# Patient Record
Sex: Male | Born: 1945
Health system: Southern US, Community
[De-identification: ages and names within clinical notes are randomized; demographics above are authoritative.]

## PROBLEM LIST (undated history)

## (undated) DIAGNOSIS — I2510 Atherosclerotic heart disease of native coronary artery without angina pectoris: Secondary | ICD-10-CM

## (undated) DIAGNOSIS — D649 Anemia, unspecified: Secondary | ICD-10-CM

## (undated) DIAGNOSIS — F1010 Alcohol abuse, uncomplicated: Secondary | ICD-10-CM

## (undated) DIAGNOSIS — F32A Depression, unspecified: Secondary | ICD-10-CM

## (undated) DIAGNOSIS — R51 Headache: Secondary | ICD-10-CM

## (undated) DIAGNOSIS — I471 Supraventricular tachycardia, unspecified: Secondary | ICD-10-CM

## (undated) DIAGNOSIS — M1990 Unspecified osteoarthritis, unspecified site: Secondary | ICD-10-CM

## (undated) DIAGNOSIS — R519 Headache, unspecified: Secondary | ICD-10-CM

## (undated) DIAGNOSIS — I77819 Aortic ectasia, unspecified site: Secondary | ICD-10-CM

## (undated) DIAGNOSIS — K635 Polyp of colon: Secondary | ICD-10-CM

## (undated) DIAGNOSIS — U071 COVID-19: Secondary | ICD-10-CM

## (undated) DIAGNOSIS — B1920 Unspecified viral hepatitis C without hepatic coma: Secondary | ICD-10-CM

## (undated) DIAGNOSIS — F4310 Post-traumatic stress disorder, unspecified: Secondary | ICD-10-CM

## (undated) DIAGNOSIS — K2970 Gastritis, unspecified, without bleeding: Secondary | ICD-10-CM

## (undated) DIAGNOSIS — I491 Atrial premature depolarization: Secondary | ICD-10-CM

## (undated) DIAGNOSIS — F319 Bipolar disorder, unspecified: Secondary | ICD-10-CM

## (undated) DIAGNOSIS — K861 Other chronic pancreatitis: Secondary | ICD-10-CM

## (undated) DIAGNOSIS — I509 Heart failure, unspecified: Secondary | ICD-10-CM

## (undated) DIAGNOSIS — I77810 Thoracic aortic ectasia: Secondary | ICD-10-CM

## (undated) DIAGNOSIS — F0390 Unspecified dementia without behavioral disturbance: Secondary | ICD-10-CM

## (undated) DIAGNOSIS — K219 Gastro-esophageal reflux disease without esophagitis: Secondary | ICD-10-CM

## (undated) DIAGNOSIS — C3490 Malignant neoplasm of unspecified part of unspecified bronchus or lung: Secondary | ICD-10-CM

## (undated) DIAGNOSIS — J449 Chronic obstructive pulmonary disease, unspecified: Secondary | ICD-10-CM

## (undated) DIAGNOSIS — K429 Umbilical hernia without obstruction or gangrene: Secondary | ICD-10-CM

## (undated) DIAGNOSIS — Z981 Arthrodesis status: Secondary | ICD-10-CM

## (undated) DIAGNOSIS — J349 Unspecified disorder of nose and nasal sinuses: Secondary | ICD-10-CM

## (undated) DIAGNOSIS — I5032 Chronic diastolic (congestive) heart failure: Secondary | ICD-10-CM

## (undated) DIAGNOSIS — F1990 Other psychoactive substance use, unspecified, uncomplicated: Secondary | ICD-10-CM

## (undated) DIAGNOSIS — K7460 Unspecified cirrhosis of liver: Secondary | ICD-10-CM

## (undated) DIAGNOSIS — F329 Major depressive disorder, single episode, unspecified: Secondary | ICD-10-CM

## (undated) DIAGNOSIS — N138 Other obstructive and reflux uropathy: Secondary | ICD-10-CM

## (undated) DIAGNOSIS — N529 Male erectile dysfunction, unspecified: Secondary | ICD-10-CM

## (undated) DIAGNOSIS — E785 Hyperlipidemia, unspecified: Secondary | ICD-10-CM

## (undated) DIAGNOSIS — N401 Enlarged prostate with lower urinary tract symptoms: Secondary | ICD-10-CM

## (undated) DIAGNOSIS — J189 Pneumonia, unspecified organism: Secondary | ICD-10-CM

## (undated) DIAGNOSIS — R0600 Dyspnea, unspecified: Secondary | ICD-10-CM

## (undated) DIAGNOSIS — R933 Abnormal findings on diagnostic imaging of other parts of digestive tract: Secondary | ICD-10-CM

## (undated) DIAGNOSIS — C61 Malignant neoplasm of prostate: Secondary | ICD-10-CM

## (undated) DIAGNOSIS — I340 Nonrheumatic mitral (valve) insufficiency: Secondary | ICD-10-CM

## (undated) DIAGNOSIS — K4021 Bilateral inguinal hernia, without obstruction or gangrene, recurrent: Secondary | ICD-10-CM

## (undated) DIAGNOSIS — K859 Acute pancreatitis without necrosis or infection, unspecified: Secondary | ICD-10-CM

## (undated) DIAGNOSIS — I10 Essential (primary) hypertension: Secondary | ICD-10-CM

## (undated) DIAGNOSIS — S2239XA Fracture of one rib, unspecified side, initial encounter for closed fracture: Secondary | ICD-10-CM

## (undated) DIAGNOSIS — N3941 Urge incontinence: Secondary | ICD-10-CM

## (undated) DIAGNOSIS — K5790 Diverticulosis of intestine, part unspecified, without perforation or abscess without bleeding: Secondary | ICD-10-CM

## (undated) DIAGNOSIS — K5792 Diverticulitis of intestine, part unspecified, without perforation or abscess without bleeding: Secondary | ICD-10-CM

## (undated) DIAGNOSIS — C801 Malignant (primary) neoplasm, unspecified: Secondary | ICD-10-CM

## (undated) HISTORY — DX: Diverticulitis of intestine, part unspecified, without perforation or abscess without bleeding: K57.92

## (undated) HISTORY — DX: Unspecified disorder of nose and nasal sinuses: J34.9

## (undated) HISTORY — DX: Malignant neoplasm of prostate: C61

## (undated) HISTORY — DX: Supraventricular tachycardia, unspecified: I47.10

## (undated) HISTORY — DX: Polyp of colon: K63.5

## (undated) HISTORY — DX: Urge incontinence: N39.41

## (undated) HISTORY — DX: Heart failure, unspecified: I50.9

## (undated) HISTORY — DX: Male erectile dysfunction, unspecified: N52.9

## (undated) HISTORY — DX: Benign prostatic hyperplasia with lower urinary tract symptoms: N40.1

## (undated) HISTORY — DX: Abnormal findings on diagnostic imaging of other parts of digestive tract: R93.3

## (undated) HISTORY — DX: Other obstructive and reflux uropathy: N13.8

## (undated) HISTORY — DX: COVID-19: U07.1

## (undated) HISTORY — DX: Malignant neoplasm of unspecified part of unspecified bronchus or lung: C34.90

## (undated) HISTORY — DX: Thoracic aortic ectasia: I77.810

## (undated) HISTORY — DX: Acute pancreatitis without necrosis or infection, unspecified: K85.90

## (undated) HISTORY — DX: Bipolar disorder, unspecified: F31.9

## (undated) HISTORY — DX: Atrial premature depolarization: I49.1

## (undated) HISTORY — PX: TONSILLECTOMY: SUR1361

## (undated) HISTORY — DX: Bilateral inguinal hernia, without obstruction or gangrene, recurrent: K40.21

## (undated) HISTORY — DX: Gastritis, unspecified, without bleeding: K29.70

## (undated) HISTORY — DX: Aortic ectasia, unspecified site: I77.819

## (undated) HISTORY — DX: Supraventricular tachycardia: I47.1

## (undated) HISTORY — DX: Post-traumatic stress disorder, unspecified: F43.10

## (undated) HISTORY — DX: Pneumonia, unspecified organism: J18.9

## (undated) HISTORY — PX: LUMBAR FUSION: SHX111

## (undated) HISTORY — DX: Umbilical hernia without obstruction or gangrene: K42.9

## (undated) HISTORY — DX: Chronic diastolic (congestive) heart failure: I50.32

## (undated) HISTORY — DX: Fracture of one rib, unspecified side, initial encounter for closed fracture: S22.39XA

---

## 1998-02-16 ENCOUNTER — Ambulatory Visit (HOSPITAL_COMMUNITY): Admission: RE | Admit: 1998-02-16 | Discharge: 1998-02-16 | Payer: Self-pay | Admitting: Cardiovascular Disease

## 1998-03-08 ENCOUNTER — Ambulatory Visit (HOSPITAL_COMMUNITY): Admission: RE | Admit: 1998-03-08 | Discharge: 1998-03-08 | Payer: Self-pay | Admitting: Gastroenterology

## 2001-11-18 ENCOUNTER — Emergency Department (HOSPITAL_COMMUNITY): Admission: EM | Admit: 2001-11-18 | Discharge: 2001-11-18 | Payer: Self-pay | Admitting: Emergency Medicine

## 2002-02-12 ENCOUNTER — Encounter: Payer: Self-pay | Admitting: Emergency Medicine

## 2002-02-12 ENCOUNTER — Emergency Department (HOSPITAL_COMMUNITY): Admission: EM | Admit: 2002-02-12 | Discharge: 2002-02-12 | Payer: Self-pay | Admitting: Emergency Medicine

## 2002-09-06 ENCOUNTER — Encounter: Payer: Self-pay | Admitting: Emergency Medicine

## 2002-09-06 ENCOUNTER — Emergency Department (HOSPITAL_COMMUNITY): Admission: EM | Admit: 2002-09-06 | Discharge: 2002-09-07 | Payer: Self-pay | Admitting: Emergency Medicine

## 2004-06-27 ENCOUNTER — Emergency Department: Payer: Self-pay | Admitting: Emergency Medicine

## 2004-07-28 HISTORY — PX: CORONARY ANGIOPLASTY: SHX604

## 2004-07-28 HISTORY — PX: CARDIAC CATHETERIZATION: SHX172

## 2004-07-28 HISTORY — PX: CORONARY STENT PLACEMENT: SHX1402

## 2004-12-05 ENCOUNTER — Emergency Department: Payer: Self-pay | Admitting: Emergency Medicine

## 2005-02-21 ENCOUNTER — Ambulatory Visit: Payer: Self-pay | Admitting: Cardiology

## 2005-02-24 ENCOUNTER — Inpatient Hospital Stay: Payer: Self-pay | Admitting: Cardiology

## 2005-02-24 ENCOUNTER — Other Ambulatory Visit: Payer: Self-pay

## 2005-03-31 ENCOUNTER — Emergency Department: Payer: Self-pay | Admitting: Emergency Medicine

## 2005-04-22 ENCOUNTER — Ambulatory Visit: Payer: Self-pay | Admitting: Family Medicine

## 2005-08-05 ENCOUNTER — Emergency Department: Payer: Self-pay | Admitting: Unknown Physician Specialty

## 2005-08-21 ENCOUNTER — Ambulatory Visit: Payer: Self-pay | Admitting: Gastroenterology

## 2005-11-12 ENCOUNTER — Ambulatory Visit: Payer: Self-pay | Admitting: Psychiatry

## 2005-11-12 ENCOUNTER — Inpatient Hospital Stay (HOSPITAL_COMMUNITY): Admission: RE | Admit: 2005-11-12 | Discharge: 2005-11-20 | Payer: Self-pay | Admitting: Psychiatry

## 2010-01-28 ENCOUNTER — Emergency Department: Payer: Self-pay | Admitting: Emergency Medicine

## 2010-03-06 ENCOUNTER — Inpatient Hospital Stay: Payer: Self-pay | Admitting: Internal Medicine

## 2010-04-25 ENCOUNTER — Emergency Department: Payer: Self-pay | Admitting: Emergency Medicine

## 2010-05-07 ENCOUNTER — Ambulatory Visit: Payer: Self-pay | Admitting: Emergency Medicine

## 2010-06-07 ENCOUNTER — Ambulatory Visit: Payer: Self-pay | Admitting: Cardiovascular Disease

## 2010-07-18 ENCOUNTER — Ambulatory Visit: Payer: Self-pay | Admitting: Pain Medicine

## 2011-07-04 ENCOUNTER — Emergency Department: Payer: Self-pay | Admitting: Emergency Medicine

## 2011-07-29 HISTORY — PX: OTHER SURGICAL HISTORY: SHX169

## 2011-12-16 ENCOUNTER — Ambulatory Visit: Payer: Self-pay | Admitting: Ophthalmology

## 2011-12-16 DIAGNOSIS — I499 Cardiac arrhythmia, unspecified: Secondary | ICD-10-CM

## 2012-01-06 ENCOUNTER — Ambulatory Visit: Payer: Self-pay | Admitting: Ophthalmology

## 2013-06-15 ENCOUNTER — Ambulatory Visit: Payer: Self-pay | Admitting: Ophthalmology

## 2013-06-15 DIAGNOSIS — I10 Essential (primary) hypertension: Secondary | ICD-10-CM

## 2013-06-28 ENCOUNTER — Ambulatory Visit: Payer: Self-pay | Admitting: Ophthalmology

## 2013-07-28 HISTORY — PX: HERNIA REPAIR: SHX51

## 2014-07-27 ENCOUNTER — Emergency Department (HOSPITAL_COMMUNITY): Payer: Medicare PPO

## 2014-07-27 ENCOUNTER — Encounter (HOSPITAL_COMMUNITY): Payer: Self-pay

## 2014-07-27 ENCOUNTER — Inpatient Hospital Stay (HOSPITAL_COMMUNITY)
Admission: EM | Admit: 2014-07-27 | Discharge: 2014-07-30 | DRG: 917 | Disposition: A | Discharge status: Home / Self Care | Payer: Medicare PPO | Attending: Internal Medicine | Admitting: Internal Medicine

## 2014-07-27 DIAGNOSIS — E872 Acidosis, unspecified: Secondary | ICD-10-CM

## 2014-07-27 DIAGNOSIS — I493 Ventricular premature depolarization: Secondary | ICD-10-CM | POA: Diagnosis present

## 2014-07-27 DIAGNOSIS — Z79899 Other long term (current) drug therapy: Secondary | ICD-10-CM | POA: Diagnosis not present

## 2014-07-27 DIAGNOSIS — E875 Hyperkalemia: Secondary | ICD-10-CM | POA: Diagnosis present

## 2014-07-27 DIAGNOSIS — F1910 Other psychoactive substance abuse, uncomplicated: Secondary | ICD-10-CM | POA: Diagnosis present

## 2014-07-27 DIAGNOSIS — I248 Other forms of acute ischemic heart disease: Secondary | ICD-10-CM | POA: Diagnosis present

## 2014-07-27 DIAGNOSIS — Z955 Presence of coronary angioplasty implant and graft: Secondary | ICD-10-CM

## 2014-07-27 DIAGNOSIS — R404 Transient alteration of awareness: Secondary | ICD-10-CM | POA: Diagnosis not present

## 2014-07-27 DIAGNOSIS — G43909 Migraine, unspecified, not intractable, without status migrainosus: Secondary | ICD-10-CM | POA: Diagnosis not present

## 2014-07-27 DIAGNOSIS — Z981 Arthrodesis status: Secondary | ICD-10-CM

## 2014-07-27 DIAGNOSIS — N179 Acute kidney failure, unspecified: Secondary | ICD-10-CM | POA: Diagnosis present

## 2014-07-27 DIAGNOSIS — T401X1A Poisoning by heroin, accidental (unintentional), initial encounter: Principal | ICD-10-CM | POA: Diagnosis present

## 2014-07-27 DIAGNOSIS — M6282 Rhabdomyolysis: Secondary | ICD-10-CM | POA: Diagnosis present

## 2014-07-27 DIAGNOSIS — F32A Depression, unspecified: Secondary | ICD-10-CM

## 2014-07-27 DIAGNOSIS — J9601 Acute respiratory failure with hypoxia: Secondary | ICD-10-CM | POA: Diagnosis present

## 2014-07-27 DIAGNOSIS — I2510 Atherosclerotic heart disease of native coronary artery without angina pectoris: Secondary | ICD-10-CM | POA: Diagnosis present

## 2014-07-27 DIAGNOSIS — G9341 Metabolic encephalopathy: Secondary | ICD-10-CM | POA: Diagnosis present

## 2014-07-27 DIAGNOSIS — Z87891 Personal history of nicotine dependence: Secondary | ICD-10-CM

## 2014-07-27 DIAGNOSIS — F10180 Alcohol abuse with alcohol-induced anxiety disorder: Secondary | ICD-10-CM | POA: Diagnosis present

## 2014-07-27 DIAGNOSIS — R4189 Other symptoms and signs involving cognitive functions and awareness: Secondary | ICD-10-CM | POA: Insufficient documentation

## 2014-07-27 DIAGNOSIS — F329 Major depressive disorder, single episode, unspecified: Secondary | ICD-10-CM | POA: Diagnosis present

## 2014-07-27 DIAGNOSIS — K219 Gastro-esophageal reflux disease without esophagitis: Secondary | ICD-10-CM

## 2014-07-27 DIAGNOSIS — J690 Pneumonitis due to inhalation of food and vomit: Secondary | ICD-10-CM | POA: Diagnosis present

## 2014-07-27 DIAGNOSIS — Z8249 Family history of ischemic heart disease and other diseases of the circulatory system: Secondary | ICD-10-CM

## 2014-07-27 DIAGNOSIS — J449 Chronic obstructive pulmonary disease, unspecified: Secondary | ICD-10-CM

## 2014-07-27 DIAGNOSIS — R7989 Other specified abnormal findings of blood chemistry: Secondary | ICD-10-CM

## 2014-07-27 DIAGNOSIS — R079 Chest pain, unspecified: Secondary | ICD-10-CM

## 2014-07-27 DIAGNOSIS — R778 Other specified abnormalities of plasma proteins: Secondary | ICD-10-CM

## 2014-07-27 DIAGNOSIS — F419 Anxiety disorder, unspecified: Secondary | ICD-10-CM

## 2014-07-27 HISTORY — DX: Depression, unspecified: F32.A

## 2014-07-27 HISTORY — DX: Atherosclerotic heart disease of native coronary artery without angina pectoris: I25.10

## 2014-07-27 HISTORY — DX: Chronic obstructive pulmonary disease, unspecified: J44.9

## 2014-07-27 HISTORY — DX: Alcohol abuse, uncomplicated: F10.10

## 2014-07-27 HISTORY — DX: Arthrodesis status: Z98.1

## 2014-07-27 HISTORY — DX: Other psychoactive substance use, unspecified, uncomplicated: F19.90

## 2014-07-27 HISTORY — DX: Major depressive disorder, single episode, unspecified: F32.9

## 2014-07-27 LAB — LACTIC ACID, PLASMA
LACTIC ACID, VENOUS: 8 mmol/L — AB (ref 0.5–2.2)
LACTIC ACID, VENOUS: 1.9 mmol/L (ref 0.5–2.2)
LACTIC ACID, VENOUS: 1.4 mmol/L (ref 0.5–2.2)
Lactic Acid, Venous: 1.1 mmol/L (ref 0.5–2.2)
Lactic Acid, Venous: 1.7 mmol/L (ref 0.5–2.2)

## 2014-07-27 LAB — POCT I-STAT 3, ART BLOOD GAS (G3+)
Acid-base deficit: 6 mmol/L — ABNORMAL HIGH (ref 0.0–2.0)
Bicarbonate: 21.3 mEq/L (ref 20.0–24.0)
O2 Saturation: 92 %
PH ART: 7.236 — AB (ref 7.350–7.450)
TCO2: 23 mmol/L (ref 0–100)
pCO2 arterial: 50.3 mmHg — ABNORMAL HIGH (ref 35.0–45.0)
pO2, Arterial: 78 mmHg — ABNORMAL LOW (ref 80.0–100.0)

## 2014-07-27 LAB — CBC
HCT: 39.9 % (ref 39.0–52.0)
Hemoglobin: 12.9 g/dL — ABNORMAL LOW (ref 13.0–17.0)
MCH: 32.7 pg (ref 26.0–34.0)
MCHC: 32.3 g/dL (ref 30.0–36.0)
MCV: 101.3 fL — AB (ref 78.0–100.0)
PLATELETS: 271 10*3/uL (ref 150–400)
RBC: 3.94 MIL/uL — ABNORMAL LOW (ref 4.22–5.81)
RDW: 14.3 % (ref 11.5–15.5)
WBC: 15.4 10*3/uL — ABNORMAL HIGH (ref 4.0–10.5)

## 2014-07-27 LAB — COMPREHENSIVE METABOLIC PANEL
ALBUMIN: 4.2 g/dL (ref 3.5–5.2)
ALK PHOS: 112 U/L (ref 39–117)
ALT: 23 U/L (ref 0–53)
ANION GAP: 14 (ref 5–15)
AST: 47 U/L — ABNORMAL HIGH (ref 0–37)
BILIRUBIN TOTAL: 0.3 mg/dL (ref 0.3–1.2)
BUN: 16 mg/dL (ref 6–23)
CALCIUM: 9 mg/dL (ref 8.4–10.5)
CHLORIDE: 106 meq/L (ref 96–112)
CO2: 22 mmol/L (ref 19–32)
Creatinine, Ser: 2.78 mg/dL — ABNORMAL HIGH (ref 0.50–1.35)
GFR calc Af Amer: 26 mL/min — ABNORMAL LOW (ref >90)
GFR, EST NON AFRICAN AMERICAN: 22 mL/min — AB (ref >90)
Glucose, Bld: 143 mg/dL — ABNORMAL HIGH (ref 70–99)
Potassium: 5.6 mmol/L — ABNORMAL HIGH (ref 3.5–5.1)
Sodium: 142 mmol/L (ref 135–145)
Total Protein: 7.9 g/dL (ref 6.0–8.3)

## 2014-07-27 LAB — URINALYSIS, ROUTINE W REFLEX MICROSCOPIC
Bilirubin Urine: NEGATIVE
Glucose, UA: NEGATIVE mg/dL
Ketones, ur: NEGATIVE mg/dL
Leukocytes, UA: NEGATIVE
NITRITE: NEGATIVE
PH: 5 (ref 5.0–8.0)
Protein, ur: 100 mg/dL — AB
Specific Gravity, Urine: 1.015 (ref 1.005–1.030)
UROBILINOGEN UA: 0.2 mg/dL (ref 0.0–1.0)

## 2014-07-27 LAB — CBC WITH DIFFERENTIAL/PLATELET
Basophils Absolute: 0 10*3/uL (ref 0.0–0.1)
Basophils Relative: 0 % (ref 0–1)
Eosinophils Absolute: 0 10*3/uL (ref 0.0–0.7)
Eosinophils Relative: 0 % (ref 0–5)
HCT: 42.2 % (ref 39.0–52.0)
Hemoglobin: 13.4 g/dL (ref 13.0–17.0)
LYMPHS ABS: 1.6 10*3/uL (ref 0.7–4.0)
Lymphocytes Relative: 9 % — ABNORMAL LOW (ref 12–46)
MCH: 32.5 pg (ref 26.0–34.0)
MCHC: 31.8 g/dL (ref 30.0–36.0)
MCV: 102.4 fL — ABNORMAL HIGH (ref 78.0–100.0)
MONO ABS: 1 10*3/uL (ref 0.1–1.0)
Monocytes Relative: 5 % (ref 3–12)
NEUTROS ABS: 16.3 10*3/uL — AB (ref 1.7–7.7)
Neutrophils Relative %: 86 % — ABNORMAL HIGH (ref 43–77)
PLATELETS: 281 10*3/uL (ref 150–400)
RBC: 4.12 MIL/uL — AB (ref 4.22–5.81)
RDW: 14.6 % (ref 11.5–15.5)
WBC: 19 10*3/uL — ABNORMAL HIGH (ref 4.0–10.5)

## 2014-07-27 LAB — BASIC METABOLIC PANEL
ANION GAP: 4 — AB (ref 5–15)
Anion gap: 10 (ref 5–15)
Anion gap: 9 (ref 5–15)
BUN: 22 mg/dL (ref 6–23)
BUN: 22 mg/dL (ref 6–23)
BUN: 23 mg/dL (ref 6–23)
CALCIUM: 8.4 mg/dL (ref 8.4–10.5)
CALCIUM: 8.3 mg/dL — AB (ref 8.4–10.5)
CALCIUM: 8.3 mg/dL — AB (ref 8.4–10.5)
CHLORIDE: 109 meq/L (ref 96–112)
CHLORIDE: 110 meq/L (ref 96–112)
CO2: 21 mmol/L (ref 19–32)
CO2: 23 mmol/L (ref 19–32)
CO2: 24 mmol/L (ref 19–32)
CREATININE: 1.96 mg/dL — AB (ref 0.50–1.35)
Chloride: 108 mEq/L (ref 96–112)
Creatinine, Ser: 2.58 mg/dL — ABNORMAL HIGH (ref 0.50–1.35)
Creatinine, Ser: 2.38 mg/dL — ABNORMAL HIGH (ref 0.50–1.35)
GFR calc non Af Amer: 34 mL/min — ABNORMAL LOW (ref >90)
GFR, EST AFRICAN AMERICAN: 28 mL/min — AB (ref >90)
GFR, EST AFRICAN AMERICAN: 31 mL/min — AB (ref >90)
GFR, EST AFRICAN AMERICAN: 39 mL/min — AB (ref >90)
GFR, EST NON AFRICAN AMERICAN: 24 mL/min — AB (ref >90)
GFR, EST NON AFRICAN AMERICAN: 27 mL/min — AB (ref >90)
GLUCOSE: 107 mg/dL — AB (ref 70–99)
Glucose, Bld: 103 mg/dL — ABNORMAL HIGH (ref 70–99)
Glucose, Bld: 103 mg/dL — ABNORMAL HIGH (ref 70–99)
POTASSIUM: 4.6 mmol/L (ref 3.5–5.1)
Potassium: 4.9 mmol/L (ref 3.5–5.1)
Potassium: 4 mmol/L (ref 3.5–5.1)
SODIUM: 140 mmol/L (ref 135–145)
SODIUM: 140 mmol/L (ref 135–145)
SODIUM: 138 mmol/L (ref 135–145)

## 2014-07-27 LAB — TROPONIN I
TROPONIN I: 0.54 ng/mL — AB (ref <0.031)
Troponin I: 0.31 ng/mL — ABNORMAL HIGH (ref <0.031)
Troponin I: 0.65 ng/mL (ref <0.031)

## 2014-07-27 LAB — RAPID URINE DRUG SCREEN, HOSP PERFORMED
Amphetamines: NOT DETECTED
BARBITURATES: NOT DETECTED
Benzodiazepines: POSITIVE — AB
Cocaine: NOT DETECTED
Opiates: POSITIVE — AB
Tetrahydrocannabinol: POSITIVE — AB

## 2014-07-27 LAB — URINE MICROSCOPIC-ADD ON

## 2014-07-27 LAB — GLUCOSE, CAPILLARY
GLUCOSE-CAPILLARY: 82 mg/dL (ref 70–99)
GLUCOSE-CAPILLARY: 97 mg/dL (ref 70–99)
Glucose-Capillary: 95 mg/dL (ref 70–99)
Glucose-Capillary: 108 mg/dL — ABNORMAL HIGH (ref 70–99)

## 2014-07-27 LAB — HIV ANTIBODY (ROUTINE TESTING W REFLEX): HIV: NONREACTIVE

## 2014-07-27 LAB — I-STAT ARTERIAL BLOOD GAS, ED
Acid-base deficit: 8 mmol/L — ABNORMAL HIGH (ref 0.0–2.0)
BICARBONATE: 21.3 meq/L (ref 20.0–24.0)
O2 Saturation: 88 %
PH ART: 7.178 — AB (ref 7.350–7.450)
PO2 ART: 69 mmHg — AB (ref 80.0–100.0)
Patient temperature: 98.6
TCO2: 23 mmol/L (ref 0–100)
pCO2 arterial: 57.3 mmHg (ref 35.0–45.0)

## 2014-07-27 LAB — ACETAMINOPHEN LEVEL

## 2014-07-27 LAB — CREATININE, SERUM
CREATININE: 2.74 mg/dL — AB (ref 0.50–1.35)
GFR calc Af Amer: 26 mL/min — ABNORMAL LOW (ref >90)
GFR calc non Af Amer: 22 mL/min — ABNORMAL LOW (ref >90)

## 2014-07-27 LAB — I-STAT TROPONIN, ED: TROPONIN I, POC: 0.13 ng/mL — AB (ref 0.00–0.08)

## 2014-07-27 LAB — ETHANOL: Alcohol, Ethyl (B): <5 mg/dL (ref 0–9)

## 2014-07-27 LAB — CK: Total CK: 4374 U/L — ABNORMAL HIGH (ref 7–232)

## 2014-07-27 LAB — MRSA PCR SCREENING: MRSA by PCR: NEGATIVE

## 2014-07-27 LAB — PROCALCITONIN: Procalcitonin: 1.61 ng/mL

## 2014-07-27 LAB — SALICYLATE LEVEL: Salicylate Lvl: <4 mg/dL (ref 2.8–20.0)

## 2014-07-27 MED ORDER — SODIUM CHLORIDE 0.9 % IV SOLN
1.0000 g | Freq: Once | INTRAVENOUS | Status: AC
  Administered 2014-07-27: 1 g via INTRAVENOUS
  Filled 2014-07-27: qty 10

## 2014-07-27 MED ORDER — INSULIN ASPART 100 UNIT/ML ~~LOC~~ SOLN: 10.0000 [IU] | Freq: Once | SUBCUTANEOUS | Status: DC

## 2014-07-27 MED ORDER — DEXTROSE 5 % IV SOLN
1.0000 g | Freq: Once | INTRAVENOUS | Status: AC
  Administered 2014-07-27: 1 g via INTRAVENOUS
  Filled 2014-07-27: qty 10

## 2014-07-27 MED ORDER — LEVOFLOXACIN IN D5W 750 MG/150ML IV SOLN
750.0000 mg | Freq: Once | INTRAVENOUS | Status: DC
  Filled 2014-07-27: qty 150

## 2014-07-27 MED ORDER — THIAMINE HCL 100 MG/ML IJ SOLN
100.0000 mg | Freq: Every day | INTRAMUSCULAR | Status: DC
  Administered 2014-07-27 – 2014-07-29 (×3): 100 mg via INTRAVENOUS
  Filled 2014-07-27 (×4): qty 1

## 2014-07-27 MED ORDER — ACETAMINOPHEN 500 MG PO TABS
1000.0000 mg | ORAL_TABLET | Freq: Once | ORAL | Status: AC
  Administered 2014-07-27: 1000 mg via ORAL

## 2014-07-27 MED ORDER — PANTOPRAZOLE SODIUM 40 MG IV SOLR
40.0000 mg | INTRAVENOUS | Status: DC
  Administered 2014-07-27 – 2014-07-28 (×2): 40 mg via INTRAVENOUS
  Filled 2014-07-27 (×3): qty 40

## 2014-07-27 MED ORDER — PIPERACILLIN-TAZOBACTAM 3.375 G IVPB
3.3750 g | Freq: Three times a day (TID) | INTRAVENOUS | Status: DC
  Administered 2014-07-27 – 2014-07-28 (×3): 3.375 g via INTRAVENOUS
  Filled 2014-07-27 (×5): qty 50

## 2014-07-27 MED ORDER — SODIUM CHLORIDE 0.9 % IV SOLN: 250.0000 mL | INTRAVENOUS | Status: DC | PRN

## 2014-07-27 MED ORDER — PIPERACILLIN-TAZOBACTAM 3.375 G IVPB 30 MIN
3.3750 g | Freq: Once | INTRAVENOUS | Status: AC
  Administered 2014-07-27: 3.375 g via INTRAVENOUS
  Filled 2014-07-27: qty 50

## 2014-07-27 MED ORDER — SODIUM CHLORIDE 0.9 % IV BOLUS (SEPSIS)
1000.0000 mL | Freq: Once | INTRAVENOUS | Status: AC
  Administered 2014-07-27: 1000 mL via INTRAVENOUS

## 2014-07-27 MED ORDER — DEXTROSE 50 % IV SOLN: 50.0000 mL | Freq: Once | INTRAVENOUS | Status: DC

## 2014-07-27 MED ORDER — CETYLPYRIDINIUM CHLORIDE 0.05 % MT LIQD
7.0000 mL | Freq: Two times a day (BID) | OROMUCOSAL | Status: DC
  Administered 2014-07-27 – 2014-07-29 (×5): 7 mL via OROMUCOSAL

## 2014-07-27 MED ORDER — SODIUM CHLORIDE 0.9 % IV BOLUS (SEPSIS): 1000.0000 mL | Freq: Once | INTRAVENOUS | Status: DC

## 2014-07-27 MED ORDER — SODIUM CHLORIDE 0.9 % IV SOLN
INTRAVENOUS | Status: DC
  Administered 2014-07-27 (×2): via INTRAVENOUS

## 2014-07-27 MED ORDER — FOLIC ACID 5 MG/ML IJ SOLN
1.0000 mg | Freq: Every day | INTRAMUSCULAR | Status: DC
  Administered 2014-07-27 – 2014-07-29 (×3): 1 mg via INTRAVENOUS
  Filled 2014-07-27 (×3): qty 0.2

## 2014-07-27 MED ORDER — HEPARIN SODIUM (PORCINE) 5000 UNIT/ML IJ SOLN
5000.0000 [IU] | Freq: Three times a day (TID) | INTRAMUSCULAR | Status: DC
  Administered 2014-07-27 – 2014-07-30 (×9): 5000 [IU] via SUBCUTANEOUS
  Filled 2014-07-27 (×11): qty 1

## 2014-07-27 NOTE — Care Management Note (Addendum)
    Page 1 of 1   07/28/2014     2:34:42 PM CARE MANAGEMENT NOTE 07/28/2014  Patient:  Christopher Orr, Christopher Orr   Account Number:  [id number]  Date Initiated:  07/27/2014  Documentation initiated by:  Elissa Hefty  Subjective/Objective Assessment:   adm w resp distress     Action/Plan:   lives at home pta   In-house referral  Clinical Social Worker      DC Forensic scientist  CM consult      Medicare Important Message given?   (If response is "NO", the following Medicare IM given date fields will be blank) Date Medicare IM given:   Medicare IM given by:   Date Additional Medicare IM given:   Additional Medicare IM given by:    Per UR Regulation:  Reviewed for med. necessity/level of care/duration of stay  Comments:  07/28/14 [place] Per RN, pt requested someone talk with him about his insurance.  Per pt, his new insurance went into effect this date, produced a letter verifying enrollment in [place].  Dtr will take letter to admitting so that correct payor will be entered into system.  Noted that medicaid was listed as secondary but pt states he no longer has MCD.  Dtr will also provide that information to admitting.

## 2014-07-27 NOTE — ED Notes (Signed)
The pt has stated his name and stated that he injected heroin tonight.

## 2014-07-27 NOTE — ED Notes (Signed)
Report called to 2100.

## 2014-07-27 NOTE — ED Notes (Signed)
Dr. Stark Jock notified of I stat troponin results

## 2014-07-27 NOTE — ED Notes (Signed)
Daughter at bedside , states she found him in his car tonight. Patient has a history of drug and alcohol abuse. Patient is currently alert  States he used heroin tonight. Daughter [name] has patient belongings pants, shirt, wallet no money, shoes and keys, no glasses with patient upon arrival. Daughter states his cell phone is home.

## 2014-07-27 NOTE — ED Notes (Signed)
Dr Stark Jock has ordered a liter of fluid wo

## 2014-07-27 NOTE — ED Provider Notes (Signed)
CSN: [id number]     Arrival date & time 07/27/14  0212 History   None    This chart was scribed for Christopher Speak, MD by Forrestine Him, ED Scribe. This patient was seen in room Christopher Orr and the patient's care was started 2:13 AM.   No chief complaint on file.  Patient is a 68 y.o. male presenting with altered mental status. The history is provided by the patient. No language interpreter was used.  Altered Mental Status Presenting symptoms: behavior changes and unresponsiveness   Severity:  Unable to specify Most recent episode:  Today Episode history:  Unable to specify Timing:  Constant Progression:  Unchanged Chronicity:  New   LEVEL 5 CAVEAT DUE TO UNRESPONSIVENESS   HPI Comments: Christopher Orr is a 68 y.o. male with a PMHx of polysubstance abuse who presents to the Emergency Department here after being found unresponsive in a car just prior to arrival. Per EMS, pts daughter was looking for pt and found him in the car. At that time, all windows in car were completely fogged. No evidence of alcohol consumption or illicit drug use found in the vehicle. Copious amount of sputum in throat, approximately 50 CC of mucous suctioned on the scene. In addition pt was found breathing approximately 4 times per minute. He was given IV Narcan.  No past medical history on file. No past surgical history on file. No family history on file. History  Substance Use Topics  . Smoking status: Not on file  . Smokeless tobacco: Not on file  . Alcohol Use: Not on file    Review of Systems  Unable to perform ROS: Patient unresponsive      Allergies  Review of patient's allergies indicates not on file.  Home Medications   Prior to Admission medications   Not on File   Triage Vitals: There were no vitals taken for this visit.   Physical Exam  Constitutional: He is oriented to person, place, and time. He appears well-developed and well-nourished.  Pt is 68 year old male who appears older  than stated age. He is moaning and not answering questions or following commands.  HENT:  Head: Normocephalic and atraumatic.  Eyes: EOM are normal.  Neck: Normal range of motion.  Cardiovascular: Normal rate, regular rhythm, normal heart sounds and intact distal pulses.   No murmur heard. Pulmonary/Chest: Effort normal and breath sounds normal. No respiratory distress. He has no wheezes. He has no rales.  Abdominal: Soft. He exhibits no distension. There is no tenderness.  Musculoskeletal: Normal range of motion. He exhibits no edema.  Neurological: He is alert and oriented to person, place, and time.  Pt is awake and moaning. He appears tremulous but moves all extremities. Does not follow commands appropriately  remainder of neurologic exam is difficult due to severity of medical condition.  Skin: Skin is warm and dry.  Psychiatric: He has a normal mood and affect. Judgment normal.  Nursing note and vitals reviewed.   ED Course  Procedures (including critical care time)  DIAGNOSTIC STUDIES: Oxygen Saturation is 86% on oxygen by nrb, low by my interpretation.    COORDINATION OF CARE: 2:22 AM-Discussed treatment plan with pt at bedside and pt agreed to plan.     Labs Review Labs Reviewed - No data to display  Imaging Review No results found.   Date: 07/27/2014  Rate: 109  Rhythm: normal sinus rhythm  QRS Axis: left  Intervals: normal  ST/T Wave abnormalities: nonspecific T wave  changes  Conduction Disutrbances:none  Narrative Interpretation:   Old EKG Reviewed: none available    MDM   Final diagnoses:  None    Patient brought by EMS after being found unresponsive in his car by his daughter. Patient has a history of drug abuse and admits to injecting heroin earlier this evening. He was given Narcan by EMS with little improvement in his mental status. He arrived here with snoring respirations and hypotensive. He was protecting his airway and was observed pending  the results of his workup.  His chest x-ray reveals what appears to be an aspiration pneumonitis and tox screen is positive for opiates, benzos, and marijuana. His found to have a mildly bumped troponin, significance of which I am uncertain. This may be more of a demand ischemia than a true acute coronary syndrome. His EKG does not show any acute changes. He underwent imaging of his head to rule out stroke which was negative.  In consultation with Dr. Donette Larry with critical care, an ABG was obtained which reveals a respiratory acidosis. Patient will be evaluated by critical care and will be admitted to their service. He has been more awake and alert and responsive to questions, however speech remains somewhat slurred and his chest remains congested.  He has been given antibiotics to cover for aspiration pneumonitis.  CRITICAL CARE Performed by: Christopher Orr Total critical care time: 45 minutes Critical care time was exclusive of separately billable procedures and treating other patients. Critical care was necessary to treat or prevent imminent or life-threatening deterioration. Critical care was time spent personally by me on the following activities: development of treatment plan with patient and/or surrogate as well as nursing, discussions with consultants, evaluation of patient's response to treatment, examination of patient, obtaining history from patient or surrogate, ordering and performing treatments and interventions, ordering and review of laboratory studies, ordering and review of radiographic studies, pulse oximetry and re-evaluation of patient's condition.   I personally performed the services described in this documentation, which was scribed in my presence. The recorded information has been reviewed and is accurate.    Christopher Speak, MD 07/27/14 [phone]

## 2014-07-27 NOTE — ED Notes (Signed)
Attempted report 

## 2014-07-27 NOTE — ED Notes (Signed)
Family  At triage

## 2014-07-27 NOTE — ED Notes (Signed)
Patient is alert oriented to name ,still states he doesn't remember what happened earlier tonight. Lungs are very congested sounding, patient will cough and it does sound a little clearer.

## 2014-07-27 NOTE — ED Notes (Signed)
The pt was found unresponsive in his car just pta.  He was breathing approx 4 times per minute.  He came around with iv narcan.,  On arrival with gems he is moaning  And not talking..  Iv per ems

## 2014-07-27 NOTE — ED Notes (Signed)
Daughter [name] [phone] (home)

## 2014-07-27 NOTE — ED Notes (Signed)
History of drug use for a long time per the paramedics who received this information from the family

## 2014-07-27 NOTE — ED Notes (Signed)
Transported  To Ct

## 2014-07-27 NOTE — H&P (Signed)
PULMONARY / CRITICAL CARE MEDICINE   Name: Christopher Orr MRN: [id number] DOB: 04-17-1946    ADMISSION DATE:  07/27/2014 CONSULTATION DATE:  07/27/2014  REFERRING MD :  EDP  CHIEF COMPLAINT:  AMS  INITIAL PRESENTATION:  68 y.o. M brought to [facility] ED early AM hours 12/31 after being found unresponsive in his car with respiratory rate of only 4.  In ED, imaging was negative, he received narcan and then proceeded to inform EDP that he injected heroin.  Lactate found to be 8.  PCCM asked to admit.  STUDIES:  CT Head 12/31 >>> negative CXR 12/31 >>> L > R basilar opacification  SIGNIFICANT EVENTS: 12/31 - found unresponsive in car, admitted to injecting heroin  HISTORY OF PRESENT ILLNESS:  Pt is encephalopathic; therefore, this HPI is obtained from chart review. Christopher Orr is a 68 y.o. M with unknown PMH.  He was brought to [facility] ED during early AM hours on 12/31 after he was found unresponsive in his car.  In ED, respiratory rate was only 4 and pt was moaning and groaning.  Family reported a long hx of drug and alcohol abuse.  He received IV narcan which helped and he later admitted that he had injected heroin earlier that night.  He was unable to recall any additional events.  In ED, initial workup c/w severe sepsis (hypotension, hypoxic, WBC 19, initial lactate 8).  CXR concerning for aspiration, Head CT negative.  PCCM was asked to admit.  Pt denies any recent fevers/chills/sweats, chest pain, SOB, abd pain, N/V/D.  He is somnolent but easily arouseable to voice.   PAST MEDICAL HISTORY :   has no past medical history on file.  has no past surgical history on file. Prior to Admission medications   Not on File   No Known Allergies  FAMILY HISTORY:  No family history on file.  SOCIAL HISTORY:  has no tobacco, alcohol, and drug history on file.  REVIEW OF SYSTEMS:   All negative; except for those that are bolded, which indicate positives.  Constitutional: weight loss, weight  gain, night sweats, fevers, chills, fatigue, weakness.  HEENT: headaches, sore throat, sneezing, nasal congestion, post nasal drip, difficulty swallowing, tooth/dental problems, visual complaints, visual changes, ear aches. Neuro: difficulty with speech, weakness, numbness, ataxia. CV:  chest pain, orthopnea, PND, swelling in lower extremities, dizziness, palpitations, syncope.  Resp: cough, hemoptysis, dyspnea, wheezing. GI  heartburn, indigestion, abdominal pain, nausea, vomiting, diarrhea, constipation, change in bowel habits, loss of appetite, hematemesis, melena, hematochezia.  GU: dysuria, change in color of urine, urgency or frequency, flank pain, hematuria. MSK: joint pain or swelling, decreased range of motion. Psych: change in mood or affect, depression, anxiety, suicidal ideations, homicidal ideations. Skin: rash, itching, bruising.   SUBJECTIVE:   VITAL SIGNS: Temp:  [97.7 F (36.5 C)] 97.7 F (36.5 C) (12/31 0221) Pulse Rate:  [91-108] 91 (12/31 0453) Resp:  [15-18] 18 (12/31 0453) BP: (80-106)/(57-72) 84/63 mmHg (12/31 0453) SpO2:  [86 %-95 %] 91 % (12/31 0453) HEMODYNAMICS:   VENTILATOR SETTINGS:   INTAKE / OUTPUT: Intake/Output      12/30 0701 - 12/31 0700   Urine 200   Total Output 200   Net -200         PHYSICAL EXAMINATION: General: WDWN male, in NAD. Neuro: Somnolent but easily arouseable.  Once aroused, is A&O x 3. HEENT: [place]/AT. PERRL, sclerae anicteric. Cardiovascular: RRR, no M/R/G.  Lungs: Respirations slow.  Coarse rhonchi L > R. Abdomen: BS  x 4, soft, NT/ND.  Musculoskeletal: No gross deformities, no edema.  Skin: Intact, warm, no rashes.  LABS:  CBC  Recent Labs Lab 07/27/14 0218  WBC 19.0*  HGB 13.4  HCT 42.2  PLT 281   Coag's No results for input(s): APTT, INR in the last 168 hours. BMET  Recent Labs Lab 07/27/14 0218  NA 142  K 5.6*  CL 106  CO2 22  BUN 16  CREATININE 2.78*  GLUCOSE 143*   Electrolytes  Recent  Labs Lab 07/27/14 0218  CALCIUM 9.0   Sepsis Markers  Recent Labs Lab 07/27/14 0218  LATICACIDVEN 8.0*   ABG No results for input(s): PHART, PCO2ART, PO2ART in the last 168 hours. Liver Enzymes  Recent Labs Lab 07/27/14 0218  AST 47*  ALT 23  ALKPHOS 112  BILITOT 0.3  ALBUMIN 4.2   Cardiac Enzymes No results for input(s): TROPONINI, PROBNP in the last 168 hours. Glucose No results for input(s): GLUCAP in the last 168 hours.  Imaging Ct Head Wo Contrast  07/27/2014   CLINICAL DATA:  Found unresponsive in vehicle around 1:30 a.m., altered mental status.  EXAM: CT HEAD WITHOUT CONTRAST  TECHNIQUE: Contiguous axial images were obtained from the base of the skull through the vertex without intravenous contrast.  COMPARISON:  None.  FINDINGS: The ventricles and sulci are normal for age. No intraparenchymal hemorrhage, mass effect nor midline shift. Patchy supratentorial white matter hypodensities are less than expected for patient's age and though non-specific suggest sequelae of chronic small vessel ischemic disease. No acute large vascular territory infarcts.  No abnormal extra-axial fluid collections. Basal cisterns are patent. Mild calcific atherosclerosis of the carotid siphons.  No skull fracture. 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 The included ocular globes and orbital contents are non-suspicious. While the frontal sinus mucosal thickening without paranasal sinus air-fluid levels. Soft tissue within the external auditory canals most consistent with cerumen.  IMPRESSION: No acute intracranial process ; normal noncontrast CT of the head for age.  Mild paranasal sinusitis.   Electronically Signed   By: Elon Alas   On: 07/27/2014 04:17   Dg Chest Portable 1 View  07/27/2014   CLINICAL DATA:  Unresponsive. Question drug overdose none currently available  EXAM: PORTABLE CHEST - 1 VIEW  COMPARISON:  None.  FINDINGS: Indistinct basilar opacities, left  greater than right. No edema. Aside from the excluded lateral left costophrenic sulcus, no effusion or pneumothorax. Normal heart size and mediastinal contours for technique (shadow inspiration and portable exam).  IMPRESSION: Left greater than right basilar lung opacification. History of unresponsiveness favors aspiration pneumonitis or pneumonia.   Electronically Signed   By: Jorje Guild M.D.   On: 07/27/2014 03:02    ASSESSMENT / PLAN:  NEUROLOGIC A:   Acute metabolic encephalopathy - improving, received narcan x 1 Polysubstance abuse - UDS positive for benzos, opiates, THC Hx ETOH abuse P:   Monitor clinically for signs of withdrawal. Thiamine, Folate. Heroin short acting, no narcan unless longer acting agent dicsovered  PULMONARY A: Acute hypoxic respiratory failure Respiratory Acidosis Probable aspiration PNA P:   Continue supplemental O2 as needed to maintain SpO2 > 92%. Abx per ID section. CXR in AM. NO BIPAP with asp risk Low threshold intubation, follow K and abg correction ph as it relates to K  CARDIOVASCULAR A:  Severe sepsis - initial lactate 8 Troponin leak - likely demand ischemia P:  IVF resuscitation (has received 2L, 3rd liter ordered). Trend troponin /  lactate. Monitor hemodynamics. May need line Await lactic clearance, clinically improving Ensure got 30 cc/kg volume  RENAL A:   AKI - unknown baseline Hyperkalemia R/o rhabdo - unknown downtime P:   NS @ 125. 1g calcium gluconate. 10u insulin. 1amp D50. Check CK. BMP in AM, awaited abg to ensure ph correction also Repeat lactic  GASTROINTESTINAL A:   Nutrition P:   NPO for now until more awake. ppi  HEMATOLOGIC A:   VTE Prophylaxis Macrocytosis P:  SCD's / Heparin. CBC in AM.  INFECTIOUS A:   Severe sepsis - etiology likely aspiration PNA P:   Sputum Cx 12/31 Abx: Zosyn, start date 12/31, day 1/x. BC 12/31>>>  PCT algo to limit Abx exposure  ENDOCRINE A:    Hyperglycemia P:   SSI if glucose consistently > 180.  Family updated: None.  Interdisciplinary Family Meeting v Palliative Care Meeting:  Due by: 01/06.   Montey Hora, [place] Pgr: [phone]  or [phone] 07/27/2014, 5:25 AM  STAFF NOTE: I, Merrie Roof, MD FACP have personally reviewed patient's available data, including medical history, events of note, physical examination and test results as part of my evaluation. I have discussed with resident/NP and other care providers such as pharmacist, RN and RRT. In addition, I personally evaluated patient and elicited key findings of: Improving clinically, more awake, BP wnl, repeat abg, k , lactic, may need line, continued resuscitation to goal 30 cc/kg bolus, zosyn, no narcan at this stage required, ETOH wd risk The patient is critically ill with multiple organ systems failure and requires high complexity decision making for assessment and support, frequent evaluation and titration of therapies, application of advanced monitoring technologies and extensive interpretation of multiple databases.   Critical Care Time devoted to patient care services described in this note is30 Minutes. This time reflects time of care of this signee: Merrie Roof, MD FACP. This critical care time does not reflect procedure time, or teaching time or supervisory time of PA/NP/Med student/Med Resident etc but could involve care discussion time. Rest per NP/medical resident whose note is outlined above and that I agree with   Lavon Paganini. Titus Mould, MD, [place] Pgr: [place] 07/27/2014 8:38 AM

## 2014-07-28 ENCOUNTER — Inpatient Hospital Stay (HOSPITAL_COMMUNITY): Payer: Medicare PPO

## 2014-07-28 ENCOUNTER — Encounter (HOSPITAL_COMMUNITY): Payer: Self-pay | Admitting: Physician Assistant

## 2014-07-28 DIAGNOSIS — R778 Other specified abnormalities of plasma proteins: Secondary | ICD-10-CM

## 2014-07-28 DIAGNOSIS — R079 Chest pain, unspecified: Secondary | ICD-10-CM

## 2014-07-28 DIAGNOSIS — R0789 Other chest pain: Secondary | ICD-10-CM

## 2014-07-28 DIAGNOSIS — N179 Acute kidney failure, unspecified: Secondary | ICD-10-CM

## 2014-07-28 DIAGNOSIS — J449 Chronic obstructive pulmonary disease, unspecified: Secondary | ICD-10-CM

## 2014-07-28 DIAGNOSIS — J690 Pneumonitis due to inhalation of food and vomit: Secondary | ICD-10-CM

## 2014-07-28 DIAGNOSIS — E872 Acidosis, unspecified: Secondary | ICD-10-CM

## 2014-07-28 DIAGNOSIS — R7989 Other specified abnormal findings of blood chemistry: Secondary | ICD-10-CM

## 2014-07-28 DIAGNOSIS — F329 Major depressive disorder, single episode, unspecified: Secondary | ICD-10-CM

## 2014-07-28 DIAGNOSIS — J9600 Acute respiratory failure, unspecified whether with hypoxia or hypercapnia: Secondary | ICD-10-CM

## 2014-07-28 DIAGNOSIS — F32A Depression, unspecified: Secondary | ICD-10-CM

## 2014-07-28 DIAGNOSIS — T401X1D Poisoning by heroin, accidental (unintentional), subsequent encounter: Secondary | ICD-10-CM

## 2014-07-28 DIAGNOSIS — K219 Gastro-esophageal reflux disease without esophagitis: Secondary | ICD-10-CM

## 2014-07-28 DIAGNOSIS — F419 Anxiety disorder, unspecified: Secondary | ICD-10-CM

## 2014-07-28 HISTORY — DX: Acute kidney failure, unspecified: N17.9

## 2014-07-28 HISTORY — DX: Pneumonitis due to inhalation of food and vomit: J69.0

## 2014-07-28 HISTORY — DX: Other specified abnormal findings of blood chemistry: R79.89

## 2014-07-28 HISTORY — DX: Chest pain, unspecified: R07.9

## 2014-07-28 LAB — CBC
HEMATOCRIT: 35.1 % — AB (ref 39.0–52.0)
Hemoglobin: 11.4 g/dL — ABNORMAL LOW (ref 13.0–17.0)
MCH: 31.5 pg (ref 26.0–34.0)
MCHC: 32.5 g/dL (ref 30.0–36.0)
MCV: 97 fL (ref 78.0–100.0)
PLATELETS: 225 10*3/uL (ref 150–400)
RBC: 3.62 MIL/uL — AB (ref 4.22–5.81)
RDW: 14.4 % (ref 11.5–15.5)
WBC: 12.4 10*3/uL — ABNORMAL HIGH (ref 4.0–10.5)

## 2014-07-28 LAB — BASIC METABOLIC PANEL
Anion gap: 9 (ref 5–15)
BUN: 19 mg/dL (ref 6–23)
CHLORIDE: 107 meq/L (ref 96–112)
CO2: 23 mmol/L (ref 19–32)
Calcium: 8.6 mg/dL (ref 8.4–10.5)
Creatinine, Ser: 1.68 mg/dL — ABNORMAL HIGH (ref 0.50–1.35)
GFR calc non Af Amer: 40 mL/min — ABNORMAL LOW (ref >90)
GFR, EST AFRICAN AMERICAN: 47 mL/min — AB (ref >90)
Glucose, Bld: 90 mg/dL (ref 70–99)
Potassium: 3.9 mmol/L (ref 3.5–5.1)
Sodium: 139 mmol/L (ref 135–145)

## 2014-07-28 LAB — EXPECTORATED SPUTUM ASSESSMENT W REFEX TO RESP CULTURE

## 2014-07-28 LAB — PHOSPHORUS: Phosphorus: 1.9 mg/dL — ABNORMAL LOW (ref 2.3–4.6)

## 2014-07-28 LAB — EXPECTORATED SPUTUM ASSESSMENT W GRAM STAIN, RFLX TO RESP C

## 2014-07-28 LAB — PROCALCITONIN: Procalcitonin: 1.15 ng/mL

## 2014-07-28 LAB — GLUCOSE, CAPILLARY
GLUCOSE-CAPILLARY: 116 mg/dL — AB (ref 70–99)
Glucose-Capillary: 117 mg/dL — ABNORMAL HIGH (ref 70–99)
Glucose-Capillary: 86 mg/dL (ref 70–99)
Glucose-Capillary: 88 mg/dL (ref 70–99)
Glucose-Capillary: 88 mg/dL (ref 70–99)

## 2014-07-28 LAB — TROPONIN I
Troponin I: 0.72 ng/mL (ref <0.031)
Troponin I: 0.32 ng/mL — ABNORMAL HIGH (ref <0.031)

## 2014-07-28 LAB — MAGNESIUM: Magnesium: 1.9 mg/dL (ref 1.5–2.5)

## 2014-07-28 MED ORDER — ACETAMINOPHEN 500 MG PO TABS
1000.0000 mg | ORAL_TABLET | Freq: Once | ORAL | Status: AC
  Administered 2014-07-28: 1000 mg via ORAL
  Filled 2014-07-28: qty 2

## 2014-07-28 MED ORDER — ROSUVASTATIN CALCIUM 5 MG PO TABS
5.0000 mg | ORAL_TABLET | Freq: Every day | ORAL | Status: DC
  Administered 2014-07-28 – 2014-07-29 (×2): 5 mg via ORAL
  Filled 2014-07-28 (×3): qty 1

## 2014-07-28 MED ORDER — IPRATROPIUM-ALBUTEROL 0.5-2.5 (3) MG/3ML IN SOLN
3.0000 mL | Freq: Four times a day (QID) | RESPIRATORY_TRACT | Status: DC
  Administered 2014-07-28 – 2014-07-29 (×3): 3 mL via RESPIRATORY_TRACT
  Filled 2014-07-28 (×3): qty 3

## 2014-07-28 MED ORDER — PRASUGREL HCL 10 MG PO TABS
10.0000 mg | ORAL_TABLET | Freq: Every day | ORAL | Status: DC
  Administered 2014-07-28 – 2014-07-30 (×3): 10 mg via ORAL
  Filled 2014-07-28 (×3): qty 1

## 2014-07-28 MED ORDER — FLUOXETINE HCL 10 MG PO CAPS
10.0000 mg | ORAL_CAPSULE | Freq: Every day | ORAL | Status: DC
  Administered 2014-07-28 – 2014-07-30 (×3): 10 mg via ORAL
  Filled 2014-07-28 (×3): qty 1

## 2014-07-28 MED ORDER — PANTOPRAZOLE SODIUM 40 MG PO TBEC
40.0000 mg | DELAYED_RELEASE_TABLET | Freq: Every day | ORAL | Status: DC
  Administered 2014-07-29 – 2014-07-30 (×2): 40 mg via ORAL
  Filled 2014-07-28 (×2): qty 1

## 2014-07-28 MED ORDER — ASPIRIN EC 81 MG PO TBEC
81.0000 mg | DELAYED_RELEASE_TABLET | Freq: Every day | ORAL | Status: DC
  Administered 2014-07-28 – 2014-07-30 (×3): 81 mg via ORAL
  Filled 2014-07-28 (×3): qty 1

## 2014-07-28 MED ORDER — FAMOTIDINE 20 MG PO TABS
20.0000 mg | ORAL_TABLET | Freq: Every day | ORAL | Status: DC
  Administered 2014-07-28 – 2014-07-30 (×4): 20 mg via ORAL
  Filled 2014-07-28 (×3): qty 1

## 2014-07-28 MED ORDER — VITAMIN C 250 MG PO TABS
250.0000 mg | ORAL_TABLET | Freq: Every day | ORAL | Status: DC
  Administered 2014-07-28 – 2014-07-30 (×3): 250 mg via ORAL
  Filled 2014-07-28 (×3): qty 1

## 2014-07-28 MED ORDER — DIAZEPAM 5 MG PO TABS
5.0000 mg | ORAL_TABLET | Freq: Every day | ORAL | Status: DC
  Administered 2014-07-28 – 2014-07-30 (×3): 5 mg via ORAL
  Filled 2014-07-28 (×3): qty 1

## 2014-07-28 MED ORDER — IPRATROPIUM-ALBUTEROL 0.5-2.5 (3) MG/3ML IN SOLN
RESPIRATORY_TRACT | Status: AC
  Administered 2014-07-28: 3 mL
  Filled 2014-07-28: qty 3

## 2014-07-28 MED ORDER — IBUPROFEN 600 MG PO TABS
600.0000 mg | ORAL_TABLET | Freq: Once | ORAL | Status: AC
  Administered 2014-07-28: 600 mg via ORAL
  Filled 2014-07-28: qty 1

## 2014-07-28 MED ORDER — LEVOFLOXACIN 500 MG PO TABS
500.0000 mg | ORAL_TABLET | Freq: Every day | ORAL | Status: DC
  Administered 2014-07-28 – 2014-07-30 (×3): 500 mg via ORAL
  Filled 2014-07-28 (×3): qty 1

## 2014-07-28 MED ORDER — ARIPIPRAZOLE 10 MG PO TABS
10.0000 mg | ORAL_TABLET | Freq: Every day | ORAL | Status: DC
  Administered 2014-07-28 – 2014-07-30 (×3): 10 mg via ORAL
  Filled 2014-07-28 (×3): qty 1

## 2014-07-28 MED ORDER — DEXTROSE 5 % IV SOLN
24.0000 mmol | Freq: Once | INTRAVENOUS | Status: AC
  Administered 2014-07-28: 24 mmol via INTRAVENOUS
  Filled 2014-07-28: qty 8

## 2014-07-28 MED ORDER — TRAMADOL HCL 50 MG PO TABS
50.0000 mg | ORAL_TABLET | Freq: Once | ORAL | Status: AC
  Administered 2014-07-28: 50 mg via ORAL
  Filled 2014-07-28: qty 1

## 2014-07-28 MED ORDER — MAGNESIUM SULFATE 2 GM/50ML IV SOLN
2.0000 g | Freq: Once | INTRAVENOUS | Status: AC
  Administered 2014-07-28: 2 g via INTRAVENOUS
  Filled 2014-07-28: qty 50

## 2014-07-28 MED ORDER — VITAMIN C 100 MG PO TABS: 100.0000 mg | ORAL_TABLET | Freq: Every day | ORAL | Status: DC

## 2014-07-28 MED ORDER — TRAZODONE HCL 100 MG PO TABS
100.0000 mg | ORAL_TABLET | Freq: Every day | ORAL | Status: DC
  Administered 2014-07-28 – 2014-07-29 (×2): 100 mg via ORAL
  Filled 2014-07-28 (×3): qty 1

## 2014-07-28 MED ORDER — GI COCKTAIL ~~LOC~~
30.0000 mL | Freq: Once | ORAL | Status: DC
  Filled 2014-07-28 (×2): qty 30

## 2014-07-28 MED ORDER — SODIUM CHLORIDE 0.9 % IV SOLN
INTRAVENOUS | Status: DC
  Administered 2014-07-28: 21:00:00 via INTRAVENOUS

## 2014-07-28 NOTE — Clinical Social Work Note (Signed)
Clinical Social Worker provided handoff to assigned CSW of 2W. This CSW signing off.   Glendon Axe, MSW, LCSWA [phone] 07/28/2014 2:19 PM

## 2014-07-28 NOTE — H&P (Addendum)
PULMONARY / CRITICAL CARE MEDICINE   Name: Christopher Orr MRN: [id number] DOB: 1945/12/01    ADMISSION DATE:  07/27/2014 CONSULTATION DATE:  07/28/2014  REFERRING MD :  EDP  CHIEF COMPLAINT:  AMS  INITIAL PRESENTATION:    Pt is encephalopathic; therefore, this HPI is obtained from chart review. Christopher Orr is a 69 y.o. M with unknown PMH.  He was brought to [facility] ED during early AM hours on 12/31 after he was found unresponsive in his car.  In ED, respiratory rate was only 4 and pt was moaning and groaning.  Family reported a long hx of drug and alcohol abuse.  He received IV narcan which helped and he later admitted that he had injected heroin earlier that night.  He was unable to recall any additional events.  In ED, initial workup c/w severe sepsis (hypotension, hypoxic, WBC 19, initial lactate 8).  CXR concerning for aspiration, Head CT negative.  PCCM was asked to admit.  Pt denies any recent fevers/chills/sweats, chest pain, SOB, abd pain, N/V/D.  He is somnolent but easily arouseable to voice.   has a past medical history of History of lumbar fusion; Depression; and COPD (chronic obstructive pulmonary disease). CAD -s/p stent    STUDIES and EVENTS CT Head 12/31 >>> negative CXR 12/31 >>> L > R basilar opacification 12/31 - found unresponsive in car, admitted to injecting heroin Urine tox positive for benzo, opioids and MJ. No cocaine    SUBJECTIVE/OVERNIGHT/INTERVAL HX  07/28/14: never got intubated. [place]. Doing well. Has some headache -s ays severe migraine. Also co "elephant in chest" chest pain for several days, waxing and waning without radiation. Denies resp distress but has hx of copd  VITAL SIGNS: Temp:  [97.8 F (36.6 C)-99.8 F (37.7 C)] 99.1 F (37.3 C) (01/01 0807) Pulse Rate:  [70-90] 75 (01/01 0900) Resp:  [10-24] 15 (01/01 0900) BP: (92-145)/(44-95) 124/79 mmHg (01/01 0900) SpO2:  [91 %-97 %] 95 % (01/01 0900) FiO2 (%):  [35 %-40 %] 35 % (01/01  0430) Weight:  [119.4 kg (263 lb 3.7 oz)] 119.4 kg (263 lb 3.7 oz) (01/01 0500) HEMODYNAMICS:   VENTILATOR SETTINGS: Vent Mode:  [-]  FiO2 (%):  [35 %-40 %] 35 % INTAKE / OUTPUT: Intake/Output      12/31 0701 - 01/01 0700 01/01 0701 - 01/02 0700   P.O. 120    I.V. (mL/kg) 2897.9 (24.3)    IV Piggyback 150    Total Intake(mL/kg) 3167.9 (26.5)    Urine (mL/kg/hr) 1175 (0.4)    Total Output 1175     Net +1992.9          Urine Occurrence 1 x      PHYSICAL EXAMINATION: General: WDWN male, in NAD. Neuro: A&O x 3. GCS 15,. CAM0=ICU neg for delirium,. Moves all 4s/  HEENT: [place]/AT. PERRL, sclerae anicteric. Cardiovascular: RRR, no M/R/G.  Lungs: Respirations CTA bilaterally Abdomen: BS x 4, soft, NT/ND.  Musculoskeletal: No gross deformities, no edema.  Skin: Intact, warm, no rashes.  LABS: PULMONARY  Recent Labs Lab 07/27/14 0525 07/27/14 0903  PHART 7.178* 7.236*  PCO2ART 57.3* 50.3*  PO2ART 69.0* 78.0*  HCO3 21.3 21.3  TCO2 23 23  O2SAT 88.0 92.0    CBC  Recent Labs Lab 07/27/14 0218 07/27/14 0529 07/28/14 0235  HGB 13.4 12.9* 11.4*  HCT 42.2 39.9 35.1*  WBC 19.0* 15.4* 12.4*  PLT 281 271 225    COAGULATION No results for input(s): INR in the last  168 hours.  CARDIAC   Recent Labs Lab 07/27/14 0529 07/27/14 1000 07/27/14 1629  TROPONINI 0.31* 0.54* 0.65*   No results for input(s): PROBNP in the last 168 hours.   CHEMISTRY  Recent Labs Lab 07/27/14 0218 07/27/14 0529 07/27/14 0847 07/27/14 1400 07/27/14 1947 07/28/14 0235  NA 142  --  140 140 138 139  K 5.6*  --  4.9 4.6 4.0 3.9  CL 106  --  109 108 110 107  CO2 22  --  21 23 24 23   GLUCOSE 143*  --  103* 107* 103* 90  BUN 16  --  22 22 23 19   CREATININE 2.78* 2.74* 2.58* 2.38* 1.96* 1.68*  CALCIUM 9.0  --  8.4 8.3* 8.3* 8.6  MG  --   --   --   --   --  1.9  PHOS  --   --   --   --   --  1.9*   Estimated Creatinine Clearance: 56.9 mL/min (by C-G formula based on Cr of  1.68).   LIVER  Recent Labs Lab 07/27/14 0218  AST 47*  ALT 23  ALKPHOS 112  BILITOT 0.3  PROT 7.9  ALBUMIN 4.2     INFECTIOUS  Recent Labs Lab 07/27/14 0908 07/27/14 1000 07/27/14 1629 07/28/14 0235  LATICACIDVEN 1.4 1.1 1.7  --   PROCALCITON 1.61  --   --  1.15     ENDOCRINE CBG (last 3)   Recent Labs  07/27/14 2352 07/28/14 0433 07/28/14 0805  GLUCAP 88 86 88         IMAGING x48h Ct Head Wo Contrast  07/27/2014   CLINICAL DATA:  Found unresponsive in vehicle around 1:30 a.m., altered mental status.  EXAM: CT HEAD WITHOUT CONTRAST  TECHNIQUE: Contiguous axial images were obtained from the base of the skull through the vertex without intravenous contrast.  COMPARISON:  None.  FINDINGS: The ventricles and sulci are normal for age. No intraparenchymal hemorrhage, mass effect nor midline shift. Patchy supratentorial white matter hypodensities are less than expected for patient's age and though non-specific suggest sequelae of chronic small vessel ischemic disease. No acute large vascular territory infarcts.  No abnormal extra-axial fluid collections. Basal cisterns are patent. Mild calcific atherosclerosis of the carotid siphons.  No skull fracture. 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 The included ocular globes and orbital contents are non-suspicious. While the frontal sinus mucosal thickening without paranasal sinus air-fluid levels. Soft tissue within the external auditory canals most consistent with cerumen.  IMPRESSION: No acute intracranial process ; normal noncontrast CT of the head for age.  Mild paranasal sinusitis.   Electronically Signed   By: Elon Alas   On: 07/27/2014 04:17   Dg Chest Port 1 View  07/28/2014   CLINICAL DATA:  Airspace disease  EXAM: PORTABLE CHEST - 1 VIEW  COMPARISON:  the previous day's study  FINDINGS: Coarse mild interstitial opacities throughout both lungs as before. Patchy airspace opacities at the  left lung base have slightly improved. Heart size upper limits normal for technique.  No effusion.  No pneumothorax. Visualized skeletal structures are unremarkable.  IMPRESSION: 1. Partial resolution of left lower lung airspace disease.   Electronically Signed   By: Arne Cleveland M.D.   On: 07/28/2014 08:41   Dg Chest Portable 1 View  07/27/2014   CLINICAL DATA:  Unresponsive. Question drug overdose none currently available  EXAM: PORTABLE CHEST - 1 VIEW  COMPARISON:  None.  FINDINGS: Indistinct basilar opacities, left greater than right. No edema. Aside from the excluded lateral left costophrenic sulcus, no effusion or pneumothorax. Normal heart size and mediastinal contours for technique (shadow inspiration and portable exam).  IMPRESSION: Left greater than right basilar lung opacification. History of unresponsiveness favors aspiration pneumonitis or pneumonia.   Electronically Signed   By: Jorje Guild M.D.   On: 07/27/2014 03:02        ASSESSMENT / PLAN:  NEUROLOGIC A:   Baseline: on abilify, prozac and valium - for depression/anxity Acute metabolic encephalopathy - improving, received narcan x 1 Polysubstance abuse - UDS positive for benzos, opiates, THC Hx ETOH abuse   - 07/28/2014: Normal mental status. Report migraine headache. Non focal neurology exam  P:   ADvil, tylenol, Motrin x 1 for headache (no triptan due to CAD and stent and positive troponin and active chest pain) Monitor clinically for signs of withdrawal. Thiamine, Folate Restart home abilify, prozac, valium  (valium at lower dose)   PULMONARY A: Baseline   - COPD NOw Acute hypoxic respiratory failure Respiratory Acidosis  07/28/2014: stable resp status   P:   Continue supplemental O2 as needed to maintain SpO2 > 92%. Duoneb for copd  CARDIOVASCULAR A:  Troponin leak - likely demand ischemia v rhabdo but c/o chest pain 07/28/2014 and has hx of stent. EKG nil acute and appears grossly unchanged x  24h P:  Repat troponin cycle Get ECHO Asa 81mg  daily - home med restart Cards consult   RENAL   A:   At presentation AKI - unknown baseline Mild rhabdo Severe lactic acidosis - likely due to bein down rather than sepsis  On 07/28/2014 : improving AKI, but mag and phos olow,CK status not known. Resolved lactic acidosis  P:   NS @ 125.> reduce to 50cc/h Replete mag and phos REcheck CK tomorrow   GASTROINTESTINAL A:   Has baseline gERD - c/p chest pain P:   Heart healthy deit PPI  H2 blockade GI cocktail  HEMATOLOGIC A:   VTE Prophylaxis Macrocytosis P:  SCD's / Heparin. CBC in AM.  INFECTIOUS A:    likely aspiration PNA - doubt severe sepsis, PCT profile suggests localized infection P:   Sputum Cx 12/31 Abx: Zosyn, start date 12/31, > 07/28/14, Levauin x 5 days (QTc ok 07/28/2014) BC 12/31>>>  PCT algo to limit Abx exposure  ENDOCRINE A:   Hyperglycemia P:   SSI if glucose consistently > 180.  Family updated: Patient updated 07/28/2014 .  Interdisciplinary Family Meeting v Palliative Care Meeting:  Due by: 01/06.    GLOBAL Move to telel 07/28/2014. GEt cards consult. Rx migraine without triptan. [place] for withdrawal    Dr. Brand Males, M.D., [facility].C.P Pulmonary and Critical Care Medicine Staff Physician [place] [facility] Pager: [phone], If no answer or between  15:00h - 7:00h: call [phone]  07/28/2014 10:07 AM

## 2014-07-28 NOTE — Progress Notes (Signed)
Pt expressed concern that he was about to lose his insurance coverage. Case management was notified.

## 2014-07-28 NOTE — Consult Note (Signed)
Cardiology Consultation Note  Patient ID: JUANITA DEVINCENT, MRN: [id number], DOB/AGE: Apr 19, 1946 69 y.o. Admit date: 07/27/2014   Date of Consult: 07/28/2014 Primary Physician: No primary care provider on file. Primary Cardiologist: Dr. Humphrey Rolls in [facility]  Chief Complaint: chest tightness  Reason for Consult: CP, elevated troponin (admitted with unresponsiveness s/p heroin)  HPI: Mr. Pfeifle is a 69 y/o M with history of COPD, CAD (s/p stent to LAD at [facility] in 2006 - EF 60% at that time, reported nuc within the last year that was OK), GERD, depression, polysubstance abuse including alcohol/THC/heroin who was admitted overnight with unresponsiveness and acute respiratory depression/failure. He says yesterday he did heroin for the first time. He was later found unresponsive in his car. He doesn't remember any interim details. In the ED, respiratory rate was only 4 and he was moaning and groaning. He received IV narcan which helped. Initial workup c/w sepsis (hypotension, hypoxic, WBC 19, initial lactate 8) later felt due to being down, AKI/hyperkalemia with Cr 2.78, initial pH 7.1, UDS + benzos, opiates, THC, negative for cocaine. Troponins 0.13-0.31-0.54-0.65-0.72. Total CK 4374. CXR concerning for aspiration. Head CT nonacute. With supportive measures he is back to normal mental status and renal function improving. He remains on [place]. He reports since yesterday he's had a constant substernal chest tightness worse with lying down, better sitting up but not totally resolved. It is not worse with inspiration, palpation or moving around. Discomfort is also increased when he is placed on the respiratory mask. He feels smothered when lying back. He has been coughing a lot. No hemoptysis. He reports a headache but no acute neurologic deficits. Home meds indicated he was on Effient at home and this has been continued along with aspirin and Crestor. Home med list does not include a BB. He says that prior to this admission  he had been feeling well lately without any exertional CP or SOB. He denies diaphoresis, nausea, vomiting, syncope, LEE.  Tele showing NSR occasional PVCs. 2D echo pending. EKG without acute ST changes.  Past Medical History  Diagnosis Date  . History of lumbar fusion   . Depression   . COPD (chronic obstructive pulmonary disease)   . Drug use   . Alcohol abuse   . CAD (coronary artery disease)     a. s/p stent to LAD in 2006 at [facility], EF 60% at that time.      Most Recent Cardiac Studies: None available - Christell Faith PA-C with our [facility] office confirms there are no recent caths dating back at least 3 years, but did locate the discharge summary citing LAD stent in 2006. We do not have access to outpatient [facility] or Dr. Laurelyn Sickle office records.   Surgical History:  Past Surgical History  Procedure Laterality Date  . Lumbar fusion    . Tonsillectomy       Home Meds: Prior to Admission medications   Medication Sig Start Date End Date Taking? Authorizing Provider  ARIPiprazole (ABILIFY) 10 MG tablet Take 10 mg by mouth daily.   Yes Historical Provider, MD  Ascorbic Acid (VITAMIN C) 100 MG tablet Take 100 mg by mouth daily.   Yes Historical Provider, MD  CRESTOR 5 MG tablet Take 5 mg by mouth at bedtime. 06/17/14  Yes Historical Provider, MD  Cyanocobalamin (B-12 PO) Take 1 tablet by mouth daily.   Yes Historical Provider, MD  diazepam (VALIUM) 10 MG tablet Take 10 mg by mouth daily.  07/05/14  Yes Historical Provider, MD  EFFIENT 10 MG TABS tablet Take 10 mg by mouth daily. 06/17/14  Yes Historical Provider, MD  FLUoxetine (PROZAC) 10 MG capsule Take 10 mg by mouth daily.   Yes Historical Provider, MD  Multiple Vitamins-Minerals (MULTIVITAMIN WITH MINERALS) tablet Take 1 tablet by mouth daily.   Yes Historical Provider, MD  NEXIUM 40 MG capsule Take 40 mg by mouth. every morning 30-60 minutes prior to eating. 07/04/14  Yes Historical Provider, MD  Sodium Bicarbonate-Citric Acid  (ALKA-SELTZER HEARTBURN PO) Take 1 tablet by mouth daily as needed (heartburn and acid reflux).   Yes Historical Provider, MD  traZODone (DESYREL) 100 MG tablet Take 100 mg by mouth at bedtime.   Yes Historical Provider, MD    Inpatient Medications:  . antiseptic oral rinse  7 mL Mouth Rinse BID  . ARIPiprazole  10 mg Oral Daily  . aspirin EC  81 mg Oral Daily  . diazepam  5 mg Oral Daily  . famotidine  20 mg Oral Daily  . FLUoxetine  10 mg Oral Daily  . folic acid  1 mg Intravenous Daily  . gi cocktail  30 mL Oral Once  . heparin  5,000 Units Subcutaneous 3 times per day  . ipratropium-albuterol  3 mL Nebulization Q6H  . levofloxacin  500 mg Oral Daily  . [START ON 07/29/2014] pantoprazole  40 mg Oral Daily  . potassium phosphate IVPB (mmol)  24 mmol Intravenous Once  . prasugrel  10 mg Oral Daily  . rosuvastatin  5 mg Oral QHS  . thiamine  100 mg Intravenous Daily  . traZODone  100 mg Oral QHS  . vitamin C  250 mg Oral Daily   . sodium chloride 50 mL/hr at 07/28/14 1019    Allergies: No Known Allergies  History   Social History  . Marital Status: Divorced    Spouse Name: N/A    Number of Children: N/A  . Years of Education: N/A   Occupational History  . Not on file.   Social History Main Topics  . Smoking status: Former Smoker -- 1.50 packs/day    Types: Cigarettes    Start date: 07/28/1962    Quit date: 07/28/2012  . Smokeless tobacco: Never Used  . Alcohol Use: 0.0 oz/week    0 Not specified per week     Comment: 6pack and fifth of liquor each day  . Drug Use: Yes    Special: Heroin     Comment: Endorsed heroin 06/2014, UDS also + benzos, opiates, THC, neg for cocaine at that time.  Marland Kitchen Sexual Activity: Not on file   Other Topics Concern  . Not on file   Social History Narrative     Family History  Problem Relation Age of Onset  . Heart disease Father     Unclear details. Father passed in late 50's 2/2 cancer.  . Cancer Father      Review of  Systems: General: negative for chills, fever, night sweats. Says he's gained 45 lbs gradually in 1 yr. Cardiovascular: negative for palpitations Dermatological: negative for rash Respiratory: +cough Urologic: negative for hematuria Abdominal: negative for nausea, vomiting, diarrhea, bright red blood per rectum, melena, or hematemesis All other systems reviewed and are otherwise negative except as noted above.  Labs:  Recent Labs  07/27/14 0527 07/27/14 0529 07/27/14 1000 07/27/14 1629 07/28/14 1013  CKTOTAL 4374*  --   --   --   --   TROPONINI  --  0.31* 0.54* 0.65* 0.72*  Lab Results  Component Value Date   WBC 12.4* 07/28/2014   HGB 11.4* 07/28/2014   HCT 35.1* 07/28/2014   MCV 97.0 07/28/2014   PLT 225 07/28/2014    Recent Labs Lab 07/27/14 0218  07/28/14 0235  NA 142  < > 139  K 5.6*  < > 3.9  CL 106  < > 107  CO2 22  < > 23  BUN 16  < > 19  CREATININE 2.78*  < > 1.68*  CALCIUM 9.0  < > 8.6  PROT 7.9  --   --   BILITOT 0.3  --   --   ALKPHOS 112  --   --   ALT 23  --   --   AST 47*  --   --   GLUCOSE 143*  < > 90  < > = values in this interval not displayed. Radiology/Studies:  Ct Head Wo Contrast  07/27/2014   CLINICAL DATA:  Found unresponsive in vehicle around 1:30 a.m., altered mental status.  EXAM: CT HEAD WITHOUT CONTRAST  TECHNIQUE: Contiguous axial images were obtained from the base of the skull through the vertex without intravenous contrast.  COMPARISON:  None.  FINDINGS: The ventricles and sulci are normal for age. No intraparenchymal hemorrhage, mass effect nor midline shift. Patchy supratentorial white matter hypodensities are less than expected for patient's age and though non-specific suggest sequelae of chronic small vessel ischemic disease. No acute large vascular territory infarcts.  No abnormal extra-axial fluid collections. Basal cisterns are patent. Mild calcific atherosclerosis of the carotid siphons.  No skull fracture. 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 The included ocular globes and orbital contents are non-suspicious. While the frontal sinus mucosal thickening without paranasal sinus air-fluid levels. Soft tissue within the external auditory canals most consistent with cerumen.  IMPRESSION: No acute intracranial process ; normal noncontrast CT of the head for age.  Mild paranasal sinusitis.   Electronically Signed   By: Elon Alas   On: 07/27/2014 04:17   Dg Chest Port 1 View  07/28/2014   CLINICAL DATA:  Airspace disease  EXAM: PORTABLE CHEST - 1 VIEW  COMPARISON:  the previous day's study  FINDINGS: Coarse mild interstitial opacities throughout both lungs as before. Patchy airspace opacities at the left lung base have slightly improved. Heart size upper limits normal for technique.  No effusion.  No pneumothorax. Visualized skeletal structures are unremarkable.  IMPRESSION: 1. Partial resolution of left lower lung airspace disease.   Electronically Signed   By: Arne Cleveland M.D.   On: 07/28/2014 08:41   Dg Chest Portable 1 View  07/27/2014   CLINICAL DATA:  Unresponsive. Question drug overdose none currently available  EXAM: PORTABLE CHEST - 1 VIEW  COMPARISON:  None.  FINDINGS: Indistinct basilar opacities, left greater than right. No edema. Aside from the excluded lateral left costophrenic sulcus, no effusion or pneumothorax. Normal heart size and mediastinal contours for technique (shadow inspiration and portable exam).  IMPRESSION: Left greater than right basilar lung opacification. History of unresponsiveness favors aspiration pneumonitis or pneumonia.   Electronically Signed   By: Jorje Guild M.D.   On: 07/27/2014 03:02   EKG:  07/27/14: NSR 109bpm with occasional PVCs, nonspecific IVCD, low voltage QRS, no acute ST-T changes 07/28/14: NSR 74bpm with one PVC, nonspecific IVCD, low voltage QRS, no acute ST-T changes  Physical Exam: Blood pressure 126/82, pulse 85, temperature 99.3 F  (37.4 C), temperature source Oral, resp. rate 17,  height 6' (1.829 m), weight 263 lb 3.7 oz (119.4 kg), SpO2 91 %. General: Well developed chronically ill appearing WM in no acute distress. Head: Normocephalic, atraumatic, sclera non-icteric, no xanthomas, nares are without discharge.  Neck: Negative for carotid bruits. JVD not elevated. Lungs: Diminished BS throughout without wheezes, rales, or rhonchi. Breathing is unlabored. Heart: RRR with S1 S2. No murmurs, rubs, or gallops appreciated. Abdomen: Soft, non-tender, non-distended with normoactive bowel sounds. No hepatomegaly. No rebound/guarding. No obvious abdominal masses. Msk:  Strength and tone appear normal for age. Extremities: No clubbing or cyanosis. No edema.  Distal pedal pulses are 2+ and equal bilaterally. Neuro: Alert and oriented X 3. No facial asymmetry. No focal deficit. Moves all extremities spontaneously. Psych:  Responds to questions appropriately with a normal affect.   Assessment and Plan:   1. Acute respiratory failure/acute encephalopathy in the setting of heroin use, c/b by severe lactic acidosis, leukocytosis, rhabdomyolysis, AKI 2. Polysubstance abuse with recent heroin use, UDS also + benzos, opiates, THC 3. Alcohol abuse  4. Precordial chest pain 5. CAD s/p PCI 8-10 years ago, records not available 6. Acute kidney injury with unclear creatinine baseline, improving 7. GERD  Chest pain is somewhat atypical and he has no acute EKG changes. Difficult to know if troponins represent a secondary ACS but at this time are more likely demand ischemia in the setting of his initial presentation as well as rhabdomyolysis. Agree with plans for echo. Continue ASA, statin, Effient. Hold off on BB due to significant COPD but might consider bisoprolol as his resp status improves. Will discuss plan for heparin +/- further cardiac management with MD. PCCM has ordered analgesic therapy which we agree with. His biggest issue going  forward will need to be improvement of his social situation. If he does not cease high risk behavior including drugs and alcohol he will have a poor prognosis.  Signed, Melina Copa PA-C 07/28/2014, 12:40 PM  Patient was examined.  History of labs were reviewed.  EKG was personally reviewed.  The EKG shows occasional PVCs but no ischemic changes.  I think that a mild stable elevation of troponins is reflective of demand ischemia from his acute respiratory failure in the setting of heroin use, dictated by rhabdomyolysis and acute kidney injury.  I do not think that this represents a acute coronary syndrome.  I would not add IV heparin at this time.  He appears to be making a rapid recovery.  This was the first time he had used heroin.  He insists he will never use it again.  He has not been having any exertional chest pain at home.  He has had some left-sided chest discomfort brought on by coughing from his chronic bronchitis. Physical examination reveals increased AP diameter and distant breath sounds.  Cardiac exam reveals no murmur gallop or rub. Agree with assessment and plan as noted above.

## 2014-07-29 DIAGNOSIS — N179 Acute kidney failure, unspecified: Secondary | ICD-10-CM

## 2014-07-29 DIAGNOSIS — I369 Nonrheumatic tricuspid valve disorder, unspecified: Secondary | ICD-10-CM

## 2014-07-29 LAB — GLUCOSE, CAPILLARY
Glucose-Capillary: 118 mg/dL — ABNORMAL HIGH (ref 70–99)
Glucose-Capillary: 106 mg/dL — ABNORMAL HIGH (ref 70–99)
Glucose-Capillary: 135 mg/dL — ABNORMAL HIGH (ref 70–99)
Glucose-Capillary: 110 mg/dL — ABNORMAL HIGH (ref 70–99)
Glucose-Capillary: 73 mg/dL (ref 70–99)
Glucose-Capillary: 108 mg/dL — ABNORMAL HIGH (ref 70–99)
Glucose-Capillary: 106 mg/dL — ABNORMAL HIGH (ref 70–99)

## 2014-07-29 LAB — CK TOTAL AND CKMB (NOT AT ARMC)
CK TOTAL: 4962 U/L — AB (ref 7–232)
CK, MB: 9.2 ng/mL — AB (ref 0.3–4.0)
Relative Index: 0.2 (ref 0.0–2.5)

## 2014-07-29 LAB — CBC WITH DIFFERENTIAL/PLATELET
Basophils Absolute: 0 10*3/uL (ref 0.0–0.1)
Basophils Relative: 0 % (ref 0–1)
Eosinophils Absolute: 0.1 10*3/uL (ref 0.0–0.7)
Eosinophils Relative: 1 % (ref 0–5)
HCT: 32.7 % — ABNORMAL LOW (ref 39.0–52.0)
Hemoglobin: 11.1 g/dL — ABNORMAL LOW (ref 13.0–17.0)
Lymphocytes Relative: 13 % (ref 12–46)
Lymphs Abs: 1.4 10*3/uL (ref 0.7–4.0)
MCH: 32.7 pg (ref 26.0–34.0)
MCHC: 33.9 g/dL (ref 30.0–36.0)
MCV: 96.5 fL (ref 78.0–100.0)
MONO ABS: 1.2 10*3/uL — AB (ref 0.1–1.0)
Monocytes Relative: 11 % (ref 3–12)
NEUTROS PCT: 75 % (ref 43–77)
Neutro Abs: 8.1 10*3/uL — ABNORMAL HIGH (ref 1.7–7.7)
PLATELETS: 226 10*3/uL (ref 150–400)
RBC: 3.39 MIL/uL — AB (ref 4.22–5.81)
RDW: 14.4 % (ref 11.5–15.5)
WBC: 10.8 10*3/uL — AB (ref 4.0–10.5)

## 2014-07-29 LAB — BASIC METABOLIC PANEL
ANION GAP: 7 (ref 5–15)
BUN: 13 mg/dL (ref 6–23)
CALCIUM: 8.6 mg/dL (ref 8.4–10.5)
CO2: 22 mmol/L (ref 19–32)
CREATININE: 1.21 mg/dL (ref 0.50–1.35)
Chloride: 111 mEq/L (ref 96–112)
GFR, EST AFRICAN AMERICAN: 70 mL/min — AB (ref >90)
GFR, EST NON AFRICAN AMERICAN: 60 mL/min — AB (ref >90)
GLUCOSE: 121 mg/dL — AB (ref 70–99)
POTASSIUM: 3.8 mmol/L (ref 3.5–5.1)
Sodium: 140 mmol/L (ref 135–145)

## 2014-07-29 LAB — PROCALCITONIN: PROCALCITONIN: 0.66 ng/mL

## 2014-07-29 LAB — TROPONIN I: Troponin I: 0.29 ng/mL — ABNORMAL HIGH (ref <0.031)

## 2014-07-29 LAB — PHOSPHORUS: Phosphorus: 1.9 mg/dL — ABNORMAL LOW (ref 2.3–4.6)

## 2014-07-29 LAB — MAGNESIUM: Magnesium: 2.1 mg/dL (ref 1.5–2.5)

## 2014-07-29 MED ORDER — IPRATROPIUM-ALBUTEROL 0.5-2.5 (3) MG/3ML IN SOLN: 3.0000 mL | RESPIRATORY_TRACT | Status: DC | PRN

## 2014-07-29 MED ORDER — FOLIC ACID 1 MG PO TABS
1.0000 mg | ORAL_TABLET | Freq: Every day | ORAL | Status: DC
  Administered 2014-07-30: 1 mg via ORAL
  Filled 2014-07-29: qty 1

## 2014-07-29 MED ORDER — SODIUM CHLORIDE 0.9 % IV SOLN
INTRAVENOUS | Status: DC
  Administered 2014-07-29 (×2): via INTRAVENOUS

## 2014-07-29 MED ORDER — ACETAMINOPHEN 325 MG PO TABS
325.0000 mg | ORAL_TABLET | ORAL | Status: DC | PRN
  Administered 2014-07-29 – 2014-07-30 (×2): 325 mg via ORAL
  Filled 2014-07-29 (×2): qty 1

## 2014-07-29 MED ORDER — VITAMIN B-1 100 MG PO TABS
100.0000 mg | ORAL_TABLET | Freq: Every day | ORAL | Status: DC
  Administered 2014-07-30: 100 mg via ORAL
  Filled 2014-07-29: qty 1

## 2014-07-29 NOTE — Progress Notes (Signed)
Spoke with Dr. Titus Mould about the patient's ETOH use and 2 days since last drink. Pt exhibiting tremors in the hands with a headache, but no auditory or visual disturbances or changes in vitals. Due to the pt's reason for admission, CIWA protocol and benzos will be deferred for now.

## 2014-07-29 NOTE — Progress Notes (Signed)
*  PRELIMINARY RESULTS* Echocardiogram 2D Echocardiogram has been performed.  Leavy Cella 07/29/2014, 10:39 AM

## 2014-07-29 NOTE — Progress Notes (Signed)
[facility] critical lab value of 9.2. MD notified. Awaiting MD response.

## 2014-07-29 NOTE — Progress Notes (Signed)
CRITICAL VALUE ALERT  Critical value received:  9.2  Date of notification:  07/29/14  Time of notification:  03:30 am  Critical value read back: yes  Nurse who received alert:  Sonu Kruckenberg RN  MD notified (1st page):  Yes  Time of first page: 03:34  MD notified (2nd page):  yes Time of second page: 03:46 am  Responding MD: callahan  Time MD responded:  03:41

## 2014-07-29 NOTE — Progress Notes (Signed)
Patient ID: Christopher Orr, male   DOB: 1946-07-07, 69 y.o.   MRN: [id number]    Subjective:  Denies SSCP, palpitations or Dyspnea Wants to go home Echo being done   Objective:  Filed Vitals:   07/28/14 1528 07/28/14 2021 07/29/14 0413 07/29/14 0910  BP:  107/51 132/82   Pulse:  84 82   Temp:  97.9 F (36.6 C) 98.6 F (37 C)   TempSrc:  Oral Oral   Resp:  18 16   Height:      Weight:   116.257 kg (256 lb 4.8 oz)   SpO2: 97% 94% 95% 93%    Intake/Output from previous day:  Intake/Output Summary (Last 24 hours) at 07/29/14 0956 Last data filed at 07/29/14 0841  Gross per 24 hour  Intake   1435 ml  Output    901 ml  Net    534 ml    Physical Exam: Affect appropriate Healthy:  appears stated age HEENT: normal Neck supple with no adenopathy JVP normal no bruits no thyromegaly Lungs clear with no wheezing and good diaphragmatic motion Heart:  S1/S2 no murmur, no rub, gallop or click PMI normal Abdomen: benighn, BS positve, no tenderness, no AAA no bruit.  No HSM or HJR Distal pulses intact with no bruits No edema Neuro non-focal Skin warm and dry No muscular weakness   Lab Results: Basic Metabolic Panel:  Recent Labs  07/28/14 0235 07/29/14 0135  NA 139 140  K 3.9 3.8  CL 107 111  CO2 23 22  GLUCOSE 90 121*  BUN 19 13  CREATININE 1.68* 1.21  CALCIUM 8.6 8.6  MG 1.9 2.1  PHOS 1.9* 1.9*   Liver Function Tests:  Recent Labs  07/27/14 0218  AST 47*  ALT 23  ALKPHOS 112  BILITOT 0.3  PROT 7.9  ALBUMIN 4.2   CBC:  Recent Labs  07/27/14 0218  07/28/14 0235 07/29/14 0135  WBC 19.0*  < > 12.4* 10.8*  NEUTROABS 16.3*  --   --  8.1*  HGB 13.4  < > 11.4* 11.1*  HCT 42.2  < > 35.1* 32.7*  MCV 102.4*  < > 97.0 96.5  PLT 281  < > 225 226  < > = values in this interval not displayed. Cardiac Enzymes:  Recent Labs  07/27/14 0527  07/28/14 1013 07/28/14 1818 07/29/14 0135  CKTOTAL 4374*  --   --   --  4962*  CKMB  --   --   --   --  9.2*    TROPONINI  --   < > 0.72* 0.32* 0.29*  < > = values in this interval not displayed.  Imaging: Dg Chest Port 1 View  07/28/2014   CLINICAL DATA:  Airspace disease  EXAM: PORTABLE CHEST - 1 VIEW  COMPARISON:  the previous day's study  FINDINGS: Coarse mild interstitial opacities throughout both lungs as before. Patchy airspace opacities at the left lung base have slightly improved. Heart size upper limits normal for technique.  No effusion.  No pneumothorax. Visualized skeletal structures are unremarkable.  IMPRESSION: 1. Partial resolution of left lower lung airspace disease.   Electronically Signed   By: Arne Cleveland M.D.   On: 07/28/2014 08:41    Cardiac Studies:  ECG: SR PVC no acute ST changes    Telemetry: NSR no NSVT or PAF   Echo: being done now   Medications:   . antiseptic oral rinse  7 mL Mouth Rinse BID  . ARIPiprazole  10 mg Oral Daily  . aspirin EC  81 mg Oral Daily  . diazepam  5 mg Oral Daily  . famotidine  20 mg Oral Daily  . FLUoxetine  10 mg Oral Daily  . folic acid  1 mg Intravenous Daily  . gi cocktail  30 mL Oral Once  . heparin  5,000 Units Subcutaneous 3 times per day  . levofloxacin  500 mg Oral Daily  . pantoprazole  40 mg Oral Daily  . prasugrel  10 mg Oral Daily  . rosuvastatin  5 mg Oral QHS  . thiamine  100 mg Intravenous Daily  . traZODone  100 mg Oral QHS  . vitamin C  250 mg Oral Daily     . sodium chloride 50 mL/hr at 07/28/14 2107    Assessment/Plan:  Elevated troponin:  See note by Dr Mare Ferrari if echo ok no further inpatient w/u  On ASA and effient Chol:  On statin Drug Abuse: with respitory failure rhabdo  Counseled on heroin use   Jenkins Rouge 07/29/2014, 9:56 AM

## 2014-07-29 NOTE — Progress Notes (Signed)
Turner MD called, no new orders at this time.

## 2014-07-29 NOTE — Progress Notes (Signed)
Patient Demographics  Christopher Orr, is a 69 y.o. male, DOB - Feb 13, 1946, CZY:[id number]  Admit date - 07/27/2014   Admitting Physician Rush Farmer, MD  Outpatient Primary MD for the patient is No primary care provider on file.  LOS - 2   Chief Complaint  Patient presents with  . Altered Mental Status      Admission history of present illness/brief narrative: Christopher Orr is a 69 y/o M with history of COPD, CAD, GERD, depression, polysubstance abuse including alcohol/THC/heroin who was admitted with unresponsiveness and acute respiratory depression/failure. found unresponsive in his car. In the ED, respiratory rate was only 4 and he was moaning and groaning. He received IV narcan which helped. Initial workup c/w sepsis (hypotension, hypoxic, WBC 19, initial lactate 8) later felt due to being down, AKI/hyperkalemia with Cr 2.78, initial pH 7.1, UDS + benzos, opiates, THC, negative for cocaine.  Total CK 4374. CXR concerning for aspiration. Head CT nonacute. -Agent was in acute renal failure secondary to abdominal lysis, with significantly elevated total CK, creatinine continues to improve with IV hydration, troponin elevation was felt most likely secondary to demand ischemia from acute respiratory failure, and rhabdomyolysis and acute kidney injury.  Subjective:   Christopher Orr today has, No headache, No chest pain, No abdominal pain - No Nausea, No new weakness tingling or numbness, No Cough - SOB, wants to go home today.  Assessment & Plan    Active Problems:   Heroin overdose   Unresponsive   Chest pain   COPD (chronic obstructive pulmonary disease)   Anxiety   Lactic acidosis   Aspiration pneumonia   Elevated troponin I level   Acute kidney injury   GERD (gastroesophageal reflux disease)  Acute respiratory failure; - This is secondary to altered mental  status and drug overdose, currently resolved, patient is back on room air.  Acute metabolic encephalopathy -Resolved, CT head is nonacute, this is most likely related from sepsis have use and heroin overdose  Acute renal failure - This is in the setting of her abdominal lysis, continues to improve, continue with IV fluid.  Elevated troponin - Cardiology consult appreciated, this is most likely secondary to demand ischemia from acute respiratory failure, acute renal failure, rhabdomyolysis, and heroin use. - Echo showing EF 55% , no regional wall motion abnormalities. -No further workup as per cardio  Polysubstance abuse -Patient was counseled   SIRS/sepsis - Most likely in the setting of encephalopathy, respiratory failure., Aspiration pneumonia.   Questionable left lung infiltrate suspicious for aspiration pneumonia,  -continue with levofloxacin.  Code Status: Full  Family Communication: None at bedside  Disposition Plan: Home when stable   Procedures     Consults   Cardiology PC CM   Medications  Scheduled Meds: . antiseptic oral rinse  7 mL Mouth Rinse BID  . ARIPiprazole  10 mg Oral Daily  . aspirin EC  81 mg Oral Daily  . diazepam  5 mg Oral Daily  . famotidine  20 mg Oral Daily  . FLUoxetine  10 mg Oral Daily  . folic acid  1 mg Intravenous Daily  . gi cocktail  30 mL Oral Once  . heparin  5,000 Units Subcutaneous 3 times per day  .  levofloxacin  500 mg Oral Daily  . pantoprazole  40 mg Oral Daily  . prasugrel  10 mg Oral Daily  . rosuvastatin  5 mg Oral QHS  . thiamine  100 mg Intravenous Daily  . traZODone  100 mg Oral QHS  . vitamin C  250 mg Oral Daily   Continuous Infusions: . sodium chloride     PRN Meds:.acetaminophen, ipratropium-albuterol  DVT Prophylaxis  - Heparin -   Lab Results  Component Value Date   PLT 226 07/29/2014    Antibiotics    Anti-infectives    Start     Dose/Rate Route Frequency Ordered Stop   07/28/14 1100   levofloxacin (LEVAQUIN) tablet 500 mg     500 mg Oral Daily 07/28/14 1007 08/02/14 0959   07/27/14 1400  piperacillin-tazobactam (ZOSYN) IVPB 3.375 g  Status:  Discontinued     3.375 g12.5 mL/hr over 240 Minutes Intravenous 3 times per day 07/27/14 0534 07/28/14 1007   07/27/14 0545  piperacillin-tazobactam (ZOSYN) IVPB 3.375 g     3.375 g100 mL/hr over 30 Minutes Intravenous  Once 07/27/14 0534 07/27/14 0648   07/27/14 0445  levofloxacin (LEVAQUIN) IVPB 750 mg  Status:  Discontinued     750 mg100 mL/hr over 90 Minutes Intravenous  Once 07/27/14 0442 07/27/14 0522   07/27/14 0445  cefTRIAXone (ROCEPHIN) 1 g in dextrose 5 % 50 mL IVPB     1 g100 mL/hr over 30 Minutes Intravenous  Once 07/27/14 0443 07/27/14 0559          Objective:   Filed Vitals:   07/28/14 1528 07/28/14 2021 07/29/14 0413 07/29/14 0910  BP:  107/51 132/82   Pulse:  84 82   Temp:  97.9 F (36.6 C) 98.6 F (37 C)   TempSrc:  Oral Oral   Resp:  18 16   Height:      Weight:   116.257 kg (256 lb 4.8 oz)   SpO2: 97% 94% 95% 93%    Wt Readings from Last 3 Encounters:  07/29/14 116.257 kg (256 lb 4.8 oz)     Intake/Output Summary (Last 24 hours) at 07/29/14 1131 Last data filed at 07/29/14 0841  Gross per 24 hour  Intake    685 ml  Output    551 ml  Net    134 ml     Physical Exam  Awake Alert, Oriented X 3, No new F.N deficits, Normal affect [place].AT,PERRAL Supple Neck,No JVD, No cervical lymphadenopathy appriciated.  Symmetrical Chest wall movement, Good air movement bilaterally, CTAB RRR,No Gallops,Rubs or new Murmurs, No Parasternal Heave +ve B.Sounds, Abd Soft, No tenderness, No organomegaly appriciated, No rebound - guarding or rigidity. No Cyanosis, Clubbing or edema, No new Rash or bruise     Data Review   Micro Results Recent Results (from the past 240 hour(s))  MRSA PCR Screening     Status: None   Collection Time: 07/27/14  7:56 AM  Result Value Ref Range Status   MRSA by PCR NEGATIVE  NEGATIVE Final    Comment:        The GeneXpert MRSA Assay (FDA approved for NASAL specimens only), is one component of a comprehensive MRSA colonization surveillance program. It is not intended to diagnose MRSA infection nor to guide or monitor treatment for MRSA infections.   Culture, expectorated sputum-assessment     Status: None   Collection Time: 07/28/14  2:40 AM  Result Value Ref Range Status   Specimen Description SPUTUM  Final  Special Requests NONE  Final   Sputum evaluation   Final    THIS SPECIMEN IS ACCEPTABLE. RESPIRATORY CULTURE REPORT TO FOLLOW.   Report Status 07/28/2014 FINAL  Final    Radiology Reports Dg Chest Port 1 View  07/28/2014   CLINICAL DATA:  Airspace disease  EXAM: PORTABLE CHEST - 1 VIEW  COMPARISON:  the previous day's study  FINDINGS: Coarse mild interstitial opacities throughout both lungs as before. Patchy airspace opacities at the left lung base have slightly improved. Heart size upper limits normal for technique.  No effusion.  No pneumothorax. Visualized skeletal structures are unremarkable.  IMPRESSION: 1. Partial resolution of left lower lung airspace disease.   Electronically Signed   By: Arne Cleveland M.D.   On: 07/28/2014 08:41    CBC  Recent Labs Lab 07/27/14 0218 07/27/14 0529 07/28/14 0235 07/29/14 0135  WBC 19.0* 15.4* 12.4* 10.8*  HGB 13.4 12.9* 11.4* 11.1*  HCT 42.2 39.9 35.1* 32.7*  PLT 281 271 225 226  MCV 102.4* 101.3* 97.0 96.5  MCH 32.5 32.7 31.5 32.7  MCHC 31.8 32.3 32.5 33.9  RDW 14.6 14.3 14.4 14.4  LYMPHSABS 1.6  --   --  1.4  MONOABS 1.0  --   --  1.2*  EOSABS 0.0  --   --  0.1  BASOSABS 0.0  --   --  0.0    Chemistries   Recent Labs Lab 07/27/14 0218  07/27/14 0847 07/27/14 1400 07/27/14 1947 07/28/14 0235 07/29/14 0135  NA 142  --  140 140 138 139 140  K 5.6*  --  4.9 4.6 4.0 3.9 3.8  CL 106  --  109 108 110 107 111  CO2 22  --  21 23 24 23 22   GLUCOSE 143*  --  103* 107* 103* 90 121*  BUN  16  --  22 22 23 19 13   CREATININE 2.78*  < > 2.58* 2.38* 1.96* 1.68* 1.21  CALCIUM 9.0  --  8.4 8.3* 8.3* 8.6 8.6  MG  --   --   --   --   --  1.9 2.1  AST 47*  --   --   --   --   --   --   ALT 23  --   --   --   --   --   --   ALKPHOS 112  --   --   --   --   --   --   BILITOT 0.3  --   --   --   --   --   --   < > = values in this interval not displayed. ------------------------------------------------------------------------------------------------------------------ estimated creatinine clearance is 78 mL/min (by C-G formula based on Cr of 1.21). ------------------------------------------------------------------------------------------------------------------ No results for input(s): HGBA1C in the last 72 hours. ------------------------------------------------------------------------------------------------------------------ No results for input(s): CHOL, HDL, LDLCALC, TRIG, CHOLHDL, LDLDIRECT in the last 72 hours. ------------------------------------------------------------------------------------------------------------------ No results for input(s): TSH, T4TOTAL, T3FREE, THYROIDAB in the last 72 hours.  Invalid input(s): FREET3 ------------------------------------------------------------------------------------------------------------------ No results for input(s): VITAMINB12, FOLATE, FERRITIN, TIBC, IRON, RETICCTPCT in the last 72 hours.  Coagulation profile No results for input(s): INR, PROTIME in the last 168 hours.  No results for input(s): DDIMER in the last 72 hours.  Cardiac Enzymes  Recent Labs Lab 07/28/14 1013 07/28/14 1818 07/29/14 0135  CKMB  --   --  9.2*  TROPONINI 0.72* 0.32* 0.29*   ------------------------------------------------------------------------------------------------------------------ Invalid input(s): POCBNP     Time Spent  in minutes   35 minutes   Rosanne Wohlfarth M.D on 07/29/2014 at 11:31 AM  Between 7am to 7pm - Pager -  [phone]  After 7pm go to [email or web address] - password [email or web address]  And look for the night coverage person covering for me after hours  [facility] Office  [phone]   **Disclaimer: This note may have been dictated with voice recognition software. Similar sounding words can inadvertently be transcribed and this note may contain transcription errors which may not have been corrected upon publication of note.**

## 2014-07-30 DIAGNOSIS — R072 Precordial pain: Secondary | ICD-10-CM

## 2014-07-30 LAB — CBC WITH DIFFERENTIAL/PLATELET
BASOS ABS: 0 10*3/uL (ref 0.0–0.1)
Basophils Relative: 0 % (ref 0–1)
EOS PCT: 2 % (ref 0–5)
Eosinophils Absolute: 0.1 10*3/uL (ref 0.0–0.7)
HEMATOCRIT: 32.5 % — AB (ref 39.0–52.0)
Hemoglobin: 11.1 g/dL — ABNORMAL LOW (ref 13.0–17.0)
LYMPHS ABS: 2.1 10*3/uL (ref 0.7–4.0)
Lymphocytes Relative: 23 % (ref 12–46)
MCH: 32.9 pg (ref 26.0–34.0)
MCHC: 34.2 g/dL (ref 30.0–36.0)
MCV: 96.4 fL (ref 78.0–100.0)
Monocytes Absolute: 1 10*3/uL (ref 0.1–1.0)
Monocytes Relative: 11 % (ref 3–12)
NEUTROS PCT: 64 % (ref 43–77)
Neutro Abs: 6 10*3/uL (ref 1.7–7.7)
PLATELETS: 223 10*3/uL (ref 150–400)
RBC: 3.37 MIL/uL — ABNORMAL LOW (ref 4.22–5.81)
RDW: 14.4 % (ref 11.5–15.5)
WBC: 9.3 10*3/uL (ref 4.0–10.5)

## 2014-07-30 LAB — BASIC METABOLIC PANEL
ANION GAP: 6 (ref 5–15)
BUN: 9 mg/dL (ref 6–23)
CO2: 21 mmol/L (ref 19–32)
CREATININE: 1.1 mg/dL (ref 0.50–1.35)
Calcium: 8.3 mg/dL — ABNORMAL LOW (ref 8.4–10.5)
Chloride: 112 mEq/L (ref 96–112)
GFR calc Af Amer: 78 mL/min — ABNORMAL LOW (ref >90)
GFR, EST NON AFRICAN AMERICAN: 68 mL/min — AB (ref >90)
GLUCOSE: 103 mg/dL — AB (ref 70–99)
POTASSIUM: 3.6 mmol/L (ref 3.5–5.1)
Sodium: 139 mmol/L (ref 135–145)

## 2014-07-30 LAB — CK TOTAL AND CKMB (NOT AT ARMC)
CK, MB: 3.4 ng/mL (ref 0.3–4.0)
Relative Index: 0.2 (ref 0.0–2.5)
Total CK: 2230 U/L — ABNORMAL HIGH (ref 7–232)

## 2014-07-30 LAB — PHOSPHORUS: Phosphorus: 2.7 mg/dL (ref 2.3–4.6)

## 2014-07-30 LAB — MAGNESIUM: MAGNESIUM: 1.9 mg/dL (ref 1.5–2.5)

## 2014-07-30 LAB — GLUCOSE, CAPILLARY
GLUCOSE-CAPILLARY: 118 mg/dL — AB (ref 70–99)
GLUCOSE-CAPILLARY: 97 mg/dL (ref 70–99)

## 2014-07-30 LAB — CULTURE, RESPIRATORY: Culture: NORMAL

## 2014-07-30 MED ORDER — LEVOFLOXACIN 500 MG PO TABS: 500.0000 mg | ORAL_TABLET | Freq: Every day | ORAL | Status: DC

## 2014-07-30 MED ORDER — FOLIC ACID 1 MG PO TABS: 1.0000 mg | ORAL_TABLET | Freq: Every day | ORAL | Status: DC

## 2014-07-30 MED ORDER — THIAMINE HCL 100 MG PO TABS: 100.0000 mg | ORAL_TABLET | Freq: Every day | ORAL | Status: DC

## 2014-07-30 MED ORDER — POTASSIUM CHLORIDE CRYS ER 20 MEQ PO TBCR
40.0000 meq | EXTENDED_RELEASE_TABLET | Freq: Once | ORAL | Status: AC
Start: 2014-07-30 — End: 2014-07-30
  Administered 2014-07-30: 40 meq via ORAL
  Filled 2014-07-30: qty 2

## 2014-07-30 MED ORDER — LEVOFLOXACIN 500 MG PO TABS
500.0000 mg | ORAL_TABLET | Freq: Every day | ORAL | Status: DC
Start: 2014-07-30 — End: 2014-07-30

## 2014-07-30 MED ORDER — DIAZEPAM 10 MG PO TABS: 5.0000 mg | ORAL_TABLET | Freq: Every day | ORAL | Status: DC

## 2014-07-30 NOTE — Discharge Summary (Signed)
Christopher Orr, 69 y.o., DOB [birth year], MRN [id number]. Admission date: 07/27/2014 Discharge Date 07/30/2014 Primary MD No primary care provider on file. Admitting Physician Rush Farmer, MD  Admission Diagnosis  Unresponsive [R40.4]  Discharge Diagnosis   Active Problems:   Heroin overdose   Unresponsive   Chest pain   COPD (chronic obstructive pulmonary disease)   Anxiety   Lactic acidosis   Aspiration pneumonia   Elevated troponin I level   Acute kidney injury   GERD (gastroesophageal reflux disease)     PCP please check: CBC, BMP, chest x-ray during next visit  Past Medical History  Diagnosis Date  . History of lumbar fusion   . Depression   . COPD (chronic obstructive pulmonary disease)   . Drug use   . Alcohol abuse   . CAD (coronary artery disease)     a. s/p stent to LAD in 2006 at [facility], EF 60% at that time.    Past Surgical History  Procedure Laterality Date  . Lumbar fusion    . Tonsillectomy       Hospital Course See H&P, Labs, Consult and Test reports for all details in brief, patient was admitted for   Active Problems:   Heroin overdose   Unresponsive   Chest pain   COPD (chronic obstructive pulmonary disease)   Anxiety   Lactic acidosis   Aspiration pneumonia   Elevated troponin I level   Acute kidney injury   GERD (gastroesophageal reflux disease)    Admission history of present illness/brief narrative: Christopher Orr is a 69 y/o M with history of COPD, CAD, GERD, depression, polysubstance abuse including alcohol/THC/heroin who was admitted with unresponsiveness and acute respiratory depression/failure. found unresponsive in his car. In the ED, respiratory rate was only 4 and he was moaning and groaning. He received IV narcan which helped. Initial workup c/w sepsis (hypotension, hypoxic, WBC 19, initial lactate 8) later felt due to being down, AKI/hyperkalemia with Cr 2.78, initial pH 7.1, UDS + benzos, opiates, THC, negative for cocaine. Total  CK 4374. CXR concerning for aspiration. Head CT nonacute. -Agent was in acute renal failure secondary to abdominal lysis, with significantly elevated total CK, creatinine continues to improve with IV hydration, back to baseline at day of discharge, troponin elevation was felt most likely secondary to demand ischemia from acute respiratory failure, and rhabdomyolysis and acute kidney injury.  Acute respiratory failure; - This is secondary to altered mental status and drug overdose, currently resolved, patient is back on room air.  Acute metabolic encephalopathy -Resolved, CT head is nonacute, this is most likely related from sepsis have use and heroin overdose  Acute renal failure - This is in the setting of rhabdomyolysis, resolved, creatinine is 3.6 at day of discharge.  Elevated troponin - Cardiology consult appreciated, this is most likely secondary to demand ischemia from acute respiratory failure, acute renal failure, rhabdomyolysis, and heroin use. - Echo showing EF 55% , no regional wall motion abnormalities. -No further workup as per cardio  Polysubstance abuse/ narrowing use -Patient was counseled   SIRS/sepsis - Most likely in the setting of encephalopathy, respiratory failure., Aspiration pneumonia.   Questionable left lung infiltrate suspicious for aspiration pneumonia,  -continue with levofloxacin to finish total of 7 days on discharge.  Alcohol abuse - Patient does not have any evidence of DTs during hospitalization, will discharge on folic acid and thiamine, Valium dose was decreased during hospitalization to 5 mg oral daily, patient will be given a prescription for 15  tablets of 5 mg of Valium.   Consults   Cardiology Pulmonary critical care  Significant Tests:  See full reports for all details    Ct Head Wo Contrast  07/27/2014   CLINICAL DATA:  Found unresponsive in vehicle around 1:30 a.m., altered mental status.  EXAM: CT HEAD WITHOUT CONTRAST  TECHNIQUE:  Contiguous axial images were obtained from the base of the skull through the vertex without intravenous contrast.  COMPARISON:  None.  FINDINGS: The ventricles and sulci are normal for age. No intraparenchymal hemorrhage, mass effect nor midline shift. Patchy supratentorial white matter hypodensities are less than expected for patient's age and though non-specific suggest sequelae of chronic small vessel ischemic disease. No acute large vascular territory infarcts.  No abnormal extra-axial fluid collections. Basal cisterns are patent. Mild calcific atherosclerosis of the carotid siphons.  No skull fracture. 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 The included ocular globes and orbital contents are non-suspicious. While the frontal sinus mucosal thickening without paranasal sinus air-fluid levels. Soft tissue within the external auditory canals most consistent with cerumen.  IMPRESSION: No acute intracranial process ; normal noncontrast CT of the head for age.  Mild paranasal sinusitis.   Electronically Signed   By: Elon Alas   On: 07/27/2014 04:17   Dg Chest Port 1 View  07/28/2014   CLINICAL DATA:  Airspace disease  EXAM: PORTABLE CHEST - 1 VIEW  COMPARISON:  the previous day's study  FINDINGS: Coarse mild interstitial opacities throughout both lungs as before. Patchy airspace opacities at the left lung base have slightly improved. Heart size upper limits normal for technique.  No effusion.  No pneumothorax. Visualized skeletal structures are unremarkable.  IMPRESSION: 1. Partial resolution of left lower lung airspace disease.   Electronically Signed   By: Arne Cleveland M.D.   On: 07/28/2014 08:41   Dg Chest Portable 1 View  07/27/2014   CLINICAL DATA:  Unresponsive. Question drug overdose none currently available  EXAM: PORTABLE CHEST - 1 VIEW  COMPARISON:  None.  FINDINGS: Indistinct basilar opacities, left greater than right. No edema. Aside from the excluded lateral left  costophrenic sulcus, no effusion or pneumothorax. Normal heart size and mediastinal contours for technique (shadow inspiration and portable exam).  IMPRESSION: Left greater than right basilar lung opacification. History of unresponsiveness favors aspiration pneumonitis or pneumonia.   Electronically Signed   By: Jorje Guild M.D.   On: 07/27/2014 03:02     Today   Subjective:   Christopher Orr today has no headache,no chest abdominal pain,no new weakness tingling or numbness, feels much better wants to go home today.   Objective:   Blood pressure 138/78, pulse 86, temperature 98.8 F (37.1 C), temperature source Oral, resp. rate 18, height 6' (1.829 m), weight 116.3 kg (256 lb 6.3 oz), SpO2 93 %.  Intake/Output Summary (Last 24 hours) at 07/30/14 1038 Last data filed at 07/30/14 0100  Gross per 24 hour  Intake    600 ml  Output   1200 ml  Net   -600 ml    Exam Awake Alert, Oriented *3, No new F.N deficits, Normal affect [place].AT,PERRAL Supple Neck,No JVD, No cervical lymphadenopathy appriciated.  Symmetrical Chest wall movement, Good air movement bilaterally, CTAB RRR,No Gallops,Rubs or new Murmurs, No Parasternal Heave +ve B.Sounds, Abd Soft, Non tender, No organomegaly appriciated, No rebound -guarding or rigidity. No Cyanosis, Clubbing or edema, No new Rash or bruise  Data Review     CBC w Diff:  Lab Results  Component Value Date   WBC 9.3 07/30/2014   HGB 11.1* 07/30/2014   HCT 32.5* 07/30/2014   PLT 223 07/30/2014   LYMPHOPCT 23 07/30/2014   MONOPCT 11 07/30/2014   EOSPCT 2 07/30/2014   BASOPCT 0 07/30/2014   CMP:  Lab Results  Component Value Date   NA 139 07/30/2014   K 3.6 07/30/2014   CL 112 07/30/2014   CO2 21 07/30/2014   BUN 9 07/30/2014   CREATININE 1.10 07/30/2014   PROT 7.9 07/27/2014   ALBUMIN 4.2 07/27/2014   BILITOT 0.3 07/27/2014   ALKPHOS 112 07/27/2014   AST 47* 07/27/2014   ALT 23 07/27/2014  .  Micro Results Recent Results (from  the past 240 hour(s))  MRSA PCR Screening     Status: None   Collection Time: 07/27/14  7:56 AM  Result Value Ref Range Status   MRSA by PCR NEGATIVE NEGATIVE Final    Comment:        The GeneXpert MRSA Assay (FDA approved for NASAL specimens only), is one component of a comprehensive MRSA colonization surveillance program. It is not intended to diagnose MRSA infection nor to guide or monitor treatment for MRSA infections.   Culture, expectorated sputum-assessment     Status: None   Collection Time: 07/28/14  2:40 AM  Result Value Ref Range Status   Specimen Description SPUTUM  Final   Special Requests NONE  Final   Sputum evaluation   Final    THIS SPECIMEN IS ACCEPTABLE. RESPIRATORY CULTURE REPORT TO FOLLOW.   Report Status 07/28/2014 FINAL  Final     Discharge Instructions     Please check CBC, BMP, chest x-ray during next visit.   Discharge Medications     Medication List    STOP taking these medications        ALKA-SELTZER HEARTBURN PO      TAKE these medications        ARIPiprazole 10 MG tablet  Commonly known as:  ABILIFY  Take 10 mg by mouth daily.     B-12 PO  Take 1 tablet by mouth daily.     CRESTOR 5 MG tablet  Generic drug:  rosuvastatin  Take 5 mg by mouth at bedtime.     diazepam 10 MG tablet  Commonly known as:  VALIUM  Take 0.5 tablets (5 mg total) by mouth daily.     EFFIENT 10 MG Tabs tablet  Generic drug:  prasugrel  Take 10 mg by mouth daily.     FLUoxetine 10 MG capsule  Commonly known as:  PROZAC  Take 10 mg by mouth daily.     folic acid 1 MG tablet  Commonly known as:  FOLVITE  Take 1 tablet (1 mg total) by mouth daily.     levofloxacin 500 MG tablet  Commonly known as:  LEVAQUIN  Take 1 tablet (500 mg total) by mouth daily.     multivitamin with minerals tablet  Take 1 tablet by mouth daily.     NEXIUM 40 MG capsule  Generic drug:  esomeprazole  Take 40 mg by mouth. every morning 30-60 minutes prior to eating.      thiamine 100 MG tablet  Take 1 tablet (100 mg total) by mouth daily.     traZODone 100 MG tablet  Commonly known as:  DESYREL  Take 100 mg by mouth at bedtime.     vitamin C 100 MG tablet  Take 100 mg by mouth daily.  Total Time in preparing paper work, data evaluation and todays exam - 35 minutes  Sandi Towe M.D on 07/30/2014 at 10:38 AM  [facility] Office  [phone]

## 2014-07-30 NOTE — Progress Notes (Signed)
Discharge instructions reviewed with patient and daughter. Both verbalized understanding. IV removed along with telemetry. Patient also verbalized understanding of needing to obtain f/u appointment with cardiologist within 3 days. Alfredo Bach RN BSN 07/30/2014 11:24 AM

## 2014-07-30 NOTE — Progress Notes (Signed)
Patient ID: DELYLE WEIDER, male   DOB: [birth year], 69 y.o.   MRN: [id number]    Subjective:  Denies SSCP, palpitations or Dyspnea  Echo reviewed normal EF    Objective:  Filed Vitals:   07/29/14 0910 07/29/14 1329 07/29/14 2010 07/30/14 0413  BP:  136/81 113/69 138/78  Pulse:  78 82 86  Temp:  98.3 F (36.8 C) 99 F (37.2 C) 98.8 F (37.1 C)  TempSrc:  Oral Oral Oral  Resp:  17 16 18   Height:      Weight:    116.3 kg (256 lb 6.3 oz)  SpO2: 93% 93% 94% 93%    Intake/Output from previous day:  Intake/Output Summary (Last 24 hours) at 07/30/14 1043 Last data filed at 07/30/14 0100  Gross per 24 hour  Intake    600 ml  Output   1200 ml  Net   -600 ml    Physical Exam: Affect appropriate Healthy:  appears stated age HEENT: normal Neck supple with no adenopathy JVP normal no bruits no thyromegaly Lungs clear with no wheezing and good diaphragmatic motion Heart:  S1/S2 no murmur, no rub, gallop or click PMI normal Abdomen: benighn, BS positve, no tenderness, no AAA no bruit.  No HSM or HJR Distal pulses intact with no bruits No edema Neuro non-focal Skin warm and dry No muscular weakness   Lab Results: Basic Metabolic Panel:  Recent Labs  07/29/14 0135 07/30/14 0405  NA 140 139  K 3.8 3.6  CL 111 112  CO2 22 21  GLUCOSE 121* 103*  BUN 13 9  CREATININE 1.21 1.10  CALCIUM 8.6 8.3*  MG 2.1 1.9  PHOS 1.9* 2.7   Liver Function Tests: No results for input(s): AST, ALT, ALKPHOS, BILITOT, PROT, ALBUMIN in the last 72 hours. CBC:  Recent Labs  07/29/14 0135 07/30/14 0405  WBC 10.8* 9.3  NEUTROABS 8.1* 6.0  HGB 11.1* 11.1*  HCT 32.7* 32.5*  MCV 96.5 96.4  PLT 226 223   Cardiac Enzymes:  Recent Labs  07/28/14 1013 07/28/14 1818 07/29/14 0135 07/30/14 0405  CKTOTAL  --   --  4962* 2230*  CKMB  --   --  9.2* 3.4  TROPONINI 0.72* 0.32* 0.29*  --     Imaging: No results found.  Cardiac Studies:  ECG: SR PVC no acute ST changes     Telemetry: NSR no NSVT or PAF   Echo:Study Conclusions  - Left ventricle: The cavity size was normal. There was mild concentric hypertrophy. Systolic function was normal. The estimated ejection fraction was 55%. Wall motion was normal; there were no regional wall motion abnormalities.  Medications:   . antiseptic oral rinse  7 mL Mouth Rinse BID  . ARIPiprazole  10 mg Oral Daily  . aspirin EC  81 mg Oral Daily  . diazepam  5 mg Oral Daily  . famotidine  20 mg Oral Daily  . FLUoxetine  10 mg Oral Daily  . folic acid  1 mg Oral Daily  . gi cocktail  30 mL Oral Once  . heparin  5,000 Units Subcutaneous 3 times per day  . levofloxacin  500 mg Oral Daily  . pantoprazole  40 mg Oral Daily  . prasugrel  10 mg Oral Daily  . rosuvastatin  5 mg Oral QHS  . thiamine  100 mg Oral Daily  . traZODone  100 mg Oral QHS  . vitamin C  250 mg Oral Daily     . sodium  chloride 100 mL/hr at 07/29/14 2207    Assessment/Plan:  Elevated troponin:  See note by Dr Mare Ferrari  No cath echo normal EF no  RWMA ok to d/c   On ASA and effient Chol:  On statin Drug Abuse: with respitory failure rhabdo  Counseled on heroin use   Being d/c by primary service   Jenkins Rouge 07/30/2014, 10:43 AM

## 2014-07-30 NOTE — Discharge Instructions (Signed)
Follow with Primary MD  in 7 days  ° °Get CBC, CMP, 2 view Chest X ray checked  by Primary MD next visit.  ° ° °Activity: As tolerated with Full fall precautions use walker/cane & assistance as needed ° ° °Disposition Home  ° ° °Diet: Heart Healthy  , with feeding assistance and aspiration precautions as needed. ° °For Heart failure patients - Check your Weight same time everyday, if you gain over 2 pounds, or you develop in leg swelling, experience more shortness of breath or chest pain, call your Primary MD immediately. Follow Cardiac Low Salt Diet and 1.8 lit/day fluid restriction. ° ° °On your next visit with your primary care physician please Get Medicines reviewed and adjusted. ° ° °Please request your Prim.MD to go over all Hospital Tests and Procedure/Radiological results at the follow up, please get all Hospital records sent to your Prim MD by signing hospital release before you go home. ° ° °If you experience worsening of your admission symptoms, develop shortness of breath, life threatening emergency, suicidal or homicidal thoughts you must seek medical attention immediately by calling 911 or calling your MD immediately  if symptoms less severe. ° °You Must read complete instructions/literature along with all the possible adverse reactions/side effects for all the Medicines you take and that have been prescribed to you. Take any new Medicines after you have completely understood and accpet all the possible adverse reactions/side effects.  ° °Do not drive, operating heavy machinery, perform activities at heights, swimming or participation in water activities or provide baby sitting services if your were admitted for syncope or siezures until you have seen by Primary MD or a Neurologist and advised to do so again. ° °Do not drive when taking Pain medications.  ° ° °Do not take more than prescribed Pain, Sleep and Anxiety Medications ° °Special Instructions: If you have smoked or chewed Tobacco  in the last  2 yrs please stop smoking, stop any regular Alcohol  and or any Recreational drug use. ° °Wear Seat belts while driving. ° ° °Please note ° °You were cared for by a hospitalist during your hospital stay. If you have any questions about your discharge medications or the care you received while you were in the hospital after you are discharged, you can call the unit and asked to speak with the hospitalist on call if the hospitalist that took care of you is not available. Once you are discharged, your primary care physician will handle any further medical issues. Please note that NO REFILLS for any discharge medications will be authorized once you are discharged, as it is imperative that you return to your primary care physician (or establish a relationship with a primary care physician if you do not have one) for your aftercare needs so that they can reassess your need for medications and monitor your lab values. ° °

## 2014-08-02 LAB — CULTURE, BLOOD (ROUTINE X 2)
Culture: NO GROWTH
Culture: NO GROWTH

## 2014-09-24 DIAGNOSIS — B182 Chronic viral hepatitis C: Secondary | ICD-10-CM | POA: Insufficient documentation

## 2014-09-24 HISTORY — DX: Chronic viral hepatitis C: B18.2

## 2014-10-03 ENCOUNTER — Ambulatory Visit: Payer: Self-pay | Admitting: Gastroenterology

## 2014-10-16 ENCOUNTER — Ambulatory Visit: Payer: Self-pay | Admitting: Gastroenterology

## 2014-11-17 NOTE — Op Note (Signed)
PATIENT NAME:  Christopher Orr, Christopher Orr MR#:  [id number] DATE OF BIRTH:  1946-04-09  DATE OF PROCEDURE:  06/28/2013  PREOPERATIVE DIAGNOSIS: Visually significant cataract of the right eye.   POSTOPERATIVE DIAGNOSIS: Visually significant cataract of the right eye.   OPERATIVE PROCEDURE: Cataract extraction by phacoemulsification with implant of intraocular lens to the right eye.   SURGEON: Birder Robson, MD.   ANESTHESIA:  1. Managed anesthesia care.  2. Topical tetracaine drops followed by 2% Xylocaine jelly applied in the preoperative holding area.   COMPLICATIONS: None.   TECHNIQUE:  Stop and chop.   DESCRIPTION OF PROCEDURE: The patient was examined and consented in the preoperative holding area where the aforementioned topical anesthesia was applied to the right eye and then brought back to the Operating Room where the right eye was prepped and draped in the usual sterile ophthalmic fashion and a lid speculum was placed. A paracentesis was created with the side port blade and the anterior chamber was filled with viscoelastic. A near clear corneal incision was performed with the steel keratome. A continuous curvilinear capsulorrhexis was performed with a cystotome followed by the capsulorrhexis forceps. Hydrodissection and hydrodelineation were carried out with BSS on a blunt cannula. The lens was removed in a stop and chop technique and the remaining cortical material was removed with the irrigation-aspiration handpiece. The capsular bag was inflated with viscoelastic and the Tecnis ZCB00 21.5-diopter lens, serial number [id number] was placed in the capsular bag without complication. The remaining viscoelastic was removed from the eye with the irrigation-aspiration handpiece. The wounds were hydrated. The anterior chamber was flushed with Miostat and the eye was inflated to physiologic pressure. 0.1 mL of cefuroxime concentration 10 mg/mL was placed in the anterior chamber. The wounds were found to  be water tight. The eye was dressed with Vigamox. The patient was given protective glasses to wear throughout the day and a shield with which to sleep tonight. The patient was also given drops with which to begin a drop regimen today and will follow-up with me in one day.   ____________________________ Livingston Diones. Abriel Geesey, MD wlp:gb D: 06/28/2013 21:30:29 ET T: 06/28/2013 21:42:57 ET JOB#: [id number]  cc: Amr Sturtevant L. Ewan Grau, MD, <Dictator> Livingston Diones Diera Wirkkala MD ELECTRONICALLY SIGNED 06/30/2013 9:56

## 2014-11-19 NOTE — Op Note (Signed)
PATIENT NAME:  Christopher Orr, Christopher Orr MR#:  [id number] DATE OF BIRTH:  09-30-1945  DATE OF PROCEDURE:  01/06/2012  PREOPERATIVE DIAGNOSIS: Visually significant cataract of the left eye.   POSTOPERATIVE DIAGNOSIS: Visually significant cataract of the left eye.   OPERATIVE PROCEDURE: Cataract extraction by phacoemulsification with implant of intraocular lens to left eye.   SURGEON: Birder Robson, MD.   ANESTHESIA:  1. Managed anesthesia care.  2. Topical tetracaine drops followed by 2% Xylocaine jelly applied in the preoperative holding area.   COMPLICATIONS: None.   TECHNIQUE:  Stop-and-chop    DESCRIPTION OF PROCEDURE: The patient was examined and consented in the preoperative holding area where the aforementioned topical anesthesia was applied to the left eye and then brought back to the Operating Room where the left eye was prepped and draped in the usual sterile ophthalmic fashion and a lid speculum was placed. A paracentesis was created with the side port blade and the anterior chamber was filled with viscoelastic. A near clear corneal incision was performed with the steel keratome. A continuous curvilinear capsulorrhexis was performed with a cystotome followed by the capsulorrhexis forceps. Hydrodissection and hydrodelineation were carried out with BSS on a blunt cannula. The lens was removed in a stop-and-chop technique and the remaining cortical material was removed with the irrigation-aspiration handpiece. The capsular bag was inflated with viscoelastic and the Technus ZCB00 21.5-diopter lens, serial number [id number] was placed in the capsular bag without complication. The remaining viscoelastic was removed from the eye with the irrigation-aspiration handpiece. The wounds were hydrated. The anterior chamber was flushed with Miostat and the eye was inflated to physiologic pressure. The wounds were found to be water tight. The eye was dressed with Vigamox. The patient was given protective  glasses to wear throughout the day and a shield with which to sleep tonight. The patient was also given drops with which to begin a drop regimen today and will follow-up with me in one day.   ____________________________ Livingston Diones. Joud Pettinato, MD wlp:drc D: 01/06/2012 12:32:40 ET T: 01/06/2012 13:05:37 ET JOB#: [id number]  cc: Damonte Frieson L. Lavell Supple, MD, <Dictator> Livingston Diones Dal Blew MD ELECTRONICALLY SIGNED 01/08/2012 12:12

## 2014-11-20 LAB — SURGICAL PATHOLOGY

## 2014-12-21 ENCOUNTER — Encounter: Payer: Self-pay | Admitting: *Deleted

## 2014-12-22 ENCOUNTER — Ambulatory Visit
Admission: RE | Admit: 2014-12-22 | Discharge: 2014-12-22 | Disposition: A | Discharge status: Home Health | Payer: PPO | Source: Ambulatory Visit | Attending: Gastroenterology | Admitting: Gastroenterology

## 2014-12-22 ENCOUNTER — Ambulatory Visit: Payer: PPO | Admitting: Anesthesiology

## 2014-12-22 ENCOUNTER — Encounter
Admission: RE | Disposition: A | Discharge status: Home Health | Payer: Self-pay | Source: Ambulatory Visit | Attending: Gastroenterology

## 2014-12-22 DIAGNOSIS — Z79899 Other long term (current) drug therapy: Secondary | ICD-10-CM | POA: Insufficient documentation

## 2014-12-22 DIAGNOSIS — J449 Chronic obstructive pulmonary disease, unspecified: Secondary | ICD-10-CM | POA: Insufficient documentation

## 2014-12-22 DIAGNOSIS — I2510 Atherosclerotic heart disease of native coronary artery without angina pectoris: Secondary | ICD-10-CM | POA: Insufficient documentation

## 2014-12-22 DIAGNOSIS — K7460 Unspecified cirrhosis of liver: Secondary | ICD-10-CM | POA: Diagnosis present

## 2014-12-22 DIAGNOSIS — B1920 Unspecified viral hepatitis C without hepatic coma: Secondary | ICD-10-CM | POA: Diagnosis not present

## 2014-12-22 DIAGNOSIS — K598 Other specified functional intestinal disorders: Secondary | ICD-10-CM | POA: Insufficient documentation

## 2014-12-22 DIAGNOSIS — E785 Hyperlipidemia, unspecified: Secondary | ICD-10-CM | POA: Insufficient documentation

## 2014-12-22 DIAGNOSIS — K5790 Diverticulosis of intestine, part unspecified, without perforation or abscess without bleeding: Secondary | ICD-10-CM | POA: Insufficient documentation

## 2014-12-22 DIAGNOSIS — K2970 Gastritis, unspecified, without bleeding: Secondary | ICD-10-CM | POA: Diagnosis not present

## 2014-12-22 DIAGNOSIS — F1010 Alcohol abuse, uncomplicated: Secondary | ICD-10-CM | POA: Insufficient documentation

## 2014-12-22 DIAGNOSIS — K219 Gastro-esophageal reflux disease without esophagitis: Secondary | ICD-10-CM | POA: Diagnosis not present

## 2014-12-22 DIAGNOSIS — Z955 Presence of coronary angioplasty implant and graft: Secondary | ICD-10-CM | POA: Diagnosis not present

## 2014-12-22 DIAGNOSIS — I10 Essential (primary) hypertension: Secondary | ICD-10-CM | POA: Insufficient documentation

## 2014-12-22 DIAGNOSIS — Z87891 Personal history of nicotine dependence: Secondary | ICD-10-CM | POA: Diagnosis not present

## 2014-12-22 DIAGNOSIS — F329 Major depressive disorder, single episode, unspecified: Secondary | ICD-10-CM | POA: Insufficient documentation

## 2014-12-22 DIAGNOSIS — I340 Nonrheumatic mitral (valve) insufficiency: Secondary | ICD-10-CM | POA: Diagnosis not present

## 2014-12-22 HISTORY — DX: Unspecified viral hepatitis C without hepatic coma: B19.20

## 2014-12-22 HISTORY — PX: ESOPHAGOGASTRODUODENOSCOPY: SHX5428

## 2014-12-22 HISTORY — DX: Nonrheumatic mitral (valve) insufficiency: I34.0

## 2014-12-22 HISTORY — DX: Hyperlipidemia, unspecified: E78.5

## 2014-12-22 HISTORY — DX: Other chronic pancreatitis: K86.1

## 2014-12-22 HISTORY — DX: Anemia, unspecified: D64.9

## 2014-12-22 HISTORY — DX: Diverticulosis of intestine, part unspecified, without perforation or abscess without bleeding: K57.90

## 2014-12-22 HISTORY — DX: Essential (primary) hypertension: I10

## 2014-12-22 HISTORY — DX: Gastro-esophageal reflux disease without esophagitis: K21.9

## 2014-12-22 SURGERY — EGD (ESOPHAGOGASTRODUODENOSCOPY)
Anesthesia: General

## 2014-12-22 MED ORDER — FENTANYL CITRATE (PF) 100 MCG/2ML IJ SOLN
INTRAMUSCULAR | Status: DC | PRN
  Administered 2014-12-22: 50 ug via INTRAVENOUS

## 2014-12-22 MED ORDER — LIDOCAINE HCL (CARDIAC) 20 MG/ML IV SOLN
INTRAVENOUS | Status: DC | PRN
  Administered 2014-12-22: 100 mg via INTRAVENOUS

## 2014-12-22 MED ORDER — PROPOFOL INFUSION 10 MG/ML OPTIME
INTRAVENOUS | Status: DC | PRN
  Administered 2014-12-22: 150 ug/kg/min via INTRAVENOUS

## 2014-12-22 MED ORDER — SODIUM CHLORIDE 0.9 % IV SOLN
INTRAVENOUS | Status: DC
  Administered 2014-12-22: 1000 mL via INTRAVENOUS
  Administered 2014-12-22: 12:00:00 via INTRAVENOUS

## 2014-12-22 MED ORDER — MIDAZOLAM HCL 5 MG/5ML IJ SOLN
INTRAMUSCULAR | Status: DC | PRN
  Administered 2014-12-22: 1 mg via INTRAVENOUS

## 2014-12-22 MED ORDER — PROPOFOL 10 MG/ML IV BOLUS
INTRAVENOUS | Status: DC | PRN
  Administered 2014-12-22: 100 mg via INTRAVENOUS

## 2014-12-22 NOTE — Op Note (Signed)
[facility] Patient Name: Christopher Orr Procedure Date: 12/22/2014 11:49 AM MRN: [id number] Account #: [id number] Date of Birth: 01-07-1946 Admit Type: Outpatient Age: 69 Room: [facility] ENDO ROOM 2 Gender: Male Note Status: Finalized Procedure:         Upper GI endoscopy Indications:       Cirrhosis rule out esophageal varices Patient Profile:   This is a 69 year old male. Providers:         Gerrit Heck. Rayann Heman, MD Referring MD:      Venetia Maxon. Elijio Miles, MD (Referring MD) Medicines:         Propofol per Anesthesia Complications:     No immediate complications. Procedure:         Pre-Anesthesia Assessment:                    - Prior to the procedure, a History and Physical was                     performed, and patient medications and allergies were                     reviewed. The patient is competent. The risks and benefits                     of the procedure and the sedation options and risks were                     discussed with the patient. All questions were answered                     and informed consent was obtained. Patient identification                     and proposed procedure were verified by the physician and                     the nurse in the pre-procedure area. Mental Status                     Examination: alert and oriented. Airway Examination:                     normal oropharyngeal airway and neck mobility. Respiratory                     Examination: clear to auscultation. CV Examination: RRR,                     no murmurs, no S3 or S4. Prophylactic Antibiotics: The                     patient does not require prophylactic antibiotics. Prior                     Anticoagulants: The patient has taken no previous                     anticoagulant or antiplatelet agents. ASA Grade                     Assessment: III - A patient with severe systemic disease.                     After reviewing the risks and  benefits, the patient  was                     deemed in satisfactory condition to undergo the procedure.                     The anesthesia plan was to use monitored anesthesia care                     (MAC). Immediately prior to administration of medications,                     the patient was re-assessed for adequacy to receive                     sedatives. The heart rate, respiratory rate, oxygen                     saturations, blood pressure, adequacy of pulmonary                     ventilation, and response to care were monitored                     throughout the procedure. The physical status of the                     patient was re-assessed after the procedure.                    - Prior to the procedure, a History and Physical was                     performed, and patient medications, allergies and                     sensitivities were reviewed. The patient's tolerance of                     previous anesthesia was reviewed.                    After obtaining informed consent, the endoscope was passed                     under direct vision. Throughout the procedure, the                     patient's blood pressure, pulse, and oxygen saturations                     were monitored continuously. The Endoscope was introduced                     through the mouth, and advanced to the second part of                     duodenum. The upper GI endoscopy was accomplished without                     difficulty. The patient tolerated the procedure well. Findings:      The Z-line was irregular and was found at the gastroesophageal junction.       Biopsies were taken with a cold forceps for histology.      Mild inflammation characterized by erythema was found in the gastric  antrum.      The examined duodenum was normal. Impression:        - Z-line irregular, at the gastroesophageal junction.                     Biopsied.                    - Gastritis.                    - Normal examined  duodenum. Recommendation:    - Observe patient in GI recovery unit.                    - Resume regular diet.                    - Continue present medications.                    - Await pathology results.                    - Repeat the upper endoscopy in 3 years for variceal                     screening purposes.                    - Return to GI clinic.                    - The findings and recommendations were discussed with the                     patient. Procedure Code(s): --- Professional ---                    [id number], Esophagogastroduodenoscopy, flexible, transoral;                     with biopsy, single or multiple CPT copyright 2014 American Medical Association. All rights reserved. The codes documented in this report are preliminary and upon coder review may  be revised to meet current compliance requirements. Mellody Life, MD 12/22/2014 12:26:35 PM This report has been signed electronically. Number of Addenda: 0 Note Initiated On: 12/22/2014 11:49 AM      [facility]

## 2014-12-22 NOTE — Anesthesia Postprocedure Evaluation (Signed)
  Anesthesia Post-op Note  Patient: Christopher Orr  Procedure(s) Performed: Procedure(s): ESOPHAGOGASTRODUODENOSCOPY (EGD) (N/A)  Anesthesia type:General  Patient location: PACU  Post pain: Pain level controlled  Post assessment: Post-op Vital signs reviewed, Patient's Cardiovascular Status Stable, Respiratory Function Stable, Patent Airway and No signs of Nausea or vomiting  Post vital signs: Reviewed and stable  Last Vitals:  Filed Vitals:   12/22/14 1300  BP: 121/76  Pulse: 73  Temp:   Resp: 16    Level of consciousness: awake, alert  and patient cooperative  Complications: No apparent anesthesia complications

## 2014-12-22 NOTE — H&P (Signed)
Primary Care Physician:  Volanda Napoleon, MD  Pre-Procedure History & Physical: HPI:  Christopher Orr is a 69 y.o. male is here for an endoscopy.   Past Medical History  Diagnosis Date  . History of lumbar fusion   . Depression   . COPD (chronic obstructive pulmonary disease)   . Drug use   . Alcohol abuse   . CAD (coronary artery disease)     a. s/p stent to LAD in 2006 at [facility], EF 60% at that time.  . Hypertension   . Hyperlipidemia   . Hepatitis C   . Anemia   . GERD (gastroesophageal reflux disease)   . Diverticulosis   . Mitral regurgitation   . Chronic pancreatitis     Past Surgical History  Procedure Laterality Date  . Lumbar fusion    . Tonsillectomy      Prior to Admission medications   Medication Sig Start Date End Date Taking? Authorizing Provider  albuterol (PROVENTIL HFA;VENTOLIN HFA) 108 (90 BASE) MCG/ACT inhaler Inhale 2 puffs into the lungs every 6 (six) hours as needed for wheezing or shortness of breath.   Yes Historical Provider, MD  ARIPiprazole (ABILIFY) 10 MG tablet Take 10 mg by mouth daily.   Yes Historical Provider, MD  aspirin 81 MG tablet Take 81 mg by mouth daily.   Yes Historical Provider, MD  chlordiazePOXIDE (LIBRIUM) 25 MG capsule Take 25 mg by mouth 2 (two) times daily.   Yes Historical Provider, MD  cloNIDine (CATAPRES) 0.1 MG tablet Take 0.1 mg by mouth at bedtime.   Yes Historical Provider, MD  clopidogrel (PLAVIX) 75 MG tablet Take 75 mg by mouth daily. 10/31/14  Yes Historical Provider, MD  CRESTOR 5 MG tablet Take 5 mg by mouth at bedtime. 06/17/14  Yes Historical Provider, MD  diazepam (VALIUM) 10 MG tablet Take 0.5 tablets (5 mg total) by mouth daily. Patient taking differently: Take 10 mg by mouth daily.  07/30/14  Yes Silver Huguenin Elgergawy, MD  FLUoxetine (PROZAC) 40 MG capsule Take 40 mg by mouth daily.   Yes Historical Provider, MD  FLUoxetine HCl 60 MG TABS Take 1 tablet by mouth daily. 10/18/14  Yes Historical Provider, MD   folic acid (FOLVITE) 1 MG tablet Take 1 tablet (1 mg total) by mouth daily. 07/30/14  Yes Dawood S Elgergawy, MD  Ledipasvir-Sofosbuvir 90-400 MG TABS Take 1 tablet by mouth every morning. For 12 weeks   Yes Historical Provider, MD  Multiple Vitamins-Minerals (MULTIVITAMIN WITH MINERALS) tablet Take 1 tablet by mouth daily.   Yes Historical Provider, MD  omeprazole (PRILOSEC) 20 MG capsule Take 1 capsule by mouth daily. 11/29/14  Yes Historical Provider, MD  oxybutynin (DITROPAN-XL) 5 MG 24 hr tablet Take 5 mg by mouth every morning. 09/10/14  Yes Historical Provider, MD  prasugrel (EFFIENT) 10 MG TABS tablet Take 10 mg by mouth daily.   Yes Historical Provider, MD  simvastatin (ZOCOR) 20 MG tablet Take 20 mg by mouth daily.   Yes Historical Provider, MD  tadalafil (CIALIS) 5 MG tablet Take 5 mg by mouth daily as needed for erectile dysfunction.   Yes Historical Provider, MD  tamsulosin (FLOMAX) 0.4 MG CAPS capsule Take 0.4 mg by mouth daily. 09/13/14  Yes Historical Provider, MD  thiamine 100 MG tablet Take 1 tablet (100 mg total) by mouth daily. 07/30/14  Yes Albertine Patricia, MD  tiotropium (SPIRIVA) 18 MCG inhalation capsule Place 18 mcg into inhaler and inhale daily.   Yes  Historical Provider, MD  traMADol (ULTRAM) 50 MG tablet Take by mouth every 8 (eight) hours.   Yes Historical Provider, MD  traZODone (DESYREL) 150 MG tablet Take 150 mg by mouth at bedtime.   Yes Historical Provider, MD  levofloxacin (LEVAQUIN) 500 MG tablet Take 1 tablet (500 mg total) by mouth daily. 07/30/14   Albertine Patricia, MD    Allergies as of 12/07/2014  . (No Known Allergies)    Family History  Problem Relation Age of Onset  . Heart disease Father     Unclear details. Father passed in late 57's 2/2 cancer.  . Cancer Father     History   Social History  . Marital Status: Divorced    Spouse Name: N/A  . Number of Children: N/A  . Years of Education: N/A   Occupational History  . Not on file.    Social History Main Topics  . Smoking status: Former Smoker -- 1.50 packs/day    Types: Cigarettes    Start date: 07/28/1962    Quit date: 07/28/2012  . Smokeless tobacco: Never Used  . Alcohol Use: 8.4 oz/week    0 Standard drinks or equivalent, 14 Cans of beer per week     Comment: 6pack and fifth of liquor each day  . Drug Use: Yes    Special: Heroin, Marijuana     Comment: Endorsed heroin 06/2014, UDS also + benzos, opiates, THC, neg for cocaine at that time.  Marland Kitchen Sexual Activity: Not on file   Other Topics Concern  . Not on file   Social History Narrative     Physical Exam: BP 114/79 mmHg  Pulse 82  Temp(Src) 98.4 F (36.9 C) (Tympanic)  Resp 18  Ht 6' (1.829 m)  Wt 264 lb (119.75 kg)  BMI 35.80 kg/m2  SpO2 93% General:   Alert,  pleasant and cooperative in NAD Head:  Normocephalic and atraumatic. Neck:  Supple; no masses or thyromegaly. Lungs:  Clear throughout to auscultation.    Heart:  Regular rate and rhythm. Abdomen:  Soft, nontender and nondistended. Normal bowel sounds, without guarding, and without rebound.   Neurologic:  Alert and  oriented x4;  grossly normal neurologically.  Impression/Plan: Christopher Orr is here for an endoscopy to be performed for variceal screening  Risks, benefits, limitations, and alternatives regarding  endoscopy have been reviewed with the patient.  Questions have been answered.  All parties agreeable.   [name], MD  12/22/2014, 11:56 AM

## 2014-12-22 NOTE — Anesthesia Preprocedure Evaluation (Signed)
Anesthesia Evaluation  Patient identified by MRN, date of birth, ID band Patient awake    Reviewed: Allergy & Precautions, H&P , NPO status , Patient's Chart, lab work & pertinent test results, reviewed documented beta blocker date and time   Airway Mallampati: II  TM Distance: >3 FB Neck ROM: full    Dental no notable dental hx.    Pulmonary neg pulmonary ROS, pneumonia -, resolved, COPDformer smoker,  breath sounds clear to auscultation  Pulmonary exam normal       Cardiovascular Exercise Tolerance: Good hypertension, + CAD negative cardio ROS  Rhythm:regular Rate:Normal     Neuro/Psych PSYCHIATRIC DISORDERS negative neurological ROS  negative psych ROS   GI/Hepatic negative GI ROS, Neg liver ROS, GERD-  ,(+) C  Endo/Other  negative endocrine ROS  Renal/GU Renal diseasenegative Renal ROS  negative genitourinary   Musculoskeletal   Abdominal   Peds  Hematology negative hematology ROS (+) anemia ,   Anesthesia Other Findings   Reproductive/Obstetrics negative OB ROS                             Anesthesia Physical Anesthesia Plan  ASA: III  Anesthesia Plan: General   Post-op Pain Management:    Induction:   Airway Management Planned:   Additional Equipment:   Intra-op Plan:   Post-operative Plan:   Informed Consent: I have reviewed the patients History and Physical, chart, labs and discussed the procedure including the risks, benefits and alternatives for the proposed anesthesia with the patient or authorized representative who has indicated his/her understanding and acceptance.   Dental Advisory Given  Plan Discussed with: CRNA  Anesthesia Plan Comments:         Anesthesia Quick Evaluation

## 2014-12-22 NOTE — Transfer of Care (Signed)
Immediate Anesthesia Transfer of Care Note  Patient: Christopher Orr  Procedure(s) Performed: Procedure(s): ESOPHAGOGASTRODUODENOSCOPY (EGD) (N/A)  Patient Location: PACU  Anesthesia Type:General  Level of Consciousness: awake  Airway & Oxygen Therapy: Patient Spontanous Breathing and Patient connected to nasal cannula oxygen  Post-op Assessment: Report given to RN and Post -op Vital signs reviewed and stable  Post vital signs: Reviewed and stable  Last Vitals:  Filed Vitals:   12/22/14 1039  BP: 114/79  Pulse: 82  Temp: 36.9 C  Resp: 18    Complications: No apparent anesthesia complications

## 2014-12-26 ENCOUNTER — Encounter: Payer: Self-pay | Admitting: Gastroenterology

## 2014-12-26 LAB — SURGICAL PATHOLOGY

## 2015-05-30 ENCOUNTER — Ambulatory Visit: Payer: Self-pay | Admitting: Urology

## 2015-05-31 ENCOUNTER — Ambulatory Visit: Payer: Self-pay | Admitting: Urology

## 2015-06-01 ENCOUNTER — Ambulatory Visit (INDEPENDENT_AMBULATORY_CARE_PROVIDER_SITE_OTHER): Payer: PPO | Admitting: Urology

## 2015-06-01 ENCOUNTER — Encounter: Payer: Self-pay | Admitting: Urology

## 2015-06-01 VITALS — BP 119/75 | HR 80 | Ht 72.0 in | Wt 265.5 lb

## 2015-06-01 DIAGNOSIS — N401 Enlarged prostate with lower urinary tract symptoms: Secondary | ICD-10-CM

## 2015-06-01 DIAGNOSIS — N138 Other obstructive and reflux uropathy: Secondary | ICD-10-CM

## 2015-06-01 LAB — BLADDER SCAN AMB NON-IMAGING

## 2015-06-01 NOTE — Addendum Note (Signed)
Addended by: Tommy Rainwater on: 06/01/2015 02:22 PM   Modules accepted: Orders

## 2015-06-01 NOTE — Progress Notes (Signed)
06/01/2015 2:02 PM   Christopher Orr [birth year] [id number]  Referring provider: Jodi Marble, MD [address] [place], [place] [address]  Chief Complaint  Patient presents with  . Benign Prostatic Hypertrophy    66month    HQPY:[id number] history of both urinary retention and incontinence. He has a lot of urge incontinence which may or may not be overflow he has known BPH but appears very obstructive. Dr. BErlene Quanworked him up in the past but he has not come back for 6 months. She wanted to come back for uroflow. He is here today for uroflow but can not void. He has only 67 mL in his bladder and he hasn't voided since this morning. I think he goes on fluid restriction so he doesn't get anything in his bladder because he has urge incontinence and wets himself. There is complicated BPH with possible overactive bladder secondary long-term outlet obstruction.     PMH: Past Medical History  Diagnosis Date  . History of lumbar fusion   . Depression   . COPD (chronic obstructive pulmonary disease) ([place]   . Drug use   . Alcohol abuse   . CAD (coronary artery disease)     [facility] at [facility].  . Hypertension   . Hyperlipidemia   . Hepatitis C   . Anemia   . GERD (gastroesophageal reflux disease)   . Diverticulosis   . Mitral regurgitation   . Chronic pancreatitis ([place]   . Urge incontinence   . ED (erectile dysfunction)   . BPH (benign prostatic hypertrophy) with urinary obstruction     Surgical History: Past Surgical History  Procedure Laterality Date  . Lumbar fusion    . Tonsillectomy    . Esophagogastroduodenoscopy N/A 12/22/2014    Procedure: ESOPHAGOGASTRODUODENOSCOPY (EGD);  Surgeon: MJosefine Class MD;  [facility];  Service: Endoscopy;  Laterality: N/A;    Home Medications:    Medication List       This list is accurate as of: 06/01/15  2:02 PM.  Always use your most recent med list.             albuterol 108 (90 BASE) MCG/ACT inhaler  Commonly known as:  PROVENTIL HFA;VENTOLIN HFA  Inhale 2 puffs into the lungs every 6 (six) hours as needed for wheezing or shortness of breath.     albuterol (2.5 MG/3ML) 0.083% nebulizer solution  Commonly known as:  PROVENTIL  USE TWICE A DAY PER NEBULIZER     ARIPiprazole 10 MG tablet  Commonly known as:  ABILIFY  Take 10 mg by mouth daily.     aspirin 81 MG tablet  Take 81 mg by mouth daily.     cloNIDine 0.1 MG tablet  Commonly known as:  CATAPRES  Take 0.1 mg by mouth at bedtime.     clopidogrel 75 MG tablet  Commonly known as:  PLAVIX  Take 75 mg by mouth daily.     CRESTOR 5 MG tablet  Generic drug:  rosuvastatin  Take 5 mg by mouth at bedtime.     diazepam 10 MG tablet  Commonly known as:  VALIUM  Take 0.5 tablets (5 mg total) by mouth daily.     FLUoxetine HCl 60 MG Tabs  Take 1 tablet by mouth daily.     multivitamin with minerals tablet  Take 1 tablet by mouth daily.     omeprazole 20 MG capsule  Commonly known as:  PRILOSEC  Take 1 capsule by mouth daily.     oxybutynin 5 MG 24 hr tablet  Commonly known as:  DITROPAN-XL  Take 5 mg by mouth every morning.     prasugrel 10 MG Tabs tablet  Commonly known as:  EFFIENT  Take 10 mg by mouth daily.     simvastatin 20 MG tablet  Commonly known as:  ZOCOR  Take 20 mg by mouth daily.     tadalafil 5 MG tablet  Commonly known as:  CIALIS  Take 5 mg by mouth daily as needed for erectile dysfunction.     tamsulosin 0.4 MG Caps capsule  Commonly known as:  FLOMAX  Take 0.4 mg by mouth daily.     thiamine 100 MG tablet  Take 1 tablet (100 mg total) by mouth daily.     tiotropium 18 MCG inhalation capsule  Commonly known as:  SPIRIVA  Place 18 mcg into inhaler and inhale daily.     traZODone 150 MG tablet  Commonly known as:  DESYREL  Take 150 mg by mouth at bedtime.        Allergies: No Known Allergies  Family History: Family History  Problem  Relation Age of Onset  . Heart disease Father     Unclear details. Father passed in late 74's 2/2 cancer.  . Colon cancer Father     Social History:  reports that he quit smoking about 2 years ago. His smoking use included Cigarettes. He started smoking about 52 years ago. He smoked 1.50 packs per day. He has never used smokeless tobacco. He reports that he drinks about 8.4 oz of alcohol per week. He reports that he uses illicit drugs (Heroin and Marijuana).  ROS: UROLOGY Frequent Urination?: Yes Hard to postpone urination?: Yes Burning/pain with urination?: No Get up at night to urinate?: Yes Leakage of urine?: Yes Urine stream starts and stops?: No Trouble starting stream?: No Do you have to strain to urinate?: No Blood in urine?: No Urinary tract infection?: No Sexually transmitted disease?: No Injury to kidneys or bladder?: No Painful intercourse?: No Weak stream?: No Erection problems?: Yes Penile pain?: No  Gastrointestinal Nausea?: No Vomiting?: No Indigestion/heartburn?: No Diarrhea?: No Constipation?: No  Constitutional Fever: No Night sweats?: No Weight loss?: No Fatigue?: Yes  Skin Skin rash/lesions?: No Itching?: No  Eyes Blurred vision?: Yes Double vision?: No  Ears/Nose/Throat Sore throat?: No Sinus problems?: Yes  Hematologic/Lymphatic Swollen glands?: No Easy bruising?: Yes  Cardiovascular Leg swelling?: No Chest pain?: No  Respiratory Cough?: No Shortness of breath?: Yes  Endocrine Excessive thirst?: No  Musculoskeletal Back pain?: Yes Joint pain?: No  Neurological Headaches?: No Dizziness?: No  Psychologic Depression?: Yes Anxiety?: No  Physical Exam: BP 119/75 mmHg  Pulse 80  Ht 6' (1.829 m)  Wt 265 lb 8 oz (120.43 kg)  BMI 36.00 kg/m2  Constitutional:  Alert and oriented, No acute distress. HEENT: [place] AT, moist mucus membranes.  Trachea midline, no masses. Cardiovascular: No clubbing, cyanosis, or  edema. Respiratory: Normal respiratory effort, no increased work of breathing. GI: Abdomen is soft, nontender, nondistended, no abdominal masses GU: No CVA tenderness.  [facility] is currently taking these histor: No rashes, bruises or suspicious lesions. Lymph: No cervical or inguinal adenopathy. Neurologic: Grossly intact, no focal deficits, moving all 4 extremities. Psychiatric: Normal mood and affect.  Laboratory Data: Lab Results  Component Value Date   WBC 9.3 07/30/2014   HGB 11.1* 07/30/2014   HCT  32.5* 07/30/2014   MCV 96.4 07/30/2014   PLT 223 07/30/2014    Lab Results  Component Value Date   CREATININE 1.10 07/30/2014    No results found for: PSA  No results found for: TESTOSTERONE  No results found for: HGBA1C  Urinalysis    Component Value Date/Time   COLORURINE YELLOW 07/27/2014 0236   APPEARANCEUR CLOUDY* 07/27/2014 0236   LABSPEC 1.015 07/27/2014 0236   PHURINE 5.0 07/27/2014 0236   GLUCOSEU NEGATIVE 07/27/2014 0236   HGBUR TRACE* 07/27/2014 0236   BILIRUBINUR NEGATIVE 07/27/2014 0236   KETONESUR NEGATIVE 07/27/2014 0236   PROTEINUR 100* 07/27/2014 0236   UROBILINOGEN 0.2 07/27/2014 0236   NITRITE NEGATIVE 07/27/2014 0236   LEUKOCYTESUR NEGATIVE 07/27/2014 0236    Pertinent Imaging: Bladder ultrasound postvoid residual of 67 patient has not voided yet filled the bladder scan shows only 67 mL in his bladder area he needs to come in with 300 mL and bladder so we can do an appropriate uroflow  Assessment & Plan:    1. BPH (benign prostatic hypertrophy) with urinary obstruction only has 67 mL in his bladder because he gets urge incontinence I know what he is doing. Patient not drinking at all during the day. He has a very large prostate spires been obstructed long-term and now is coming unstable. He basically should should have a TURP and I explained this to the patient. Always done with her to the lab KPT or straight resection as a matter. He needs his  obstruction removed and then his overactive Bladder That Is Been so Obstructed for so Long Can Be Treated for Far As Urge Incontinence - PR COMPLEX UROFLOWMETRY - BLADDER SCAN AMB NON-IMAGING   No Follow-up on file.  Collier Flowers, [facility] [address] [place], [place] [address] [phone]

## 2015-06-07 ENCOUNTER — Other Ambulatory Visit: Payer: Self-pay | Admitting: Gastroenterology

## 2015-06-08 ENCOUNTER — Other Ambulatory Visit: Payer: Self-pay | Admitting: Gastroenterology

## 2015-06-08 DIAGNOSIS — K7460 Unspecified cirrhosis of liver: Secondary | ICD-10-CM

## 2015-06-13 ENCOUNTER — Ambulatory Visit (INDEPENDENT_AMBULATORY_CARE_PROVIDER_SITE_OTHER): Payer: PPO | Admitting: Urology

## 2015-06-13 ENCOUNTER — Encounter: Payer: Self-pay | Admitting: Urology

## 2015-06-13 VITALS — BP 112/77 | HR 86 | Ht 72.0 in | Wt 265.0 lb

## 2015-06-13 DIAGNOSIS — N400 Enlarged prostate without lower urinary tract symptoms: Secondary | ICD-10-CM

## 2015-06-13 DIAGNOSIS — N528 Other male erectile dysfunction: Secondary | ICD-10-CM | POA: Diagnosis not present

## 2015-06-13 DIAGNOSIS — N3941 Urge incontinence: Secondary | ICD-10-CM | POA: Diagnosis not present

## 2015-06-13 LAB — BLADDER SCAN AMB NON-IMAGING

## 2015-06-13 MED ORDER — OXYBUTYNIN CHLORIDE ER 15 MG PO TB24: 15.0000 mg | ORAL_TABLET | Freq: Every morning | ORAL | Status: DC

## 2015-06-13 NOTE — Progress Notes (Signed)
06/13/2015 5:04 PM   Christopher Orr 23-Mar-1946 [id number]  Referring provider: Jodi Marble, MD [address] [place], [place] [address]  Chief Complaint  Patient presents with  . Benign Prostatic Hypertrophy    HPI:  69 year old male followed for BPH. He continues to complain of primarily urinary urgency and episodes of urge incontinence on a weekly basis.  He returns today to clinic to reassess his voiding symptoms as well as for uroflow/ PVR.   He has since been on Ditropan 5 mg  XL daily without much improvement in his symptoms..   He denies any urinary frequency , incomplete bladder emptying, dysuria, gross hematuria, or significant nocturia. He continues to be mostly bothered by his episodes of sudden urge followed by incontinence.  No concern for urinary retention in the past.  He underwent further workup in the office previously in May 2016 for workup his primarily irritative voiding symptoms including office cystoscopy and urine cytology which were negative for any evidence of bladder malignancy. He diid have evidence of bilobar coaptation and mild trabeculation without presence of the median lobe.   CT abdomen pelvis with and without contrast [facility] per report showed an enlarged prostate with bladder wall thickening without delay images.  He does use alcohol regularly, drinks 3 beers daily but as much as    He does also have baseline erectile dysfunction and has tried Cialis in the past.      IPSS      06/13/15 1300       International Prostate Symptom Score   How often have you had the sensation of not emptying your bladder? About half the time     How often have you had to urinate less than every two hours? About half the time     How often have you found you stopped and started again several times when you urinated? Not at All     How often have you found it difficult to postpone urination? Almost always     How often have you had a weak urinary  stream? Not at All     How often have you had to strain to start urination? Not at All     How many times did you typically get up at night to urinate? 2 Times     Total IPSS Score 13     Quality of Life due to urinary symptoms   If you were to spend the rest of your life with your urinary condition just the way it is now how would you feel about that? Mostly Disatisfied        Uroflow: Voided 338 cc Qmax 45 ml/s Qave 28.9 Voiding time 11.7 Time to peak flow 5.6 s  Nice parabolic flow   PMH: Past Medical History  Diagnosis Date  . History of lumbar fusion   . Depression   . COPD (chronic obstructive pulmonary disease) ([place])   . Drug use   . Alcohol abuse   . CAD (coronary artery disease)     a. s/p stent to LAD in 2006 at [facility], EF 60% at that time.  . Hypertension   . Hyperlipidemia   . Hepatitis C   . Anemia   . GERD (gastroesophageal reflux disease)   . Diverticulosis   . Mitral regurgitation   . Chronic pancreatitis ([place])   . Urge incontinence   . ED (erectile dysfunction)   . BPH (benign prostatic hypertrophy) with urinary obstruction  Surgical History: Past Surgical History  Procedure Laterality Date  . Lumbar fusion    . Tonsillectomy    . Esophagogastroduodenoscopy N/A 12/22/2014    Procedure: ESOPHAGOGASTRODUODENOSCOPY (EGD);  Surgeon: [name], MD;  [facility];  Service: Endoscopy;  Laterality: N/A;    Home Medications:    Medication List       This list is accurate as of: 06/13/15  5:04 PM.  Always use your most recent med list.               albuterol 108 (90 BASE) MCG/ACT inhaler  Commonly known as:  PROVENTIL HFA;VENTOLIN HFA  Inhale 2 puffs into the lungs every 6 (six) hours as needed for wheezing or shortness of breath.     albuterol (2.5 MG/3ML) 0.083% nebulizer solution  Commonly known as:  PROVENTIL  USE TWICE A DAY PER NEBULIZER     ARIPiprazole 10 MG tablet  Commonly known as:  ABILIFY  Take 10 mg  by mouth daily.     aspirin 81 MG tablet  Take 81 mg by mouth daily.     cloNIDine 0.1 MG tablet  Commonly known as:  CATAPRES  Take 0.1 mg by mouth at bedtime.     clopidogrel 75 MG tablet  Commonly known as:  PLAVIX  Take 75 mg by mouth daily.     CRESTOR 5 MG tablet  Generic drug:  rosuvastatin  Take 5 mg by mouth at bedtime.     diazepam 10 MG tablet  Commonly known as:  VALIUM  Take 0.5 tablets (5 mg total) by mouth daily.     FLUoxetine HCl 60 MG Tabs  Take 1 tablet by mouth daily.     multivitamin with minerals tablet  Take 1 tablet by mouth daily.     omeprazole 20 MG capsule  Commonly known as:  PRILOSEC  Take 1 capsule by mouth daily.     oxybutynin 15 MG 24 hr tablet  Commonly known as:  DITROPAN XL  Take 1 tablet (15 mg total) by mouth every morning.     prasugrel 10 MG Tabs tablet  Commonly known as:  EFFIENT  Take 10 mg by mouth daily.     simvastatin 20 MG tablet  Commonly known as:  ZOCOR  Take 20 mg by mouth daily.     tadalafil 5 MG tablet  Commonly known as:  CIALIS  Take 5 mg by mouth daily as needed for erectile dysfunction.     tamsulosin 0.4 MG Caps capsule  Commonly known as:  FLOMAX  Take 0.4 mg by mouth daily.     thiamine 100 MG tablet  Take 1 tablet (100 mg total) by mouth daily.     tiotropium 18 MCG inhalation capsule  Commonly known as:  SPIRIVA  Place 18 mcg into inhaler and inhale daily.     traZODone 150 MG tablet  Commonly known as:  DESYREL  Take 150 mg by mouth at bedtime.        Allergies: No Known Allergies  Family History: Family History  Problem Relation Age of Onset  . Heart disease Father     Unclear details. Father passed in late 44's 2/2 cancer.  . Colon cancer Father     Social History:  reports that he quit smoking about 2 years ago. His smoking use included Cigarettes. He started smoking about 52 years ago. He smoked 1.50 packs per day. He has never used smokeless tobacco. He reports that he  drinks about 8.4 oz of alcohol per week. He reports that he uses illicit drugs (Heroin and Marijuana).  ROS: UROLOGY Frequent Urination?: Yes Hard to postpone urination?: Yes Burning/pain with urination?: No Get up at night to urinate?: Yes Leakage of urine?: Yes Urine stream starts and stops?: No Trouble starting stream?: No Do you have to strain to urinate?: No Blood in urine?: No Urinary tract infection?: No Sexually transmitted disease?: No Injury to kidneys or bladder?: No Painful intercourse?: No Weak stream?: No Erection problems?: Yes Penile pain?: No  Gastrointestinal Nausea?: No Vomiting?: No Indigestion/heartburn?: No Diarrhea?: No Constipation?: No  Constitutional Fever: No Night sweats?: No Weight loss?: No Fatigue?: Yes  Skin Skin rash/lesions?: No Itching?: No  Eyes Blurred vision?: Yes Double vision?: No  Ears/Nose/Throat Sore throat?: No Sinus problems?: Yes  Hematologic/Lymphatic Swollen glands?: No Easy bruising?: Yes  Cardiovascular Leg swelling?: No Chest pain?: No  Respiratory Cough?: No Shortness of breath?: Yes  Endocrine Excessive thirst?: No  Musculoskeletal Back pain?: Yes Joint pain?: No  Neurological Headaches?: No Dizziness?: No  Psychologic Depression?: Yes Anxiety?: No  Physical Exam: BP 112/77 mmHg  Pulse 86  Ht 6' (1.829 m)  Wt 265 lb (120.203 kg)  BMI 35.93 kg/m2  Constitutional:  Alert and oriented, No acute distress. HEENT: [place] AT, moist mucus membranes.  Trachea midline, no masses. Cardiovascular: No clubbing, cyanosis, or edema. Respiratory: Normal respiratory effort, no increased work of breathing. Skin: No rashes, bruises or suspicious lesions. Neurologic: Grossly intact, no focal deficits, moving all 4 extremities. Psychiatric: Normal mood and affect.  Laboratory Data: Lab Results  Component Value Date   WBC 9.3 07/30/2014   HGB 11.1* 07/30/2014   HCT 32.5* 07/30/2014   MCV 96.4  07/30/2014   PLT 223 07/30/2014    Lab Results  Component Value Date   CREATININE 1.10 07/30/2014    PSA 10/07/14 --> 2.2  Urinalysis Results for orders placed or performed in visit on 06/13/15  BLADDER SCAN AMB NON-IMAGING  Result Value Ref Range   Scan Result 33m     Assessment & Plan:   69year old male with  Urinary urgency and episodes of urge incontinence. Previous workup including CT scan, cystoscopy, and cytology negative. Uroflow today shows extraordinary peak flow of 45 ML's per second which is not at all consistent with obstruction. He is also able to empty his bladder completely. I suspect his symptoms related to overactivity or bladder irritation.  1. BPH (benign prostatic hyperplasia)  As above, patient does have prostatic enlargement but no clear evidence of significant obstruction or obstructive symptoms.  DUe for PSA/ DRE in 09/2015. - BLADDER SCAN AMB NON-IMAGING - PR COMPLEX UROFLOWMETRY  2. Urge incontinence  Plan to increase anticholinergic oxybutynin from 5 milligrams XL to 15 mg XL. Patient advised to call the office after 30 days if he sees no benefit with increased dosage in order to change his medication to a different anticholinergic.  3. Other male erectile dysfunction Continue Cialis as needed   Return in about 3 months (around 09/13/2015) for PSA/ DRE, PVR, IPSS (Dr. B).  AHollice Espy MD  [facility] [address] [place]ount Clare[place] [phone]

## 2015-06-18 ENCOUNTER — Ambulatory Visit
Admission: RE | Admit: 2015-06-18 | Discharge: 2015-06-18 | Disposition: A | Discharge status: Home / Self Care | Payer: PPO | Source: Ambulatory Visit | Attending: Gastroenterology | Admitting: Gastroenterology

## 2015-06-18 DIAGNOSIS — K7460 Unspecified cirrhosis of liver: Secondary | ICD-10-CM | POA: Diagnosis not present

## 2015-08-07 DIAGNOSIS — G4489 Other headache syndrome: Secondary | ICD-10-CM | POA: Diagnosis not present

## 2015-08-07 DIAGNOSIS — I10 Essential (primary) hypertension: Secondary | ICD-10-CM | POA: Diagnosis not present

## 2015-08-07 DIAGNOSIS — J449 Chronic obstructive pulmonary disease, unspecified: Secondary | ICD-10-CM | POA: Diagnosis not present

## 2015-08-07 DIAGNOSIS — J019 Acute sinusitis, unspecified: Secondary | ICD-10-CM | POA: Diagnosis not present

## 2015-09-10 DIAGNOSIS — E785 Hyperlipidemia, unspecified: Secondary | ICD-10-CM | POA: Diagnosis not present

## 2015-09-12 DIAGNOSIS — I2510 Atherosclerotic heart disease of native coronary artery without angina pectoris: Secondary | ICD-10-CM | POA: Diagnosis not present

## 2015-09-12 DIAGNOSIS — I10 Essential (primary) hypertension: Secondary | ICD-10-CM | POA: Diagnosis not present

## 2015-09-12 DIAGNOSIS — N400 Enlarged prostate without lower urinary tract symptoms: Secondary | ICD-10-CM | POA: Diagnosis not present

## 2015-09-12 DIAGNOSIS — J449 Chronic obstructive pulmonary disease, unspecified: Secondary | ICD-10-CM | POA: Diagnosis not present

## 2015-09-12 DIAGNOSIS — B351 Tinea unguium: Secondary | ICD-10-CM | POA: Diagnosis not present

## 2015-09-12 DIAGNOSIS — N3941 Urge incontinence: Secondary | ICD-10-CM | POA: Diagnosis not present

## 2015-09-12 DIAGNOSIS — K739 Chronic hepatitis, unspecified: Secondary | ICD-10-CM | POA: Diagnosis not present

## 2015-09-12 DIAGNOSIS — M545 Low back pain: Secondary | ICD-10-CM | POA: Diagnosis not present

## 2015-09-12 DIAGNOSIS — E785 Hyperlipidemia, unspecified: Secondary | ICD-10-CM | POA: Diagnosis not present

## 2015-09-12 DIAGNOSIS — K210 Gastro-esophageal reflux disease with esophagitis: Secondary | ICD-10-CM | POA: Diagnosis not present

## 2015-09-12 DIAGNOSIS — I803 Phlebitis and thrombophlebitis of lower extremities, unspecified: Secondary | ICD-10-CM | POA: Diagnosis not present

## 2015-09-14 ENCOUNTER — Encounter: Payer: Self-pay | Admitting: Urology

## 2015-09-14 ENCOUNTER — Ambulatory Visit (INDEPENDENT_AMBULATORY_CARE_PROVIDER_SITE_OTHER): Payer: PPO | Admitting: Urology

## 2015-09-14 VITALS — BP 100/63 | HR 96 | Ht 72.0 in | Wt 260.5 lb

## 2015-09-14 DIAGNOSIS — N3941 Urge incontinence: Secondary | ICD-10-CM | POA: Diagnosis not present

## 2015-09-14 DIAGNOSIS — N400 Enlarged prostate without lower urinary tract symptoms: Secondary | ICD-10-CM

## 2015-09-14 DIAGNOSIS — N528 Other male erectile dysfunction: Secondary | ICD-10-CM

## 2015-09-14 LAB — BLADDER SCAN AMB NON-IMAGING: Scan Result: 52

## 2015-09-14 MED ORDER — MIRABEGRON ER 50 MG PO TB24: 50.0000 mg | ORAL_TABLET | Freq: Every day | ORAL | Status: DC

## 2015-09-14 NOTE — Progress Notes (Addendum)
2:30 PM  09/14/2015   Christopher Orr [birth year] [id number]  Referring provider: Jodi Marble, MD [place], [place] [address]  Chief Complaint  Patient presents with  . Benign Prostatic Hypertrophy    3 month follow up  . urge incontinence    HPI:  70 year old male followed for BPH/ urgency with urge incontinence.   Last visit, his ditropan was increased last visit to 15 mg ditropan XL which has helped some but continues to have random episodes of urge incontinence without frequency or any other significant symptoms (~once per week).  He has developed dry mouth which is quite bothersome.    He underwent further workup in the office previously in May 2016 for workup his primarily irritative voiding symptoms including office cystoscopy and urine cytology which were negative for any evidence of bladder malignancy. He did have evidence of bilobar coaptation and mild trabeculation without presence of the median lobe.   CT abdomen pelvis with and without contrast [facility] per report showed an enlarged prostate with bladder wall thickening without delay images.  Uroflow peak flow excellent.   He continues use alcohol regularly, drinks 3 beers daily.  He denies that his episodes of incontinence or urgency are related to alcohol consumption.   He does also have baseline erectile dysfunction and has tried Cialis in the past.      IPSS      09/14/15 1400       International Prostate Symptom Score   How often have you had the sensation of not emptying your bladder? Not at All     How often have you had to urinate less than every two hours? Not at All     How often have you found you stopped and started again several times when you urinated? Not at All     How often have you found it difficult to postpone urination? About half the time     How often have you had a weak urinary stream? Not at All     How often have you had to strain to start urination? Not  at All     How many times did you typically get up at night to urinate? 1 Time     Total IPSS Score 4     Quality of Life due to urinary symptoms   If you were to spend the rest of your life with your urinary condition just the way it is now how would you feel about that? Mostly Disatisfied          PMH: Past Medical History  Diagnosis Date  . History of lumbar fusion   . Depression   . COPD (chronic obstructive pulmonary disease) ([place])   . Drug use   . Alcohol abuse   . CAD (coronary artery disease)     a. s/p stent to LAD in 2006 at [facility], EF 60% at that time.  . Hypertension   . Hyperlipidemia   . Hepatitis C   . Anemia   . GERD (gastroesophageal reflux disease)   . Diverticulosis   . Mitral regurgitation   . Chronic pancreatitis ([place])   . Urge incontinence   . ED (erectile dysfunction)   . BPH (benign prostatic hypertrophy) with urinary obstruction     Surgical History: Past Surgical History  Procedure Laterality Date  . Lumbar fusion    . Tonsillectomy    . Esophagogastroduodenoscopy N/A 12/22/2014    Procedure: ESOPHAGOGASTRODUODENOSCOPY (EGD);  Surgeon: [name], MD;  Location: [facility] ENDOSCOPY;  Service: Endoscopy;  Laterality: N/A;    Home Medications:    Medication List       This list is accurate as of: 09/14/15  2:30 PM.  Always use your most recent med list.               albuterol 108 (90 Base) MCG/ACT inhaler  Commonly known as:  PROVENTIL HFA;VENTOLIN HFA  Inhale 2 puffs into the lungs every 6 (six) hours as needed for wheezing or shortness of breath.     albuterol (2.5 MG/3ML) 0.083% nebulizer solution  Commonly known as:  PROVENTIL  USE TWICE A DAY PER NEBULIZER     ARIPiprazole 10 MG tablet  Commonly known as:  ABILIFY  Take 10 mg by mouth daily.     aspirin 81 MG tablet  Take 81 mg by mouth daily.     cloNIDine 0.1 MG tablet  Commonly known as:  CATAPRES  Take 0.1 mg by mouth at bedtime.     clopidogrel 75 MG tablet    Commonly known as:  PLAVIX  Take 75 mg by mouth daily.     diazepam 10 MG tablet  Commonly known as:  VALIUM  Take 0.5 tablets (5 mg total) by mouth daily.     FLUoxetine HCl 60 MG Tabs  Take 1 tablet by mouth daily.     ketorolac 10 MG tablet  Commonly known as:  TORADOL  TAKE 1 TABLET ORALLY DAILY WITH FOOD FOR HEADACHE (NO MORE THAN 1 TABLET IN 24 HOUR PERIOD)     mirabegron ER 50 MG Tb24 tablet  Commonly known as:  MYRBETRIQ  Take 1 tablet (50 mg total) by mouth daily.     multivitamin with minerals tablet  Take 1 tablet by mouth daily.     omeprazole 20 MG capsule  Commonly known as:  PRILOSEC  Take 1 capsule by mouth daily.     pantoprazole 40 MG tablet  Commonly known as:  PROTONIX  Take 40 mg by mouth daily. Reported on 09/14/2015     prasugrel 10 MG Tabs tablet  Commonly known as:  EFFIENT  Take 10 mg by mouth daily.     rosuvastatin 10 MG tablet  Commonly known as:  CRESTOR  Take 10 mg by mouth daily.     simvastatin 20 MG tablet  Commonly known as:  ZOCOR  Take 20 mg by mouth daily.     tadalafil 5 MG tablet  Commonly known as:  CIALIS  Take 5 mg by mouth daily as needed for erectile dysfunction. Reported on 09/14/2015     tamsulosin 0.4 MG Caps capsule  Commonly known as:  FLOMAX  Take 0.4 mg by mouth daily.     thiamine 100 MG tablet  Take 1 tablet (100 mg total) by mouth daily.     tiotropium 18 MCG inhalation capsule  Commonly known as:  SPIRIVA  Place 18 mcg into inhaler and inhale daily.     traZODone 150 MG tablet  Commonly known as:  DESYREL  Take 150 mg by mouth at bedtime.        Allergies: No Known Allergies  Family History: Family History  Problem Relation Age of Onset  . Heart disease Father     Unclear details. Father passed in late 24's 2/2 cancer.  . Colon cancer Father   . Kidney disease Neg Hx   . Prostate cancer Neg Hx  Social History:  reports that he quit smoking about 3 years ago. His smoking use included  Cigarettes. He started smoking about 53 years ago. He smoked 1.50 packs per day. He has never used smokeless tobacco. He reports that he drinks about 8.4 oz of alcohol per week. He reports that he uses illicit drugs (Marijuana).  ROS: UROLOGY Frequent Urination?: No Hard to postpone urination?: Yes Burning/pain with urination?: No Get up at night to urinate?: No Leakage of urine?: No Urine stream starts and stops?: No Trouble starting stream?: No Do you have to strain to urinate?: No Blood in urine?: No Urinary tract infection?: No Sexually transmitted disease?: No Injury to kidneys or bladder?: No Painful intercourse?: No Weak stream?: No Erection problems?: Yes Penile pain?: No  Gastrointestinal Nausea?: No Vomiting?: No Indigestion/heartburn?: Yes Diarrhea?: No Constipation?: No  Constitutional Fever: No Night sweats?: No Weight loss?: No Fatigue?: No  Skin Skin rash/lesions?: No Itching?: No  Eyes Blurred vision?: No Double vision?: No  Ears/Nose/Throat Sore throat?: No Sinus problems?: No  Hematologic/Lymphatic Swollen glands?: No Easy bruising?: Yes  Cardiovascular Leg swelling?: No Chest pain?: No  Respiratory Cough?: No Shortness of breath?: Yes  Endocrine Excessive thirst?: No  Musculoskeletal Back pain?: Yes Joint pain?: No  Neurological Headaches?: No Dizziness?: No  Psychologic Depression?: No Anxiety?: No  Physical Exam: BP 100/63 mmHg  Pulse 96  Ht 6' (1.829 m)  Wt 260 lb 8 oz (118.162 kg)  BMI 35.32 kg/m2  Constitutional:  Alert and oriented, No acute distress. HEENT: [place] AT, moist mucus membranes.  Trachea midline, no masses. Cardiovascular: No clubbing, cyanosis, or edema. Respiratory: Normal respiratory effort, no increased work of breathing. Skin: No rashes, bruises or suspicious lesions. DRE: Normal 40 cc gland, firm, no nodules, nontender.  Normal sphincter tone. Neurologic: Grossly intact, no focal deficits,  moving all 4 extremities. Psychiatric: Normal mood and affect.  Laboratory Data: Lab Results  Component Value Date   WBC 9.3 07/30/2014   HGB 11.1* 07/30/2014   HCT 32.5* 07/30/2014   MCV 96.4 07/30/2014   PLT 223 07/30/2014    Lab Results  Component Value Date   CREATININE 1.10 07/30/2014    PSA 10/07/14 --> 2.2  Urinalysis Results for orders placed or performed in visit on 09/14/15  BLADDER SCAN AMB NON-IMAGING  Result Value Ref Range   Scan Result 52      Assessment & Plan:   70 year old male with  urinary urgency and episodes of urge incontinence. Previous workup including CT scan, cystoscopy, and cytology negative, normal uroflow, minimal PVR.    1. BPH (benign prostatic hyperplasia)  As above, patient does have prostatic enlargement but no clear evidence of significant obstruction or obstructive symptoms.   Continue Flomax  2. Urge incontinence Continue to have signficant symptom despite ditropan 10 mg XL Will try mybetriq 50 mg daily  3. Other male erectile dysfunction Previously using cialis, can't afford it Offered other PDE5i but not interested at this time.   4. Prostate cancer screening PSA today, results pending / DRE normal   Return in about 3 months (around 12/12/2015) for PVR, reassess symptoms.  Hollice Espy, MD  [facility] [address] [place], [place] [address] [phone]  Addendum: PSA today 3.2 which is up from last year 2.2. Plan to recheck PSA next visit. Patient made aware.

## 2015-09-15 LAB — PSA: PROSTATE SPECIFIC AG, SERUM: 3.2 ng/mL (ref 0.0–4.0)

## 2015-09-24 NOTE — Addendum Note (Signed)
Addended by: Hollice Espy on: 09/24/2015 12:33 PM   Modules accepted: Orders

## 2015-09-25 ENCOUNTER — Telehealth: Payer: Self-pay

## 2015-09-25 NOTE — Telephone Encounter (Signed)
-----   Message from Hollice Espy, MD sent at 09/24/2015 12:32 PM EST ----- Your PSA was 3.2 which is slightly higher than your previous values.  This is up from last year at 2.2  We will keep a close eye on this and likely repeat it next visit.    Hollice Espy, MD

## 2015-09-25 NOTE — Telephone Encounter (Signed)
Spoke with pt in reference to PSA. Pt voiced understanding.

## 2015-10-10 DIAGNOSIS — M4806 Spinal stenosis, lumbar region: Secondary | ICD-10-CM | POA: Diagnosis not present

## 2015-10-10 DIAGNOSIS — Z6836 Body mass index (BMI) 36.0-36.9, adult: Secondary | ICD-10-CM | POA: Diagnosis not present

## 2015-10-17 ENCOUNTER — Other Ambulatory Visit (HOSPITAL_COMMUNITY): Payer: Self-pay | Admitting: Neurosurgery

## 2015-10-17 DIAGNOSIS — M48061 Spinal stenosis, lumbar region without neurogenic claudication: Secondary | ICD-10-CM

## 2015-11-08 ENCOUNTER — Ambulatory Visit
Admission: RE | Admit: 2015-11-08 | Discharge: 2015-11-08 | Disposition: A | Discharge status: Home / Self Care | Payer: PPO | Source: Ambulatory Visit | Attending: Neurosurgery | Admitting: Neurosurgery

## 2015-11-08 DIAGNOSIS — Z9889 Other specified postprocedural states: Secondary | ICD-10-CM | POA: Insufficient documentation

## 2015-11-08 DIAGNOSIS — M48061 Spinal stenosis, lumbar region without neurogenic claudication: Secondary | ICD-10-CM

## 2015-11-08 DIAGNOSIS — M4806 Spinal stenosis, lumbar region: Secondary | ICD-10-CM | POA: Insufficient documentation

## 2015-11-08 LAB — POCT I-STAT CREATININE: Creatinine, Ser: 1.1 mg/dL (ref 0.61–1.24)

## 2015-11-08 MED ORDER — GADOBENATE DIMEGLUMINE 529 MG/ML IV SOLN
20.0000 mL | Freq: Once | INTRAVENOUS | Status: AC | PRN
  Administered 2015-11-08: 20 mL via INTRAVENOUS

## 2015-11-13 DIAGNOSIS — K7469 Other cirrhosis of liver: Secondary | ICD-10-CM | POA: Diagnosis not present

## 2015-11-15 DIAGNOSIS — M4806 Spinal stenosis, lumbar region: Secondary | ICD-10-CM | POA: Diagnosis not present

## 2015-11-16 ENCOUNTER — Other Ambulatory Visit: Payer: Self-pay | Admitting: Neurosurgery

## 2015-11-16 DIAGNOSIS — M48061 Spinal stenosis, lumbar region without neurogenic claudication: Secondary | ICD-10-CM

## 2015-11-29 ENCOUNTER — Ambulatory Visit
Admission: RE | Admit: 2015-11-29 | Discharge: 2015-11-29 | Disposition: A | Discharge status: Home / Self Care | Payer: PPO | Source: Ambulatory Visit | Attending: Neurosurgery | Admitting: Neurosurgery

## 2015-11-29 DIAGNOSIS — M48061 Spinal stenosis, lumbar region without neurogenic claudication: Secondary | ICD-10-CM

## 2015-11-29 DIAGNOSIS — M545 Low back pain: Secondary | ICD-10-CM | POA: Diagnosis not present

## 2015-11-29 MED ORDER — METHYLPREDNISOLONE ACETATE 40 MG/ML INJ SUSP (RADIOLOG
120.0000 mg | Freq: Once | INTRAMUSCULAR | Status: AC
  Administered 2015-11-29: 120 mg via EPIDURAL

## 2015-11-29 MED ORDER — IOPAMIDOL (ISOVUE-M 200) INJECTION 41%
1.0000 mL | Freq: Once | INTRAMUSCULAR | Status: AC
  Administered 2015-11-29: 1 mL via EPIDURAL

## 2015-11-29 NOTE — Discharge Instructions (Signed)

## 2015-12-06 ENCOUNTER — Other Ambulatory Visit: Payer: Self-pay | Admitting: Unknown Physician Specialty

## 2015-12-06 DIAGNOSIS — B182 Chronic viral hepatitis C: Secondary | ICD-10-CM

## 2015-12-13 ENCOUNTER — Ambulatory Visit
Admission: RE | Admit: 2015-12-13 | Discharge: 2015-12-13 | Disposition: A | Discharge status: Home / Self Care | Payer: PPO | Source: Ambulatory Visit | Attending: Unknown Physician Specialty | Admitting: Unknown Physician Specialty

## 2015-12-13 DIAGNOSIS — B182 Chronic viral hepatitis C: Secondary | ICD-10-CM

## 2015-12-14 ENCOUNTER — Ambulatory Visit
Admission: RE | Admit: 2015-12-14 | Discharge: 2015-12-14 | Disposition: A | Discharge status: Home / Self Care | Payer: PPO | Source: Ambulatory Visit | Attending: Unknown Physician Specialty | Admitting: Unknown Physician Specialty

## 2015-12-14 DIAGNOSIS — E785 Hyperlipidemia, unspecified: Secondary | ICD-10-CM | POA: Diagnosis not present

## 2015-12-14 DIAGNOSIS — B182 Chronic viral hepatitis C: Secondary | ICD-10-CM | POA: Insufficient documentation

## 2015-12-14 DIAGNOSIS — B1910 Unspecified viral hepatitis B without hepatic coma: Secondary | ICD-10-CM | POA: Diagnosis not present

## 2015-12-14 DIAGNOSIS — R932 Abnormal findings on diagnostic imaging of liver and biliary tract: Secondary | ICD-10-CM | POA: Diagnosis not present

## 2015-12-14 DIAGNOSIS — I10 Essential (primary) hypertension: Secondary | ICD-10-CM | POA: Diagnosis not present

## 2015-12-18 ENCOUNTER — Ambulatory Visit: Payer: PPO | Admitting: Urology

## 2015-12-20 ENCOUNTER — Ambulatory Visit (INDEPENDENT_AMBULATORY_CARE_PROVIDER_SITE_OTHER): Payer: PPO | Admitting: Urology

## 2015-12-20 ENCOUNTER — Encounter: Payer: Self-pay | Admitting: Urology

## 2015-12-20 VITALS — BP 130/81 | HR 76 | Ht 72.0 in | Wt 265.0 lb

## 2015-12-20 DIAGNOSIS — N528 Other male erectile dysfunction: Secondary | ICD-10-CM

## 2015-12-20 DIAGNOSIS — R972 Elevated prostate specific antigen [PSA]: Secondary | ICD-10-CM

## 2015-12-20 DIAGNOSIS — N400 Enlarged prostate without lower urinary tract symptoms: Secondary | ICD-10-CM | POA: Diagnosis not present

## 2015-12-20 DIAGNOSIS — N3941 Urge incontinence: Secondary | ICD-10-CM

## 2015-12-20 LAB — BLADDER SCAN AMB NON-IMAGING

## 2015-12-20 NOTE — Progress Notes (Signed)
8:07 PM  12/23/2015   Christopher Orr [birth year] [id number]  Referring provider: Jodi Marble, MD [place], [place] [address]  Chief Complaint  Patient presents with  . Benign Prostatic Hypertrophy    5monthw/PVR and PSA    HPI:  70year old male followed for BPH/ urgency with urge incontinence.  He has tried Ditropan 15 mg XL which was not effective with side effects of dry month.  He is currently on Mybetriq 50 mg which has significant improved his symptoms.   He was having incontinences episodes weekly and since the change in therapy, has only had 2-3 episodes in the past 3 months.  His urinary frequency has also decreased.    Overall, doing very well.  He underwent further workup in the office previously in May 2016 for workup his primarily irritative voiding symptoms including office cystoscopy and urine cytology which were negative for any evidence of bladder malignancy. He did have evidence of bilobar coaptation and mild trabeculation without presence of the median lobe.   CT abdomen pelvis with and without contrast [facility] per report showed an enlarged prostate with bladder wall thickening without delay images.  Uroflow peak flow excellent.   He continues use alcohol regularly, drinks 3 beers daily.  He denies that his episodes of incontinence or urgency are related to alcohol consumption.   He does also have baseline erectile dysfunction and has tried Cialis in the past.  Most recent PSA 3.2 on 09/24/15, recheck again today given rise from 2.2 the previous year.  DRE 3 months ago unremarkable.   PSA repeated again today.   PVR minimal.      IPSS      12/20/15 1000       International Prostate Symptom Score   How often have you had the sensation of not emptying your bladder? Not at All     How often have you had to urinate less than every two hours? Less than half the time     How often have you found you stopped and started again  several times when you urinated? Not at All     How often have you found it difficult to postpone urination? Less than half the time     How often have you had a weak urinary stream? Not at All     How often have you had to strain to start urination? Not at All     How many times did you typically get up at night to urinate? 1 Time     Total IPSS Score 5     Quality of Life due to urinary symptoms   If you were to spend the rest of your life with your urinary condition just the way it is now how would you feel about that? Mostly Disatisfied          PMH: Past Medical History  Diagnosis Date  . History of lumbar fusion   . Depression   . [place] ([place] disease) ([place]   . Drug use   . Alcohol abuse   . CAD (coronary artery disease)     [facility]% at that [facility].  . Hypertension   . Hyperlipidemia   . Hepatitis C   . Anemia   . GERD (gastroesophageal reflux disease)   . Diverticulosis   . Mitral regurgitation   . Chronic pancreatitis ([place]   . Urge incontinence   .  ED (erectile dysfunction)   . BPH (benign prostatic hypertrophy) with urinary obstruction     Surgical History: Past Surgical History  Procedure Laterality Date  . Lumbar fusion    . Tonsillectomy    . Esophagogastroduodenoscopy N/A 12/22/2014    Procedure: ESOPHAGOGASTRODUODENOSCOPY (EGD);  Surgeon: [name], MD;  Location: [facility] ENDOSCOPY;  Service: Endoscopy;  Laterality: N/A;    Home Medications:    Medication List       This list is accurate as of: 12/20/15 11:59 PM.  Always use your most recent med list.               albuterol 108 (90 Base) MCG/ACT inhaler  Commonly known as:  PROVENTIL HFA;VENTOLIN HFA  Inhale 2 puffs into the lungs every 6 (six) hours as needed for wheezing or shortness of breath.     albuterol (2.5 MG/3ML) 0.083% nebulizer solution  Commonly known as:  PROVENTIL  USE TWICE A DAY PER NEBULIZER     ARIPiprazole  10 MG tablet  Commonly known as:  ABILIFY  Take 10 mg by mouth daily.     aspirin 81 MG tablet  Take 81 mg by mouth daily.     clopidogrel 75 MG tablet  Commonly known as:  PLAVIX  Take 75 mg by mouth daily.     diazepam 10 MG tablet  Commonly known as:  VALIUM  Take 0.5 tablets (5 mg total) by mouth daily.     FLUoxetine HCl 60 MG Tabs  Take 1 tablet by mouth daily.     ketorolac 10 MG tablet  Commonly known as:  TORADOL  TAKE 1 TABLET ORALLY DAILY WITH FOOD FOR HEADACHE (NO MORE THAN 1 TABLET IN 24 HOUR PERIOD)     mirabegron ER 50 MG Tb24 tablet  Commonly known as:  MYRBETRIQ  Take 1 tablet (50 mg total) by mouth daily.     multivitamin with minerals tablet  Take 1 tablet by mouth daily.     omeprazole 20 MG capsule  Commonly known as:  PRILOSEC  Take 1 capsule by mouth daily.     pantoprazole 40 MG tablet  Commonly known as:  PROTONIX  Take 40 mg by mouth daily. Reported on 09/14/2015     prasugrel 10 MG Tabs tablet  Commonly known as:  EFFIENT  Take 10 mg by mouth daily.     rosuvastatin 10 MG tablet  Commonly known as:  CRESTOR  Take 10 mg by mouth daily.     simvastatin 20 MG tablet  Commonly known as:  ZOCOR  Take 20 mg by mouth daily.     tadalafil 5 MG tablet  Commonly known as:  CIALIS  Take 5 mg by mouth daily as needed for erectile dysfunction. Reported on 09/14/2015     tamsulosin 0.4 MG Caps capsule  Commonly known as:  FLOMAX  Take 0.4 mg by mouth daily.     thiamine 100 MG tablet  Take 1 tablet (100 mg total) by mouth daily.     tiotropium 18 MCG inhalation capsule  Commonly known as:  SPIRIVA  Place 18 mcg into inhaler and inhale daily.     traZODone 150 MG tablet  Commonly known as:  DESYREL  Take 150 mg by mouth at bedtime.        Allergies: No Known Allergies  Family History: Family History  Problem Relation Age of Onset  . Heart disease Father     Unclear details. Father passed in late  50's 2/2 cancer.  . Colon cancer  Father   . Kidney disease Neg Hx   . Prostate cancer Neg Hx     Social History:  reports that he quit smoking about 3 years ago. His smoking use included Cigarettes. He started smoking about 53 years ago. He smoked 1.50 packs per day. He has never used smokeless tobacco. He reports that he drinks about 8.4 oz of alcohol per week. He reports that he uses illicit drugs (Marijuana).  ROS: UROLOGY Frequent Urination?: No Hard to postpone urination?: Yes Burning/pain with urination?: No Get up at night to urinate?: No Leakage of urine?: No Urine stream starts and stops?: No Trouble starting stream?: No Do you have to strain to urinate?: No Blood in urine?: No Urinary tract infection?: No Sexually transmitted disease?: No Injury to kidneys or bladder?: No Painful intercourse?: No Weak stream?: No Erection problems?: Yes Penile pain?: No  Gastrointestinal Nausea?: No Vomiting?: No Indigestion/heartburn?: Yes Diarrhea?: No Constipation?: No  Constitutional Fever: No Night sweats?: No Weight loss?: No Fatigue?: Yes  Skin Skin rash/lesions?: No Itching?: No  Eyes Blurred vision?: Yes Double vision?: No  Ears/Nose/Throat Sore throat?: No Sinus problems?: Yes  Hematologic/Lymphatic Swollen glands?: No Easy bruising?: Yes  Cardiovascular Leg swelling?: No Chest pain?: No  Respiratory Cough?: No Shortness of breath?: Yes  Endocrine Excessive thirst?: No  Musculoskeletal Back pain?: Yes Joint pain?: No  Neurological Headaches?: No Dizziness?: No  Psychologic Depression?: Yes Anxiety?: No  Physical Exam: BP 130/81 mmHg  Pulse 76  Ht 6' (1.829 m)  Wt 265 lb (120.203 kg)  BMI 35.93 kg/m2  Constitutional:  Alert and oriented, No acute distress. HEENT: [place] AT, moist mucus membranes.  Trachea midline, no masses. Cardiovascular: No clubbing, cyanosis, or edema. Respiratory: Normal respiratory effort, no increased work of breathing. Rectal: Deferred  today.  Performed 3 months ago.   Skin: No rashes, bruises or suspicious lesions. Neurologic: Grossly intact, no focal deficits, moving all 4 extremities. Psychiatric: Normal mood and affect.  Laboratory Data: Lab Results  Component Value Date   WBC 9.3 07/30/2014   HGB 11.1* 07/30/2014   HCT 32.5* 07/30/2014   MCV 96.4 07/30/2014   PLT 223 07/30/2014    Lab Results  Component Value Date   CREATININE 1.10 11/08/2015    PSA 10/07/14 --> 2.2         09/14/15 --> 3.2  PVR  13 cc  .  Assessment & Plan:   71 year-old male with  urinary urgency and episodes of urge incontinence. Previous workup including CT scan, cystoscopy, and cytology negative, normal uroflow, minimal PVR.    1. BPH (benign prostatic hyperplasia)  As above, patient does have prostatic enlargement but no clear evidence of significant obstruction or obstructive symptoms.   Continue Flomax PVR today minimal  2. Urge incontinence Significant improved in Myrbetriq 50 mg Continue this medication  3. Other male erectile dysfunction Previously using cialis, can't afford it Offered other PDE5i but not interested at this time.   4. Prostate cancer screening/ rising PSA PSA repeat today given recent rise   We reviewed the implications of an elevated PSA and the uncertainty surrounding it. In general, a man's PSA increases with age and is produced by both normal and cancerous prostate tissue. Differential for elevated PSA is BPH, prostate cancer, infection, recent intercourse/ejaculation, prostate infarction, recent urethroscopic manipulation (foley placement/cystoscopy) and prostatitis. Management of an elevated PSA can include observation or prostate biopsy and wediscussed this in detail. We  discussed that indications for prostate biopsy are defined by age and race specific PSA cutoffs as well as a PSA velocity of 0.75/year.  Will call with results.  May consider biopsy if PSA continues to rise.  Return in about 1  year (around 12/19/2016) for PSA/ DRE/ IPSS/ PVR.  Hollice Espy, MD  [place] [address] [place], [place] [address] [phone]  I spent 25 min with this patient of which greater than 50% was spent in counseling and coordination of care with the patient.

## 2015-12-21 LAB — PSA: PROSTATE SPECIFIC AG, SERUM: 3.8 ng/mL (ref 0.0–4.0)

## 2015-12-24 DIAGNOSIS — K739 Chronic hepatitis, unspecified: Secondary | ICD-10-CM | POA: Diagnosis not present

## 2015-12-24 DIAGNOSIS — N3941 Urge incontinence: Secondary | ICD-10-CM | POA: Diagnosis not present

## 2015-12-24 DIAGNOSIS — K210 Gastro-esophageal reflux disease with esophagitis: Secondary | ICD-10-CM | POA: Diagnosis not present

## 2015-12-24 DIAGNOSIS — N400 Enlarged prostate without lower urinary tract symptoms: Secondary | ICD-10-CM | POA: Diagnosis not present

## 2015-12-24 DIAGNOSIS — I2510 Atherosclerotic heart disease of native coronary artery without angina pectoris: Secondary | ICD-10-CM | POA: Diagnosis not present

## 2015-12-24 DIAGNOSIS — M545 Low back pain: Secondary | ICD-10-CM | POA: Diagnosis not present

## 2015-12-24 DIAGNOSIS — B351 Tinea unguium: Secondary | ICD-10-CM | POA: Diagnosis not present

## 2015-12-24 DIAGNOSIS — I10 Essential (primary) hypertension: Secondary | ICD-10-CM | POA: Diagnosis not present

## 2015-12-24 DIAGNOSIS — I803 Phlebitis and thrombophlebitis of lower extremities, unspecified: Secondary | ICD-10-CM | POA: Diagnosis not present

## 2015-12-24 DIAGNOSIS — J449 Chronic obstructive pulmonary disease, unspecified: Secondary | ICD-10-CM | POA: Diagnosis not present

## 2015-12-24 DIAGNOSIS — E785 Hyperlipidemia, unspecified: Secondary | ICD-10-CM | POA: Diagnosis not present

## 2015-12-25 ENCOUNTER — Telehealth: Payer: Self-pay | Admitting: Urology

## 2015-12-25 NOTE — Telephone Encounter (Signed)
I have scheduled the biopsy for 12-31-15 and I have called and left a message for him to call me back to discuss the biopsy and the instructions.  Sharyn Lull

## 2015-12-25 NOTE — Telephone Encounter (Signed)
I had to move his biopsy out until 01-15-16 but you will not be here to give results. I made his follow up appt with McKenzie. Is that ok?    Sharyn Lull

## 2015-12-25 NOTE — Telephone Encounter (Signed)
Ok that's what i needed to know I will move the appt   Thanks, Sharyn Lull

## 2015-12-25 NOTE — Telephone Encounter (Signed)
-----   Message from Hollice Espy, MD sent at 12/23/2015  8:17 PM EDT ----- Called Mr. Jacqualyn Posey to discuss rising PSA, up to 3.8.  The slow steady rise of his PSA, I would recommend prostate biopsy. We reviewed the risks including risk of infection, sepsis, pain, and bleeding in detail. I reviewed the procedure in detail. My office staff will give him a call to schedule this procedure.  Hollice Espy, MD

## 2015-12-25 NOTE — Telephone Encounter (Signed)
Spoke with patient and he states Dr. Humphrey Rolls at [facility] is his cardio. Contacted Dr. Laurelyn Sickle office and left a message to call in regards to patient's cardiac clearance for stopping ASA and Plavix/SW

## 2015-12-25 NOTE — Telephone Encounter (Signed)
No I want to see him for results.  He is my patient and we have an established relationship.  He can wait until I get back as long as he is ok with that.    Hollice Espy, MD

## 2015-12-25 NOTE — Telephone Encounter (Signed)
Patient is scheduled for a biopsy on 01-15-16 and is on aspirin and plavix he will need clearance before we can do the biopsy    Sharyn Lull

## 2015-12-27 DIAGNOSIS — I10 Essential (primary) hypertension: Secondary | ICD-10-CM | POA: Diagnosis not present

## 2015-12-27 DIAGNOSIS — K210 Gastro-esophageal reflux disease with esophagitis: Secondary | ICD-10-CM | POA: Diagnosis not present

## 2015-12-27 DIAGNOSIS — I2510 Atherosclerotic heart disease of native coronary artery without angina pectoris: Secondary | ICD-10-CM | POA: Diagnosis not present

## 2015-12-31 ENCOUNTER — Other Ambulatory Visit: Payer: PPO | Admitting: Urology

## 2015-12-31 NOTE — Telephone Encounter (Signed)
Received Cardiac clearance from Dr. Laurelyn Sickle office stating it is ok for patient to stop Plavix and ASA 7 days prior to prostate biopsy.  Called patient to notify, left mess to call/SW

## 2015-12-31 NOTE — Telephone Encounter (Signed)
Patient notified it is ok to stop ASA and Plavix 7 days prior to prostate biopsy. Patient verbalized understanding/SW

## 2016-01-15 ENCOUNTER — Encounter: Payer: Self-pay | Admitting: Urology

## 2016-01-15 ENCOUNTER — Ambulatory Visit (INDEPENDENT_AMBULATORY_CARE_PROVIDER_SITE_OTHER): Payer: PPO | Admitting: Urology

## 2016-01-15 ENCOUNTER — Other Ambulatory Visit: Payer: Self-pay | Admitting: Urology

## 2016-01-15 VITALS — BP 124/83 | HR 86 | Ht 72.0 in | Wt 261.0 lb

## 2016-01-15 DIAGNOSIS — R972 Elevated prostate specific antigen [PSA]: Secondary | ICD-10-CM | POA: Diagnosis not present

## 2016-01-15 DIAGNOSIS — C61 Malignant neoplasm of prostate: Secondary | ICD-10-CM | POA: Diagnosis not present

## 2016-01-15 MED ORDER — LEVOFLOXACIN 500 MG PO TABS
500.0000 mg | ORAL_TABLET | Freq: Once | ORAL | Status: AC
  Administered 2016-01-15: 500 mg via ORAL

## 2016-01-15 MED ORDER — GENTAMICIN SULFATE 40 MG/ML IJ SOLN
80.0000 mg | Freq: Once | INTRAMUSCULAR | Status: AC
  Administered 2016-01-15: 80 mg via INTRAMUSCULAR

## 2016-01-15 NOTE — Progress Notes (Signed)
3:50 PM  01/15/2016   DIEGO ULBRICHT 1946/03/10 [id number]  Referring provider: Jodi Marble, MD [place], [place] [address]  Chief Complaint  Patient presents with  . Biopsy    prostate biopsy    HPI:  70 year old male followed for BPH/ urgency with urge incontinence and rising PSA who presents today for prostate biopsy.    Most recent PSA up to 3.8 on 12/20/15 up from 3.2 on 09/24/15, and 2.2 the previous year.  DRE 3 months ago unremarkable.    PMH: Past Medical History  Diagnosis Date  . History of lumbar fusion   . Depression   . COPD (chronic obstructive pulmonary disease) ([place])   . Drug use   . Alcohol abuse   . CAD (coronary artery disease)     a. s/p stent to LAD in 2006 at [facility], EF 60% at that time.  . Hypertension   . Hyperlipidemia   . Hepatitis C   . Anemia   . GERD (gastroesophageal reflux disease)   . Diverticulosis   . Mitral regurgitation   . Chronic pancreatitis ([place])   . Urge incontinence   . ED (erectile dysfunction)   . BPH (benign prostatic hypertrophy) with urinary obstruction     Surgical History: Past Surgical History  Procedure Laterality Date  . Lumbar fusion    . Tonsillectomy    . Esophagogastroduodenoscopy N/A 12/22/2014    Procedure: ESOPHAGOGASTRODUODENOSCOPY (EGD);  Surgeon: [name], MD;  Location: [facility] ENDOSCOPY;  Service: Endoscopy;  Laterality: N/A;    Home Medications:    Medication List       This list is accurate as of: 01/15/16  3:50 PM.  Always use your most recent med list.               albuterol 108 (90 Base) MCG/ACT inhaler  Commonly known as:  PROVENTIL HFA;VENTOLIN HFA  Inhale 2 puffs into the lungs every 6 (six) hours as needed for wheezing or shortness of breath.     albuterol (2.5 MG/3ML) 0.083% nebulizer solution  Commonly known as:  PROVENTIL  USE TWICE A DAY PER NEBULIZER     ARIPiprazole 10 MG tablet  Commonly known as:  ABILIFY  Take 10 mg by mouth daily.     aspirin 81 MG tablet  Take 81 mg by mouth daily.     clopidogrel 75 MG tablet  Commonly known as:  PLAVIX  Take 75 mg by mouth daily.     diazepam 10 MG tablet  Commonly known as:  VALIUM  Take 0.5 tablets (5 mg total) by mouth daily.     FLUoxetine HCl 60 MG Tabs  Take 1 tablet by mouth daily.     ketorolac 10 MG tablet  Commonly known as:  TORADOL  TAKE 1 TABLET ORALLY DAILY WITH FOOD FOR HEADACHE (NO MORE THAN 1 TABLET IN 24 HOUR PERIOD)     multivitamin with minerals tablet  Take 1 tablet by mouth daily.     omeprazole 20 MG capsule  Commonly known as:  PRILOSEC  Take 1 capsule by mouth daily.     pantoprazole 40 MG tablet  Commonly known as:  PROTONIX  Take 40 mg by mouth daily. Reported on 09/14/2015     prasugrel 10 MG Tabs tablet  Commonly known as:  EFFIENT  Take 10 mg by mouth daily.     rosuvastatin 10 MG tablet  Commonly known as:  CRESTOR  Take 10 mg by  mouth daily.     simvastatin 20 MG tablet  Commonly known as:  ZOCOR  Take 20 mg by mouth daily.     tadalafil 5 MG tablet  Commonly known as:  CIALIS  Take 5 mg by mouth daily as needed for erectile dysfunction. Reported on 09/14/2015     tamsulosin 0.4 MG Caps capsule  Commonly known as:  FLOMAX  Take 0.4 mg by mouth daily.     thiamine 100 MG tablet  Take 1 tablet (100 mg total) by mouth daily.     tiotropium 18 MCG inhalation capsule  Commonly known as:  SPIRIVA  Place 18 mcg into inhaler and inhale daily.     traZODone 150 MG tablet  Commonly known as:  DESYREL  Take 150 mg by mouth at bedtime.        Allergies: No Known Allergies  Family History: Family History  Problem Relation Age of Onset  . Heart disease Father     Unclear details. Father passed in late 6's 2/2 cancer.  . Colon cancer Father   . Kidney disease Neg Hx   . Prostate cancer Neg Hx     Social History:  reports that he quit smoking about 3 years ago. His smoking use included Cigarettes. He started smoking  about 53 years ago. He smoked 1.50 packs per day. He has never used smokeless tobacco. He reports that he drinks about 8.4 oz of alcohol per week. He reports that he uses illicit drugs (Marijuana).   Physical Exam: BP 124/83 mmHg  Pulse 86  Ht 6' (1.829 m)  Wt 261 lb (118.389 kg)  BMI 35.39 kg/m2  Constitutional:  Alert and oriented, No acute distress. HEENT: [place] AT, moist mucus membranes.  Trachea midline, no masses. Cardiovascular: No clubbing, cyanosis, or edema. Respiratory: Normal respiratory effort, no increased work of breathing. Skin: No rashes, bruises or suspicious lesions. Neurologic: Grossly intact, no focal deficits, moving all 4 extremities. Psychiatric: Normal mood and affect.  Laboratory Data: Lab Results  Component Value Date   WBC 9.3 07/30/2014   HGB 11.1* 07/30/2014   HCT 32.5* 07/30/2014   MCV 96.4 07/30/2014   PLT 223 07/30/2014    Lab Results  Component Value Date   CREATININE 1.10 11/08/2015    PSA 10/07/14 --> 2.2         09/14/15 --> 3.2         09/14/15 --> 3.8   Prostate Biopsy Procedure   Informed consent was obtained after discussing risks/benefits of the procedure.  A time out was performed to ensure correct patient identity.  Pre-Procedure:  - Gentamicin given prophylactically - Levaquin 500 mg administered PO -Transrectal Ultrasound performed revealing a 42 gm prostate -No significant hypoechoic or median lobe noted  Procedure: - Prostate block performed using 10 cc 1% lidocaine and biopsies taken from sextant areas, a total of 12 under ultrasound guidance.  Post-Procedure: - Patient tolerated the procedure well - He was counseled to seek immediate medical attention if experiences any severe pain, significant bleeding, or fevers - Return in one week to discuss biopsy results  .  Assessment & Plan:   70 year-old male with rising PSA s/p uncomplicated biopsy  Post procedure  f/u in 2 weeks to discuss pathology results  Hollice Espy, MD  [facility] [address], [place], [place] [address] [phone]  I spent 25 min with this patient of which greater than 50% was spent in counseling and coordination of  care with the patient.

## 2016-01-18 ENCOUNTER — Other Ambulatory Visit: Payer: Self-pay | Admitting: Urology

## 2016-01-18 LAB — PATHOLOGY REPORT

## 2016-01-22 ENCOUNTER — Ambulatory Visit: Payer: PPO

## 2016-02-01 ENCOUNTER — Encounter: Payer: Self-pay | Admitting: Urology

## 2016-02-01 ENCOUNTER — Ambulatory Visit (INDEPENDENT_AMBULATORY_CARE_PROVIDER_SITE_OTHER): Payer: PPO | Admitting: Urology

## 2016-02-01 VITALS — BP 126/83 | HR 73 | Ht 72.0 in | Wt 260.0 lb

## 2016-02-01 DIAGNOSIS — N529 Male erectile dysfunction, unspecified: Secondary | ICD-10-CM

## 2016-02-01 DIAGNOSIS — N3941 Urge incontinence: Secondary | ICD-10-CM

## 2016-02-01 DIAGNOSIS — C61 Malignant neoplasm of prostate: Secondary | ICD-10-CM | POA: Diagnosis not present

## 2016-02-01 NOTE — Progress Notes (Signed)
02/01/2016 6:13 PM   Christopher Orr 10-Mar-1946 [id number]  Referring provider: Jodi Marble, MD [place], [place] [address]  Chief Complaint  Patient presents with  . Prostate Cancer    BIOPSY RESULTS    HPI: 70 year old male with a recent rise in his PSA up to 3.8 on 12/20/15 up from 3.2 on 09/24/15, and 2.2 the previous year s/p prostate biopsy on 01/15/16 demonstrating prostate cancer, T1c, Gleason 3+3 involving 7/12 cores including all cores on left and single core at right apex up to 70% tissue.  TRUS volume 42 cc.   He returns to clinic today to discuss his options.  Baseline severe ED, unable to Achieve or maintain an erection.    He does also have baseline lower urinary tract symptoms including urinary urgency and urge incontinence. He is currently on Mybetriq 50 mg.  Left open inguinal hernia repair with mesh, age 59ish      IPSS      12/20/15 1000 02/01/16 1100     International Prostate Symptom Score   How often have you had the sensation of not emptying your bladder? Not at All Not at All    How often have you had to urinate less than every two hours? Less than half the time Not at All    How often have you found you stopped and started again several times when you urinated? Not at All Not at All    How often have you found it difficult to postpone urination? Less than half the time Less than 1 in 5 times    How often have you had a weak urinary stream? Not at All Not at All    How often have you had to strain to start urination? Not at All Not at All    How many times did you typically get up at night to urinate? 1 Time 1 Time    Total IPSS Score 5 2    Quality of Life due to urinary symptoms   If you were to spend the rest of your life with your urinary condition just the way it is now how would you feel about that? Mostly Disatisfied Mostly Disatisfied           SHIM      02/01/16 1130       SHIM: Over the last 6 months:   How do you  rate your confidence that you could get and keep an erection? Very Low     When you had erections with sexual stimulation, how often were your erections hard enough for penetration (entering your partner)? Almost Never or Never     During sexual intercourse, how often were you able to maintain your erection after you had penetrated (entered) your partner? Extremely Difficult     During sexual intercourse, how difficult was it to maintain your erection to completion of intercourse? Extremely Difficult     When you attempted sexual intercourse, how often was it satisfactory for you? Extremely Difficult     SHIM Total Score   SHIM 5          PMH: Past Medical History  Diagnosis Date  . History of lumbar fusion   . Depression   . COPD (chronic obstructive pulmonary disease) ([place])   . Drug use   . Alcohol abuse   . CAD (coronary artery disease)     a. s/p stent to LAD in 2006 at [facility], EF 60% at that time.  Marland Kitchen  Hypertension   . Hyperlipidemia   . Hepatitis C   . Anemia   . GERD (gastroesophageal reflux disease)   . Diverticulosis   . Mitral regurgitation   . Chronic pancreatitis ([place])   . Urge incontinence   . ED (erectile dysfunction)   . BPH (benign prostatic hypertrophy) with urinary obstruction     Surgical History: Past Surgical History  Procedure Laterality Date  . Lumbar fusion    . Tonsillectomy    . Esophagogastroduodenoscopy N/A 12/22/2014    Procedure: ESOPHAGOGASTRODUODENOSCOPY (EGD);  Surgeon: [name], MD;  Location: [facility] ENDOSCOPY;  Service: Endoscopy;  Laterality: N/A;    Home Medications:    Medication List       This list is accurate as of: 02/01/16  6:13 PM.  Always use your most recent med list.               albuterol 108 (90 Base) MCG/ACT inhaler  Commonly known as:  PROVENTIL HFA;VENTOLIN HFA  Inhale 2 puffs into the lungs every 6 (six) hours as needed for wheezing or shortness of breath.     albuterol (2.5 MG/3ML) 0.083% nebulizer  solution  Commonly known as:  PROVENTIL  USE TWICE A DAY PER NEBULIZER     ARIPiprazole 10 MG tablet  Commonly known as:  ABILIFY  Take 10 mg by mouth daily.     aspirin 81 MG tablet  Take 81 mg by mouth daily. Reported on 01/15/2016     clopidogrel 75 MG tablet  Commonly known as:  PLAVIX  Take 75 mg by mouth daily. Reported on 01/15/2016     diazepam 10 MG tablet  Commonly known as:  VALIUM  Take 0.5 tablets (5 mg total) by mouth daily.     FLUoxetine HCl 60 MG Tabs  Take 1 tablet by mouth daily.     ketorolac 10 MG tablet  Commonly known as:  TORADOL  TAKE 1 TABLET ORALLY DAILY WITH FOOD FOR HEADACHE (NO MORE THAN 1 TABLET IN 24 HOUR PERIOD)     multivitamin with minerals tablet  Take 1 tablet by mouth daily.     MYRBETRIQ 50 MG Tb24 tablet  Generic drug:  mirabegron ER  TAKE 1 TABLET (50 MG TOTAL) BY MOUTH DAILY.     pantoprazole 40 MG tablet  Commonly known as:  PROTONIX  Take 40 mg by mouth daily. Reported on 09/14/2015     prasugrel 10 MG Tabs tablet  Commonly known as:  EFFIENT  Take 10 mg by mouth daily.     rosuvastatin 10 MG tablet  Commonly known as:  CRESTOR  Take 10 mg by mouth daily.     simvastatin 20 MG tablet  Commonly known as:  ZOCOR  Take 20 mg by mouth daily. Reported on 01/15/2016     tadalafil 5 MG tablet  Commonly known as:  CIALIS  Take 5 mg by mouth daily as needed for erectile dysfunction. Reported on 09/14/2015     tamsulosin 0.4 MG Caps capsule  Commonly known as:  FLOMAX  Take 0.4 mg by mouth daily.     thiamine 100 MG tablet  Take 1 tablet (100 mg total) by mouth daily.     tiotropium 18 MCG inhalation capsule  Commonly known as:  SPIRIVA  Place 18 mcg into inhaler and inhale daily.     traZODone 150 MG tablet  Commonly known as:  DESYREL  Take 150 mg by mouth at bedtime.  Allergies: No Known Allergies  Family History: Family History  Problem Relation Age of Onset  . Heart disease Father     Unclear details.  Father passed in late 61's 2/2 cancer.  . Colon cancer Father   . Kidney disease Neg Hx   . Prostate cancer Neg Hx     Social History:  reports that he quit smoking about 3 years ago. His smoking use included Cigarettes. He started smoking about 53 years ago. He smoked 1.50 packs per day. He has never used smokeless tobacco. He reports that he drinks about 8.4 oz of alcohol per week. He reports that he uses illicit drugs (Marijuana).  ROS: UROLOGY Frequent Urination?: No Hard to postpone urination?: No Burning/pain with urination?: No Get up at night to urinate?: No Leakage of urine?: No Urine stream starts and stops?: No Trouble starting stream?: No Do you have to strain to urinate?: No Blood in urine?: No Urinary tract infection?: No Sexually transmitted disease?: No Injury to kidneys or bladder?: No Painful intercourse?: No Weak stream?: No Erection problems?: No Penile pain?: No  Gastrointestinal Nausea?: No Vomiting?: No Indigestion/heartburn?: No Diarrhea?: No Constipation?: No  Constitutional Fever: No Night sweats?: No Weight loss?: No Fatigue?: No  Skin Skin rash/lesions?: No Itching?: No  Eyes Blurred vision?: No Double vision?: No  Ears/Nose/Throat Sore throat?: No Sinus problems?: No  Hematologic/Lymphatic Swollen glands?: No Easy bruising?: No  Cardiovascular Leg swelling?: No Chest pain?: No  Respiratory Cough?: No Shortness of breath?: No  Endocrine Excessive thirst?: No  Musculoskeletal Back pain?: No Joint pain?: No  Neurological Headaches?: No Dizziness?: No  Psychologic Depression?: No Anxiety?: No  Physical Exam: BP 126/83 mmHg  Pulse 73  Ht 6' (1.829 m)  Wt 260 lb (117.935 kg)  BMI 35.25 kg/m2  Constitutional:  Alert and oriented, No acute distress. HEENT:  AT, moist mucus membranes.  Trachea midline, no masses. Cardiovascular: No clubbing, cyanosis, or edema. Respiratory: Normal respiratory effort, no  increased work of breathing. GI: Abdomen is soft, nontender, nondistended, no abdominal masses.  Large reducible umbilical hernia. Skin: No rashes, bruises or suspicious lesions. Neurologic: Grossly intact, no focal deficits, moving all 4 extremities. Psychiatric: Normal mood and affect.  Laboratory Data: Lab Results  Component Value Date   WBC 9.3 07/30/2014   HGB 11.1* 07/30/2014   HCT 32.5* 07/30/2014   MCV 96.4 07/30/2014   PLT 223 07/30/2014    Lab Results  Component Value Date   CREATININE 1.10 11/08/2015   PSA as above  Assessment & Plan:    1. Prostate cancer ([place]) cT1c prostate cancer, PSA 3.8, Gleason 3+3 in 7/12 cores, relatively high volume LOW RISK diease  The patient was counseled about the natural history of prostate cancer and the standard treatment options that are available for prostate cancer. It was explained to him how his age and life expectancy, clinical stage, Gleason score, and PSA affect his prognosis, the decision to proceed with additional staging studies, as well as how that information influences recommended treatment strategies. We discussed the roles for active surveillance, radiation therapy, surgical therapy, androgen deprivation, as well as ablative therapy options for the treatment of prostate cancer as appropriate to his individual cancer situation. We discussed the risks and benefits of these options with regard to their impact on cancer control and also in terms of potential adverse events, complications, and impact on quality of life particularly related to urinary, bowel, and sexual function. The patient was encouraged to ask questions throughout  the discussion today and all questions were answered to his stated satisfaction. In addition, the patient was providedwith and/or directed to appropriate resources and literature for further education about prostate cancer treatment options.  We discussed surgical therapy for prostate cancer including the  different available surgical approaches. We discussed, in detail, the risks and expectations of surgery with regard to cancer control, urinary control, and erectile dysfunction as well as expected post operative cover he processed. Additional risks of surgery including but not limalited to bleeding, infection, hernia formation, nerve damage, steel formation, bowel/rect injury, potentially necessitating colostomy, damage to the urinary tract resulting in urinary leakage, urethral stricture, and cardiopulmonary risk such as myocardial infarction, stroke, death, thromboembolism etc. were explained. The risk of open surgical conversion for robotics/laparoscopic prostatectomy is also discussed.  Although I do feel that he is a surgical candidate, given his medical comorbidities and age, as well as low risk disease, brachytherapy may also be an excellent option and I have encouraged him to follow up with radiation oncology to discuss his options further.  Additionally, active surveillance is certainly an option but given his relatively high volume of disease, I would want to follow him very closely with every 3 month PSA and likely repeat biopsy 6 months.  [facility] nomogram and stats also reviewed today.  All questions answered.    - Ambulatory referral to Radiation Oncology  2. Urge incontinence Continue Mybetriq 50 mg  3. Erectile dysfunction, unspecified erectile dysfunction type Severe, no treatment disired   Return in about 3 months (around 05/03/2016) for PSA/ DRE unless elects surgery or brachytherapy.  Hollice Espy, MD  [facility] [address] [place], [place] [address] [phone]  I spent 25 min with this patient of which greater than 50% was spent in counseling and coordination of care with the patient.

## 2016-02-18 ENCOUNTER — Emergency Department: Payer: PPO

## 2016-02-18 ENCOUNTER — Ambulatory Visit
Admission: RE | Admit: 2016-02-18 | Discharge: 2016-02-18 | Disposition: A | Discharge status: Home / Self Care | Payer: PPO | Source: Ambulatory Visit | Attending: Radiation Oncology | Admitting: Radiation Oncology

## 2016-02-18 ENCOUNTER — Encounter: Payer: Self-pay | Admitting: Radiation Oncology

## 2016-02-18 ENCOUNTER — Emergency Department
Admission: EM | Admit: 2016-02-18 | Discharge: 2016-02-18 | Disposition: A | Discharge status: Home / Self Care | Payer: PPO | Attending: Emergency Medicine | Admitting: Emergency Medicine

## 2016-02-18 VITALS — BP 115/75 | HR 100 | Temp 97.8°F | Ht 72.0 in | Wt 260.0 lb

## 2016-02-18 DIAGNOSIS — N400 Enlarged prostate without lower urinary tract symptoms: Secondary | ICD-10-CM | POA: Insufficient documentation

## 2016-02-18 DIAGNOSIS — J449 Chronic obstructive pulmonary disease, unspecified: Secondary | ICD-10-CM | POA: Insufficient documentation

## 2016-02-18 DIAGNOSIS — I340 Nonrheumatic mitral (valve) insufficiency: Secondary | ICD-10-CM | POA: Diagnosis not present

## 2016-02-18 DIAGNOSIS — Y999 Unspecified external cause status: Secondary | ICD-10-CM | POA: Insufficient documentation

## 2016-02-18 DIAGNOSIS — I2510 Atherosclerotic heart disease of native coronary artery without angina pectoris: Secondary | ICD-10-CM | POA: Insufficient documentation

## 2016-02-18 DIAGNOSIS — Z87891 Personal history of nicotine dependence: Secondary | ICD-10-CM | POA: Diagnosis not present

## 2016-02-18 DIAGNOSIS — S3992XA Unspecified injury of lower back, initial encounter: Secondary | ICD-10-CM | POA: Diagnosis not present

## 2016-02-18 DIAGNOSIS — M545 Low back pain: Secondary | ICD-10-CM | POA: Diagnosis not present

## 2016-02-18 DIAGNOSIS — W010XXA Fall on same level from slipping, tripping and stumbling without subsequent striking against object, initial encounter: Secondary | ICD-10-CM | POA: Insufficient documentation

## 2016-02-18 DIAGNOSIS — Y939 Activity, unspecified: Secondary | ICD-10-CM | POA: Diagnosis not present

## 2016-02-18 DIAGNOSIS — I10 Essential (primary) hypertension: Secondary | ICD-10-CM | POA: Insufficient documentation

## 2016-02-18 DIAGNOSIS — F329 Major depressive disorder, single episode, unspecified: Secondary | ICD-10-CM | POA: Insufficient documentation

## 2016-02-18 DIAGNOSIS — Z7982 Long term (current) use of aspirin: Secondary | ICD-10-CM | POA: Diagnosis not present

## 2016-02-18 DIAGNOSIS — N529 Male erectile dysfunction, unspecified: Secondary | ICD-10-CM | POA: Diagnosis not present

## 2016-02-18 DIAGNOSIS — B1920 Unspecified viral hepatitis C without hepatic coma: Secondary | ICD-10-CM | POA: Insufficient documentation

## 2016-02-18 DIAGNOSIS — S39012A Strain of muscle, fascia and tendon of lower back, initial encounter: Secondary | ICD-10-CM | POA: Diagnosis not present

## 2016-02-18 DIAGNOSIS — Z7951 Long term (current) use of inhaled steroids: Secondary | ICD-10-CM | POA: Diagnosis not present

## 2016-02-18 DIAGNOSIS — Z955 Presence of coronary angioplasty implant and graft: Secondary | ICD-10-CM | POA: Diagnosis not present

## 2016-02-18 DIAGNOSIS — Y929 Unspecified place or not applicable: Secondary | ICD-10-CM | POA: Insufficient documentation

## 2016-02-18 DIAGNOSIS — K219 Gastro-esophageal reflux disease without esophagitis: Secondary | ICD-10-CM | POA: Diagnosis not present

## 2016-02-18 DIAGNOSIS — E785 Hyperlipidemia, unspecified: Secondary | ICD-10-CM | POA: Insufficient documentation

## 2016-02-18 DIAGNOSIS — C61 Malignant neoplasm of prostate: Secondary | ICD-10-CM | POA: Diagnosis not present

## 2016-02-18 MED ORDER — DIAZEPAM 5 MG PO TABS: 5.0000 mg | ORAL_TABLET | Freq: Three times a day (TID) | ORAL | 0 refills | Status: DC | PRN

## 2016-02-18 MED ORDER — OXYCODONE-ACETAMINOPHEN 5-325 MG PO TABS: 1.0000 | ORAL_TABLET | ORAL | 0 refills | Status: DC | PRN

## 2016-02-18 MED ORDER — KETOROLAC TROMETHAMINE 30 MG/ML IJ SOLN
60.0000 mg | Freq: Once | INTRAMUSCULAR | Status: AC
  Administered 2016-02-18: 60 mg via INTRAMUSCULAR
  Filled 2016-02-18: qty 2

## 2016-02-18 NOTE — ED Notes (Addendum)
Pt. Received water per request with MD approval. Pt. Transported to XR. Medication will be administered upon return to tx. Room.

## 2016-02-18 NOTE — Progress Notes (Signed)
Except an outstanding is perfect of Radiation Oncology NEW PATIENT EVALUATION  Name: Christopher Orr  MRN: [id number]  Date:   02/18/2016     DOB: 1945/10/01   This 70 y.o. male patient presents to the clinic for initial evaluation of stage I (T1 CN 0 M0) adenocarcinoma the prostate Gleason 6 (3+3) presenting with a PSA of 3.8.  REFERRING PHYSICIAN: Jodi Marble, MD  CHIEF COMPLAINT:  Chief Complaint  Patient presents with  . Prostate Cancer    DIAGNOSIS: The encounter diagnosis was Malignant neoplasm of prostate ([place]).   PREVIOUS INVESTIGATIONS:  Pathology report reviewed Clinical notes reviewed  HPI: Patient is a 70 year old male who has been having a rising PSA over the past year 2. to approximate year ago up to 3.8 on 12/20/2015. This prompted transrectal ultrasound-guided biopsy showing 7 of 12 cores positive for Gleason 6 (3+3) adenocarcinoma in a 42 cc prostate. Most of disease was from the left lateral lobe. Patient has history of severe erectile dysfunction and some frequency and urgency of urination and occasional urge incontinence. He is currently on mybetriq. He's been seen by urology and is being considered for I-125 interstitial implant. He is otherwise doing well. Patient does have a history of cardiac stents and is currently on blood thinner medication.  PLANNED TREATMENT REGIMEN: I-125 interstitial implant  PAST MEDICAL HISTORY:  has a past medical history of Alcohol abuse; Anemia; BPH (benign prostatic hypertrophy) with urinary obstruction; CAD (coronary artery disease); Chronic pancreatitis ([place]); COPD (chronic obstructive pulmonary disease) ([place]); Depression; Diverticulosis; Drug use; ED (erectile dysfunction); GERD (gastroesophageal reflux disease); Hepatitis C; History of lumbar fusion; Hyperlipidemia; Hypertension; Mitral regurgitation; and Urge incontinence.    PAST SURGICAL HISTORY:  Past Surgical History:  Procedure Laterality Date  .  ESOPHAGOGASTRODUODENOSCOPY N/A 12/22/2014   Procedure: ESOPHAGOGASTRODUODENOSCOPY (EGD);  Surgeon: [name], MD;  Location: [facility] ENDOSCOPY;  Service: Endoscopy;  Laterality: N/A;  . LUMBAR FUSION    . TONSILLECTOMY      FAMILY HISTORY: family history includes Colon cancer in his father; Heart disease in his father.  SOCIAL HISTORY:  reports that he quit smoking about 3 years ago. His smoking use included Cigarettes. He started smoking about 53 years ago. He smoked 1.50 packs per day. He has never used smokeless tobacco. He reports that he drinks about 8.4 oz of alcohol per week . He reports that he uses drugs, including Marijuana.  ALLERGIES: Review of patient's allergies indicates no known allergies.  MEDICATIONS:  Current Outpatient Prescriptions  Medication Sig Dispense Refill  . albuterol (PROVENTIL HFA;VENTOLIN HFA) 108 (90 BASE) MCG/ACT inhaler Inhale 2 puffs into the lungs every 6 (six) hours as needed for wheezing or shortness of breath.    Marland Kitchen albuterol (PROVENTIL) (2.5 MG/3ML) 0.083% nebulizer solution USE TWICE A DAY PER NEBULIZER  0  . ARIPiprazole (ABILIFY) 10 MG tablet Take 10 mg by mouth daily.    Marland Kitchen aspirin 81 MG tablet Take 81 mg by mouth daily. Reported on 01/15/2016    . clopidogrel (PLAVIX) 75 MG tablet Take 75 mg by mouth daily. Reported on 01/15/2016  5  . diazepam (VALIUM) 10 MG tablet Take 0.5 tablets (5 mg total) by mouth daily. (Patient taking differently: Take 10 mg by mouth daily. ) 15 tablet 5  . FLUoxetine HCl 60 MG TABS Take 1 tablet by mouth daily.  1  . ketorolac (TORADOL) 10 MG tablet TAKE 1 TABLET ORALLY DAILY WITH FOOD FOR HEADACHE (NO MORE THAN 1 TABLET IN  24 HOUR PERIOD)  0  . Multiple Vitamins-Minerals (MULTIVITAMIN WITH MINERALS) tablet Take 1 tablet by mouth daily.    Marland Kitchen MYRBETRIQ 50 MG TB24 tablet TAKE 1 TABLET (50 MG TOTAL) BY MOUTH DAILY.  11  . pantoprazole (PROTONIX) 40 MG tablet Take 40 mg by mouth daily. Reported on 09/14/2015  0  .  prasugrel (EFFIENT) 10 MG TABS tablet Take 10 mg by mouth daily.    . rosuvastatin (CRESTOR) 10 MG tablet Take 10 mg by mouth daily.  1  . simvastatin (ZOCOR) 20 MG tablet Take 20 mg by mouth daily. Reported on 01/15/2016    . tadalafil (CIALIS) 5 MG tablet Take 5 mg by mouth daily as needed for erectile dysfunction. Reported on 09/14/2015    . tamsulosin (FLOMAX) 0.4 MG CAPS capsule Take 0.4 mg by mouth daily.  0  . thiamine 100 MG tablet Take 1 tablet (100 mg total) by mouth daily. 30 tablet 0  . tiotropium (SPIRIVA) 18 MCG inhalation capsule Place 18 mcg into inhaler and inhale daily.    . traZODone (DESYREL) 150 MG tablet Take 150 mg by mouth at bedtime.     No current facility-administered medications for this encounter.     ECOG PERFORMANCE STATUS:  1 - Symptomatic but completely ambulatory  REVIEW OF SYSTEMS:  Patient denies any weight loss, fatigue, weakness, fever, chills or night sweats. Patient denies any loss of vision, blurred vision. Patient denies any ringing  of the ears or hearing loss. No irregular heartbeat. Patient denies heart murmur or history of fainting. Patient denies any chest pain or pain radiating to her upper extremities. Patient denies any shortness of breath, difficulty breathing at night, cough or hemoptysis. Patient denies any swelling in the lower legs. Patient denies any nausea vomiting, vomiting of blood, or coffee ground material in the vomitus. Patient denies any stomach pain. Patient states has had normal bowel movements no significant constipation or diarrhea. Patient denies any dysuria, hematuria or significant nocturia. Patient denies any problems walking, swelling in the joints or loss of balance. Patient denies any skin changes, loss of hair or loss of weight. Patient denies any excessive worrying or anxiety or significant depression. Patient denies any problems with insomnia. Patient denies excessive thirst, polyuria, polydipsia. Patient denies any swollen  glands, patient denies easy bruising or easy bleeding. Patient denies any recent infections, allergies or URI. Patient "s visual fields have not changed significantly in recent time.    PHYSICAL EXAM: BP 115/75 (BP Location: Left Arm, Patient Position: Sitting)   Pulse 100   Temp 97.8 F (36.6 C)   Ht 6' (1.829 m)   Wt 260 lb 0.5 oz (117.9 kg)   BMI 35.27 kg/m  On rectal exam rectal sphincter tone is good. Prostate is smooth contracted without evidence of nodularity or mass. Sulcus is preserved bilaterally. No discrete nodularity is identified. No other rectal abnormalities are noted. Well-developed well-nourished patient in NAD. HEENT reveals PERLA, EOMI, discs not visualized.  Oral cavity is clear. No oral mucosal lesions are identified. Neck is clear without evidence of cervical or supraclavicular adenopathy. Lungs are clear to A&P. Cardiac examination is essentially unremarkable with regular rate and rhythm without murmur rub or thrill. Abdomen is benign with no organomegaly or masses noted. Motor sensory and DTR levels are equal and symmetric in the upper and lower extremities. Cranial nerves II through XII are grossly intact. Proprioception is intact. No peripheral adenopathy or edema is identified. No motor or sensory levels are  noted. Crude visual fields are within normal range.  LABORATORY DATA: Pathology reports reviewed    RADIOLOGY RESULTS: No current radiologic films to review   IMPRESSION: Stage I Gleason 6 adenocarcinoma the prostate in 70 year old male  PLAN: At this time I got over treatment options with the patient including external beam radiation as well as I-125 interstitial implant. Based on his cardiac stents I'm setting him up for his cardiologist at be cleared for anesthesia as well as to be cleared for holding off of his aspirin therapy several weeks prior to his implant. Risks and benefits of treatment including possible increase in diarrhea lower urinary tract  symptoms fatigue and risks of general is anesthesia were all reviewed in detail with the patient. He seems to comprehend my treatment plan well. I've set him up for short follow-up visit after his cardiology appointment. We will then coordinate his volume study.  I would like to take this opportunity to thank you for allowing me to participate in the care of your patient.Armstead Peaks., MD

## 2016-02-18 NOTE — Discharge Instructions (Signed)
You may take Tylenol or Motrin for mild to moderate pain. Percocet is for severe pain; do not drive within 8 hours of taking Percocet. Valium is for severe muscle spasm; do not drive within 8 hours of taking Valium.  You may apply ice or heat to her low back for 10 minutes every 2 hours to improve pain.  Return to the emergency department if you develop severe pain, fever, changes in bladder or bowel habits, difficulty walking, numbness tingling or weakness, or any other symptoms concerning to you.

## 2016-02-18 NOTE — ED Triage Notes (Addendum)
Patient presents to the ED with back pain post fall yesterday.  Patient states he hit his head when he fell.  Patient takes plavix daily.  Patient is alert and oriented x 3.  Patient denies losing consciousness when he fell.  States a piece of furniture fell on top of him.  Patient is complaining of a headache.

## 2016-02-18 NOTE — ED Provider Notes (Signed)
[facility] Emergency Department Provider Note  ____________________________________________  Time seen: Approximately 5:09 PM  I have reviewed the triage vital signs and the nursing notes.   HISTORY  Chief Complaint Back Pain and Fall    HPI Christopher Orr is a 70 y.o. male with a history of obesity and prior lumbar spine fusion remotely presenting with low back pain after a fall. The patient reports that he was moving furniture yesterday while it was raining, and he slipped on the door frame, and immediately had discomfort in his low back. He did have a piece of furniture that fell onto his left upper arm and has some bruising there but no significant pain. In addition he did strike his forehead but did not have any loss of consciousness, nor did he have any immediate headache, nausea or vomiting, visual changes, numbness tingling or weakness. The patient has tried multiple medications including aspirin, BC powder, and Advil without any improvement in his pain. He denies any difficulty walking, saddle anesthesia, urinary or fecal incontinence or retention.   Past Medical History:  Diagnosis Date  . Alcohol abuse   . Anemia   . BPH (benign prostatic hypertrophy) with urinary obstruction   . CAD (coronary artery disease)    a. s/p stent to LAD in 2006 at [facility], EF 60% at that time.  . Chronic pancreatitis ([place])   . COPD (chronic obstructive pulmonary disease) ([place])   . Depression   . Diverticulosis   . Drug use   . ED (erectile dysfunction)   . GERD (gastroesophageal reflux disease)   . Hepatitis C   . History of lumbar fusion   . Hyperlipidemia   . Hypertension   . Mitral regurgitation   . Urge incontinence     Patient Active Problem List   Diagnosis Date Noted  . Chronic viral hepatitis C ([place]) 09/24/2014  . Chest pain 07/28/2014  . COPD (chronic obstructive pulmonary disease) ([place]) 07/28/2014  . Anxiety 07/28/2014  . Lactic acidosis 07/28/2014   . Aspiration pneumonia ([place]) 07/28/2014  . Elevated troponin I level 07/28/2014  . Acute kidney injury ([place]) 07/28/2014  . GERD (gastroesophageal reflux disease) 07/28/2014  . Heroin overdose 07/27/2014  . Unresponsive     Past Surgical History:  Procedure Laterality Date  . ESOPHAGOGASTRODUODENOSCOPY N/A 12/22/2014   Procedure: ESOPHAGOGASTRODUODENOSCOPY (EGD);  Surgeon: [name], MD;  Location: [facility] ENDOSCOPY;  Service: Endoscopy;  Laterality: N/A;  . LUMBAR FUSION    . TONSILLECTOMY      Current Outpatient Rx  . Order #: [id number] Class: Historical Med  . Order #: [id number] Class: Historical Med  . Order #: [id number] Class: Historical Med  . Order #: [id number] Class: Historical Med  . Order #: [id number] Class: Historical Med  . Order #: [id number] Class: Print  . Order #: [id number] Class: Historical Med  . Order #: [id number] Class: Historical Med  . Order #: [id number] Class: Historical Med  . Order #: [id number] Class: Historical Med  . Order #: [id number] Class: Print  . Order #: [id number] Class: Historical Med  . Order #: [id number] Class: Historical Med  . Order #: [id number] Class: Historical Med  . Order #: [id number] Class: Historical Med  . Order #: [id number] Class: Historical Med  . Order #: [id number] Class: Historical Med  . Order #: [id number] Class: Print  . Order #: [id number] Class: Historical Med  . Order #: [id number] Class: Historical Med    Allergies Review of patient's allergies indicates no known allergies.  Family History  Problem Relation Age of Onset  . Heart disease Father  Unclear details. Father passed in late 55's 2/2 cancer.  . Colon cancer Father   . Kidney disease Neg Hx   . Prostate cancer Neg Hx     Social History Social History  Substance Use Topics  . Smoking status: Former Smoker    Packs/day: 1.50    Types: Cigarettes    Start date: 07/28/1962    Quit date: 07/28/2012  . Smokeless tobacco: Never Used  . Alcohol use 8.4 oz/week    14  Cans of beer per week     Comment: 6pack and fifth of liquor each day    Review of Systems Constitutional: No fever/chills.No lightheadedness or syncope. Eyes: No visual changes. No blurred or double vision. ENT: No sore throat. No congestion or rhinorrhea. Cardiovascular: Denies chest pain. Denies palpitations. Respiratory: Denies shortness of breath.  No cough. Gastrointestinal: No abdominal pain.  No nausea, no vomiting.  No diarrhea.  No constipation. Genitourinary: Negative for dysuria. Musculoskeletal: Positive for back pain. No neck pain. Skin: Negative for rash. Neurological: Negative for headaches. No focal numbness, tingling or weakness. No difficulty walking. No changes in vision or speech. No changes in mental status.  10-point ROS otherwise negative.  ____________________________________________   PHYSICAL EXAM:  VITAL SIGNS: ED Triage Vitals  Enc Vitals Group     BP 02/18/16 1350 116/77     Pulse Rate 02/18/16 1350 99     Resp 02/18/16 1537 18     Temp 02/18/16 1350 97.7 F (36.5 C)     Temp Source 02/18/16 1350 Oral     SpO2 02/18/16 1350 92 %     Weight 02/18/16 1350 260 lb (117.9 kg)     Height 02/18/16 1350 6' (1.829 m)     Head Circumference --      Peak Flow --      Pain Score 02/18/16 1400 9     Pain Loc --      Pain Edu? --      Excl. in [place]? --     Constitutional: Alert and oriented. Well appearing and in no acute distress. Answers questions appropriately.GCS is 15. Eyes: Conjunctivae are normal.  EOMI. No scleral icterus. No raccoon eyes. Head: Atraumatic. No Battle sign. Nose: No congestion/rhinnorhea. No swelling over the nose, or septal hematoma. Mouth/Throat: Mucous membranes are moist. No dental injury or malocclusion. Neck: No stridor.  Supple.  No midline C-spine tenderness to palpation, step-offs or deformities. Cardiovascular: Normal rate Respiratory: Normal respiratory effort.  No accessory muscle use or retractions.   Gastrointestinal: Soft, nontender and nondistended.  No guarding or rebound.  No peritoneal signs. Musculoskeletal: No LE edema. No ttp in the calves or palpable cords.  Negative Homan's sign. Pelvis is stable. Full range of motion of the bilateral shoulders without pain. No thoracic spine tenderness, step-offs or deformities. The patient does have some diffuse mid to lower lumbar and upper sacral tenderness to palpation both in the midline and bilaterally in the paraspinous muscle distribution. There are no skin changes including ecchymosis or abrasions. Neurologic:  A&Ox3.  Speech is clear.  Face and smile are symmetric.  EOMI. PERRLA. 5 out of 5 bilateral quadriceps and hamstring strength. Normal sensation to light touch in the bilateral lower extremities. Normal gait without ataxia.  Skin:  Skin is warm, dry and intact. No rash noted. Multiple ecchymosis throughout the upper extremity's bilaterally that are variable in age. Psychiatric: Mood and affect are normal. Speech and behavior are normal.  Normal judgement.  ____________________________________________  LABS (all labs ordered are listed, but only abnormal results are displayed)  Labs Reviewed - No data to display ____________________________________________  EKG  Not indicated ____________________________________________  RADIOLOGY  Dg Lumbar Spine Complete  Result Date: 02/18/2016 CLINICAL DATA:  Slip and fall yesterday with low back pain, initial encounter EXAM: LUMBAR SPINE - COMPLETE 4+ VIEW COMPARISON:  11/08/2015 FINDINGS: Five lumbar type vertebral bodies are well visualized. Postsurgical changes are noted at L4-5. No hardware failure is noted. Mild osteophytic changes are noted throughout the lumbar spine. No definitive compression deformity is seen. No gross soft tissue abnormality is noted. IMPRESSION: Chronic changes without definitive acute abnormality. If clinical pain persists MRI may be helpful for further  evaluation. Electronically Signed   By: Inez Catalina M.D.   On: 02/18/2016 17:36   ____________________________________________   PROCEDURES  Procedure(s) performed: None  Procedures  Critical Care performed: No ____________________________________________   INITIAL IMPRESSION / ASSESSMENT AND PLAN / ED COURSE  Pertinent labs & imaging results that were available during my care of the patient were reviewed by me and considered in my medical decision making (see chart for details).  70 y.o. male with a history of a previous lumbar spine fusion remotely, presenting with low back pain without any abnormal neurologic findings on examination. The patient's clinical history as well as his physical examination are not suggestive of any spinal cord compression or cauda equina syndrome. It appears that he has muscle strain, without sciatica. The patient is on Plavix, and did strike his head although he did not have any loss of consciousness, headache, nausea or vomiting. I have offered him CT scan to evaluate for intracranial injury and the patient has deferred this at this time. He does understand return precautions should he develop any worsening symptoms. We'll plan to treat the patient symptomatically, get lumbar x-rays to rule out any hardware displacement or fractures, and discharge the patient home if his workup here is negative. He understands return precautions as well as follow-up instructions.  ____________________________________________  FINAL CLINICAL IMPRESSION(S) / ED DIAGNOSES  Final diagnoses:  Low back strain, initial encounter    Clinical Course      NEW MEDICATIONS STARTED DURING THIS VISIT:  Discharge Medication List as of 02/18/2016  5:49 PM    START taking these medications   Details  oxyCODONE-acetaminophen (ROXICET) 5-325 MG tablet Take 1-2 tablets by mouth every 4 (four) hours as needed for severe pain., Starting Mon 02/18/2016, Until Tue 02/17/2017, Print           Eula Listen, MD 02/19/16 [id number]

## 2016-02-18 NOTE — ED Notes (Addendum)
Pt. Verbalizes understanding of d/c instructions, prescriptions, and follow-up. VS stable.  Pt. In NAD at time of d/c and denies concerns. Pt. Ambulatory out of the unit with steady gait, body less tense than prior to medication indicating decrease in pain as pt. Confirmed.

## 2016-02-26 DIAGNOSIS — R0602 Shortness of breath: Secondary | ICD-10-CM | POA: Diagnosis not present

## 2016-02-26 DIAGNOSIS — I2510 Atherosclerotic heart disease of native coronary artery without angina pectoris: Secondary | ICD-10-CM | POA: Diagnosis not present

## 2016-02-26 DIAGNOSIS — K210 Gastro-esophageal reflux disease with esophagitis: Secondary | ICD-10-CM | POA: Diagnosis not present

## 2016-02-26 DIAGNOSIS — I10 Essential (primary) hypertension: Secondary | ICD-10-CM | POA: Diagnosis not present

## 2016-02-26 DIAGNOSIS — Z87891 Personal history of nicotine dependence: Secondary | ICD-10-CM | POA: Diagnosis not present

## 2016-03-05 ENCOUNTER — Ambulatory Visit
Admission: RE | Admit: 2016-03-05 | Discharge: 2016-03-05 | Disposition: A | Discharge status: Home / Self Care | Payer: PPO | Source: Ambulatory Visit | Attending: Radiation Oncology | Admitting: Radiation Oncology

## 2016-03-05 ENCOUNTER — Encounter: Payer: Self-pay | Admitting: Radiation Oncology

## 2016-03-05 VITALS — BP 116/77 | HR 76 | Temp 96.0°F | Wt 262.8 lb

## 2016-03-05 DIAGNOSIS — C61 Malignant neoplasm of prostate: Secondary | ICD-10-CM | POA: Insufficient documentation

## 2016-03-05 DIAGNOSIS — N3941 Urge incontinence: Secondary | ICD-10-CM | POA: Insufficient documentation

## 2016-03-05 DIAGNOSIS — N529 Male erectile dysfunction, unspecified: Secondary | ICD-10-CM | POA: Insufficient documentation

## 2016-03-05 DIAGNOSIS — Z510 Encounter for antineoplastic radiation therapy: Secondary | ICD-10-CM | POA: Insufficient documentation

## 2016-03-05 NOTE — Progress Notes (Signed)
Radiation Oncology Follow up Note  Name: Christopher Orr   Date:   03/05/2016 MRN:  [id number] DOB: 05-Nov-1945    This 70 y.o. male presents to the clinic today for discussion of I-125 interstitial implant for stage I adenocarcinoma the prostate.  REFERRING PROVIDER: Jodi Marble, MD  HPI: Patient is a 70 year old male with known stage I adenocarcinoma the prostate (T1 CN 0 M0) Gleason score of 6 (3+3) presenting the PSA of 3.8. He has a history of cardiac stents and currently on blood thinner medication. We evaluated him and recommended I-125 interstitial implant. He's been seen by Dr. Yancey Flemings and has had clearance to stop his Plavix prior to I-125 interstitial implant and should be fine to go ahead with the procedure. He is seen today and doing well. He again has erectile dysfunction and mild frequency and urgency of urination and urge incontinence occasionally..  COMPLICATIONS OF TREATMENT: none  FOLLOW UP COMPLIANCE: keeps appointments   PHYSICAL EXAM:  BP 116/77   Pulse 76   Temp (!) 96 F (35.6 C)   Wt 262 lb 12.6 oz (119.2 kg)   BMI 35.64 kg/m  Well-developed well-nourished patient in NAD. HEENT reveals PERLA, EOMI, discs not visualized.  Oral cavity is clear. No oral mucosal lesions are identified. Neck is clear without evidence of cervical or supraclavicular adenopathy. Lungs are clear to A&P. Cardiac examination is essentially unremarkable with regular rate and rhythm without murmur rub or thrill. Abdomen is benign with no organomegaly or masses noted. Motor sensory and DTR levels are equal and symmetric in the upper and lower extremities. Cranial nerves II through XII are grossly intact. Proprioception is intact. No peripheral adenopathy or edema is identified. No motor or sensory levels are noted. Crude visual fields are within normal range.  RADIOLOGY RESULTS: No current films for review  PLAN: At the present time will go ahead and plan for his volume study to determine  number of seeds and placement for I-125 interstitial implant. Risks and benefits of procedure were reviewed with the patient including radiation safety precautions. We'll also schedule his I-125 interstitial implant one-week coordinate our schedules with Dr. Erlene Quan. All questions been answered patient knows to call with any concerns.  I would like to take this opportunity to thank you for allowing me to participate in the care of your patient.Armstead Peaks., MD

## 2016-03-06 ENCOUNTER — Other Ambulatory Visit: Payer: Self-pay | Admitting: *Deleted

## 2016-03-14 ENCOUNTER — Telehealth: Payer: Self-pay | Admitting: Radiology

## 2016-03-14 NOTE — Telephone Encounter (Signed)
Notified pt of prostate volume study scheduled with Dr Erlene Quan on 03/26/16 with arrival time to [facility] of 7:10 & pt must be npo after mn. Notified pt of Brachytherapy scheduled with Dr Erlene Quan on 04/22/16, pre-admit testing appt on 04/08/16 '@9'$ :00 & to call day prior to procedure for arrival time to [facility]. Pt voices understanding.

## 2016-03-18 ENCOUNTER — Other Ambulatory Visit: Payer: Self-pay | Admitting: Radiology

## 2016-03-18 DIAGNOSIS — C61 Malignant neoplasm of prostate: Secondary | ICD-10-CM

## 2016-03-18 NOTE — Telephone Encounter (Signed)
Advised pt to hold ASA '81mg'$  & Plavix 7 days prior to brachytherapy ok per Dr Neoma Laming. Pt voices understanding.

## 2016-03-26 ENCOUNTER — Encounter: Payer: Self-pay | Admitting: Certified Registered"

## 2016-03-26 ENCOUNTER — Ambulatory Visit
Admission: RE | Admit: 2016-03-26 | Discharge: 2016-03-26 | Disposition: A | Discharge status: Home / Self Care | Payer: PPO | Source: Ambulatory Visit | Attending: Radiation Oncology | Admitting: Radiation Oncology

## 2016-03-26 ENCOUNTER — Encounter: Payer: Self-pay | Admitting: Radiation Oncology

## 2016-03-26 ENCOUNTER — Ambulatory Visit: Admission: RE | Admit: 2016-03-26 | Payer: PPO | Source: Ambulatory Visit | Admitting: Urology

## 2016-03-26 VITALS — BP 137/79 | HR 78 | Temp 96.6°F | Resp 20 | Wt 262.2 lb

## 2016-03-26 DIAGNOSIS — C61 Malignant neoplasm of prostate: Secondary | ICD-10-CM | POA: Diagnosis not present

## 2016-03-26 SURGERY — ULTRASOUND, PROSTATE, FOR VOLUME DETERMINATION
Anesthesia: Choice

## 2016-03-26 MED ORDER — CEFAZOLIN IN D5W 1 GM/50ML IV SOLN
1.0000 g | INTRAVENOUS | Status: AC
  Administered 2016-04-22: 2 g via INTRAVENOUS

## 2016-03-26 MED ORDER — FLEET ENEMA 7-19 GM/118ML RE ENEM: 1.0000 | ENEMA | Freq: Once | RECTAL | Status: DC

## 2016-03-26 NOTE — H&P (Signed)
Except an outstanding is perfect of Radiation Oncology NEW PATIENT EVALUATION  Name: Christopher Orr  MRN: [id number]  Date:   03/26/2016     DOB: March 26, 1946   This 70 y.o. male patient presents to the clinic for history and physical prior to I-125 interstitial implant for adenocarcinoma the prostate  REFERRING PHYSICIAN: Jodi Marble, MD  CHIEF COMPLAINT:  Chief Complaint  Patient presents with  . Prostate Cancer    Pt is here post volume study    DIAGNOSIS: The encounter diagnosis was Malignant neoplasm of prostate ([employer]).   PREVIOUS INVESTIGATIONS:  Pathology reports reviewed Clinical notes reviewed  HPI: Patient is a 70 year old male who has been having a rising PSA over the past year 2. to approximate year ago up to 3.8 on 12/20/2015. This prompted transrectal ultrasound-guided biopsy showing 7 of 12 cores positive for Gleason 6 (3+3) adenocarcinoma in a 42 cc prostate. Most of disease was from the left lateral lobe. Patient has history of severe erectile dysfunction and some frequency and urgency of urination and occasional urge incontinence. He is currently on mybetriq. He's been seen by urology and is being considered for I-125 interstitial implant. He is otherwise doing well. Patient does have a history of cardiac stents and is currently on blood thinner medication. Patient is seen today for both history and physical preop as well as volume study in preparation for I-125 interstitial implant of the prostate.  PLANNED TREATMENT REGIMEN: I-125 interstitial implant  PAST MEDICAL HISTORY:  has a past medical history of Alcohol abuse; Anemia; BPH (benign prostatic hypertrophy) with urinary obstruction; CAD (coronary artery disease); Chronic pancreatitis ([place]); COPD (chronic obstructive pulmonary disease) ([place]); Depression; Diverticulosis; Drug use; ED (erectile dysfunction); GERD (gastroesophageal reflux disease); Hepatitis C; History of lumbar fusion; Hyperlipidemia;  Hypertension; Mitral regurgitation; and Urge incontinence.    PAST SURGICAL HISTORY:  Past Surgical History:  Procedure Laterality Date  . ESOPHAGOGASTRODUODENOSCOPY N/A 12/22/2014   Procedure: ESOPHAGOGASTRODUODENOSCOPY (EGD);  Surgeon: [name], MD;  Location[facility] ENDOSCOPY;  Service: Endoscopy;  Laterality: N/A;  . LUMBAR FUSION    . TONSILLECTOMY      FAMILY HISTORY: family history includes Colon cancer in his father; Heart disease in his father.  SOCIAL HISTORY:  reports that he quit smoking about 3 years ago. His smoking use included Cigarettes. He started smoking about 53 years ago. He smoked 1.50 packs per day. He has never used smokeless tobacco. He reports that he drinks about 8.4 oz of alcohol per week . He reports that he uses drugs, including Marijuana.  ALLERGIES: Review of patient's allergies indicates no known allergies.  MEDICATIONS:  Current Outpatient Prescriptions  Medication Sig Dispense Refill  . albuterol (PROVENTIL HFA;VENTOLIN HFA) 108 (90 BASE) MCG/ACT inhaler Inhale 2 puffs into the lungs every 6 (six) hours as needed for wheezing or shortness of breath.    Marland Kitchen albuterol (PROVENTIL) (2.5 MG/3ML) 0.083% nebulizer solution USE TWICE A DAY PER NEBULIZER  0  . ARIPiprazole (ABILIFY) 10 MG tablet Take 10 mg by mouth daily.    Marland Kitchen aspirin 81 MG tablet Take 81 mg by mouth daily. Reported on 01/15/2016    . clopidogrel (PLAVIX) 75 MG tablet Take 75 mg by mouth daily. Reported on 01/15/2016  5  . diazepam (VALIUM) 10 MG tablet     . FLUoxetine HCl 60 MG TABS Take 1 tablet by mouth daily.  1  . ketorolac (TORADOL) 10 MG tablet TAKE 1 TABLET ORALLY DAILY WITH FOOD FOR HEADACHE (NO  MORE THAN 1 TABLET IN 24 HOUR PERIOD)  0  . Multiple Vitamins-Minerals (MULTIVITAMIN WITH MINERALS) tablet Take 1 tablet by mouth daily.    Marland Kitchen MYRBETRIQ 50 MG TB24 tablet TAKE 1 TABLET (50 MG TOTAL) BY MOUTH DAILY.  11  . oxyCODONE-acetaminophen (ROXICET) 5-325 MG tablet Take 1-2 tablets by  mouth every 4 (four) hours as needed for severe pain. 20 tablet 0  . pantoprazole (PROTONIX) 40 MG tablet Take 40 mg by mouth daily. Reported on 09/14/2015  0  . rosuvastatin (CRESTOR) 10 MG tablet Take 10 mg by mouth daily.  1  . simvastatin (ZOCOR) 20 MG tablet Take 20 mg by mouth daily. Reported on 01/15/2016    . tadalafil (CIALIS) 5 MG tablet Take 5 mg by mouth daily as needed for erectile dysfunction. Reported on 09/14/2015    . tamsulosin (FLOMAX) 0.4 MG CAPS capsule Take 0.4 mg by mouth daily.  0  . thiamine 100 MG tablet Take 1 tablet (100 mg total) by mouth daily. 30 tablet 0  . tiotropium (SPIRIVA) 18 MCG inhalation capsule Place 18 mcg into inhaler and inhale daily.    . traZODone (DESYREL) 150 MG tablet Take 150 mg by mouth at bedtime.     No current facility-administered medications for this encounter.    Facility-Administered Medications Ordered in Other Encounters  Medication Dose Route Frequency Provider Last Rate Last Dose  . ceFAZolin (ANCEF) IVPB 1 g/50 mL premix  1 g Intravenous 30 min Pre-Op Hollice Espy, MD      . sodium phosphate (FLEET) 7-19 GM/118ML enema 1 enema  1 enema Rectal Once Hollice Espy, MD        ECOG PERFORMANCE STATUS:  0 - Asymptomatic  REVIEW OF SYSTEMS:  Patient denies any weight loss, fatigue, weakness, fever, chills or night sweats. Patient denies any loss of vision, blurred vision. Patient denies any ringing  of the ears or hearing loss. No irregular heartbeat. Patient denies heart murmur or history of fainting. Patient denies any chest pain or pain radiating to her upper extremities. Patient denies any shortness of breath, difficulty breathing at night, cough or hemoptysis. Patient denies any swelling in the lower legs. Patient denies any nausea vomiting, vomiting of blood, or coffee ground material in the vomitus. Patient denies any stomach pain. Patient states has had normal bowel movements no significant constipation or diarrhea. Patient denies  any dysuria, hematuria or significant nocturia. Patient denies any problems walking, swelling in the joints or loss of balance. Patient denies any skin changes, loss of hair or loss of weight. Patient denies any excessive worrying or anxiety or significant depression. Patient denies any problems with insomnia. Patient denies excessive thirst, polyuria, polydipsia. Patient denies any swollen glands, patient denies easy bruising or easy bleeding. Patient denies any recent infections, allergies or URI. Patient "s visual fields have not changed significantly in recent time.    PHYSICAL EXAM: BP 137/79   Pulse 78   Temp (!) 96.6 F (35.9 C)   Resp 20   Wt 262 lb 3.8 oz (118.9 kg)   BMI 35.57 kg/m  Well-developed well-nourished patient in NAD. HEENT reveals PERLA, EOMI, discs not visualized.  Oral cavity is clear. No oral mucosal lesions are identified. Neck is clear without evidence of cervical or supraclavicular adenopathy. Lungs are clear to A&P. Cardiac examination is essentially unremarkable with regular rate and rhythm without murmur rub or thrill. Abdomen is benign with no organomegaly or masses noted. Motor sensory and DTR levels are equal  and symmetric in the upper and lower extremities. Cranial nerves II through XII are grossly intact. Proprioception is intact. No peripheral adenopathy or edema is identified. No motor or sensory levels are noted. Crude visual fields are within normal range.  LABORATORY DATA: Pathology reports reviewed    RADIOLOGY RESULTS: No current films for review   IMPRESSION: Stage I adenocarcinoma the prostate Gleason score 6 presenting with a PSA of 30.76 in 70 year old male  PLAN: At this time patient is been cleared by cardiology for his I-125 interstitial implant. An clear to stop his Plavix prior to the implant. Via study has been performed today and we will use BrachyVision treatment planning to decide positioning of seed implant as well as needles and number of  total seeds. Risks and benefits of treatment including increasing lower urinary tract symptoms possible diarrhea fatigue and side effects associated with general anesthesia all were reviewed were reviewed in detail with the patient. He seems to comprehend my treatment plan well.  I would like to take this opportunity to thank you for allowing me to participate in the care of your patient.Armstead Peaks., MD

## 2016-03-26 NOTE — Progress Notes (Signed)
Radiation Oncology Follow up Note  Name: Christopher Orr   Date:   03/26/2016 MRN:  [id number] DOB: 10-17-1945    This 70 y.o. male presents to the clinic today for history and physical preop as well as volume study.  REFERRING PROVIDER: Jodi Marble, MD  HPI: Patient is a 70 year old male with known stage I adenocarcinoma the prostate (T1 CN 0 M0) Gleason score of 6 (3+3) presenting the PSA of 3.8. He has a history of cardiac stents and currently on blood thinner medication. We evaluated him and recommended I-125 interstitial implant. He's been seen by Dr. Yancey Flemings and has had clearance to stop his Plavix prior to I-125 interstitial implant and should be fine to go ahead with the procedure. He is seen today and doing well. He again has erectile dysfunction and mild frequency and urgency of urination and urge incontinence occasionally.Marland Kitchen He is seen today for history and physical preop as well as volume study.  COMPLICATIONS OF TREATMENT: none  FOLLOW UP COMPLIANCE: keeps appointments   PHYSICAL EXAM:  There were no vitals taken for this visit. Well-developed well-nourished patient in NAD. HEENT reveals PERLA, EOMI, discs not visualized.  Oral cavity is clear. No oral mucosal lesions are identified. Neck is clear without evidence of cervical or supraclavicular adenopathy. Lungs are clear to A&P. Cardiac examination is essentially unremarkable with regular rate and rhythm without murmur rub or thrill. Abdomen is benign with no organomegaly or masses noted. Motor sensory and DTR levels are equal and symmetric in the upper and lower extremities. Cranial nerves II through XII are grossly intact. Proprioception is intact. No peripheral adenopathy or edema is identified. No motor or sensory levels are noted. Crude visual fields are within normal range.  RADIOLOGY RESULTS: Ultrasound used for volume study  PLAN: Patient was taken to the cystoscopy suite in the OR. Patient was placed in the low  lithotomy position. Foley catheter was placed. Trans-rectal ultrasound probe was inserted into the rectum and prostate seminal vesicles were visualized as well as bladder base. stepping images were performed on a 5 mm increments. Images will be placed in BrachyVision treatment planning system to determine seed placement coordinates for eventual I-125 interstitial implant. Images will be reviewed with the physics and dosimetry staff for final quality approval. I personally was present for the volume study and assisted in delineation of contour volumes.  At the end of the procedure Foley catheter was removed, rectal ultrasound probe was removed. Patient tolerated his procedures extremely well with no side effects or complaints. Patient has given appointment for interstitial implant date. Consent was signed today as well as history and physical performed in preparation for his outpatient surgical implant.      Armstead Peaks., MD

## 2016-03-27 DIAGNOSIS — R7301 Impaired fasting glucose: Secondary | ICD-10-CM | POA: Diagnosis not present

## 2016-03-27 DIAGNOSIS — E785 Hyperlipidemia, unspecified: Secondary | ICD-10-CM | POA: Diagnosis not present

## 2016-03-27 DIAGNOSIS — I10 Essential (primary) hypertension: Secondary | ICD-10-CM | POA: Diagnosis not present

## 2016-04-01 DIAGNOSIS — C61 Malignant neoplasm of prostate: Secondary | ICD-10-CM | POA: Diagnosis not present

## 2016-04-08 ENCOUNTER — Encounter
Admission: RE | Admit: 2016-04-08 | Discharge: 2016-04-08 | Disposition: A | Discharge status: Home / Self Care | Payer: PPO | Source: Ambulatory Visit | Attending: Urology | Admitting: Urology

## 2016-04-08 DIAGNOSIS — Z01818 Encounter for other preprocedural examination: Secondary | ICD-10-CM | POA: Insufficient documentation

## 2016-04-08 DIAGNOSIS — I10 Essential (primary) hypertension: Secondary | ICD-10-CM | POA: Diagnosis not present

## 2016-04-08 HISTORY — DX: Malignant (primary) neoplasm, unspecified: C80.1

## 2016-04-08 LAB — CBC
HEMATOCRIT: 40.3 % (ref 40.0–52.0)
HEMOGLOBIN: 13.4 g/dL (ref 13.0–18.0)
MCH: 33 pg (ref 26.0–34.0)
MCHC: 33.4 g/dL (ref 32.0–36.0)
MCV: 98.8 fL (ref 80.0–100.0)
PLATELETS: 260 10*3/uL (ref 150–440)
RBC: 4.08 MIL/uL — AB (ref 4.40–5.90)
RDW: 14.5 % (ref 11.5–14.5)
WBC: 7.8 10*3/uL (ref 3.8–10.6)

## 2016-04-08 NOTE — Patient Instructions (Signed)
Your procedure is scheduled on: April 22, 2016 Su procedimiento est programado para: Report to : [place] a: To find out your arrival time please call [phone] between 1PM - 3PM on Monday 25, 2017 Para saber su hora de llegada por favor llame al ([place]:  Remember: Instructions that are not followed completely may result in serious medical risk, up to and including death, or upon the discretion of your surgeon and anesthesiologist your surgery may need to be rescheduled.  Recuerde: Las instrucciones que no se siguen completamente Heritage manager en un riesgo de salud grave, incluyendo hasta la [place] o a discrecin de su cirujano y Environmental health practitioner, su ciruga se puede posponer.   __X__ 1. Do not eat food or drink liquids after midnight. No gum chewing or hard candies.  No coma alimentos ni tome lquidos despus de la medianoche.  No mastique chicle ni caramelos  duros.     _X___ 2. No alcohol for 24 hours before or after surgery.    No tome alcohol durante las 24 horas antes ni despus de la [place].   __X__ 3. Bring all medications with you on the day of surgery if instructed.    BRING ANY NEW MEDICATION ON DAY OF SURGERY    Lleve todos los medicamentos con usted el da de su ciruga si se le ha indicado as.   __X__ 4. Notify your doctor if there is any change in your medical condition (cold, fever,                             infections).    Informe a su mdico si hay algn cambio en su condicin mdica (resfriado, fiebre, infecciones).   Do not wear jewelry, make-up, hairpins, clips or nail polish.  No use joyas, maquillajes, pinzas/ganchos para el cabello ni esmalte de uas.  Do not wear lotions, powders, or perfumes. You may wear deodorant.  No use lociones, polvos o perfumes.  Puede usar desodorante.    Do not shave 48 hours prior to surgery. Men may shave face and  neck.  No se afeite 48 horas antes de la [place].  Los hombres pueden [employer] cara y el cuello.   Do not bring valuables to the hospital.   No lleve objetos [place] is not responsible for any belongings or valuables.  [place] no se hace responsable de ningn tipo de pertenencias u objetos de Geographical information systems officer.               Contacts, dentures or bridgework may not be worn into surgery.  Los lentes de [place], las dentaduras postizas o puentes no se pueden usar en la [place].  Leave your suitcase in the car. After surgery it may be brought to your room.  Deje su maleta en el auto.  Despus de la ciruga podr traerla a su habitacin.  For patients admitted to the hospital, discharge time is determined by your treatment team.  Para los pacientes que sean ingresados al hospital, el tiempo en el cual se le dar de alta es determinado por su                equipo de Center.   Patients discharged the day of surgery will not be allowed to drive home. A los pacientes que se les da  de alta el mismo da de la ciruga no se les permitir conducir a casa.   Please read over the following fact sheets that you were given: Por favor [place] informacin que le dieron:   SURGICAL INFECTION PREVENTION SHEETS   ____ Take these medicines the morning of surgery with A SIP OF WATER:          [employer] estas medicinas la maana de la ciruga con UN SORBO DE AGUA:  1. FLUOXETINE  2. ABILIFY  3. MYRBETRIQ   4. PANTOPRAZOLE      5. CRESTOR  6. VALIUM              7  FLOMAX  ____ Fleet Enema (as directed)          Enema de Fleet (segn lo indicado)    ____ Use CHG Soap as directed          Utilice el jabn de CHG segn lo indicado  __X__ Use inhalers on the day of surgery USE NEBULIZER          Use los inhaladores el da de la ciruga  ____ Stop metformin 2 days prior to surgery          Deje de tomar el metformin 2 das antes de la ciruga    ____ Take  1/2 of usual insulin dose the night before surgery and none on the morning of surgery           Tome la mitad de la dosis habitual de insulina la noche antes de la [place] y no tome nada en la maana de la             ciruga  __X__ Stop Coumadin/Plavix/aspirin AS DIRECTED BY DR Erlene Quan  AND CONTACT DR Erlene Quan WHEN TO STOP EFFIENT          Deje de tomar el Coumadin/Plavix/aspirina el da:  _ X___ Stop Anti-inflammatories on SE[TEMBER 19, 2017 IBUPROFEN, MOTRIN ,ADVIL, ALEVE, KETOROLAC(TORADOL)          Deje de tomar antiinflamatorios el da:   ____ Stop supplements until after surgery            Deje de tomar suplementos hasta despus de la ciruga  ____ Bring C-Pap to the hospital          [place] al hospital

## 2016-04-08 NOTE — Pre-Procedure Instructions (Signed)
Patient called back to say he does not take effient any more.

## 2016-04-10 NOTE — Pre-Procedure Instructions (Signed)
EKG COMPARED WITH 1/16

## 2016-04-14 DIAGNOSIS — C61 Malignant neoplasm of prostate: Secondary | ICD-10-CM | POA: Diagnosis not present

## 2016-04-21 ENCOUNTER — Encounter
Admission: RE | Admit: 2016-04-21 | Discharge: 2016-04-21 | Disposition: A | Discharge status: Home / Self Care | Payer: PPO | Source: Ambulatory Visit | Attending: Urology | Admitting: Urology

## 2016-04-21 DIAGNOSIS — K219 Gastro-esophageal reflux disease without esophagitis: Secondary | ICD-10-CM | POA: Diagnosis not present

## 2016-04-21 DIAGNOSIS — Z79899 Other long term (current) drug therapy: Secondary | ICD-10-CM | POA: Diagnosis not present

## 2016-04-21 DIAGNOSIS — N138 Other obstructive and reflux uropathy: Secondary | ICD-10-CM | POA: Diagnosis not present

## 2016-04-21 DIAGNOSIS — N401 Enlarged prostate with lower urinary tract symptoms: Secondary | ICD-10-CM | POA: Diagnosis not present

## 2016-04-21 DIAGNOSIS — J449 Chronic obstructive pulmonary disease, unspecified: Secondary | ICD-10-CM | POA: Diagnosis not present

## 2016-04-21 DIAGNOSIS — I2510 Atherosclerotic heart disease of native coronary artery without angina pectoris: Secondary | ICD-10-CM | POA: Diagnosis not present

## 2016-04-21 DIAGNOSIS — N39498 Other specified urinary incontinence: Secondary | ICD-10-CM | POA: Diagnosis not present

## 2016-04-21 DIAGNOSIS — Z7982 Long term (current) use of aspirin: Secondary | ICD-10-CM | POA: Diagnosis not present

## 2016-04-21 DIAGNOSIS — K5790 Diverticulosis of intestine, part unspecified, without perforation or abscess without bleeding: Secondary | ICD-10-CM | POA: Diagnosis not present

## 2016-04-21 DIAGNOSIS — F329 Major depressive disorder, single episode, unspecified: Secondary | ICD-10-CM | POA: Diagnosis not present

## 2016-04-21 DIAGNOSIS — K861 Other chronic pancreatitis: Secondary | ICD-10-CM | POA: Diagnosis not present

## 2016-04-21 DIAGNOSIS — R350 Frequency of micturition: Secondary | ICD-10-CM | POA: Diagnosis not present

## 2016-04-21 DIAGNOSIS — I10 Essential (primary) hypertension: Secondary | ICD-10-CM | POA: Diagnosis not present

## 2016-04-21 DIAGNOSIS — Z800 Family history of malignant neoplasm of digestive organs: Secondary | ICD-10-CM | POA: Diagnosis not present

## 2016-04-21 DIAGNOSIS — Z87891 Personal history of nicotine dependence: Secondary | ICD-10-CM | POA: Diagnosis not present

## 2016-04-21 DIAGNOSIS — Z9889 Other specified postprocedural states: Secondary | ICD-10-CM | POA: Diagnosis not present

## 2016-04-21 DIAGNOSIS — N3941 Urge incontinence: Secondary | ICD-10-CM | POA: Diagnosis not present

## 2016-04-21 DIAGNOSIS — Z8249 Family history of ischemic heart disease and other diseases of the circulatory system: Secondary | ICD-10-CM | POA: Diagnosis not present

## 2016-04-21 DIAGNOSIS — I340 Nonrheumatic mitral (valve) insufficiency: Secondary | ICD-10-CM | POA: Diagnosis not present

## 2016-04-21 DIAGNOSIS — C61 Malignant neoplasm of prostate: Secondary | ICD-10-CM | POA: Diagnosis not present

## 2016-04-21 DIAGNOSIS — Z7902 Long term (current) use of antithrombotics/antiplatelets: Secondary | ICD-10-CM | POA: Diagnosis not present

## 2016-04-21 DIAGNOSIS — B1920 Unspecified viral hepatitis C without hepatic coma: Secondary | ICD-10-CM | POA: Diagnosis not present

## 2016-04-21 DIAGNOSIS — E785 Hyperlipidemia, unspecified: Secondary | ICD-10-CM | POA: Diagnosis not present

## 2016-04-21 DIAGNOSIS — N529 Male erectile dysfunction, unspecified: Secondary | ICD-10-CM | POA: Diagnosis not present

## 2016-04-21 LAB — PROTIME-INR
INR: 0.92
PROTHROMBIN TIME: 12.3 s (ref 11.4–15.2)

## 2016-04-22 ENCOUNTER — Ambulatory Visit: Payer: PPO | Admitting: Anesthesiology

## 2016-04-22 ENCOUNTER — Encounter
Admission: RE | Disposition: A | Discharge status: Home / Self Care | Payer: Self-pay | Source: Ambulatory Visit | Attending: Urology

## 2016-04-22 ENCOUNTER — Ambulatory Visit
Admission: RE | Admit: 2016-04-22 | Discharge: 2016-04-22 | Disposition: A | Discharge status: Home / Self Care | Payer: PPO | Source: Ambulatory Visit | Attending: Radiation Oncology | Admitting: Radiation Oncology

## 2016-04-22 ENCOUNTER — Encounter: Payer: Self-pay | Admitting: *Deleted

## 2016-04-22 ENCOUNTER — Ambulatory Visit
Admission: RE | Admit: 2016-04-22 | Discharge: 2016-04-22 | Disposition: A | Discharge status: Home / Self Care | Payer: PPO | Source: Ambulatory Visit | Attending: Urology | Admitting: Urology

## 2016-04-22 DIAGNOSIS — C61 Malignant neoplasm of prostate: Secondary | ICD-10-CM

## 2016-04-22 DIAGNOSIS — I340 Nonrheumatic mitral (valve) insufficiency: Secondary | ICD-10-CM | POA: Insufficient documentation

## 2016-04-22 DIAGNOSIS — N401 Enlarged prostate with lower urinary tract symptoms: Secondary | ICD-10-CM | POA: Insufficient documentation

## 2016-04-22 DIAGNOSIS — K8681 Exocrine pancreatic insufficiency: Secondary | ICD-10-CM | POA: Insufficient documentation

## 2016-04-22 DIAGNOSIS — Z800 Family history of malignant neoplasm of digestive organs: Secondary | ICD-10-CM | POA: Insufficient documentation

## 2016-04-22 DIAGNOSIS — N39498 Other specified urinary incontinence: Secondary | ICD-10-CM | POA: Insufficient documentation

## 2016-04-22 DIAGNOSIS — B1920 Unspecified viral hepatitis C without hepatic coma: Secondary | ICD-10-CM | POA: Insufficient documentation

## 2016-04-22 DIAGNOSIS — D649 Anemia, unspecified: Secondary | ICD-10-CM | POA: Diagnosis not present

## 2016-04-22 DIAGNOSIS — Z79899 Other long term (current) drug therapy: Secondary | ICD-10-CM | POA: Insufficient documentation

## 2016-04-22 DIAGNOSIS — N3941 Urge incontinence: Secondary | ICD-10-CM | POA: Insufficient documentation

## 2016-04-22 DIAGNOSIS — Z9889 Other specified postprocedural states: Secondary | ICD-10-CM | POA: Insufficient documentation

## 2016-04-22 DIAGNOSIS — K861 Other chronic pancreatitis: Secondary | ICD-10-CM | POA: Insufficient documentation

## 2016-04-22 DIAGNOSIS — J449 Chronic obstructive pulmonary disease, unspecified: Secondary | ICD-10-CM | POA: Insufficient documentation

## 2016-04-22 DIAGNOSIS — K219 Gastro-esophageal reflux disease without esophagitis: Secondary | ICD-10-CM | POA: Insufficient documentation

## 2016-04-22 DIAGNOSIS — Z7902 Long term (current) use of antithrombotics/antiplatelets: Secondary | ICD-10-CM | POA: Insufficient documentation

## 2016-04-22 DIAGNOSIS — Z87891 Personal history of nicotine dependence: Secondary | ICD-10-CM | POA: Insufficient documentation

## 2016-04-22 DIAGNOSIS — I10 Essential (primary) hypertension: Secondary | ICD-10-CM | POA: Insufficient documentation

## 2016-04-22 DIAGNOSIS — K5790 Diverticulosis of intestine, part unspecified, without perforation or abscess without bleeding: Secondary | ICD-10-CM | POA: Insufficient documentation

## 2016-04-22 DIAGNOSIS — I2510 Atherosclerotic heart disease of native coronary artery without angina pectoris: Secondary | ICD-10-CM | POA: Diagnosis not present

## 2016-04-22 DIAGNOSIS — R350 Frequency of micturition: Secondary | ICD-10-CM | POA: Insufficient documentation

## 2016-04-22 DIAGNOSIS — F329 Major depressive disorder, single episode, unspecified: Secondary | ICD-10-CM | POA: Insufficient documentation

## 2016-04-22 DIAGNOSIS — Z7982 Long term (current) use of aspirin: Secondary | ICD-10-CM | POA: Insufficient documentation

## 2016-04-22 DIAGNOSIS — Z8249 Family history of ischemic heart disease and other diseases of the circulatory system: Secondary | ICD-10-CM | POA: Insufficient documentation

## 2016-04-22 DIAGNOSIS — N138 Other obstructive and reflux uropathy: Secondary | ICD-10-CM | POA: Insufficient documentation

## 2016-04-22 DIAGNOSIS — N529 Male erectile dysfunction, unspecified: Secondary | ICD-10-CM | POA: Insufficient documentation

## 2016-04-22 DIAGNOSIS — E785 Hyperlipidemia, unspecified: Secondary | ICD-10-CM | POA: Insufficient documentation

## 2016-04-22 HISTORY — PX: RADIOACTIVE SEED IMPLANT: SHX5150

## 2016-04-22 LAB — URINE DRUG SCREEN, QUALITATIVE (ARMC ONLY)
AMPHETAMINES, UR SCREEN: NOT DETECTED
BARBITURATES, UR SCREEN: NOT DETECTED
BENZODIAZEPINE, UR SCRN: POSITIVE — AB
COCAINE METABOLITE, UR ~~LOC~~: NOT DETECTED
Cannabinoid 50 Ng, Ur ~~LOC~~: POSITIVE — AB
MDMA (Ecstasy)Ur Screen: NOT DETECTED
METHADONE SCREEN, URINE: NOT DETECTED
Opiate, Ur Screen: NOT DETECTED
Phencyclidine (PCP) Ur S: NOT DETECTED
TRICYCLIC, UR SCREEN: NOT DETECTED

## 2016-04-22 SURGERY — INSERTION, RADIATION SOURCE, PROSTATE
Anesthesia: General | Wound class: Clean Contaminated

## 2016-04-22 MED ORDER — CIPROFLOXACIN HCL 500 MG PO TABS: 500.0000 mg | ORAL_TABLET | Freq: Two times a day (BID) | ORAL | 0 refills | Status: DC

## 2016-04-22 MED ORDER — IPRATROPIUM-ALBUTEROL 0.5-2.5 (3) MG/3ML IN SOLN
3.0000 mL | Freq: Once | RESPIRATORY_TRACT | Status: AC
  Administered 2016-04-22: 3 mL via RESPIRATORY_TRACT

## 2016-04-22 MED ORDER — SUCCINYLCHOLINE CHLORIDE 20 MG/ML IJ SOLN
INTRAMUSCULAR | Status: DC | PRN
  Administered 2016-04-22: 100 mg via INTRAVENOUS

## 2016-04-22 MED ORDER — METHYLPREDNISOLONE SODIUM SUCC 125 MG IJ SOLR
INTRAMUSCULAR | Status: AC
  Administered 2016-04-22: 125 mg via INTRAVENOUS
  Filled 2016-04-22: qty 2

## 2016-04-22 MED ORDER — PROPOFOL 10 MG/ML IV BOLUS
INTRAVENOUS | Status: DC | PRN
  Administered 2016-04-22: 150 mg via INTRAVENOUS
  Administered 2016-04-22 (×2): 50 mg via INTRAVENOUS

## 2016-04-22 MED ORDER — ONDANSETRON HCL 4 MG/2ML IJ SOLN
INTRAMUSCULAR | Status: DC | PRN
  Administered 2016-04-22: 4 mg via INTRAVENOUS

## 2016-04-22 MED ORDER — DOCUSATE SODIUM 100 MG PO CAPS: 100.0000 mg | ORAL_CAPSULE | Freq: Two times a day (BID) | ORAL | 0 refills | Status: DC

## 2016-04-22 MED ORDER — LACTATED RINGERS IV SOLN
INTRAVENOUS | Status: DC
  Administered 2016-04-22 (×2): via INTRAVENOUS

## 2016-04-22 MED ORDER — MIDAZOLAM HCL 2 MG/2ML IJ SOLN
INTRAMUSCULAR | Status: DC | PRN
  Administered 2016-04-22: 2 mg via INTRAVENOUS

## 2016-04-22 MED ORDER — LIDOCAINE HCL (CARDIAC) 20 MG/ML IV SOLN
INTRAVENOUS | Status: DC | PRN
  Administered 2016-04-22: 100 mg via INTRAVENOUS

## 2016-04-22 MED ORDER — METHYLPREDNISOLONE SODIUM SUCC 125 MG IJ SOLR
125.0000 mg | Freq: Once | INTRAMUSCULAR | Status: AC
  Administered 2016-04-22: 125 mg via INTRAVENOUS

## 2016-04-22 MED ORDER — SUGAMMADEX SODIUM 200 MG/2ML IV SOLN
INTRAVENOUS | Status: DC | PRN
  Administered 2016-04-22: 239.6 mg via INTRAVENOUS

## 2016-04-22 MED ORDER — FENTANYL CITRATE (PF) 100 MCG/2ML IJ SOLN
INTRAMUSCULAR | Status: AC
  Administered 2016-04-22: 25 ug via INTRAVENOUS
  Filled 2016-04-22: qty 2

## 2016-04-22 MED ORDER — EPHEDRINE SULFATE 50 MG/ML IJ SOLN
INTRAMUSCULAR | Status: DC | PRN
  Administered 2016-04-22: 10 mg via INTRAVENOUS

## 2016-04-22 MED ORDER — OXYCODONE-ACETAMINOPHEN 5-325 MG PO TABS: 1.0000 | ORAL_TABLET | ORAL | 0 refills | Status: DC | PRN

## 2016-04-22 MED ORDER — PHENYLEPHRINE HCL 10 MG/ML IJ SOLN
INTRAMUSCULAR | Status: DC | PRN
  Administered 2016-04-22 (×4): 100 ug via INTRAVENOUS

## 2016-04-22 MED ORDER — ROCURONIUM BROMIDE 100 MG/10ML IV SOLN
INTRAVENOUS | Status: DC | PRN
  Administered 2016-04-22: 10 mg via INTRAVENOUS
  Administered 2016-04-22: 5 mg via INTRAVENOUS
  Administered 2016-04-22: 10 mg via INTRAVENOUS

## 2016-04-22 MED ORDER — ONDANSETRON HCL 4 MG/2ML IJ SOLN: 4.0000 mg | Freq: Once | INTRAMUSCULAR | Status: DC | PRN

## 2016-04-22 MED ORDER — IPRATROPIUM-ALBUTEROL 0.5-2.5 (3) MG/3ML IN SOLN
RESPIRATORY_TRACT | Status: AC
  Administered 2016-04-22: 3 mL via RESPIRATORY_TRACT
  Filled 2016-04-22: qty 3

## 2016-04-22 MED ORDER — BACITRACIN ZINC 500 UNIT/GM EX OINT
TOPICAL_OINTMENT | CUTANEOUS | Status: AC
  Filled 2016-04-22: qty 28.35

## 2016-04-22 MED ORDER — HYDROMORPHONE HCL 1 MG/ML IJ SOLN
INTRAMUSCULAR | Status: DC | PRN
  Administered 2016-04-22: .5 mg via INTRAVENOUS
  Administered 2016-04-22: .6 mg via INTRAVENOUS

## 2016-04-22 MED ORDER — FENTANYL CITRATE (PF) 100 MCG/2ML IJ SOLN
INTRAMUSCULAR | Status: DC | PRN
  Administered 2016-04-22: 50 ug via INTRAVENOUS

## 2016-04-22 MED ORDER — FENTANYL CITRATE (PF) 100 MCG/2ML IJ SOLN
25.0000 ug | INTRAMUSCULAR | Status: DC | PRN
  Administered 2016-04-22 (×2): 25 ug via INTRAVENOUS

## 2016-04-22 SURGICAL SUPPLY — 31 items
BAG URO DRAIN 2000ML W/SPOUT (MISCELLANEOUS) ×2 IMPLANT
BLADE CLIPPER SURG (BLADE) ×2 IMPLANT
CATH FOL 2WAY LX 16X5 (CATHETERS) ×2 IMPLANT
CATH ROBINSON RED A/P 16FR (CATHETERS) IMPLANT
DRAPE INCISE 23X17 IOBAN STRL (DRAPES) ×1
DRAPE INCISE IOBAN 23X17 STRL (DRAPES) ×1 IMPLANT
DRAPE SHEET LG 3/4 BI-LAMINATE (DRAPES) ×2 IMPLANT
DRAPE TABLE BACK 80X90 (DRAPES) ×2 IMPLANT
DRAPE UNDER BUTTOCK W/FLU (DRAPES) IMPLANT
DRSG TELFA 3X8 NADH (GAUZE/BANDAGES/DRESSINGS) ×2 IMPLANT
GLOVE BIO SURGEON STRL SZ 6.5 (GLOVE) ×4 IMPLANT
GLOVE BIO SURGEON STRL SZ7 (GLOVE) ×4 IMPLANT
GLOVE BIO SURGEON STRL SZ7.5 (GLOVE) ×4 IMPLANT
GOWN STRL REUS W/ TWL LRG LVL3 (GOWN DISPOSABLE) ×2 IMPLANT
GOWN STRL REUS W/ TWL XL LVL3 (GOWN DISPOSABLE) ×1 IMPLANT
GOWN STRL REUS W/TWL LRG LVL3 (GOWN DISPOSABLE) ×2
GOWN STRL REUS W/TWL XL LVL3 (GOWN DISPOSABLE) ×1
GRADUATE 1200CC STRL 31836 (MISCELLANEOUS) IMPLANT
IV NS 1000ML (IV SOLUTION) ×1
IV NS 1000ML BAXH (IV SOLUTION) ×1 IMPLANT
KIT RM TURNOVER CYSTO AR (KITS) ×2 IMPLANT
PACK CYSTO AR (MISCELLANEOUS) ×2 IMPLANT
PAD PREP 24X41 OB/GYN DISP (PERSONAL CARE ITEMS) ×2 IMPLANT
SET CYSTO W/LG BORE CLAMP LF (SET/KITS/TRAYS/PACK) ×2 IMPLANT
SOL PREP PVP 2OZ (MISCELLANEOUS) ×2
SOLUTION PREP PVP 2OZ (MISCELLANEOUS) ×1 IMPLANT
SURGILUBE 2OZ TUBE FLIPTOP (MISCELLANEOUS) ×2 IMPLANT
SYRINGE 10CC LL (SYRINGE) ×2 IMPLANT
SYRINGE IRR TOOMEY STRL 70CC (SYRINGE) IMPLANT
WATER STERILE IRR 1000ML POUR (IV SOLUTION) ×2 IMPLANT
WATER STERILE IRR 3000ML UROMA (IV SOLUTION) IMPLANT

## 2016-04-22 NOTE — Interval H&P Note (Signed)
History and Physical Interval Note:  04/22/2016 7:20 AM  Christopher Orr  has presented today for surgery, with the diagnosis of PROSTATE CANCER  The various methods of treatment have been discussed with the patient and family. After consideration of risks, benefits and other options for treatment, the patient has consented to  Procedure(s): RADIOACTIVE SEED IMPLANT/BRACHYTHERAPY IMPLANT (N/A) as a surgical intervention .  The patient's history has been reviewed, patient examined, no change in status, stable for surgery.  I have reviewed the patient's chart and labs.  Questions were answered to the patient's satisfaction.    RRR CTAB   Hollice Espy

## 2016-04-22 NOTE — Op Note (Signed)
04/22/16  Preoperative diagnosis: Adenocarcinoma of the prostate   Postoperative diagnosis: Same   Procedure: I-125 prostate seed implantation, cystoscopy  Surgeon: Hollice Espy M.D. , Lavena Stanford, M.D.   Anesthesia: General  Drains: none  Complications: none  Indications: 70 yo M with prostate cancer, T1c, Gleason 3+3 involving 7/12 cores including all cores on left and single core at right apex up to 70% tissue with rising PSA.    Procedure: Patient was brought to operating suite and placement table in the supine position. At this time, a universal timeout protocol was performed, all team members were identified, Venodyne boots are placed, and he was administered IV Ancef in the preoperative period. He was placed in lithotomy position and prepped and draped in usual manner. Radiation oncology department placed a transrectal ultrasound probe anchoring stand/ grid and aligned with previous imaging from the volume study. Foley catheter was inserted without difficulty.  All needle passage was done with real-time transrectal ultrasound guidance in both the transverse and sagittal plains in order to achieve the desired preplanned position. A total of 22 needles were placed.  93 active seeds were implanted. The Foley catheter was removed and a rigid cystoscopy failed to show any seeds outside the prostate without evidence of trauma to the urethral, prostatic fossa, or bladder.  The bladder was drained.  A fluoroscopic image was then obtained showing excellent   distrubution of the brachytherapy seeds.  Each seed was counted and counts were correct.    The patient was then repositioned in the supine position, reversed from anesthesia, and taken to the PACU in stable condition.   Hollice Espy, MD

## 2016-04-22 NOTE — Transfer of Care (Signed)
Immediate Anesthesia Transfer of Care Note  Patient: Christopher Orr  Procedure(s) Performed: Procedure(s): RADIOACTIVE SEED IMPLANT/BRACHYTHERAPY IMPLANT (N/A)  Patient Location: PACU  Anesthesia Type:General  Level of Consciousness: sedated  Airway & Oxygen Therapy: Patient Spontanous Breathing and Patient connected to face mask oxygen  Post-op Assessment: Report given to RN and Post -op Vital signs reviewed and stable  Post vital signs: Reviewed and stable  Last Vitals:  Vitals:   04/22/16 0618 04/22/16 0910  BP: 107/80 123/74  Pulse: 81 72  Resp: 16 14  Temp: (!) 35.8 C 37.1 C    Last Pain:  Vitals:   04/22/16 0618  TempSrc: Oral         Complications: No apparent anesthesia complications

## 2016-04-22 NOTE — OR Nursing (Signed)
Dr Kayleen Memos in at bedside aware of fluctuating o2 sat, 86-97%  preop  sats 90-92.  sats increase with deep breathing,  Pt states feels fine.

## 2016-04-22 NOTE — H&P (View-Only) (Signed)
Except an outstanding is perfect of Radiation Oncology NEW PATIENT EVALUATION  Name: Christopher LEPERA  MRN: [id number]  Date:   03/26/2016     DOB: October 02, 1945   This 70 y.o. male patient presents to the clinic for history and physical prior to I-125 interstitial implant for adenocarcinoma the prostate  REFERRING PHYSICIAN: Jodi Marble, MD  CHIEF COMPLAINT:  Chief Complaint  Patient presents with  . Prostate Cancer    Pt is here post volume study    DIAGNOSIS: The encounter diagnosis was Malignant neoplasm of prostate ([place]).   PREVIOUS INVESTIGATIONS:  Pathology reports reviewed Clinical notes reviewed  HPI: Patient is a 70 year old male who has been having a rising PSA over the past year 2. to approximate year ago up to 3.8 on 12/20/2015. This prompted transrectal ultrasound-guided biopsy showing 7 of 12 cores positive for Gleason 6 (3+3) adenocarcinoma in a 42 cc prostate. Most of disease was from the left lateral lobe. Patient has history of severe erectile dysfunction and some frequency and urgency of urination and occasional urge incontinence. He is currently on mybetriq. He's been seen by urology and is being considered for I-125 interstitial implant. He is otherwise doing well. Patient does have a history of cardiac stents and is currently on blood thinner medication. Patient is seen today for both history and physical preop as well as volume study in preparation for I-125 interstitial implant of the prostate.  PLANNED TREATMENT REGIMEN: I-125 interstitial implant  PAST MEDICAL HISTORY:  has a past medical history of Alcohol abuse; Anemia; BPH (benign prostatic hypertrophy) with urinary obstruction; CAD (coronary artery disease); Chronic pancreatitis ([place]); COPD (chronic obstructive pulmonary disease) ([place]); Depression; Diverticulosis; Drug use; ED (erectile dysfunction); GERD (gastroesophageal reflux disease); Hepatitis C; History of lumbar fusion; Hyperlipidemia;  Hypertension; Mitral regurgitation; and Urge incontinence.    PAST SURGICAL HISTORY:  Past Surgical History:  Procedure Laterality Date  . ESOPHAGOGASTRODUODENOSCOPY N/A 12/22/2014   Procedure: ESOPHAGOGASTRODUODENOSCOPY (EGD);  Surgeon: [name], MD;  Location[facility] ENDOSCOPY;  Service: Endoscopy;  Laterality: N/A;  . LUMBAR FUSION    . TONSILLECTOMY      FAMILY HISTORY: family history includes Colon cancer in his father; Heart disease in his father.  SOCIAL HISTORY:  reports that he quit smoking about 3 years ago. His smoking use included Cigarettes. He started smoking about 53 years ago. He smoked 1.50 packs per day. He has never used smokeless tobacco. He reports that he drinks about 8.4 oz of alcohol per week . He reports that he uses drugs, including Marijuana.  ALLERGIES: Review of patient's allergies indicates no known allergies.  MEDICATIONS:  Current Outpatient Prescriptions  Medication Sig Dispense Refill  . albuterol (PROVENTIL HFA;VENTOLIN HFA) 108 (90 BASE) MCG/ACT inhaler Inhale 2 puffs into the lungs every 6 (six) hours as needed for wheezing or shortness of breath.    Marland Kitchen albuterol (PROVENTIL) (2.5 MG/3ML) 0.083% nebulizer solution USE TWICE A DAY PER NEBULIZER  0  . ARIPiprazole (ABILIFY) 10 MG tablet Take 10 mg by mouth daily.    Marland Kitchen aspirin 81 MG tablet Take 81 mg by mouth daily. Reported on 01/15/2016    . clopidogrel (PLAVIX) 75 MG tablet Take 75 mg by mouth daily. Reported on 01/15/2016  5  . diazepam (VALIUM) 10 MG tablet     . FLUoxetine HCl 60 MG TABS Take 1 tablet by mouth daily.  1  . ketorolac (TORADOL) 10 MG tablet TAKE 1 TABLET ORALLY DAILY WITH FOOD FOR HEADACHE (NO  MORE THAN 1 TABLET IN 24 HOUR PERIOD)  0  . Multiple Vitamins-Minerals (MULTIVITAMIN WITH MINERALS) tablet Take 1 tablet by mouth daily.    Marland Kitchen MYRBETRIQ 50 MG TB24 tablet TAKE 1 TABLET (50 MG TOTAL) BY MOUTH DAILY.  11  . oxyCODONE-acetaminophen (ROXICET) 5-325 MG tablet Take 1-2 tablets by  mouth every 4 (four) hours as needed for severe pain. 20 tablet 0  . pantoprazole (PROTONIX) 40 MG tablet Take 40 mg by mouth daily. Reported on 09/14/2015  0  . rosuvastatin (CRESTOR) 10 MG tablet Take 10 mg by mouth daily.  1  . simvastatin (ZOCOR) 20 MG tablet Take 20 mg by mouth daily. Reported on 01/15/2016    . tadalafil (CIALIS) 5 MG tablet Take 5 mg by mouth daily as needed for erectile dysfunction. Reported on 09/14/2015    . tamsulosin (FLOMAX) 0.4 MG CAPS capsule Take 0.4 mg by mouth daily.  0  . thiamine 100 MG tablet Take 1 tablet (100 mg total) by mouth daily. 30 tablet 0  . tiotropium (SPIRIVA) 18 MCG inhalation capsule Place 18 mcg into inhaler and inhale daily.    . traZODone (DESYREL) 150 MG tablet Take 150 mg by mouth at bedtime.     No current facility-administered medications for this encounter.    Facility-Administered Medications Ordered in Other Encounters  Medication Dose Route Frequency Provider Last Rate Last Dose  . ceFAZolin (ANCEF) IVPB 1 g/50 mL premix  1 g Intravenous 30 min Pre-Op Hollice Espy, MD      . sodium phosphate (FLEET) 7-19 GM/118ML enema 1 enema  1 enema Rectal Once Hollice Espy, MD        ECOG PERFORMANCE STATUS:  0 - Asymptomatic  REVIEW OF SYSTEMS:  Patient denies any weight loss, fatigue, weakness, fever, chills or night sweats. Patient denies any loss of vision, blurred vision. Patient denies any ringing  of the ears or hearing loss. No irregular heartbeat. Patient denies heart murmur or history of fainting. Patient denies any chest pain or pain radiating to her upper extremities. Patient denies any shortness of breath, difficulty breathing at night, cough or hemoptysis. Patient denies any swelling in the lower legs. Patient denies any nausea vomiting, vomiting of blood, or coffee ground material in the vomitus. Patient denies any stomach pain. Patient states has had normal bowel movements no significant constipation or diarrhea. Patient denies  any dysuria, hematuria or significant nocturia. Patient denies any problems walking, swelling in the joints or loss of balance. Patient denies any skin changes, loss of hair or loss of weight. Patient denies any excessive worrying or anxiety or significant depression. Patient denies any problems with insomnia. Patient denies excessive thirst, polyuria, polydipsia. Patient denies any swollen glands, patient denies easy bruising or easy bleeding. Patient denies any recent infections, allergies or URI. Patient "s visual fields have not changed significantly in recent time.    PHYSICAL EXAM: BP 137/79   Pulse 78   Temp (!) 96.6 F (35.9 C)   Resp 20   Wt 262 lb 3.8 oz (118.9 kg)   BMI 35.57 kg/m  Well-developed well-nourished patient in NAD. HEENT reveals PERLA, EOMI, discs not visualized.  Oral cavity is clear. No oral mucosal lesions are identified. Neck is clear without evidence of cervical or supraclavicular adenopathy. Lungs are clear to A&P. Cardiac examination is essentially unremarkable with regular rate and rhythm without murmur rub or thrill. Abdomen is benign with no organomegaly or masses noted. Motor sensory and DTR levels are equal  and symmetric in the upper and lower extremities. Cranial nerves II through XII are grossly intact. Proprioception is intact. No peripheral adenopathy or edema is identified. No motor or sensory levels are noted. Crude visual fields are within normal range.  LABORATORY DATA: Pathology reports reviewed    RADIOLOGY RESULTS: No current films for review   IMPRESSION: Stage I adenocarcinoma the prostate Gleason score 6 presenting with a PSA of 34.2 in 70 year old male  PLAN: At this time patient is been cleared by cardiology for his I-125 interstitial implant. An clear to stop his Plavix prior to the implant. Via study has been performed today and we will use BrachyVision treatment planning to decide positioning of seed implant as well as needles and number of  total seeds. Risks and benefits of treatment including increasing lower urinary tract symptoms possible diarrhea fatigue and side effects associated with general anesthesia all were reviewed were reviewed in detail with the patient. He seems to comprehend my treatment plan well.  I would like to take this opportunity to thank you for allowing me to participate in the care of your patient.Armstead Peaks., MD

## 2016-04-22 NOTE — Discharge Instructions (Signed)
AMBULATORY SURGERY  DISCHARGE INSTRUCTIONS   1) The drugs that you were given will stay in your system until tomorrow so for the next 24 hours you should not:  A) Drive an automobile B) Make any legal decisions C) Drink any alcoholic beverage   2) You may resume regular meals tomorrow.  Today it is better to start with liquids and gradually work up to solid foods.  You may eat anything you prefer, but it is better to start with liquids, then soup and crackers, and gradually work up to solid foods.   3) Please notify your doctor immediately if you have any unusual bleeding, trouble breathing, redness and pain at the surgery site, drainage, fever, or pain not relieved by medication.    4) Additional Instructions:        Please contact your physician with any problems or Same Day Surgery at [phone], Monday through Friday 6 am to 4 pm, or [place] at [facility] number at [phone].Brachytherapy for Prostate Cancer, Care After Refer to this sheet in the next few weeks. These instructions provide you with information on caring for yourself after your procedure. Your health care provider may also give you more specific instructions. Your treatment has been planned according to current medical practices, but problems sometimes occur. Call your health care provider if you have any problems or questions after your procedure. WHAT TO EXPECT AFTER THE PROCEDURE The area behind the scrotum will probably be tender and bruised. For a short period of time you may have:  Difficulty passing urine. You may need a catheter for a few days to a month.  Blood in the urine or semen.  A feeling of constipation because of prostate swelling.  Frequent feeling of an urgent need to urinate. For a long period of time you may have:  Inflammation of the rectum. This happens in about 2% of people who have the procedure.  Erection problems. These vary with age and occur in about 15-40% of  men.  Difficulty urinating. This is caused by scarring in the urethra.  Diarrhea. HOME CARE INSTRUCTIONS   Take medicines only as directed by your health care provider.  You will probably have a catheter in your bladder for several days. You will have blood in the urine bag and should drink a lot of fluids to keep it a light red color.  Keep all follow-up visits as directed by your health care provider. If you have a catheter, it will be removed during one of these visits.  Try not to sit directly on the area behind the scrotum. A soft cushion can decrease the discomfort. Ice packs may also be helpful for the discomfort. Do not put ice directly on the skin.  Shower and wash the area behind the scrotum gently. Do not sit in a tub.  If you have had the brachytherapy that uses the seeds, limit your close contact with children and pregnant women for 2 months because of the radiation still in the prostate. After that period of time, the levels drop off quickly. SEEK IMMEDIATE MEDICAL CARE IF:   You have a fever.  You have chills.  You have shortness of breath.  You have chest pain.  You have thick blood, like tomato juice, in the urine bag.  Your catheter is blocked so urine cannot get into the bag. Your bladder area or lower abdomen may be swollen.  There is excessive bleeding from your rectum. It is normal to have a little  blood mixed with your stool.  There is severe discomfort in the treated area that does not go away with pain medicine.  You have abdominal discomfort.  You have severe nausea or vomiting.  You develop any new or unusual symptoms.   This information is not intended to replace advice given to you by your health care provider. Make sure you discuss any questions you have with your health care provider.   Document Released: 08/16/2010 Document Revised: 08/04/2014 Document Reviewed: 01/04/2013 Elsevier Interactive Patient Education [employer].

## 2016-04-22 NOTE — Progress Notes (Signed)
Radiation Oncology Follow up Note  Name: Christopher Orr   Date:   04/22/2016 MRN:  [id number] DOB: 10/15/1945    This 70 y.o. male presents to the clinic today for I-125 interstitial implant.  REFERRING PROVIDER: Jodi Marble, MD  HPI: Patient is a 70 year old male who has been having a rising PSA over the past year 2. to approximate year ago up to 3.8 on 12/20/2015. This prompted transrectal ultrasound-guided biopsy showing 7 of 12 cores positive for Gleason 6 (3+3) adenocarcinoma in a 42 cc prostate. Most of disease was from the left lateral lobe. Patient has history of severe erectile dysfunction and some frequency and urgency of urination and occasional urge incontinence. He is currently on mybetriq. He's been seen by urology and is being considered for I-125 interstitial implant. He is otherwise doing well. Patient does have a history of cardiac stents and is currently on blood thinner medication. Patient is seen today for I-125 interstitial implant based on his previous volume study and treatment plan.  Procedure: patient was taken to the operating room and general anesthesia was administered. Legs were immobilized in stirrups and patient was positioned in the exact same proportions as original volume study. Patient was prepped and Foley catheter was placed. Ultrasound guidance identified the prostate and recreated the original set up as per treatment planning volume study. 22 needles were placed under ultrasound guidance with PVCs delivered to the prostate volume. After completion of procedure cystoscopy was performed by urology and no evidence of seeds in the bladder were noted. Patient tolerated the procedure extremely well. Initial plain film as doublecheck identified 93 seeds in the prostate. Patient has followup appointment in one month for CT scan for quality assurance will be performed.   COMPLICATIONS OF TREATMENT: none  FOLLOW UP COMPLIANCE: keeps appointments   RADIOLOGY  RESULTS: All seeds were counted and double checked on scout film after completion of procedure  PLAN: Present time patient will be seen 1 month for his follow-up appointment and CT scan for quality assurance. Patient has been given and instructed and radiation safety precautions as well as to contact us should any problems develop.  I would like to take this opportunity to thank you for allowing me to participate in the care of your patient.Armstead Peaks., MD

## 2016-04-22 NOTE — Anesthesia Preprocedure Evaluation (Signed)
Anesthesia Evaluation  Patient identified by MRN, date of birth, ID band Patient awake    Reviewed: Allergy & Precautions, NPO status , Patient's Chart, lab work & pertinent test results  Airway Mallampati: II       Dental  (+) Chipped, Partial Upper   Pulmonary pneumonia, resolved, COPD,  COPD inhaler, former smoker,    Pulmonary exam normal        Cardiovascular hypertension, Pt. on medications + CAD  Normal cardiovascular exam+ Valvular Problems/Murmurs MR      Neuro/Psych PSYCHIATRIC DISORDERS Anxiety Depression negative neurological ROS     GI/Hepatic GERD  Medicated,(+)     substance abuse  alcohol use and IV drug use, Hepatitis -, Cdiverticulosis   Endo/Other  negative endocrine ROS  Renal/GU Renal InsufficiencyRenal disease     Musculoskeletal negative musculoskeletal ROS (+)   Abdominal Normal abdominal exam  (+)   Peds  Hematology  (+) anemia ,   Anesthesia Other Findings   Reproductive/Obstetrics                             Anesthesia Physical Anesthesia Plan  ASA: III  Anesthesia Plan: General   Post-op Pain Management:    Induction: Intravenous  Airway Management Planned: Oral ETT  Additional Equipment:   Intra-op Plan:   Post-operative Plan: Extubation in OR  Informed Consent: I have reviewed the patients History and Physical, chart, labs and discussed the procedure including the risks, benefits and alternatives for the proposed anesthesia with the patient or authorized representative who has indicated his/her understanding and acceptance.   Dental advisory given  Plan Discussed with: CRNA and Surgeon  Anesthesia Plan Comments:         Anesthesia Quick Evaluation

## 2016-04-22 NOTE — Anesthesia Procedure Notes (Signed)
Procedure Name: Intubation Date/Time: 04/22/2016 7:54 AM Performed by: [facility] [facility] Pre-anesthesia Checklist: Patient identified, Emergency Drugs available, Suction available and Patient being monitored Patient Re-evaluated:Patient Re-evaluated prior to inductionOxygen Delivery Method: Circle system utilized Preoxygenation: Pre-oxygenation with 100% oxygen Intubation Type: IV induction Ventilation: Mask ventilation without difficulty Laryngoscope Size: Mac Grade View: Grade II Tube type: Oral Tube size: 7.0 mm Number of attempts: 1 Airway Equipment and Method: Stylet and Patient positioned with wedge pillow Placement Confirmation: ETT inserted through vocal cords under direct vision,  positive ETCO2,  CO2 detector and breath sounds checked- equal and bilateral Secured at: 21 cm Tube secured with: Tape Dental Injury: Teeth and Oropharynx as per pre-operative assessment

## 2016-04-23 NOTE — Anesthesia Postprocedure Evaluation (Signed)
Anesthesia Post Note  Patient: Christopher Orr  Procedure(s) Performed: Procedure(s) (LRB): RADIOACTIVE SEED IMPLANT/BRACHYTHERAPY IMPLANT (N/A)  Patient location during evaluation: PACU Anesthesia Type: General Level of consciousness: awake and alert and oriented Pain management: pain level controlled Vital Signs Assessment: post-procedure vital signs reviewed and stable Respiratory status: spontaneous breathing Cardiovascular status: blood pressure returned to baseline Anesthetic complications: no    Last Vitals:  Vitals:   04/22/16 1124 04/22/16 1141  BP:  124/74  Pulse: 70   Resp:  16  Temp:      Last Pain:  Vitals:   04/22/16 1106  TempSrc: Oral  PainSc: 0-No pain                 Karyna Bessler

## 2016-05-07 ENCOUNTER — Ambulatory Visit (INDEPENDENT_AMBULATORY_CARE_PROVIDER_SITE_OTHER): Payer: PPO | Admitting: Urology

## 2016-05-07 DIAGNOSIS — C61 Malignant neoplasm of prostate: Secondary | ICD-10-CM

## 2016-05-07 NOTE — Progress Notes (Signed)
Patient scheduled previously for visit today prior to recent brachytherapy (old f/u apt).  He is doing well since procedure and has no voiding complains therefore opted to reschedule for in 3 months.  Hollice Espy, MD

## 2016-05-13 DIAGNOSIS — B351 Tinea unguium: Secondary | ICD-10-CM | POA: Diagnosis not present

## 2016-05-13 DIAGNOSIS — M545 Low back pain: Secondary | ICD-10-CM | POA: Diagnosis not present

## 2016-05-13 DIAGNOSIS — I2510 Atherosclerotic heart disease of native coronary artery without angina pectoris: Secondary | ICD-10-CM | POA: Diagnosis not present

## 2016-05-13 DIAGNOSIS — I10 Essential (primary) hypertension: Secondary | ICD-10-CM | POA: Diagnosis not present

## 2016-05-13 DIAGNOSIS — E785 Hyperlipidemia, unspecified: Secondary | ICD-10-CM | POA: Diagnosis not present

## 2016-05-13 DIAGNOSIS — I803 Phlebitis and thrombophlebitis of lower extremities, unspecified: Secondary | ICD-10-CM | POA: Diagnosis not present

## 2016-05-13 DIAGNOSIS — N3941 Urge incontinence: Secondary | ICD-10-CM | POA: Diagnosis not present

## 2016-05-13 DIAGNOSIS — K210 Gastro-esophageal reflux disease with esophagitis: Secondary | ICD-10-CM | POA: Diagnosis not present

## 2016-05-13 DIAGNOSIS — C61 Malignant neoplasm of prostate: Secondary | ICD-10-CM | POA: Diagnosis not present

## 2016-05-13 DIAGNOSIS — K739 Chronic hepatitis, unspecified: Secondary | ICD-10-CM | POA: Diagnosis not present

## 2016-05-13 DIAGNOSIS — N400 Enlarged prostate without lower urinary tract symptoms: Secondary | ICD-10-CM | POA: Diagnosis not present

## 2016-05-13 DIAGNOSIS — J449 Chronic obstructive pulmonary disease, unspecified: Secondary | ICD-10-CM | POA: Diagnosis not present

## 2016-05-20 ENCOUNTER — Ambulatory Visit
Admission: RE | Admit: 2016-05-20 | Discharge: 2016-05-20 | Disposition: A | Discharge status: Home / Self Care | Payer: PPO | Source: Ambulatory Visit | Attending: Radiation Oncology | Admitting: Radiation Oncology

## 2016-05-20 ENCOUNTER — Other Ambulatory Visit: Payer: Self-pay | Admitting: *Deleted

## 2016-05-20 ENCOUNTER — Encounter: Payer: Self-pay | Admitting: Radiation Oncology

## 2016-05-20 VITALS — BP 116/78 | HR 87 | Temp 96.6°F | Wt 262.8 lb

## 2016-05-20 DIAGNOSIS — R350 Frequency of micturition: Secondary | ICD-10-CM | POA: Insufficient documentation

## 2016-05-20 DIAGNOSIS — Z510 Encounter for antineoplastic radiation therapy: Secondary | ICD-10-CM | POA: Diagnosis not present

## 2016-05-20 DIAGNOSIS — R351 Nocturia: Secondary | ICD-10-CM | POA: Insufficient documentation

## 2016-05-20 DIAGNOSIS — C61 Malignant neoplasm of prostate: Secondary | ICD-10-CM | POA: Insufficient documentation

## 2016-05-20 DIAGNOSIS — K5900 Constipation, unspecified: Secondary | ICD-10-CM | POA: Diagnosis not present

## 2016-05-20 NOTE — Progress Notes (Signed)
Radiation Oncology Follow up Note  Name: Christopher Orr   Date:   05/20/2016 MRN:  [id number] DOB: [birth year]    This 70 y.o. male presents to the clinic today for one-month follow-up status post I-125 interstitial implant for adenocarcinoma of the prostate.  REFERRING PROVIDER: Jodi Marble, MD  HPI: Patient is a 70 year old male now out 1 month having completed I-125 interstitial implant for Gleason 6 (3+3) adenocarcinoma. He is seen today in routine follow-up is doing well. He states he tends towards constipation and I suggested taking some mural asked for that. He states he has somewhat more frequency and urgency with nocturia 1..  COMPLICATIONS OF TREATMENT: none  FOLLOW UP COMPLIANCE: keeps appointments   PHYSICAL EXAM:  BP 116/78   Pulse 87   Temp (!) 96.6 F (35.9 C)   Wt 262 lb 12.6 oz (119.2 kg)   BMI 35.64 kg/m  On rectal exam rectal sphincter tone is good. Prostate is smooth contracted without evidence of nodularity or mass. Sulcus is preserved bilaterally. No discrete nodularity is identified. No other rectal abnormalities are noted. Well-developed well-nourished patient in NAD. HEENT reveals PERLA, EOMI, discs not visualized.  Oral cavity is clear. No oral mucosal lesions are identified. Neck is clear without evidence of cervical or supraclavicular adenopathy. Lungs are clear to A&P. Cardiac examination is essentially unremarkable with regular rate and rhythm without murmur rub or thrill. Abdomen is benign with no organomegaly or masses noted. Motor sensory and DTR levels are equal and symmetric in the upper and lower extremities. Cranial nerves II through XII are grossly intact. Proprioception is intact. No peripheral adenopathy or edema is identified. No motor or sensory levels are noted. Crude visual fields are within normal range.  RADIOLOGY RESULTS: CT scan for quality assurance was performed on his prostate seed implant  PLAN: Present time patient is doing  well. We are running quality assurance CT scan for his prostate implant. I'm please was overall progress. I've explained to him again our protocol for following his PSA and asked to see him back in about 4 months for follow-up at which time we will obtain a PSA. Patient knows to call with any concerns. His side effect profile is extremely minimal.  I would like to take this opportunity to thank you for allowing me to participate in the care of your patient.Armstead Peaks., MD

## 2016-05-21 DIAGNOSIS — C61 Malignant neoplasm of prostate: Secondary | ICD-10-CM | POA: Diagnosis not present

## 2016-07-02 DIAGNOSIS — E785 Hyperlipidemia, unspecified: Secondary | ICD-10-CM | POA: Diagnosis not present

## 2016-07-15 DIAGNOSIS — C61 Malignant neoplasm of prostate: Secondary | ICD-10-CM | POA: Diagnosis not present

## 2016-07-15 DIAGNOSIS — B351 Tinea unguium: Secondary | ICD-10-CM | POA: Diagnosis not present

## 2016-07-15 DIAGNOSIS — J449 Chronic obstructive pulmonary disease, unspecified: Secondary | ICD-10-CM | POA: Diagnosis not present

## 2016-07-15 DIAGNOSIS — N3941 Urge incontinence: Secondary | ICD-10-CM | POA: Diagnosis not present

## 2016-07-15 DIAGNOSIS — M545 Low back pain: Secondary | ICD-10-CM | POA: Diagnosis not present

## 2016-07-15 DIAGNOSIS — K739 Chronic hepatitis, unspecified: Secondary | ICD-10-CM | POA: Diagnosis not present

## 2016-07-15 DIAGNOSIS — N400 Enlarged prostate without lower urinary tract symptoms: Secondary | ICD-10-CM | POA: Diagnosis not present

## 2016-07-15 DIAGNOSIS — I10 Essential (primary) hypertension: Secondary | ICD-10-CM | POA: Diagnosis not present

## 2016-07-15 DIAGNOSIS — K210 Gastro-esophageal reflux disease with esophagitis: Secondary | ICD-10-CM | POA: Diagnosis not present

## 2016-07-15 DIAGNOSIS — I2510 Atherosclerotic heart disease of native coronary artery without angina pectoris: Secondary | ICD-10-CM | POA: Diagnosis not present

## 2016-07-15 DIAGNOSIS — M13869 Other specified arthritis, unspecified knee: Secondary | ICD-10-CM | POA: Diagnosis not present

## 2016-07-15 DIAGNOSIS — E785 Hyperlipidemia, unspecified: Secondary | ICD-10-CM | POA: Diagnosis not present

## 2016-08-08 ENCOUNTER — Encounter: Payer: Self-pay | Admitting: Urology

## 2016-08-08 ENCOUNTER — Ambulatory Visit: Payer: PPO | Admitting: Urology

## 2016-08-08 VITALS — BP 120/74 | HR 85 | Ht 72.0 in | Wt 269.0 lb

## 2016-08-08 DIAGNOSIS — C61 Malignant neoplasm of prostate: Secondary | ICD-10-CM

## 2016-08-08 DIAGNOSIS — R32 Unspecified urinary incontinence: Secondary | ICD-10-CM

## 2016-08-08 DIAGNOSIS — N528 Other male erectile dysfunction: Secondary | ICD-10-CM

## 2016-08-08 LAB — MICROSCOPIC EXAMINATION

## 2016-08-08 LAB — URINALYSIS, COMPLETE
Bilirubin, UA: NEGATIVE
GLUCOSE, UA: NEGATIVE
LEUKOCYTES UA: NEGATIVE
Nitrite, UA: NEGATIVE
SPEC GRAV UA: 1.025 (ref 1.005–1.030)
Urobilinogen, Ur: 1 mg/dL (ref 0.2–1.0)
pH, UA: 6 (ref 5.0–7.5)

## 2016-08-08 LAB — BLADDER SCAN AMB NON-IMAGING

## 2016-08-08 MED ORDER — IBUPROFEN 200 MG PO TABS: 600.0000 mg | ORAL_TABLET | Freq: Three times a day (TID) | ORAL | 0 refills | Status: DC

## 2016-08-08 NOTE — Progress Notes (Signed)
08/08/2016 11:29 AM   Christopher Orr [birth year] [id number]  Referring provider: Jodi Marble, MD [address] [place], [place] [address]  Chief Complaint  Patient presents with  . Prostate Cancer    5month   HPI: 71year old male with low risk Gleason 3+3 prostate cancer s/p brachytherapy seed implant 04/22/16 with I-125 seeds.    He was dx after rise in his PSA up to 3.8 on 12/20/15 up from 3.2 on 09/24/15, and 2.2 the previous year s/p prostate biopsy on 01/15/16 demonstrating prostate cancer, T1c, Gleason 3+3 involving 7/12 cores including all cores on left and single core at right apex up to 70% tissue.  TRUS volume 42 cc.     He does also have baseline lower urinary tract symptoms including urinary urgency and urge incontinence. He is currently on Mybetriq 50 mg and flomax 0.4 mg.   Today, he complains of significantly worsened urinary symptoms.  His primary complaint is concerning worsened frequency and urgency. He also reports that following urination, he will leak or dribble urine and has difficulty with occasional urge incontinence. This is very bothersome to him. He also gets up several times a night to void. Overall, he is dissatisfied with his symptoms.  No gross hematuria or dysuria.    PVR today minimal.   Baseline severe ED, unable to achieve or maintain an erection.        IPSS    Row Name 08/08/16 1100         International Prostate Symptom Score   How often have you had the sensation of not emptying your bladder? About half the time     How often have you had to urinate less than every two hours? More than half the time     How often have you found you stopped and started again several times when you urinated? Less than half the time     How often have you found it difficult to postpone urination? More than half the time     How often have you had a weak urinary stream? Almost always     How often have you had to strain to start urination? Not at All      How many times did you typically get up at night to urinate? 3 Times     Total IPSS Score 21       Quality of Life due to urinary symptoms   If you were to spend the rest of your life with your urinary condition just the way it is now how would you feel about that? Mostly Disatisfied         PMH: Past Medical History:  Diagnosis Date  . Alcohol abuse   . Anemia   . BPH (benign prostatic hypertrophy) with urinary obstruction   . CAD (coronary artery disease)    [facility].  . Cancer ([facility]    prostate  . [place] [facility]itis ([facility]   . COPD (chronic obstructive pulmonary disease) ([facility]   . Depression   . Diverticulosis   . Drug use   . ED (erectile dysfunction)   . GERD (gastroesophageal reflux disease)   . Hepatitis C   . History of lumbar fusion   . Hyperlipidemia   . Hypertension   . Mitral regurgitation   . Urge incontinence     Surgical History: Past Surgical History:  Procedure Laterality Date  . ESOPHAGOGASTRODUODENOSCOPY N/A 12/22/2014  Procedure: ESOPHAGOGASTRODUODENOSCOPY (EGD);  Surgeon: [name], MD;  Location: [facility] ENDOSCOPY;  Service: Endoscopy;  Laterality: N/A;  . LUMBAR FUSION    . RADIOACTIVE SEED IMPLANT N/A 04/22/2016   Procedure: RADIOACTIVE SEED IMPLANT/BRACHYTHERAPY IMPLANT;  Surgeon: Hollice Espy, MD;  Location: [facility] ORS;  Service: Urology;  Laterality: N/A;  . TONSILLECTOMY      Home Medications:  Allergies as of 08/08/2016   No Known Allergies     Medication List       Accurate as of 08/08/16 11:29 AM. Always use your most recent med list.          albuterol (2.5 MG/3ML) 0.083% nebulizer solution Commonly known as:  PROVENTIL USE TWICE A DAY PER NEBULIZER   ARIPiprazole 10 MG tablet Commonly known as:  ABILIFY Take 10 mg by mouth daily.   aspirin 81 MG tablet Take 81 mg by mouth daily. Reported on 01/15/2016   celecoxib 400 MG capsule Commonly known as:   CELEBREX Take 400 mg by mouth daily after breakfast.   clopidogrel 75 MG tablet Commonly known as:  PLAVIX Take 75 mg by mouth daily. Reported on 01/15/2016   diazepam 10 MG tablet Commonly known as:  VALIUM   docusate sodium 100 MG capsule Commonly known as:  COLACE Take 1 capsule (100 mg total) by mouth 2 (two) times daily.   FLUoxetine HCl 60 MG Tabs Take 1 tablet by mouth daily.   folic acid 1 MG tablet Commonly known as:  FOLVITE Take 1 mg by mouth daily.   ibuprofen 200 MG tablet Commonly known as:  MOTRIN IB Take 3 tablets (600 mg total) by mouth 3 (three) times daily.   multivitamin with minerals tablet Take 1 tablet by mouth daily.   MYRBETRIQ 50 MG Tb24 tablet Generic drug:  mirabegron ER TAKE 1 TABLET (50 MG TOTAL) BY MOUTH DAILY.   pantoprazole 40 MG tablet Commonly known as:  PROTONIX Take 40 mg by mouth daily. Reported on 09/14/2015   rosuvastatin 10 MG tablet Commonly known as:  CRESTOR Take 10 mg by mouth daily.   tamsulosin 0.4 MG Caps capsule Commonly known as:  FLOMAX Take 0.4 mg by mouth daily.   tiZANidine 4 MG capsule Commonly known as:  ZANAFLEX Take 4 mg by mouth 3 (three) times daily.   traZODone 150 MG tablet Commonly known as:  DESYREL Take 150 mg by mouth at bedtime.       Allergies: No Known Allergies  Family History: Family History  Problem Relation Age of Onset  . Heart disease Father     Unclear details. Father passed in late 44's 2/2 cancer.  . Colon cancer Father   . Kidney disease Neg Hx   . Prostate cancer Neg Hx     Social History:  reports that he quit smoking about 4 years ago. His smoking use included Cigarettes. He started smoking about 54 years ago. He smoked 1.50 packs per day. He has never used smokeless tobacco. He reports that he drinks about 8.4 oz of alcohol per week . He reports that he uses drugs, including Marijuana.  ROS: UROLOGY Frequent Urination?: No Hard to postpone urination?:  Yes Burning/pain with urination?: No Get up at night to urinate?: Yes Leakage of urine?: Yes Urine stream starts and stops?: No Trouble starting stream?: No Do you have to strain to urinate?: No Blood in urine?: No Urinary tract infection?: No Sexually transmitted disease?: No Injury to kidneys or bladder?: No Painful intercourse?: No Weak stream?:  Yes Erection problems?: Yes Penile pain?: No  Gastrointestinal Nausea?: No Vomiting?: No Indigestion/heartburn?: Yes Diarrhea?: No Constipation?: No  Constitutional Fever: No Night sweats?: No Weight loss?: No Fatigue?: Yes  Skin Skin rash/lesions?: No Itching?: No  Eyes Blurred vision?: No Double vision?: No  Ears/Nose/Throat Sore throat?: No Sinus problems?: Yes  Hematologic/Lymphatic Swollen glands?: No Easy bruising?: Yes  Cardiovascular Leg swelling?: No Chest pain?: No  Respiratory Cough?: No Shortness of breath?: Yes  Endocrine Excessive thirst?: No  Musculoskeletal Back pain?: Yes Joint pain?: No  Neurological Headaches?: No Dizziness?: No  Psychologic Depression?: Yes Anxiety?: No  Physical Exam: BP 120/74   Pulse 85   Ht 6' (1.829 m)   Wt 269 lb (122 kg)   BMI 36.48 kg/m   Constitutional:  Alert and oriented, No acute distress. HEENT: [place] AT, moist mucus membranes.  Trachea midline, no masses. Cardiovascular: No clubbing, cyanosis, or edema. Respiratory: Normal respiratory effort, no increased work of breathing. GI: Abdomen is soft, nontender, nondistended, no abdominal masses.   Skin: No rashes, bruises or suspicious lesions. Neurologic: Grossly intact, no focal deficits, moving all 4 extremities. Psychiatric: Normal mood and affect.  Laboratory Data: Lab Results  Component Value Date   WBC 7.8 04/08/2016   HGB 13.4 04/08/2016   HCT 40.3 04/08/2016   MCV 98.8 04/08/2016   PLT 260 04/08/2016    Lab Results  Component Value Date   CREATININE 1.10 11/08/2015   PSA as  above  Results for orders placed or performed in visit on 08/08/16  BLADDER SCAN AMB NON-IMAGING  Result Value Ref Range   Scan Result 24m     Assessment & Plan:    1. Prostate cancer ([facility] cT1c prostate cancer, PSA 3.8, Gleason 3+3 in 7/12 cores, relatively high volume LOW RISK diease S/p I-125 brachytherapy 03/2016 PSA pending Followed by Dr. CBaruch Gouty has upcomming apt   2. Urge incontinence Continue Mybetriq 50 mg Worsened urinary symptoms following radiation Will try short course NSAIDS UA/ UCx today to r/o infection as contributing cause  Discussed that acute worsening of symptoms is common, will likely improve over the next few months He is already on maximal medical therapy, may consider Botox/ PTNS in the future if his symptoms fail to improve  3. Erectile dysfunction, unspecified erectile dysfunction type Severe, no treatment disired   Return in about 3 months (around 11/06/2016) for reassess symptoms, PVR, IPSS.  AHollice Espy MD  [facility] [address] [place] [phone]

## 2016-08-11 LAB — CULTURE, URINE COMPREHENSIVE

## 2016-09-27 ENCOUNTER — Other Ambulatory Visit: Payer: Self-pay | Admitting: Urology

## 2016-10-02 ENCOUNTER — Inpatient Hospital Stay: Payer: PPO | Attending: Radiation Oncology

## 2016-10-02 DIAGNOSIS — C61 Malignant neoplasm of prostate: Secondary | ICD-10-CM | POA: Insufficient documentation

## 2016-10-02 LAB — PSA: PSA: 1.74 ng/mL (ref 0.00–4.00)

## 2016-10-09 ENCOUNTER — Ambulatory Visit
Admission: RE | Admit: 2016-10-09 | Discharge: 2016-10-09 | Disposition: A | Discharge status: Home / Self Care | Payer: PPO | Source: Ambulatory Visit | Attending: Radiation Oncology | Admitting: Radiation Oncology

## 2016-10-09 ENCOUNTER — Encounter: Payer: Self-pay | Admitting: Radiation Oncology

## 2016-10-09 ENCOUNTER — Other Ambulatory Visit: Payer: Self-pay | Admitting: *Deleted

## 2016-10-09 VITALS — BP 130/75 | HR 86 | Temp 97.7°F | Resp 20 | Wt 267.9 lb

## 2016-10-09 DIAGNOSIS — C61 Malignant neoplasm of prostate: Secondary | ICD-10-CM

## 2016-10-09 DIAGNOSIS — Z923 Personal history of irradiation: Secondary | ICD-10-CM | POA: Diagnosis not present

## 2016-10-09 MED ORDER — TAMSULOSIN HCL 0.4 MG PO CAPS: 0.4000 mg | ORAL_CAPSULE | Freq: Every day | ORAL | 0 refills | Status: DC

## 2016-10-09 MED ORDER — TAMSULOSIN HCL 0.4 MG PO CAPS: 0.4000 mg | ORAL_CAPSULE | Freq: Every day | ORAL | 12 refills | Status: DC

## 2016-10-09 NOTE — Progress Notes (Signed)
Radiation Oncology Follow up Note  Name: Christopher Orr   Date:   10/09/2016 MRN:  [id number] DOB: Jan 16, 1946    This 71 y.o. male presents to the clinic today for 5 month follow-up status post I-125 interstitial implant for prostate cancer.  REFERRING PROVIDER: Jodi Marble, MD  HPI: Patient is a 71 year old male now out 5 months have included I-125 interstitial implant for Gleason 6 (3+3) adenocarcinoma the prostate presenting the PSA of 3.8 seen today in routine follow-up he is doing well. Specifically denies diarrhea dysuria or any other GI/GU complaints. His last PSA March 8 was 1.7..  COMPLICATIONS OF TREATMENT: none  FOLLOW UP COMPLIANCE: keeps appointments   PHYSICAL EXAM:  BP 130/75   Pulse 86   Temp 97.7 F (36.5 C)   Resp 20   Wt 267 lb 13.7 oz (121.5 kg)   BMI 36.33 kg/m  On rectal exam rectal sphincter tone is good. Prostate is smooth contracted without evidence of nodularity or mass. Sulcus is preserved bilaterally. No discrete nodularity is identified. No other rectal abnormalities are noted. Well-developed well-nourished patient in NAD. HEENT reveals PERLA, EOMI, discs not visualized.  Oral cavity is clear. No oral mucosal lesions are identified. Neck is clear without evidence of cervical or supraclavicular adenopathy. Lungs are clear to A&P. Cardiac examination is essentially unremarkable with regular rate and rhythm without murmur rub or thrill. Abdomen is benign with no organomegaly or masses noted. Motor sensory and DTR levels are equal and symmetric in the upper and lower extremities. Cranial nerves II through XII are grossly intact. Proprioception is intact. No peripheral adenopathy or edema is identified. No motor or sensory levels are noted. Crude visual fields are within normal range.  RADIOLOGY RESULTS: No current films for review  PLAN: Present time patient is doing well. PSA is normalizing at this point. He is having very little side effects or  complaints. I've asked to see him back in 6 months for follow-up and will obtain a PSA prior to that visit. Patient knows to call's before that visit.  I would like to take this opportunity to thank you for allowing me to participate in the care of your patient.Armstead Peaks., MD

## 2016-10-22 DIAGNOSIS — E785 Hyperlipidemia, unspecified: Secondary | ICD-10-CM | POA: Diagnosis not present

## 2016-11-03 DIAGNOSIS — N400 Enlarged prostate without lower urinary tract symptoms: Secondary | ICD-10-CM | POA: Diagnosis not present

## 2016-11-03 DIAGNOSIS — E785 Hyperlipidemia, unspecified: Secondary | ICD-10-CM | POA: Diagnosis not present

## 2016-11-03 DIAGNOSIS — F418 Other specified anxiety disorders: Secondary | ICD-10-CM | POA: Diagnosis not present

## 2016-11-03 DIAGNOSIS — I10 Essential (primary) hypertension: Secondary | ICD-10-CM | POA: Diagnosis not present

## 2016-11-03 DIAGNOSIS — N189 Chronic kidney disease, unspecified: Secondary | ICD-10-CM | POA: Diagnosis not present

## 2016-11-03 DIAGNOSIS — M13869 Other specified arthritis, unspecified knee: Secondary | ICD-10-CM | POA: Diagnosis not present

## 2016-11-03 DIAGNOSIS — M545 Low back pain: Secondary | ICD-10-CM | POA: Diagnosis not present

## 2016-11-03 DIAGNOSIS — K739 Chronic hepatitis, unspecified: Secondary | ICD-10-CM | POA: Diagnosis not present

## 2016-11-03 DIAGNOSIS — J449 Chronic obstructive pulmonary disease, unspecified: Secondary | ICD-10-CM | POA: Diagnosis not present

## 2016-11-03 DIAGNOSIS — K210 Gastro-esophageal reflux disease with esophagitis: Secondary | ICD-10-CM | POA: Diagnosis not present

## 2016-11-03 DIAGNOSIS — N3941 Urge incontinence: Secondary | ICD-10-CM | POA: Diagnosis not present

## 2016-11-03 DIAGNOSIS — C61 Malignant neoplasm of prostate: Secondary | ICD-10-CM | POA: Diagnosis not present

## 2016-11-07 ENCOUNTER — Encounter: Payer: Self-pay | Admitting: Urology

## 2016-11-07 ENCOUNTER — Ambulatory Visit (INDEPENDENT_AMBULATORY_CARE_PROVIDER_SITE_OTHER): Payer: PPO | Admitting: Urology

## 2016-11-07 VITALS — BP 108/70 | HR 80 | Ht 72.0 in | Wt 270.0 lb

## 2016-11-07 DIAGNOSIS — N3941 Urge incontinence: Secondary | ICD-10-CM | POA: Diagnosis not present

## 2016-11-07 LAB — BLADDER SCAN AMB NON-IMAGING

## 2016-11-07 NOTE — Progress Notes (Signed)
11/07/2016 1:11 PM   Christopher Orr 26-Oct-1945 [id number]  Referring provider: Jodi Marble, MD [place], [place] [address]  Chief Complaint  Patient presents with  . Urinary Incontinence    88month   HPI: 71year old male with history of prostate cancer, BPH with severe LUTS, and ED.  He was dx with low risk Gleason 3+3 prostate cancer s/p brachytherapy seed implant 04/22/16 with I-125 seeds.    He was dx after rise in his PSA up to 3.8 on 12/20/15 up from 3.2 on 09/24/15, and 2.2 the previous year s/p prostate biopsy on 01/15/16 demonstrating prostate cancer, T1c, Gleason 3+3 involving 7/12 cores including all cores on left and single core at right apex up to 70% tissue.  TRUS volume 42 cc.    He does also have baseline lower urinary tract symptoms including urinary urgency and urge incontinence. He is currently on Mybetriq 50 mg and flomax 0.4 mg.     Today, his urinary symptoms have returned back to baseline following brachytherapy. He continues to struggle with urinary frequency and urgency with urge incontinence. He continues to take Mybetriq 50 mg daily.  No gross hematuria or dysuria.    Baseline severe ED, unable to achieve or maintain an erection.        IPSS    Row Name 11/07/16 1100         International Prostate Symptom Score   How often have you had the sensation of not emptying your bladder? Less than 1 in 5     How often have you had to urinate less than every two hours? Not at All     How often have you found you stopped and started again several times when you urinated? Not at All     How often have you found it difficult to postpone urination? Less than half the time     How often have you had a weak urinary stream? Less than half the time     How often have you had to strain to start urination? Not at All     How many times did you typically get up at night to urinate? 2 Times     Total IPSS Score 7       Quality of Life due to  urinary symptoms   If you were to spend the rest of your life with your urinary condition just the way it is now how would you feel about that? Mixed        Score:  1-7 Mild 8-19 Moderate 20-35 Severe   PMH: Past Medical History:  Diagnosis Date  . Alcohol abuse   . Anemia   . BPH (benign prostatic hypertrophy) with urinary obstruction   . CAD (coronary artery disease)    [facility].  . Cancer ([facility]    prostate  . [place] ([facility] [place]   . COPD (chronic obstructive pulmonary disease) ([facility]   . Depression   . Diverticulosis   . Drug use   . ED (erectile dysfunction)   . GERD (gastroesophageal reflux disease)   . Hepatitis C   . History of lumbar fusion   . Hyperlipidemia   . Hypertension   . Mitral regurgitation   . Urge incontinence     Surgical History: Past Surgical History:  Procedure Laterality Date  . ESOPHAGOGASTRODUODENOSCOPY N/A 12/22/2014   Procedure: ESOPHAGOGASTRODUODENOSCOPY (EGD);  Surgeon: MJosefine Class  MD;  Location: [facility] ENDOSCOPY;  Service: Endoscopy;  Laterality: N/A;  . LUMBAR FUSION    . RADIOACTIVE SEED IMPLANT N/A 04/22/2016   Procedure: RADIOACTIVE SEED IMPLANT/BRACHYTHERAPY IMPLANT;  Surgeon: Hollice Espy, MD;  Location: [facility] ORS;  Service: Urology;  Laterality: N/A;  . TONSILLECTOMY      Home Medications:  Allergies as of 11/07/2016   No Known Allergies     Medication List       Accurate as of 11/07/16 11:59 PM. Always use your most recent med list.          albuterol (2.5 MG/3ML) 0.083% nebulizer solution Commonly known as:  PROVENTIL USE TWICE A DAY PER NEBULIZER   ARIPiprazole 10 MG tablet Commonly known as:  ABILIFY Take 10 mg by mouth daily.   aspirin 81 MG tablet Take 81 mg by mouth daily. Reported on 01/15/2016   celecoxib 400 MG capsule Commonly known as:  CELEBREX Take 400 mg by mouth daily after breakfast.   clopidogrel 75 MG tablet Commonly known as:   PLAVIX Take 75 mg by mouth daily. Reported on 01/15/2016   diazepam 10 MG tablet Commonly known as:  VALIUM   docusate sodium 100 MG capsule Commonly known as:  COLACE Take 1 capsule (100 mg total) by mouth 2 (two) times daily.   FLUoxetine HCl 60 MG Tabs Take 1 tablet by mouth daily.   folic acid 1 MG tablet Commonly known as:  FOLVITE Take 1 mg by mouth daily.   multivitamin with minerals tablet Take 1 tablet by mouth daily.   MYRBETRIQ 50 MG Tb24 tablet Generic drug:  mirabegron ER TAKE 1 TABLET (50 MG TOTAL) BY MOUTH DAILY.   pantoprazole 40 MG tablet Commonly known as:  PROTONIX Take 40 mg by mouth daily. Reported on 09/14/2015   rosuvastatin 10 MG tablet Commonly known as:  CRESTOR Take 10 mg by mouth daily.   tamsulosin 0.4 MG Caps capsule Commonly known as:  FLOMAX Take 1 capsule (0.4 mg total) by mouth daily after supper.   tamsulosin 0.4 MG Caps capsule Commonly known as:  FLOMAX Take 1 capsule (0.4 mg total) by mouth daily.   tiZANidine 4 MG capsule Commonly known as:  ZANAFLEX Take 4 mg by mouth 3 (three) times daily.   traZODone 150 MG tablet Commonly known as:  DESYREL Take 150 mg by mouth at bedtime.       Allergies: No Known Allergies  Family History: Family History  Problem Relation Age of Onset  . Heart disease Father     Unclear details. Father passed in late 58's 2/2 cancer.  . Colon cancer Father   . Kidney disease Neg Hx   . Prostate cancer Neg Hx     Social History:  reports that he quit smoking about 4 years ago. His smoking use included Cigarettes. He started smoking about 54 years ago. He smoked 1.50 packs per day. He has never used smokeless tobacco. He reports that he drinks about 8.4 oz of alcohol per week . He reports that he uses drugs, including Marijuana.  ROS: UROLOGY Frequent Urination?: No Hard to postpone urination?: Yes Burning/pain with urination?: No Get up at night to urinate?: Yes Leakage of urine?:  Yes Urine stream starts and stops?: No Trouble starting stream?: No Do you have to strain to urinate?: No Blood in urine?: No Urinary tract infection?: No Sexually transmitted disease?: No Injury to kidneys or bladder?: No Painful intercourse?: No Weak stream?: Yes Erection problems?: Yes Penile pain?:  No  Gastrointestinal Nausea?: No Vomiting?: No Indigestion/heartburn?: Yes Diarrhea?: No Constipation?: No  Constitutional Fever: No Night sweats?: No Weight loss?: No Fatigue?: Yes  Skin Skin rash/lesions?: No Itching?: No  Eyes Blurred vision?: No Double vision?: No  Ears/Nose/Throat Sore throat?: No Sinus problems?: Yes  Hematologic/Lymphatic Swollen glands?: No Easy bruising?: Yes  Cardiovascular Leg swelling?: No Chest pain?: No  Respiratory Cough?: No Shortness of breath?: Yes  Endocrine Excessive thirst?: No  Musculoskeletal Back pain?: Yes Joint pain?: No  Neurological Headaches?: No Dizziness?: No  Psychologic Depression?: Yes Anxiety?: No  Physical Exam: BP 108/70   Pulse 80   Ht 6' (1.829 m)   Wt 270 lb (122.5 kg)   BMI 36.62 kg/m   Constitutional:  Alert and oriented, No acute distress. HEENT: [place] AT, moist mucus membranes.  Trachea midline, no masses. Cardiovascular: No clubbing, cyanosis, or edema. Respiratory: Normal respiratory effort, no increased work of breathing. GI: Abdomen is soft, nontender, nondistended, no abdominal masses.   Skin: No rashes, bruises or suspicious lesions. Neurologic: Grossly intact, no focal deficits, moving all 4 extremities. Psychiatric: Normal mood and affect.  Laboratory Data: Lab Results  Component Value Date   WBC 7.8 04/08/2016   HGB 13.4 04/08/2016   HCT 40.3 04/08/2016   MCV 98.8 04/08/2016   PLT 260 04/08/2016    Lab Results  Component Value Date   CREATININE 1.10 11/08/2015   PSA as above  Results for orders placed or performed in visit on 11/07/16  BLADDER SCAN AMB  NON-IMAGING  Result Value Ref Range   Scan Result 47m     Assessment & Plan:    1. Prostate cancer ([facility] cT1c prostate cancer, PSA 3.8, Gleason 3+3 in 7/12 cores, relatively high volume LOW RISK diease S/p I-125 brachytherapy 03/2016 New baseline PSA 1.74, will continue to follow PSA q6 months   2. Urge incontinence Continue Mybetriq 50 mg Currently satisfied with voiding, does not desire further intervention as this time  3. Erectile dysfunction, unspecified erectile dysfunction type Severe, no treatment disired   Return in about 6 months (around 05/09/2017) for PSA, PVR, IPSS.  AHollice Espy MD  [facility] [address] [place] [id number]([phone]

## 2016-11-12 DIAGNOSIS — N189 Chronic kidney disease, unspecified: Secondary | ICD-10-CM | POA: Diagnosis not present

## 2016-11-24 DIAGNOSIS — N3941 Urge incontinence: Secondary | ICD-10-CM | POA: Diagnosis not present

## 2016-11-24 DIAGNOSIS — C61 Malignant neoplasm of prostate: Secondary | ICD-10-CM | POA: Diagnosis not present

## 2016-11-24 DIAGNOSIS — N189 Chronic kidney disease, unspecified: Secondary | ICD-10-CM | POA: Diagnosis not present

## 2016-11-24 DIAGNOSIS — I10 Essential (primary) hypertension: Secondary | ICD-10-CM | POA: Diagnosis not present

## 2016-11-24 DIAGNOSIS — M13869 Other specified arthritis, unspecified knee: Secondary | ICD-10-CM | POA: Diagnosis not present

## 2016-11-24 DIAGNOSIS — M545 Low back pain: Secondary | ICD-10-CM | POA: Diagnosis not present

## 2016-11-24 DIAGNOSIS — N400 Enlarged prostate without lower urinary tract symptoms: Secondary | ICD-10-CM | POA: Diagnosis not present

## 2016-11-24 DIAGNOSIS — E785 Hyperlipidemia, unspecified: Secondary | ICD-10-CM | POA: Diagnosis not present

## 2016-11-24 DIAGNOSIS — F418 Other specified anxiety disorders: Secondary | ICD-10-CM | POA: Diagnosis not present

## 2016-11-24 DIAGNOSIS — I2510 Atherosclerotic heart disease of native coronary artery without angina pectoris: Secondary | ICD-10-CM | POA: Diagnosis not present

## 2016-11-24 DIAGNOSIS — J449 Chronic obstructive pulmonary disease, unspecified: Secondary | ICD-10-CM | POA: Diagnosis not present

## 2016-11-24 DIAGNOSIS — K210 Gastro-esophageal reflux disease with esophagitis: Secondary | ICD-10-CM | POA: Diagnosis not present

## 2016-12-19 ENCOUNTER — Ambulatory Visit: Payer: PPO | Admitting: Urology

## 2017-02-02 DIAGNOSIS — I10 Essential (primary) hypertension: Secondary | ICD-10-CM | POA: Diagnosis not present

## 2017-02-02 DIAGNOSIS — I2510 Atherosclerotic heart disease of native coronary artery without angina pectoris: Secondary | ICD-10-CM | POA: Diagnosis not present

## 2017-02-02 DIAGNOSIS — I340 Nonrheumatic mitral (valve) insufficiency: Secondary | ICD-10-CM | POA: Diagnosis not present

## 2017-02-03 DIAGNOSIS — I2510 Atherosclerotic heart disease of native coronary artery without angina pectoris: Secondary | ICD-10-CM | POA: Diagnosis not present

## 2017-02-03 DIAGNOSIS — I10 Essential (primary) hypertension: Secondary | ICD-10-CM | POA: Diagnosis not present

## 2017-02-03 DIAGNOSIS — I340 Nonrheumatic mitral (valve) insufficiency: Secondary | ICD-10-CM | POA: Diagnosis not present

## 2017-02-12 DIAGNOSIS — Z961 Presence of intraocular lens: Secondary | ICD-10-CM | POA: Diagnosis not present

## 2017-02-27 DIAGNOSIS — I10 Essential (primary) hypertension: Secondary | ICD-10-CM | POA: Diagnosis not present

## 2017-02-27 DIAGNOSIS — E785 Hyperlipidemia, unspecified: Secondary | ICD-10-CM | POA: Diagnosis not present

## 2017-02-27 DIAGNOSIS — I2510 Atherosclerotic heart disease of native coronary artery without angina pectoris: Secondary | ICD-10-CM | POA: Diagnosis not present

## 2017-02-27 DIAGNOSIS — I739 Peripheral vascular disease, unspecified: Secondary | ICD-10-CM | POA: Diagnosis not present

## 2017-03-04 DIAGNOSIS — I2510 Atherosclerotic heart disease of native coronary artery without angina pectoris: Secondary | ICD-10-CM | POA: Diagnosis not present

## 2017-03-04 DIAGNOSIS — E785 Hyperlipidemia, unspecified: Secondary | ICD-10-CM | POA: Diagnosis not present

## 2017-03-04 DIAGNOSIS — I10 Essential (primary) hypertension: Secondary | ICD-10-CM | POA: Diagnosis not present

## 2017-03-04 DIAGNOSIS — I739 Peripheral vascular disease, unspecified: Secondary | ICD-10-CM | POA: Diagnosis not present

## 2017-04-02 ENCOUNTER — Inpatient Hospital Stay: Payer: PPO | Attending: Radiation Oncology

## 2017-04-02 DIAGNOSIS — C61 Malignant neoplasm of prostate: Secondary | ICD-10-CM | POA: Insufficient documentation

## 2017-04-02 LAB — PSA: Prostatic Specific Antigen: 0.88 ng/mL (ref 0.00–4.00)

## 2017-04-07 DIAGNOSIS — R5382 Chronic fatigue, unspecified: Secondary | ICD-10-CM | POA: Diagnosis not present

## 2017-04-07 DIAGNOSIS — E785 Hyperlipidemia, unspecified: Secondary | ICD-10-CM | POA: Diagnosis not present

## 2017-04-08 DIAGNOSIS — E785 Hyperlipidemia, unspecified: Secondary | ICD-10-CM | POA: Diagnosis not present

## 2017-04-08 DIAGNOSIS — R0602 Shortness of breath: Secondary | ICD-10-CM | POA: Diagnosis not present

## 2017-04-08 DIAGNOSIS — R5382 Chronic fatigue, unspecified: Secondary | ICD-10-CM | POA: Diagnosis not present

## 2017-04-08 DIAGNOSIS — I2510 Atherosclerotic heart disease of native coronary artery without angina pectoris: Secondary | ICD-10-CM | POA: Diagnosis not present

## 2017-04-09 ENCOUNTER — Ambulatory Visit
Admission: RE | Admit: 2017-04-09 | Discharge: 2017-04-09 | Disposition: A | Discharge status: Home / Self Care | Payer: PPO | Source: Ambulatory Visit | Attending: Radiation Oncology | Admitting: Radiation Oncology

## 2017-04-09 ENCOUNTER — Other Ambulatory Visit: Payer: Self-pay | Admitting: *Deleted

## 2017-04-09 ENCOUNTER — Encounter: Payer: Self-pay | Admitting: Radiation Oncology

## 2017-04-09 VITALS — BP 132/84 | HR 77 | Temp 97.2°F | Resp 20 | Wt 258.9 lb

## 2017-04-09 DIAGNOSIS — R32 Unspecified urinary incontinence: Secondary | ICD-10-CM | POA: Diagnosis not present

## 2017-04-09 DIAGNOSIS — Z923 Personal history of irradiation: Secondary | ICD-10-CM | POA: Insufficient documentation

## 2017-04-09 DIAGNOSIS — C61 Malignant neoplasm of prostate: Secondary | ICD-10-CM

## 2017-04-09 NOTE — Progress Notes (Signed)
Radiation Oncology Follow up Note  Name: Christopher Orr   Date:   04/09/2017 MRN:  [id number] DOB: 1945-11-26    This 71 y.o. male presents to the clinic today for 11 month follow-up status post I-125 interstitial implant for Gleason 6 adenocarcinoma the prostate.  REFERRING PROVIDER: Jodi Marble, MD  HPI: Patient is a 71 year old male now out 11 months status post I-125 interstitial implant for Gleason 6 adenocarcinoma presenting with a PSA of 3.8. Seen today in routine follow-up he is doing well does have some slight urinary incontinence although this is preceded his implant. He continues to have some problems with urgency and frequency and does takeMybetriq 50 mg daily His most recent PSA was 0.8 showing excellent response to therapy..  COMPLICATIONS OF TREATMENT: none  FOLLOW UP COMPLIANCE: keeps appointments   PHYSICAL EXAM:  BP 132/84   Pulse 77   Temp (!) 97.2 F (36.2 C)   Resp 20   Wt 258 lb 14.9 oz (117.4 kg)   BMI 35.12 kg/m  On rectal exam rectal sphincter tone is good. Prostate is smooth contracted without evidence of nodularity or mass. Sulcus is preserved bilaterally. No discrete nodularity is identified. No other rectal abnormalities are noted. Well-developed well-nourished patient in NAD. HEENT reveals PERLA, EOMI, discs not visualized.  Oral cavity is clear. No oral mucosal lesions are identified. Neck is clear without evidence of cervical or supraclavicular adenopathy. Lungs are clear to A&P. Cardiac examination is essentially unremarkable with regular rate and rhythm without murmur rub or thrill. Abdomen is benign with no organomegaly or masses noted. Motor sensory and DTR levels are equal and symmetric in the upper and lower extremities. Cranial nerves II through XII are grossly intact. Proprioception is intact. No peripheral adenopathy or edema is identified. No motor or sensory levels are noted. Crude visual fields are within normal range.  RADIOLOGY  RESULTS: No current films for review  PLAN: Present time is under good biochemical control of his prostate cancer. I'm please was overall progress. I've asked to see him back in 1 year for follow-up. He knows to call sooner with any concerns.  I would like to take this opportunity to thank you for allowing me to participate in the care of your patient.Armstead Peaks., MD

## 2017-04-30 ENCOUNTER — Encounter: Payer: Self-pay | Admitting: Emergency Medicine

## 2017-04-30 ENCOUNTER — Emergency Department
Admission: EM | Admit: 2017-04-30 | Discharge: 2017-04-30 | Disposition: A | Discharge status: Home / Self Care | Payer: PPO | Attending: Emergency Medicine | Admitting: Emergency Medicine

## 2017-04-30 DIAGNOSIS — Z79899 Other long term (current) drug therapy: Secondary | ICD-10-CM | POA: Insufficient documentation

## 2017-04-30 DIAGNOSIS — M5441 Lumbago with sciatica, right side: Secondary | ICD-10-CM | POA: Diagnosis not present

## 2017-04-30 DIAGNOSIS — G8929 Other chronic pain: Secondary | ICD-10-CM | POA: Diagnosis not present

## 2017-04-30 DIAGNOSIS — M5431 Sciatica, right side: Secondary | ICD-10-CM | POA: Diagnosis not present

## 2017-04-30 DIAGNOSIS — I2510 Atherosclerotic heart disease of native coronary artery without angina pectoris: Secondary | ICD-10-CM | POA: Diagnosis not present

## 2017-04-30 DIAGNOSIS — M5432 Sciatica, left side: Secondary | ICD-10-CM | POA: Insufficient documentation

## 2017-04-30 DIAGNOSIS — I10 Essential (primary) hypertension: Secondary | ICD-10-CM | POA: Diagnosis not present

## 2017-04-30 DIAGNOSIS — Z7982 Long term (current) use of aspirin: Secondary | ICD-10-CM | POA: Insufficient documentation

## 2017-04-30 DIAGNOSIS — Z87891 Personal history of nicotine dependence: Secondary | ICD-10-CM | POA: Diagnosis not present

## 2017-04-30 DIAGNOSIS — M5442 Lumbago with sciatica, left side: Secondary | ICD-10-CM | POA: Diagnosis not present

## 2017-04-30 DIAGNOSIS — J449 Chronic obstructive pulmonary disease, unspecified: Secondary | ICD-10-CM | POA: Diagnosis not present

## 2017-04-30 DIAGNOSIS — M545 Low back pain: Secondary | ICD-10-CM | POA: Diagnosis present

## 2017-04-30 DIAGNOSIS — Z7902 Long term (current) use of antithrombotics/antiplatelets: Secondary | ICD-10-CM | POA: Insufficient documentation

## 2017-04-30 MED ORDER — DEXAMETHASONE SODIUM PHOSPHATE 10 MG/ML IJ SOLN
10.0000 mg | Freq: Once | INTRAMUSCULAR | Status: AC
  Administered 2017-04-30: 10 mg via INTRAMUSCULAR
  Filled 2017-04-30: qty 1

## 2017-04-30 MED ORDER — PREDNISONE 10 MG PO TABS: 10.0000 mg | ORAL_TABLET | Freq: Every day | ORAL | 0 refills | Status: DC

## 2017-04-30 MED ORDER — CYCLOBENZAPRINE HCL 5 MG PO TABS: 5.0000 mg | ORAL_TABLET | Freq: Three times a day (TID) | ORAL | 0 refills | Status: DC | PRN

## 2017-04-30 MED ORDER — ORPHENADRINE CITRATE 30 MG/ML IJ SOLN
60.0000 mg | Freq: Once | INTRAMUSCULAR | Status: AC
  Administered 2017-04-30: 60 mg via INTRAMUSCULAR
  Filled 2017-04-30: qty 2

## 2017-04-30 NOTE — ED Notes (Signed)
See triage note  States he has had back surgery in past but over the past week he developed lower back pain which is radiating into both legs  States increased pain with ambulation

## 2017-04-30 NOTE — ED Notes (Signed)
Pt pushed in Wheelchair to lobby. VSS, NAD. He is waiting for friend to pick him up. No concerns or needs voiced.

## 2017-04-30 NOTE — ED Triage Notes (Signed)
Pt reports low back pain that shoots down the back of both legs for over one week. Pt denies NVD. Denies any fever at home. Pt denies new dysuria.

## 2017-04-30 NOTE — ED Provider Notes (Signed)
[facility] Emergency Department Provider Note  ____________________________________________  Time seen: Approximately 5:31 PM  I have reviewed the triage vital signs and the nursing notes.   HISTORY  Chief Complaint Back Pain and Leg Pain    HPI Christopher Orr is a 71 y.o. male who presents emergency department complaining of lower back pain with radicular symptoms down bilateral lower extremity's. Patient has a history of lumbago and has had a lumbar fusion in the L4-L5 region. Patient had a recent MRI in April which revealed mildcompression in L3-L4 region. Patient denies any acute injury precipitating this. Patient has a history of BPH and prostate cancer. Patient is undergoing CT implant therapy. Patient has urinary urge at baseline but states that he has not had any dysuria, polyuria, hematuria. No change from baseline. Patient reports that symptoms are bilateral from lower back to the region. No numbness or tingling in bilateral feet. No bowel or bladder dysfunction, saddle anesthesia, paresthesias. No other complaints at this time. No medications for this complaint prior to arrival.   Past Medical History:  Diagnosis Date  . Alcohol abuse   . Anemia   . BPH (benign prostatic hypertrophy) with urinary obstruction   . CAD (coronary artery disease)    a. s/p stent to LAD in 2006 at [facility], EF 60% at that time.  . Cancer [facility])    prostate  . Chronic pancreatitis ([place])   . COPD (chronic obstructive pulmonary disease) ([place])   . Depression   . Diverticulosis   . Drug use   . ED (erectile dysfunction)   . GERD (gastroesophageal reflux disease)   . Hepatitis C   . History of lumbar fusion   . Hyperlipidemia   . Hypertension   . Mitral regurgitation   . Urge incontinence     Patient Active Problem List   Diagnosis Date Noted  . Chronic viral hepatitis C ([place]) 09/24/2014  . Chest pain 07/28/2014  . COPD (chronic obstructive pulmonary disease) ([place])  07/28/2014  . Anxiety 07/28/2014  . Lactic acidosis 07/28/2014  . Aspiration pneumonia ([place]) 07/28/2014  . Elevated troponin I level 07/28/2014  . Acute kidney injury ([place]) 07/28/2014  . GERD (gastroesophageal reflux disease) 07/28/2014  . Heroin overdose ([place]) 07/27/2014  . Unresponsive     Past Surgical History:  Procedure Laterality Date  . ESOPHAGOGASTRODUODENOSCOPY N/A 12/22/2014   Procedure: ESOPHAGOGASTRODUODENOSCOPY (EGD);  Surgeon: [name], MD;  Location: [facility] ENDOSCOPY;  Service: Endoscopy;  Laterality: N/A;  . LUMBAR FUSION    . RADIOACTIVE SEED IMPLANT N/A 04/22/2016   Procedure: RADIOACTIVE SEED IMPLANT/BRACHYTHERAPY IMPLANT;  Surgeon: Hollice Espy, MD;  Location: [facility] ORS;  Service: Urology;  Laterality: N/A;  . TONSILLECTOMY      Prior to Admission medications   Medication Sig Start Date End Date Taking? Authorizing Provider  albuterol (PROVENTIL) (2.5 MG/3ML) 0.083% nebulizer solution USE TWICE A DAY PER NEBULIZER 03/06/15   [provider]  ALPRAZolam Duanne Moron) 1 MG tablet  03/26/17   [provider]  ARIPiprazole (ABILIFY) 10 MG tablet Take 10 mg by mouth daily.    [provider]  aspirin 81 MG tablet Take 81 mg by mouth daily. Reported on 01/15/2016    [provider]  celecoxib (CELEBREX) 400 MG capsule Take 400 mg by mouth daily after breakfast.    [provider]  clopidogrel (PLAVIX) 75 MG tablet Take 75 mg by mouth daily. Reported on 01/15/2016 10/31/14   [provider]  cyclobenzaprine (FLEXERIL) 5  MG tablet Take 1 tablet (5 mg total) by mouth 3 (three) times daily as needed for muscle spasms. 04/30/17   Bela Nyborg, Charline Bills, PA-C  diazepam (VALIUM) 10 MG tablet  02/16/16   [provider]  docusate sodium (COLACE) 100 MG capsule Take 1 capsule (100 mg total) by mouth 2 (two) times daily. 04/22/16   Hollice Espy, MD  escitalopram (LEXAPRO) 10 MG tablet  03/17/17   [provider]   FLUoxetine HCl 60 MG TABS Take 1 tablet by mouth daily. 10/18/14   [provider]  folic acid (FOLVITE) 1 MG tablet Take 1 mg by mouth daily.    [provider]  metoprolol succinate (TOPROL-XL) 25 MG 24 hr tablet  04/02/17   [provider]  Multiple Vitamins-Minerals (MULTIVITAMIN WITH MINERALS) tablet Take 1 tablet by mouth daily.    [provider]  MYRBETRIQ 50 MG TB24 tablet TAKE 1 TABLET (50 MG TOTAL) BY MOUTH DAILY. 09/27/16   Zara Council A, PA-C  pantoprazole (PROTONIX) 40 MG tablet Take 40 mg by mouth daily. Reported on 09/14/2015 09/03/15   [provider]  predniSONE (DELTASONE) 10 MG tablet Take 1 tablet (10 mg total) by mouth daily. 04/30/17   Bernarr Longsworth, Charline Bills, PA-C  rosuvastatin (CRESTOR) 10 MG tablet Take 10 mg by mouth daily. 08/01/15   [provider]  SPIRIVA RESPIMAT 2.5 MCG/ACT AERS  02/13/17   [provider]  tamsulosin (FLOMAX) 0.4 MG CAPS capsule Take 1 capsule (0.4 mg total) by mouth daily after supper. 10/09/16   Noreene Filbert, MD  tiZANidine (ZANAFLEX) 4 MG capsule Take 4 mg by mouth 3 (three) times daily.    [provider]  traZODone (DESYREL) 150 MG tablet Take 150 mg by mouth at bedtime.    [provider]    Allergies Patient has no known allergies.  Family History  Problem Relation Age of Onset  . Heart disease Father        Unclear details. Father passed in late 66's 2/2 cancer.  . Colon cancer Father   . Kidney disease Neg Hx   . Prostate cancer Neg Hx     Social History Social History  Substance Use Topics  . Smoking status: Former Smoker    Packs/day: 1.50    Types: Cigarettes    Start date: 07/28/1962    Quit date: 07/28/2012  . Smokeless tobacco: Never Used  . Alcohol use 8.4 oz/week    14 Cans of beer per week     Comment: 6pack and fifth of liquor each day     Review of Systems  Constitutional: No fever/chills Eyes: No visual changes. No discharge ENT:  No upper respiratory complaints. Cardiovascular: no chest pain. Respiratory: no cough. No SOB. Gastrointestinal: No abdominal pain.  No nausea, no vomiting.  No diarrhea.  No constipation. Genitourinary: Negative for dysuria. No hematuria. No changes from baseline Musculoskeletal: Positive for lower back pain with radicular symptoms down bilateral legs Skin: Negative for rash, abrasions, lacerations, ecchymosis. Neurological: Negative for headaches, focal weakness or numbness. 10-point ROS otherwise negative.  ____________________________________________   PHYSICAL EXAM:  VITAL SIGNS: ED Triage Vitals [04/30/17 1640]  Enc Vitals Group     BP 118/74     Pulse Rate 75     Resp 18     Temp 97.9 F (36.6 C)     Temp Source Oral     SpO2 94 %     Weight 260 lb (117.9 kg)  Height 6' (1.829 m)     Head Circumference      Peak Flow      Pain Score 10     Pain Loc      Pain Edu?      Excl. in [place]?      Constitutional: Alert and oriented. Well appearing and in no acute distress. Eyes: Conjunctivae are normal. PERRL. EOMI. Head: Atraumatic. ENT:      Ears:       Nose: No congestion/rhinnorhea.      Mouth/Throat: Mucous membranes are moist.  Neck: No stridor.  No cervical spine tenderness to palpation  Cardiovascular: Normal rate, regular rhythm. Normal S1 and S2.  Good peripheral circulation. Respiratory: Normal respiratory effort without tachypnea or retractions. Lungs CTAB. Good air entry to the bases with no decreased or absent breath sounds. Gastrointestinal: Bowel sounds 4 quadrants. Soft and nontender to palpation. No guarding or rigidity. No palpable masses. No distention. No CVA tenderness. Musculoskeletal: Full range of motion to all extremities. No gross deformities appreciated. No deformities to lumbar spine upon inspection. No tenderness to palpation midline or paraspinal muscle groups. No palpable abnormality or step-off. No tenderness to palpation over bilateral  sciatic notches. Negative straight leg raise bilaterally. Dorsalis pedis pulse intact bilateral lower extremity. Sensation intact and equal bilateral lower extremity's. Neurologic:  Normal speech and language. No gross focal neurologic deficits are appreciated.  Skin:  Skin is warm, dry and intact. No rash noted. Psychiatric: Mood and affect are normal. Speech and behavior are normal. Patient exhibits appropriate insight and judgement.   ____________________________________________   LABS (all labs ordered are listed, but only abnormal results are displayed)  Labs Reviewed - No data to display ____________________________________________  EKG   ____________________________________________  RADIOLOGY   No results found.  ____________________________________________    PROCEDURES  Procedure(s) performed:    Procedures    Medications  orphenadrine (NORFLEX) injection 60 mg (not administered)  dexamethasone (DECADRON) injection 10 mg (not administered)     ____________________________________________   INITIAL IMPRESSION / ASSESSMENT AND PLAN / ED COURSE  Pertinent labs & imaging results that were available during my care of the patient were reviewed by me and considered in my medical decision making (see chart for details).  Review of the [place] CSRS was performed in accordance of the [place] prior to dispensing any controlled drugs.     Patient's diagnosis is consistent with acute on chronic lumbago with radicular symptoms. At this time, exam was reassuring with no indication for labs or imaging. Patient will be placed on steroid taper and low dose muscle relaxer for symptom control.. Patient will follow up primary care as needed.. Patient is given ED precautions to return to the ED for any worsening or new symptoms.     ____________________________________________  FINAL CLINICAL IMPRESSION(S) / ED DIAGNOSES  Final diagnoses:  Chronic midline low back pain  with bilateral sciatica      NEW MEDICATIONS STARTED DURING THIS VISIT:  New Prescriptions   CYCLOBENZAPRINE (FLEXERIL) 5 MG TABLET    Take 1 tablet (5 mg total) by mouth 3 (three) times daily as needed for muscle spasms.   PREDNISONE (DELTASONE) 10 MG TABLET    Take 1 tablet (10 mg total) by mouth daily.        This chart was dictated using voice recognition software/Dragon. Despite best efforts to proofread, errors can occur which can change the meaning. Any change was purely unintentional.    Darletta Moll, PA-C 04/30/17 1830  Rudene Re, MD 05/07/17 1538

## 2017-05-08 ENCOUNTER — Encounter: Payer: Self-pay | Admitting: Urology

## 2017-05-08 ENCOUNTER — Ambulatory Visit (INDEPENDENT_AMBULATORY_CARE_PROVIDER_SITE_OTHER): Payer: PPO | Admitting: Urology

## 2017-05-08 VITALS — BP 105/75 | HR 78 | Ht 72.0 in | Wt 256.4 lb

## 2017-05-08 DIAGNOSIS — N3941 Urge incontinence: Secondary | ICD-10-CM | POA: Diagnosis not present

## 2017-05-08 DIAGNOSIS — C61 Malignant neoplasm of prostate: Secondary | ICD-10-CM | POA: Diagnosis not present

## 2017-05-08 DIAGNOSIS — N528 Other male erectile dysfunction: Secondary | ICD-10-CM

## 2017-05-08 LAB — BLADDER SCAN AMB NON-IMAGING

## 2017-05-08 NOTE — Progress Notes (Signed)
05/08/2017 9:56 AM   Christopher Orr 1946/02/21 [id number]  Referring provider: Jodi Marble, MD [place], [place] [address]  No chief complaint on file.   HPI: 71 year old male with history of prostate cancer, BPH with severe LUTS, and ED.  History of prostate cancer He was dx with low risk Gleason 3+3 prostate cancer s/p brachytherapy seed implant 04/22/16 with I-125 seeds.    He was dx after rise in his PSA up to 3.8 on 12/20/15 up from 3.2 on 09/24/15, and 2.2 the previous year s/p prostate biopsy on 01/15/16 demonstrating prostate cancer, T1c, Gleason 3+3 involving 7/12 cores including all cores on left and single core at right apex up to 70% tissue.  TRUS volume 42 cc.  Most recent PSA now down to 0.88 which appears to be his nadir.  BPH with LUTS/ urinary frequency He does also have baseline lower urinary tract symptoms including urinary urgency and urge incontinence. He is currently on Mybetriq 50 mg and flomax 0.4 mg.     He reports today that his frequency and urgency are improving. His cut back on his coffee.  He also reports that he is taking steroids and muscle relaxant for back pain and sciatic nerve which also somehow seems to help with bladder. He continues to take Mybetriq 50 mg daily.  No gross hematuria or dysuria.    Post void residual today minimal. IPSS as below.  ED Baseline severe ED, unable to achieve or maintain an erection.        IPSS    Row Name 05/08/17 0900         International Prostate Symptom Score   How often have you had the sensation of not emptying your bladder? Not at All     How often have you had to urinate less than every two hours? More than half the time     How often have you found you stopped and started again several times when you urinated? About half the time     How often have you found it difficult to postpone urination? More than half the time     How often have you had a weak urinary stream? Less than  half the time     How often have you had to strain to start urination? Not at All     How many times did you typically get up at night to urinate? 2 Times     Total IPSS Score 15       Quality of Life due to urinary symptoms   If you were to spend the rest of your life with your urinary condition just the way it is now how would you feel about that? Mixed        Score:  1-7 Mild 8-19 Moderate 20-35 Severe   PMH: Past Medical History:  Diagnosis Date  . Alcohol abuse   . Anemia   . BPH (benign prostatic hypertrophy) with urinary obstruction   . CAD (coronary artery disease)    a. s/p stent to LAD in 2006 at [facility], EF 60% at that time.  . Cancer [facility])    prostate  . Chronic pancreatitis ([place])   . COPD (chronic obstructive pulmonary disease) ([place])   . Depression   . Diverticulosis   . Drug use   . ED (erectile dysfunction)   . GERD (gastroesophageal reflux disease)   . Hepatitis C   . History of lumbar fusion   . Hyperlipidemia   .  Hypertension   . Mitral regurgitation   . Urge incontinence     Surgical History: Past Surgical History:  Procedure Laterality Date  . ESOPHAGOGASTRODUODENOSCOPY N/A 12/22/2014   Procedure: ESOPHAGOGASTRODUODENOSCOPY (EGD);  Surgeon: [name], MD;  Location: [facility] ENDOSCOPY;  Service: Endoscopy;  Laterality: N/A;  . LUMBAR FUSION    . RADIOACTIVE SEED IMPLANT N/A 04/22/2016   Procedure: RADIOACTIVE SEED IMPLANT/BRACHYTHERAPY IMPLANT;  Surgeon: Hollice Espy, MD;  Location: [facility] ORS;  Service: Urology;  Laterality: N/A;  . TONSILLECTOMY      Home Medications:  Allergies as of 05/08/2017   No Known Allergies     Medication List       Accurate as of 05/08/17  9:56 AM. Always use your most recent med list.          albuterol (2.5 MG/3ML) 0.083% nebulizer solution Commonly known as:  PROVENTIL USE TWICE A DAY PER NEBULIZER   ALPRAZolam 1 MG tablet Commonly known as:  XANAX   ARIPiprazole 10 MG tablet Commonly known  as:  ABILIFY Take 10 mg by mouth daily.   aspirin 81 MG tablet Take 81 mg by mouth daily. Reported on 01/15/2016   celecoxib 400 MG capsule Commonly known as:  CELEBREX Take 400 mg by mouth daily after breakfast.   clopidogrel 75 MG tablet Commonly known as:  PLAVIX Take 75 mg by mouth daily. Reported on 01/15/2016   cyclobenzaprine 5 MG tablet Commonly known as:  FLEXERIL Take 1 tablet (5 mg total) by mouth 3 (three) times daily as needed for muscle spasms.   docusate sodium 100 MG capsule Commonly known as:  COLACE Take 1 capsule (100 mg total) by mouth 2 (two) times daily.   escitalopram 10 MG tablet Commonly known as:  LEXAPRO   FLUoxetine HCl 60 MG Tabs Take 1 tablet by mouth daily.   folic acid 1 MG tablet Commonly known as:  FOLVITE Take 1 mg by mouth daily.   metoprolol succinate 25 MG 24 hr tablet Commonly known as:  TOPROL-XL   multivitamin with minerals tablet Take 1 tablet by mouth daily.   MYRBETRIQ 50 MG Tb24 tablet Generic drug:  mirabegron ER TAKE 1 TABLET (50 MG TOTAL) BY MOUTH DAILY.   pantoprazole 40 MG tablet Commonly known as:  PROTONIX Take 40 mg by mouth daily. Reported on 09/14/2015   predniSONE 10 MG tablet Commonly known as:  DELTASONE Take 1 tablet (10 mg total) by mouth daily.   rosuvastatin 10 MG tablet Commonly known as:  CRESTOR Take 10 mg by mouth daily.   SPIRIVA RESPIMAT 2.5 MCG/ACT Aers Generic drug:  Tiotropium Bromide Monohydrate   tamsulosin 0.4 MG Caps capsule Commonly known as:  FLOMAX Take 1 capsule (0.4 mg total) by mouth daily after supper.   traZODone 150 MG tablet Commonly known as:  DESYREL Take 150 mg by mouth at bedtime.       Allergies: No Known Allergies  Family History: Family History  Problem Relation Age of Onset  . Heart disease Father        Unclear details. Father passed in late 50's 2/2 cancer.  . Colon cancer Father   . Kidney disease Neg Hx   . Prostate cancer Neg Hx     Social  History:  reports that he quit smoking about 4 years ago. His smoking use included Cigarettes. He started smoking about 54 years ago. He smoked 1.50 packs per day. He has never used smokeless tobacco. He reports that he drinks about  8.4 oz of alcohol per week . He reports that he uses drugs, including Marijuana.  ROS: UROLOGY Frequent Urination?: No Hard to postpone urination?: Yes Burning/pain with urination?: No Get up at night to urinate?: No Leakage of urine?: No Urine stream starts and stops?: No Trouble starting stream?: No Do you have to strain to urinate?: No Blood in urine?: No Urinary tract infection?: No Sexually transmitted disease?: No Injury to kidneys or bladder?: No Painful intercourse?: No Weak stream?: No Erection problems?: No Penile pain?: No  Gastrointestinal Nausea?: No Vomiting?: No Indigestion/heartburn?: No Diarrhea?: No Constipation?: No  Constitutional Fever: No Night sweats?: No Weight loss?: No Fatigue?: No  Skin Skin rash/lesions?: No Itching?: No  Eyes Blurred vision?: No Double vision?: No  Ears/Nose/Throat Sore throat?: No Sinus problems?: No  Hematologic/Lymphatic Swollen glands?: No Easy bruising?: No  Cardiovascular Leg swelling?: No Chest pain?: No  Respiratory Cough?: No Shortness of breath?: No  Endocrine Excessive thirst?: No  Musculoskeletal Back pain?: No Joint pain?: No  Neurological Headaches?: No Dizziness?: No  Psychologic Depression?: No Anxiety?: No  Physical Exam: BP 105/75 (BP Location: Left Arm, Patient Position: Sitting, Cuff Size: Normal)   Pulse 78   Ht 6' (1.829 m)   Wt 256 lb 6.4 oz (116.3 kg)   BMI 34.77 kg/m   Constitutional:  Alert and oriented, No acute distress. HEENT: [place] AT, moist mucus membranes.  Trachea midline, no masses. Cardiovascular: No clubbing, cyanosis, or edema. Respiratory: Normal respiratory effort, no increased work of breathing. GI: Abdomen is soft,  nontender, nondistended, no abdominal masses.   Skin: No rashes, bruises or suspicious lesions. Neurologic: Grossly intact, no focal deficits, moving all 4 extremities. Psychiatric: Normal mood and affect.  Laboratory Data: Lab Results  Component Value Date   WBC 7.8 04/08/2016   HGB 13.4 04/08/2016   HCT 40.3 04/08/2016   MCV 98.8 04/08/2016   PLT 260 04/08/2016    Lab Results  Component Value Date   CREATININE 1.10 11/08/2015    Results for orders placed or performed in visit on 05/08/17  Bladder Scan (Post Void Residual) in office  Result Value Ref Range   Scan Result 62ml     Assessment & Plan:    1. Prostate cancer [facility]) cT1c prostate cancer, PSA 3.8, Gleason 3+3 in 7/12 cores, relatively high volume LOW RISK diease S/p I-125 brachytherapy 03/2016 PSA down to 0.88, will continue to follow PSA q6 months   2. Urge incontinence Continue Mybetriq 50 mg and flomax 0.4 mg Currently satisfied with voiding Excellent emptying today  3. Erectile dysfunction, unspecified erectile dysfunction type Severe, no treatment desired at this time   Return in about 6 months (around 11/06/2017) for PSA.  Hollice Espy, MD  [facility] [address] [place], [place] [address] [phone]

## 2017-06-02 DIAGNOSIS — M545 Low back pain: Secondary | ICD-10-CM | POA: Diagnosis not present

## 2017-06-02 DIAGNOSIS — J449 Chronic obstructive pulmonary disease, unspecified: Secondary | ICD-10-CM | POA: Diagnosis not present

## 2017-06-02 DIAGNOSIS — N400 Enlarged prostate without lower urinary tract symptoms: Secondary | ICD-10-CM | POA: Diagnosis not present

## 2017-06-02 DIAGNOSIS — M13869 Other specified arthritis, unspecified knee: Secondary | ICD-10-CM | POA: Diagnosis not present

## 2017-06-02 DIAGNOSIS — B351 Tinea unguium: Secondary | ICD-10-CM | POA: Diagnosis not present

## 2017-06-02 DIAGNOSIS — N3941 Urge incontinence: Secondary | ICD-10-CM | POA: Diagnosis not present

## 2017-06-02 DIAGNOSIS — F418 Other specified anxiety disorders: Secondary | ICD-10-CM | POA: Diagnosis not present

## 2017-06-02 DIAGNOSIS — S93492A Sprain of other ligament of left ankle, initial encounter: Secondary | ICD-10-CM | POA: Diagnosis not present

## 2017-06-02 DIAGNOSIS — K739 Chronic hepatitis, unspecified: Secondary | ICD-10-CM | POA: Diagnosis not present

## 2017-06-02 DIAGNOSIS — N189 Chronic kidney disease, unspecified: Secondary | ICD-10-CM | POA: Diagnosis not present

## 2017-06-02 DIAGNOSIS — C61 Malignant neoplasm of prostate: Secondary | ICD-10-CM | POA: Diagnosis not present

## 2017-06-02 DIAGNOSIS — I2510 Atherosclerotic heart disease of native coronary artery without angina pectoris: Secondary | ICD-10-CM | POA: Diagnosis not present

## 2017-08-07 ENCOUNTER — Ambulatory Visit
Admission: RE | Admit: 2017-08-07 | Discharge: 2017-08-07 | Disposition: A | Discharge status: Home / Self Care | Payer: Medicare HMO | Source: Ambulatory Visit | Attending: Internal Medicine | Admitting: Internal Medicine

## 2017-08-07 ENCOUNTER — Other Ambulatory Visit: Payer: Self-pay | Admitting: Cardiology

## 2017-08-07 ENCOUNTER — Ambulatory Visit
Admission: RE | Admit: 2017-08-07 | Discharge: 2017-08-07 | Disposition: A | Discharge status: Home / Self Care | Payer: Medicare HMO | Source: Ambulatory Visit | Attending: Cardiology | Admitting: Cardiology

## 2017-08-07 DIAGNOSIS — M549 Dorsalgia, unspecified: Secondary | ICD-10-CM | POA: Diagnosis present

## 2017-08-07 DIAGNOSIS — Z981 Arthrodesis status: Secondary | ICD-10-CM | POA: Insufficient documentation

## 2017-08-07 DIAGNOSIS — R52 Pain, unspecified: Secondary | ICD-10-CM

## 2017-08-07 DIAGNOSIS — M47816 Spondylosis without myelopathy or radiculopathy, lumbar region: Secondary | ICD-10-CM | POA: Insufficient documentation

## 2017-08-07 DIAGNOSIS — M4316 Spondylolisthesis, lumbar region: Secondary | ICD-10-CM | POA: Insufficient documentation

## 2017-09-17 ENCOUNTER — Encounter: Payer: Self-pay | Admitting: Urology

## 2017-09-17 ENCOUNTER — Telehealth: Payer: Self-pay

## 2017-09-17 ENCOUNTER — Ambulatory Visit: Payer: Medicare HMO | Admitting: Urology

## 2017-09-17 VITALS — BP 107/70 | HR 93 | Ht 72.0 in | Wt 260.0 lb

## 2017-09-17 DIAGNOSIS — N5082 Scrotal pain: Secondary | ICD-10-CM | POA: Diagnosis not present

## 2017-09-17 NOTE — Telephone Encounter (Signed)
If pain severe or fevers, to ED for eval ASP.  Are there any free slots in the office this afternoon?  Hollice Espy, MD

## 2017-09-17 NOTE — Progress Notes (Signed)
09/17/2017 3:34 PM   Christopher Orr [birth year] [id number]  Referring provider: Jodi Marble, MD [place], [place] [address]  Chief Complaint  Patient presents with  . Testicle Pain    HPI: Patient is a 72 year old Caucasian male with history of prostate cancer, BPH with severe LU TS, ED who presents today requesting an urgent appointment for scrotal pain.  He states he has been having scrotal pain for the last month.  It is worse on the left side.  He denied any injury to the scrotal area.  States standing and walking make the pain worse.  States sitting eases the pain.  He has had the pain becomes so intense he had to leave [employer].  He has not noted any scrotal swelling.  He has not had any dysuria, gross hematuria or suprapubic pain.  He has not had any abdominal or flank pain.  He has not had fevers, chills, nausea or vomiting.  He continues to have baseline nocturia incontinence and erectile dysfunction.  PMH: Past Medical History:  Diagnosis Date  . Alcohol abuse   . Anemia   . BPH (benign prostatic hypertrophy) with urinary obstruction   . CAD (coronary artery disease)    a. s/p stent to LAD in 2006 at [facility], EF 60% at that time.  . Cancer [facility])    prostate  . Chronic pancreatitis ([place])   . COPD (chronic obstructive pulmonary disease) ([place])   . Depression   . Diverticulosis   . Drug use   . ED (erectile dysfunction)   . GERD (gastroesophageal reflux disease)   . Hepatitis C   . History of lumbar fusion   . Hyperlipidemia   . Hypertension   . Mitral regurgitation   . Urge incontinence     Surgical History: Past Surgical History:  Procedure Laterality Date  . ESOPHAGOGASTRODUODENOSCOPY N/A 12/22/2014   Procedure: ESOPHAGOGASTRODUODENOSCOPY (EGD);  Surgeon: [name], MD;  Location: [facility] ENDOSCOPY;  Service: Endoscopy;  Laterality: N/A;  . LUMBAR FUSION    . RADIOACTIVE SEED IMPLANT N/A 04/22/2016   Procedure: RADIOACTIVE SEED  IMPLANT/BRACHYTHERAPY IMPLANT;  Surgeon: Hollice Espy, MD;  Location: [facility] ORS;  Service: Urology;  Laterality: N/A;  . TONSILLECTOMY      Home Medications:  Allergies as of 09/17/2017   No Known Allergies     Medication List        Accurate as of 09/17/17  3:34 PM. Always use your most recent med list.          albuterol (2.5 MG/3ML) 0.083% nebulizer solution Commonly known as:  PROVENTIL USE TWICE A DAY PER NEBULIZER   ALPRAZolam 1 MG tablet Commonly known as:  XANAX   ARIPiprazole 10 MG tablet Commonly known as:  ABILIFY Take 10 mg by mouth daily.   aspirin 81 MG tablet Take 81 mg by mouth daily. Reported on 01/15/2016   celecoxib 400 MG capsule Commonly known as:  CELEBREX Take 400 mg by mouth daily after breakfast.   clopidogrel 75 MG tablet Commonly known as:  PLAVIX Take 75 mg by mouth daily. Reported on 01/15/2016   cyclobenzaprine 5 MG tablet Commonly known as:  FLEXERIL Take 1 tablet (5 mg total) by mouth 3 (three) times daily as needed for muscle spasms.   docusate sodium 100 MG capsule Commonly known as:  COLACE Take 1 capsule (100 mg total) by mouth 2 (two) times daily.   escitalopram 10 MG tablet Commonly known as:  LEXAPRO  FLUoxetine HCl 60 MG Tabs Take 1 tablet by mouth daily.   folic acid 1 MG tablet Commonly known as:  FOLVITE Take 1 mg by mouth daily.   metoprolol succinate 25 MG 24 hr tablet Commonly known as:  TOPROL-XL   multivitamin with minerals tablet Take 1 tablet by mouth daily.   MYRBETRIQ 50 MG Tb24 tablet Generic drug:  mirabegron ER TAKE 1 TABLET (50 MG TOTAL) BY MOUTH DAILY.   pantoprazole 40 MG tablet Commonly known as:  PROTONIX Take 40 mg by mouth daily. Reported on 09/14/2015   rosuvastatin 10 MG tablet Commonly known as:  CRESTOR Take 10 mg by mouth daily.   SPIRIVA RESPIMAT 2.5 MCG/ACT Aers Generic drug:  Tiotropium Bromide Monohydrate   tamsulosin 0.4 MG Caps capsule Commonly known as:  FLOMAX Take 1  capsule (0.4 mg total) by mouth daily after supper.   traZODone 150 MG tablet Commonly known as:  DESYREL Take 150 mg by mouth at bedtime.       Allergies: No Known Allergies  Family History: Family History  Problem Relation Age of Onset  . Heart disease Father        Unclear details. Father passed in late 63's 2/2 cancer.  . Colon cancer Father   . Kidney disease Neg Hx   . Prostate cancer Neg Hx     Social History:  reports that he quit smoking about 5 years ago. His smoking use included cigarettes. He started smoking about 55 years ago. He smoked 1.50 packs per day. he has never used smokeless tobacco. He reports that he drinks about 8.4 oz of alcohol per week. He reports that he uses drugs. Drug: Marijuana.  ROS: UROLOGY Frequent Urination?: No Hard to postpone urination?: No Burning/pain with urination?: No Get up at night to urinate?: Yes Leakage of urine?: Yes Urine stream starts and stops?: No Trouble starting stream?: No Do you have to strain to urinate?: No Blood in urine?: No Urinary tract infection?: No Sexually transmitted disease?: No Injury to kidneys or bladder?: No Painful intercourse?: No Weak stream?: No Erection problems?: Yes Penile pain?: No  Gastrointestinal Nausea?: No Vomiting?: No Indigestion/heartburn?: Yes Diarrhea?: No Constipation?: No  Constitutional Fever: No Night sweats?: No Weight loss?: No Fatigue?: No  Skin Skin rash/lesions?: No Itching?: No  Eyes Blurred vision?: No Double vision?: No  Ears/Nose/Throat Sore throat?: No Sinus problems?: Yes  Hematologic/Lymphatic Swollen glands?: No Easy bruising?: Yes  Cardiovascular Leg swelling?: No Chest pain?: No  Respiratory Cough?: No Shortness of breath?: Yes  Endocrine Excessive thirst?: No  Musculoskeletal Back pain?: Yes Joint pain?: No  Neurological Headaches?: No Dizziness?: No  Psychologic Depression?: Yes Anxiety?: Yes  Physical Exam: BP  107/70   Pulse 93   Ht 6' (1.829 m)   Wt 260 lb (117.9 kg)   BMI 35.26 kg/m   Constitutional: Well nourished. Alert and oriented, No acute distress. HEENT: [place] AT, moist mucus membranes. Trachea midline, no masses. Cardiovascular: No clubbing, cyanosis, or edema. Respiratory: Normal respiratory effort, no increased work of breathing. GI: Abdomen is soft, non tender, non distended, no abdominal masses. Liver and spleen not palpable.  No hernias appreciated.  Stool sample for occult testing is not indicated.   GU: No CVA tenderness.  No bladder fullness or masses.  Patient with uncircumcised phallus.   Foreskin easily retracted   Urethral meatus is patent.  No penile discharge. No penile lesions or rashes. Scrotum without lesions, cysts, rashes and/or edema.  Testicles are located  scrotally bilaterally. No masses are appreciated in the testicles. Left and right epididymis are normal.  No tenderness when palpating the testicles or epididymis bilaterally.  Cremasteric reflex intact.   Rectal: Deferred.  Skin: No rashes, bruises or suspicious lesions. Lymph: No cervical or inguinal adenopathy. Neurologic: Grossly intact, no focal deficits, moving all 4 extremities. Psychiatric: Normal mood and affect.  Laboratory Data: Lab Results  Component Value Date   WBC 7.8 04/08/2016   HGB 13.4 04/08/2016   HCT 40.3 04/08/2016   MCV 98.8 04/08/2016   PLT 260 04/08/2016    Lab Results  Component Value Date   CREATININE 1.10 11/08/2015    Lab Results  Component Value Date   PSA 1.74 10/02/2016    No results found for: TESTOSTERONE  No results found for: HGBA1C  No results found for: TSH  No results found for: CHOL, HDL, CHOLHDL, VLDL, LDLCALC  Lab Results  Component Value Date   AST 47 (H) 07/27/2014   Lab Results  Component Value Date   ALT 23 07/27/2014   No components found for: ALKALINEPHOPHATASE No components found for: BILIRUBINTOTAL  No results found for:  ESTRADIOL  Urinalysis    Component Value Date/Time   COLORURINE YELLOW 07/27/2014 0236   APPEARANCEUR Hazy (A) 08/08/2016 1151   LABSPEC 1.015 07/27/2014 0236   PHURINE 5.0 07/27/2014 0236   GLUCOSEU Negative 08/08/2016 1151   HGBUR TRACE (A) 07/27/2014 0236   BILIRUBINUR Negative 08/08/2016 1151   KETONESUR NEGATIVE 07/27/2014 0236   PROTEINUR 1+ (A) 08/08/2016 1151   PROTEINUR 100 (A) 07/27/2014 0236   UROBILINOGEN 0.2 07/27/2014 0236   NITRITE Negative 08/08/2016 1151   NITRITE NEGATIVE 07/27/2014 0236   LEUKOCYTESUR Negative 08/08/2016 1151    I have reviewed the labs.  Assessment & Plan:    1. Scrotal pain  -Most likely due to a varicocele especially since he has noted the pain more obvious in the left and worsened with walking and standing  - advised the patient to wear supportive underwear and/or an athletic supporter and avoid straining  - patient would like an ultrasound as he is worried that he may have testicular cancer and his PCP told him he needed one  - will obtain a scrotal ultrasound for further evaluation and to rule out any ominous etiology for his testicular pain  2. Prostate cancer [facility]) cT1c prostate cancer, PSA 3.8, Gleason 3+3 in 7/12 cores, relatively high volume LOW RISK diease S/p I-125 brachytherapy 03/2016 PSA down to 0.88, will continue to follow PSA q6 months (10/2017)  3. Urge incontinence Continue Mybetriq 50 mg and flomax 0.4 mg Currently satisfied with voiding Excellent emptying today  4. Erectile dysfunction, unspecified erectile dysfunction type Severe, no treatment desired at this time  Return for pending scrotal ultrasound .  These notes generated with voice recognition software. I apologize for typographical errors.  Zara Council, [facility] [address] [place], [place] [address] [phone]

## 2017-09-17 NOTE — Telephone Encounter (Signed)
Pt called stating he has having severe left side testicular pain. Pt stated that its a pulling sensation when he walks. Pt stated that PCP will not treat or order any imaging. Please advise.

## 2017-09-28 ENCOUNTER — Ambulatory Visit
Admission: RE | Admit: 2017-09-28 | Discharge: 2017-09-28 | Disposition: A | Discharge status: Home / Self Care | Payer: Medicare HMO | Source: Ambulatory Visit | Attending: Urology | Admitting: Urology

## 2017-09-28 DIAGNOSIS — N509 Disorder of male genital organs, unspecified: Secondary | ICD-10-CM | POA: Diagnosis not present

## 2017-09-28 DIAGNOSIS — N5082 Scrotal pain: Secondary | ICD-10-CM

## 2017-09-28 DIAGNOSIS — N503 Cyst of epididymis: Secondary | ICD-10-CM | POA: Diagnosis not present

## 2017-09-28 DIAGNOSIS — I861 Scrotal varices: Secondary | ICD-10-CM | POA: Insufficient documentation

## 2017-10-02 ENCOUNTER — Telehealth: Payer: Self-pay

## 2017-10-02 NOTE — Telephone Encounter (Signed)
Letter sent.

## 2017-10-02 NOTE — Telephone Encounter (Signed)
-----   Message from Nori Riis, PA-C sent at 09/29/2017  7:24 AM EST ----- Please let Mr. Schwenke know that his scrotal ultrasound was negative for any cancer.  He has benign cyst and a small left varicocele.  I would suggest he wear supportive underwear to help with the discomfort.

## 2017-10-05 ENCOUNTER — Encounter: Payer: Self-pay | Admitting: Urology

## 2017-10-05 ENCOUNTER — Ambulatory Visit (INDEPENDENT_AMBULATORY_CARE_PROVIDER_SITE_OTHER): Payer: Medicare HMO | Admitting: Urology

## 2017-10-05 VITALS — BP 111/74 | HR 86 | Ht 70.0 in | Wt 270.0 lb

## 2017-10-05 DIAGNOSIS — C61 Malignant neoplasm of prostate: Secondary | ICD-10-CM

## 2017-10-05 DIAGNOSIS — N5082 Scrotal pain: Secondary | ICD-10-CM

## 2017-10-05 NOTE — Progress Notes (Signed)
10/05/2017 11:04 AM   Christopher Orr [birth year] [id number]  Referring provider: Jodi Marble, MD [place], [place] [address]  Chief Complaint  Patient presents with  . Follow-up    HPI: Patient is a 72 year old Caucasian male with history of prostate cancer, BPH with severe LU TS, ED and scrotal pain who presents for his scrotal ultrasound results.    Background history He states he has been having scrotal pain for the last month.  It is worse on the left side.  He denied any injury to the scrotal area.  States standing and walking make the pain worse.  States sitting eases the pain.  He has had the pain becomes so intense he had to leave [employer].  He has not noted any scrotal swelling.  He has not had any dysuria, gross hematuria or suprapubic pain.  He has not had any abdominal or flank pain.  He has not had fevers, chills, nausea or vomiting.  He continues to have baseline nocturia incontinence and erectile dysfunction.  Today, he is having his baseline urgency, nocturia and incontinence.  He also has his baseline erectile dysfunction.    Scrotal ultrasound performed on 09/28/2017 was negative for testicular torsion.  Multiple epididymal cysts.  Small left varicocele.   Probable tubular ectasia of the rete testes on the right.    He is still having intermittent testicular pain, but he states that it has reduced.  Patient denies any gross hematuria, dysuria or suprapubic/flank pain.  Patient denies any fevers, chills, nausea or vomiting.   PMH: Past Medical History:  Diagnosis Date  . Alcohol abuse   . Anemia   . BPH (benign prostatic hypertrophy) with urinary obstruction   . CAD (coronary artery disease)    a. s/p stent to LAD in 2006 at [facility], EF 60% at that time.  . Cancer [facility])    prostate  . Chronic pancreatitis ([place])   . COPD (chronic obstructive pulmonary disease) ([place])   . Depression   . Diverticulosis   . Drug use   . ED (erectile  dysfunction)   . GERD (gastroesophageal reflux disease)   . Hepatitis C   . History of lumbar fusion   . Hyperlipidemia   . Hypertension   . Mitral regurgitation   . Urge incontinence     Surgical History: Past Surgical History:  Procedure Laterality Date  . ESOPHAGOGASTRODUODENOSCOPY N/A 12/22/2014   Procedure: ESOPHAGOGASTRODUODENOSCOPY (EGD);  Surgeon: [name], MD;  Location: [facility] ENDOSCOPY;  Service: Endoscopy;  Laterality: N/A;  . LUMBAR FUSION    . RADIOACTIVE SEED IMPLANT N/A 04/22/2016   Procedure: RADIOACTIVE SEED IMPLANT/BRACHYTHERAPY IMPLANT;  Surgeon: Hollice Espy, MD;  Location: [facility] ORS;  Service: Urology;  Laterality: N/A;  . TONSILLECTOMY      Home Medications:  Allergies as of 10/05/2017   No Known Allergies     Medication List        Accurate as of 10/05/17 11:04 AM. Always use your most recent med list.          albuterol (2.5 MG/3ML) 0.083% nebulizer solution Commonly known as:  PROVENTIL USE TWICE A DAY PER NEBULIZER   ALPRAZolam 1 MG tablet Commonly known as:  XANAX   ARIPiprazole 10 MG tablet Commonly known as:  ABILIFY Take 10 mg by mouth daily.   aspirin 81 MG tablet Take 81 mg by mouth daily. Reported on 01/15/2016   celecoxib 400 MG capsule Commonly known as:  CELEBREX  Take 400 mg by mouth daily after breakfast.   clopidogrel 75 MG tablet Commonly known as:  PLAVIX Take 75 mg by mouth daily. Reported on 01/15/2016   cyclobenzaprine 5 MG tablet Commonly known as:  FLEXERIL Take 1 tablet (5 mg total) by mouth 3 (three) times daily as needed for muscle spasms.   docusate sodium 100 MG capsule Commonly known as:  COLACE Take 1 capsule (100 mg total) by mouth 2 (two) times daily.   escitalopram 10 MG tablet Commonly known as:  LEXAPRO   FLUoxetine HCl 60 MG Tabs Take 1 tablet by mouth daily.   folic acid 1 MG tablet Commonly known as:  FOLVITE Take 1 mg by mouth daily.   metoprolol succinate 25 MG 24 hr  tablet Commonly known as:  TOPROL-XL   multivitamin with minerals tablet Take 1 tablet by mouth daily.   MYRBETRIQ 50 MG Tb24 tablet Generic drug:  mirabegron ER TAKE 1 TABLET (50 MG TOTAL) BY MOUTH DAILY.   pantoprazole 40 MG tablet Commonly known as:  PROTONIX Take 40 mg by mouth daily. Reported on 09/14/2015   rosuvastatin 10 MG tablet Commonly known as:  CRESTOR Take 10 mg by mouth daily.   SPIRIVA RESPIMAT 2.5 MCG/ACT Aers Generic drug:  Tiotropium Bromide Monohydrate   tamsulosin 0.4 MG Caps capsule Commonly known as:  FLOMAX Take 1 capsule (0.4 mg total) by mouth daily after supper.   traZODone 150 MG tablet Commonly known as:  DESYREL Take 150 mg by mouth at bedtime.       Allergies: No Known Allergies  Family History: Family History  Problem Relation Age of Onset  . Heart disease Father        Unclear details. Father passed in late 4's 2/2 cancer.  . Colon cancer Father   . Kidney disease Neg Hx   . Prostate cancer Neg Hx     Social History:  reports that he quit smoking about 5 years ago. His smoking use included cigarettes. He started smoking about 55 years ago. He smoked 1.50 packs per day. he has never used smokeless tobacco. He reports that he drinks about 8.4 oz of alcohol per week. He reports that he uses drugs. Drug: Marijuana.  ROS: UROLOGY Frequent Urination?: No Hard to postpone urination?: Yes Burning/pain with urination?: No Get up at night to urinate?: Yes Leakage of urine?: Yes Urine stream starts and stops?: No Trouble starting stream?: No Do you have to strain to urinate?: No Blood in urine?: No Urinary tract infection?: No Sexually transmitted disease?: No Injury to kidneys or bladder?: No Painful intercourse?: No Weak stream?: No Erection problems?: Yes Penile pain?: No  Gastrointestinal Nausea?: No Vomiting?: No Indigestion/heartburn?: Yes Diarrhea?: No Constipation?: No  Constitutional Fever: No Night sweats?:  No Weight loss?: No Fatigue?: No  Skin Skin rash/lesions?: No Itching?: No  Eyes Blurred vision?: No Double vision?: No  Ears/Nose/Throat Sore throat?: No Sinus problems?: No  Hematologic/Lymphatic Swollen glands?: No Easy bruising?: Yes  Cardiovascular Leg swelling?: No Chest pain?: No  Respiratory Cough?: No Shortness of breath?: Yes  Endocrine Excessive thirst?: No  Musculoskeletal Back pain?: Yes Joint pain?: No  Neurological Headaches?: No  Psychologic Depression?: Yes Anxiety?: Yes  Physical Exam: BP 111/74   Pulse 86   Ht 5\' 10"  (1.778 m)   Wt 270 lb (122.5 kg)   BMI 38.74 kg/m   Constitutional: Well nourished. Alert and oriented, No acute distress. HEENT: [place] AT, moist mucus membranes. Trachea midline, no masses.  Cardiovascular: No clubbing, cyanosis, or edema. Respiratory: Normal respiratory effort, no increased work of breathing. GI: Abdomen is soft, non tender, non distended, no abdominal masses. Liver and spleen not palpable.  No hernias appreciated.  Stool sample for occult testing is not indicated.   GU: No CVA tenderness.  No bladder fullness or masses.  Patient with uncircumcised phallus.   Foreskin easily retracted   Urethral meatus is patent.  No penile discharge. No penile lesions or rashes. Scrotum without lesions, cysts, rashes and/or edema.  Testicles are located scrotally bilaterally. No masses are appreciated in the testicles. Left and right epididymis are normal.  No tenderness when palpating the testicles or epididymis bilaterally.  Cremasteric reflex intact.   Rectal: Deferred.  Skin: No rashes, bruises or suspicious lesions. Lymph: No cervical or inguinal adenopathy. Neurologic: Grossly intact, no focal deficits, moving all 4 extremities. Psychiatric: Normal mood and affect.  Laboratory Data: Lab Results  Component Value Date   WBC 7.8 04/08/2016   HGB 13.4 04/08/2016   HCT 40.3 04/08/2016   MCV 98.8 04/08/2016   PLT 260  04/08/2016    Lab Results  Component Value Date   CREATININE 1.10 11/08/2015    Lab Results  Component Value Date   PSA 1.74 10/02/2016    No results found for: TESTOSTERONE  No results found for: HGBA1C  No results found for: TSH  No results found for: CHOL, HDL, CHOLHDL, VLDL, LDLCALC  Lab Results  Component Value Date   AST 47 (H) 07/27/2014   Lab Results  Component Value Date   ALT 23 07/27/2014   No components found for: ALKALINEPHOPHATASE No components found for: BILIRUBINTOTAL  No results found for: ESTRADIOL  Urinalysis    Component Value Date/Time   COLORURINE YELLOW 07/27/2014 0236   APPEARANCEUR Hazy (A) 08/08/2016 1151   LABSPEC 1.015 07/27/2014 0236   PHURINE 5.0 07/27/2014 0236   GLUCOSEU Negative 08/08/2016 1151   HGBUR TRACE (A) 07/27/2014 0236   BILIRUBINUR Negative 08/08/2016 1151   KETONESUR NEGATIVE 07/27/2014 0236   PROTEINUR 1+ (A) 08/08/2016 1151   PROTEINUR 100 (A) 07/27/2014 0236   UROBILINOGEN 0.2 07/27/2014 0236   NITRITE Negative 08/08/2016 1151   NITRITE NEGATIVE 07/27/2014 0236   LEUKOCYTESUR Negative 08/08/2016 1151    I have reviewed the labs.  Assessment & Plan:    1. Scrotal pain  -small left varicocele noted on [place]  - advised the patient to wear supportive underwear and/or an athletic supporter and avoid straining  - RTC to clinic if symptoms worsen  2. Prostate cancer [facility]) cT1c prostate cancer, PSA 3.8, Gleason 3+3 in 7/12 cores, relatively high volume LOW RISK diease S/p I-125 brachytherapy 03/2016 PSA down to 0.88, will continue to follow PSA q6 months (10/2017)  3. Urge incontinence Continue Mybetriq 50 mg and flomax 0.4 mg Currently satisfied with voiding  4. Erectile dysfunction, unspecified erectile dysfunction type Severe, no treatment desired at this time  Return for keep lab appointment on 04/12 and office visit on 04/16.  These notes generated with voice recognition software. I apologize for  typographical errors.  Zara Council, [facility] [address] [place], [place] [address] [phone]

## 2017-11-05 ENCOUNTER — Other Ambulatory Visit: Payer: Medicare HMO

## 2017-11-05 DIAGNOSIS — C61 Malignant neoplasm of prostate: Secondary | ICD-10-CM

## 2017-11-06 ENCOUNTER — Other Ambulatory Visit: Payer: Medicare HMO

## 2017-11-06 LAB — PSA: Prostate Specific Ag, Serum: 0.6 ng/mL (ref 0.0–4.0)

## 2017-11-10 ENCOUNTER — Ambulatory Visit: Payer: PPO | Admitting: Urology

## 2017-11-10 ENCOUNTER — Ambulatory Visit: Payer: Medicare HMO | Admitting: Urology

## 2017-11-10 ENCOUNTER — Encounter: Payer: Self-pay | Admitting: Urology

## 2017-11-10 VITALS — BP 111/67 | HR 97 | Ht 72.0 in | Wt 270.0 lb

## 2017-11-10 DIAGNOSIS — C61 Malignant neoplasm of prostate: Secondary | ICD-10-CM | POA: Diagnosis not present

## 2017-11-10 DIAGNOSIS — M79652 Pain in left thigh: Secondary | ICD-10-CM | POA: Diagnosis not present

## 2017-11-10 DIAGNOSIS — N3941 Urge incontinence: Secondary | ICD-10-CM | POA: Diagnosis not present

## 2017-11-10 MED ORDER — MIRABEGRON ER 50 MG PO TB24: 50.0000 mg | ORAL_TABLET | Freq: Every day | ORAL | 3 refills | Status: DC

## 2017-11-10 NOTE — Progress Notes (Signed)
11/10/2017 3:00 PM   Christopher Orr 13-Apr-1946 [id number]  Referring provider: Jodi Marble, MD [place], [place] [address]  Chief Complaint  Patient presents with  . Prostate Cancer    17month    HPI: 72 year old male with history of prostate cancer, BPH with severe LUTS, and ED.  Today, he is particularly concerned about left inner thigh pain particularly with standing and ambulating.  He has not yet seen his primary care physician.  He was seen and evaluated by Zara Council as the pain occasionally radiated into his left testicle.  Ultrasound and examination of the testicle was unremarkable.  He is equesting narcotics for his pain.  History of prostate cancer He was dx with low risk Gleason 3+3 prostate cancer s/p brachytherapy seed implant 04/22/16 with I-125 seeds.   He was dx after rise in his PSA up to 3.8 on 12/20/15 up from 3.2 on 09/24/15, and 2.2 the previous year s/p prostate biopsy on 01/15/16 demonstrating prostate cancer, T1c, Gleason 3+3 involving 7/12 cores including all cores on left and single core at right apex up to 70% tissue.  TRUS volume 42 cc.  Most recent PSA now down to 0.6 which appears to be his nadir.  BPH with LUTS/ urinary frequency He does also have baseline lower urinary tract symptoms including urinary urgency and urge incontinence. He is currently on Mybetriq 50 mg and flomax 0.4 mg.     Over the past few weeks, he is run out of Myrbetriq medication.  He notes significant worsening of his urinary symptoms including urgency, frequency, incontinence and nocturia.  He is much better when on this medication.  He is anxious to have this refilled today.  No gross hematuria or dysuria.    ED Baseline severe ED, unable to achieve or maintain an erection.     PMH: Past Medical History:  Diagnosis Date  . Alcohol abuse   . Anemia   . BPH (benign prostatic hypertrophy) with urinary obstruction   . CAD (coronary artery disease)    a. s/p stent to LAD in 2006 at [facility], EF 60% at that time.  . Cancer [facility])    prostate  . Chronic pancreatitis ([place])   . COPD (chronic obstructive pulmonary disease) ([place])   . Depression   . Diverticulosis   . Drug use   . ED (erectile dysfunction)   . GERD (gastroesophageal reflux disease)   . Hepatitis C   . History of lumbar fusion   . Hyperlipidemia   . Hypertension   . Mitral regurgitation   . Urge incontinence     Surgical History: Past Surgical History:  Procedure Laterality Date  . ESOPHAGOGASTRODUODENOSCOPY N/A 12/22/2014   Procedure: ESOPHAGOGASTRODUODENOSCOPY (EGD);  Surgeon: [name], MD;  Location: [facility] ENDOSCOPY;  Service: Endoscopy;  Laterality: N/A;  . LUMBAR FUSION    . RADIOACTIVE SEED IMPLANT N/A 04/22/2016   Procedure: RADIOACTIVE SEED IMPLANT/BRACHYTHERAPY IMPLANT;  Surgeon: Hollice Espy, MD;  Location: [facility] ORS;  Service: Urology;  Laterality: N/A;  . TONSILLECTOMY      Home Medications:  Allergies as of 11/10/2017   No Known Allergies     Medication List        Accurate as of 11/10/17  3:00 PM. Always use your most recent med list.          albuterol (2.5 MG/3ML) 0.083% nebulizer solution Commonly known as:  PROVENTIL USE TWICE A DAY PER NEBULIZER   ALPRAZolam 1 MG tablet Commonly known  as:  XANAX   alprazolam 2 MG tablet Commonly known as:  XANAX Take 2 mg by mouth at bedtime.   ARIPiprazole 10 MG tablet Commonly known as:  ABILIFY Take 10 mg by mouth daily.   aspirin 81 MG tablet Take 81 mg by mouth daily. Reported on 01/15/2016   celecoxib 400 MG capsule Commonly known as:  CELEBREX Take 400 mg by mouth daily after breakfast.   clopidogrel 75 MG tablet Commonly known as:  PLAVIX Take 75 mg by mouth daily. Reported on 01/15/2016   cyclobenzaprine 5 MG tablet Commonly known as:  FLEXERIL Take 1 tablet (5 mg total) by mouth 3 (three) times daily as needed for muscle spasms.   docusate sodium 100 MG capsule Commonly  known as:  COLACE Take 1 capsule (100 mg total) by mouth 2 (two) times daily.   escitalopram 10 MG tablet Commonly known as:  LEXAPRO   FLUoxetine HCl 60 MG Tabs Take 1 tablet by mouth daily.   folic acid 1 MG tablet Commonly known as:  FOLVITE Take 1 mg by mouth daily.   ibuprofen 600 MG tablet Commonly known as:  ADVIL,MOTRIN   metoprolol succinate 25 MG 24 hr tablet Commonly known as:  TOPROL-XL   mirabegron ER 50 MG Tb24 tablet Commonly known as:  MYRBETRIQ Take 1 tablet (50 mg total) by mouth daily.   multivitamin with minerals tablet Take 1 tablet by mouth daily.   pantoprazole 40 MG tablet Commonly known as:  PROTONIX Take 40 mg by mouth daily. Reported on 09/14/2015   rosuvastatin 10 MG tablet Commonly known as:  CRESTOR Take 10 mg by mouth daily.   SPIRIVA RESPIMAT 2.5 MCG/ACT Aers Generic drug:  Tiotropium Bromide Monohydrate   tamsulosin 0.4 MG Caps capsule Commonly known as:  FLOMAX Take 1 capsule (0.4 mg total) by mouth daily after supper.   traZODone 150 MG tablet Commonly known as:  DESYREL Take 150 mg by mouth at bedtime.       Allergies: No Known Allergies  Family History: Family History  Problem Relation Age of Onset  . Heart disease Father        Unclear details. Father passed in late 14's 2/2 cancer.  . Colon cancer Father   . Kidney disease Neg Hx   . Prostate cancer Neg Hx     Social History:  reports that he quit smoking about 5 years ago. His smoking use included cigarettes. He started smoking about 55 years ago. He smoked 1.50 packs per day. He has never used smokeless tobacco. He reports that he drinks about 8.4 oz of alcohol per week. He reports that he has current or past drug history. Drug: Marijuana.  ROS: UROLOGY Frequent Urination?: No Hard to postpone urination?: Yes Burning/pain with urination?: No Get up at night to urinate?: Yes Leakage of urine?: Yes Urine stream starts and stops?: No Trouble starting stream?:  No Do you have to strain to urinate?: No Blood in urine?: No Urinary tract infection?: No Sexually transmitted disease?: No Injury to kidneys or bladder?: No Painful intercourse?: No Weak stream?: No Erection problems?: Yes Penile pain?: No  Gastrointestinal Nausea?: No Vomiting?: No Indigestion/heartburn?: Yes Diarrhea?: No Constipation?: No  Constitutional Fever: No Night sweats?: No Weight loss?: No Fatigue?: Yes  Skin Skin rash/lesions?: Yes Itching?: No  Eyes Blurred vision?: No Double vision?: No  Ears/Nose/Throat Sore throat?: No Sinus problems?: Yes  Hematologic/Lymphatic Swollen glands?: No Easy bruising?: Yes  Cardiovascular Leg swelling?: No Chest pain?: No  Respiratory Cough?: No Shortness of breath?: Yes  Endocrine Excessive thirst?: No  Musculoskeletal Back pain?: Yes Joint pain?: Yes  Neurological Headaches?: No Dizziness?: No  Psychologic Depression?: Yes Anxiety?: No  Physical Exam: BP 111/67   Pulse 97   Ht 6' (1.829 m)   Wt 270 lb (122.5 kg)   BMI 36.62 kg/m   Constitutional:  Alert and oriented, No acute distress. HEENT: [place] AT, moist mucus membranes.  Trachea midline, no masses. Cardiovascular: No clubbing, cyanosis, or edema. Respiratory: Normal respiratory effort, no increased work of breathing. GI: Abdomen is soft, nontender, nondistended, no abdominal masses.   Skin: No rashes, bruises or suspicious lesions. Neurologic: Grossly intact, no focal deficits, moving all 4 extremities. Psychiatric: Normal mood and affect.  Laboratory Data: Lab Results  Component Value Date   WBC 7.8 04/08/2016   HGB 13.4 04/08/2016   HCT 40.3 04/08/2016   MCV 98.8 04/08/2016   PLT 260 04/08/2016    Lab Results  Component Value Date   CREATININE 1.10 11/08/2015    Results for orders placed or performed in visit on 11/05/17  PSA  Result Value Ref Range   Prostate Specific Ag, Serum 0.6 0.0 - 4.0 ng/mL    Assessment &  Plan:    1. Prostate cancer [facility]) cT1c prostate cancer, PSA 3.8, Gleason 3+3 in 7/12 cores, relatively high volume LOW RISK diease S/p I-125 brachytherapy 03/2016 PSA down to 0.6, will continue to follow PSA q6 months   2. Urge incontinence Continue Mybetriq 50 mg and flomax 0.4 mg  3. Left tigh pain Advised etiology is unclear and not my area of expertise I recommend he follow-up with his PCP and he has appointment on Friday   Return in about 6 months (around 05/12/2018) for PSA, PVR, IPSS.  Hollice Espy, MD  [facility] [address] [place], [place] [address] [phone]

## 2017-11-24 ENCOUNTER — Encounter: Payer: Self-pay | Admitting: *Deleted

## 2017-12-25 ENCOUNTER — Encounter: Payer: Self-pay | Admitting: Psychiatry

## 2017-12-25 ENCOUNTER — Ambulatory Visit: Payer: Medicare HMO | Admitting: Psychiatry

## 2017-12-25 ENCOUNTER — Other Ambulatory Visit: Payer: Self-pay

## 2017-12-25 VITALS — BP 125/81 | HR 73 | Temp 97.6°F | Wt 261.4 lb

## 2017-12-25 DIAGNOSIS — F1320 Sedative, hypnotic or anxiolytic dependence, uncomplicated: Secondary | ICD-10-CM

## 2017-12-25 DIAGNOSIS — F410 Panic disorder [episodic paroxysmal anxiety] without agoraphobia: Secondary | ICD-10-CM

## 2017-12-25 DIAGNOSIS — F1210 Cannabis abuse, uncomplicated: Secondary | ICD-10-CM | POA: Diagnosis not present

## 2017-12-25 DIAGNOSIS — F331 Major depressive disorder, recurrent, moderate: Secondary | ICD-10-CM | POA: Diagnosis not present

## 2017-12-25 DIAGNOSIS — F411 Generalized anxiety disorder: Secondary | ICD-10-CM

## 2017-12-25 DIAGNOSIS — F4310 Post-traumatic stress disorder, unspecified: Secondary | ICD-10-CM | POA: Diagnosis not present

## 2017-12-25 DIAGNOSIS — F4000 Agoraphobia, unspecified: Secondary | ICD-10-CM

## 2017-12-25 MED ORDER — ALPRAZOLAM 1 MG PO TABS: 1.5000 mg | ORAL_TABLET | Freq: Every day | ORAL | 0 refills | Status: DC

## 2017-12-25 MED ORDER — FLUOXETINE HCL 60 MG PO TABS: 1.0000 | ORAL_TABLET | Freq: Every day | ORAL | 0 refills | Status: DC

## 2017-12-25 MED ORDER — TRAZODONE HCL 150 MG PO TABS
150.0000 mg | ORAL_TABLET | Freq: Every day | ORAL | 0 refills | Status: DC
Start: 2017-12-25 — End: 2018-06-14

## 2017-12-25 MED ORDER — ARIPIPRAZOLE 10 MG PO TABS: 5.0000 mg | ORAL_TABLET | Freq: Every day | ORAL | 0 refills | Status: DC

## 2017-12-25 MED ORDER — BUSPIRONE HCL 10 MG PO TABS: 10.0000 mg | ORAL_TABLET | Freq: Two times a day (BID) | ORAL | 0 refills | Status: DC

## 2017-12-25 NOTE — Progress Notes (Signed)
Psychiatric Initial Adult Assessment   Patient Identification: Christopher Orr MRN:  [id number] Date of Evaluation:  12/25/2017 Referral Source: Ahmed Elijio Miles MD  Chief Complaint:  ' I am here to establish care." Chief Complaint    Establish Care; Anxiety; Fatigue; Depression; Post-Traumatic Stress Disorder     Visit Diagnosis:    ICD-10-CM   1. GAD (generalized anxiety disorder) F41.1   2. PTSD (post-traumatic stress disorder) F43.10   3. Agoraphobia F40.00   4. Panic attacks F41.0   5. Cannabis use disorder, mild, abuse F12.10   6. MDD (major depressive disorder), recurrent episode, moderate (HCC) F33.1   7. Benzodiazepine dependence (HCC) F13.20     History of Present Illness:  Christopher Orr is a 72 year old male, single, lives in [place], has a history of anxiety disorder, PTSD, agoraphobia, COPD, history of prostate cancer, chronic pain, presented to the clinic today to establish care.  Patient reports he used to follow up with [facility] in the past however they are out of network and hence he was referred to this clinic.  Patient reports he has a history of anxiety, depression and is on medications like Prozac, Abilify, trazodone and Xanax.  Patient reports he continues to have depressive symptoms like sadness, hopelessness, anhedonia, little energy, trouble concentrating and so on.  Patient reports Prozac helps to some extent.  However he does not know if the Abilify is helpful.  He currently denies any side effects to any of these medications.  Patient reports he worries about everything in general.  He has trouble relaxing, is often found as on edge, easily annoyed, restless, feeling afraid something awful might happen and so on.  Patient also reports racing heart rate, chest pain, crawling sensation on his skin and so on when he worries too much.  He reports the symptoms can last for several hours.  He reports he tries to relax himself by walking and distracting.  Patient reports  he was incarcerated for more than 20 years for several legal charges.  He went to prison for the first time when he was 72 years old.  He reports he was also in federal prison and witnessed several traumatic events that.  He reports he witnessed people getting murdered.  He continues to have flashbacks, nightmares, intrusive memories, is often hypervigilant and alert due to these history of trauma.  Patient also reports agoraphobia.  Patient reports he stays withdrawn and usually does not go out of his house unless it is necessary.  Patient reports several psychosocial stressors.  Patient reports he is currently alienated by his family due to being incarcerated in the past, he does not have many friends, he is single and has multiple medical problems including recent prostate cancer.  He reports he continues to follow-up with his providers and currently his cancer is in remission.  Patient reports he smokes cannabis at least 3-4 times per week.  Patient reports he is not ready to quit since it helps him.  Associated Signs/Symptoms: Depression Symptoms:  depressed mood, insomnia, fatigue, feelings of worthlessness/guilt, difficulty concentrating, anxiety, panic attacks, (Hypo) Manic Symptoms:  denies Anxiety Symptoms:  Agoraphobia, Excessive Worry, Panic Symptoms, Psychotic Symptoms:  denies PTSD Symptoms: Had a traumatic exposure:  as noted above  Past Psychiatric History: Patient reports a history of being treated at [facility] until recently.  He also reports inpatient mental health admission at [facility] several years ago as well as Medical sales representative.  Patient denies any suicide attempts.  Previous Psychotropic Medications: Yes lexapro,  prozac, abilify, trazodone, xanax  Substance Abuse History in the last 12 months:  Yes.  -Cannabis -3-4 times per week since the past several years.  Consequences of Substance Abuse: Negative  Past Medical History:  Past Medical History:  Diagnosis Date  . Alcohol abuse    . Anemia   . Bipolar disorder ([place])   . BPH (benign prostatic hypertrophy) with urinary obstruction   . CAD (coronary artery disease)    a. s/p stent to LAD in 2006 at [facility], EF 60% at that time.  . Cancer [facility])    prostate  . CHF (congestive heart failure) ([place])   . Chronic pancreatitis ([place])   . COPD (chronic obstructive pulmonary disease) ([place])   . Depression   . Diverticulosis   . Drug use   . ED (erectile dysfunction)   . GERD (gastroesophageal reflux disease)   . Hepatitis C   . History of lumbar fusion   . Hyperlipidemia   . Hypertension   . Mitral regurgitation   . PTSD (post-traumatic stress disorder)   . Urge incontinence     Past Surgical History:  Procedure Laterality Date  . ESOPHAGOGASTRODUODENOSCOPY N/A 12/22/2014   Procedure: ESOPHAGOGASTRODUODENOSCOPY (EGD);  Surgeon: [name], MD;  Location[facility] ENDOSCOPY;  Service: Endoscopy;  Laterality: N/A;  . LUMBAR FUSION    . RADIOACTIVE SEED IMPLANT N/A 04/22/2016   Procedure: RADIOACTIVE SEED IMPLANT/BRACHYTHERAPY IMPLANT;  Surgeon: Hollice Espy, MD;  Location: [facility] ORS;  Service: Urology;  Laterality: N/A;  . TONSILLECTOMY      Family Psychiatric History: Patient reports his sister had a history of depression, attempted suicide.  She is now deceased.  Family History:  Family History  Problem Relation Age of Onset  . Heart disease Father        Unclear details. Father passed in late 68's 2/2 cancer.  . Colon cancer Father   . Alcohol abuse Sister   . Drug abuse Sister   . Anxiety disorder Sister   . Depression Sister   . Kidney disease Neg Hx   . Prostate cancer Neg Hx     Social History:   Social History   Socioeconomic History  . Marital status: Divorced    Spouse name: Not on file  . Number of children: 4  . Years of education: Not on file  . Highest education level: GED or equivalent  Occupational History    Comment: retired  Scientific laboratory technician  . Financial resource strain: Hard  .  Food insecurity:    Worry: Often true    Inability: Often true  . Transportation needs:    Medical: Yes    Non-medical: Yes  Tobacco Use  . Smoking status: Former Smoker    Packs/day: 1.50    Types: Cigarettes    Start date: 07/28/1962    Last attempt to quit: 07/28/2012    Years since quitting: 5.4  . Smokeless tobacco: Never Used  Substance and Sexual Activity  . Alcohol use: Yes    Alcohol/week: 3.6 - 8.4 oz    Types: 6 - 14 Cans of beer per week    Comment: 6pack and fifth of liquor each day  . Drug use: Yes    Types: Marijuana    Comment: Endorsed heroin 06/2014, UDS also + benzos, opiates, THC, neg for cocaine at that time.  Marland Kitchen Sexual activity: Not Currently  Lifestyle  . Physical activity:    Days per week: 0 days    Minutes per session: 0 min  .  Stress: Very much  Relationships  . Social connections:    Talks on phone: More than three times a week    Gets together: Once a week    Attends religious service: Never    Active member of club or organization: Yes    Attends meetings of clubs or organizations: More than 4 times per year    Relationship status: Divorced  Other Topics Concern  . Not on file  Social History Narrative  . Not on file    Additional Social History: Patient is single.  He lives in [place].  He reports he has 4 children altogether and 2 grandchildren and great grandchildren.  Patient however reports he has support system from his 69 year old daughter otherwise everyone else in his family has alienated him.  He is on SSI.  He has history of being  incarcerated for more than 20 years.  Allergies:  No Known Allergies  Metabolic Disorder Labs: No results found for: HGBA1C, MPG No results found for: PROLACTIN No results found for: CHOL, TRIG, HDL, CHOLHDL, VLDL, LDLCALC   Current Medications: Current Outpatient Medications  Medication Sig Dispense Refill  . albuterol (PROVENTIL) (2.5 MG/3ML) 0.083% nebulizer solution USE TWICE A DAY PER  NEBULIZER  0  . ARIPiprazole (ABILIFY) 10 MG tablet Take 0.5 tablets (5 mg total) by mouth daily. 45 tablet 0  . aspirin 81 MG tablet Take 81 mg by mouth daily. Reported on 01/15/2016    . celecoxib (CELEBREX) 400 MG capsule Take 400 mg by mouth daily after breakfast.    . clopidogrel (PLAVIX) 75 MG tablet Take 75 mg by mouth daily. Reported on 01/15/2016  5  . cyclobenzaprine (FLEXERIL) 5 MG tablet Take 1 tablet (5 mg total) by mouth 3 (three) times daily as needed for muscle spasms. 16 tablet 0  . docusate sodium (COLACE) 100 MG capsule Take 1 capsule (100 mg total) by mouth 2 (two) times daily. 60 capsule 0  . FLUoxetine HCl 60 MG TABS Take 1 tablet by mouth daily. 90 tablet 0  . folic acid (FOLVITE) 1 MG tablet Take 1 mg by mouth daily.    Marland Kitchen ibuprofen (ADVIL,MOTRIN) 600 MG tablet     . metoprolol succinate (TOPROL-XL) 25 MG 24 hr tablet     . mirabegron ER (MYRBETRIQ) 50 MG TB24 tablet Take 1 tablet (50 mg total) by mouth daily. 90 tablet 3  . Multiple Vitamins-Minerals (MULTIVITAMIN WITH MINERALS) tablet Take 1 tablet by mouth daily.    . pantoprazole (PROTONIX) 40 MG tablet Take 40 mg by mouth daily. Reported on 09/14/2015  0  . rosuvastatin (CRESTOR) 10 MG tablet Take 10 mg by mouth daily.  1  . SPIRIVA RESPIMAT 2.5 MCG/ACT AERS     . tamsulosin (FLOMAX) 0.4 MG CAPS capsule Take 1 capsule (0.4 mg total) by mouth daily after supper. 30 capsule 12  . traZODone (DESYREL) 150 MG tablet Take 1 tablet (150 mg total) by mouth at bedtime. 90 tablet 0  . ALPRAZolam (XANAX) 1 MG tablet Take 1.5 tablets (1.5 mg total) by mouth at bedtime. 45 tablet 0  . busPIRone (BUSPAR) 10 MG tablet Take 1 tablet (10 mg total) by mouth 2 (two) times daily. 180 tablet 0   No current facility-administered medications for this visit.     Neurologic: Headache: No Seizure: No Paresthesias:No  Musculoskeletal: Strength & Muscle Tone: within normal limits Gait & Station: normal Patient leans: N/A  Psychiatric  Specialty Exam: Review of Systems  Psychiatric/Behavioral: Positive for  depression and substance abuse. The patient is nervous/anxious.   All other systems reviewed and are negative.   Blood pressure 125/81, pulse 73, temperature 97.6 F (36.4 C), temperature source Oral, weight 261 lb 6.4 oz (118.6 kg).Body mass index is 35.45 kg/m.  General Appearance: Casual  Eye Contact:  Fair  Speech:  Clear and Coherent  Volume:  Normal  Mood:  Anxious and Dysphoric  Affect:  Congruent  Thought Process:  Goal Directed and Descriptions of Associations: Intact  Orientation:  Full (Time, Place, and Person)  Thought Content:  Logical  Suicidal Thoughts:  No  Homicidal Thoughts:  No  Memory:  Immediate;   Fair Recent;   Fair Remote;   Fair  Judgement:  Fair  Insight:  Fair  Psychomotor Activity:  Normal  Concentration:  Concentration: Fair and Attention Span: Fair  Recall:  [employer] of Knowledge:Fair  Language: Fair  Akathisia:  No  Handed:  Right  AIMS (if indicated):  Denies tremors, rigidity, stiffness  Assets:  Communication Skills Desire for Improvement Social Support  ADL's:  Intact  Cognition: WNL  Sleep:  restless    Treatment Plan Summary:Jamyron is a 72 year old Caucasian male, on SSI, single, lives in [place], has a history of depression, anxiety, PTSD, agoraphobia, history of prostate cancer, COPD, chronic pain, presented to the clinic today to establish care.  Patient is biologically predisposed given his history of trauma as well as family history of mental health problems,his own hx of substance abuse issues.  Patient also has several psychosocial stresses including his history of being incarcerated in prison, limited social support, multiple medical problems and so on.  Patient reports he is motivated to stay on treatment. Medication management and Plan as noted below Plan  For MDD Continue Prozac 60 mg p.o. daily, patient reports it is effective Reduce Abilify to 5  mg p.o. daily Add BuSpar 10 mg p.o. twice daily PHQ 9 equals 15  For anxiety symptoms Continue Prozac as prescribed Add BuSpar 10 mg p.o. twice daily Discussed with patient about reducing Xanax to 1.5 mg p.o. nightly.  Patient was being prescribed 2 mg at bedtime.  For PTSD Prozac 60 mg p.o. daily Add BuSpar 10 mg p.o. twice daily  For insomnia Continue trazodone 150 mg p.o. nightly.  Patient reports he has been taking only half of 150 and is aware that he can take 1 whole tablet if he needs it.  For benzodiazepine dependence-prescribed Discussed with patient about the risk of being on benzodiazepine therapy for long-term. Will taper patient off gradually.  He will start taking Xanax 1.5 p.o. nightly. I have reviewed [place] controlled substance database.  For agoraphobia Patient will benefit from psychotherapy.  For cannabis abuse Provided substance abuse counseling.  I have also reviewed medical records from [place].  Follow-up in clinic in 1 month or sooner if needed.  More than 50 % of the time was spent for psychoeducation and supportive psychotherapy and care coordination.  This note was generated in part or whole with voice recognition software. Voice recognition is usually quite accurate but there are transcription errors that can and very often do occur. I apologize for any typographical errors that were not detected and corrected.      Ursula Alert, MD 5/31/201912:40 PM

## 2017-12-25 NOTE — Patient Instructions (Signed)
Buspirone tablets What is this medicine? BUSPIRONE (byoo SPYE rone) is used to treat anxiety disorders. This medicine may be used for other purposes; ask your health care provider or pharmacist if you have questions. COMMON BRAND NAME(S): BuSpar What should I tell my health care provider before I take this medicine? They need to know if you have any of these conditions: -kidney or liver disease -an unusual or allergic reaction to buspirone, other medicines, foods, dyes, or preservatives -pregnant or trying to get pregnant -breast-feeding How should I use this medicine? Take this medicine by mouth with a glass of water. Follow the directions on the prescription label. You may take this medicine with or without food. To ensure that this medicine always works the same way for you, you should take it either always with or always without food. Take your doses at regular intervals. Do not take your medicine more often than directed. Do not stop taking except on the advice of your doctor or health care professional. Talk to your pediatrician regarding the use of this medicine in children. Special care may be needed. Overdosage: If you think you have taken too much of this medicine contact a poison control center or emergency room at once. NOTE: This medicine is only for you. Do not share this medicine with others. What if I miss a dose? If you miss a dose, take it as soon as you can. If it is almost time for your next dose, take only that dose. Do not take double or extra doses. What may interact with this medicine? Do not take this medicine with any of the following medications: -linezolid -MAOIs like Carbex, Eldepryl, Marplan, Nardil, and Parnate -methylene blue -procarbazine This medicine may also interact with the following medications: -diazepam -digoxin -diltiazem -erythromycin -grapefruit juice -haloperidol -medicines for mental depression or mood problems -medicines for seizures like  carbamazepine, phenobarbital and phenytoin -nefazodone -other medications for anxiety -rifampin -ritonavir -some antifungal medicines like itraconazole, ketoconazole, and voriconazole -verapamil -warfarin This list may not describe all possible interactions. Give your health care provider a list of all the medicines, herbs, non-prescription drugs, or dietary supplements you use. Also tell them if you smoke, drink alcohol, or use illegal drugs. Some items may interact with your medicine. What should I watch for while using this medicine? Visit your doctor or health care professional for regular checks on your progress. It may take 1 to 2 weeks before your anxiety gets better. You may get drowsy or dizzy. Do not drive, use machinery, or do anything that needs mental alertness until you know how this drug affects you. Do not stand or sit up quickly, especially if you are an older patient. This reduces the risk of dizzy or fainting spells. Alcohol can make you more drowsy and dizzy. Avoid alcoholic drinks. What side effects may I notice from receiving this medicine? Side effects that you should report to your doctor or health care professional as soon as possible: -blurred vision or other vision changes -chest pain -confusion -difficulty breathing -feelings of hostility or anger -muscle aches and pains -numbness or tingling in hands or feet -ringing in the ears -skin rash and itching -vomiting -weakness Side effects that usually do not require medical attention (report to your doctor or health care professional if they continue or are bothersome): -disturbed dreams, nightmares -headache -nausea -restlessness or nervousness -sore throat and nasal congestion -stomach upset This list may not describe all possible side effects. Call your doctor for medical advice about side   effects. You may report side effects to FDA at 1-800-FDA-1088. Where should I keep my medicine? Keep out of the reach  of children. Store at room temperature below 30 degrees C (86 degrees F). Protect from light. Keep container tightly closed. Throw away any unused medicine after the expiration date. NOTE: This sheet is a summary. It may not cover all possible information. If you have questions about this medicine, talk to your doctor, pharmacist, or health care provider.  2018 Elsevier/Gold Standard (2010-02-21 18:06:11)

## 2018-01-21 ENCOUNTER — Other Ambulatory Visit: Payer: Self-pay | Admitting: Psychiatry

## 2018-01-25 ENCOUNTER — Encounter: Payer: Self-pay | Admitting: Psychiatry

## 2018-01-25 ENCOUNTER — Ambulatory Visit: Payer: Medicare HMO | Admitting: Psychiatry

## 2018-01-25 ENCOUNTER — Other Ambulatory Visit: Payer: Self-pay

## 2018-01-25 VITALS — BP 96/67 | HR 85 | Temp 97.9°F | Wt 264.6 lb

## 2018-01-25 DIAGNOSIS — F331 Major depressive disorder, recurrent, moderate: Secondary | ICD-10-CM

## 2018-01-25 DIAGNOSIS — F1210 Cannabis abuse, uncomplicated: Secondary | ICD-10-CM

## 2018-01-25 DIAGNOSIS — F410 Panic disorder [episodic paroxysmal anxiety] without agoraphobia: Secondary | ICD-10-CM | POA: Diagnosis not present

## 2018-01-25 DIAGNOSIS — F4310 Post-traumatic stress disorder, unspecified: Secondary | ICD-10-CM | POA: Diagnosis not present

## 2018-01-25 DIAGNOSIS — F411 Generalized anxiety disorder: Secondary | ICD-10-CM | POA: Diagnosis not present

## 2018-01-25 DIAGNOSIS — F1320 Sedative, hypnotic or anxiolytic dependence, uncomplicated: Secondary | ICD-10-CM

## 2018-01-25 DIAGNOSIS — F4000 Agoraphobia, unspecified: Secondary | ICD-10-CM

## 2018-01-25 MED ORDER — ALPRAZOLAM 1 MG PO TABS: 1.5000 mg | ORAL_TABLET | Freq: Every day | ORAL | 1 refills | Status: DC

## 2018-01-25 NOTE — Progress Notes (Signed)
[place] MD OP Progress Note  01/25/2018 3:29 PM BRIGG CAPE  MRN:  [id number]  Chief Complaint: ' I am here for follow up.' Chief Complaint    Follow-up; Medication Refill     HPI: Christopher Orr is a 72 yr old Caucasian male, single, lives in [place], has a history of anxiety disorder, PTSD, agoraphobia, COPD, history of prostate cancer, chronic pain, presented to the clinic today for a follow-up visit.  This is patient's second visit with Probation officer.  Patient today reports he is doing well on the current medication regimen.  He reports he is taking his Prozac, Abilify, trazodone and Xanax as prescribed.  He denies any significant mood symptoms.  Patient however reports he does not want anyone touching his Xanax.  He reports he wants to stay on the Xanax as it is.  Discussed with patient that he should not be on a high dose of Xanax as prescribed given his age and also the cognitive side effect profile.  This was all discussed with patient during his last visit.  Patient was given handouts/educational materials last visit.  Patient however today is adamant that he wants to stay on the Xanax and that he would rather find another provider who can prescribe it to him.Pt reports he does not want it to be weaned off , even though it was discussed that it can be done gradually.  Patient denies any suicidality.  Patient denies any perceptual disturbances.  Patient reports he continues to smoke cannabis on a regular basis.  Pt is not ready to quit since it helps him.   Visit Diagnosis:    ICD-10-CM   1. GAD (generalized anxiety disorder) F41.1   2. PTSD (post-traumatic stress disorder) F43.10   3. Panic attacks F41.0   4. MDD (major depressive disorder), recurrent episode, moderate (HCC) F33.1   5. Cannabis use disorder, mild, abuse F12.10   6. Benzodiazepine dependence (HCC) F13.20   7. Agoraphobia F40.00     Past Psychiatric History: Reviewed past psychiatric history from my progress note on  12/25/2017.  Past trials of Lexapro, Prozac, Abilify, trazodone, Xanax.  Past Medical History:  Past Medical History:  Diagnosis Date  . Alcohol abuse   . Anemia   . Bipolar disorder ([place])   . BPH (benign prostatic hypertrophy) with urinary obstruction   . CAD (coronary artery disease)    a. s/p stent to LAD in [facility] at [facility], EF 60% at that time.  . Cancer [facility])    prostate  . CHF (congestive heart failure) ([place])   . Chronic pancreatitis ([place])   . COPD (chronic obstructive pulmonary disease) ([place])   . Depression   . Diverticulosis   . Drug use   . ED (erectile dysfunction)   . GERD (gastroesophageal reflux disease)   . Hepatitis C   . History of lumbar fusion   . Hyperlipidemia   . Hypertension   . Mitral regurgitation   . PTSD (post-traumatic stress disorder)   . Urge incontinence     Past Surgical History:  Procedure Laterality Date  . ESOPHAGOGASTRODUODENOSCOPY N/A 12/22/2014   Procedure: ESOPHAGOGASTRODUODENOSCOPY (EGD);  Surgeon: [name], MD;  Location: [facility] ENDOSCOPY;  Service: Endoscopy;  Laterality: N/A;  . LUMBAR FUSION    . RADIOACTIVE SEED IMPLANT N/A 04/22/2016   Procedure: RADIOACTIVE SEED IMPLANT/BRACHYTHERAPY IMPLANT;  Surgeon: Hollice Espy, MD;  Location: [facility] ORS;  Service: Urology;  Laterality: N/A;  . TONSILLECTOMY      Family Psychiatric History: Reviewed family psychiatric  history from my progress note on 12/25/2017.  Family History:  Family History  Problem Relation Age of Onset  . Heart disease Father        Unclear details. Father passed in late 52's 2/2 cancer.  . Colon cancer Father   . Alcohol abuse Sister   . Drug abuse Sister   . Anxiety disorder Sister   . Depression Sister   . Kidney disease Neg Hx   . Prostate cancer Neg Hx    Substance abuse history: Smokes cannabis 3-4 times per week.  Has been using it since the past several years.  Social History: Reviewed social history from my progress note on 12/25/2017. Social  History   Socioeconomic History  . Marital status: Divorced    Spouse name: Not on file  . Number of children: 4  . Years of education: Not on file  . Highest education level: GED or equivalent  Occupational History    Comment: retired  Scientific laboratory technician  . Financial resource strain: Hard  . Food insecurity:    Worry: Often true    Inability: Often true  . Transportation needs:    Medical: Yes    Non-medical: Yes  Tobacco Use  . Smoking status: Former Smoker    Packs/day: 1.50    Types: Cigarettes    Start date: 07/28/1962    Last attempt to quit: 07/28/2012    Years since quitting: 5.4  . Smokeless tobacco: Never Used  Substance and Sexual Activity  . Alcohol use: Yes    Alcohol/week: 3.6 - 8.4 oz    Types: 6 - 14 Cans of beer per week    Comment: 6pack and fifth of liquor each day  . Drug use: Yes    Types: Marijuana    Comment: Endorsed heroin 06/2014, UDS also + benzos, opiates, THC, neg for cocaine at that time.  Marland Kitchen Sexual activity: Not Currently  Lifestyle  . Physical activity:    Days per week: 0 days    Minutes per session: 0 min  . Stress: Very much  Relationships  . Social connections:    Talks on phone: More than three times a week    Gets together: Once a week    Attends religious service: Never    Active member of club or organization: Yes    Attends meetings of clubs or organizations: More than 4 times per year    Relationship status: Divorced  Other Topics Concern  . Not on file  Social History Narrative  . Not on file    Allergies: No Known Allergies  Metabolic Disorder Labs: No results found for: HGBA1C, MPG No results found for: PROLACTIN No results found for: CHOL, TRIG, HDL, CHOLHDL, VLDL, LDLCALC No results found for: TSH  Therapeutic Level Labs: No results found for: LITHIUM No results found for: VALPROATE No components found for:  CBMZ  Current Medications: Current Outpatient Medications  Medication Sig Dispense Refill  . albuterol  (PROVENTIL) (2.5 MG/3ML) 0.083% nebulizer solution USE TWICE A DAY PER NEBULIZER  0  . ALPRAZolam (XANAX) 1 MG tablet Take 1.5 tablets (1.5 mg total) by mouth at bedtime. 45 tablet 1  . ARIPiprazole (ABILIFY) 10 MG tablet Take 0.5 tablets (5 mg total) by mouth daily. 45 tablet 0  . aspirin 81 MG tablet Take 81 mg by mouth daily. Reported on 01/15/2016    . busPIRone (BUSPAR) 10 MG tablet Take 1 tablet (10 mg total) by mouth 2 (two) times daily. 180 tablet 0  .  celecoxib (CELEBREX) 400 MG capsule Take 400 mg by mouth daily after breakfast.    . clopidogrel (PLAVIX) 75 MG tablet Take 75 mg by mouth daily. Reported on 01/15/2016  5  . cyclobenzaprine (FLEXERIL) 5 MG tablet Take 1 tablet (5 mg total) by mouth 3 (three) times daily as needed for muscle spasms. 16 tablet 0  . docusate sodium (COLACE) 100 MG capsule Take 1 capsule (100 mg total) by mouth 2 (two) times daily. 60 capsule 0  . FLUoxetine HCl 60 MG TABS Take 1 tablet by mouth daily. 90 tablet 0  . folic acid (FOLVITE) 1 MG tablet Take 1 mg by mouth daily.    Marland Kitchen ibuprofen (ADVIL,MOTRIN) 600 MG tablet     . metoprolol succinate (TOPROL-XL) 25 MG 24 hr tablet     . mirabegron ER (MYRBETRIQ) 50 MG TB24 tablet Take 1 tablet (50 mg total) by mouth daily. 90 tablet 3  . Multiple Vitamins-Minerals (MULTIVITAMIN WITH MINERALS) tablet Take 1 tablet by mouth daily.    . pantoprazole (PROTONIX) 40 MG tablet Take 40 mg by mouth daily. Reported on 09/14/2015  0  . rosuvastatin (CRESTOR) 10 MG tablet Take 10 mg by mouth daily.  1  . SPIRIVA RESPIMAT 2.5 MCG/ACT AERS     . tamsulosin (FLOMAX) 0.4 MG CAPS capsule Take 1 capsule (0.4 mg total) by mouth daily after supper. 30 capsule 12  . traZODone (DESYREL) 150 MG tablet Take 1 tablet (150 mg total) by mouth at bedtime. 90 tablet 0   No current facility-administered medications for this visit.      Musculoskeletal: Strength & Muscle Tone: within normal limits Gait & Station: normal Patient leans:  N/A  Psychiatric Specialty Exam: Review of Systems  Psychiatric/Behavioral: The patient is nervous/anxious.   All other systems reviewed and are negative.   Blood pressure 96/67, pulse 85, temperature 97.9 F (36.6 C), temperature source Oral, weight 264 lb 9.6 oz (120 kg).Body mass index is 35.89 kg/m.  General Appearance: Casual  Eye Contact:  Fair  Speech:  Normal Rate  Volume:  Normal  Mood:  Anxious  Affect:  Appropriate  Thought Process:  Goal Directed and Descriptions of Associations: Intact  Orientation:  Full (Time, Place, and Person)  Thought Content: Logical   Suicidal Thoughts:  No  Homicidal Thoughts:  No  Memory:  Immediate;   Fair Recent;   Fair Remote;   Fair  Judgement:  Fair  Insight:  Fair  Psychomotor Activity:  Normal  Concentration:  Concentration: Fair and Attention Span: Fair  Recall:  [employer] of Knowledge: Fair  Language: Fair  Akathisia:  No  Handed:  Right  AIMS (if indicated): denies tremors, rigidity, stiffness  Assets:  Communication Skills Desire for Improvement  ADL's:  Intact  Cognition: WNL  Sleep:  Fair   Screenings: PHQ2-9     Follow Up  from 04/09/2017 in [place] Follow Up  from 10/09/2016 in [place]  PHQ-2 Total Score  0  0       Assessment and Plan: Ramses is a 72 year old Caucasian male, on SSI, single, lives in [place], has a history of depression, anxiety, PTSD, agoraphobia, history of prostate cancer, COPD chronic pain, presented to the clinic today for a follow-up visit.  Patient is biologically predisposed given his history of trauma as well as family history of mental health problems, he is on history of substance abuse issues.  Patient also has several  psychosocial stressors including his history of being incarcerated in prison, limited social support, multiple medical problems and so on.  Patient today reports he is currently doing  well on the current medication regimen.  He reports he does not want any medication readjustment.  He also reports he does not want anyone touching his Xanax since he has been on it since the past several years.  Patient reports he would rather find another provider who can prescribe it to him and is not comfortable being weaned off of. Plan For MDD Continue Prozac 60 mg p.o. daily Continue Abilify 5 mg p.o. daily Continue BuSpar 10 mg p.o. twice daily.  Anxiety symptoms Continue Prozac and BuSpar as prescribed Patient was asked to take a reduced dose of Xanax 1.5 mg at bedtime during his last visit.  Patient reports he does not want anyone touching his Xanax and wants to find another provider.  Hence discussed giving him 74-month supply for his medications.  This will give him enough time to find another provider.  For PTSD Prozac and BuSpar as prescribed  For benzodiazepine dependence-prescribed Provided education about being on benzodiazepine therapy for long-term.  Patient was given Scientist, clinical (histocompatibility and immunogenetics). I have reviewed [place] controlled substance database. We will continue Xanax 1.5 mg p.o. nightly, provided 66-month supply.  For agoraphobia Patient will benefit from psychotherapy.  Patient however declines therapy referral.  For cannabis abuse Patient not ready to quit.  As per discussion with patient, patient will find another provider.  Patient will be provided 16-month supply of all his medications.  Discussed with Janett Billow CMA who will get the termination letter ready to be mailed to patient.  More than 50 % of the time was spent for psychoeducation and supportive psychotherapy and care coordination.  This note was generated in part or whole with voice recognition software. Voice recognition is usually quite accurate but there are transcription errors that can and very often do occur. I apologize for any typographical errors that were not detected and  corrected.          Ursula Alert, MD 01/25/2018, 3:29 PM

## 2018-01-25 NOTE — Patient Instructions (Addendum)
AS PER OUR DISCUSSION A LETTER WILL BE MAILED TO YOU ABOUT BEING TERMINATED FROM THIS CLINIC .( When discussed being weaned off Xanax gradually , you wanted to find another provider)

## 2018-03-22 ENCOUNTER — Other Ambulatory Visit: Payer: Self-pay | Admitting: Psychiatry

## 2018-04-07 ENCOUNTER — Inpatient Hospital Stay: Payer: Medicare HMO | Attending: Radiation Oncology

## 2018-04-07 DIAGNOSIS — C61 Malignant neoplasm of prostate: Secondary | ICD-10-CM | POA: Insufficient documentation

## 2018-04-07 LAB — PSA: PROSTATIC SPECIFIC ANTIGEN: 0.46 ng/mL (ref 0.00–4.00)

## 2018-04-14 ENCOUNTER — Ambulatory Visit: Payer: PPO | Admitting: Radiation Oncology

## 2018-04-28 ENCOUNTER — Encounter: Payer: Self-pay | Admitting: Radiation Oncology

## 2018-04-28 ENCOUNTER — Other Ambulatory Visit: Payer: Self-pay

## 2018-04-28 ENCOUNTER — Ambulatory Visit
Admission: RE | Admit: 2018-04-28 | Discharge: 2018-04-28 | Disposition: A | Discharge status: Home / Self Care | Payer: Medicare HMO | Source: Ambulatory Visit | Attending: Radiation Oncology | Admitting: Radiation Oncology

## 2018-04-28 DIAGNOSIS — Z923 Personal history of irradiation: Secondary | ICD-10-CM | POA: Insufficient documentation

## 2018-04-28 DIAGNOSIS — C61 Malignant neoplasm of prostate: Secondary | ICD-10-CM | POA: Diagnosis present

## 2018-04-28 DIAGNOSIS — M5136 Other intervertebral disc degeneration, lumbar region: Secondary | ICD-10-CM | POA: Diagnosis not present

## 2018-04-28 NOTE — Progress Notes (Signed)
Radiation Oncology Follow up Note  Name: Christopher Orr   Date:   04/28/2018 MRN:  [id number] DOB: Jul 23, 1946    This 72 y.o. male presents to the clinic today for 2 year follow-up status post I-125 interstitial implant for Gleason 6 adenocarcinoma the prostate.  REFERRING PROVIDER: Jodi Marble, MD  HPI: patient is a 72 year old male now out 2 years having completed I-125 interstitial implant for Gleason 6 adenocarcinoma the prostate presenting the PSA of 3.8. He is seen today in routine follow-up is doing well..he is currently receiving pain management for degenerative disc disease mostly in the lumbar spine. He has a history of lumbar fusion of L4-5. He is more constipated no specific diarrhea. He also states his urinary flow is good without significant urgency or frequency. His most recent PSA is4.6 down from 0.881 year prior  COMPLICATIONS OF TREATMENT: none  FOLLOW UP COMPLIANCE: keeps appointments   PHYSICAL EXAM:  Wt (P) 263 lb 0.1 oz (119.3 kg)   BMI (P) 35.67 kg/m  Well-developed well-nourished patient in NAD. HEENT reveals PERLA, EOMI, discs not visualized.  Oral cavity is clear. No oral mucosal lesions are identified. Neck is clear without evidence of cervical or supraclavicular adenopathy. Lungs are clear to A&P. Cardiac examination is essentially unremarkable with regular rate and rhythm without murmur rub or thrill. Abdomen is benign with no organomegaly or masses noted. Motor sensory and DTR levels are equal and symmetric in the upper and lower extremities. Cranial nerves II through XII are grossly intact. Proprioception is intact. No peripheral adenopathy or edema is identified. No motor or sensory levels are noted. Crude visual fields are within normal range.  RADIOLOGY RESULTS: plain films of the spineas well as MRI of the lumbar spine reviewed  PLAN: present time patient is doing well under good biochemical control of his prostate cancer. I'm please was overall  progress. I've asked to see him back in 1 year for follow-up with a PSA at that time. Patient is to call with any concerns.  I would like to take this opportunity to thank you for allowing me to participate in the care of your patient.Noreene Filbert, MD

## 2018-05-05 ENCOUNTER — Other Ambulatory Visit: Payer: Self-pay | Admitting: Family Medicine

## 2018-05-05 DIAGNOSIS — C61 Malignant neoplasm of prostate: Secondary | ICD-10-CM

## 2018-05-06 ENCOUNTER — Other Ambulatory Visit: Payer: Medicare HMO

## 2018-05-06 DIAGNOSIS — C61 Malignant neoplasm of prostate: Secondary | ICD-10-CM

## 2018-05-07 LAB — PSA: PROSTATE SPECIFIC AG, SERUM: 0.4 ng/mL (ref 0.0–4.0)

## 2018-05-10 ENCOUNTER — Ambulatory Visit: Payer: Medicare HMO | Admitting: Family Medicine

## 2018-05-11 ENCOUNTER — Ambulatory Visit (INDEPENDENT_AMBULATORY_CARE_PROVIDER_SITE_OTHER): Payer: Medicare HMO | Admitting: Urology

## 2018-05-11 ENCOUNTER — Encounter: Payer: Self-pay | Admitting: Urology

## 2018-05-11 VITALS — BP 127/77 | HR 79 | Resp 16 | Ht 72.0 in | Wt 267.3 lb

## 2018-05-11 DIAGNOSIS — C61 Malignant neoplasm of prostate: Secondary | ICD-10-CM | POA: Diagnosis not present

## 2018-05-11 DIAGNOSIS — K4090 Unilateral inguinal hernia, without obstruction or gangrene, not specified as recurrent: Secondary | ICD-10-CM | POA: Diagnosis not present

## 2018-05-11 DIAGNOSIS — N3941 Urge incontinence: Secondary | ICD-10-CM

## 2018-05-11 LAB — BLADDER SCAN AMB NON-IMAGING: SCAN RESULT: 27

## 2018-05-11 NOTE — Progress Notes (Signed)
05/11/2018 4:49 PM   Christopher Orr 1945/08/23 [id number]  Referring provider: Jodi Marble, MD [place], [place] [address]  Chief Complaint  Patient presents with  . Follow-up    HPI: 72 year old male who presents today for routine six-month follow-up.  He is followed for history of prostate cancer, BPH with urinary frequency/urgency and urge incontinence and ADD.  Today especially, he continues to complain of left inner thigh pain as well as left inguinal pain.  He said this complaint on previous occasions.  He had a scrotal ultrasound which was fairly unremarkable.  This exacerbated with activity.  He believes he has a hernia.  He was told he had a hernia on a CT scan that was done at [facility].  He has not been referred to general surgery.  History of prostate cancer He was dx with low risk Gleason 3+3 prostate cancer s/p brachytherapy seed implant 04/22/16 with I-125 seeds.   He was dx after rise in his PSA up to 3.8 on 12/20/15 up from 3.2 on 09/24/15, and 2.2 the previous year s/p prostate biopsy on 01/15/16 demonstrating prostate cancer, T1c, Gleason 3+3 involving 7/12 cores including all cores on left and single core at right apex up to 70% tissue.  TRUS volume 42 cc.  Most recent PSA 0.4 on 05/06/2018.  BPH with LUTS/ urinary frequency He does also have baseline lower urinary tract symptoms including urinary urgency and urge incontinence. He is currently on Mybetriq 50 mg and flomax 0.4 mg.     He reports today that this regimen is working well for him.  When he stops the medication runs out, his symptoms are significantly worse.  He now has very rare episodes of urge incontinence.  He is pleased.  No gross hematuria or dysuria.    ED Baseline severe ED, unable to achieve or maintain an erection.  No discussed today.    PMH: Past Medical History:  Diagnosis Date  . Alcohol abuse   . Anemia   . Bipolar disorder ([place])   . BPH (benign  prostatic hypertrophy) with urinary obstruction   . CAD (coronary artery disease)    a. s/p stent to LAD in 2006 at [facility], EF 60% at that time.  . Cancer [facility])    prostate  . CHF (congestive heart failure) ([place])   . Chronic pancreatitis ([place])   . COPD (chronic obstructive pulmonary disease) ([place])   . Depression   . Diverticulosis   . Drug use   . ED (erectile dysfunction)   . GERD (gastroesophageal reflux disease)   . Hepatitis C   . History of lumbar fusion   . Hyperlipidemia   . Hypertension   . Mitral regurgitation   . PTSD (post-traumatic stress disorder)   . Urge incontinence     Surgical History: Past Surgical History:  Procedure Laterality Date  . ESOPHAGOGASTRODUODENOSCOPY N/A 12/22/2014   Procedure: ESOPHAGOGASTRODUODENOSCOPY (EGD);  Surgeon: [name], MD;  Location: [facility] ENDOSCOPY;  Service: Endoscopy;  Laterality: N/A;  . LUMBAR FUSION    . RADIOACTIVE SEED IMPLANT N/A 04/22/2016   Procedure: RADIOACTIVE SEED IMPLANT/BRACHYTHERAPY IMPLANT;  Surgeon: Hollice Espy, MD;  Location: [facility] ORS;  Service: Urology;  Laterality: N/A;  . TONSILLECTOMY      Home Medications:  Allergies as of 05/11/2018   No Known Allergies     Medication List        Accurate as of 05/11/18  4:49 PM. Always use your most recent med list.  albuterol (2.5 MG/3ML) 0.083% nebulizer solution Commonly known as:  PROVENTIL USE TWICE A DAY PER NEBULIZER   ALPRAZolam 1 MG tablet Commonly known as:  XANAX Take 1.5 tablets (1.5 mg total) by mouth at bedtime.   ARIPiprazole 10 MG tablet Commonly known as:  ABILIFY Take 0.5 tablets (5 mg total) by mouth daily.   aspirin 81 MG tablet Take 81 mg by mouth daily. Reported on 01/15/2016   busPIRone 10 MG tablet Commonly known as:  BUSPAR Take 1 tablet (10 mg total) by mouth 2 (two) times daily.   celecoxib 400 MG capsule Commonly known as:  CELEBREX Take 400 mg by mouth daily after breakfast.   clopidogrel 75 MG  tablet Commonly known as:  PLAVIX Take 75 mg by mouth daily. Reported on 01/15/2016   cyclobenzaprine 5 MG tablet Commonly known as:  FLEXERIL Take 1 tablet (5 mg total) by mouth 3 (three) times daily as needed for muscle spasms.   docusate sodium 100 MG capsule Commonly known as:  COLACE Take 1 capsule (100 mg total) by mouth 2 (two) times daily.   FLUoxetine HCl 60 MG Tabs Take 1 tablet by mouth daily.   folic acid 1 MG tablet Commonly known as:  FOLVITE Take 1 mg by mouth daily.   ibuprofen 600 MG tablet Commonly known as:  ADVIL,MOTRIN   metoprolol succinate 25 MG 24 hr tablet Commonly known as:  TOPROL-XL   mirabegron ER 50 MG Tb24 tablet Commonly known as:  MYRBETRIQ Take 1 tablet (50 mg total) by mouth daily.   multivitamin with minerals tablet Take 1 tablet by mouth daily.   pantoprazole 40 MG tablet Commonly known as:  PROTONIX Take 40 mg by mouth daily. Reported on 09/14/2015   rosuvastatin 10 MG tablet Commonly known as:  CRESTOR Take 10 mg by mouth daily.   SPIRIVA RESPIMAT 2.5 MCG/ACT Aers Generic drug:  Tiotropium Bromide Monohydrate   tamsulosin 0.4 MG Caps capsule Commonly known as:  FLOMAX Take 1 capsule (0.4 mg total) by mouth daily after supper.   traZODone 150 MG tablet Commonly known as:  DESYREL Take 1 tablet (150 mg total) by mouth at bedtime.       Allergies: No Known Allergies  Family History: Family History  Problem Relation Age of Onset  . Heart disease Father        Unclear details. Father passed in late 54's 2/2 cancer.  . Colon cancer Father   . Alcohol abuse Sister   . Drug abuse Sister   . Anxiety disorder Sister   . Depression Sister   . Kidney disease Neg Hx   . Prostate cancer Neg Hx     Social History:  reports that he quit smoking about 5 years ago. His smoking use included cigarettes. He started smoking about 55 years ago. He smoked 1.50 packs per day. He has quit using smokeless tobacco. He reports that he  drinks about 6.0 - 14.0 standard drinks of alcohol per week. He reports that he has current or past drug history. Drug: Marijuana.  ROS: UROLOGY Frequent Urination?: No Hard to postpone urination?: Yes Burning/pain with urination?: No Get up at night to urinate?: Yes Leakage of urine?: Yes Urine stream starts and stops?: No Trouble starting stream?: No Do you have to strain to urinate?: No Blood in urine?: No Urinary tract infection?: No Sexually transmitted disease?: No Injury to kidneys or bladder?: No Painful intercourse?: No Weak stream?: No Erection problems?: Yes Penile pain?: No  Gastrointestinal  Nausea?: No Vomiting?: No Indigestion/heartburn?: Yes Diarrhea?: No Constipation?: No  Constitutional Fever: No Night sweats?: No Weight loss?: No Fatigue?: Yes  Skin Skin rash/lesions?: No Itching?: No  Eyes Blurred vision?: No Double vision?: No  Ears/Nose/Throat Sore throat?: No Sinus problems?: Yes  Hematologic/Lymphatic Swollen glands?: No Easy bruising?: Yes  Cardiovascular Leg swelling?: No Chest pain?: No  Respiratory Cough?: Yes Shortness of breath?: Yes  Endocrine Excessive thirst?: No  Musculoskeletal Back pain?: Yes Joint pain?: No  Neurological Headaches?: Yes Dizziness?: No  Psychologic Depression?: Yes Anxiety?: Yes   Physical Exam: BP 127/77   Pulse 79   Resp 16   Ht 6' (1.829 m)   Wt 267 lb 4.8 oz (121.2 kg)   BMI 36.25 kg/m   Constitutional:  Alert and oriented, No acute distress. HEENT: [place] AT, moist mucus membranes.  Trachea midline, no masses. Cardiovascular: No clubbing, cyanosis, or edema. Respiratory: Normal respiratory effort, no increased work of breathing. GI: Abdomen is soft, nontender, nondistended, no abdominal masses, umbilical hernia, reducible appreciated.  He also has some bulging in his left lower quadrant and a focal area of tenderness which does bulge with Valsalva concerning for left inguinal  hernia. GU: Bilateral descended testicles.  Normal phallus. Skin: No rashes, bruises or suspicious lesions. Neurologic: Grossly intact, no focal deficits, moving all 4 extremities. Psychiatric: Normal mood and affect.  Laboratory Data: Lab Results  Component Value Date   WBC 7.8 04/08/2016   HGB 13.4 04/08/2016   HCT 40.3 04/08/2016   MCV 98.8 04/08/2016   PLT 260 04/08/2016    Lab Results  Component Value Date   CREATININE 1.10 11/08/2015    Urinalysis NA  Pertinent Imaging: NA- advised patient to bring OSH disk for me to review  Assessment & Plan:    1. Prostate cancer ([place]) PSA low, stable Continue to follow every 6 months We will arrange for six-month labs without visit, see again in 1 year with PSA - Bladder Scan (Post Void Residual) in office - PSA; Future  2. Left inguinal hernia Symptomatic left inguinal hernia appreciated on exam today Referral to general surgery - Ambulatory referral to General Surgery  3. Urge incontinence Well-controlled on Myrbetriq and Flomax Continue these medications    Return in about 1 year (around 05/12/2019) for 6 months PSA lab only, 12 month MD PSA/ IPSS/ PVR.  Hollice Espy, MD  [facility] [address] [place],  [address] [phone]

## 2018-05-12 ENCOUNTER — Encounter: Payer: Self-pay | Admitting: *Deleted

## 2018-05-12 ENCOUNTER — Telehealth: Payer: Self-pay | Admitting: Urology

## 2018-05-12 NOTE — Telephone Encounter (Signed)
Please let the patient know that this was a CT of his chest and the pictures only extended down to the level of the kidneys, not into the pelvis.  No hernia was visualized but the CT scan did not go low enough.  Hollice Espy, MD

## 2018-05-12 NOTE — Telephone Encounter (Signed)
Pt brought in [place] and I put on your desk.  Just F.Y.I.

## 2018-05-13 NOTE — Telephone Encounter (Signed)
Patient notified. Patient states He will talk to his PCP at his next appointment.

## 2018-05-17 ENCOUNTER — Other Ambulatory Visit: Payer: Self-pay

## 2018-05-17 ENCOUNTER — Emergency Department
Admission: EM | Admit: 2018-05-17 | Discharge: 2018-05-17 | Disposition: A | Discharge status: Home / Self Care | Payer: Medicare HMO | Attending: Emergency Medicine | Admitting: Emergency Medicine

## 2018-05-17 ENCOUNTER — Ambulatory Visit (INDEPENDENT_AMBULATORY_CARE_PROVIDER_SITE_OTHER): Payer: Medicare HMO | Admitting: Surgery

## 2018-05-17 ENCOUNTER — Emergency Department: Payer: Medicare HMO

## 2018-05-17 ENCOUNTER — Encounter: Payer: Self-pay | Admitting: Emergency Medicine

## 2018-05-17 ENCOUNTER — Encounter: Payer: Self-pay | Admitting: Surgery

## 2018-05-17 VITALS — BP 139/93 | HR 71 | Temp 97.5°F | Resp 18 | Ht 72.0 in | Wt 262.0 lb

## 2018-05-17 DIAGNOSIS — Z7982 Long term (current) use of aspirin: Secondary | ICD-10-CM | POA: Diagnosis not present

## 2018-05-17 DIAGNOSIS — J449 Chronic obstructive pulmonary disease, unspecified: Secondary | ICD-10-CM | POA: Diagnosis not present

## 2018-05-17 DIAGNOSIS — I110 Hypertensive heart disease with heart failure: Secondary | ICD-10-CM | POA: Diagnosis not present

## 2018-05-17 DIAGNOSIS — Z87891 Personal history of nicotine dependence: Secondary | ICD-10-CM | POA: Insufficient documentation

## 2018-05-17 DIAGNOSIS — K4090 Unilateral inguinal hernia, without obstruction or gangrene, not specified as recurrent: Secondary | ICD-10-CM

## 2018-05-17 DIAGNOSIS — R079 Chest pain, unspecified: Secondary | ICD-10-CM | POA: Diagnosis present

## 2018-05-17 DIAGNOSIS — I2510 Atherosclerotic heart disease of native coronary artery without angina pectoris: Secondary | ICD-10-CM | POA: Insufficient documentation

## 2018-05-17 DIAGNOSIS — R911 Solitary pulmonary nodule: Secondary | ICD-10-CM | POA: Insufficient documentation

## 2018-05-17 DIAGNOSIS — Z79899 Other long term (current) drug therapy: Secondary | ICD-10-CM | POA: Diagnosis not present

## 2018-05-17 DIAGNOSIS — Z8546 Personal history of malignant neoplasm of prostate: Secondary | ICD-10-CM | POA: Insufficient documentation

## 2018-05-17 DIAGNOSIS — Z7902 Long term (current) use of antithrombotics/antiplatelets: Secondary | ICD-10-CM | POA: Diagnosis not present

## 2018-05-17 DIAGNOSIS — I509 Heart failure, unspecified: Secondary | ICD-10-CM | POA: Diagnosis not present

## 2018-05-17 LAB — CBC
HEMATOCRIT: 35.8 % — AB (ref 39.0–52.0)
HEMOGLOBIN: 12.1 g/dL — AB (ref 13.0–17.0)
MCH: 32.7 pg (ref 26.0–34.0)
MCHC: 33.8 g/dL (ref 30.0–36.0)
MCV: 96.8 fL (ref 80.0–100.0)
Platelets: 260 10*3/uL (ref 150–400)
RBC: 3.7 MIL/uL — AB (ref 4.22–5.81)
RDW: 13.7 % (ref 11.5–15.5)
WBC: 7.1 10*3/uL (ref 4.0–10.5)
nRBC: 0 % (ref 0.0–0.2)

## 2018-05-17 LAB — BASIC METABOLIC PANEL
Anion gap: 6 (ref 5–15)
BUN: 19 mg/dL (ref 8–23)
CO2: 23 mmol/L (ref 22–32)
Calcium: 8.9 mg/dL (ref 8.9–10.3)
Chloride: 107 mmol/L (ref 98–111)
Creatinine, Ser: 1.18 mg/dL (ref 0.61–1.24)
GFR calc Af Amer: >60 mL/min (ref >60)
GFR calc non Af Amer: >60 mL/min (ref >60)
Glucose, Bld: 96 mg/dL (ref 70–99)
Potassium: 4.4 mmol/L (ref 3.5–5.1)
Sodium: 136 mmol/L (ref 135–145)

## 2018-05-17 LAB — TROPONIN I: Troponin I: <0.03 ng/mL (ref <0.03)

## 2018-05-17 NOTE — Patient Instructions (Addendum)
You have chose to have your hernia repaired. This will be done by Dr.Pabon at [facility].  You will need to arrange to be out of work for 2 weeks and then return with a lifting restrictions for 4 more weeks. Please send any FMLA paperwork prior to surgery and we will fill this out and fax it back to your employer within 3 business days.  You may have a bruise in your groin and also swelling and brusing in your testicle area. You may use ice 4-5 times daily for 15-20 minutes each time. Make sure that you place a barrier between you and the ice pack. To decrease the swelling, you may roll up a bath towel and place it vertically in between your thighs with your testicles resting on the towel. You will want to keep this area elevated as much as possible for several days following surgery.    Inguinal Hernia, Adult Muscles help keep everything in the body in its proper place. But if a weak spot in the muscles develops, something can poke through. That is called a hernia. When this happens in the lower part of the belly (abdomen), it is called an inguinal hernia. (It takes its name from a part of the body in this region called the inguinal canal.) A weak spot in the wall of muscles lets some fat or part of the small intestine bulge through. An inguinal hernia can develop at any age. Men get them more often than women. CAUSES  In adults, an inguinal hernia develops over time.  It can be triggered by:  Suddenly straining the muscles of the lower abdomen.  Lifting heavy objects.  Straining to have a bowel movement. Difficult bowel movements (constipation) can lead to this.  Constant coughing. This may be caused by smoking or lung disease.  Being overweight.  Being pregnant.  Working at a job that requires long periods of standing or heavy lifting.  Having had an inguinal hernia before. One type can be an emergency situation. It is called a strangulated inguinal hernia. It develops if part of the small  intestine slips through the weak spot and cannot get back into the abdomen. The blood supply can be cut off. If that happens, part of the intestine may die. This situation requires emergency surgery. SYMPTOMS  Often, a small inguinal hernia has no symptoms. It is found when a healthcare provider does a physical exam. Larger hernias usually have symptoms.   In adults, symptoms may include:  A lump in the groin. This is easier to see when the person is standing. It might disappear when lying down.  In men, a lump in the scrotum.  Pain or burning in the groin. This occurs especially when lifting, straining or coughing.  A dull ache or feeling of pressure in the groin.  Signs of a strangulated hernia can include:  A bulge in the groin that becomes very painful and tender to the touch.  A bulge that turns red or purple.  Fever, nausea and vomiting.  Inability to have a bowel movement or to pass gas. DIAGNOSIS  To decide if you have an inguinal hernia, a healthcare provider will probably do a physical examination.  This will include asking questions about any symptoms you have noticed.  The healthcare provider might feel the groin area and ask you to cough. If an inguinal hernia is felt, the healthcare provider may try to slide it back into the abdomen.  Usually no other tests are needed.  TREATMENT  Treatments can vary. The size of the hernia makes a difference. Options include:  Watchful waiting. This is often suggested if the hernia is small and you have had no symptoms.  No medical procedure will be done unless symptoms develop.  You will need to watch closely for symptoms. If any occur, contact your healthcare provider right away.  Surgery. This is used if the hernia is larger or you have symptoms.  Open surgery. This is usually an outpatient procedure (you will not stay overnight in a hospital). An cut (incision) is made through the skin in the groin. The hernia is put back  inside the abdomen. The weak area in the muscles is then repaired by herniorrhaphy or hernioplasty. Herniorrhaphy: in this type of surgery, the weak muscles are sewn back together. Hernioplasty: a patch or mesh is used to close the weak area in the abdominal wall.  Laparoscopy. In this procedure, a surgeon makes small incisions. A thin tube with a tiny video camera (called a laparoscope) is put into the abdomen. The surgeon repairs the hernia with mesh by looking with the video camera and using two long instruments. HOME CARE INSTRUCTIONS   After surgery to repair an inguinal hernia:  You will need to take pain medicine prescribed by your healthcare provider. Follow all directions carefully.  You will need to take care of the wound from the incision.  Your activity will be restricted for awhile. This will probably include no heavy lifting for several weeks. You also should not do anything too active for a few weeks. When you can return to work will depend on the type of job that you have.  During "watchful waiting" periods, you should:  Maintain a healthy weight.  Eat a diet high in fiber (fruits, vegetables and whole grains).  Drink plenty of fluids to avoid constipation. This means drinking enough water and other liquids to keep your urine clear or pale yellow.  Do not lift heavy objects.  Do not stand for long periods of time.  Quit smoking. This should keep you from developing a frequent cough. SEEK MEDICAL CARE IF:   A bulge develops in your groin area.  You feel pain, a burning sensation or pressure in the groin. This might be worse if you are lifting or straining.  You develop a fever of more than 100.5 F (38.1 C). SEEK IMMEDIATE MEDICAL CARE IF:   Pain in the groin increases suddenly.  A bulge in the groin gets bigger suddenly and does not go down.  For men, there is sudden pain in the scrotum. Or, the size of the scrotum increases.  A bulge in the groin area  becomes red or purple and is painful to touch.  You have nausea or vomiting that does not go away.  You feel your heart beating much faster than normal.  You cannot have a bowel movement or pass gas.  You develop a fever of more than 102.0 F (38.9 C).   This information is not intended to replace advice given to you by your health care provider. Make sure you discuss any questions you have with your health care provider.   Document Released: 11/30/2008 Document Revised: 10/06/2011 Document Reviewed: 01/15/2015 Elsevier Interactive Patient Education 2016 [place].  Please see your CT scan appointment listed below. Please go across the street to pick up your prep kit and instructions for the CT scan.   [facility]  [place]  Please do  not eat or drink 4 hours prior to having the scan.   We need for you to go to the Lab today for blood work.   Please see your follow up appointment listed below.

## 2018-05-17 NOTE — ED Notes (Signed)
AAOx3.  Skin warm and dry. NAD.  No SOB/ DOE.

## 2018-05-17 NOTE — ED Provider Notes (Signed)
[facility] Emergency Department Provider Note ____________________________________________   First MD Initiated Contact with Patient 05/17/18 1411     (approximate)  I have reviewed the triage vital signs and the nursing notes.   HISTORY  Chief Complaint Chest Pain   HPI Christopher Orr is a 72 y.o. male with a history of alcohol abuse as well as coronary artery disease and bipolar disorder who was presented to the emergency department today complaining of several weeks to months of left-sided chest pain which is nonradiating.  He says the pain is a 10 out of 10 when it is present and worse with laying back.  Patient denies the chest pain worsening with exertion.  He says that it usually sitting up or coughing clears the pain.  Patient says that the pain is a cramping and not associate with shortness of breath.  Denies any nausea or vomiting.  Says that he was sent here by his urologist for further evaluation of the pain as he is being preop had for possible hernia repair.  Patient says that this is a left groin hernia that hurts him when he is ambulatory.  However, he says that his urologist, Dr. Erlene Quan says that she did not see a hernia on ultrasound or CAT scan.   Past Medical History:  Diagnosis Date  . Alcohol abuse   . Anemia   . Bipolar disorder ([place])   . BPH (benign prostatic hypertrophy) with urinary obstruction   . CAD (coronary artery disease)    a. s/p stent to LAD in 2006 at [facility], EF 60% at that time.  . Cancer [facility])    prostate  . CHF (congestive heart failure) ([place])   . Chronic pancreatitis ([place])   . COPD (chronic obstructive pulmonary disease) ([place])   . Depression   . Diverticulosis   . Drug use   . ED (erectile dysfunction)   . GERD (gastroesophageal reflux disease)   . Hepatitis C   . History of lumbar fusion   . Hyperlipidemia   . Hypertension   . Mitral regurgitation   . PTSD (post-traumatic stress disorder)   . Urge  incontinence     Patient Active Problem List   Diagnosis Date Noted  . Chronic viral hepatitis C ([place]) 09/24/2014  . Chest pain 07/28/2014  . COPD (chronic obstructive pulmonary disease) ([place]) 07/28/2014  . Anxiety 07/28/2014  . Lactic acidosis 07/28/2014  . Aspiration pneumonia ([place]) 07/28/2014  . Elevated troponin I level 07/28/2014  . Acute kidney injury ([place]) 07/28/2014  . GERD (gastroesophageal reflux disease) 07/28/2014  . Heroin overdose ([place]) 07/27/2014  . Unresponsive     Past Surgical History:  Procedure Laterality Date  . ESOPHAGOGASTRODUODENOSCOPY N/A 12/22/2014   Procedure: ESOPHAGOGASTRODUODENOSCOPY (EGD);  Surgeon: [name], MD;  Location: [facility] ENDOSCOPY;  Service: Endoscopy;  Laterality: N/A;  . LUMBAR FUSION    . RADIOACTIVE SEED IMPLANT N/A 04/22/2016   Procedure: RADIOACTIVE SEED IMPLANT/BRACHYTHERAPY IMPLANT;  Surgeon: Hollice Espy, MD;  Location: [facility] ORS;  Service: Urology;  Laterality: N/A;  . TONSILLECTOMY      Prior to Admission medications   Medication Sig Start Date End Date Taking? Authorizing Provider  albuterol (PROVENTIL) (2.5 MG/3ML) 0.083% nebulizer solution USE TWICE A DAY PER NEBULIZER 03/06/15   [provider]  ALPRAZolam (XANAX) 1 MG tablet Take 1.5 tablets (1.5 mg total) by mouth at bedtime. 01/25/18   Ursula Alert, MD  ARIPiprazole (ABILIFY) 10 MG tablet Take 0.5 tablets (5 mg total)  by mouth daily. 12/25/17   Ursula Alert, MD  aspirin 81 MG tablet Take 81 mg by mouth daily. Reported on 01/15/2016    [provider]  busPIRone (BUSPAR) 10 MG tablet Take 1 tablet (10 mg total) by mouth 2 (two) times daily. 12/25/17   Ursula Alert, MD  celecoxib (CELEBREX) 400 MG capsule Take 400 mg by mouth daily after breakfast.    [provider]  clopidogrel (PLAVIX) 75 MG tablet Take 75 mg by mouth daily. Reported on 01/15/2016 10/31/14   [provider]  cyclobenzaprine (FLEXERIL) 5 MG tablet Take 1 tablet  (5 mg total) by mouth 3 (three) times daily as needed for muscle spasms. 04/30/17   Cuthriell, Charline Bills, PA-C  docusate sodium (COLACE) 100 MG capsule Take 1 capsule (100 mg total) by mouth 2 (two) times daily. 04/22/16   Hollice Espy, MD  FLUoxetine HCl 60 MG TABS Take 1 tablet by mouth daily. 12/25/17   Ursula Alert, MD  folic acid (FOLVITE) 1 MG tablet Take 1 mg by mouth daily.    [provider]  ibuprofen (ADVIL,MOTRIN) 600 MG tablet  10/07/17   [provider]  metoprolol succinate (TOPROL-XL) 25 MG 24 hr tablet  04/02/17   [provider]  mirabegron ER (MYRBETRIQ) 50 MG TB24 tablet Take 1 tablet (50 mg total) by mouth daily. 11/10/17   Hollice Espy, MD  Multiple Vitamins-Minerals (MULTIVITAMIN WITH MINERALS) tablet Take 1 tablet by mouth daily.    [provider]  pantoprazole (PROTONIX) 40 MG tablet Take 40 mg by mouth daily. Reported on 09/14/2015 09/03/15   [provider]  rosuvastatin (CRESTOR) 10 MG tablet Take 10 mg by mouth daily. 08/01/15   [provider]  SPIRIVA RESPIMAT 2.5 MCG/ACT AERS  02/13/17   [provider]  tamsulosin (FLOMAX) 0.4 MG CAPS capsule Take 1 capsule (0.4 mg total) by mouth daily after supper. 10/09/16   Noreene Filbert, MD  traZODone (DESYREL) 150 MG tablet Take 1 tablet (150 mg total) by mouth at bedtime. 12/25/17   Ursula Alert, MD    Allergies Patient has no known allergies.  Family History  Problem Relation Age of Onset  . Heart disease Father        Unclear details. Father passed in late 3's 2/2 cancer.  . Colon cancer Father   . Alcohol abuse Sister   . Drug abuse Sister   . Anxiety disorder Sister   . Depression Sister   . Kidney disease Neg Hx   . Prostate cancer Neg Hx     Social History Social History   Tobacco Use  . Smoking status: Former Smoker    Packs/day: 1.50    Types: Cigarettes    Start date: 07/28/1962    Last attempt to quit: 07/28/2012    Years since  quitting: 5.8  . Smokeless tobacco: Former Network engineer Use Topics  . Alcohol use: Yes    Alcohol/week: 6.0 - 14.0 standard drinks    Types: 6 - 14 Cans of beer per week    Comment: 6pack and fifth of liquor each day  . Drug use: Yes    Types: Marijuana    Comment: Endorsed heroin 06/2014, UDS also + benzos, opiates, THC, neg for cocaine at that time.    Review of Systems  Constitutional: No fever/chills Eyes: No visual changes. ENT: No sore throat. Cardiovascular: As above Respiratory: Denies shortness of breath. Gastrointestinal: No abdominal pain.  No nausea, no vomiting.  No  diarrhea.  No constipation. Genitourinary: As above Musculoskeletal: Negative for back pain. Skin: Negative for rash. Neurological: Negative for headaches, focal weakness or numbness.   ____________________________________________   PHYSICAL EXAM:  VITAL SIGNS: ED Triage Vitals [05/17/18 1207]  Enc Vitals Group     BP 110/76     Pulse Rate 76     Resp 16     Temp 97.7 F (36.5 C)     Temp Source Oral     SpO2 94 %     Weight 267 lb 4.8 oz (121.2 kg)     Height 6' (1.829 m)     Head Circumference      Peak Flow      Pain Score 6     Pain Loc      Pain Edu?      Excl. in [place]?     Constitutional: Alert and oriented. Well appearing and in no acute distress. Eyes: Conjunctivae are normal.  Head: Atraumatic. Nose: No congestion/rhinnorhea. Mouth/Throat: Mucous membranes are moist.  Neck: No stridor.   Cardiovascular: Normal rate, regular rhythm. Grossly normal heart sounds.  Chest pain not reproducible to palpation. Respiratory: Normal respiratory effort.  No retractions. Lungs CTAB. Gastrointestinal: Soft and nontender. No distention. No CVA tenderness. Genitourinary: No inguinal masses nor there are any testicular masses visualized grossly or on palpation.  No testicular nor inguinal tenderness to palpation.  No penile nor testicular swelling. Musculoskeletal: No lower extremity  tenderness nor edema.  No joint effusions. Neurologic:  Normal speech and language. No gross focal neurologic deficits are appreciated. Skin:  Skin is warm, dry and intact. No rash noted. Psychiatric: Mood and affect are normal. Speech and behavior are normal.  ____________________________________________   LABS (all labs ordered are listed, but only abnormal results are displayed)  Labs Reviewed  CBC - Abnormal; Notable for the following components:      Result Value   RBC 3.70 (*)    Hemoglobin 12.1 (*)    HCT 35.8 (*)    All other components within normal limits  BASIC METABOLIC PANEL  TROPONIN I   ____________________________________________  EKG  ED ECG REPORT I, Doran Stabler, the attending physician, personally viewed and interpreted this ECG.   Date: 05/17/2018  EKG Time: 1201  Rate: 79  Rhythm: normal sinus rhythm  Axis: Normal  Intervals:left anterior fascicular block  ST&T Change: No ST segment elevation or depression.  No abnormal T wave inversion.  ____________________________________________  RADIOLOGY  Evidence of emphysema as well as scarring oral atelectasis on the right. ____________________________________________   PROCEDURES  Procedure(s) performed:   Procedures  Critical Care performed:   ____________________________________________   INITIAL IMPRESSION / ASSESSMENT AND PLAN / ED COURSE  Pertinent labs & imaging results that were available during my care of the patient were reviewed by me and considered in my medical decision making (see chart for details).  Differential diagnosis includes, but is not limited to, ACS, aortic dissection, pulmonary embolism, cardiac tamponade, pneumothorax, pneumonia, pericarditis, myocarditis, GI-related causes including esophagitis/gastritis, and musculoskeletal chest wall pain.   As part of my medical decision making, I reviewed the following data within the electronic MEDICAL RECORD NUMBER Notes from  prior ED visits and recent urologic visit  Reviewed patient's recent urologic visit from 15 October which revealed a left-sided inguinal hernia.  Patient referred to general surgery for this.  Patient at this time with weeks to months of chest pain which is not exertional and improves with exertion and worsens  with laying flat.  Reassuring work-up without any significant EKG changes.  Reassuring lab work.  Patient counseled about scarring versus atelectasis and needing to follow-up with his primary care doctor.  Also refer to cardiology.  Symptoms are nonexertional.  Work-up reassuring.  Patient will be discharged so he can go to his surgery appointment today.  Patient understanding the diagnosis as well as treatment plan willing to comply. ____________________________________________   FINAL CLINICAL IMPRESSION(S) / ED DIAGNOSES  Chest pain.  NEW MEDICATIONS STARTED DURING THIS VISIT:  New Prescriptions   No medications on file     Note:  This document was prepared using Dragon voice recognition software and may include unintentional dictation errors.     Orbie Pyo, MD 05/17/18 [id number]

## 2018-05-17 NOTE — ED Triage Notes (Signed)
Pt arrived via POV with reports of chest pain that has been ongoing for several weeks, pt states he was sent over here by surgeon who is evaluating pt for hernia surgery.  Pt states the pain is in the left chest that radiates to the left side of his back. Pain is worse with laying down and when coughing.  Pt reports productive cough at times.

## 2018-05-18 ENCOUNTER — Other Ambulatory Visit
Admission: RE | Admit: 2018-05-18 | Discharge: 2018-05-18 | Disposition: A | Discharge status: Home / Self Care | Payer: Medicare HMO | Source: Ambulatory Visit | Attending: Surgery | Admitting: Surgery

## 2018-05-18 ENCOUNTER — Encounter: Payer: Self-pay | Admitting: Surgery

## 2018-05-18 ENCOUNTER — Other Ambulatory Visit: Payer: Self-pay

## 2018-05-18 DIAGNOSIS — C61 Malignant neoplasm of prostate: Secondary | ICD-10-CM | POA: Diagnosis present

## 2018-05-18 DIAGNOSIS — K4090 Unilateral inguinal hernia, without obstruction or gangrene, not specified as recurrent: Secondary | ICD-10-CM

## 2018-05-18 LAB — COMPREHENSIVE METABOLIC PANEL
ALK PHOS: 83 U/L (ref 38–126)
ALT: 22 U/L (ref 0–44)
ANION GAP: 10 (ref 5–15)
AST: 28 U/L (ref 15–41)
Albumin: 4.3 g/dL (ref 3.5–5.0)
BILIRUBIN TOTAL: 0.5 mg/dL (ref 0.3–1.2)
BUN: 16 mg/dL (ref 8–23)
CALCIUM: 9.3 mg/dL (ref 8.9–10.3)
CO2: 23 mmol/L (ref 22–32)
CREATININE: 1.21 mg/dL (ref 0.61–1.24)
Chloride: 106 mmol/L (ref 98–111)
GFR, EST NON AFRICAN AMERICAN: 58 mL/min — AB (ref >60)
Glucose, Bld: 108 mg/dL — ABNORMAL HIGH (ref 70–99)
Potassium: 4.4 mmol/L (ref 3.5–5.1)
Sodium: 139 mmol/L (ref 135–145)
TOTAL PROTEIN: 7.6 g/dL (ref 6.5–8.1)

## 2018-05-18 LAB — PROTIME-INR
INR: 0.92
Prothrombin Time: 12.3 seconds (ref 11.4–15.2)

## 2018-05-18 NOTE — Progress Notes (Signed)
Patient ID: Christopher Orr, male   DOB: 06-20-1946, 72 y.o.   MRN: [id number]  HPI Christopher Orr is a 72 y.o. male Seen in consultation at the reques of Dr. Erlene Quan for possible LEft recurrent IH. HE had an open repair with mesh several years ago. He experiences some intermittent pain, sharp, moderate in nature. Radiates down his medial thigh. No specific aggravating factors. HE does have a hx cirrhosis and was treated for Hep C. Hx Etoh abuse , CAD, COPD  and bipolar disorder. U/S scrotum personally reviewed did not show anything acute. No hematuria, no weight loss, fevers or chills. HE is on ASA and  Plavix daily. HE does have some dyspnea on exertion. CXR personally reviewed c/w COPD HPI  Past Medical History:  Diagnosis Date  . Alcohol abuse   . Anemia   . Bipolar disorder ([place])   . BPH (benign prostatic hypertrophy) with urinary obstruction   . CAD (coronary artery disease)    a. s/p stent to LAD in 2006 at [facility] EF 60% at that time.  . Cancer [facility])    prostate  . CHF (congestive heart failure) ([place])   . Chronic pancreatitis ([place])   . COPD (chronic obstructive pulmonary disease) ([place])   . Depression   . Diverticulosis   . Drug use   . ED (erectile dysfunction)   . GERD (gastroesophageal reflux disease)   . Hepatitis C   . History of lumbar fusion   . Hyperlipidemia   . Hypertension   . Mitral regurgitation   . PTSD (post-traumatic stress disorder)   . Urge incontinence     Past Surgical History:  Procedure Laterality Date  . ESOPHAGOGASTRODUODENOSCOPY N/A 12/22/2014   Procedure: ESOPHAGOGASTRODUODENOSCOPY (EGD);  Surgeon: [name], MD;  Location: [facility] ENDOSCOPY;  Service: Endoscopy;  Laterality: N/A;  . LUMBAR FUSION    . RADIOACTIVE SEED IMPLANT N/A 04/22/2016   Procedure: RADIOACTIVE SEED IMPLANT/BRACHYTHERAPY IMPLANT;  Surgeon: Hollice Espy, MD;  Location: [facility] ORS;  Service: Urology;  Laterality: N/A;  . TONSILLECTOMY      Family History  Problem  Relation Age of Onset  . Heart disease Father        Unclear details. Father passed in late 35's 2/2 cancer.  . Colon cancer Father   . Alcohol abuse Sister   . Drug abuse Sister   . Anxiety disorder Sister   . Depression Sister   . Kidney disease Neg Hx   . Prostate cancer Neg Hx     Social History Social History   Tobacco Use  . Smoking status: Former Smoker    Packs/day: 1.50    Types: Cigarettes    Start date: 07/28/1962    Last attempt to quit: 07/28/2012    Years since quitting: 5.8  . Smokeless tobacco: Former Network engineer Use Topics  . Alcohol use: Yes    Alcohol/week: 6.0 - 14.0 standard drinks    Types: 6 - 14 Cans of beer per week    Comment: 6pack and fifth of liquor each day  . Drug use: Yes    Types: Marijuana    Comment: Endorsed heroin 06/2014, UDS also + benzos, opiates, THC, neg for cocaine at that time.    No Known Allergies  Current Outpatient Medications  Medication Sig Dispense Refill  . albuterol (PROVENTIL) (2.5 MG/3ML) 0.083% nebulizer solution USE TWICE A DAY PER NEBULIZER  0  . ALPRAZolam (XANAX) 1 MG tablet Take 1.5 tablets (1.5 mg total) by mouth  at bedtime. 45 tablet 1  . alprazolam (XANAX) 2 MG tablet     . ARIPiprazole (ABILIFY) 10 MG tablet Take 0.5 tablets (5 mg total) by mouth daily. 45 tablet 0  . aspirin 81 MG tablet Take 81 mg by mouth daily. Reported on 01/15/2016    . celecoxib (CELEBREX) 400 MG capsule Take 400 mg by mouth daily after breakfast.    . clopidogrel (PLAVIX) 75 MG tablet Take 75 mg by mouth daily. Reported on 01/15/2016  5  . FLUoxetine (PROZAC) 20 MG capsule     . FLUoxetine HCl 60 MG TABS Take 1 tablet by mouth daily. 90 tablet 0  . folic acid (FOLVITE) 1 MG tablet Take 1 mg by mouth daily.    . metoprolol succinate (TOPROL-XL) 25 MG 24 hr tablet     . mirabegron ER (MYRBETRIQ) 50 MG TB24 tablet Take 1 tablet (50 mg total) by mouth daily. 90 tablet 3  . Multiple Vitamins-Minerals (MULTIVITAMIN WITH MINERALS) tablet  Take 1 tablet by mouth daily.    [place] Kitchen omega-3 fish oil (MAXEPA) 1000 MG CAPS capsule     . pantoprazole (PROTONIX) 40 MG tablet Take 40 mg by mouth daily. Reported on 09/14/2015  0  . rosuvastatin (CRESTOR) 10 MG tablet Take 10 mg by mouth daily.  1  . SPIRIVA RESPIMAT 2.5 MCG/ACT AERS     . tamsulosin (FLOMAX) 0.4 MG CAPS capsule Take 1 capsule (0.4 mg total) by mouth daily after supper. 30 capsule 12  . traZODone (DESYREL) 150 MG tablet Take 1 tablet (150 mg total) by mouth at bedtime. 90 tablet 0   No current facility-administered medications for this visit.      Review of Systems Full ROS  was asked and was negative except for the information on the HPI  Physical Exam Blood pressure (!) 139/93, pulse 71, temperature (!) 97.5 F (36.4 C), temperature source Temporal, resp. rate 18, height 6' (1.829 m), weight 262 lb (118.8 kg), SpO2 97 %. CONSTITUTIONAL: Obese in NAD. EYES: Pupils are equal, round, and reactive to light, Sclera are non-icteric. EARS, NOSE, MOUTH AND THROAT: The oropharynx is clear. The oral mucosa is pink and moist. Hearing is intact to voice. LYMPH NODES:  Lymph nodes in the neck are normal. RESPIRATORY:  Lungs are clear. There is normal respiratory effort, with equal breath sounds bilaterally, and without pathologic use of accessory muscles. CARDIOVASCULAR: Heart is regular without murmurs, gallops, or rubs. GI: The abdomen is soft,Mild TTP left inguinal reguion. I can not definitely feel a hernia defect however exam is limited due to his large body habitus . He does have a 3 cm reducible UH. GU: Rectal deferred.   MUSCULOSKELETAL: Normal muscle strength and tone. No cyanosis or edema.   SKIN: Turgor is good and there are no pathologic skin lesions or ulcers. NEUROLOGIC: Motor and sensation is grossly normal. Cranial nerves are grossly intact. PSYCH:  Oriented to person, place and time. Affect is normal.  Data Reviewed  I have personally reviewed the patient's  imaging, laboratory findings and medical records.    Assessment/Plan Left inguinal pain c/w recurrent hernia, Physical exam is not completely conclusive, given that he is obese and this is a recurrence I will obtain CT A/P to confirm my suspected diagnosis. That will evaluate any other potential problems that he might had with his previous surgery. I will see him back in a week , we will also repeat CBC, INR and CMP given his cirrhosis. HE may need  cards and pulmonary clearance before surgical intervention. D/W the pt in detail and he understands/. No need for emergent surgical intervention.    Caroleen Hamman, MD FACS General Surgeon 05/18/2018, 9:59 AM

## 2018-05-19 ENCOUNTER — Telehealth: Payer: Self-pay

## 2018-05-19 NOTE — Telephone Encounter (Signed)
Lmov for patient to call and schedule ED fu  Seen on 05/17/18 for CP  Will try again at a later time

## 2018-05-20 NOTE — Telephone Encounter (Signed)
Patient is going to another provider

## 2018-05-25 ENCOUNTER — Ambulatory Visit
Admission: RE | Admit: 2018-05-25 | Discharge: 2018-05-25 | Disposition: A | Discharge status: Home / Self Care | Payer: Medicare HMO | Source: Ambulatory Visit | Attending: Surgery | Admitting: Surgery

## 2018-05-25 DIAGNOSIS — I700 Atherosclerosis of aorta: Secondary | ICD-10-CM | POA: Diagnosis not present

## 2018-05-25 DIAGNOSIS — I2510 Atherosclerotic heart disease of native coronary artery without angina pectoris: Secondary | ICD-10-CM | POA: Diagnosis not present

## 2018-05-25 DIAGNOSIS — N289 Disorder of kidney and ureter, unspecified: Secondary | ICD-10-CM | POA: Insufficient documentation

## 2018-05-25 DIAGNOSIS — K4090 Unilateral inguinal hernia, without obstruction or gangrene, not specified as recurrent: Secondary | ICD-10-CM | POA: Diagnosis not present

## 2018-05-25 MED ORDER — IOPAMIDOL (ISOVUE-300) INJECTION 61%
100.0000 mL | Freq: Once | INTRAVENOUS | Status: AC | PRN
  Administered 2018-05-25: 80 mL via INTRAVENOUS

## 2018-05-28 ENCOUNTER — Telehealth: Payer: Self-pay

## 2018-05-28 NOTE — Telephone Encounter (Signed)
Pabon, Marjory Lies, MD  Wayna Chalet, CMA        Please let the pt know that he does have bilateral inguinal hernias on CT. May f/u as prev. Scheduled    Tried to call patient but was not able to leave him a voicemail. Patient is scheduled to come in 06/02/2018 and it shows that he had confirmed his visit.

## 2018-05-31 ENCOUNTER — Ambulatory Visit (INDEPENDENT_AMBULATORY_CARE_PROVIDER_SITE_OTHER): Payer: Medicare HMO | Admitting: Family Medicine

## 2018-05-31 ENCOUNTER — Encounter: Payer: Self-pay | Admitting: Family Medicine

## 2018-05-31 VITALS — BP 130/82 | HR 70 | Temp 98.3°F | Ht 72.0 in | Wt 270.6 lb

## 2018-05-31 DIAGNOSIS — J449 Chronic obstructive pulmonary disease, unspecified: Secondary | ICD-10-CM | POA: Diagnosis not present

## 2018-05-31 DIAGNOSIS — I25119 Atherosclerotic heart disease of native coronary artery with unspecified angina pectoris: Secondary | ICD-10-CM | POA: Diagnosis not present

## 2018-05-31 DIAGNOSIS — M159 Polyosteoarthritis, unspecified: Secondary | ICD-10-CM | POA: Insufficient documentation

## 2018-05-31 DIAGNOSIS — F419 Anxiety disorder, unspecified: Secondary | ICD-10-CM

## 2018-05-31 DIAGNOSIS — E785 Hyperlipidemia, unspecified: Secondary | ICD-10-CM | POA: Insufficient documentation

## 2018-05-31 HISTORY — DX: Atherosclerotic heart disease of native coronary artery with unspecified angina pectoris: I25.119

## 2018-05-31 LAB — CBC WITH DIFFERENTIAL/PLATELET
BASOS PCT: 0.7 % (ref 0.0–3.0)
Basophils Absolute: 0.1 10*3/uL (ref 0.0–0.1)
EOS ABS: 0.3 10*3/uL (ref 0.0–0.7)
Eosinophils Relative: 3.2 % (ref 0.0–5.0)
HEMATOCRIT: 34.9 % — AB (ref 39.0–52.0)
Hemoglobin: 11.6 g/dL — ABNORMAL LOW (ref 13.0–17.0)
Lymphocytes Relative: 24.2 % (ref 12.0–46.0)
Lymphs Abs: 2 10*3/uL (ref 0.7–4.0)
MCHC: 33.1 g/dL (ref 30.0–36.0)
MCV: 98.4 fl (ref 78.0–100.0)
Monocytes Absolute: 0.5 10*3/uL (ref 0.1–1.0)
Monocytes Relative: 6.2 % (ref 3.0–12.0)
NEUTROS ABS: 5.4 10*3/uL (ref 1.4–7.7)
Neutrophils Relative %: 65.7 % (ref 43.0–77.0)
PLATELETS: 194 10*3/uL (ref 150.0–400.0)
RBC: 3.55 Mil/uL — ABNORMAL LOW (ref 4.22–5.81)
RDW: 14.6 % (ref 11.5–15.5)
WBC: 8.2 10*3/uL (ref 4.0–10.5)

## 2018-05-31 LAB — LIPID PANEL
CHOL/HDL RATIO: 2
Cholesterol: 95 mg/dL (ref 0–200)
HDL: 44.5 mg/dL (ref >39.00)
LDL Cholesterol: 28 mg/dL (ref 0–99)
NonHDL: 50.1
TRIGLYCERIDES: 113 mg/dL (ref 0.0–149.0)
VLDL: 22.6 mg/dL (ref 0.0–40.0)

## 2018-05-31 MED ORDER — BUDESONIDE-FORMOTEROL FUMARATE 160-4.5 MCG/ACT IN AERO: 2.0000 | INHALATION_SPRAY | Freq: Two times a day (BID) | RESPIRATORY_TRACT | 3 refills | Status: DC

## 2018-05-31 MED ORDER — CELECOXIB 200 MG PO CAPS: 200.0000 mg | ORAL_CAPSULE | Freq: Every day | ORAL | 1 refills | Status: DC

## 2018-05-31 NOTE — Progress Notes (Signed)
Subjective:    Patient ID: Christopher Orr, male    DOB: 1946/03/17, 72 y.o.   MRN: [id number]  HPI  Presents to clinic to establish care with new PCP.   Patient is a long list of medical conditions including COPD, viral hepatitis C (treated with Harvoni), cirrhosis of liver, GERD, BPH, anxiety, drug and alcohol use/abuse (no longer drinks or uses drugs, has not in 3 years), bipolar disorder, anxiety, depression.  Patient follows with Dr. Randel Books at [facility] mental health services in [place] regularly. Is on prozac, trazodone and Abilify.   Follows with urology for BPH.  Patient is treated by GI for hepatitis C and chronic liver cirrhosis.  Patient takes Spiriva for COPD, does note he feels a little bit more winded when walking longer distances in the past few months.  Patient states pain management follow-up chronic pain treatment - multiple joint pain  Patient was recently in ER due to chest pain, ACS was ruled out. His cbc did show a minor anemia, so we will recheck  Has CAD with stenting in coronary artery. Is on Crestor, Plavix & metoprolol.  Flu vaccine UTD for 2019-2020 season  Most recent labs from 04/2018 reviewed: CBC Latest Ref Rng & Units 05/17/2018 04/08/2016 07/30/2014  WBC 4.0 - 10.5 K/uL 7.1 7.8 9.3  Hemoglobin 13.0 - 17.0 g/dL 12.1(L) 13.4 11.1(L)  Hematocrit 39.0 - 52.0 % 35.8(L) 40.3 32.5(L)  Platelets 150 - 400 K/uL 260 260 223   CMP Latest Ref Rng & Units 05/18/2018 05/17/2018 11/08/2015  Glucose 70 - 99 mg/dL 108(H) 96 -  BUN 8 - 23 mg/dL 16 19 -  Creatinine 0.61 - 1.24 mg/dL 1.21 1.18 1.10  Sodium 135 - 145 mmol/L 139 136 -  Potassium 3.5 - 5.1 mmol/L 4.4 4.4 -  Chloride 98 - 111 mmol/L 106 107 -  CO2 22 - 32 mmol/L 23 23 -  Calcium 8.9 - 10.3 mg/dL 9.3 8.9 -  Total Protein 6.5 - 8.1 g/dL 7.6 - -  Total Bilirubin 0.3 - 1.2 mg/dL 0.5 - -  Alkaline Phos 38 - 126 U/L 83 - -  AST 15 - 41 U/L 28 - -  ALT 0 - 44 U/L 22 - -     Past Medical History:    Diagnosis Date  . Alcohol abuse   . Anemia   . Bipolar disorder ([place])   . BPH (benign prostatic hypertrophy) with urinary obstruction   . CAD (coronary artery disease)    a. s/p stent to LAD in 2006 at [facility], EF 60% at that time.  . Cancer [facility])    prostate  . CHF (congestive heart failure) ([place])   . Chronic pancreatitis ([place])   . COPD (chronic obstructive pulmonary disease) ([place])   . Depression   . Diverticulosis   . Drug use   . ED (erectile dysfunction)   . GERD (gastroesophageal reflux disease)   . Hepatitis C   . History of lumbar fusion   . Hyperlipidemia   . Hypertension   . Mitral regurgitation   . PTSD (post-traumatic stress disorder)   . Urge incontinence    Social History   Tobacco Use  . Smoking status: Former Smoker    Packs/day: 1.50    Types: Cigarettes    Start date: 07/28/1962    Last attempt to quit: 07/28/2012    Years since quitting: 5.8  . Smokeless tobacco: Former Network engineer Use Topics  . Alcohol use: Yes  Alcohol/week: 6.0 - 14.0 standard drinks    Types: 6 - 14 Cans of beer per week    Comment: 6pack and fifth of liquor each day   Past Surgical History:  Procedure Laterality Date  . cateract  2013  . CORONARY STENT PLACEMENT  2006  . ESOPHAGOGASTRODUODENOSCOPY N/A 12/22/2014   Procedure: ESOPHAGOGASTRODUODENOSCOPY (EGD);  Surgeon: [name], MD;  Location: [facility] ENDOSCOPY;  Service: Endoscopy;  Laterality: N/A;  . HERNIA REPAIR  2015  . LUMBAR FUSION    . RADIOACTIVE SEED IMPLANT N/A 04/22/2016   Procedure: RADIOACTIVE SEED IMPLANT/BRACHYTHERAPY IMPLANT;  Surgeon: Hollice Espy, MD;  Location: [facility] ORS;  Service: Urology;  Laterality: N/A;  . TONSILLECTOMY     Family History  Problem Relation Age of Onset  . Heart disease Father        Unclear details. Father passed in late 23's 2/2 cancer.  . Colon cancer Father   . Alcohol abuse Sister   . Drug abuse Sister   . Anxiety disorder Sister   . Depression Sister   . Kidney  disease Neg Hx   . Prostate cancer Neg Hx    Review of Systems  Constitutional: Negative for chills, fatigue and fever.  HENT: Negative for congestion, ear pain, sinus pain and sore throat.   Eyes: Negative.   Respiratory: Increase SOB when walking. Negative for cough, and wheezing.   Cardiovascular: Negative for chest pain, palpitations and leg swelling.  Gastrointestinal: Negative for abdominal pain, diarrhea, nausea and vomiting.  Genitourinary: Negative for dysuria, frequency and urgency.  Musculoskeletal: Negative for arthralgias and myalgias.  Skin: Negative for color change, pallor and rash.  Neurological: Negative for syncope, light-headedness and headaches.  Psychiatric/Behavioral: The patient is not nervous/anxious.       Objective:   Physical Exam  Constitutional: He is oriented to person, place, and time. He appears well-developed and well-nourished. No distress.  HENT:  Head: Normocephalic and atraumatic.  Mouth/Throat: Oropharynx is clear and moist. No oropharyngeal exudate.  Eyes: Conjunctivae and EOM are normal. No scleral icterus.  Neck: Neck supple. No JVD present. No tracheal deviation present.  Cardiovascular: Normal rate, regular rhythm and normal heart sounds.  Pulmonary/Chest: Effort normal and breath sounds normal. No respiratory distress.  Musculoskeletal: He exhibits no edema.  Gait normal.   Neurological: He is alert and oriented to person, place, and time.  Skin: Skin is warm and dry. No pallor.  Psychiatric: He has a normal mood and affect. His behavior is normal.  Nursing note and vitals reviewed.     Vitals:   05/31/18 1401  BP: 130/82  Pulse: 70  Temp: 98.3 F (36.8 C)  SpO2: 93%   Assessment & Plan:    COPD - patient will continue Spiriva Respimat.  We will also add Symbicort 2 puffs twice daily for better overall breathing control.  Hyperlipidemia, coronary artery disease- we will get new lipid panel today.  Patient will continue  Crestor, Plavix and metoprolol.  Osteoarthritis of multiple joints - patient will continue regular follow-up with pain clinic.  Refill of Celebrex given.  Anxiety/bipolar disorder-patient will continue to follow with Dr. Randel Books at [facility].  Patient will follow-up here in 3 to 4 weeks for recheck and breathing after adding Symbicort to medication regimen.

## 2018-06-02 ENCOUNTER — Encounter: Payer: Self-pay | Admitting: Surgery

## 2018-06-02 ENCOUNTER — Ambulatory Visit: Payer: Medicare HMO | Admitting: Surgery

## 2018-06-02 ENCOUNTER — Other Ambulatory Visit: Payer: Self-pay

## 2018-06-02 ENCOUNTER — Encounter: Payer: Self-pay | Admitting: *Deleted

## 2018-06-02 VITALS — BP 113/76 | HR 80 | Temp 97.7°F | Resp 14 | Ht 72.0 in | Wt 269.0 lb

## 2018-06-02 DIAGNOSIS — K4021 Bilateral inguinal hernia, without obstruction or gangrene, recurrent: Secondary | ICD-10-CM | POA: Diagnosis not present

## 2018-06-02 NOTE — Progress Notes (Signed)
Dr. Dahlia Byes is requesting patient to have cardiac clearance from Dr. Neoma Laming whom he has seen in the past. The patient reports he is schduled for a follow up appointment with their office the end of next week.   Also, patient needs to have pulmonary clearance from his PCP, Philis Nettle, NP. Patient saw her on 05-31-18.  Will await clearances before we arrange a surgery date per patient's request.   Patient was given a copy of Pre-admission Testing instructions today.

## 2018-06-02 NOTE — Patient Instructions (Addendum)
Umbilical Hernia, Adult A hernia is a bulge of tissue that pushes through an opening between muscles. An umbilical hernia happens in the abdomen, near the belly button (umbilicus). The hernia may contain tissues from the small intestine, large intestine, or fatty tissue covering the intestines (omentum). Umbilical hernias in adults tend to get worse over time, and they require surgical treatment. There are several types of umbilical hernias. You may have:  A hernia located just above or below the umbilicus (indirect hernia). This is the most common type of umbilical hernia in adults.  A hernia that forms through an opening formed by the umbilicus (direct hernia).  A hernia that comes and goes (reducible hernia). A reducible hernia may be visible only when you strain, lift something heavy, or cough. This type of hernia can be pushed back into the abdomen (reduced).  A hernia that traps abdominal tissue inside the hernia (incarcerated hernia). This type of hernia cannot be reduced.  A hernia that cuts off blood flow to the tissues inside the hernia (strangulated hernia). The tissues can start to die if this happens. This type of hernia requires emergency treatment.  What are the causes? An umbilical hernia happens when tissue inside the abdomen presses on a weak area of the abdominal muscles. What increases the risk? You may have a greater risk of this condition if you:  Are obese.  Have had several pregnancies.  Have a buildup of fluid inside your abdomen (ascites).  Have had surgery that weakens the abdominal muscles.  What are the signs or symptoms? The main symptom of this condition is a painless bulge at or near the belly button. A reducible hernia may be visible only when you strain, lift something heavy, or cough. Other symptoms may include:  Dull pain.  A feeling of pressure.  Symptoms of a strangulated hernia may include:  Pain that gets increasingly worse.  Nausea and  vomiting.  Pain when pressing on the hernia.  Skin over the hernia becoming red or purple.  Constipation.  Blood in the stool.  How is this diagnosed? This condition may be diagnosed based on:  A physical exam. You may be asked to cough or strain while standing. These actions increase the pressure inside your abdomen and force the hernia through the opening in your muscles. Your health care provider may try to reduce the hernia by pressing on it.  Your symptoms and medical history.  How is this treated? Surgery is the only treatment for an umbilical hernia. Surgery for a strangulated hernia is done as soon as possible. If you have a small hernia that is not incarcerated, you may need to lose weight before having surgery. Follow these instructions at home:  Lose weight, if told by your health care provider.  Do not try to push the hernia back in.  Watch your hernia for any changes in color or size. Tell your health care provider if any changes occur.  You may need to avoid activities that increase pressure on your hernia.  Do not lift anything that is heavier than 10 lb (4.5 kg) until your health care provider says that this is safe.  Take over-the-counter and prescription medicines only as told by your health care provider.  Keep all follow-up visits as told by your health care provider. This is important. Contact a health care provider if:  Your hernia gets larger.  Your hernia becomes painful. Get help right away if:  You develop sudden, severe pain near the  area of your hernia.  You have pain as well as nausea or vomiting.  You have pain and the skin over your hernia changes color.  You develop a fever. This information is not intended to replace advice given to you by your health care provider. Make sure you discuss any questions you have with your health care provider. Document Released: 12/14/2015 Document Revised: 03/16/2016 Document Reviewed:  12/14/2015 Elsevier Interactive Patient Education  2018 [employer].  Inguinal Hernia, Adult An inguinal hernia is when fat or the intestines push through the area where the leg meets the lower abdomen (groin) and create a rounded lump (bulge). This condition develops over time. There are three types of inguinal hernias. These types include:  Hernias that can be pushed back into the belly (are reducible).  Hernias that are not reducible (are incarcerated).  Hernias that are not reducible and lose their blood supply (are strangulated). This type of hernia requires emergency surgery.  What are the causes? This condition is caused by having a weak spot in the muscles or tissue. This weakness lets the hernia poke through. This condition can be triggered by:  Suddenly straining the muscles of the lower abdomen.  Lifting heavy objects.  Straining to have a bowel movement. Difficult bowel movements (constipation) can lead to this.  Coughing.  What increases the risk? This condition is more likely to develop in:  Men.  Pregnant women.  People who: ? Are overweight. ? Work in jobs that require long periods of standing or heavy lifting. ? Have had an inguinal hernia before. ? Smoke or have lung disease. These factors can lead to long-lasting (chronic) coughing.  What are the signs or symptoms? Symptoms can depend on the size of the hernia. Often, a small inguinal hernia has no symptoms. Symptoms of a larger hernia include:  A lump in the groin. This is easier to see when the person is standing. It might not be visible when he or she is lying down.  Pain or burning in the groin. This occurs especially when lifting, straining, or coughing.  A dull ache or a feeling of pressure in the groin.  A lump in the scrotum in men.  Symptoms of a strangulated inguinal hernia can include:  A bulge in the groin that is very painful and tender to the touch.  A bulge that turns red or  purple.  Fever, nausea, and vomiting.  The inability to have a bowel movement or to pass gas.  How is this diagnosed? This condition is diagnosed with a medical history and physical exam. Your health care provider may feel your groin area and ask you to cough. How is this treated? Treatment for this condition varies depending on the size of your hernia and whether you have symptoms. If you do not have symptoms, your health care provider may have you watch your hernia carefully and come in for follow-up visits. If your hernia is larger or if you have symptoms, your treatment will include surgery. Follow these instructions at home: Lifestyle  Drink enough fluid to keep your urine clear or pale yellow.  Eat a diet that includes a lot of fiber. Eat plenty of fruits, vegetables, and whole grains. Talk with your health care provider if you have questions.  Avoid lifting heavy objects.  Avoid standing for long periods of time.  Do not use tobacco products, including cigarettes, chewing tobacco, or e-cigarettes. If you need help quitting, ask your health care provider.  Maintain a  healthy weight. General instructions  Do not try to force the hernia back in.  Watch your hernia for any changes in color or size. Let your health care provider know if any changes occur.  Take over-the-counter and prescription medicines only as told by your health care provider.  Keep all follow-up visits as told by your health care provider. This is important. Contact a health care provider if:  You have a fever.  You have new symptoms.  Your symptoms get worse. Get help right away if:  You have pain in the groin that suddenly gets worse.  A bulge in the groin gets bigger suddenly and does not go down.  You are a man and you have a sudden pain in the scrotum, or the size of your scrotum suddenly changes.  A bulge in the groin area becomes red or purple and is painful to the touch.  You have  nausea or vomiting that does not go away.  You feel your heart beating a lot more quickly than normal.  You cannot have a bowel movement or pass gas. This information is not intended to replace advice given to you by your health care provider. Make sure you discuss any questions you have with your health care provider. Document Released: 11/30/2008 Document Revised: 12/20/2015 Document Reviewed: 05/24/2014 Elsevier Interactive Patient Education  2018 [employer].

## 2018-06-03 ENCOUNTER — Encounter: Payer: Self-pay | Admitting: *Deleted

## 2018-06-03 ENCOUNTER — Other Ambulatory Visit: Payer: Self-pay | Admitting: Family Medicine

## 2018-06-03 DIAGNOSIS — J449 Chronic obstructive pulmonary disease, unspecified: Secondary | ICD-10-CM

## 2018-06-03 DIAGNOSIS — Z01818 Encounter for other preprocedural examination: Secondary | ICD-10-CM

## 2018-06-03 NOTE — Progress Notes (Signed)
Per Sharee Pimple at [facility] office, she does not give pulmonary clearances.   Sharee Pimple with check with Philis Nettle to see who she would recommend for patient to see and send referral so we can obtain pulmonary clearance.

## 2018-06-03 NOTE — Progress Notes (Signed)
Pulmonary clearance needed for hernia surgery

## 2018-06-04 ENCOUNTER — Encounter: Payer: Self-pay | Admitting: Surgery

## 2018-06-04 NOTE — Progress Notes (Signed)
Outpatient Surgical Follow Up  06/04/2018  Christopher Orr is an 72 y.o. male.   Chief Complaint  Patient presents with  . Follow-up    ct scan done    HPI: 39 obese male with a history of coronary artery disease, COPD and cirrhosis.  Recently with recurrent and symptomatic left inguinal hernia.  I have ordered a CT scan of the abdomen and pelvis that I have personally reviewed showing evidence of bilateral inguinal hernias.  Also evidence of an umbilical hernia. Does have dyspnea on exertion.  He is on both aspirin and Plavix. HE continues to have left inguinal sharp and moderatepain that radiate to the left thigh BMP and INR is normal.  There is no evidence of ascites. Mild Cirrhosis cirrhosis only child's A.    Past Medical History:  Diagnosis Date  . Alcohol abuse   . Anemia   . Bipolar disorder ([place])   . BPH (benign prostatic hypertrophy) with urinary obstruction   . CAD (coronary artery disease)    a. s/p stent to LAD in 2006 at [facility], EF 60% at that time.  . Cancer [facility])    prostate  . CHF (congestive heart failure) ([place])   . Chronic pancreatitis ([place])   . COPD (chronic obstructive pulmonary disease) ([place])   . Depression   . Diverticulosis   . Drug use   . ED (erectile dysfunction)   . GERD (gastroesophageal reflux disease)   . Hepatitis C   . History of lumbar fusion   . Hyperlipidemia   . Hypertension   . Mitral regurgitation   . PTSD (post-traumatic stress disorder)   . Urge incontinence     Past Surgical History:  Procedure Laterality Date  . cateract  2013  . CORONARY STENT PLACEMENT  2006  . ESOPHAGOGASTRODUODENOSCOPY N/A 12/22/2014   Procedure: ESOPHAGOGASTRODUODENOSCOPY (EGD);  Surgeon: [name], MD;  Location: [facility] ENDOSCOPY;  Service: Endoscopy;  Laterality: N/A;  . HERNIA REPAIR  2015  . LUMBAR FUSION    . RADIOACTIVE SEED IMPLANT N/A 04/22/2016   Procedure: RADIOACTIVE SEED IMPLANT/BRACHYTHERAPY IMPLANT;  Surgeon: Hollice Espy, MD;   Location: [facility] ORS;  Service: Urology;  Laterality: N/A;  . TONSILLECTOMY      Family History  Problem Relation Age of Onset  . Heart disease Father        Unclear details. Father passed in late 22's 2/2 cancer.  . Colon cancer Father   . Alcohol abuse Sister   . Drug abuse Sister   . Anxiety disorder Sister   . Depression Sister   . Kidney disease Neg Hx   . Prostate cancer Neg Hx     Social History:  reports that he quit smoking about 5 years ago. His smoking use included cigarettes. He started smoking about 55 years ago. He smoked 1.50 packs per day. He has quit using smokeless tobacco. He reports that he drinks about 6.0 - 14.0 standard drinks of alcohol per week. He reports that he has current or past drug history. Drug: Marijuana.  Allergies: No Known Allergies  Medications reviewed.    ROS Full ROS performed and is otherwise negative other than what is stated in HPI   BP 113/76   Pulse 80   Temp 97.7 F (36.5 C) (Skin)   Resp 14   Ht 6' (1.829 m)   Wt 269 lb (122 kg)   SpO2 98%   BMI 36.48 kg/m   Physical Exam  Constitutional: He is oriented to person,  place, and time. He appears well-developed and well-nourished.  Neck: Normal range of motion. Neck supple. No tracheal deviation present. No thyromegaly present.  Cardiovascular: Normal rate, regular rhythm and normal heart sounds.  Pulmonary/Chest: Effort normal and breath sounds normal. No stridor. No respiratory distress. He has no wheezes. He has no rales.  Abdominal: Soft. He exhibits no distension and no mass. There is no tenderness. There is no guarding.  Educible large umbilical hernia.  Pain on the left inguinal area.  Due to the his body habitus exam is limited and I cannot definitively tell a hernia defect  Genitourinary: Penis normal.  Musculoskeletal: Normal range of motion.  Neurological: He is alert and oriented to person, place, and time. No cranial nerve deficit. He exhibits normal muscle tone.  Coordination normal.  Skin: Skin is warm and dry. Capillary refill takes less than 2 seconds.  Psychiatric: He has a normal mood and affect. His behavior is normal. Judgment and thought content normal.  Nursing note and vitals reviewed.    Assessment/Plan: 72 year old male with symptomatic left inguinal hernia that is recurrent and asymptomatic right inguinal hernia as well as an umbilical hernia.  He has multiple comorbidities including COPD and coronary artery disease.  Discussed with the patient in detail given his symptoms I do recommend surgical intervention.  I do think that he is a very good candidate for robotic surgery.  Procedure discussed with the patient in detail.  Risk benefit and possible complications including but not limited to: Bleeding, recurrence, pain, bowel injury, persistence of symptoms.  He understands and wishes to proceed.  We will obviously obtain cardiology and pulmonary preoperative evaluation for optimization. Also discussed with the patient about stopping Plavix and aspirin about 7 days before the operation Greater than 50% of the 25 minutes  visit was spent in counseling/coordination of care   Caroleen Hamman, MD [place] Surgeon

## 2018-06-07 ENCOUNTER — Encounter: Payer: Self-pay | Admitting: *Deleted

## 2018-06-07 NOTE — Progress Notes (Signed)
Patient has been scheduled for an appointment with Dr. Vernard Gambles at [facility] for 06-08-18.  A pulmonary clearance request has been forwarded to their office.

## 2018-06-08 ENCOUNTER — Telehealth: Payer: Self-pay | Admitting: *Deleted

## 2018-06-08 ENCOUNTER — Encounter: Payer: Self-pay | Admitting: Pulmonary Disease

## 2018-06-08 ENCOUNTER — Ambulatory Visit (INDEPENDENT_AMBULATORY_CARE_PROVIDER_SITE_OTHER): Payer: Medicare HMO | Admitting: Pulmonary Disease

## 2018-06-08 VITALS — BP 132/82 | HR 68 | Ht 72.0 in | Wt 266.8 lb

## 2018-06-08 DIAGNOSIS — Z6836 Body mass index (BMI) 36.0-36.9, adult: Secondary | ICD-10-CM | POA: Diagnosis not present

## 2018-06-08 DIAGNOSIS — Z01811 Encounter for preprocedural respiratory examination: Secondary | ICD-10-CM

## 2018-06-08 DIAGNOSIS — K219 Gastro-esophageal reflux disease without esophagitis: Secondary | ICD-10-CM | POA: Diagnosis not present

## 2018-06-08 DIAGNOSIS — J449 Chronic obstructive pulmonary disease, unspecified: Secondary | ICD-10-CM

## 2018-06-08 MED ORDER — SPACER/AERO-HOLDING CHAMBERS DEVI: 1.0000 | Freq: Every day | 0 refills | Status: DC

## 2018-06-08 NOTE — Progress Notes (Signed)
Subjective:    Patient ID: Christopher Orr, male    DOB: 1945-09-18, 72 y.o.   MRN: [id number]  HPI the patient is a 72 year old former smoker (quit three years ago) who presents for a preoperative pulmonary evaluation prior to bilateral inguinal hernia repair. The patient is kindly referred by Dr. Dahlia Byes. The patient has a history of chronic obstructive pulmonary disease, FEV1 unknown. He does not describe any increased dyspnea with activities of daily living. He states that his limitation with regards to exercise is pain from his inguinal hernia's. He does not describe dyspnea as a cause for limitation. She does describe vocational cough particularly in the mornings, productive of yellowish sputum however this is not a frequent issue. He does not describe a history consistent with chronic bronchitis. He has not had any chest pain, paroxysmal nocturnal dyspnea, orthopnea or lower extremity edema. He does not notice any issues with wheezing. He states that he has been using Spiriva Respimat for several months but has actually been using it twice a day. He also has been using Symbicort which was a recent start, to inhalations twice a day. This is the 160/4.5 strength. He feels the Symbicort helps him the most. The improvement that he notes his ability to clear sputum.  The patient is only complaint is issues with gastroesophageal reflux which she controls by watching his diet. He does not describe any issues with sleep. He wakes up refreshed in the mornings. No history of snoring or apneas.  The patient smoked one pack of cigarettes per day for 55 years, quit three years ago. 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 He has no military history. He has no pets in the home no unusual hobbies. He has always resided in [place]. He has not had recent travel outside the country and has had no exposure to TB. He did used to smoke marijuana but states that he quit this three  months ago. He states that he drinks "an occasional" beer.  His past medical history, surgical history and family history have been reviewed in detail.   He is up-to-date on flu vaccine.    Review of Systems  Constitutional: Negative.   HENT: Negative.   Eyes: Negative.   Respiratory: Positive for cough (Occasional morning cough, light yellow sputum).   Cardiovascular: Negative.   Gastrointestinal:       Occasional heartburn  Endocrine: Negative.   Genitourinary: Negative.   Musculoskeletal: Negative.   Skin: Negative.   Allergic/Immunologic: Negative.   Neurological: Negative.   Hematological: Negative.   Psychiatric/Behavioral: Negative.   All other systems reviewed and are negative.      Objective:   Physical Exam  Constitutional: He is oriented to person, place, and time. He appears well-developed.  Obese gentleman and no acute respiratory distress.  HENT:  Head: Normocephalic and atraumatic.  Right Ear: External ear normal.  Left Ear: External ear normal.  Mouth/Throat: Oropharynx is clear and moist. No oropharyngeal exudate.  Eyes: Pupils are equal, round, and reactive to light. Conjunctivae are normal. No scleral icterus.  Neck: Neck supple. No JVD present. No tracheal deviation present. No thyromegaly present.  Cardiovascular: Normal rate, regular rhythm, normal heart sounds and intact distal pulses.  No murmur heard. Pulmonary/Chest: Effort normal and breath sounds normal. No stridor. No respiratory distress. He has no wheezes. He has no rales. He exhibits no tenderness.  Abdominal: Soft. Bowel sounds are normal. He exhibits no distension.  Musculoskeletal:  Normal range of motion. He exhibits no edema.  Lymphadenopathy:    He has no cervical adenopathy.  Neurological: He is alert and oriented to person, place, and time.  No focal deficits  Skin: Skin is warm and dry. No rash noted. No erythema. No pallor.  No spider angiomas.  Psychiatric: He has a normal  mood and affect. Thought content normal.  Nursing note and vitals reviewed.   Spirometry was performed and shows an FEV1 of 2.9 L or 82% of predicted. FVC was 4.4 L or 92% predicted. FEV1/FVC was 66%. This is consistent with mild obstructive defect.  I have independently reviewed chest x-ray performed on 21 October and this is consistent with COPD changes.    Assessment & Plan:    1) Chronic Obstructive Pulmonary Disease: by spirometry today, this is mild. The patient appears to be well compensated and there are no other possible interventions to optimize his already optimal regimen. He was instructed on the proper use of the inhalers and was provided a spacer for use with the Symbicort. He was using Spiriva twice a day and this is a once a day medication, he was instructed to use it only once a day. I have little to add to the excellent care provided by his primary care physician in this regard.  2) Preoperative Respiratory Examination: from the pulmonary standpoint the patient is a mild to moderate risk for postoperative pulmonary complications. He is as optimized as he is to get. Potential issues that could also cause respiratory applications would be the issue of gastroesophageal reflux which needs to be monitored closely particularly during induction of anesthesia. Other potential cause of complication will be the patient's obesity and this issue cannot be further optimized at this point.  3) Obesity, BMI 36: this issue adds complexity to his management.  4) Gastroesophageal Reflux Disease: this issue adds complexity to his management.   Thank you for allowing me to participate in this gentleman's care. We'll see him on an as needed basis.

## 2018-06-08 NOTE — Patient Instructions (Signed)
1) Use Symbicort (red inhaler) two puffs twice a day, you have been provided a spacer and instructions to help with dosing the medication. Rinse your mouth well after use.  2) Spiriva (green capped inhaler) should be used two puffs once daily. You set 15 to 20 minutes after you use your Symbicort in the morning

## 2018-06-08 NOTE — Telephone Encounter (Signed)
Patient called to let you know that he is going to [facility] to get clearance on Friday 06/11/18

## 2018-06-09 NOTE — Telephone Encounter (Signed)
We did receive pulmonary clearance from Dr. Vernard Gambles with [facility].   Patient's appointment for cardiac clearance with Dr. Humphrey Rolls is scheduled for 06-11-18. Will await cardiac clearance.   The patient will be contacted once we have received to arrange a date for surgery.

## 2018-06-11 ENCOUNTER — Telehealth: Payer: Self-pay

## 2018-06-11 DIAGNOSIS — F419 Anxiety disorder, unspecified: Secondary | ICD-10-CM

## 2018-06-11 NOTE — Telephone Encounter (Signed)
Referral is in  I do not know if it will apply to last visit with him though

## 2018-06-11 NOTE — Telephone Encounter (Signed)
Pt called, he went to [facility] Dr Randel Books without a referral and he received a bill from [facility] because he did not have a referral. He called today to ask if Philis Nettle would submit a referral to [facility] about that visit

## 2018-06-11 NOTE — Addendum Note (Signed)
Addended by: Philis Nettle on: 06/11/2018 10:30 AM   Modules accepted: Orders

## 2018-06-11 NOTE — Telephone Encounter (Signed)
Copied from [place] [phone]. Topic: General - Other >> Jun 10, 2018  3:24 PM Carolyn Stare wrote:  Pt went to [facility] Dr Randel Books without a referral and he received a bill from [facility] because he did not have a referral. He called today to ask if Philis Nettle would submit a referral to [facility] about that visit

## 2018-06-14 ENCOUNTER — Ambulatory Visit (INDEPENDENT_AMBULATORY_CARE_PROVIDER_SITE_OTHER): Payer: Medicare HMO | Admitting: Family Medicine

## 2018-06-14 ENCOUNTER — Telehealth: Payer: Self-pay | Admitting: Family Medicine

## 2018-06-14 ENCOUNTER — Other Ambulatory Visit: Payer: Self-pay | Admitting: Psychiatry

## 2018-06-14 ENCOUNTER — Encounter: Payer: Self-pay | Admitting: Family Medicine

## 2018-06-14 VITALS — BP 120/82 | HR 72 | Temp 98.1°F | Ht 72.0 in | Wt 267.6 lb

## 2018-06-14 DIAGNOSIS — F419 Anxiety disorder, unspecified: Secondary | ICD-10-CM

## 2018-06-14 DIAGNOSIS — F410 Panic disorder [episodic paroxysmal anxiety] without agoraphobia: Secondary | ICD-10-CM | POA: Insufficient documentation

## 2018-06-14 DIAGNOSIS — F1320 Sedative, hypnotic or anxiolytic dependence, uncomplicated: Secondary | ICD-10-CM | POA: Diagnosis not present

## 2018-06-14 DIAGNOSIS — F4310 Post-traumatic stress disorder, unspecified: Secondary | ICD-10-CM | POA: Diagnosis not present

## 2018-06-14 DIAGNOSIS — F339 Major depressive disorder, recurrent, unspecified: Secondary | ICD-10-CM | POA: Insufficient documentation

## 2018-06-14 HISTORY — DX: Major depressive disorder, recurrent, unspecified: F33.9

## 2018-06-14 MED ORDER — ALPRAZOLAM 2 MG PO TABS: 2.0000 mg | ORAL_TABLET | Freq: Every evening | ORAL | 0 refills | Status: DC | PRN

## 2018-06-14 MED ORDER — FLUOXETINE HCL 60 MG PO TABS: 1.0000 | ORAL_TABLET | Freq: Every day | ORAL | 1 refills | Status: DC

## 2018-06-14 MED ORDER — TRAZODONE HCL 150 MG PO TABS: 150.0000 mg | ORAL_TABLET | Freq: Every day | ORAL | 1 refills | Status: DC

## 2018-06-14 MED ORDER — ARIPIPRAZOLE 10 MG PO TABS: 5.0000 mg | ORAL_TABLET | Freq: Every day | ORAL | 0 refills | Status: DC

## 2018-06-14 NOTE — Telephone Encounter (Signed)
Copied from [place] [phone]. Topic: Referral - Request for Referral >> Jun 14, 2018  9:15 AM [name] wrote: Pt is requesting a referral to see a psychiatrist. Pt says that his employer is requesting this.   Pt has not see provider for this concern. Pt is requesting a call back to discuss further.   CB: [phone]

## 2018-06-14 NOTE — Patient Instructions (Addendum)
We are working on the referral. Dr Ernie Hew is out of network according to your insurance. The referral was placed last week -- having a referral may not change the fact that Dr Ernie Hew is out of network with your insurance company

## 2018-06-14 NOTE — Telephone Encounter (Signed)
>>   Jun 14, 2018  9:15 AM [name] wrote: Pt called and  is requesting for a  referral to see a psychiatrist. Pt says that his employer is requesting this.   Pt has not see provider for this concern. Pt is requesting a call back to discuss further.

## 2018-06-14 NOTE — Telephone Encounter (Signed)
Patient has seen Dr Shea Evans at [facility] -- last seen back in Summer 2019  Is he still going there? They are the main clinic in this area for mental health

## 2018-06-14 NOTE — Telephone Encounter (Signed)
Called Pt he stated he has been going to see the Psychiatrist for 10 years. He stated he saw the Dr's assistant last month and was only given a 30 day supply meds. Pt stated he was told he could not come back to see them without a referral from his doctor and he doesn't know who the doctor was years ago that referred him.

## 2018-06-14 NOTE — Progress Notes (Signed)
Subjective:    Patient ID: Christopher Orr, male    DOB: 07/23/1946, 72 y.o.   MRN: [id number]  HPI  Presents to clinic to discuss anxiety, depression, PTSD, panic attacks.  He has been seeing Dr Ernie Hew at [facility] for his mental health needs for 10+ years, he is a Teacher, music. Patient has had issues with his insurance due to Dr Ernie Hew being out of network. He requested a referral last week, which we did, but still does not understand why he got a denial letter from his insurance about seeing Dr Ernie Hew.   Patient is almost out of his abilify, trazodone, prozac and xanax and he is becoming very nervous that his insurance will not allow him to see Dr Ernie Hew & that he will run out his medications.   Patient states overall his mood is well controlled on Abilify, trazodone, Prozac and Xanax.  Patient states that these medicines he begins to have panic attacks, flashbacks to his time in prison and it is not good if he runs out of his medication.  Patient Active Problem List   Diagnosis Date Noted  . Coronary artery disease involving native heart with angina pectoris ([place]) 05/31/2018  . Osteoarthritis of multiple joints 05/31/2018  . Hyperlipidemia 05/31/2018  . Chronic viral hepatitis C ([place]) 09/24/2014  . Chest pain 07/28/2014  . COPD (chronic obstructive pulmonary disease) ([place]) 07/28/2014  . Anxiety 07/28/2014  . Aspiration pneumonia ([place]) 07/28/2014  . Elevated troponin I level 07/28/2014  . Acute kidney injury ([place]) 07/28/2014  . GERD (gastroesophageal reflux disease) 07/28/2014   Social History   Tobacco Use  . Smoking status: Former Smoker    Packs/day: 1.50    Types: Cigarettes    Start date: 07/28/1962    Last attempt to quit: 07/28/2012    Years since quitting: 5.8  . Smokeless tobacco: Never Used  Substance Use Topics  . Alcohol use: Yes    Alcohol/week: 6.0 - 14.0 standard drinks    Types: 6 - 14 Cans of beer per week    Comment: 6pack and fifth of liquor each day   Review  of Systems   Constitutional: Negative for chills, fatigue and fever.  HENT: Negative for congestion, ear pain, sinus pain and sore throat.   Eyes: Negative.   Respiratory: Negative for cough, shortness of breath and wheezing.   Cardiovascular: Negative for chest pain, palpitations and leg swelling.  Gastrointestinal: Negative for abdominal pain, diarrhea, nausea and vomiting.  Genitourinary: Negative for dysuria, frequency and urgency.  Musculoskeletal: Negative for arthralgias and myalgias.  Skin: Negative for color change, pallor and rash.  Neurological: Negative for syncope, light-headedness and headaches.  Psychiatric/Behavioral: The patient is nervous/anxious.       Objective:   Physical Exam  Constitutional: He is oriented to person, place, and time. He appears well-nourished. No distress.  HENT:  Head: Normocephalic and atraumatic.  Eyes: Conjunctivae and EOM are normal.  Neck: Neck supple. No tracheal deviation present.  Cardiovascular: Normal rate and regular rhythm.  Pulmonary/Chest: Effort normal and breath sounds normal.  Musculoskeletal: He exhibits no edema.  Gait normal.   Neurological: He is alert and oriented to person, place, and time.  Skin: Skin is warm and dry. No pallor.  Psychiatric: He has a normal mood and affect. His behavior is normal. Judgment and thought content normal.  No SI or HI  Nursing note and vitals reviewed.     Vitals:   06/14/18 1422  BP: 120/82  Pulse: 72  Temp: 98.1 F (36.7 C)  SpO2: 90%   Assessment & Plan:   Anxiety, PTSD, major depressive disorder, panic attacks, benzodiazepine dependence-patient given refills on trazodone, Paxil, Abilify and Xanax.  [facility] PMP registry checked and is appropriate for Xanax refill at this time.  Patient aware that I have placed the referral to Dr. Randel Books, but the referral may not change the fact that Dr. Randel Books is out of network by his insurance.  Being out of network means that he most  likely will have to pay more out of pocket to see Dr. Randel Books, or his other choice would be to switch to an in network psychiatric physician.  Our referral coordinator is working on the referral and will be contacting patient with information regarding the referral.  Patient reassured that he will have enough medication to get through [place] trying to figure out what his insurance company requires to have him continue seeing Dr. Randel Books.  Keep regularly scheduled follow-up with me as already planned.  Return to clinic sooner if any issues arise.

## 2018-06-14 NOTE — Telephone Encounter (Signed)
Please advise 

## 2018-06-15 ENCOUNTER — Encounter: Payer: Self-pay | Admitting: Family Medicine

## 2018-06-15 ENCOUNTER — Other Ambulatory Visit: Payer: Self-pay | Admitting: Psychiatry

## 2018-06-15 NOTE — Telephone Encounter (Signed)
Christopher Orr reached out to his insurance  [name] is out of network so his appts with him are not covered

## 2018-06-15 NOTE — Telephone Encounter (Signed)
I placed the referral. It is in epic. It was done 11/15

## 2018-06-15 NOTE — Telephone Encounter (Signed)
Pt calling stating that [name], Jeannette How, MD office does't have a referral for him there. Pt stated he has an appt on Monday with Elta Guadeloupe A [name].

## 2018-06-15 NOTE — Telephone Encounter (Signed)
FYI

## 2018-06-15 NOTE — Telephone Encounter (Signed)
Called Dr. Ernie Hew office and the secretary stated that the Pt misunderstood what was said. She stated that he would have to come in to do a Pt access and they do them only on certain days. Those appt are scheduled if a Pt hasn't seen the doctor in a while. So she said she doesn't know who told him they didn't have his referral. So I then called Pt and explained eveyrthing to him.

## 2018-06-15 NOTE — Telephone Encounter (Signed)
Pt states that he has an appt with [name], Jeannette How, MD on Monday

## 2018-06-15 NOTE — Telephone Encounter (Signed)
Pt calling stating that [name], Jeannette How, MD office does't have a referral for him there please call pt at [phone]

## 2018-06-17 NOTE — Telephone Encounter (Signed)
Note to Caryl-Lyn to call Dr. April Manson office tomorrow to see if cardiac clearance is available. The patient reports he had an ECHO and stress test and was seen in their office today for follow up.

## 2018-06-21 ENCOUNTER — Telehealth: Payer: Self-pay | Admitting: *Deleted

## 2018-06-21 NOTE — Telephone Encounter (Signed)
Pulmonary and cardiac clearances have been faxed to the Pre-admission Testing Department today.

## 2018-06-21 NOTE — Telephone Encounter (Signed)
-----   Message from Lesly Rubenstein, LPN sent at 67/67/2094 11:51 AM EST ----- Clearance has been faxed, it's on his chart in your basket.   ----- Message ----- From: Dominga Ferry, CMA Sent: 06/17/2018   5:26 PM EST To: Lesly Rubenstein, LPN  Please follow up with [facility] office tomorrow to see if they have cardiac clearance available. Patient states he has had an ECHO and stress test and was seen for follow up today. Thanks.

## 2018-06-21 NOTE — Telephone Encounter (Signed)
Patient was contacted today and notified that we received cardiac clearance from Dr. April Manson office. Per Dr. Humphrey Rolls, patient needs to be off Plavix 7 days prior to surgery. Patient aware and will continue 81 mg aspirin once daily.   The patient had previously received pulmonary clearance from [facility].   Patient's surgery to be scheduled for 07-06-18 at [facility] with Dr. Dahlia Byes.   The patient will be contacted to confirm surgery date once posted and informed of Pre-admit appointment date and time.

## 2018-06-21 NOTE — Telephone Encounter (Signed)
Patient aware surgery scheduled for 07-06-18.   He will Pre-admit at [facility] on 07-01-18 at 9:30 am. Patient aware of date, time, and instructions.   Patient was again reminded to discontinue Plavix 7 days prior to surgery.   The patient was instructed to call the office should he have further questions.

## 2018-06-28 ENCOUNTER — Ambulatory Visit: Payer: Medicare HMO | Admitting: Family Medicine

## 2018-07-01 ENCOUNTER — Encounter
Admission: RE | Admit: 2018-07-01 | Discharge: 2018-07-01 | Disposition: A | Discharge status: Home / Self Care | Payer: Medicare HMO | Source: Ambulatory Visit | Attending: Surgery | Admitting: Surgery

## 2018-07-01 ENCOUNTER — Other Ambulatory Visit: Payer: Self-pay

## 2018-07-01 DIAGNOSIS — K4021 Bilateral inguinal hernia, without obstruction or gangrene, recurrent: Secondary | ICD-10-CM

## 2018-07-01 DIAGNOSIS — Z01818 Encounter for other preprocedural examination: Secondary | ICD-10-CM | POA: Insufficient documentation

## 2018-07-01 HISTORY — DX: Dyspnea, unspecified: R06.00

## 2018-07-01 HISTORY — DX: Unspecified osteoarthritis, unspecified site: M19.90

## 2018-07-01 MED ORDER — CHLORHEXIDINE GLUCONATE CLOTH 2 % EX PADS
6.0000 | MEDICATED_PAD | Freq: Once | CUTANEOUS | Status: DC
  Filled 2018-07-01: qty 6

## 2018-07-01 NOTE — Patient Instructions (Signed)
Your procedure is scheduled on: 07/06/18 Tues Report to Same Day Surgery 2nd floor medical mall [facility] Entrance-take elevator on left to 2nd floor.  Check in with surgery information desk.) To find out your arrival time please call [phone] between 1PM - 3PM on 07/05/18 Mon  Remember: Instructions that are not followed completely may result in serious medical risk, up to and including death, or upon the discretion of your surgeon and anesthesiologist your surgery may need to be rescheduled.    _x___ 1. Do not eat food after midnight the night before your procedure. You may drink clear liquids up to 2 hours before you are scheduled to arrive at the hospital for your procedure.  Do not drink clear liquids within 2 hours of your scheduled arrival to the hospital.  Clear liquids include  --Water or Apple juice without pulp  --Clear carbohydrate beverage such as ClearFast or Gatorade  --Black Coffee or Clear Tea (No milk, no creamers, do not add anything to                  the coffee or Tea Type 1 and type 2 diabetics should only drink water.   ____Ensure clear carbohydrate drink on the way to the hospital for bariatric patients  ____Ensure clear carbohydrate drink 3 hours before surgery for Dr Dwyane Luo patients if physician instructed.   No gum chewing or hard candies.     __x__ 2. No Alcohol for 24 hours before or after surgery.   __x__3. No Smoking or e-cigarettes for 24 prior to surgery.  Do not use any chewable tobacco products for at least 6 hour prior to surgery   ____  4. Bring all medications with you on the day of surgery if instructed.    __x__ 5. Notify your doctor if there is any change in your medical condition     (cold, fever, infections).    x___6. On the morning of surgery brush your teeth with toothpaste and water.  You may rinse your mouth with mouth wash if you wish.  Do not swallow any toothpaste or mouthwash.   Do not wear jewelry, make-up, hairpins,  clips or nail polish.  Do not wear lotions, powders, or perfumes. You may wear deodorant.  Do not shave 48 hours prior to surgery. Men may shave face and neck.  Do not bring valuables to the hospital.    [facility] is not responsible for any belongings or valuables.               Contacts, dentures or bridgework may not be worn into surgery.  Leave your suitcase in the car. After surgery it may be brought to your room.  For patients admitted to the hospital, discharge time is determined by your                       treatment team.  _  Patients discharged the day of surgery will not be allowed to drive home.  You will need someone to drive you home and stay with you the night of your procedure.    Please read over the following fact sheets that you were given:   [facility] Preparing for Surgery and or MRSA Information   _x___ Take anti-hypertensive listed below, cardiac, seizure, asthma,     anti-reflux and psychiatric medicines. These include:  1. albuterol (PROVENTIL) (2.5 MG/3ML) 0.083% nebulizer solution  2.ARIPiprazole (ABILIFY) 10 MG tablet  3.budesonide-formoterol (SYMBICORT) 160-4.5 MCG/ACT inhaler  4.FLUoxetine (PROZAC) 20 MG capsule  5.metoprolol succinate (TOPROL-XL) 25 MG 24 hr tablet  6.mirabegron ER (MYRBETRIQ) 50 MG TB24 tablet  7pantoprazole (PROTONIX) 40 MG tablet  8rosuvastatin (CRESTOR) 10 MG tablet  9SPIRIVA RESPIMAT 2.5 MCG/ACT AERS  ____Fleets enema or Magnesium Citrate as directed.   _x___ Use CHG Soap or sage wipes as directed on instruction sheet   ____ Use inhalers on the day of surgery and bring to hospital day of surgery  ____ Stop Metformin and Janumet 2 days prior to surgery.    ____ Take 1/2 of usual insulin dose the night before surgery and none on the morning     surgery.   _x___ Follow recommendations from Cardiologist, Pulmonologist or PCP regarding          stopping Aspirin, Coumadin, Plavix ,Eliquis, Effient, or Pradaxa, and Pletal. Off  Plavix for 10 days per MD instruction  X____Stop Anti-inflammatories such as Advil, Aleve, Ibuprofen, Motrin, Naproxen, Naprosyn, Goodies powders or aspirin products. OK to take Tylenol and   Celebrex.   _x___ Stop supplements until after surgery.  But may continue Vitamin D, Vitamin B,       and multivitamin. Stop fish oils today   ____ Bring C-Pap to the hospital.

## 2018-07-06 ENCOUNTER — Ambulatory Visit: Payer: Medicare HMO

## 2018-07-06 ENCOUNTER — Other Ambulatory Visit: Payer: Self-pay

## 2018-07-06 ENCOUNTER — Encounter
Admission: RE | Disposition: A | Discharge status: Home / Self Care | Payer: Self-pay | Source: Ambulatory Visit | Attending: Internal Medicine

## 2018-07-06 ENCOUNTER — Observation Stay
Admission: RE | Admit: 2018-07-06 | Discharge: 2018-07-07 | Disposition: A | Discharge status: Home / Self Care | Payer: Medicare HMO | Source: Ambulatory Visit | Attending: Internal Medicine | Admitting: Internal Medicine

## 2018-07-06 ENCOUNTER — Encounter: Payer: Self-pay | Admitting: *Deleted

## 2018-07-06 DIAGNOSIS — Z7982 Long term (current) use of aspirin: Secondary | ICD-10-CM | POA: Diagnosis not present

## 2018-07-06 DIAGNOSIS — J9601 Acute respiratory failure with hypoxia: Secondary | ICD-10-CM | POA: Diagnosis not present

## 2018-07-06 DIAGNOSIS — K429 Umbilical hernia without obstruction or gangrene: Secondary | ICD-10-CM | POA: Insufficient documentation

## 2018-07-06 DIAGNOSIS — Z955 Presence of coronary angioplasty implant and graft: Secondary | ICD-10-CM | POA: Insufficient documentation

## 2018-07-06 DIAGNOSIS — I110 Hypertensive heart disease with heart failure: Secondary | ICD-10-CM | POA: Diagnosis not present

## 2018-07-06 DIAGNOSIS — I2510 Atherosclerotic heart disease of native coronary artery without angina pectoris: Secondary | ICD-10-CM | POA: Diagnosis not present

## 2018-07-06 DIAGNOSIS — M1990 Unspecified osteoarthritis, unspecified site: Secondary | ICD-10-CM | POA: Diagnosis not present

## 2018-07-06 DIAGNOSIS — K7460 Unspecified cirrhosis of liver: Secondary | ICD-10-CM | POA: Insufficient documentation

## 2018-07-06 DIAGNOSIS — K4021 Bilateral inguinal hernia, without obstruction or gangrene, recurrent: Principal | ICD-10-CM | POA: Insufficient documentation

## 2018-07-06 DIAGNOSIS — J449 Chronic obstructive pulmonary disease, unspecified: Secondary | ICD-10-CM | POA: Diagnosis not present

## 2018-07-06 DIAGNOSIS — K4090 Unilateral inguinal hernia, without obstruction or gangrene, not specified as recurrent: Secondary | ICD-10-CM | POA: Diagnosis not present

## 2018-07-06 DIAGNOSIS — K4091 Unilateral inguinal hernia, without obstruction or gangrene, recurrent: Secondary | ICD-10-CM

## 2018-07-06 DIAGNOSIS — I509 Heart failure, unspecified: Secondary | ICD-10-CM | POA: Diagnosis not present

## 2018-07-06 DIAGNOSIS — Z79899 Other long term (current) drug therapy: Secondary | ICD-10-CM | POA: Insufficient documentation

## 2018-07-06 DIAGNOSIS — K4020 Bilateral inguinal hernia, without obstruction or gangrene, not specified as recurrent: Secondary | ICD-10-CM | POA: Diagnosis present

## 2018-07-06 DIAGNOSIS — F4310 Post-traumatic stress disorder, unspecified: Secondary | ICD-10-CM | POA: Insufficient documentation

## 2018-07-06 DIAGNOSIS — F319 Bipolar disorder, unspecified: Secondary | ICD-10-CM | POA: Insufficient documentation

## 2018-07-06 DIAGNOSIS — N400 Enlarged prostate without lower urinary tract symptoms: Secondary | ICD-10-CM | POA: Diagnosis not present

## 2018-07-06 DIAGNOSIS — Z7902 Long term (current) use of antithrombotics/antiplatelets: Secondary | ICD-10-CM | POA: Diagnosis not present

## 2018-07-06 DIAGNOSIS — R0902 Hypoxemia: Secondary | ICD-10-CM

## 2018-07-06 DIAGNOSIS — K219 Gastro-esophageal reflux disease without esophagitis: Secondary | ICD-10-CM | POA: Diagnosis not present

## 2018-07-06 DIAGNOSIS — Z87891 Personal history of nicotine dependence: Secondary | ICD-10-CM | POA: Diagnosis not present

## 2018-07-06 HISTORY — PX: UMBILICAL HERNIA REPAIR: SHX196

## 2018-07-06 HISTORY — DX: Headache: R51

## 2018-07-06 HISTORY — PX: ROBOT ASSISTED INGUINAL HERNIA REPAIR: SHX6561

## 2018-07-06 HISTORY — DX: Headache, unspecified: R51.9

## 2018-07-06 LAB — URINE DRUG SCREEN, QUALITATIVE (ARMC ONLY)
Amphetamines, Ur Screen: NOT DETECTED
Barbiturates, Ur Screen: NOT DETECTED
Benzodiazepine, Ur Scrn: POSITIVE — AB
Cannabinoid 50 Ng, Ur ~~LOC~~: POSITIVE — AB
Cocaine Metabolite,Ur ~~LOC~~: NOT DETECTED
MDMA (Ecstasy)Ur Screen: NOT DETECTED
Methadone Scn, Ur: NOT DETECTED
Opiate, Ur Screen: NOT DETECTED
Phencyclidine (PCP) Ur S: NOT DETECTED
Tricyclic, Ur Screen: NOT DETECTED

## 2018-07-06 LAB — GLUCOSE, CAPILLARY: Glucose-Capillary: 131 mg/dL — ABNORMAL HIGH (ref 70–99)

## 2018-07-06 LAB — BASIC METABOLIC PANEL
ANION GAP: 6 (ref 5–15)
BUN: 20 mg/dL (ref 8–23)
CO2: 26 mmol/L (ref 22–32)
Calcium: 8.9 mg/dL (ref 8.9–10.3)
Chloride: 106 mmol/L (ref 98–111)
Creatinine, Ser: 1.19 mg/dL (ref 0.61–1.24)
GFR calc Af Amer: >60 mL/min (ref >60)
GFR calc non Af Amer: >60 mL/min (ref >60)
Glucose, Bld: 159 mg/dL — ABNORMAL HIGH (ref 70–99)
Potassium: 4.8 mmol/L (ref 3.5–5.1)
Sodium: 138 mmol/L (ref 135–145)

## 2018-07-06 LAB — CBC
HEMATOCRIT: 37.5 % — AB (ref 39.0–52.0)
Hemoglobin: 12.2 g/dL — ABNORMAL LOW (ref 13.0–17.0)
MCH: 32.4 pg (ref 26.0–34.0)
MCHC: 32.5 g/dL (ref 30.0–36.0)
MCV: 99.5 fL (ref 80.0–100.0)
Platelets: 281 10*3/uL (ref 150–400)
RBC: 3.77 MIL/uL — AB (ref 4.22–5.81)
RDW: 14.2 % (ref 11.5–15.5)
WBC: 15.2 10*3/uL — AB (ref 4.0–10.5)
nRBC: 0 % (ref 0.0–0.2)

## 2018-07-06 LAB — MAGNESIUM: Magnesium: 2 mg/dL (ref 1.7–2.4)

## 2018-07-06 SURGERY — ROBOT ASSISTED INGUINAL HERNIA REPAIR
Anesthesia: General

## 2018-07-06 MED ORDER — KETAMINE HCL 10 MG/ML IJ SOLN
INTRAMUSCULAR | Status: DC | PRN
  Administered 2018-07-06: 50 mg via INTRAVENOUS

## 2018-07-06 MED ORDER — HYDROMORPHONE HCL 1 MG/ML IJ SOLN
INTRAMUSCULAR | Status: AC
  Administered 2018-07-06: 0.25 mg via INTRAVENOUS
  Filled 2018-07-06: qty 1

## 2018-07-06 MED ORDER — SEVOFLURANE IN SOLN
RESPIRATORY_TRACT | Status: AC
  Filled 2018-07-06: qty 250

## 2018-07-06 MED ORDER — DEXAMETHASONE SODIUM PHOSPHATE 10 MG/ML IJ SOLN
INTRAMUSCULAR | Status: AC
  Filled 2018-07-06: qty 1

## 2018-07-06 MED ORDER — FLUOXETINE HCL 20 MG PO CAPS
60.0000 mg | ORAL_CAPSULE | Freq: Every day | ORAL | Status: DC
  Administered 2018-07-07: 60 mg via ORAL
  Filled 2018-07-06: qty 3

## 2018-07-06 MED ORDER — ONDANSETRON HCL 4 MG/2ML IJ SOLN: 4.0000 mg | Freq: Four times a day (QID) | INTRAMUSCULAR | Status: DC | PRN

## 2018-07-06 MED ORDER — ACETAMINOPHEN 10 MG/ML IV SOLN
INTRAVENOUS | Status: AC
  Filled 2018-07-06: qty 100

## 2018-07-06 MED ORDER — ALBUTEROL SULFATE (2.5 MG/3ML) 0.083% IN NEBU: 2.5000 mg | INHALATION_SOLUTION | RESPIRATORY_TRACT | Status: DC | PRN

## 2018-07-06 MED ORDER — PANTOPRAZOLE SODIUM 40 MG PO TBEC
40.0000 mg | DELAYED_RELEASE_TABLET | Freq: Every day | ORAL | Status: DC
  Administered 2018-07-07: 40 mg via ORAL
  Filled 2018-07-06: qty 1

## 2018-07-06 MED ORDER — SODIUM CHLORIDE 0.9% FLUSH
3.0000 mL | Freq: Two times a day (BID) | INTRAVENOUS | Status: DC
  Administered 2018-07-06 – 2018-07-07 (×2): 3 mL via INTRAVENOUS

## 2018-07-06 MED ORDER — MIDAZOLAM HCL 2 MG/2ML IJ SOLN
INTRAMUSCULAR | Status: AC
  Filled 2018-07-06: qty 2

## 2018-07-06 MED ORDER — MIDAZOLAM HCL 2 MG/2ML IJ SOLN
INTRAMUSCULAR | Status: DC | PRN
  Administered 2018-07-06: 2 mg via INTRAVENOUS

## 2018-07-06 MED ORDER — ONDANSETRON HCL 4 MG PO TABS: 4.0000 mg | ORAL_TABLET | Freq: Four times a day (QID) | ORAL | Status: DC | PRN

## 2018-07-06 MED ORDER — EPHEDRINE SULFATE 50 MG/ML IJ SOLN
INTRAMUSCULAR | Status: DC | PRN
  Administered 2018-07-06 (×2): 5 mg via INTRAVENOUS

## 2018-07-06 MED ORDER — TIOTROPIUM BROMIDE MONOHYDRATE 18 MCG IN CAPS
18.0000 ug | ORAL_CAPSULE | Freq: Every day | RESPIRATORY_TRACT | Status: DC
  Administered 2018-07-07: 18 ug via RESPIRATORY_TRACT
  Filled 2018-07-06: qty 5

## 2018-07-06 MED ORDER — BUPIVACAINE-EPINEPHRINE 0.25% -1:200000 IJ SOLN
INTRAMUSCULAR | Status: DC | PRN
  Administered 2018-07-06: 30 mL

## 2018-07-06 MED ORDER — BUPIVACAINE-EPINEPHRINE (PF) 0.25% -1:200000 IJ SOLN
INTRAMUSCULAR | Status: AC
  Filled 2018-07-06: qty 30

## 2018-07-06 MED ORDER — IPRATROPIUM-ALBUTEROL 0.5-2.5 (3) MG/3ML IN SOLN
3.0000 mL | Freq: Four times a day (QID) | RESPIRATORY_TRACT | Status: DC
  Administered 2018-07-06: 3 mL via RESPIRATORY_TRACT

## 2018-07-06 MED ORDER — PHENYLEPHRINE HCL 10 MG/ML IJ SOLN
INTRAMUSCULAR | Status: DC | PRN
  Administered 2018-07-06: 50 ug via INTRAVENOUS

## 2018-07-06 MED ORDER — PROPOFOL 10 MG/ML IV BOLUS
INTRAVENOUS | Status: AC
  Filled 2018-07-06: qty 20

## 2018-07-06 MED ORDER — CLOPIDOGREL BISULFATE 75 MG PO TABS
75.0000 mg | ORAL_TABLET | Freq: Every day | ORAL | Status: DC
  Administered 2018-07-07: 75 mg via ORAL
  Filled 2018-07-06: qty 1

## 2018-07-06 MED ORDER — BISACODYL 5 MG PO TBEC
5.0000 mg | DELAYED_RELEASE_TABLET | Freq: Every day | ORAL | Status: DC | PRN
  Administered 2018-07-06: 5 mg via ORAL
  Filled 2018-07-06: qty 1

## 2018-07-06 MED ORDER — ROCURONIUM BROMIDE 50 MG/5ML IV SOLN
INTRAVENOUS | Status: AC
  Filled 2018-07-06: qty 1

## 2018-07-06 MED ORDER — HYDROMORPHONE HCL 1 MG/ML IJ SOLN
0.2500 mg | INTRAMUSCULAR | Status: DC | PRN
  Administered 2018-07-06 (×4): 0.25 mg via INTRAVENOUS

## 2018-07-06 MED ORDER — FOLIC ACID 1 MG PO TABS
1.0000 mg | ORAL_TABLET | Freq: Every day | ORAL | Status: DC
  Administered 2018-07-07: 1 mg via ORAL
  Filled 2018-07-06: qty 1

## 2018-07-06 MED ORDER — DEXAMETHASONE SODIUM PHOSPHATE 10 MG/ML IJ SOLN
INTRAMUSCULAR | Status: DC | PRN
  Administered 2018-07-06: 5 mg via INTRAVENOUS

## 2018-07-06 MED ORDER — LACTATED RINGERS IV SOLN
INTRAVENOUS | Status: DC
  Administered 2018-07-06: 20 mL/h via INTRAVENOUS

## 2018-07-06 MED ORDER — KETAMINE HCL 50 MG/ML IJ SOLN
INTRAMUSCULAR | Status: AC
  Filled 2018-07-06: qty 10

## 2018-07-06 MED ORDER — DEXTROSE 5 % IV SOLN
3.0000 g | INTRAVENOUS | Status: AC
  Administered 2018-07-06: 3 g via INTRAVENOUS
  Filled 2018-07-06: qty 3000

## 2018-07-06 MED ORDER — TIOTROPIUM BROMIDE MONOHYDRATE 2.5 MCG/ACT IN AERS: 2.0000 | INHALATION_SPRAY | Freq: Every day | RESPIRATORY_TRACT | Status: DC

## 2018-07-06 MED ORDER — ENOXAPARIN SODIUM 40 MG/0.4ML ~~LOC~~ SOLN
40.0000 mg | SUBCUTANEOUS | Status: DC
  Administered 2018-07-07: 40 mg via SUBCUTANEOUS
  Filled 2018-07-06: qty 0.4

## 2018-07-06 MED ORDER — IPRATROPIUM-ALBUTEROL 0.5-2.5 (3) MG/3ML IN SOLN
3.0000 mL | Freq: Once | RESPIRATORY_TRACT | Status: AC
  Administered 2018-07-06: 3 mL via RESPIRATORY_TRACT

## 2018-07-06 MED ORDER — SUGAMMADEX SODIUM 200 MG/2ML IV SOLN
INTRAVENOUS | Status: AC
  Filled 2018-07-06: qty 2

## 2018-07-06 MED ORDER — HYDROCODONE-ACETAMINOPHEN 5-325 MG PO TABS: 1.0000 | ORAL_TABLET | ORAL | 0 refills | Status: DC | PRN

## 2018-07-06 MED ORDER — ARIPIPRAZOLE 5 MG PO TABS
5.0000 mg | ORAL_TABLET | Freq: Every day | ORAL | Status: DC
  Administered 2018-07-07: 5 mg via ORAL
  Filled 2018-07-06: qty 1

## 2018-07-06 MED ORDER — ASPIRIN 81 MG PO TABS: 81.0000 mg | ORAL_TABLET | Freq: Every day | ORAL | Status: DC

## 2018-07-06 MED ORDER — ONDANSETRON HCL 4 MG/2ML IJ SOLN
INTRAMUSCULAR | Status: DC | PRN
  Administered 2018-07-06: 4 mg via INTRAVENOUS

## 2018-07-06 MED ORDER — SODIUM CHLORIDE 0.9% FLUSH: 3.0000 mL | INTRAVENOUS | Status: DC | PRN

## 2018-07-06 MED ORDER — TAMSULOSIN HCL 0.4 MG PO CAPS
0.4000 mg | ORAL_CAPSULE | Freq: Every day | ORAL | Status: DC
  Administered 2018-07-06: 0.4 mg via ORAL
  Filled 2018-07-06: qty 1

## 2018-07-06 MED ORDER — TRAZODONE HCL 50 MG PO TABS
150.0000 mg | ORAL_TABLET | Freq: Every day | ORAL | Status: DC
  Administered 2018-07-06: 150 mg via ORAL
  Filled 2018-07-06: qty 1

## 2018-07-06 MED ORDER — SODIUM CHLORIDE 0.9 % IV SOLN: 250.0000 mL | INTRAVENOUS | Status: DC | PRN

## 2018-07-06 MED ORDER — ACETAMINOPHEN 10 MG/ML IV SOLN
INTRAVENOUS | Status: DC | PRN
  Administered 2018-07-06: 1000 mg via INTRAVENOUS

## 2018-07-06 MED ORDER — SUGAMMADEX SODIUM 200 MG/2ML IV SOLN
INTRAVENOUS | Status: DC | PRN
  Administered 2018-07-06: 250 mg via INTRAVENOUS

## 2018-07-06 MED ORDER — ACETAMINOPHEN 650 MG RE SUPP: 650.0000 mg | Freq: Four times a day (QID) | RECTAL | Status: DC | PRN

## 2018-07-06 MED ORDER — HYDROCODONE-ACETAMINOPHEN 5-325 MG PO TABS
1.0000 | ORAL_TABLET | ORAL | Status: DC | PRN
  Administered 2018-07-06: 2 via ORAL
  Administered 2018-07-06: 1 via ORAL
  Administered 2018-07-07 (×2): 2 via ORAL
  Filled 2018-07-06 (×3): qty 2

## 2018-07-06 MED ORDER — FENTANYL CITRATE (PF) 100 MCG/2ML IJ SOLN
INTRAMUSCULAR | Status: AC
  Filled 2018-07-06: qty 2

## 2018-07-06 MED ORDER — HYDROCODONE-ACETAMINOPHEN 5-325 MG PO TABS
ORAL_TABLET | ORAL | Status: AC
  Administered 2018-07-06: 1 via ORAL
  Filled 2018-07-06: qty 1

## 2018-07-06 MED ORDER — ONDANSETRON HCL 4 MG/2ML IJ SOLN
INTRAMUSCULAR | Status: AC
  Filled 2018-07-06: qty 2

## 2018-07-06 MED ORDER — SODIUM CHLORIDE (PF) 0.9 % IJ SOLN
INTRAMUSCULAR | Status: AC
  Filled 2018-07-06: qty 10

## 2018-07-06 MED ORDER — ASPIRIN EC 81 MG PO TBEC
81.0000 mg | DELAYED_RELEASE_TABLET | Freq: Every day | ORAL | Status: DC
  Administered 2018-07-07: 81 mg via ORAL
  Filled 2018-07-06: qty 1

## 2018-07-06 MED ORDER — LIDOCAINE HCL (CARDIAC) PF 100 MG/5ML IV SOSY
PREFILLED_SYRINGE | INTRAVENOUS | Status: DC | PRN
  Administered 2018-07-06: 80 mg via INTRAVENOUS
  Administered 2018-07-06: 20 mg via INTRAVENOUS

## 2018-07-06 MED ORDER — IPRATROPIUM-ALBUTEROL 0.5-2.5 (3) MG/3ML IN SOLN
RESPIRATORY_TRACT | Status: AC
  Administered 2018-07-06: 3 mL via RESPIRATORY_TRACT
  Filled 2018-07-06: qty 3

## 2018-07-06 MED ORDER — BUPIVACAINE LIPOSOME 1.3 % IJ SUSP
INTRAMUSCULAR | Status: AC
  Filled 2018-07-06: qty 20

## 2018-07-06 MED ORDER — HYDROCODONE-ACETAMINOPHEN 5-325 MG PO TABS
1.0000 | ORAL_TABLET | Freq: Once | ORAL | Status: AC
  Administered 2018-07-06: 1 via ORAL

## 2018-07-06 MED ORDER — SENNOSIDES-DOCUSATE SODIUM 8.6-50 MG PO TABS: 1.0000 | ORAL_TABLET | Freq: Every evening | ORAL | Status: DC | PRN

## 2018-07-06 MED ORDER — MOMETASONE FURO-FORMOTEROL FUM 200-5 MCG/ACT IN AERO
2.0000 | INHALATION_SPRAY | Freq: Two times a day (BID) | RESPIRATORY_TRACT | Status: DC
  Administered 2018-07-06 – 2018-07-07 (×2): 2 via RESPIRATORY_TRACT
  Filled 2018-07-06: qty 8.8

## 2018-07-06 MED ORDER — FENTANYL CITRATE (PF) 100 MCG/2ML IJ SOLN
INTRAMUSCULAR | Status: DC | PRN
  Administered 2018-07-06 (×2): 50 ug via INTRAVENOUS
  Administered 2018-07-06: 100 ug via INTRAVENOUS

## 2018-07-06 MED ORDER — FENTANYL CITRATE (PF) 250 MCG/5ML IJ SOLN
INTRAMUSCULAR | Status: AC
  Filled 2018-07-06: qty 5

## 2018-07-06 MED ORDER — ROSUVASTATIN CALCIUM 10 MG PO TABS
10.0000 mg | ORAL_TABLET | Freq: Every day | ORAL | Status: DC
  Administered 2018-07-07: 10 mg via ORAL
  Filled 2018-07-06: qty 1

## 2018-07-06 MED ORDER — ACETAMINOPHEN 325 MG PO TABS: 650.0000 mg | ORAL_TABLET | Freq: Four times a day (QID) | ORAL | Status: DC | PRN

## 2018-07-06 MED ORDER — PROPOFOL 10 MG/ML IV BOLUS
INTRAVENOUS | Status: DC | PRN
  Administered 2018-07-06: 170 mg via INTRAVENOUS

## 2018-07-06 MED ORDER — CELECOXIB 200 MG PO CAPS
200.0000 mg | ORAL_CAPSULE | Freq: Every day | ORAL | Status: DC
  Administered 2018-07-07: 200 mg via ORAL
  Filled 2018-07-06: qty 1

## 2018-07-06 MED ORDER — BUPIVACAINE LIPOSOME 1.3 % IJ SUSP
INTRAMUSCULAR | Status: DC | PRN
  Administered 2018-07-06: 20 mL

## 2018-07-06 MED ORDER — PROPOFOL 500 MG/50ML IV EMUL
INTRAVENOUS | Status: AC
  Filled 2018-07-06: qty 50

## 2018-07-06 MED ORDER — HYDROCODONE-ACETAMINOPHEN 5-325 MG PO TABS
ORAL_TABLET | ORAL | Status: AC
  Filled 2018-07-06: qty 1

## 2018-07-06 MED ORDER — ROCURONIUM BROMIDE 100 MG/10ML IV SOLN
INTRAVENOUS | Status: DC | PRN
  Administered 2018-07-06: 15 mg via INTRAVENOUS
  Administered 2018-07-06: 20 mg via INTRAVENOUS
  Administered 2018-07-06: 50 mg via INTRAVENOUS
  Administered 2018-07-06: 10 mg via INTRAVENOUS

## 2018-07-06 MED ORDER — METOPROLOL SUCCINATE ER 25 MG PO TB24
25.0000 mg | ORAL_TABLET | Freq: Every day | ORAL | Status: DC
  Administered 2018-07-07: 25 mg via ORAL
  Filled 2018-07-06: qty 1

## 2018-07-06 MED ORDER — ALPRAZOLAM 0.5 MG PO TABS: 2.0000 mg | ORAL_TABLET | Freq: Every evening | ORAL | Status: DC | PRN

## 2018-07-06 MED ORDER — ORAL CARE MOUTH RINSE
15.0000 mL | Freq: Two times a day (BID) | OROMUCOSAL | Status: DC
  Administered 2018-07-06 – 2018-07-07 (×2): 15 mL via OROMUCOSAL

## 2018-07-06 MED ORDER — MIRABEGRON ER 50 MG PO TB24
50.0000 mg | ORAL_TABLET | Freq: Every day | ORAL | Status: DC
  Administered 2018-07-07: 50 mg via ORAL
  Filled 2018-07-06: qty 1

## 2018-07-06 MED ORDER — IPRATROPIUM-ALBUTEROL 0.5-2.5 (3) MG/3ML IN SOLN
RESPIRATORY_TRACT | Status: AC
  Filled 2018-07-06: qty 3

## 2018-07-06 SURGICAL SUPPLY — 66 items
APPLICATOR COTTON TIP 6 STRL (MISCELLANEOUS) IMPLANT
APPLICATOR COTTON TIP 6IN STRL (MISCELLANEOUS)
CANISTER SUCT 1200ML W/VALVE (MISCELLANEOUS) ×4 IMPLANT
CHLORAPREP W/TINT 26ML (MISCELLANEOUS) ×4 IMPLANT
CORD BIP STRL DISP 12FT (MISCELLANEOUS) ×4 IMPLANT
COVER TIP SHEARS 8 DVNC (MISCELLANEOUS) ×2 IMPLANT
COVER TIP SHEARS 8MM DA VINCI (MISCELLANEOUS) ×2
COVER WAND RF STERILE (DRAPES) ×4 IMPLANT
DEFOGGER SCOPE WARMER CLEARIFY (MISCELLANEOUS) ×4 IMPLANT
DERMABOND ADVANCED (GAUZE/BANDAGES/DRESSINGS) ×2
DERMABOND ADVANCED .7 DNX12 (GAUZE/BANDAGES/DRESSINGS) ×2 IMPLANT
DEVICE SECURE STRAP 25 ABSORB (INSTRUMENTS) IMPLANT
DRAPE 3 ARM ACCESS DA VINCI (DRAPES) ×2
DRAPE 3 ARM ACCESS DVNC (DRAPES) ×2 IMPLANT
DRAPE INCISE IOBAN 66X45 STRL (DRAPES) IMPLANT
DRAPE SHEET LG 3/4 BI-LAMINATE (DRAPES) ×4 IMPLANT
ELECT CAUTERY BLADE 6.4 (BLADE) ×4 IMPLANT
ELECT CAUTERY BLADE TIP 2.5 (TIP) ×4
ELECT REM PT RETURN 9FT ADLT (ELECTROSURGICAL) ×4
ELECTRODE CAUTERY BLDE TIP 2.5 (TIP) ×2 IMPLANT
ELECTRODE REM PT RTRN 9FT ADLT (ELECTROSURGICAL) ×2 IMPLANT
GLOVE BIO SURGEON STRL SZ7 (GLOVE) ×16 IMPLANT
GOWN STRL REUS W/ TWL LRG LVL3 (GOWN DISPOSABLE) ×6 IMPLANT
GOWN STRL REUS W/TWL LRG LVL3 (GOWN DISPOSABLE) ×6
GRASPER SUT TROCAR 14GX15 (MISCELLANEOUS) IMPLANT
HANDLE YANKAUER SUCT BULB TIP (MISCELLANEOUS) IMPLANT
IRRIGATION STRYKERFLOW (MISCELLANEOUS) IMPLANT
IRRIGATOR STRYKERFLOW (MISCELLANEOUS)
IV NS 1000ML (IV SOLUTION)
IV NS 1000ML BAXH (IV SOLUTION) IMPLANT
KIT PINK PAD W/HEAD ARE REST (MISCELLANEOUS) ×4
KIT PINK PAD W/HEAD ARM REST (MISCELLANEOUS) ×2 IMPLANT
L-HOOK LAP DISP 36CM (ELECTROSURGICAL) ×4
LABEL OR SOLS (LABEL) ×4 IMPLANT
LHOOK LAP DISP 36CM (ELECTROSURGICAL) ×2 IMPLANT
MESH 3DMAX 4X6 LT LRG (Mesh General) ×4 IMPLANT
MESH 3DMAX 4X6 RT LRG (Mesh General) ×4 IMPLANT
MESH VENTRALEX ST 1-7/10 CRC S (Mesh General) ×4 IMPLANT
NDL INSUFF ACCESS 14 VERSASTEP (NEEDLE) IMPLANT
NEEDLE HYPO 22GX1.5 SAFETY (NEEDLE) ×4 IMPLANT
NS IRRIG 500ML POUR BTL (IV SOLUTION) ×4 IMPLANT
PACK LAP CHOLECYSTECTOMY (MISCELLANEOUS) ×4 IMPLANT
PENCIL ELECTRO HAND CTR (MISCELLANEOUS) ×4 IMPLANT
PROGRASP ENDOWRIST DA VINCI (INSTRUMENTS) ×2
PROGRASP ENDOWRIST DVNC (INSTRUMENTS) ×2 IMPLANT
SCISSORS METZENBAUM CVD 33 (INSTRUMENTS) IMPLANT
SHEARS HARMONIC ACE PLUS 36CM (ENDOMECHANICALS) IMPLANT
SLEEVE ENDOPATH XCEL 5M (ENDOMECHANICALS) IMPLANT
SOLUTION ELECTROLUBE (MISCELLANEOUS) ×4 IMPLANT
SPONGE LAP 18X18 RF (DISPOSABLE) ×4 IMPLANT
STRAP SAFETY 5IN WIDE (MISCELLANEOUS) IMPLANT
SUT ETHIBOND 0 MO6 C/R (SUTURE) ×8 IMPLANT
SUT MNCRL AB 4-0 PS2 18 (SUTURE) ×4 IMPLANT
SUT VIC AB 0 SH 27 (SUTURE) IMPLANT
SUT VIC AB 2-0 SH 27 (SUTURE) ×4
SUT VIC AB 2-0 SH 27XBRD (SUTURE) ×4 IMPLANT
SUT VIC AB 3-0 SH 27 (SUTURE) ×2
SUT VIC AB 3-0 SH 27X BRD (SUTURE) ×2 IMPLANT
SUT VICRYL 0 AB UR-6 (SUTURE) ×8 IMPLANT
SUT VLOC 90 S/L VL9 GS22 (SUTURE) ×8 IMPLANT
TROCAR BALLN GELPORT 12X130M (ENDOMECHANICALS) ×4 IMPLANT
TROCAR XCEL BLUNT TIP 100MML (ENDOMECHANICALS) IMPLANT
TROCAR XCEL NON-BLD 11X100MML (ENDOMECHANICALS) IMPLANT
TROCAR XCEL NON-BLD 5MMX100MML (ENDOMECHANICALS) IMPLANT
TUBING INSUF HEATED (TUBING) ×4 IMPLANT
TUBING INSUFFLATION (TUBING) ×4 IMPLANT

## 2018-07-06 NOTE — Progress Notes (Signed)
Advanced Care Plan.  Purpose of Encounter: CODE STATUS. Parties in Attendance: The patient, his daughter, RN and me. Patient's Decisional Capacity: Yes. Medical Story: Christopher Orr  is a 72 y.o. male with a known history of alcohol abuse, anemia, arthritis, BPH, CAD, prostate cancer, CHF, chronic pancreatitis, COPD, diverticulosis, GERD, hypertension, hyperlipidemia etc. the patient is being admitted for acute respiratory failure with hypoxia due to anesthesia.  I discussed with the patient about his current condition, prognosis and CODE STATUS.  Patient wants to be resuscitated and intubated if he has cardiopulmonary arrest. Plan:  Code Status: Full code. Time spent discussing advance care planning: 17 minutes.

## 2018-07-06 NOTE — H&P (Signed)
[facility] NAME: Christopher Orr    MR#:  [id number]  DATE OF BIRTH:  [birth year]  DATE OF ADMISSION:  07/06/2018  PRIMARY CARE PHYSICIAN: Jodelle Green, FNP   REQUESTING/REFERRING PHYSICIAN: Dr. Dahlia Byes.  CHIEF COMPLAINT:  No chief complaint on file.  Hypoxia. HISTORY OF PRESENT ILLNESS:  Christopher Orr  is a 72 y.o. male with a known history of alcohol abuse, anemia, arthritis, BPH, CAD, prostate cancer, CHF, chronic pancreatitis, COPD, diverticulosis, GERD, hypertension, hyperlipidemia etc.  The patient underwent robotic assisted laparoscopic bilateral inguinal hernia repair and umbilical hernia repair today.  He was intubated during operation and is extubated thereafter.  He is found hypoxia after extubation, put on BiPAP for about a couple hours and change to oxygen by nasal cannula.  But the patient is still hypoxia at 70s to 80s with out oxygen.  General surgeon Dr. Dahlia Byes requested hospitalist consult for admission.  The patient denies any chest pain, palpitation, shortness breath, wheezing or cough.  He only complains of mild abdominal pain.  He is on oxygen 2 L now. PAST MEDICAL HISTORY:   Past Medical History:  Diagnosis Date  . Alcohol abuse   . Anemia   . Arthritis   . Bipolar disorder ([place])   . BPH (benign prostatic hypertrophy) with urinary obstruction   . CAD (coronary artery disease)    a. s/p stent to LAD in 2006 at [facility], EF 60% at that time.  . Cancer [facility])    prostate  . CHF (congestive heart failure) ([place])   . Chronic pancreatitis ([place])   . COPD (chronic obstructive pulmonary disease) ([place])   . Depression   . Diverticulosis   . Drug use   . Dyspnea   . ED (erectile dysfunction)   . GERD (gastroesophageal reflux disease)   . Headache    occasional migraines  . Hepatitis C    treated  . History of lumbar fusion   . Hyperlipidemia   . Hypertension    patient denies having high blood pressure  . Mitral  regurgitation   . PTSD (post-traumatic stress disorder)   . Urge incontinence     PAST SURGICAL HISTORY:   Past Surgical History:  Procedure Laterality Date  . cateract  2013  . CORONARY STENT PLACEMENT  2006   x 1  . ESOPHAGOGASTRODUODENOSCOPY N/A 12/22/2014   Procedure: ESOPHAGOGASTRODUODENOSCOPY (EGD);  Surgeon: [name], MD;  Location: [facility] ENDOSCOPY;  Service: Endoscopy;  Laterality: N/A;  . HERNIA REPAIR  2015   left groin  . LUMBAR FUSION    . RADIOACTIVE SEED IMPLANT N/A 04/22/2016   Procedure: RADIOACTIVE SEED IMPLANT/BRACHYTHERAPY IMPLANT;  Surgeon: Hollice Espy, MD;  Location: [facility] ORS;  Service: Urology;  Laterality: N/A;  . TONSILLECTOMY      SOCIAL HISTORY:   Social History   Tobacco Use  . Smoking status: Current Every Day Smoker    Packs/day: 1.00    Types: Cigarettes    Start date: 07/28/1962    Last attempt to quit: 07/28/2012    Years since quitting: 5.9  . Smokeless tobacco: Never Used  Substance Use Topics  . Alcohol use: Yes    Alcohol/week: 6.0 - 14.0 standard drinks    Types: 6 - 14 Cans of beer per week    Comment: 6pack and fifth of liquor each day    FAMILY HISTORY:   Family History  Problem Relation Age of Onset  . Heart disease  Father        Unclear details. Father passed in late 51's 2/2 cancer.  . Colon cancer Father   . Alcohol abuse Sister   . Drug abuse Sister   . Anxiety disorder Sister   . Depression Sister   . Kidney disease Neg Hx   . Prostate cancer Neg Hx     DRUG ALLERGIES:  No Known Allergies  REVIEW OF SYSTEMS:   Review of Systems  Constitutional: Negative for chills, fever and malaise/fatigue.  HENT: Negative for sore throat.   Eyes: Negative for blurred vision and double vision.  Respiratory: Negative for cough, hemoptysis, shortness of breath, wheezing and stridor.   Cardiovascular: Negative for chest pain, palpitations, orthopnea and leg swelling.  Gastrointestinal: Negative for abdominal pain,  blood in stool, diarrhea, melena, nausea and vomiting.  Genitourinary: Negative for dysuria, flank pain and hematuria.  Musculoskeletal: Negative for back pain and joint pain.  Skin: Negative for rash.  Neurological: Negative for dizziness, sensory change, focal weakness, seizures, loss of consciousness, weakness and headaches.  Endo/Heme/Allergies: Negative for polydipsia.  Psychiatric/Behavioral: Negative for depression. The patient is not nervous/anxious.     MEDICATIONS AT HOME:   Prior to Admission medications   Medication Sig Start Date End Date Taking? Authorizing Provider  albuterol (PROVENTIL) (2.5 MG/3ML) 0.083% nebulizer solution Take 2.5 mg by nebulization every 4 (four) hours as needed for wheezing or shortness of breath.  03/06/15  Yes [provider]  alprazolam Duanne Moron) 2 MG tablet Take 1 tablet (2 mg total) by mouth at bedtime as needed for sleep. 06/14/18  Yes Guse, Jacquelynn Cree, FNP  ARIPiprazole (ABILIFY) 10 MG tablet Take 0.5 tablets (5 mg total) by mouth daily. 06/14/18  Yes Guse, Jacquelynn Cree, FNP  aspirin 81 MG tablet Take 81 mg by mouth daily. Reported on 01/15/2016   Yes [provider]  budesonide-formoterol (SYMBICORT) 160-4.5 MCG/ACT inhaler Inhale 2 puffs into the lungs 2 (two) times daily. 05/31/18  Yes Guse, Jacquelynn Cree, FNP  celecoxib (CELEBREX) 200 MG capsule Take 1 capsule (200 mg total) by mouth daily after breakfast. 05/31/18  Yes Guse, Jacquelynn Cree, FNP  FLUoxetine (PROZAC) 20 MG capsule Take 60 mg by mouth daily. 06/14/18  Yes [provider]  folic acid (FOLVITE) 1 MG tablet Take 1 mg by mouth daily.   Yes [provider]  metoprolol succinate (TOPROL-XL) 25 MG 24 hr tablet Take 25 mg by mouth daily.  04/02/17  Yes [provider]  mirabegron ER (MYRBETRIQ) 50 MG TB24 tablet Take 1 tablet (50 mg total) by mouth daily. 11/10/17  Yes Hollice Espy, MD  Multiple Vitamins-Minerals (MULTIVITAMIN WITH MINERALS) tablet Take 1 tablet by  mouth daily.   Yes [provider]  omega-3 fish oil (MAXEPA) 1000 MG CAPS capsule Take 1 capsule by mouth daily.  05/08/18  Yes [provider]  pantoprazole (PROTONIX) 40 MG tablet Take 40 mg by mouth daily.   Yes [provider]  rosuvastatin (CRESTOR) 10 MG tablet Take 10 mg by mouth daily. 08/01/15  Yes [provider]  SPIRIVA RESPIMAT 2.5 MCG/ACT AERS Inhale 2 puffs into the lungs daily.  02/13/17  Yes [provider]  tamsulosin (FLOMAX) 0.4 MG CAPS capsule Take 1 capsule (0.4 mg total) by mouth daily after supper. 10/09/16  Yes Chrystal, Eulas Post, MD  traZODone (DESYREL) 150 MG tablet Take 1 tablet (150 mg total) by mouth at bedtime. 06/14/18  Yes Guse, Jacquelynn Cree, FNP  clopidogrel (PLAVIX) 75 MG tablet  Take 75 mg by mouth daily. Reported on 01/15/2016 10/31/14   [provider]  HYDROcodone-acetaminophen (NORCO/VICODIN) 5-325 MG tablet Take 1 tablet by mouth every 4 (four) hours as needed for moderate pain. 07/06/18   Jules Husbands, MD  Spacer/Aero-Holding Chambers DEVI 1 each by Does not apply route daily. 06/08/18   Tyler Pita, MD      VITAL SIGNS:  Blood pressure 110/64, pulse 76, temperature (!) 97 F (36.1 C), resp. rate 17, height 6' (1.829 m), weight 120.2 kg, SpO2 94 %.  PHYSICAL EXAMINATION:  Physical Exam  GENERAL:  72 y.o.-year-old patient lying in the bed with no acute distress.  EYES: Pupils equal, round, reactive to light and accommodation. No scleral icterus. Extraocular muscles intact.  HEENT: Head atraumatic, normocephalic. Oropharynx and nasopharynx clear.  NECK:  Supple, no jugular venous distention. No thyroid enlargement, no tenderness.  LUNGS: Normal breath sounds bilaterally, no wheezing, rales,rhonchi or crepitation. No use of accessory muscles of respiration.  CARDIOVASCULAR: S1, S2 normal. No murmurs, rubs, or gallops.  ABDOMEN: Soft, tenderness in middle part of abdomen, nondistended. Bowel sounds  present. No organomegaly or mass.  EXTREMITIES: No pedal edema, cyanosis, or clubbing.  NEUROLOGIC: Cranial nerves II through XII are intact. Muscle strength 5/5 in all extremities. Sensation intact. Gait not checked.  PSYCHIATRIC: The patient is alert and oriented x 3.  SKIN: No obvious rash, lesion, or ulcer.   LABORATORY PANEL:   CBC No results for input(s): WBC, HGB, HCT, PLT in the last 168 hours. ------------------------------------------------------------------------------------------------------------------  Chemistries  No results for input(s): NA, K, CL, CO2, GLUCOSE, BUN, CREATININE, CALCIUM, MG, AST, ALT, ALKPHOS, BILITOT in the last 168 hours.  Invalid input(s): GFRCGP ------------------------------------------------------------------------------------------------------------------  Cardiac Enzymes No results for input(s): TROPONINI in the last 168 hours. ------------------------------------------------------------------------------------------------------------------  RADIOLOGY:  Dg Chest Port 1 View  Result Date: 07/06/2018 CLINICAL DATA:  Hernia repair. COPD. Coronary artery disease. Prostate cancer. EXAM: PORTABLE CHEST 1 VIEW COMPARISON:  05/17/2018. FINDINGS: Mediastinum hilar structures are normal. Stable mild cardiomegaly. Left base atelectasis/infiltrate. No prominent pleural effusion. No pneumothorax. Right chest wall subcutaneous emphysema noted. Clinical correlation suggested. No acute bony abnormality identified. IMPRESSION: 1.  Left base atelectasis/infiltrate. 2.  Mild cardiomegaly.  No pulmonary venous congestion. 3. Right chest wall subcutaneous emphysema, clinical correlation suggested. Electronically Signed   By: Marcello Moores  Register   On: 07/06/2018 12:57      IMPRESSION AND PLAN:   Acute respiratory failure with hypoxia, possible due to anesthesia. The patient will be placed for observation. Continue oxygen by nasal cannula, try to wean off  oxygen. DuoNeb as needed, continue Spiriva and Dulera.  The patient is encouraged to use spirometry.  COPD.  Stable.  Treatment as above. CAD.  Hold aspirin and Plavix due to surgery, resume tomorrow. Hypertension.  Continue home Lopressor.  I discussed with Dr. Ola Spurr, anesthesiologist. All the records are reviewed and case discussed with ED provider. Management plans discussed with the patient, her daughter and they are in agreement.  CODE STATUS: Full code.  TOTAL TIME TAKING CARE OF THIS PATIENT: 35 minutes.    Demetrios Loll M.D on 07/06/2018 at 3:19 PM  Between 7am to 6pm - Pager - [phone]  After 6pm go to [email or web address] - Proofreader  [facility]  Office  [phone]  CC: Primary care physician; Jodelle Green, FNP   Note: This dictation was prepared with Dragon dictation along with smaller phrase technology. Any transcriptional errors that result from this  process are unin

## 2018-07-06 NOTE — Anesthesia Procedure Notes (Signed)
Procedure Name: Intubation Date/Time: 07/06/2018 7:41 AM Performed by: Durenda Hurt, MD Pre-anesthesia Checklist: Patient identified, Emergency Drugs available, Suction available, Patient being monitored and Timeout performed Patient Re-evaluated:Patient Re-evaluated prior to induction Oxygen Delivery Method: Circle system utilized Preoxygenation: Pre-oxygenation with 100% oxygen Induction Type: IV induction Ventilation: Mask ventilation without difficulty Laryngoscope Size: Mac and 4 Grade View: Grade II Tube type: Oral Tube size: 7.5 mm Number of attempts: 1 Airway Equipment and Method: Stylet Placement Confirmation: ETT inserted through vocal cords under direct vision,  positive ETCO2 and breath sounds checked- equal and bilateral Secured at: 22 cm Tube secured with: Tape Dental Injury: Teeth and Oropharynx as per pre-operative assessment

## 2018-07-06 NOTE — Progress Notes (Signed)
duoneb given for low sat on 87 on room air

## 2018-07-06 NOTE — Transfer of Care (Signed)
Immediate Anesthesia Transfer of Care Note  Patient: Christopher Orr  Procedure(s) Performed: ROBOT ASSISTED INGUINAL HERNIA REPAIR (Bilateral ) LAPAROSCOPIC ROBOT ASSISTED UMBILICAL HERNIA (N/A )  Patient Location: PACU  Anesthesia Type:General  Level of Consciousness: drowsy  Airway & Oxygen Therapy: Patient Spontanous Breathing and Patient connected to face mask oxygen  Post-op Assessment: Report given to RN  Post vital signs: stable  Last Vitals:  Vitals Value Taken Time  BP 119/57 07/06/2018 10:06 AM  Temp    Pulse 83 07/06/2018 10:09 AM  Resp 14 07/06/2018 10:09 AM  SpO2 94 % 07/06/2018 10:09 AM  Vitals shown include unvalidated device data.  Last Pain:  Vitals:   07/06/18 0610  TempSrc: Tympanic  PainSc: 3       Patients Stated Pain Goal: 1 (90/93/11 2162)  Complications: No apparent anesthesia complications

## 2018-07-06 NOTE — H&P (Signed)
Chief Complaint  Patient presents with  . Follow-up    ct scan done    HPI: 72 obese male with a history of coronary artery disease, COPD and cirrhosis.  Recently with recurrent and symptomatic left inguinal hernia.  I have ordered a CT scan of the abdomen and pelvis that I have personally reviewed showing evidence of bilateral inguinal hernias.  Also evidence of an umbilical hernia. Does have dyspnea on exertion.  He is on both aspirin and Plavix. HE continues to have left inguinal sharp and moderatepain that radiate to the left thigh BMP and INR is normal.  There is no evidence of ascites. Mild Cirrhosis cirrhosis only child's A.        Past Medical History:  Diagnosis Date  . Alcohol abuse   . Anemia   . Bipolar disorder ([place])   . BPH (benign prostatic hypertrophy) with urinary obstruction   . CAD (coronary artery disease)    a. s/p stent to LAD in 2006 at [facility], EF 60% at that time.  . Cancer [facility])    prostate  . CHF (congestive heart failure) ([place])   . Chronic pancreatitis ([place])   . COPD (chronic obstructive pulmonary disease) ([place])   . Depression   . Diverticulosis   . Drug use   . ED (erectile dysfunction)   . GERD (gastroesophageal reflux disease)   . Hepatitis C   . History of lumbar fusion   . Hyperlipidemia   . Hypertension   . Mitral regurgitation   . PTSD (post-traumatic stress disorder)   . Urge incontinence          Past Surgical History:  Procedure Laterality Date  . cateract  2013  . CORONARY STENT PLACEMENT  2006  . ESOPHAGOGASTRODUODENOSCOPY N/A 12/22/2014   Procedure: ESOPHAGOGASTRODUODENOSCOPY (EGD);  Surgeon: [name], MD;  Location[facility] ENDOSCOPY;  Service: Endoscopy;  Laterality: N/A;  . HERNIA REPAIR  2015  . LUMBAR FUSION    . RADIOACTIVE SEED IMPLANT N/A 04/22/2016   Procedure: RADIOACTIVE SEED IMPLANT/BRACHYTHERAPY IMPLANT;  Surgeon: Hollice Espy, MD;  Location: [facility] ORS;  Service:  Urology;  Laterality: N/A;  . TONSILLECTOMY           Family History  Problem Relation Age of Onset  . Heart disease Father        Unclear details. Father passed in late 93's 2/2 cancer.  . Colon cancer Father   . Alcohol abuse Sister   . Drug abuse Sister   . Anxiety disorder Sister   . Depression Sister   . Kidney disease Neg Hx   . Prostate cancer Neg Hx     Social History:  reports that he quit smoking about 5 years ago. His smoking use included cigarettes. He started smoking about 55 years ago. He smoked 1.50 packs per day. He has quit using smokeless tobacco. He reports that he drinks about 6.0 - 14.0 standard drinks of alcohol per week. He reports that he has current or past drug history. Drug: Marijuana.  Allergies: No Known Allergies  Medications reviewed.    ROS Full ROS performed and is otherwise negative other than what is stated in HPI   Physical Exam  Constitutional: He is oriented to person, place, and time. He appears well-developed and well-nourished.  Neck: Normal range of motion. Neck supple. No tracheal deviation present. No thyromegaly present.  Cardiovascular: Normal rate, regular rhythm and normal heart sounds.  Pulmonary/Chest: Effort normal and breath sounds normal. No stridor. No respiratory  distress. He has no wheezes. He has no rales.  Abdominal: Soft. He exhibits no distension and no mass. There is no tenderness. There is no guarding.  REducible large umbilical hernia.  Pain on the left inguinal area.  Due to the his body habitus exam is limited and I cannot definitively tell a hernia defect  Genitourinary: Penis normal.  Musculoskeletal: Normal range of motion.  Neurological: He is alert and oriented to person, place, and time. No cranial nerve deficit. He exhibits normal muscle tone. Coordination normal.  Skin: Skin is warm and dry. Capillary refill takes less than 2 seconds.  Psychiatric: He has a normal mood and affect.  His behavior is normal. Judgment and thought content normal.  Nursing note and vitals reviewed.    Assessment/Plan: 72 year old male with symptomatic left inguinal hernia that is recurrent and asymptomatic right inguinal hernia as well as an umbilical hernia.  He has multiple comorbidities including COPD and coronary artery disease.  Discussed with the patient in detail given his symptoms I do recommend surgical intervention.  I do think that he is a very good candidate for robotic surgery.  Procedure discussed with the patient in detail.  Risk benefit and possible complications including but not limited to: Bleeding, recurrence, pain, bowel injury, persistence of symptoms.  He understands and wishes to proceed. He has stopped plavix   Caroleen Hamman, MD FACS

## 2018-07-06 NOTE — Op Note (Signed)
Robotic assisted Laparoscopic Bilateral Inguinal Hernia Repair and Umbilical hernia repair  ESHAAN TITZER  07/06/2018  Pre-operative Diagnosis: Bilateral Inguinal Hernia, Umbilical hernia  Post-operative Diagnosis: Same  Procedure: Robotic assisted Laparoscopic Transabdominal repair of bilateral inguinal hernias ( left recurrent ) with Large 3 D Mesh. Umbilical Hernia repair using small ventralex patch ( [place])  Surgeon: Caroleen Hamman, MD FACS  Anesthesia: Gen. with endotracheal tube  Findings:  Recurrent left inguinal hernia with mesh loaded into the left epigastric vessels and I was unable to remove it due to my concerns of creating more damage.  The mesh was wrinkled and there was a recurrence lateral side Right indirect inguinal hernia 2.5 cm umbilical hernia  Procedure Details  The patient was seen again in the Holding Room. The benefits, complications, treatment options, and expected outcomes were discussed with the patient. The risks of bleeding, infection, recurrence of symptoms, failure to resolve symptoms, recurrence of hernia, ischemic orchitis, chronic pain syndrome or neuroma, were discussed again. The likelihood of improving the patient's symptoms with return to their baseline status is good.  The patient and/or family concurred with the proposed plan, giving informed consent.  The patient was taken to Operating Room, identified as SAHEED CARRINGTON and the procedure verified as Laparoscopic Inguinal Hernia Repair. Laterality confirmed.  A Time Out was held and the above information confirmed.  Prior to the induction of general anesthesia, antibiotic prophylaxis was administered. VTE prophylaxis was in place. General endotracheal anesthesia was then administered and tolerated well. After the induction, the abdomen was prepped with Chloraprep and draped in the sterile fashion. The patient was positioned in the supine position.  Billick incision was created and an hernia was  encountered.  The sac was dissected circumferentially and the sac was excised and sent for pathology.  We clean the edges of the fascia with electrocautery.  2 stay sutures were placed on each side and a Hassan trocar inserted.  Pneumoperitoneum was obtained.  Diagnostic laparoscopy revealed evidence of bilateral inguinal hernias.  I placed 2 8 mm robotic ports under direct visualization.  The robot was brought to the surgical field and docked in the standard fashion.  We made sure there was no collisions between arms and the instruments were visualized at all times.  I scrubbed out and went to the console. Started with the right side I created peritoneal flap and was able to do you reduce the sac entirely.  We identified the epigastric vessels femoral vessels and delineated Cooper ligament.  Once we had also an adequate reduction of the lipoma we were able to see that this was an indirect defect.  Further medial dissection to the pubic tubercle was carried.  Using a large 3D mesh we cover the indirect, direct and femoral spaces in the standard fashion and attach it to the pubic tubercle medially with 2 interrupted Vicryl sutures.  The peritoneal flap was closed with a running 2 OV lock.  Attention then was turned to the left side peritoneal flap was created and we quickly realized that this was a distorted anatomy.  There was significant inflammatory response and we perform meticulous dissection.  Once again were able to identify the vas deferens and also Cooper ligaments.  We identified the epigastric vessels and visualize a piece of mesh that is wrinkled.  The mesh was eroding into the left epigastric vessels and because of this I did not want to remove it.  I was able to reduce the hernia sac completely and  also was able to reduce the lipoma of the cord.  Tubercle was identified medially.  Once we had an adequate dissection a large 3D mesh was placed and suture to the pubic tubercle with 2 interrupted 2-0  Vicryl sutures.  The peritoneal flap was closed with a running 2V lock.  The robot was undocked and all the instruments were removed as well as all the needles.  I scrubbed back in again and removed all the ports and the pneumoperitoneum was deflated.  There was no evidence of any bowel injury or any other injuries.  I decided to insert a small ventral light patch since this was a significant defect this was placed in the standard fashion and attached to the fascia with multiple interrupted 0 Ethibond stitches.  The subcu was closed with interrupted 3-0 Vicryls and the skin was closed with 4-0 Monocryl in a subcuticular fashion.  Dermabond was used to coat all incisions.  I placed liposomal Marcaine at all incision sites for postoperative analgesia There were no immediate complications   Patient tolerated the procedure well. There were no complications. He was taken to the recovery room in stable condition.               Caroleen Hamman, MD, FACS

## 2018-07-06 NOTE — Discharge Instructions (Addendum)
Follow-up with primary care physician in 3 days Follow-up with surgery Dr. Perrin Maltese on December 23 at 9:15 AM

## 2018-07-06 NOTE — OR Nursing (Signed)
Slight skin rash under abdominal folds.  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 Needs clarification. O2 sats remain in the low 89-90% while in preop.

## 2018-07-06 NOTE — Anesthesia Preprocedure Evaluation (Addendum)
Anesthesia Evaluation  Patient identified by MRN, date of birth, ID band Patient awake    Reviewed: Allergy & Precautions, H&P , NPO status , Patient's Chart, lab work & pertinent test results  Airway Mallampati: III       Dental  (+) Upper Dentures   Pulmonary shortness of breath, COPD (pre-op SpO2 88-91% RA, pt states he does not wear home O2, symptoms are at baseline and no recent exacerbations),  COPD inhaler, Current Smoker,    breath sounds clear to auscultation       Cardiovascular hypertension, (-) angina+ CAD, + Cardiac Stents (2006) and +CHF    Echo 2016: normal   Neuro/Psych  Headaches, PSYCHIATRIC DISORDERS Anxiety Depression Bipolar Disorder    GI/Hepatic GERD  ,(+) Hepatitis -, C  Endo/Other  negative endocrine ROS  Renal/GU      Musculoskeletal   Abdominal   Peds  Hematology  (+) Blood dyscrasia, anemia ,   Anesthesia Other Findings Past Medical History: No date: Alcohol abuse No date: Anemia No date: Arthritis No date: Bipolar disorder (HCC) No date: BPH (benign prostatic hypertrophy) with urinary obstruction No date: CAD (coronary artery disease)     Comment:  a. s/p stent to LAD in 2006 at [facility], EF 60% at that               time. No date: Cancer [facility])     Comment:  prostate No date: CHF (congestive heart failure) (HCC) No date: Chronic pancreatitis (HCC) No date: COPD (chronic obstructive pulmonary disease) (HCC) No date: Depression No date: Diverticulosis No date: Drug use No date: Dyspnea No date: ED (erectile dysfunction) No date: GERD (gastroesophageal reflux disease) No date: Headache     Comment:  occasional migraines No date: Hepatitis C     Comment:  treated No date: History of lumbar fusion No date: Hyperlipidemia No date: Hypertension     Comment:  patient denies having high blood pressure No date: Mitral regurgitation No date: PTSD (post-traumatic stress disorder) No  date: Urge incontinence  Past Surgical History: 2013: cateract 2006: CORONARY STENT PLACEMENT     Comment:  x 1 12/22/2014: ESOPHAGOGASTRODUODENOSCOPY; N/A     Comment:  Procedure: ESOPHAGOGASTRODUODENOSCOPY (EGD);  Surgeon:               [name], MD;  Location: [facility] ENDOSCOPY;                Service: Endoscopy;  Laterality: N/A; 2015: HERNIA REPAIR     Comment:  left groin No date: LUMBAR FUSION 04/22/2016: RADIOACTIVE SEED IMPLANT; N/A     Comment:  Procedure: RADIOACTIVE SEED IMPLANT/BRACHYTHERAPY               IMPLANT;  Surgeon: Hollice Espy, MD;  Location: [facility]               ORS;  Service: Urology;  Laterality: N/A; No date: TONSILLECTOMY  BMI    Body Mass Index:  35.94 kg/m      Reproductive/Obstetrics negative OB ROS                          Anesthesia Physical Anesthesia Plan  ASA: III  Anesthesia Plan: General ETT   Post-op Pain Management:    Induction:   PONV Risk Score and Plan: Ondansetron, Dexamethasone, Midazolam and Treatment may vary due to age or medical condition  Airway Management Planned:   Additional Equipment:   Intra-op Plan:  Post-operative Plan:   Informed Consent: I have reviewed the patients History and Physical, chart, labs and discussed the procedure including the risks, benefits and alternatives for the proposed anesthesia with the patient or authorized representative who has indicated his/her understanding and acceptance.   Dental Advisory Given  Plan Discussed with: Anesthesiologist, CRNA and Surgeon  Anesthesia Plan Comments:        Anesthesia Quick Evaluation

## 2018-07-06 NOTE — Anesthesia Post-op Follow-up Note (Signed)
Anesthesia QCDR form completed.        

## 2018-07-07 ENCOUNTER — Encounter: Payer: Self-pay | Admitting: Surgery

## 2018-07-07 DIAGNOSIS — K4021 Bilateral inguinal hernia, without obstruction or gangrene, recurrent: Secondary | ICD-10-CM | POA: Diagnosis not present

## 2018-07-07 LAB — CBC
HEMATOCRIT: 34.1 % — AB (ref 39.0–52.0)
Hemoglobin: 11.3 g/dL — ABNORMAL LOW (ref 13.0–17.0)
MCH: 32.5 pg (ref 26.0–34.0)
MCHC: 33.1 g/dL (ref 30.0–36.0)
MCV: 98 fL (ref 80.0–100.0)
Platelets: 235 10*3/uL (ref 150–400)
RBC: 3.48 MIL/uL — ABNORMAL LOW (ref 4.22–5.81)
RDW: 14.2 % (ref 11.5–15.5)
WBC: 11.9 10*3/uL — ABNORMAL HIGH (ref 4.0–10.5)
nRBC: 0 % (ref 0.0–0.2)

## 2018-07-07 LAB — BASIC METABOLIC PANEL
Anion gap: 7 (ref 5–15)
BUN: 17 mg/dL (ref 8–23)
CO2: 23 mmol/L (ref 22–32)
Calcium: 8.6 mg/dL — ABNORMAL LOW (ref 8.9–10.3)
Chloride: 107 mmol/L (ref 98–111)
Creatinine, Ser: 1.06 mg/dL (ref 0.61–1.24)
GFR calc Af Amer: >60 mL/min (ref >60)
Glucose, Bld: 117 mg/dL — ABNORMAL HIGH (ref 70–99)
Potassium: 4.1 mmol/L (ref 3.5–5.1)
Sodium: 137 mmol/L (ref 135–145)

## 2018-07-07 LAB — SURGICAL PATHOLOGY

## 2018-07-07 MED ORDER — ALUM & MAG HYDROXIDE-SIMETH 200-200-20 MG/5ML PO SUSP: 30.0000 mL | Freq: Four times a day (QID) | ORAL | Status: DC | PRN

## 2018-07-07 MED ORDER — ALUM & MAG HYDROXIDE-SIMETH 200-200-20 MG/5ML PO SUSP: 30.0000 mL | Freq: Four times a day (QID) | ORAL | 0 refills | Status: DC | PRN

## 2018-07-07 MED ORDER — SENNOSIDES-DOCUSATE SODIUM 8.6-50 MG PO TABS: 1.0000 | ORAL_TABLET | Freq: Every day | ORAL | 0 refills | Status: DC

## 2018-07-07 MED ORDER — ACETAMINOPHEN 325 MG PO TABS: 650.0000 mg | ORAL_TABLET | Freq: Four times a day (QID) | ORAL | Status: DC | PRN

## 2018-07-07 MED ORDER — HYDROCODONE-ACETAMINOPHEN 5-325 MG PO TABS: 1.0000 | ORAL_TABLET | ORAL | 0 refills | Status: DC | PRN

## 2018-07-07 NOTE — Care Management Obs Status (Signed)
[place] NOTIFICATION   Patient Details  Name: Christopher Orr MRN: [id number] Date of Birth: [birth year]   Medicare Observation Status Notification Given:  No(admitted obs less than 24 hours)    Beverly Sessions, RN 07/07/2018, 11:16 AM

## 2018-07-07 NOTE — Care Management (Addendum)
Initially patient qualified for continuous home O2. However patient declines for portable O2 to be set up.  Patient states that he has a concentrator in the home for nocturnal O2, however he is unsure of which company it is with. RNCM checked with [facility] and his O2 did not come from them.  RN rechecked sats and patient was able to maintain sats of 92% on RA.  MD notified that patient declined

## 2018-07-07 NOTE — Progress Notes (Signed)
SATURATION QUALIFICATIONS: (This note is used to comply with regulatory documentation for home oxygen)  Patient Saturations on Room Air at Rest = 87%  Patient Saturations on 3 Liters of oxygen at Rest = 91%  Please briefly explain why patient needs home oxygen:  Patient desats when attempting to wean to room air.

## 2018-07-07 NOTE — Progress Notes (Signed)
Pt seen and examined. Main pulm issues. No surgical complications. + PO Abd soft, incisions c/d/i  A/p Doing well from surgical perspective dispo per hospitalist

## 2018-07-07 NOTE — Anesthesia Postprocedure Evaluation (Signed)
Anesthesia Post Note  Patient: Christopher Orr  Procedure(s) Performed: ROBOT ASSISTED INGUINAL HERNIA REPAIR (Bilateral ) LAPAROSCOPIC ROBOT ASSISTED UMBILICAL HERNIA (N/A )  Patient location during evaluation: PACU Anesthesia Type: General Level of consciousness: awake and alert Pain management: pain level controlled Vital Signs Assessment: post-procedure vital signs reviewed and stable Respiratory status: spontaneous breathing and patient connected to nasal cannula oxygen Cardiovascular status: blood pressure returned to baseline and stable Postop Assessment: no apparent nausea or vomiting Anesthesia complication: Pt unable to maintain SpO2 >90% post op, most likely atelectasis, tried trial of BiPap, CXR no acute findings, ultimately pt was admitted to hospitalist service for supplemental O2  requirement.     Last Vitals:  Vitals:   07/06/18 1923 07/07/18 0620  BP: 100/72 112/67  Pulse: 76 61  Resp: 20 20  Temp: 36.7 C 36.8 C  SpO2: 93% 94%    Last Pain:  Vitals:   07/07/18 0640  TempSrc:   PainSc: [place]

## 2018-07-07 NOTE — Progress Notes (Signed)
   07/07/18 1120  Oxygen Therapy  SpO2 92 %  O2 Device Room [employer]

## 2018-07-07 NOTE — Discharge Summary (Addendum)
[name] [facility] [place] [facility] NAME: Christopher Orr    MR#:  [id number]  DATE OF BIRTH:  03/05/1946  DATE OF ADMISSION:  07/06/2018 ADMITTING PHYSICIAN: Jules Husbands, MD  DATE OF DISCHARGE:  07/07/18   PRIMARY CARE PHYSICIAN: Jodelle Green, FNP    ADMISSION DIAGNOSIS:  BILATERAL INGUINAL AND UMBILICAL  DISCHARGE DIAGNOSIS:  Active Problems:   Bilateral inguinal hernia, without obstruction or gangrene, recurrent   Umbilical hernia without obstruction and without gangrene   Acute respiratory failure with hypoxia ([place])   SECONDARY DIAGNOSIS:   Past Medical History:  Diagnosis Date  . Alcohol abuse   . Anemia   . Arthritis   . Bipolar disorder ([place])   . BPH (benign prostatic hypertrophy) with urinary obstruction   . CAD (coronary artery disease)    a. s/p stent to LAD in 2006 at [facility], EF 60% at that time.  . Cancer [facility])    prostate  . CHF (congestive heart failure) ([place])   . Chronic pancreatitis ([place])   . COPD (chronic obstructive pulmonary disease) ([place])   . Depression   . Diverticulosis   . Drug use   . Dyspnea   . ED (erectile dysfunction)   . GERD (gastroesophageal reflux disease)   . Headache    occasional migraines  . Hepatitis C    treated  . History of lumbar fusion   . Hyperlipidemia   . Hypertension    patient denies having high blood pressure  . Mitral regurgitation   . PTSD (post-traumatic stress disorder)   . Urge incontinence     HOSPITAL COURSE:   HPI  Christopher Orr  is a 72 y.o. male with a known history of alcohol abuse, anemia, arthritis, BPH, CAD, prostate cancer, CHF, chronic pancreatitis, COPD, diverticulosis, GERD, hypertension, hyperlipidemia etc.  The patient underwent robotic assisted laparoscopic bilateral inguinal hernia repair and umbilical hernia repair today.  He was intubated during operation and is extubated thereafter.  He is found hypoxia after extubation, put on BiPAP for about a  couple hours and change to oxygen by nasal cannula.  But the patient is still hypoxia at 70s to 80s with out oxygen.  General surgeon Dr. Dahlia Byes requested hospitalist consult for admission.  The patient denies any chest pain, palpitation, shortness breath, wheezing or cough.  He only complains of mild abdominal pain.  He is on oxygen 2 L now.  Acute respiratory failure with hypoxia, possible due to anesthesia and pain Clinically doing fine.   Incentive spirometry for atelectasis White blood cell count is trending down and patient is afebrile not considering antibiotics Continue oxygen 2 to 3 L by nasal cannula nightly which is chronic for him.  Patient is on room air during daytime DuoNeb as needed, continue Spiriva and Dulera.   Bilateral inguinal hernia and umbilical hernia repair Pain management as needed and okay to discharge patient from surgery standpoint Outpatient follow-up with Dr. Dahlia Byes in a week   COPD.  Stable.  Treatment as above.  CAD.  Held aspirin and Plavix for surgery, resumed today  Hypertension.  Continue home Lopressor.  DISCHARGE CONDITIONS:   fair  CONSULTS OBTAINED:     PROCEDURES repair of the bilateral inguinal hernia  DRUG ALLERGIES:  No Known Allergies  DISCHARGE MEDICATIONS:   Allergies as of 07/07/2018   No Known Allergies     Medication List    TAKE these medications   acetaminophen 325 MG tablet Commonly known  as:  TYLENOL Take 2 tablets (650 mg total) by mouth every 6 (six) hours as needed for mild pain (or Fever >/= 101).   albuterol (2.5 MG/3ML) 0.083% nebulizer solution Commonly known as:  PROVENTIL Take 2.5 mg by nebulization every 4 (four) hours as needed for wheezing or shortness of breath.   alprazolam 2 MG tablet Commonly known as:  XANAX Take 1 tablet (2 mg total) by mouth at bedtime as needed for sleep.   alum & mag hydroxide-simeth 200-200-20 MG/5ML suspension Commonly known as:  MAALOX/MYLANTA Take 30 mLs by mouth  every 6 (six) hours as needed for indigestion, heartburn or flatulence.   ARIPiprazole 10 MG tablet Commonly known as:  ABILIFY Take 0.5 tablets (5 mg total) by mouth daily.   aspirin 81 MG tablet Take 81 mg by mouth daily. Reported on 01/15/2016   budesonide-formoterol 160-4.5 MCG/ACT inhaler Commonly known as:  SYMBICORT Inhale 2 puffs into the lungs 2 (two) times daily.   celecoxib 200 MG capsule Commonly known as:  CELEBREX Take 1 capsule (200 mg total) by mouth daily after breakfast.   clopidogrel 75 MG tablet Commonly known as:  PLAVIX Take 75 mg by mouth daily. Reported on 01/15/2016   FLUoxetine 20 MG capsule Commonly known as:  PROZAC Take 60 mg by mouth daily.   folic acid 1 MG tablet Commonly known as:  FOLVITE Take 1 mg by mouth daily.   HYDROcodone-acetaminophen 5-325 MG tablet Commonly known as:  NORCO/VICODIN Take 1 tablet by mouth every 4 (four) hours as needed for moderate pain.   metoprolol succinate 25 MG 24 hr tablet Commonly known as:  TOPROL-XL Take 25 mg by mouth daily.   mirabegron ER 50 MG Tb24 tablet Commonly known as:  MYRBETRIQ Take 1 tablet (50 mg total) by mouth daily.   multivitamin with minerals tablet Take 1 tablet by mouth daily.   omega-3 fish oil 1000 MG Caps capsule Commonly known as:  MAXEPA Take 1 capsule by mouth daily.   pantoprazole 40 MG tablet Commonly known as:  PROTONIX Take 40 mg by mouth daily.   rosuvastatin 10 MG tablet Commonly known as:  CRESTOR Take 10 mg by mouth daily.   senna-docusate 8.6-50 MG tablet Commonly known as:  Senokot-S Take 1 tablet by mouth at bedtime.   Spacer/Aero-Holding Dorise Bullion 1 each by Does not apply route daily.   SPIRIVA RESPIMAT 2.5 MCG/ACT Aers Generic drug:  Tiotropium Bromide Monohydrate Inhale 2 puffs into the lungs daily.   tamsulosin 0.4 MG Caps capsule Commonly known as:  FLOMAX Take 1 capsule (0.4 mg total) by mouth daily after supper.   traZODone 150 MG  tablet Commonly known as:  DESYREL Take 1 tablet (150 mg total) by mouth at bedtime.            Durable Medical Equipment  (From admission, onward)         Start     Ordered   07/07/18 1034  For home use only DME oxygen  Once    Question Answer Comment  Mode or (Route) Nasal cannula   Liters per Minute 3   Frequency Continuous (stationary and portable oxygen unit needed)   Oxygen conserving device Yes   Oxygen delivery system Gas      07/07/18 1034           DISCHARGE INSTRUCTIONS:   Follow-up with primary care physician in 3 days Follow-up with surgery Dr. Perrin Maltese on December 23 at 9:15 AM  DIET:  Cardiac diet  DISCHARGE CONDITION:  Stable  ACTIVITY:  Activity as tolerated  OXYGEN:  Home Oxygen: Yes.     Oxygen Delivery: 3 liters/min via Patient connected to nasal cannula oxygen qhs   DISCHARGE LOCATION:  home   If you experience worsening of your admission symptoms, develop shortness of breath, life threatening emergency, suicidal or homicidal thoughts you must seek medical attention immediately by calling 911 or calling your MD immediately  if symptoms less severe.  You Must read complete instructions/literature along with all the possible adverse reactions/side effects for all the Medicines you take and that have been prescribed to you. Take any new Medicines after you have completely understood and accpet all the possible adverse reactions/side effects.   Please note  You were cared for by a hospitalist during your hospital stay. If you have any questions about your discharge medications or the care you received while you were in the hospital after you are discharged, you can call the unit and asked to speak with the hospitalist on call if the hospitalist that took care of you is not available. Once you are discharged, your primary care physician will handle any further medical issues. Please note that NO REFILLS for any discharge medications will be  authorized once you are discharged, as it is imperative that you return to your primary care physician (or establish a relationship with a primary care physician if you do not have one) for your aftercare needs so that they can reassess your need for medications and monitor your lab values.     Today  No chief complaint on file.  Patient is doing fine with current pain management.  Reporting abdominal bloating and requesting Mylanta otherwise no complaints tolerating diet and okay to discharge patient from surgical standpoint.  Patient wants to go home we will discharge him  ROS:  CONSTITUTIONAL: Denies fevers, chills. Denies any fatigue, weakness.  EYES: Denies blurry vision, double vision, eye pain. EARS, NOSE, THROAT: Denies tinnitus, ear pain, hearing loss. RESPIRATORY: Denies cough, wheeze, shortness of breath.  CARDIOVASCULAR: Denies chest pain, palpitations, edema.  GASTROINTESTINAL: Denies nausea, vomiting, diarrhea, abdominal pain. Denies bright red blood per rectum.  Reporting abdominal bloating GENITOURINARY: Denies dysuria, hematuria. ENDOCRINE: Denies nocturia or thyroid problems. HEMATOLOGIC AND LYMPHATIC: Denies easy bruising or bleeding. SKIN: Denies rash or lesion. MUSCULOSKELETAL: Denies pain in neck, back, shoulder, knees, hips or arthritic symptoms.  NEUROLOGIC: Denies paralysis, paresthesias.  PSYCHIATRIC: Denies anxiety or depressive symptoms.   VITAL SIGNS:  Blood pressure 112/67, pulse 61, temperature 98.2 F (36.8 C), temperature source Oral, resp. rate 20, height 6' (1.829 m), weight 120.2 kg, SpO2 92 %.  I/O:    Intake/Output Summary (Last 24 hours) at 07/07/2018 1049 Last data filed at 07/07/2018 1037 Gross per 24 hour  Intake 1423 ml  Output 1800 ml  Net -377 ml    PHYSICAL EXAMINATION:  GENERAL:  71 y.o.-year-old patient lying in the bed with no acute distress.  EYES: Pupils equal, round, reactive to light and accommodation. No scleral icterus.  Extraocular muscles intact.  HEENT: Head atraumatic, normocephalic. Oropharynx and nasopharynx clear.  NECK:  Supple, no jugular venous distention. No thyroid enlargement, no tenderness.  LUNGS: Normal breath sounds bilaterally, no wheezing, rales,rhonchi or crepitation. No use of accessory muscles of respiration.  CARDIOVASCULAR: S1, S2 normal. No murmurs, rubs, or gallops.  ABDOMEN: Soft, non-tender, some distention is present  Bowel sounds present.  Surgical incision slightly tender EXTREMITIES: No pedal edema, cyanosis, or clubbing.  NEUROLOGIC: Awake, alert and oriented x3 sensation intact. Gait not checked.  PSYCHIATRIC: The patient is alert and oriented x 3.  SKIN: No obvious rash, lesion, or ulcer.   DATA REVIEW:   CBC Recent Labs  Lab 07/07/18 0435  WBC 11.9*  HGB 11.3*  HCT 34.1*  PLT 235    Chemistries  Recent Labs  Lab 07/06/18 1514 07/07/18 0435  NA 138 137  K 4.8 4.1  CL 106 107  CO2 26 23  GLUCOSE 159* 117*  BUN 20 17  CREATININE 1.19 1.06  CALCIUM 8.9 8.6*  MG 2.0  --     Cardiac Enzymes No results for input(s): TROPONINI in the last 168 hours.  Microbiology Results  Results for orders placed or performed in visit on 08/08/16  CULTURE, URINE COMPREHENSIVE     Status: None   Collection Time: 08/08/16 11:51 AM  Result Value Ref Range Status   Urine Culture, Comprehensive Final report  Final   Organism ID, Bacteria Comment  Final    Comment: No growth in 48 hours.  Microscopic Examination     Status: Abnormal   Collection Time: 08/08/16 11:51 AM  Result Value Ref Range Status   WBC, UA 0-5 0 - 5 /hpf Final   RBC, UA 3-10 (A) 0 - 2 /hpf Final   Epithelial Cells (non renal) 0-10 0 - 10 /hpf Final   Mucus, UA Present (A) Not Estab. Final   Bacteria, UA Few None seen/Few Final    RADIOLOGY:  Dg Chest Port 1 View  Result Date: 07/06/2018 CLINICAL DATA:  Hernia repair. COPD. Coronary artery disease. Prostate cancer. EXAM: PORTABLE CHEST 1 VIEW  COMPARISON:  05/17/2018. FINDINGS: Mediastinum hilar structures are normal. Stable mild cardiomegaly. Left base atelectasis/infiltrate. No prominent pleural effusion. No pneumothorax. Right chest wall subcutaneous emphysema noted. Clinical correlation suggested. No acute bony abnormality identified. IMPRESSION: 1.  Left base atelectasis/infiltrate. 2.  Mild cardiomegaly.  No pulmonary venous congestion. 3. Right chest wall subcutaneous emphysema, clinical correlation suggested. Electronically Signed   By: Marcello Moores  Register   On: 07/06/2018 12:57    EKG:   Orders placed or performed during the hospital encounter of 05/17/18  . EKG 12-Lead  . EKG 12-Lead  . ED EKG within 10 minutes  . ED EKG within 10 minutes  . EKG      Management plans discussed with the patient, family and they are in agreement.  CODE STATUS:     Code Status Orders  (From admission, onward)         Start     Ordered   07/06/18 1614  Full code  Continuous     07/06/18 1613        Code Status History    Date Active Date Inactive Code Status Order ID Comments User Context   07/27/2014 0522 07/30/2014 1414 Full Code [id number]  Germain Osgood, PA-C ED      TOTAL TIME TAKING CARE OF THIS PATIENT: 42  minutes.   Note: This dictation was prepared with Dragon dictation along with smaller phrase technology. Any transcriptional errors that result from this process are unintentional.   @[facility] @  on 07/07/2018 at 10:49 AM  Between 7am to 6pm - Pager - [phone]  After 6pm go to [email or web address] - password [email or web address] [facility]  Office  [phone]  CC: Primary care physician; Jodelle Green, FNP

## 2018-07-08 ENCOUNTER — Telehealth: Payer: Self-pay | Admitting: Family Medicine

## 2018-07-08 ENCOUNTER — Ambulatory Visit (INDEPENDENT_AMBULATORY_CARE_PROVIDER_SITE_OTHER): Payer: Medicare HMO | Admitting: Family Medicine

## 2018-07-08 ENCOUNTER — Encounter: Payer: Self-pay | Admitting: Family Medicine

## 2018-07-08 VITALS — BP 118/78 | HR 73 | Temp 98.0°F | Ht 72.0 in | Wt 266.4 lb

## 2018-07-08 DIAGNOSIS — J449 Chronic obstructive pulmonary disease, unspecified: Secondary | ICD-10-CM

## 2018-07-08 DIAGNOSIS — K4021 Bilateral inguinal hernia, without obstruction or gangrene, recurrent: Secondary | ICD-10-CM | POA: Diagnosis not present

## 2018-07-08 DIAGNOSIS — K5900 Constipation, unspecified: Secondary | ICD-10-CM

## 2018-07-08 DIAGNOSIS — J9601 Acute respiratory failure with hypoxia: Secondary | ICD-10-CM

## 2018-07-08 DIAGNOSIS — K429 Umbilical hernia without obstruction or gangrene: Secondary | ICD-10-CM | POA: Diagnosis not present

## 2018-07-08 LAB — COMPREHENSIVE METABOLIC PANEL
ALT: 12 U/L (ref 0–53)
AST: 18 U/L (ref 0–37)
Albumin: 4 g/dL (ref 3.5–5.2)
Alkaline Phosphatase: 76 U/L (ref 39–117)
BUN: 18 mg/dL (ref 6–23)
CALCIUM: 9.3 mg/dL (ref 8.4–10.5)
CO2: 26 meq/L (ref 19–32)
Chloride: 104 mEq/L (ref 96–112)
Creatinine, Ser: 1.2 mg/dL (ref 0.40–1.50)
GFR: 63.27 mL/min (ref >60.00)
Glucose, Bld: 96 mg/dL (ref 70–99)
Potassium: 4.6 mEq/L (ref 3.5–5.1)
Sodium: 136 mEq/L (ref 135–145)
Total Bilirubin: 0.4 mg/dL (ref 0.2–1.2)
Total Protein: 7 g/dL (ref 6.0–8.3)

## 2018-07-08 LAB — CBC
HCT: 36.9 % — ABNORMAL LOW (ref 39.0–52.0)
Hemoglobin: 12.4 g/dL — ABNORMAL LOW (ref 13.0–17.0)
MCHC: 33.5 g/dL (ref 30.0–36.0)
MCV: 97.5 fl (ref 78.0–100.0)
PLATELETS: 228 10*3/uL (ref 150.0–400.0)
RBC: 3.79 Mil/uL — ABNORMAL LOW (ref 4.22–5.81)
RDW: 14.4 % (ref 11.5–15.5)
WBC: 10.9 10*3/uL — ABNORMAL HIGH (ref 4.0–10.5)

## 2018-07-08 MED ORDER — BISACODYL 5 MG PO TBEC: 10.0000 mg | DELAYED_RELEASE_TABLET | Freq: Every day | ORAL | 1 refills | Status: DC | PRN

## 2018-07-08 MED ORDER — MAGNESIUM CITRATE PO SOLN: 1.0000 | Freq: Once | ORAL | 0 refills | Status: AC

## 2018-07-08 NOTE — Patient Instructions (Signed)
Take Senokot-S as prescribed and can also take miralax to help relieve constipation  Take dulcolax today and if no bowel movement by tomorrow take magnesium citrate

## 2018-07-08 NOTE — Progress Notes (Signed)
Subjective:    Patient ID: Christopher Orr, male    DOB: 05/31/1946, 72 y.o.   MRN: [id number]  HPI  Presents to clinic for hospital follow up. Was admitted 12/10 and discharged on 07/07/18 (yesterday) by the hospitalist service after hypoxia issues following hernia surgery.  Christopher Salen LPN attempted to make TCM phone call this AM prior to patient coming in before appt, but was unable to reach patient.   Discharge summary reviewed and copied from inpatient chart: DATE OF ADMISSION:  07/06/2018  ADMITTING PHYSICIAN: Christopher Husbands, MD  DATE OF DISCHARGE:  07/07/18   PRIMARY CARE PHYSICIAN: Christopher Green, FNP    ADMISSION DIAGNOSIS:  BILATERAL INGUINAL AND UMBILICAL  DISCHARGE DIAGNOSIS:  Active Problems:   Bilateral inguinal hernia, without obstruction or gangrene, recurrent   Umbilical hernia without obstruction and without gangrene   Acute respiratory failure with hypoxia ([place])   SECONDARY DIAGNOSIS:       Past Medical History:  Diagnosis Date  . Alcohol abuse   . Anemia   . Arthritis   . Bipolar disorder ([place])   . BPH (benign prostatic hypertrophy) with urinary obstruction   . CAD (coronary artery disease)    a. s/p stent to LAD in 2006 at [facility], EF 60% at that time.  . Cancer [facility])    prostate  . CHF (congestive heart failure) ([place])   . Chronic pancreatitis ([place])   . COPD (chronic obstructive pulmonary disease) ([place])   . Depression   . Diverticulosis   . Drug use   . Dyspnea   . ED (erectile dysfunction)   . GERD (gastroesophageal reflux disease)   . Headache    occasional migraines  . Hepatitis C    treated  . History of lumbar fusion   . Hyperlipidemia   . Hypertension    patient denies having high blood pressure  . Mitral regurgitation   . PTSD (post-traumatic stress disorder)   . Urge incontinence     HOSPITAL COURSE:   HPI  CharlesBaizeis a53 y.o.malewith a known history of alcohol abuse, anemia,  arthritis, BPH, CAD, prostate cancer, CHF, chronic pancreatitis, COPD, diverticulosis, GERD, hypertension, hyperlipidemia etc. The patient underwent robotic assisted laparoscopic bilateral inguinal hernia repair and umbilical hernia repair today. He was intubated during operation and is extubated thereafter. He is found hypoxia after extubation,put on BiPAP for about a couple hours and change to oxygen by nasal cannula. But the patient is still hypoxia at 70s to 80s with out oxygen. General surgeon Dr. Charlynne Orr consult for admission. The patient denies any chest pain, palpitation, shortness breath, wheezing or cough. He only complains of mild abdominal pain. He is on oxygen 2 L now.  Acute respiratory failure with hypoxia, possible due to anesthesia and pain Clinically doing fine.   Incentive spirometry for atelectasis White blood cell count is trending down and patient is afebrile not considering antibiotics Continue oxygen 2 to 3 L by nasal cannula nightly which is chronic for him.  Patient is on room air during daytime DuoNeb as needed, continue Spiriva and Dulera.   Bilateral inguinal hernia and umbilical hernia repair Pain management as needed and okay to discharge patient from surgery standpoint Outpatient follow-up with Dr. Dahlia Orr in a week   COPD. Stable. Treatment as above.  CAD. Held aspirin and Plavix for surgery, resumed today  Hypertension. Continue home Lopressor.  DISCHARGE CONDITIONS:   fair  Hospital labs reviewed: CBC Latest Ref Rng & Units 07/07/2018 07/06/2018 05/31/2018  WBC 4.0 - 10.5 K/uL 11.9(H) 15.2(H) 8.2  Hemoglobin 13.0 - 17.0 g/dL 11.3(L) 12.2(L) 11.6(L)  Hematocrit 39.0 - 52.0 % 34.1(L) 37.5(L) 34.9(L)  Platelets 150 - 400 K/uL 235 281 194.0   CMP Latest Ref Rng & Units 07/07/2018 07/06/2018 05/18/2018  Glucose 70 - 99 mg/dL 117(H) 159(H) 108(H)  BUN 8 - 23 mg/dL 17 20 16   Creatinine 0.61 - 1.24 mg/dL 1.06 1.19 1.21   Sodium 135 - 145 mmol/L 137 138 139  Potassium 3.5 - 5.1 mmol/L 4.1 4.8 4.4  Chloride 98 - 111 mmol/L 107 106 106  CO2 22 - 32 mmol/L 23 26 23   Calcium 8.9 - 10.3 mg/dL 8.6(L) 8.9 9.3  Total Protein 6.5 - 8.1 g/dL - - 7.6  Total Bilirubin 0.3 - 1.2 mg/dL - - 0.5  Alkaline Phos 38 - 126 U/L - - 83  AST 15 - 41 U/L - - 28  ALT 0 - 44 U/L - - 22   Patient states he did sleep well last night at home.  States he ate and drank his normal breakfast foods.  Patient states he is urinating normally, but patient also reports he has been constipated.  States his last good bowel movement was on Saturday, 07/03/2018.  Patient was prescribed Senokot-S with his discharge, states he has taken 1 dose of this so far.    States the surgical sites seem to be healing well, he is a little tender but otherwise feels good.  Denies any issues breathing.  Review of Systems  Constitutional: Negative for chills, fatigue and fever.  HENT: Negative for congestion, ear pain, sinus pain and sore throat.   Eyes: Negative.   Respiratory: Negative for cough, shortness of breath and wheezing.   Cardiovascular: Negative for chest pain, palpitations and leg swelling.  Gastrointestinal: Negative for abdominal pain, diarrhea, nausea and vomiting. Some tenderness from hernia incision sites. +constipation.  Genitourinary: Negative for dysuria, frequency and urgency.  Musculoskeletal: Negative for arthralgias and myalgias.  Skin: Negative for color change, pallor and rash.  Neurological: Negative for syncope, light-headedness and headaches.  Psychiatric/Behavioral: The patient is not nervous/anxious.       Objective:   Physical Exam   Constitutional: He appears well-developed and well-nourished. No distress.  HENT:  Head: Normocephalic and atraumatic.  Eyes: Pupils are equal, round, and reactive to light. Conjunctivae and EOM are normal. No scleral icterus.  Neck: Normal range of motion. Neck supple. No tracheal deviation  present.  Cardiovascular: Normal rate, regular rhythm and normal heart sounds.  Pulmonary/Chest: Effort normal and breath sounds normal. No respiratory distress. He has no wheezes. He has no rales. He has no rhonchi.  Abdominal: Soft. Bowel sounds are normal. ABD not distended. No rebound or guarding. 3 small incisions from surgery, all seem to be healing well, surrounding skin is normal in color/no drainage from incision.  Neurological: He is alert and oriented to person, place, and time.  Gait normal  Skin: Skin is warm and dry. He is not diaphoretic. No pallor.  Psychiatric: He has a normal mood and affect. His behavior is normal. Thought content normal.   Nursing note and vitals reviewed.    Vitals:   07/08/18 0906  BP: 118/78  Pulse: 73  Temp: 98 F (36.7 C)  SpO2: 91%   Assessment & Plan:   Umbilical hernia and bilateral inguinal hernia repair-surgery repair of the sites have gone well.  Incisions seem to be healing well.  Patient advised to monitor areas for  any drainage or surrounding redness.  We will get repeat CBC in clinic today due to slightly elevated white count before leaving hospital.  Acute respiratory failure with hypoxia/COPD- patient was admitted after surgery due to acute respiratory failure most likely related to his long history of COPD and being a smoker.  Patient's breathing is stable today.   Constipation- patient advised to continue taking Senokot as prescribed by hospital and also to add MiraLAX 1 scoop daily to medication regimen.  He will also take a dose of Dulcolax today, he is advised that if he does not have a bowel movement for the next 1 to 2 days he can drink the magnesium citrate.  Patient advised to continue taking Senokot and MiraLAX even after having a bowel movement to ensure that his stools remain soft and he avoids any straining.  Patient has follow-up with surgeon coming up next week.  Advised to be sure did not keep this appointment.  He will  follow-up here in approximately 3 to 4 weeks for recheck of chronic medical conditions.  Advised he can return to clinic anytime if issues arise.

## 2018-07-08 NOTE — Telephone Encounter (Signed)
First attempt made for TCM , left message for patient to return call to office , patient is scheduled  from hospital for [place].

## 2018-07-19 ENCOUNTER — Ambulatory Visit (INDEPENDENT_AMBULATORY_CARE_PROVIDER_SITE_OTHER): Payer: Medicare HMO | Admitting: Surgery

## 2018-07-19 ENCOUNTER — Encounter: Payer: Self-pay | Admitting: Surgery

## 2018-07-19 VITALS — BP 128/70 | HR 72 | Temp 97.8°F | Resp 12 | Ht 71.0 in | Wt 265.0 lb

## 2018-07-19 DIAGNOSIS — Z09 Encounter for follow-up examination after completed treatment for conditions other than malignant neoplasm: Secondary | ICD-10-CM

## 2018-07-19 NOTE — Patient Instructions (Addendum)
Return as needed. No lifting over 20 pounds for the next three weeks. The patient is aware to call back for any questions or concerns.

## 2018-07-19 NOTE — Progress Notes (Signed)
S/p Uh and ROB BIH Doing very well No pain Taking PO and ambulating  PE NAD Abd: soft, nt, incisions c/d/i, no infection or recurrence  A/p Doing very well No heavy lifting for the next 4 weeks RTC prn

## 2018-08-05 ENCOUNTER — Encounter: Payer: Self-pay | Admitting: Family Medicine

## 2018-08-05 ENCOUNTER — Ambulatory Visit (INDEPENDENT_AMBULATORY_CARE_PROVIDER_SITE_OTHER): Payer: Medicare HMO | Admitting: Family Medicine

## 2018-08-05 VITALS — BP 122/80 | HR 77 | Temp 98.6°F | Resp 18 | Ht 71.0 in | Wt 257.2 lb

## 2018-08-05 DIAGNOSIS — R0602 Shortness of breath: Secondary | ICD-10-CM | POA: Diagnosis not present

## 2018-08-05 DIAGNOSIS — F4310 Post-traumatic stress disorder, unspecified: Secondary | ICD-10-CM

## 2018-08-05 DIAGNOSIS — E785 Hyperlipidemia, unspecified: Secondary | ICD-10-CM | POA: Diagnosis not present

## 2018-08-05 DIAGNOSIS — J449 Chronic obstructive pulmonary disease, unspecified: Secondary | ICD-10-CM

## 2018-08-05 DIAGNOSIS — F17210 Nicotine dependence, cigarettes, uncomplicated: Secondary | ICD-10-CM | POA: Insufficient documentation

## 2018-08-05 DIAGNOSIS — I25119 Atherosclerotic heart disease of native coronary artery with unspecified angina pectoris: Secondary | ICD-10-CM | POA: Diagnosis not present

## 2018-08-05 HISTORY — DX: Nicotine dependence, cigarettes, uncomplicated: F17.210

## 2018-08-05 MED ORDER — ALBUTEROL SULFATE (2.5 MG/3ML) 0.083% IN NEBU: 2.5000 mg | INHALATION_SOLUTION | Freq: Once | RESPIRATORY_TRACT | Status: DC

## 2018-08-05 MED ORDER — ALBUTEROL SULFATE (2.5 MG/3ML) 0.083% IN NEBU: 2.5000 mg | INHALATION_SOLUTION | RESPIRATORY_TRACT | 3 refills | Status: DC | PRN

## 2018-08-05 NOTE — Progress Notes (Signed)
Subjective:    Patient ID: Christopher Orr, male    DOB: 04/23/1946, 73 y.o.   MRN: [id number]  HPI  Presents to clinic for follow up on COPD,lipids, CAD, PTSD  Patient takes Symbicort inhaler twice daily and has albuterol as needed.  Patient states he is feeling a little short of breath today, tapering out of his nebulizer solution at home and in the past when he has episodes of shortness of breath he will use albuterol nebulizer and feel much better.  He continues to smoke about a pack per day.  Denies fever or chills, denies cough or any phlegm production.  Lipids have been stable.  Patient tolerating statin without any issues.  No myalgias or right upper quadrant tenderness.  Patient has history of coronary artery disease, he was referred to cardiology back in October after episode of chest pain that brought him to emergency room, patient has not yet seen cardiology due to having to have surgery and dealing with all of that.  Patient is still agreeable to cardiology referral for continued monitoring of his coronary artery disease.  Currently denies any chest pain, palpitations or lower extremity swelling.  Patient follows regularly with psychiatry for management of PTSD, sees Dr. Randel Books at the [facility].   Patient Active Problem List   Diagnosis Date Noted  . Acute respiratory failure with hypoxia ([place]) 07/06/2018  . Bilateral inguinal hernia, without obstruction or gangrene, recurrent   . Umbilical hernia without obstruction and without gangrene   . Panic attacks 06/14/2018  . Benzodiazepine dependence ([place]) 06/14/2018  . Post traumatic stress disorder (PTSD) 06/14/2018  . Episode of recurrent major depressive disorder ([place]) 06/14/2018  . Coronary artery disease involving native heart with angina pectoris ([place]) 05/31/2018  . Osteoarthritis of multiple joints 05/31/2018  . Hyperlipidemia 05/31/2018  . Chronic viral hepatitis C ([place]) 09/24/2014  . Chest pain 07/28/2014  . COPD  (chronic obstructive pulmonary disease) ([place]) 07/28/2014  . Anxiety 07/28/2014  . Aspiration pneumonia ([place]) 07/28/2014  . Elevated troponin I level 07/28/2014  . Acute kidney injury ([place]) 07/28/2014  . GERD (gastroesophageal reflux disease) 07/28/2014   Social History   Tobacco Use  . Smoking status: Current Every Day Smoker    Packs/day: 1.00    Types: Cigarettes    Start date: 07/28/1962    Last attempt to quit: 07/28/2012    Years since quitting: 6.0  . Smokeless tobacco: Never Used  Substance Use Topics  . Alcohol use: Yes    Alcohol/week: 6.0 - 14.0 standard drinks    Types: 6 - 14 Cans of beer per week    Comment: 6pack and fifth of liquor each day   Review of Systems  Constitutional: Negative for chills, fatigue and fever.  HENT: Negative for congestion, ear pain, sinus pain and sore throat.   Eyes: Negative.   Respiratory: Negative for cough. +shortness of breath and wheezing.   Cardiovascular: Negative for chest pain, palpitations and leg swelling.  Gastrointestinal: Negative for abdominal pain, diarrhea, nausea and vomiting.  Genitourinary: Negative for dysuria, frequency and urgency.  Musculoskeletal: Negative for arthralgias and myalgias.  Skin: Negative for color change, pallor and rash.  Neurological: Negative for syncope, light-headedness and headaches.  Psychiatric/Behavioral: The patient is not nervous/anxious.       Objective:   Physical Exam Physical Exam  Constitutional: He is oriented to person, place, and time. He appears well-developed and well-nourished. No distress.  HENT:  Head: Normocephalic and atraumatic.  Eyes: Pupils  are equal, round, and reactive to light. Conjunctivae and EOM are normal. No scleral icterus.  Neck: Normal range of motion. Neck supple. No tracheal deviation present.  Cardiovascular: Normal rate, regular rhythm and normal heart sounds.  Pulmonary/Chest: Effort normal and breath sounds normal. No respiratory distress. +wheezes  throughout lung fields, better after neb Abdominal: Soft. Bowel sounds are normal. There is no tenderness. Surgical incisions from hernia repair healed well.  Neurological: He is alert and oriented to person, place, and time.  Gait normal  Skin: Skin is warm and dry. He is not diaphoretic. No pallor.  Psychiatric: He has a normal mood and affect. His behavior is normal. Thought content normal.  Nursing note and vitals reviewed.  O2 sats better after neb treatment: Vitals:   08/05/18 1117 08/05/18 1145  BP: 122/80   Pulse: 77   Resp: 18   Temp: 98.6 F (37 C)   SpO2: (!) 88% 93%      Assessment & Plan:   COPD/shortness of breath/smoker -patient's shortness of breath improved in office after giving breathing treatment x1.  Patient given refill of albuterol nebulizer solution to use at home if he is having episodes of shortness of breath.  He will continue use his Symbicort twice per day and has an albuterol inhaler that he carries with him when he is out and about.  Strongly encourage patient to work on cutting back on his smoking, states he will consider doing this.  Hyperlipidemia-patient tolerating statin without any issues.  We will plan to recheck cholesterol levels at next office visit.  Coronary artery disease- new referral for cardiology placed, patient was referred from the emergency department back in October 2019, but it does not appear appointment was ever set up.  PTSD-patient follows regularly with psychiatry.  Patient will follow-up here in approximately 2 months for management of chronic medical conditions.  He is aware he return to clinic sooner if any issues arise.

## 2018-08-11 ENCOUNTER — Other Ambulatory Visit: Payer: Self-pay | Admitting: Lab

## 2018-08-11 ENCOUNTER — Telehealth: Payer: Self-pay | Admitting: Family Medicine

## 2018-08-11 DIAGNOSIS — D619 Aplastic anemia, unspecified: Secondary | ICD-10-CM

## 2018-08-11 MED ORDER — FOLIC ACID 1 MG PO TABS: 1.0000 mg | ORAL_TABLET | Freq: Every day | ORAL | 2 refills | Status: DC

## 2018-08-11 NOTE — Telephone Encounter (Signed)
Yes.  OK to refill. 

## 2018-08-11 NOTE — Telephone Encounter (Signed)
Copied from [place] [id number] [phone]. Topic: Quick Communication - Rx Refill/Question >> Aug 11, 2018  9:11 AM Margot Ables wrote: Medication: folic acid (FOLVITE) 1 MG tablet taking 1/day - former provider was on last RX for patient - pt has requested refill from the pharmacy (last fill date 04/30/2018 90 day supply)  Has the patient contacted their pharmacy? yes Preferred Pharmacy (with phone number or street name): [id number] [address], [place] [phone] (Phone) [phone] (Fax)

## 2018-08-19 ENCOUNTER — Other Ambulatory Visit: Payer: Self-pay | Admitting: Family Medicine

## 2018-08-19 DIAGNOSIS — M159 Polyosteoarthritis, unspecified: Secondary | ICD-10-CM

## 2018-08-19 MED ORDER — CLOPIDOGREL BISULFATE 75 MG PO TABS: 75.0000 mg | ORAL_TABLET | Freq: Every day | ORAL | 3 refills | Status: DC

## 2018-08-19 MED ORDER — ROSUVASTATIN CALCIUM 10 MG PO TABS: 10.0000 mg | ORAL_TABLET | Freq: Every day | ORAL | 3 refills | Status: DC

## 2018-08-19 MED ORDER — TAMSULOSIN HCL 0.4 MG PO CAPS: 0.4000 mg | ORAL_CAPSULE | Freq: Every day | ORAL | 3 refills | Status: DC

## 2018-08-19 MED ORDER — METOPROLOL SUCCINATE ER 25 MG PO TB24: 25.0000 mg | ORAL_TABLET | Freq: Every day | ORAL | 3 refills | Status: DC

## 2018-08-19 MED ORDER — PANTOPRAZOLE SODIUM 40 MG PO TBEC: 40.0000 mg | DELAYED_RELEASE_TABLET | Freq: Every day | ORAL | 3 refills | Status: DC

## 2018-08-19 MED ORDER — CELECOXIB 200 MG PO CAPS: 200.0000 mg | ORAL_CAPSULE | Freq: Every day | ORAL | 1 refills | Status: DC

## 2018-08-19 NOTE — Telephone Encounter (Signed)
Copied from [place] [id number]. Topic: Quick Communication - Rx Refill/Question >> Aug 19, 2018 11:45 AM Alanda Slim E wrote: Medication: clopidogrel (PLAVIX) 75 MG tablet   celecoxib (CELEBREX) 200 MG capsule   metoprolol succinate (TOPROL-XL) 25 MG 24 hr tablet  pantoprazole (PROTONIX) 40 MG tablet   rosuvastatin (CRESTOR) 10 MG tablet   tamsulosin (FLOMAX) 0.4 MG CAPS capsule  Has the patient contacted their pharmacy? No  Preferred Pharmacy (with phone number or street name): [id number] [address], [place] [phone] (Phone) [phone] (Fax)    Agent: Please be advised that RX refills may take up to 3 business days. We ask that you follow-up with your pharmacy.

## 2018-08-31 ENCOUNTER — Ambulatory Visit: Payer: Medicare HMO | Admitting: Family Medicine

## 2018-09-15 NOTE — Progress Notes (Signed)
Pt was given a 1 time Nebulizer treatment in the office. The Albuterol refill was a prescription for the Pt to pick up from the pharmacy.

## 2018-10-04 ENCOUNTER — Other Ambulatory Visit: Payer: Self-pay | Admitting: Family Medicine

## 2018-10-04 ENCOUNTER — Ambulatory Visit (INDEPENDENT_AMBULATORY_CARE_PROVIDER_SITE_OTHER): Payer: Medicare HMO | Admitting: Family Medicine

## 2018-10-04 ENCOUNTER — Ambulatory Visit (INDEPENDENT_AMBULATORY_CARE_PROVIDER_SITE_OTHER): Payer: Medicare HMO

## 2018-10-04 ENCOUNTER — Encounter: Payer: Self-pay | Admitting: Family Medicine

## 2018-10-04 VITALS — BP 100/62 | HR 77 | Temp 98.3°F | Resp 18 | Ht 72.0 in | Wt 259.0 lb

## 2018-10-04 DIAGNOSIS — J449 Chronic obstructive pulmonary disease, unspecified: Secondary | ICD-10-CM

## 2018-10-04 DIAGNOSIS — F419 Anxiety disorder, unspecified: Secondary | ICD-10-CM

## 2018-10-04 DIAGNOSIS — F17210 Nicotine dependence, cigarettes, uncomplicated: Secondary | ICD-10-CM

## 2018-10-04 DIAGNOSIS — I25119 Atherosclerotic heart disease of native coronary artery with unspecified angina pectoris: Secondary | ICD-10-CM

## 2018-10-04 DIAGNOSIS — M79605 Pain in left leg: Secondary | ICD-10-CM

## 2018-10-04 DIAGNOSIS — F339 Major depressive disorder, recurrent, unspecified: Secondary | ICD-10-CM

## 2018-10-04 DIAGNOSIS — L989 Disorder of the skin and subcutaneous tissue, unspecified: Secondary | ICD-10-CM

## 2018-10-04 DIAGNOSIS — F4310 Post-traumatic stress disorder, unspecified: Secondary | ICD-10-CM

## 2018-10-04 DIAGNOSIS — Z716 Tobacco abuse counseling: Secondary | ICD-10-CM

## 2018-10-04 MED ORDER — VARENICLINE TARTRATE 0.5 MG X 11 & 1 MG X 42 PO MISC: ORAL | 0 refills | Status: DC

## 2018-10-04 MED ORDER — VARENICLINE TARTRATE 1 MG PO TABS: 1.0000 mg | ORAL_TABLET | Freq: Two times a day (BID) | ORAL | 1 refills | Status: DC

## 2018-10-04 NOTE — Progress Notes (Signed)
Subjective:    Patient ID: Christopher Orr, male    DOB: 20-Jan-1946, 73 y.o.   MRN: [id number]  HPI   Patient presents to clinic to follow-up on COPD, coronary artery disease/BP, mood.  Breathing has been stable.  Feels well on his Symbicort inhaler.  Has not needed to use albuterol inhaler in the past 2 months.  Blood pressure remains stable on current medications.  He follows regularly with cardiology, cardiology appointment coming up next week.  Patient continues to follow regularly with [facility] for his mental health medications.  Currently mood is stable.  Denies any breakthrough anxiety or depression.  Patient would like to work on quitting smoking.  He has taken Chantix approximately 4 years ago, and did have success with smoking cessation for about 2 years.  He did resume smoking however.  Also complains of pain in left hip/groin area.  Did have bilateral inguinal hernia repair previously, wonders if possibly pains from this or if it is in hip joint.  Denies any fall or recent injury.  Also reports skin lesion on face he would like removed.    Patient Active Problem List   Diagnosis Date Noted  . Cigarette smoker 08/05/2018  . Acute respiratory failure with hypoxia ([place]) 07/06/2018  . Bilateral inguinal hernia, without obstruction or gangrene, recurrent   . Umbilical hernia without obstruction and without gangrene   . Panic attacks 06/14/2018  . Benzodiazepine dependence ([place]) 06/14/2018  . Post traumatic stress disorder (PTSD) 06/14/2018  . Episode of recurrent major depressive disorder ([place]) 06/14/2018  . Coronary artery disease involving native heart with angina pectoris ([place]) 05/31/2018  . Osteoarthritis of multiple joints 05/31/2018  . Hyperlipidemia 05/31/2018  . Chronic viral hepatitis C ([place]) 09/24/2014  . Chest pain 07/28/2014  . COPD (chronic obstructive pulmonary disease) ([place]) 07/28/2014  . Anxiety 07/28/2014  . Aspiration pneumonia ([place]) 07/28/2014  . Elevated  troponin I level 07/28/2014  . Acute kidney injury ([place]) 07/28/2014  . GERD (gastroesophageal reflux disease) 07/28/2014   Social History   Tobacco Use  . Smoking status: Current Every Day Smoker    Packs/day: 1.00    Types: Cigarettes    Start date: 07/28/1962    Last attempt to quit: 07/28/2012    Years since quitting: 6.1  . Smokeless tobacco: Never Used  Substance Use Topics  . Alcohol use: Yes    Alcohol/week: 6.0 - 14.0 standard drinks    Types: 6 - 14 Cans of beer per week    Comment: 6pack and fifth of liquor each day   Review of Systems  Constitutional: Negative for chills, fatigue and fever.  HENT: Negative for congestion, ear pain, sinus pain and sore throat.   Eyes: Negative.   Respiratory: Negative for cough, shortness of breath and wheezing.   Cardiovascular: Negative for chest pain, palpitations and leg swelling.  Gastrointestinal: Negative for abdominal pain, diarrhea, nausea and vomiting.  Genitourinary: Negative for dysuria, frequency and urgency.  Musculoskeletal: +left hip/groin pain at times Skin: Negative for color change, pallor and rash.  Neurological: Negative for syncope, light-headedness and headaches.  Psychiatric/Behavioral: The patient is not nervous/anxious.       Objective:   Physical Exam Vitals signs and nursing note reviewed.  Constitutional:      General: He is not in acute distress.    Appearance: He is not toxic-appearing.  HENT:     Head: Normocephalic and atraumatic.  Eyes:     General: No scleral icterus.  Extraocular Movements: Extraocular movements intact.     Conjunctiva/sclera: Conjunctivae normal.     Pupils: Pupils are equal, round, and reactive to light.  Neck:     Musculoskeletal: Neck supple. No neck rigidity or muscular tenderness.  Cardiovascular:     Rate and Rhythm: Normal rate and regular rhythm.  Pulmonary:     Effort: Pulmonary effort is normal.     Breath sounds: Normal breath sounds.  Musculoskeletal:          General: Tenderness (left hip/groin) present.     Comments: Hx of bilat inguinal hernia repair. No hernia palpated. ROM of hips intact. Tolerates adduction/abduction of left leg well.   Lymphadenopathy:     Cervical: No cervical adenopathy.  Skin:    General: Skin is warm and dry.     Coloration: Skin is not jaundiced or pale.     Findings: Lesion (right side of face, near temple. About size of a pea. ) present.  Neurological:     Mental Status: He is alert and oriented to person, place, and time.     Gait: Gait normal.  Psychiatric:        Mood and Affect: Mood normal.        Behavior: Behavior normal.        Thought Content: Thought content normal.    Vitals:   10/04/18 1326  BP: 100/62  Pulse: 77  Resp: 18  Temp: 98.3 F (36.8 C)  SpO2: 91%      Assessment & Plan:   Cigarette smoking-greater than 10 minutes spent discussing smoking cessation options.  Patient would like to use Chantix again due to having success with it in the past.  Offered to send in nicotine gum with objectives, but he declines.  Patient aware of risks of continued smoking including worsening breathing, heart issues, vessel issues, cancers and even death.  Patient is motivated to quit.  COPD-stable on Symbicort.  Has albuterol to use if needed.  Smoking cessation will greatly help his COPD.  Coronary artery disease-blood pressure remained stable.  Keep regular follow-up with cardiology.  Anxiety, depressive disorder, PTSD-mood stable on current medications.  Follows regularly with psychiatry.   Left leg pain- no obvious hernia palpated.  Suspect pain could be related to issue in hip joint.  We will get x-ray to further investigate.  Skin lesion of face- growth on left side of face, will refer to dermatology for eval and removal.  Patient will follow-up here in 1 months for recheck on smoking cessation. He will return to clinic sooner if any issues arise.

## 2018-10-04 NOTE — Telephone Encounter (Signed)
Copied from [place] [phone] [phone]. Topic: Quick Communication - See Telephone Encounter >> Oct 04, 2018  4:49 PM Ivar Drape wrote: CRM for notification. See Telephone encounter for: 10/04/18. Patient would like a refill on his rosuvastatin (CRESTOR) 10 MG tablet medication and have it [facility] [address]

## 2018-10-07 ENCOUNTER — Ambulatory Visit: Payer: Medicare HMO | Admitting: Internal Medicine

## 2018-10-26 ENCOUNTER — Other Ambulatory Visit: Payer: Self-pay | Admitting: Family Medicine

## 2018-10-26 DIAGNOSIS — F17210 Nicotine dependence, cigarettes, uncomplicated: Secondary | ICD-10-CM

## 2018-11-01 ENCOUNTER — Telehealth: Payer: Self-pay | Admitting: Family Medicine

## 2018-11-01 ENCOUNTER — Ambulatory Visit (INDEPENDENT_AMBULATORY_CARE_PROVIDER_SITE_OTHER): Payer: Medicare HMO | Admitting: Family Medicine

## 2018-11-01 ENCOUNTER — Other Ambulatory Visit: Payer: Self-pay

## 2018-11-01 ENCOUNTER — Telehealth: Payer: Self-pay | Admitting: *Deleted

## 2018-11-01 DIAGNOSIS — M5136 Other intervertebral disc degeneration, lumbar region: Secondary | ICD-10-CM | POA: Insufficient documentation

## 2018-11-01 DIAGNOSIS — F4310 Post-traumatic stress disorder, unspecified: Secondary | ICD-10-CM

## 2018-11-01 DIAGNOSIS — J449 Chronic obstructive pulmonary disease, unspecified: Secondary | ICD-10-CM

## 2018-11-01 DIAGNOSIS — F17210 Nicotine dependence, cigarettes, uncomplicated: Secondary | ICD-10-CM

## 2018-11-01 DIAGNOSIS — F410 Panic disorder [episodic paroxysmal anxiety] without agoraphobia: Secondary | ICD-10-CM

## 2018-11-01 DIAGNOSIS — F419 Anxiety disorder, unspecified: Secondary | ICD-10-CM

## 2018-11-01 DIAGNOSIS — Z716 Tobacco abuse counseling: Secondary | ICD-10-CM

## 2018-11-01 MED ORDER — ALPRAZOLAM 2 MG PO TABS: 2.0000 mg | ORAL_TABLET | Freq: Every evening | ORAL | 0 refills | Status: DC | PRN

## 2018-11-01 NOTE — Telephone Encounter (Signed)
Patient has upcoming New patient appointment with Dr Buford Dresser on 10/28/18. Need to assess patient and see if necessary at this time or if could defer. No answer. Left message to call back.

## 2018-11-01 NOTE — Telephone Encounter (Signed)
Please schedule for a 2 month follow up, in office for in-person visit please  Thanks,  LG

## 2018-11-01 NOTE — Progress Notes (Signed)
Virtual Visit via Telephone Note  I connected with Christopher Orr on 11/01/18 at  1:00 PM EDT by telephone and verified that I am speaking with the correct person using two identifiers.   I discussed the limitations, risks, security and privacy concerns of performing an evaluation and management service by telephone and the availability of in person appointments. I also discussed with the patient that there may be a patient responsible charge related to this service. The patient expressed understanding and agreed to proceed.   History of Present Illness:  Patient not contacted via telephone to discuss his smoking cessation progress and also to follow-up on his low back/hip pains.  Patient has finished the first month of the Chantix starter pack, and states he stopped smoking cigarettes approximately 5 days ago.  He is pleased with his success with Chantix, states he does not even really crave cigarettes much anymore.  Denies feeling shaky or jittery.  Denies chest pain.  Denies shortness of breath or wheezing.  Overall feels his breathing is stable.  Does have a chronic cough related to smoking and COPD, but this cough is unchanged for many years.  Patient has low back and some right-sided hip pain.  We did do a x-ray of hip to rule out any issues in the hip joint itself.  X-ray of hip was negative.  Patient has had imaging in the past via CT of abdomen pelvis that did reveal degenerative disc disease in the lumbar spine.  Patient has been using Tylenol as needed for pain and trying to get up and move around if he feels stiff with overall decent effect.  Patient has been remaining home as much as possible during this novel coronavirus pandemic.  He has good support from his son who helps with groceries.  Patient also requests refill of his Xanax.  Patient has been on Xanax for years to help with anxiety and sleep due to history of panic attacks, PTSD and generalized anxiety disorder.  Patient  Active Problem List   Diagnosis Date Noted  . Degenerative disc disease, lumbar 11/01/2018  . Cigarette smoker 08/05/2018  . Acute respiratory failure with hypoxia ([place]) 07/06/2018  . Bilateral inguinal hernia, without obstruction or gangrene, recurrent   . Umbilical hernia without obstruction and without gangrene   . Panic attacks 06/14/2018  . Benzodiazepine dependence ([place]) 06/14/2018  . Post traumatic stress disorder (PTSD) 06/14/2018  . Episode of recurrent major depressive disorder ([place]) 06/14/2018  . Coronary artery disease involving native heart with angina pectoris ([place]) 05/31/2018  . Osteoarthritis of multiple joints 05/31/2018  . Hyperlipidemia 05/31/2018  . Chronic viral hepatitis C ([place]) 09/24/2014  . Chest pain 07/28/2014  . COPD (chronic obstructive pulmonary disease) ([place]) 07/28/2014  . Anxiety 07/28/2014  . Aspiration pneumonia ([place]) 07/28/2014  . Elevated troponin I level 07/28/2014  . Acute kidney injury ([place]) 07/28/2014  . GERD (gastroesophageal reflux disease) 07/28/2014   Social History   Tobacco Use  . Smoking status: Former Smoker    Packs/day: 1.00    Types: Cigarettes    Start date: 07/28/1962    Last attempt to quit: 10/26/2018    Years since quitting: 0.0  . Smokeless tobacco: Never Used  Substance Use Topics  . Alcohol use: Yes    Alcohol/week: 6.0 standard drinks    Types: 6 Cans of beer per week    Observations/Objective:  When speaking to patient on phone he is alert and oriented.  He is speaking fluently without any  issues or appearing to be breathless.  Patient is in good spirits and is proud of himself for his success with quitting smoking.  He is hopeful that his breathing will continue to improve.  Assessment and Plan:  Smoking cessation-patient will continue Chantix for at least another 8 to 12 weeks to ensure that the smoking cessation sticks.  Patient also aware that he can chew nicotine gum or suck on a nicotine lozenge if needed for  a nicotine craving.  COPD-breathing stable at this time.  I am hopeful that will improve the longer he is cigarette free.  Anxiety/PTSD/panic attacks- Xanax refill given.  [facility] PMP registry checked and patient is due for this refill in a few days, prescription dated to not be filled until its proper due date.  Degenerative disc disease lumbar spine- suspect patient's low back pain and hip pain is related to degenerative disc disease, patient encouraged to use Tylenol if needed for any soreness.  Also advised to do stretching and range of motion exercises every day to help keep self from becoming too stiff.  Also suggested he can do a heating pad or use topical medication such as BenGay or Biofreeze on sore joints to help reduce pain.   Follow Up Instructions:  We will have patient follow-up here in 2 months for recheck of smoking cessation as well as multiple other medical issues.  He is aware he can call clinic if any issues arise.   I discussed the assessment and treatment plan with the patient. The patient was provided an opportunity to ask questions and all were answered. The patient agreed with the plan and demonstrated an understanding of the instructions.   The patient was advised to call back or seek an in-person evaluation if the symptoms worsen or if the condition fails to improve as anticipated.  I provided 15 minutes of non-face-to-face time during this encounter.   Jodelle Green, FNP

## 2018-11-02 ENCOUNTER — Encounter: Payer: Self-pay | Admitting: Family Medicine

## 2018-11-02 NOTE — Telephone Encounter (Signed)
Called Pt and scheduled for 01/03/2019 at 1:00pm in office visit

## 2018-11-03 ENCOUNTER — Telehealth: Payer: Self-pay | Admitting: *Deleted

## 2018-11-03 NOTE — Telephone Encounter (Signed)
Spoke with pt concerning upcoming appointment. Pt mentioned that he is doing well and doesn't think his appointment will be necessary at this time. Pt refused virtual visit.

## 2018-11-04 NOTE — Telephone Encounter (Signed)
No answer. Left detailed message, ok per DPR.  On message said he could call us if any new questions or concerns arise and to call back if he needs appointment rescheduled.

## 2018-11-10 ENCOUNTER — Other Ambulatory Visit: Payer: Medicare HMO

## 2018-11-10 ENCOUNTER — Encounter: Payer: Self-pay | Admitting: Urology

## 2018-11-17 ENCOUNTER — Ambulatory Visit: Payer: Medicare HMO | Admitting: Cardiology

## 2018-12-13 ENCOUNTER — Other Ambulatory Visit: Payer: Medicare HMO

## 2018-12-13 ENCOUNTER — Other Ambulatory Visit: Payer: Self-pay

## 2018-12-13 DIAGNOSIS — C61 Malignant neoplasm of prostate: Secondary | ICD-10-CM

## 2018-12-14 ENCOUNTER — Telehealth: Payer: Self-pay

## 2018-12-14 LAB — PSA: Prostate Specific Ag, Serum: 0.3 ng/mL (ref 0.0–4.0)

## 2018-12-14 NOTE — Telephone Encounter (Signed)
-----   Message from Hollice Espy, MD sent at 12/14/2018  9:09 AM EDT ----- PSA is great, 0.3.   See you in 6 months.  Hollice Espy, MD

## 2018-12-14 NOTE — Telephone Encounter (Signed)
Mychart notification sent 

## 2019-01-03 ENCOUNTER — Ambulatory Visit: Payer: Medicare HMO | Admitting: Family Medicine

## 2019-01-25 ENCOUNTER — Other Ambulatory Visit: Payer: Self-pay | Admitting: Urology

## 2019-01-26 DIAGNOSIS — F4310 Post-traumatic stress disorder, unspecified: Secondary | ICD-10-CM | POA: Diagnosis not present

## 2019-01-26 DIAGNOSIS — G4700 Insomnia, unspecified: Secondary | ICD-10-CM | POA: Diagnosis not present

## 2019-01-26 DIAGNOSIS — F515 Nightmare disorder: Secondary | ICD-10-CM | POA: Diagnosis not present

## 2019-01-26 DIAGNOSIS — F331 Major depressive disorder, recurrent, moderate: Secondary | ICD-10-CM | POA: Diagnosis not present

## 2019-02-01 ENCOUNTER — Other Ambulatory Visit: Payer: Self-pay

## 2019-02-01 ENCOUNTER — Encounter: Payer: Self-pay | Admitting: Family Medicine

## 2019-02-01 ENCOUNTER — Ambulatory Visit (INDEPENDENT_AMBULATORY_CARE_PROVIDER_SITE_OTHER): Payer: Medicare HMO | Admitting: Family Medicine

## 2019-02-01 VITALS — BP 124/78 | HR 70 | Temp 98.7°F | Resp 16 | Ht 72.0 in | Wt 254.8 lb

## 2019-02-01 DIAGNOSIS — R4189 Other symptoms and signs involving cognitive functions and awareness: Secondary | ICD-10-CM

## 2019-02-01 MED ORDER — DONEPEZIL HCL 5 MG PO TABS: 5.0000 mg | ORAL_TABLET | Freq: Every day | ORAL | 1 refills | Status: DC

## 2019-02-01 NOTE — Patient Instructions (Signed)
Update insurance card so we can recheck labs   Dementia Dementia is a condition that affects the way the brain works. It often affects memory and thinking. There are many types of dementia. Some types get worse with time and cannot be reversed. Some types of dementia include:  Alzheimer's disease. This is the most common type.  Vascular dementia. This type may happen due to a stroke.  Lewy body dementia. This type may happen to people who have Parkinson's disease.  Frontotemporal dementia. This type is caused by damage to nerve cells in certain parts of the brain. Some people may have more than one type, and this is called mixed dementia. What are the causes? This condition is caused by damage to cells in the brain. Some causes that cannot be reversed include:  Having a condition that affects the blood vessels of the brain, such as diabetes, heart disease, or blood vessel disease.  Changes to genes. Some causes that can be reversed or slowed include:  Injury to the brain.  Certain medicines.  Infection.  Not having enough vitamin B12 in the body, or thyroid problems.  A tumor or blood clot in the brain. What are the signs or symptoms? Symptoms depend on the type of dementia. This may include:  Problems remembering things.  Having trouble taking a bath or putting clothes on.  Forgetting appointments.  Forgetting to pay bills.  Trouble planning and making meals.  Having trouble speaking.  Getting lost easily. How is this treated? Treatment depends on the cause of the dementia. It might include taking medicines that help:  To control the dementia.  To slow down the dementia.  To manage symptoms. In some cases, treating the cause of your dementia can improve symptoms, reverse symptoms, or slow down how quickly it gets worse. Your doctor can help you find support groups and other doctors who can help with your care. Follow these instructions at home: Medicines   Take over-the-counter and prescription medicines only as told by your doctor.  Use a pill organizer to help you manage your medicines.  Avoidtaking medicines for pain or for sleep. Lifestyle  Make healthy choices: ? Be active as told by your doctor. ? Do not use any products that contain nicotine or tobacco, such as cigarettes, e-cigarettes, and chewing tobacco. If you need help quitting, ask your doctor. ? Do not drink alcohol. ? When you get stressed, do something that will help you to relax. Your doctor can give you tips. ? Spend time with other people.  Make sure you get good sleep. To get good sleep: ? Try not to take naps during the day. ? Keep your bedroom dark and cool. ? In the few hours before you go to bed, try not to do any exercise. ? Do not have foods and drinks with caffeine at night. Eating and drinking  Drink enough fluid to keep your pee (urine) pale yellow.  Eat a healthy diet. General instructions   Talk with your doctor to figure out: ? What you need help with. ? What your safety needs are.  Ask your doctor if it is safe for you to drive.  If told, wear a bracelet that tracks where you are or shows that you are a person with memory loss.  Work with your family to make big decisions.  Keep all follow-up visits as told by your doctor. This is important. Contact a doctor if:  You have any new symptoms.  Your symptoms get worse.  You have problems with swallowing or choking. Get help right away if:  You feel very sad, or feel that you want to harm yourself.  You or your family members are worried for your safety. If you ever feel like you may hurt yourself or others, or have thoughts about taking your own life, get help right away. You can go to your nearest emergency department or call:  Your local emergency services (911 in the [place]  A suicide crisis helpline, such as the [facility] at 848-046-6114. This is open 24  hours a day. Summary  Dementia often affects memory and thinking.  Some types of dementia get worse with time and cannot be reversed.  Treatment for this condition depends on the cause.  Talk with your doctor to figure out what you need help with.  Your doctor can help you find support groups and other doctors who can help with your care. This information is not intended to replace advice given to you by your health care provider. Make sure you discuss any questions you have with your health care provider. Document Released: 06/26/2008 Document Revised: 09/28/2018 Document Reviewed: 09/28/2018 Elsevier Patient Education  2020 [employer].

## 2019-02-01 NOTE — Progress Notes (Signed)
Subjective:    Patient ID: Christopher Orr, male    DOB: 1946/01/29, 73 y.o.   MRN: [id number]  HPI   Patient presents to clinic due to concerns over worsening forgetfulness and concerns he has dementia.  Patient states he has been becoming more forgetful over the past few years.  States he made a normal state of day when he forgot the couple of his grandchildren's names.  Patient states for the most part he feels he functions fairly well.  Wrist pay all of his bills, but does state a lot of them are set on automatic withdrawal.  States there has been a few occasions where he is gotten lost while driving in an area that he should have been able to find his way.   Patient Active Problem List   Diagnosis Date Noted  . Degenerative disc disease, lumbar 11/01/2018  . Cigarette smoker 08/05/2018  . Acute respiratory failure with hypoxia ([place]) 07/06/2018  . Bilateral inguinal hernia, without obstruction or gangrene, recurrent   . Umbilical hernia without obstruction and without gangrene   . Panic attacks 06/14/2018  . Benzodiazepine dependence ([place]) 06/14/2018  . Post traumatic stress disorder (PTSD) 06/14/2018  . Episode of recurrent major depressive disorder ([place]) 06/14/2018  . Coronary artery disease involving native heart with angina pectoris ([place]) 05/31/2018  . Osteoarthritis of multiple joints 05/31/2018  . Hyperlipidemia 05/31/2018  . Chronic viral hepatitis C ([place]) 09/24/2014  . Chest pain 07/28/2014  . COPD (chronic obstructive pulmonary disease) ([place]) 07/28/2014  . Anxiety 07/28/2014  . Aspiration pneumonia ([place]) 07/28/2014  . Elevated troponin I level 07/28/2014  . Acute kidney injury ([place]) 07/28/2014  . GERD (gastroesophageal reflux disease) 07/28/2014   Social History   Tobacco Use  . Smoking status: Former Smoker    Packs/day: 1.00    Types: Cigarettes    Start date: 07/28/1962    Quit date: 10/26/2018    Years since quitting: 0.2  . Smokeless tobacco: Never Used   Substance Use Topics  . Alcohol use: Yes    Alcohol/week: 6.0 standard drinks    Types: 6 Cans of beer per week    Review of Systems   Constitutional: Negative for chills, fatigue and fever.  HENT: Negative for congestion, ear pain, sinus pain and sore throat.   Eyes: Negative.   Respiratory: Negative for cough, shortness of breath and wheezing.   Cardiovascular: Negative for chest pain, palpitations and leg swelling.  Gastrointestinal: Negative for abdominal pain, diarrhea, nausea and vomiting.  Genitourinary: Negative for dysuria, frequency and urgency.  Musculoskeletal: Negative for arthralgias and myalgias.  Skin: Negative for color change, pallor and rash.  Neurological: Negative for syncope, light-headedness and headaches.  Psychiatric/Behavioral: The patient is not nervous/anxious. +increased forgetfulness     Objective:   Physical Exam Vitals signs and nursing note reviewed.  Constitutional:      General: He is not in acute distress.    Appearance: He is obese. He is not toxic-appearing or diaphoretic.  Eyes:     General: No scleral icterus.    Extraocular Movements: Extraocular movements intact.     Pupils: Pupils are equal, round, and reactive to light.  Cardiovascular:     Rate and Rhythm: Normal rate and regular rhythm.     Heart sounds: Normal heart sounds.  Pulmonary:     Effort: Pulmonary effort is normal. No respiratory distress.  Skin:    General: Skin is warm and dry.     Coloration: Skin is  not pale.  Neurological:     Mental Status: He is alert and oriented to person, place, and time.     Gait: Gait normal.  Psychiatric:        Mood and Affect: Mood normal.        Behavior: Behavior normal.        Thought Content: Thought content normal.     Comments: Mini cog exam performed with patient.  He is able to successfully draw a clock face.  He was able to spell the word world backwards.  He is able to tell me the year, month, season, president's name and  the type of clinic we were in.  Patient did have a tough time with word recall, 3 words he was asked to remember were apple, table and penny and was only able to remember apple.    Today's Vitals   02/01/19 1131  BP: 124/78  Pulse: 70  Resp: 16  Temp: 98.7 F (37.1 C)  TempSrc: Oral  SpO2: 92%  Weight: 254 lb 12.8 oz (115.6 kg)  Height: 6' (1.829 m)   Body mass index is 34.56 kg/m.    Assessment & Plan:    A total of 25  minutes were spent face-to-face with the patient during this encounter and over half of that time was spent on counseling and coordination of care. The patient was counseled on mini cog exam results, new medication, work up plan    Mild cognitive impairment-patient's mini cog exam overall went well.  Having difficulty with word recall portion of the exam, could indicate he has some mild cognitive impairment occurring.  Mini cog exam results in combination with concerns over his increasing forgetfulness prompted me to refer him to neurology for further evaluation and work-up.  Patient has a long history of smoking and CAD which make me believe he potentially could have early symptoms of a vascular dementia.  We will begin him on 5 mg of Aricept daily. patient advised that he would like to repeat some blood work to look at electrolyte and vitamin levels, but he like to hold off at this time due to having to get his insurance card switched over to reflect our clinic's name so he can have his insurance billed appropriately.  Discussed different brain training activities he can do including puzzles, easy math games, reading books and other short stories.  Patient advised that he is not hearing about neurology referral in the next 7 days let us know.  Also made patient aware to let us know once he has insurance card changes in place so we can have him come in for blood work.  We will plan to have patient follow-up in approximately 6 weeks to see how he is doing since addition of  Aricept.

## 2019-03-02 DIAGNOSIS — G3184 Mild cognitive impairment, so stated: Secondary | ICD-10-CM | POA: Diagnosis not present

## 2019-03-02 DIAGNOSIS — F329 Major depressive disorder, single episode, unspecified: Secondary | ICD-10-CM | POA: Diagnosis not present

## 2019-03-02 DIAGNOSIS — F419 Anxiety disorder, unspecified: Secondary | ICD-10-CM | POA: Diagnosis not present

## 2019-03-02 DIAGNOSIS — E538 Deficiency of other specified B group vitamins: Secondary | ICD-10-CM | POA: Diagnosis not present

## 2019-03-05 ENCOUNTER — Other Ambulatory Visit: Payer: Self-pay | Admitting: Family Medicine

## 2019-03-05 DIAGNOSIS — M159 Polyosteoarthritis, unspecified: Secondary | ICD-10-CM

## 2019-03-09 ENCOUNTER — Other Ambulatory Visit (HOSPITAL_COMMUNITY): Payer: Self-pay | Admitting: Neurology

## 2019-03-09 ENCOUNTER — Other Ambulatory Visit: Payer: Self-pay | Admitting: Neurology

## 2019-03-09 DIAGNOSIS — G3184 Mild cognitive impairment, so stated: Secondary | ICD-10-CM

## 2019-03-15 ENCOUNTER — Ambulatory Visit (INDEPENDENT_AMBULATORY_CARE_PROVIDER_SITE_OTHER): Payer: Medicare HMO | Admitting: Family Medicine

## 2019-03-15 ENCOUNTER — Other Ambulatory Visit: Payer: Self-pay

## 2019-03-15 ENCOUNTER — Ambulatory Visit
Admission: RE | Admit: 2019-03-15 | Discharge: 2019-03-15 | Disposition: A | Discharge status: Home / Self Care | Payer: Medicare HMO | Source: Ambulatory Visit | Attending: Family Medicine | Admitting: Family Medicine

## 2019-03-15 ENCOUNTER — Encounter: Payer: Self-pay | Admitting: Family Medicine

## 2019-03-15 ENCOUNTER — Ambulatory Visit: Payer: Self-pay | Admitting: *Deleted

## 2019-03-15 VITALS — BP 140/80 | HR 69 | Temp 97.5°F | Resp 20 | Ht 72.0 in | Wt 260.0 lb

## 2019-03-15 DIAGNOSIS — S2241XA Multiple fractures of ribs, right side, initial encounter for closed fracture: Secondary | ICD-10-CM | POA: Diagnosis not present

## 2019-03-15 DIAGNOSIS — R0781 Pleurodynia: Secondary | ICD-10-CM

## 2019-03-15 DIAGNOSIS — W19XXXA Unspecified fall, initial encounter: Secondary | ICD-10-CM

## 2019-03-15 MED ORDER — TRAMADOL HCL 50 MG PO TABS: 50.0000 mg | ORAL_TABLET | Freq: Three times a day (TID) | ORAL | 0 refills | Status: AC | PRN

## 2019-03-15 MED ORDER — METHYLPREDNISOLONE 4 MG PO TBPK: ORAL_TABLET | ORAL | 0 refills | Status: DC

## 2019-03-15 NOTE — Patient Instructions (Addendum)
Take big deep breaths, 2 deep breaths, at least every 2 to 3 hours. Hug a pillow to brace self with deep breath or cough. Use topical rub like bengay, icy hot or biofreeze on sore area. Use heating pad on sore area.      Rib Contusion  A rib contusion is a deep bruise on your rib area. Contusions are the result of a blunt trauma that causes bleeding and injury to the tissues under the skin. A rib contusion may involve bruising of the ribs and of the skin and muscles in the area. The skin over the contusion may turn blue, purple, or yellow. Minor injuries will give you a painless contusion. More severe contusions may stay painful and swollen for a few weeks. What are the causes? This condition is usually caused by a blow, trauma, or direct force to an area of the body. This often occurs while playing contact sports. What are the signs or symptoms? Symptoms of this condition include:  Swelling and redness of the injured area.  Discoloration of the injured area.  Tenderness and soreness of the injured area.  Pain with or without movement. How is this diagnosed? This condition may be diagnosed based on:  Your symptoms and medical history.  A physical exam.  Imaging tests-such as an X-ray, CT scan, or MRI-to determine if there were internal injuries or broken bones (fractures). How is this treated? This condition may be treated with:  Rest. This is often the best treatment for a rib contusion.  Icing. This reduces swelling and inflammation.  Deep-breathing exercises. These may be recommended to reduce the risk for lung collapse and pneumonia.  Medicines. Over-the-counter or prescription medicines may be given to control pain.  Injection of a numbing medicine around the nerve near your injury (nerve block). Follow these instructions at home:     Medicines  Take over-the-counter and prescription medicines only as told by your health care provider.  Do not drive or use heavy  machinery while taking prescription pain medicine.  If you are taking prescription pain medicine, take actions to prevent or treat constipation. Your health care provider may recommend that you: ? Drink enough fluid to keep your urine pale yellow. ? Eat foods that are high in fiber, such as fresh fruits and vegetables, whole grains, and beans. ? Limit foods that are high in fat and processed sugars, such as fried or sweet foods. ? Take an over-the-counter or prescription medicine for constipation. Managing pain, stiffness, and swelling  If directed, put ice on the injured area: ? Put ice in a plastic bag. ? Place a towel between your skin and the bag. ? Leave the ice on for 20 minutes, 2-3 times a day.  Rest the injured area. Avoid strenuous activity and any activities or movements that cause pain. Be careful during activities and avoid bumping the injured area.  Do not lift anything that is heavier than 5 lb (2.3 kg), or the limit that you are told, until your health care provider says that it is safe. General instructions  Do not use any products that contain nicotine or tobacco, such as cigarettes and e-cigarettes. These can delay healing. If you need help quitting, ask your health care provider.  Do deep-breathing exercises as told by your health care provider.  If you were given an incentive spirometer, use it every 1-2 hours while you are awake, or as recommended by your health care provider. This device measures how well you are filling  your lungs with each breath.  Keep all follow-up visits as told by your health care provider. This is important. Contact a health care provider if you have:  Increased bruising or swelling.  Pain that is not controlled with treatment.  A fever. Get help right away if you:  Have difficulty breathing or shortness of breath.  Develop a continual cough or you cough up thick or bloody sputum.  Feel nauseous or you vomit.  Have pain in your  abdomen. Summary  A rib contusion is a deep bruise on your rib area. Contusions are the result of a blunt trauma that causes bleeding and injury to the tissues under the skin.  The skin overlying the contusion may turn blue, purple, or yellow. Minor injuries may give you a painless contusion. More severe contusions may stay painful and swollen for a few weeks.  Rest the injured area. Avoid strenuous activity and any activities or movements that cause pain. This information is not intended to replace advice given to you by your health care provider. Make sure you discuss any questions you have with your health care provider. Document Released: 04/08/2001 Document Revised: 08/12/2017 Document Reviewed: 08/12/2017 Elsevier Patient Education  2020 [employer].

## 2019-03-15 NOTE — Telephone Encounter (Signed)
  Pt called in c/o right rib pain and pain with a deep breath especially after falling onto his coffee table Saturday.  See triage notes.  I warm transferred him into Lauren Guse's office to [facility] for scheduling.    He did not want to go to the ED if at all possible.  I sent my notes to the office.   Reason for Disposition . [1] MODERATE pain (e.g., interferes with normal activities) AND [2] present > 3 days  Answer Assessment - Initial Assessment Questions 1. MECHANISM: "How did the injury happen?"     I fell Saturday and I think I broke a rib.    It's on the right side.    2. ONSET: "When did the injury happen?" (e.g., minutes, hours, days ago)     Saturday.   I fell.   I was asleep in my recliner and I jumped up when my son came in and I tripped over my bedroom shoes and fell on the coffee table on my right side. 3. LOCATION: "Where on the chest is the injury located?" "Where does it hurt?"     Right side over my ribs 4. CHEST OR RIB PAIN SEVERITY: "How bad is the pain?"  (e.g., Scale 1-10; mild, moderate, or severe)    - MILD (1-3): doesn't interfere with normal activities     - MODERATE (4-7): interferes with normal activities or awakens from sleep    - SEVERE (8-10): excruciating pain, unable to do any normal activities       8-10 on scale with a deep breath.    It also hurts with a deep breath. 5. BREATHING DIFFICULTY: "Are you having any difficulty breathing?" If so, ask "How bad is it?"  (e.g., none, mild, moderate, severe)  6: OTHER SYMPTOMS (e.g., cough, fever, rash)      I'm having trouble breathing..   I do have COPD.    No O2. 7. PREGNANCY: "Is there any chance you are pregnant?" "When was your last menstrual period?"     N/A  Protocols used: CHEST INJURY - BENDING, LIFTING, OR TWISTING-A-AH

## 2019-03-15 NOTE — Telephone Encounter (Signed)
Patient screened for COVID today and advised towear mask scheduled with Lauren for 10:20 today.

## 2019-03-15 NOTE — Progress Notes (Signed)
Subjective:    Patient ID: Christopher Orr, male    DOB: [birth year], 73 y.o.   MRN: [id number]  HPI  Patient presents to clinic due to right side rib cage pain after a fall 2 days ago and hitting right-sided rib cage on edge of table.  Patient states he fell due to tripping and did not fall due to passing out feeling lightheaded.  Denies hitting head or hurting any other part of body; no LOC.  States right-sided rib cage is tender to touch and is tender when he takes a good deep breath.  He did take a BC powder x1 and this did help reduce pain.  Denies feeling short of breath or any wheezing, patient does have COPD however his breathing is at normal baseline.   Patient Active Problem List   Diagnosis Date Noted  . Degenerative disc disease, lumbar 11/01/2018  . Cigarette smoker 08/05/2018  . Acute respiratory failure with hypoxia ([place]) 07/06/2018  . Bilateral inguinal hernia, without obstruction or gangrene, recurrent   . Umbilical hernia without obstruction and without gangrene   . Panic attacks 06/14/2018  . Benzodiazepine dependence ([place]) 06/14/2018  . Post traumatic stress disorder (PTSD) 06/14/2018  . Episode of recurrent major depressive disorder ([place]) 06/14/2018  . Coronary artery disease involving native heart with angina pectoris ([place]) 05/31/2018  . Osteoarthritis of multiple joints 05/31/2018  . Hyperlipidemia 05/31/2018  . Chronic viral hepatitis C ([place]) 09/24/2014  . Chest pain 07/28/2014  . COPD (chronic obstructive pulmonary disease) ([place]) 07/28/2014  . Anxiety 07/28/2014  . Aspiration pneumonia ([place]) 07/28/2014  . Elevated troponin I level 07/28/2014  . Acute kidney injury ([place]) 07/28/2014  . GERD (gastroesophageal reflux disease) 07/28/2014   Social History   Tobacco Use  . Smoking status: Former Smoker    Packs/day: 1.00    Types: Cigarettes    Start date: 07/28/1962    Quit date: 10/26/2018    Years since quitting: 0.3  . Smokeless tobacco: Never Used   Substance Use Topics  . Alcohol use: Yes    Alcohol/week: 6.0 standard drinks    Types: 6 Cans of beer per week   Review of Systems  Constitutional: Negative for chills, fatigue and fever.  HENT: Negative for congestion, ear pain, sinus pain and sore throat.   Eyes: Negative.   Respiratory: Negative for cough, shortness of breath and wheezing.   Cardiovascular: Negative for chest pain, palpitations and leg swelling. +pain right side rib cage Gastrointestinal: Negative for abdominal pain, diarrhea, nausea and vomiting.  Genitourinary: Negative for dysuria, frequency and urgency.  Musculoskeletal: Negative for arthralgias and myalgias.  Skin: Negative for color change, pallor and rash.  Neurological: Negative for syncope, light-headedness and headaches.  Psychiatric/Behavioral: The patient is not nervous/anxious.       Objective:   Physical Exam Vitals signs and nursing note reviewed.  Constitutional:      General: He is not in acute distress.    Appearance: He is not ill-appearing, toxic-appearing or diaphoretic.       Comments: Location of right rib cage tenderness indicated by red mark on diagram.  Chest tenderness reproducible with palpation.  No obvious deformity of rib cage or any visible bruising.  HENT:     Head: Normocephalic and atraumatic.  Cardiovascular:     Rate and Rhythm: Normal rate and regular rhythm.     Heart sounds: Normal heart sounds.  Pulmonary:     Effort: Pulmonary effort is normal. No respiratory distress.  Breath sounds: Normal breath sounds. No wheezing, rhonchi or rales.     Comments: Breath sounds heard throughout lung fields.  Patient is able to take deep breath on command. Neurological:     General: No focal deficit present.     Mental Status: He is alert and oriented to person, place, and time.     Gait: Gait normal.  Psychiatric:        Mood and Affect: Mood normal.        Behavior: Behavior normal.     Today's Vitals   03/15/19  1027  BP: 140/80  Pulse: 69  Resp: 20  Temp: (!) 97.5 F (36.4 C)  TempSrc: Temporal  SpO2: 90%  Weight: 260 lb (117.9 kg)  Height: 6' (1.829 m)   Body mass index is 35.26 kg/m.    Assessment & Plan:    Rib cage pain/fall - suspect patient bruised ribs with fall.  Will get chest x-ray to rule out rib fracture.  Advised he can use topical rub like BenGay or Biofreeze on sore rib area and or heating pad to help reduce pain.  Also advised to use Tylenol as needed and I will send in 15 tablets of tramadol to use as needed for more moderate to severe pain.  [facility] PMP registry checked and is appropriate for prescription of tramadol.  Patient encouraged to take deep breaths at least every 2-3 hours throughout the day to help keep the lungs open.  Also recommended holding a pillow when having to cough or take a deep breath to help brace self and reduce pain.  Patient will follow-up in 2 weeks for recheck and to be sure he is improving.

## 2019-03-15 NOTE — Telephone Encounter (Signed)
FYI Patient screened for COVID today and advised towear mask scheduled with Lauren for 10:20 today.

## 2019-03-24 ENCOUNTER — Other Ambulatory Visit: Payer: Self-pay

## 2019-03-24 ENCOUNTER — Ambulatory Visit: Payer: Medicare HMO | Admitting: Cardiovascular Disease

## 2019-03-24 ENCOUNTER — Encounter: Payer: Self-pay | Admitting: Cardiovascular Disease

## 2019-03-24 VITALS — BP 110/72 | HR 65 | Ht 72.0 in | Wt 259.0 lb

## 2019-03-24 DIAGNOSIS — I2510 Atherosclerotic heart disease of native coronary artery without angina pectoris: Secondary | ICD-10-CM | POA: Diagnosis not present

## 2019-03-24 DIAGNOSIS — Z720 Tobacco use: Secondary | ICD-10-CM

## 2019-03-24 DIAGNOSIS — E785 Hyperlipidemia, unspecified: Secondary | ICD-10-CM | POA: Diagnosis not present

## 2019-03-24 NOTE — Patient Instructions (Signed)
Medication Instructions:  Your physician recommends that you continue on your current medications as directed. Please refer to the Current Medication list given to you today.  If you need a refill on your cardiac medications before your next appointment, please call your pharmacy.   Lab work: None ordered If you have labs (blood work) drawn today and your tests are completely normal, you will receive your results only by: . MyChart Message (if you have MyChart) OR . A paper copy in the mail If you have any lab test that is abnormal or we need to change your treatment, we will call you to review the results.  Testing/Procedures: None ordered  Follow-Up: At [facility], you and your health needs are our priority.  As part of our continuing mission to provide you with exceptional heart care, we have created designated Provider Care Teams.  These Care Teams include your primary Cardiologist (physician) and Advanced Practice Providers (APPs -  Physician Assistants and Nurse Practitioners) who all work together to provide you with the care you need, when you need it. You will need a follow up appointment in 6 months.  Please call our office 2 months in advance to schedule this appointment.  You may see Dr. Arida  or one of the following Advanced Practice Providers on your designated Care Team:   Christopher Berge, NP Ryan Dunn, PA-C . Jacquelyn Visser, PA-C  Any Other Special Instructions Will Be Listed Below (If Applicable). N/A   

## 2019-03-24 NOTE — Progress Notes (Signed)
Cardiology Office Note   Date:  03/24/2019   ID:  Christopher Orr, DOB 09/19/1945, MRN [id number]  PCP:  Jodelle Green, FNP  Cardiologist:   Kathlyn Sacramento, MD   Chief Complaint  Patient presents with  . Other    Referred by PCP for Hyperlipidemia and CAD. Meds reviewed verbally with patient.       History of Present Illness: Christopher Orr is a 73 y.o. male who presents to establish cardiovascular care.  He is transferring from Dr. Humphrey Rolls.  He has known history of coronary artery disease status post LAD stenting 2006 with Cypher drug-eluting stent. He has multiple medical problems including prolonged history of tobacco use, COPD, hyperlipidemia, obesity, previous excessive alcohol use and hepatitis C which was treated. No cardiac events since his stent placement in 2006.  He had a repeat cardiac catheterization in 2011 which showed patent stent with mild restenosis. He continues to smoke 1 pack/day.  He was able to quit smoking at some point with Chantix but relapsed. He had a CT scan of the abdomen in 2019 that showed no evidence of aortic aneurysm.  He has been doing well and denies any chest pain.  He has stable exertional dyspnea and he attributes that to her COPD.  He has no leg claudication.  He takes his medications regularly.   Past Medical History:  Diagnosis Date  . Alcohol abuse   . Anemia   . Arthritis   . Bipolar disorder ([place])   . BPH (benign prostatic hypertrophy) with urinary obstruction   . CAD (coronary artery disease)    a. s/p stent to LAD in 2006 at [facility], EF 60% at that time.  . Cancer [facility])    prostate  . CHF (congestive heart failure) ([place])   . Chronic pancreatitis ([place])   . COPD (chronic obstructive pulmonary disease) ([place])   . Depression   . Diverticulosis   . Drug use   . Dyspnea   . ED (erectile dysfunction)   . GERD (gastroesophageal reflux disease)   . Headache    occasional migraines  . Hepatitis C    treated  . History of lumbar  fusion   . Hyperlipidemia   . Hypertension    patient denies having high blood pressure  . Mitral regurgitation   . PTSD (post-traumatic stress disorder)   . Urge incontinence     Past Surgical History:  Procedure Laterality Date  . cateract  2013  . CORONARY STENT PLACEMENT  2006   x 1  . ESOPHAGOGASTRODUODENOSCOPY N/A 12/22/2014   Procedure: ESOPHAGOGASTRODUODENOSCOPY (EGD);  Surgeon: [name], MD;  Location: [facility] ENDOSCOPY;  Service: Endoscopy;  Laterality: N/A;  . HERNIA REPAIR  2015   left groin  . LUMBAR FUSION    . RADIOACTIVE SEED IMPLANT N/A 04/22/2016   Procedure: RADIOACTIVE SEED IMPLANT/BRACHYTHERAPY IMPLANT;  Surgeon: Hollice Espy, MD;  Location: [facility] ORS;  Service: Urology;  Laterality: N/A;  . ROBOT ASSISTED INGUINAL HERNIA REPAIR Bilateral 07/06/2018   Procedure: ROBOT ASSISTED INGUINAL HERNIA REPAIR;  Surgeon: Jules Husbands, MD;  Location: [facility] ORS;  Service: General;  Laterality: Bilateral;  . TONSILLECTOMY    . UMBILICAL HERNIA REPAIR N/A 07/06/2018   Procedure: LAPAROSCOPIC ROBOT ASSISTED UMBILICAL HERNIA;  Surgeon: Jules Husbands, MD;  Location: [facility] ORS;  Service: General;  Laterality: N/A;     Current Outpatient Medications  Medication Sig Dispense Refill  . albuterol (PROVENTIL) (2.5 MG/3ML) 0.083% nebulizer solution Take 3 mLs (  2.5 mg total) by nebulization every 4 (four) hours as needed for wheezing or shortness of breath. 75 mL 3  . alprazolam (XANAX) 2 MG tablet Take 1 tablet (2 mg total) by mouth at bedtime as needed for sleep. 30 tablet 0  . aspirin 81 MG tablet Take 81 mg by mouth daily. Reported on 01/15/2016    . budesonide-formoterol (SYMBICORT) 160-4.5 MCG/ACT inhaler Inhale 2 puffs into the lungs 2 (two) times daily. 1 Inhaler 3  . celecoxib (CELEBREX) 200 MG capsule TAKE 1 CAPSULE (200 MG TOTAL) BY MOUTH DAILY AFTER BREAKFAST. 90 capsule 1  . clopidogrel (PLAVIX) 75 MG tablet Take 1 tablet (75 mg total) by mouth daily. Reported on  01/15/2016 90 tablet 3  . donepezil (ARICEPT) 5 MG tablet Take 1 tablet (5 mg total) by mouth at bedtime. 30 tablet 1  . FLUoxetine (PROZAC) 20 MG capsule Take 60 mg by mouth daily.  1  . folic acid (FOLVITE) 1 MG tablet Take 1 tablet (1 mg total) by mouth daily. 30 tablet 2  . methylPREDNISolone (MEDROL DOSEPAK) 4 MG TBPK tablet Take according to pack instructions 21 tablet 0  . metoprolol succinate (TOPROL-XL) 25 MG 24 hr tablet Take 1 tablet (25 mg total) by mouth daily. 90 tablet 3  . Multiple Vitamins-Minerals (MULTIVITAMIN WITH MINERALS) tablet Take 1 tablet by mouth daily.    Marland Kitchen MYRBETRIQ 50 MG TB24 tablet TAKE 1 TABLET BY MOUTH EVERY DAY 90 tablet 0  . omega-3 fish oil (MAXEPA) 1000 MG CAPS capsule Take 1 capsule by mouth daily.     . pantoprazole (PROTONIX) 40 MG tablet Take 1 tablet (40 mg total) by mouth daily. 90 tablet 3  . rosuvastatin (CRESTOR) 10 MG tablet Take 1 tablet (10 mg total) by mouth daily. 90 tablet 3  . senna-docusate (SENOKOT-S) 8.6-50 MG tablet Take 1 tablet by mouth at bedtime. 30 tablet 0  . tamsulosin (FLOMAX) 0.4 MG CAPS capsule Take 1 capsule (0.4 mg total) by mouth daily after supper. 90 capsule 3  . traZODone (DESYREL) 150 MG tablet Take 1 tablet (150 mg total) by mouth at bedtime. 30 tablet 1  . varenicline (CHANTIX STARTING MONTH PAK) 0.5 MG X 11 & 1 MG X 42 tablet Take one 0.5 mg tablet by mouth once daily for 3 days, then increase to one 0.5 mg tablet twice daily for 4 days, then increase to one 1 mg tablet twice daily. Starting month pack. 53 tablet 0  . varenicline (CHANTIX) 1 MG tablet Take 1 tablet (1 mg total) by mouth 2 (two) times daily. 60 tablet 1   Current Facility-Administered Medications  Medication Dose Route Frequency Provider Last Rate Last Dose  . albuterol (PROVENTIL) (2.5 MG/3ML) 0.083% nebulizer solution 2.5 mg  2.5 mg Nebulization Once Guse, Jacquelynn Cree, FNP        Allergies:   Patient has no known allergies.    Social History:  The  patient  reports that he quit smoking about 4 months ago. His smoking use included cigarettes. He started smoking about 56 years ago. He smoked 1.00 pack per day. He has never used smokeless tobacco. He reports current alcohol use of about 6.0 standard drinks of alcohol per week. He reports previous drug use. Drug: Marijuana.   Family History:  The patient's family history includes Alcohol abuse in his sister; Anxiety disorder in his sister; Colon cancer in his father; Depression in his sister; Drug abuse in his sister; Heart disease in his father.  ROS:  Please see the history of present illness.   Otherwise, review of systems are positive for none.   All other systems are reviewed and negative.    PHYSICAL EXAM: VS:  BP 110/72 (BP Location: Right Arm, Patient Position: Sitting, Cuff Size: Normal)   Pulse 65   Ht 6' (1.829 m)   Wt 259 lb (117.5 kg)   BMI 35.13 kg/m  , BMI Body mass index is 35.13 kg/m. GEN: Well nourished, well developed, in no acute distress  HEENT: normal  Neck: no JVD, carotid bruits, or masses Cardiac: RRR with premature beats; no murmurs, rubs, or gallops,no edema.  Distal pulses are palpable Respiratory:  clear to auscultation bilaterally, normal work of breathing GI: soft, nontender, nondistended, + BS MS: no deformity or atrophy  Skin: warm and dry, no rash Neuro:  Strength and sensation are intact Psych: euthymic mood, full affect   EKG:  EKG is ordered today. The ekg ordered today demonstrates sinus rhythm with 1 PVC and left anterior fascicular block.   Recent Labs: 07/06/2018: Magnesium 2.0 07/08/2018: ALT 12; BUN 18; Creatinine, Ser 1.20; Hemoglobin 12.4; Platelets 228.0; Potassium 4.6; Sodium 136    Lipid Panel    Component Value Date/Time   CHOL 95 05/31/2018 1428   TRIG 113.0 05/31/2018 1428   HDL 44.50 05/31/2018 1428   CHOLHDL 2 05/31/2018 1428   VLDL 22.6 05/31/2018 1428   LDLCALC 28 05/31/2018 1428      Wt Readings from Last 3  Encounters:  03/24/19 259 lb (117.5 kg)  03/15/19 260 lb (117.9 kg)  02/01/19 254 lb 12.8 oz (115.6 kg)       PAD Screen 03/24/2019  Previous PAD dx? No  Previous surgical procedure? No  Pain with walking? No  Feet/toe relief with dangling? No  Painful, non-healing ulcers? No  Extremities discolored? No      ASSESSMENT AND PLAN:  1.  Coronary artery disease involving native coronary arteries without angina: He is doing well overall with no anginal symptoms.  Continue medical therapy.  Given that he has a first generation drug-eluting stent, I favor continued dual antiplatelet therapy as tolerated.  2.  Hyperlipidemia: Continue treatment with rosuvastatin.  Most recent lipid profile showed an LDL of 28.  3.  Tobacco use: I discussed with him the importance of smoking cessation but he reports inability to quit and he has tried multiple times in the past.    Disposition:   FU with me in 6 months  Signed,  Kathlyn Sacramento, MD  03/24/2019 8:45 AM    [place]

## 2019-03-28 ENCOUNTER — Ambulatory Visit (HOSPITAL_COMMUNITY): Payer: Medicare HMO

## 2019-03-29 ENCOUNTER — Ambulatory Visit: Payer: Medicare HMO | Admitting: Family Medicine

## 2019-03-29 DIAGNOSIS — Z0289 Encounter for other administrative examinations: Secondary | ICD-10-CM

## 2019-03-31 ENCOUNTER — Other Ambulatory Visit: Payer: Self-pay

## 2019-03-31 ENCOUNTER — Ambulatory Visit (HOSPITAL_COMMUNITY)
Admission: RE | Admit: 2019-03-31 | Discharge: 2019-03-31 | Disposition: A | Discharge status: Home / Self Care | Payer: Medicare HMO | Source: Ambulatory Visit | Attending: Neurology | Admitting: Neurology

## 2019-03-31 ENCOUNTER — Ambulatory Visit: Admission: RE | Admit: 2019-03-31 | Payer: Medicare HMO | Source: Ambulatory Visit

## 2019-03-31 DIAGNOSIS — G3184 Mild cognitive impairment, so stated: Secondary | ICD-10-CM

## 2019-04-06 ENCOUNTER — Ambulatory Visit (INDEPENDENT_AMBULATORY_CARE_PROVIDER_SITE_OTHER): Payer: Medicare HMO

## 2019-04-06 ENCOUNTER — Other Ambulatory Visit: Payer: Self-pay

## 2019-04-06 DIAGNOSIS — Z0000 Encounter for general adult medical examination without abnormal findings: Secondary | ICD-10-CM

## 2019-04-06 NOTE — Patient Instructions (Addendum)
  Christopher Orr , Thank you for taking time to come for your Medicare Wellness Visit. I appreciate your ongoing commitment to your health goals. Please review the following plan we discussed and let me know if I can assist you in the future.   These are the goals we discussed: Goals      Patient Stated   . Advanced Care Planning (pt-stated)     To be completed.        This is a list of the screening recommended for you and due dates:  Health Maintenance  Topic Date Due  . Tetanus Vaccine  08/12/1965  . Colon Cancer Screening  08/12/1996  . Pneumonia vaccines (1 of 2 - PCV13) 08/13/2011  . Flu Shot  10/26/2019*  .  Hepatitis C: One time screening is recommended by Center for Disease Control  (CDC) for  adults born from [birth year] through 1964.   Completed  *Topic was postponed. The date shown is not the original due date.

## 2019-04-06 NOTE — Progress Notes (Signed)
Subjective:   Christopher Orr is a 73 y.o. male who presents for an Initial Medicare Annual Wellness Visit.  Review of Systems  No ROS.  Medicare Wellness Virtual Visit.  Visual/audio telehealth visit, UTA vital signs.   See social history for additional risk factors.   Cardiac Risk Factors include: male gender;advanced age (>76men, >70 women);smoking/ tobacco exposure    Objective:    Today's Vitals   There is no height or weight on file to calculate BMI.  Advanced Directives 04/06/2019 07/06/2018 07/01/2018 05/17/2018 04/28/2018 04/30/2017 04/09/2017  Does Patient Have a Medical Advance Directive? No No No No No No No  Would patient like information on creating a medical advance directive? Yes (MAU/Ambulatory/Procedural Areas - Information given) No - Patient declined - Yes (ED - Information included in AVS) No - Patient declined - No - Patient declined    Current Medications (verified) Outpatient Encounter Medications as of 04/06/2019  Medication Sig   albuterol (PROVENTIL) (2.5 MG/3ML) 0.083% nebulizer solution Take 3 mLs (2.5 mg total) by nebulization every 4 (four) hours as needed for wheezing or shortness of breath.   alprazolam (XANAX) 2 MG tablet Take 1 tablet (2 mg total) by mouth at bedtime as needed for sleep.   aspirin 81 MG tablet Take 81 mg by mouth daily. Reported on 01/15/2016   budesonide-formoterol (SYMBICORT) 160-4.5 MCG/ACT inhaler Inhale 2 puffs into the lungs 2 (two) times daily.   celecoxib (CELEBREX) 200 MG capsule TAKE 1 CAPSULE (200 MG TOTAL) BY MOUTH DAILY AFTER BREAKFAST.   clopidogrel (PLAVIX) 75 MG tablet Take 1 tablet (75 mg total) by mouth daily. Reported on 01/15/2016   donepezil (ARICEPT) 5 MG tablet Take 1 tablet (5 mg total) by mouth at bedtime.   FLUoxetine (PROZAC) 20 MG capsule Take 60 mg by mouth daily.   folic acid (FOLVITE) 1 MG tablet Take 1 tablet (1 mg total) by mouth daily.   metoprolol succinate (TOPROL-XL) 25 MG 24 hr tablet Take  1 tablet (25 mg total) by mouth daily.   Multiple Vitamins-Minerals (MULTIVITAMIN WITH MINERALS) tablet Take 1 tablet by mouth daily.   MYRBETRIQ 50 MG TB24 tablet TAKE 1 TABLET BY MOUTH EVERY DAY   omega-3 fish oil (MAXEPA) 1000 MG CAPS capsule Take 1 capsule by mouth daily.    pantoprazole (PROTONIX) 40 MG tablet Take 1 tablet (40 mg total) by mouth daily.   rosuvastatin (CRESTOR) 10 MG tablet Take 1 tablet (10 mg total) by mouth daily.   senna-docusate (SENOKOT-S) 8.6-50 MG tablet Take 1 tablet by mouth at bedtime.   tamsulosin (FLOMAX) 0.4 MG CAPS capsule Take 1 capsule (0.4 mg total) by mouth daily after supper.   traZODone (DESYREL) 150 MG tablet Take 1 tablet (150 mg total) by mouth at bedtime.   varenicline (CHANTIX STARTING MONTH PAK) 0.5 MG X 11 & 1 MG X 42 tablet Take one 0.5 mg tablet by mouth once daily for 3 days, then increase to one 0.5 mg tablet twice daily for 4 days, then increase to one 1 mg tablet twice daily. Starting month pack. (Patient not taking: Reported on 04/06/2019)   varenicline (CHANTIX) 1 MG tablet Take 1 tablet (1 mg total) by mouth 2 (two) times daily. (Patient not taking: Reported on 04/06/2019)   [DISCONTINUED] methylPREDNISolone (MEDROL DOSEPAK) 4 MG TBPK tablet Take according to pack instructions   Facility-Administered Encounter Medications as of 04/06/2019  Medication   albuterol (PROVENTIL) (2.5 MG/3ML) 0.083% nebulizer solution 2.5 mg    Allergies (  verified) Patient has no known allergies.   History: Past Medical History:  Diagnosis Date   Alcohol abuse    Anemia    Arthritis    Bipolar disorder (HCC)    BPH (benign prostatic hypertrophy) with urinary obstruction    CAD (coronary artery disease)    a. s/p stent to LAD in 2006 at [facility], EF 60% at that time.   Cancer [facility])    prostate   CHF (congestive heart failure) (HCC)    Chronic pancreatitis (HCC)    COPD (chronic obstructive pulmonary disease) (HCC)    Depression     Diverticulosis    Drug use    Dyspnea    ED (erectile dysfunction)    GERD (gastroesophageal reflux disease)    Headache    occasional migraines   Hepatitis C    treated   History of lumbar fusion    Hyperlipidemia    Hypertension    patient denies having high blood pressure   Mitral regurgitation    PTSD (post-traumatic stress disorder)    Urge incontinence    Past Surgical History:  Procedure Laterality Date   cateract  2013   CORONARY STENT PLACEMENT  2006   x 1   ESOPHAGOGASTRODUODENOSCOPY N/A 12/22/2014   Procedure: ESOPHAGOGASTRODUODENOSCOPY (EGD);  Surgeon: [name], MD;  Location: [facility] ENDOSCOPY;  Service: Endoscopy;  Laterality: N/A;   HERNIA REPAIR  2015   left groin   LUMBAR FUSION     RADIOACTIVE SEED IMPLANT N/A 04/22/2016   Procedure: RADIOACTIVE SEED IMPLANT/BRACHYTHERAPY IMPLANT;  Surgeon: Hollice Espy, MD;  Location: [facility] ORS;  Service: Urology;  Laterality: N/A;   ROBOT ASSISTED INGUINAL HERNIA REPAIR Bilateral 07/06/2018   Procedure: ROBOT ASSISTED INGUINAL HERNIA REPAIR;  Surgeon: Jules Husbands, MD;  Location: [facility] ORS;  Service: General;  Laterality: Bilateral;   TONSILLECTOMY     UMBILICAL HERNIA REPAIR N/A 07/06/2018   Procedure: LAPAROSCOPIC ROBOT ASSISTED UMBILICAL HERNIA;  Surgeon: Jules Husbands, MD;  Location: [facility] ORS;  Service: General;  Laterality: N/A;   Family History  Problem Relation Age of Onset   Heart disease Father        Unclear details. Father passed in late 25's 2/2 cancer.   Colon cancer Father    Alcohol abuse Sister    Drug abuse Sister    Anxiety disorder Sister    Depression Sister    Kidney disease Neg Hx    Prostate cancer Neg Hx    Social History   Socioeconomic History   Marital status: Divorced    Spouse name: Not on file   Number of children: 4   Years of education: Not on file   Highest education level: GED or equivalent  Occupational History   Occupation: Engineer, drilling    Comment: retired  Scientist, product/process development strain: Not very hard   Food insecurity    Worry: Never true    Inability: Often true   Transportation needs    Medical: Yes    Non-medical: Yes  Tobacco Use   Smoking status: Current Every Day Smoker    Packs/day: 1.00    Types: Cigarettes    Start date: 07/28/1962    Last attempt to quit: 10/26/2018    Years since quitting: 0.4   Smokeless tobacco: Never Used  Substance and Sexual Activity   Alcohol use: Yes    Alcohol/week: 6.0 standard drinks    Types: 6 Cans of beer per week  Drug use: Not Currently    Types: Marijuana    Comment: Endorsed heroin 06/2014, UDS also + benzos, opiates, THC, neg for cocaine at that time.   Sexual activity: Not Currently  Lifestyle   Physical activity    Days per week: 0 days    Minutes per session: 0 min   Stress: Very much  Relationships   Social connections    Talks on phone: More than three times a week    Gets together: Once a week    Attends religious service: Never    Active member of club or organization: Yes    Attends meetings of clubs or organizations: More than 4 times per year    Relationship status: Divorced  Other Topics Concern   Not on file  Social History Narrative   Not on file   Tobacco Counseling Ready to quit: Not Answered Counseling given: Not Answered   Clinical Intake:  Pre-visit preparation completed: Yes        Diabetes: No  How often do you need to have someone help you when you read instructions, pamphlets, or other written materials from your doctor or pharmacy?: 1 - Never  Interpreter Needed?: No     Activities of Daily Living In your present state of health, do you have any difficulty performing the following activities: 04/06/2019 07/06/2018  Hearing? N -  Vision? N -  Difficulty concentrating or making decisions? Y -  Walking or climbing stairs? Y -  Dressing or bathing? N -  Doing errands, shopping?  N N  Preparing Food and eating ? N -  Using the Toilet? N -  In the past six months, have you accidently leaked urine? N -  Do you have problems with loss of bowel control? N -  Managing your Medications? N -  Managing your Finances? N -  Housekeeping or managing your Housekeeping? N -  Some recent data might be hidden     Immunizations and Health Maintenance Immunization History  Administered Date(s) Administered   Influenza,inj,Quad PF,6+ Mos 05/04/2018   Health Maintenance Due  Topic Date Due   TETANUS/TDAP  08/12/1965   COLONOSCOPY  08/12/1996   PNA vac Low Risk Adult (1 of 2 - PCV13) 08/13/2011    Patient Care Team: Jodelle Green, FNP as PCP - General (Family Medicine)  Indicate any recent Medical Services you may have received from other than [facility] providers in the past year (date may be approximate).    Assessment:   This is a routine wellness examination for [place].  I connected with patient 04/06/19 at 11:30 AM EDT by an audio enabled telemedicine application and verified that I am speaking with the correct person using two identifiers. Patient stated full name and DOB. Patient gave permission to continue with virtual visit. Patient's location was at home and Nurse's location was at [place] office.   Health Maintenance Due: Influenza vaccine 2020- discussed; to be completed in season with doctor or local pharmacy.   PNA - discussed; to be completed with doctor in visit or local pharmacy.  Tdap- discussed; to be completed with doctor in visit or local pharmacy. Colonoscopy- deferred; he plans to follow up with primary care provider.   Update all pending maintenance due as appropriate.   See completed HM at the end of note.   Eye: Visual acuity not assessed. Virtual visit. Wears corrective lenses.   Dental: Visits every 6 months.    Hearing: Demonstrates normal hearing during visit.  Safety:  Patient feels safe at home- yes Patient does have smoke  detectors at home- yes Patient does wear sunscreen or protective clothing when in direct sunlight - yes Patient does wear seat belt when in a moving vehicle - yes Patient drives- yes Adequate lighting in walkways free from debris- yes Grab bars and handrails used as appropriate- yes Ambulates with no assistive device Cell phone on person when ambulating outside of the home- yes  Social: Alcohol intake - yes      Smoking history- current  Depression: PHQ 2 &9 complete. See screening below. Denies irritability, anhedonia, sadness/tearfullness.  Stable.   Falls: See screening below.    Medication: Taking as directed and without issues.   Covid-19: Precautions and sickness symptoms discussed. Wears mask, social distancing, hand hygiene as appropriate.   Activities of Daily Living Patient denies needing assistance with: household chores, feeding themselves, getting from bed to chair, getting to the toilet, bathing/showering, dressing, managing money, or preparing meals.  He paces himself.   Memory: Patient is alert. MRI complete. Correctly identified the president of the [place], season and recall. Scheduled office visit with Neurology (Dr. Manuella Ghazi).  BMI- discussed the importance of a healthy diet, water intake and the benefits of aerobic exercise.  Educational material provided.  Physical activity- no routine. Walking or chair exercises encouraged.   Diet: Regular Water: good intake Caffeine: 10-12 cups daily  Advanced Directive: End of life planning; Advance aging; Advanced directives discussed.  Copy of current HCPOA/Living Will requested upon completion.    Other Providers Patient Care Team: Jodelle Green, FNP as PCP - General (Family Medicine)  Hearing/Vision screen  Hearing Screening   125Hz  250Hz  500Hz  1000Hz  2000Hz  3000Hz  4000Hz  6000Hz  8000Hz   Right ear:           Left ear:           Comments: Patient is able to hear conversational tones without difficulty.  No  issues reported.   Vision Screening Comments: Wears corrective lenses Visual acuity not assessed, virtual visit.      Dietary issues and exercise activities discussed: Current Exercise Habits: The patient does not participate in regular exercise at present  Goals      Patient [place] (pt-stated)     To be completed.       Depression Screen PHQ 2/9 Scores 04/06/2019 04/09/2017 10/09/2016  PHQ - 2 Score 0 0 0    Fall Risk Fall Risk  04/06/2019 06/02/2018 04/09/2017 10/09/2016  Falls in the past year? 0 0 No No   Timed Get Up and Go performed: no, virtual visit  Cognitive Function:     6CIT Screen 04/06/2019  What Year? 0 points  What month? 0 points  What time? 0 points  Count back from 20 0 points  Months in reverse 0 points  Repeat phrase 0 points  Total Score 0    Screening Tests Health Maintenance  Topic Date Due   TETANUS/TDAP  08/12/1965   COLONOSCOPY  08/12/1996   PNA vac Low Risk Adult (1 of 2 - PCV13) 08/13/2011   INFLUENZA VACCINE  10/26/2019 (Originally 02/26/2019)   Hepatitis C Screening  Completed      Plan:    Keep all routine maintenance appointments.   Reschedule with pcp as needed. Notes ribs are healing well.   Medicare Attestation I have personally reviewed: The patient's medical and social history Their use of alcohol, tobacco or illicit drugs Their current medications and supplements The  patient's functional ability including ADLs,fall risks, home safety risks, cognitive, and hearing and visual impairment Diet and physical activities Evidence for depression   In addition, I have reviewed and discussed with patient certain preventive protocols, quality metrics, and best practice recommendations. A written personalized care plan for preventive services as well as general preventive health recommendations were provided to patient via mail.     Varney Biles, LPN   03/03/1957

## 2019-04-13 ENCOUNTER — Other Ambulatory Visit: Payer: Self-pay | Admitting: Family Medicine

## 2019-04-13 DIAGNOSIS — D619 Aplastic anemia, unspecified: Secondary | ICD-10-CM

## 2019-04-20 DIAGNOSIS — F0280 Dementia in other diseases classified elsewhere without behavioral disturbance: Secondary | ICD-10-CM | POA: Diagnosis not present

## 2019-04-20 DIAGNOSIS — G301 Alzheimer's disease with late onset: Secondary | ICD-10-CM | POA: Diagnosis not present

## 2019-04-20 DIAGNOSIS — F329 Major depressive disorder, single episode, unspecified: Secondary | ICD-10-CM | POA: Diagnosis not present

## 2019-04-20 DIAGNOSIS — F419 Anxiety disorder, unspecified: Secondary | ICD-10-CM | POA: Diagnosis not present

## 2019-04-28 ENCOUNTER — Other Ambulatory Visit: Payer: Self-pay | Admitting: Urology

## 2019-05-10 ENCOUNTER — Other Ambulatory Visit: Payer: Self-pay

## 2019-05-10 ENCOUNTER — Encounter: Payer: Self-pay | Admitting: Urology

## 2019-05-10 ENCOUNTER — Ambulatory Visit (INDEPENDENT_AMBULATORY_CARE_PROVIDER_SITE_OTHER): Payer: Medicare HMO | Admitting: Urology

## 2019-05-10 VITALS — BP 138/76 | HR 68 | Ht 72.0 in | Wt 261.0 lb

## 2019-05-10 DIAGNOSIS — C61 Malignant neoplasm of prostate: Secondary | ICD-10-CM

## 2019-05-10 DIAGNOSIS — R351 Nocturia: Secondary | ICD-10-CM | POA: Diagnosis not present

## 2019-05-10 LAB — URINALYSIS, COMPLETE
Bilirubin, UA: NEGATIVE
Glucose, UA: NEGATIVE
Ketones, UA: NEGATIVE
Leukocytes,UA: NEGATIVE
Nitrite, UA: NEGATIVE
Protein,UA: NEGATIVE
Specific Gravity, UA: 1.02 (ref 1.005–1.030)
Urobilinogen, Ur: 0.2 mg/dL (ref 0.2–1.0)
pH, UA: 5.5 (ref 5.0–7.5)

## 2019-05-10 LAB — MICROSCOPIC EXAMINATION
Bacteria, UA: NONE SEEN
Epithelial Cells (non renal): NONE SEEN /hpf (ref 0–10)
WBC, UA: NONE SEEN /hpf (ref 0–5)

## 2019-05-10 LAB — BLADDER SCAN AMB NON-IMAGING

## 2019-05-10 NOTE — Progress Notes (Signed)
05/10/2019 12:51 PM   Christopher Orr, Christopher Orr [id number]  Referring provider: Jodi Marble, MD [place],  [place] [address]  Chief Complaint  Patient presents with  . Prostate Cancer    HPI: 73 yo M with multiple GU issues who returns today for routine follow-up.  History of prostate cancer He was dx with low risk Gleason 3+3 prostate cancer s/p brachytherapy seed implant 04/22/16 with I-125 seeds.   He was dx after rise in his PSA up to 3.8 on 12/20/15 up from 3.2 on 09/24/15, and 2.2 the previous year s/p prostate biopsy on 01/15/16 demonstrating prostate cancer, T1c, Gleason 3+3 involving 7/12 cores including all cores on left and single core at right apex up to 70% tissue. TRUS volume 42 cc.  Most recent PSA 0.3 on 11/2018, repeat pending today.  BPH with LUTS/ urinary frequency/ nocturia He does also have baseline lower urinary tract symptoms including urinary urgency and urge incontinence. He is currently on Mybetriq 50 mg and flomax 0.4 mg.   Today, he icomplains of worsenting of night time voiding symptoms.  He is getting up now up to 4-6x nightly which is disrubptive.  He does endorse that he is drinking more EtOHm, 4-6 beers at night.  He has no daytime symptoms.  No gross hematuria or dysuria.  No UTIs.     ED Baseline severe ED, unable to achieve or maintain an erection. Not discussed today.   IPSS    Row Name 05/10/19 1100         International Prostate Symptom Score   How often have you had the sensation of not emptying your bladder?  Less than half the time     How often have you had to urinate less than every two hours?  More than half the time     How often have you found you stopped and started again several times when you urinated?  Not at All     How often have you found it difficult to postpone urination?  More than half the time     How often have you had a weak urinary stream?  Less than half the time     How often  have you had to strain to start urination?  Not at All     How many times did you typically get up at night to urinate?  4 Times     Total IPSS Score  16       Quality of Life due to urinary symptoms   If you were to spend the rest of your life with your urinary condition just the way it is now how would you feel about that?  Mostly Disatisfied        Score:  1-7 Mild 8-19 Moderate 20-35 Severe   PMH: Past Medical History:  Diagnosis Date  . Alcohol abuse   . Anemia   . Arthritis   . Bipolar disorder ([place])   . BPH (benign prostatic hypertrophy) with urinary obstruction   . CAD (coronary artery disease)    a. s/p stent to LAD in 2006 at [facility], EF 60% at that time.  . Cancer [facility])    prostate  . CHF (congestive heart failure) ([place])   . Chronic pancreatitis ([place])   . COPD (chronic obstructive pulmonary disease) ([place])   . Depression   . Diverticulosis   . Drug use   . Dyspnea   . ED (erectile dysfunction)   . GERD (gastroesophageal  reflux disease)   . Headache    occasional migraines  . Hepatitis C    treated  . History of lumbar fusion   . Hyperlipidemia   . Hypertension    patient denies having high blood pressure  . Mitral regurgitation   . PTSD (post-traumatic stress disorder)   . Urge incontinence     Surgical History: Past Surgical History:  Procedure Laterality Date  . cateract  2013  . CORONARY STENT PLACEMENT  2006   x 1  . ESOPHAGOGASTRODUODENOSCOPY N/A 12/22/2014   Procedure: ESOPHAGOGASTRODUODENOSCOPY (EGD);  Surgeon: [name], MD;  [facility] ENDOSCOPY;  Service: Endoscopy;  Laterality: N/A;  . HERNIA REPAIR  2015   left groin  . LUMBAR FUSION    . RADIOACTIVE SEED IMPLANT N/A 04/22/2016   Procedure: RADIOACTIVE SEED IMPLANT/BRACHYTHERAPY IMPLANT;  Surgeon: Hollice Espy, MD;  Location: [facility] ORS;  Service: Urology;  Laterality: N/A;  . ROBOT ASSISTED INGUINAL HERNIA REPAIR Bilateral 07/06/2018   Procedure: ROBOT ASSISTED INGUINAL  HERNIA REPAIR;  Surgeon: Jules Husbands, MD;  Location: [facility] ORS;  Service: General;  Laterality: Bilateral;  . TONSILLECTOMY    . UMBILICAL HERNIA REPAIR N/A 07/06/2018   Procedure: LAPAROSCOPIC ROBOT ASSISTED UMBILICAL HERNIA;  Surgeon: Jules Husbands, MD;  Location: [facility] ORS;  Service: General;  Laterality: N/A;    Home Medications:  Allergies as of 05/10/2019   No Known Allergies     Medication List       Accurate as of May 10, 2019 12:51 PM. If you have any questions, ask your nurse or doctor.        albuterol (2.5 MG/3ML) 0.083% nebulizer solution Commonly known as: PROVENTIL Take 3 mLs (2.5 mg total) by nebulization every 4 (four) hours as needed for wheezing or shortness of breath.   alprazolam 2 MG tablet Commonly known as: XANAX Take 1 tablet (2 mg total) by mouth at bedtime as needed for sleep.   aspirin 81 MG tablet Take 81 mg by mouth daily. Reported on 01/15/2016   budesonide-formoterol 160-4.5 MCG/ACT inhaler Commonly known as: SYMBICORT Inhale 2 puffs into the lungs 2 (two) times daily.   celecoxib 200 MG capsule Commonly known as: CELEBREX TAKE 1 CAPSULE (200 MG TOTAL) BY MOUTH DAILY AFTER BREAKFAST.   clopidogrel 75 MG tablet Commonly known as: PLAVIX Take 1 tablet (75 mg total) by mouth daily. Reported on 01/15/2016   donepezil 5 MG tablet Commonly known as: Aricept Take 1 tablet (5 mg total) by mouth at bedtime.   FLUoxetine 20 MG capsule Commonly known as: PROZAC Take 60 mg by mouth daily.   folic acid 1 MG tablet Commonly known as: FOLVITE TAKE 1 TABLET BY MOUTH EVERY DAY   metoprolol succinate 25 MG 24 hr tablet Commonly known as: TOPROL-XL Take 1 tablet (25 mg total) by mouth daily.   multivitamin with minerals tablet Take 1 tablet by mouth daily.   Myrbetriq 50 MG Tb24 tablet Generic drug: mirabegron ER TAKE 1 TABLET BY MOUTH EVERY DAY   omega-3 fish oil 1000 MG Caps capsule Commonly known as: MAXEPA Take 1 capsule by mouth  daily.   pantoprazole 40 MG tablet Commonly known as: PROTONIX Take 1 tablet (40 mg total) by mouth daily.   rosuvastatin 10 MG tablet Commonly known as: CRESTOR Take 1 tablet (10 mg total) by mouth daily.   senna-docusate 8.6-50 MG tablet Commonly known as: Senokot-S Take 1 tablet by mouth at bedtime.   tamsulosin 0.4 MG Caps capsule  Commonly known as: FLOMAX Take 1 capsule (0.4 mg total) by mouth daily after supper.   traZODone 150 MG tablet Commonly known as: DESYREL Take 1 tablet (150 mg total) by mouth at bedtime.   varenicline 0.5 MG X 11 & 1 MG X 42 tablet Commonly known as: Chantix Starting Month Pak Take one 0.5 mg tablet by mouth once daily for 3 days, then increase to one 0.5 mg tablet twice daily for 4 days, then increase to one 1 mg tablet twice daily. Starting month pack.   varenicline 1 MG tablet Commonly known as: Chantix Take 1 tablet (1 mg total) by mouth 2 (two) times daily.       Allergies: No Known Allergies  Family History: Family History  Problem Relation Age of Onset  . Heart disease Father        Unclear details. Father passed in late 64's 2/2 cancer.  . Colon cancer Father   . Alcohol abuse Sister   . Drug abuse Sister   . Anxiety disorder Sister   . Depression Sister   . Kidney disease Neg Hx   . Prostate cancer Neg Hx     Social History:  reports that he has been smoking cigarettes. He started smoking about 56 years ago. He has been smoking about 1.00 pack per day. He has never used smokeless tobacco. He reports current alcohol use of about 6.0 standard drinks of alcohol per week. He reports previous drug use. Drug: Marijuana.  ROS: UROLOGY Frequent Urination?: No Hard to postpone urination?: No Burning/pain with urination?: No Get up at night to urinate?: No Leakage of urine?: Yes Urine stream starts and stops?: No Trouble starting stream?: No Do you have to strain to urinate?: No Blood in urine?: No Urinary tract infection?:  No Sexually transmitted disease?: No Injury to kidneys or bladder?: No Painful intercourse?: No Weak stream?: No Erection problems?: Yes Penile pain?: No  Gastrointestinal Nausea?: No Vomiting?: No Indigestion/heartburn?: No Diarrhea?: No Constipation?: No  Constitutional Fever: No Night sweats?: No Weight loss?: No Fatigue?: No  Skin Skin rash/lesions?: No Itching?: No  Eyes Blurred vision?: No Double vision?: No  Ears/Nose/Throat Sore throat?: No Sinus problems?: No  Hematologic/Lymphatic Swollen glands?: No Easy bruising?: No  Cardiovascular Leg swelling?: No Chest pain?: No  Respiratory Cough?: No Shortness of breath?: Yes  Endocrine Excessive thirst?: No  Musculoskeletal Back pain?: No Joint pain?: No  Neurological Headaches?: No Dizziness?: No  Psychologic Depression?: Yes Anxiety?: No  Physical Exam: BP 138/76   Pulse 68   Ht 6' (1.829 m)   Wt 261 lb (118.4 kg)   BMI 35.40 kg/m   Constitutional:  Alert and oriented, No acute distress. HEENT: [place] AT, moist mucus membranes.  Trachea midline, no masses. Cardiovascular: No clubbing, cyanosis, or edema. Respiratory: Normal respiratory effort, no increased work of breathing. Skin: No rashes, bruises or suspicious lesions. Neurologic: Grossly intact, no focal deficits, moving all 4 extremities. Psychiatric: Normal mood and affect.  Laboratory Data: Lab Results  Component Value Date   WBC 10.9 (H) 07/08/2018   HGB 12.4 (L) 07/08/2018   HCT 36.9 (L) 07/08/2018   MCV 97.5 07/08/2018   PLT 228.0 07/08/2018    Lab Results  Component Value Date   CREATININE 1.20 07/08/2018    Urinalysis Urinalysis reviewed today, 1+ blood on dip however no red blood cells per high-power field.  Otherwise unremarkable.  Pertinent Imaging: Results for orders placed or performed in visit on 05/10/19  Bladder Scan (Post  Void Residual) in office  Result Value Ref Range   Scan Result 29ml      Assessment & Plan:    1. Prostate cancer [facility])  - Bladder Scan (Post Void Residual) in office - PSA  2. Nocturia Currently on Flomax as well as Myrbetriq 50 mg which is previously controlling his urinary symptoms  His primary complaint today is nocturia x4-6 but does endorse drinking 4-6 beers prior to bed which is a huge behavioral component to his nighttime symptoms.  We discussed behavioral modification today at length.  He reports that he drinks this much at nighttime because he is bored.  We discussed coming up with alternatives to drinking alcoholic beverages this is likely affecting his mood, liver, memory issues, and overall health.  We discussed that until he changes his behavior, pursuing other treatment options for nighttime urinary symptoms will likely not be effective.  He will commit to working on this. - Urinalysis, Complete   Return in about 6 months (around 11/08/2019) for IPSS/ PVR/ UA/ PSA.  Hollice Espy, MD  [facility] [address] [place], [place] [address] [phone]

## 2019-05-11 ENCOUNTER — Telehealth: Payer: Self-pay | Admitting: *Deleted

## 2019-05-11 LAB — PSA: Prostate Specific Ag, Serum: 0.3 ng/mL (ref 0.0–4.0)

## 2019-05-11 NOTE — Telephone Encounter (Addendum)
Patient informed-verbalized understanding  ----- Message from Hollice Espy, MD sent at 05/11/2019  8:00 AM EDT ----- PSA is stable  Hollice Espy, MD

## 2019-05-18 ENCOUNTER — Inpatient Hospital Stay: Payer: Medicare HMO | Attending: Radiation Oncology

## 2019-05-18 ENCOUNTER — Other Ambulatory Visit: Payer: Self-pay

## 2019-05-18 DIAGNOSIS — C61 Malignant neoplasm of prostate: Secondary | ICD-10-CM | POA: Diagnosis not present

## 2019-05-18 LAB — PSA: Prostatic Specific Antigen: 0.22 ng/mL (ref 0.00–4.00)

## 2019-05-20 DIAGNOSIS — F411 Generalized anxiety disorder: Secondary | ICD-10-CM | POA: Diagnosis not present

## 2019-05-24 ENCOUNTER — Other Ambulatory Visit: Payer: Self-pay

## 2019-05-25 ENCOUNTER — Ambulatory Visit
Admission: RE | Admit: 2019-05-25 | Discharge: 2019-05-25 | Disposition: A | Discharge status: Home / Self Care | Payer: Medicare HMO | Source: Ambulatory Visit | Attending: Radiation Oncology | Admitting: Radiation Oncology

## 2019-05-25 ENCOUNTER — Encounter: Payer: Self-pay | Admitting: Radiation Oncology

## 2019-05-25 ENCOUNTER — Other Ambulatory Visit: Payer: Self-pay

## 2019-05-25 VITALS — BP 139/80 | HR 64 | Temp 96.7°F | Wt 259.8 lb

## 2019-05-25 DIAGNOSIS — C61 Malignant neoplasm of prostate: Secondary | ICD-10-CM | POA: Diagnosis not present

## 2019-05-25 DIAGNOSIS — Z923 Personal history of irradiation: Secondary | ICD-10-CM | POA: Diagnosis not present

## 2019-05-25 NOTE — Progress Notes (Signed)
Radiation Oncology Follow up Note  Name: Christopher Orr   Date:   05/25/2019 MRN:  [id number] DOB: May 15, 1946    This 73 y.o. male presents to the clinic today for 3-year follow-up status post I-125 interstitial implant for Gleason 6 (3+3) adenocarcinoma the prostate.  REFERRING PROVIDER: Jodi Marble, MD  HPI: Patient is a 73 year old male now out 3 years having completed I-125 interstitial implant for Gleason 6 (3+3) adenocarcinoma the prostate presenting with a PSA of 3.8..  He is seen today in routine follow-up is doing well specifically denies any increased lower urinary tract symptoms diarrhea or fatigue.  His most recent PSA is 0.2 down by 50% from 1 year prior.  COMPLICATIONS OF TREATMENT: none  FOLLOW UP COMPLIANCE: keeps appointments   PHYSICAL EXAM:  BP 139/80   Pulse 64   Temp (!) 96.7 F (35.9 C) (Tympanic)   Wt 259 lb 12.8 oz (117.8 kg)   BMI 35.24 kg/m  Well-developed well-nourished patient in NAD. HEENT reveals PERLA, EOMI, discs not visualized.  Oral cavity is clear. No oral mucosal lesions are identified. Neck is clear without evidence of cervical or supraclavicular adenopathy. Lungs are clear to A&P. Cardiac examination is essentially unremarkable with regular rate and rhythm without murmur rub or thrill. Abdomen is benign with no organomegaly or masses noted. Motor sensory and DTR levels are equal and symmetric in the upper and lower extremities. Cranial nerves II through XII are grossly intact. Proprioception is intact. No peripheral adenopathy or edema is identified. No motor or sensory levels are noted. Crude visual fields are within normal range.  RADIOLOGY RESULTS: No current films to review  PLAN: Present time patient is doing well under excellent biochemical control of his prostate cancer 3 years out from I-125 interstitial implant.  I have asked to see him back in 1 year for follow-up.  With a PSA prior to that visit.  Patient knows to call with any  concerns at any time.  I would like to take this opportunity to thank you for allowing me to participate in the care of your patient.Noreene Filbert, MD

## 2019-05-26 DIAGNOSIS — R55 Syncope and collapse: Secondary | ICD-10-CM | POA: Diagnosis not present

## 2019-05-26 DIAGNOSIS — H538 Other visual disturbances: Secondary | ICD-10-CM | POA: Diagnosis not present

## 2019-05-26 DIAGNOSIS — G43909 Migraine, unspecified, not intractable, without status migrainosus: Secondary | ICD-10-CM | POA: Diagnosis not present

## 2019-06-14 DIAGNOSIS — Z7951 Long term (current) use of inhaled steroids: Secondary | ICD-10-CM | POA: Diagnosis not present

## 2019-06-14 DIAGNOSIS — J449 Chronic obstructive pulmonary disease, unspecified: Secondary | ICD-10-CM | POA: Diagnosis not present

## 2019-07-08 DIAGNOSIS — G3184 Mild cognitive impairment, so stated: Secondary | ICD-10-CM | POA: Diagnosis not present

## 2019-07-08 DIAGNOSIS — M79605 Pain in left leg: Secondary | ICD-10-CM | POA: Diagnosis not present

## 2019-07-08 DIAGNOSIS — F419 Anxiety disorder, unspecified: Secondary | ICD-10-CM | POA: Diagnosis not present

## 2019-07-08 DIAGNOSIS — F329 Major depressive disorder, single episode, unspecified: Secondary | ICD-10-CM | POA: Diagnosis not present

## 2019-07-15 DIAGNOSIS — F411 Generalized anxiety disorder: Secondary | ICD-10-CM | POA: Diagnosis not present

## 2019-08-12 ENCOUNTER — Other Ambulatory Visit: Payer: Self-pay

## 2019-08-12 ENCOUNTER — Ambulatory Visit (INDEPENDENT_AMBULATORY_CARE_PROVIDER_SITE_OTHER): Payer: Medicare HMO | Admitting: Internal Medicine

## 2019-08-12 VITALS — Ht 72.0 in | Wt 260.0 lb

## 2019-08-12 DIAGNOSIS — E559 Vitamin D deficiency, unspecified: Secondary | ICD-10-CM

## 2019-08-12 DIAGNOSIS — K7460 Unspecified cirrhosis of liver: Secondary | ICD-10-CM

## 2019-08-12 DIAGNOSIS — Z8719 Personal history of other diseases of the digestive system: Secondary | ICD-10-CM

## 2019-08-12 DIAGNOSIS — F419 Anxiety disorder, unspecified: Secondary | ICD-10-CM | POA: Diagnosis not present

## 2019-08-12 DIAGNOSIS — Z1159 Encounter for screening for other viral diseases: Secondary | ICD-10-CM | POA: Diagnosis not present

## 2019-08-12 DIAGNOSIS — R4189 Other symptoms and signs involving cognitive functions and awareness: Secondary | ICD-10-CM

## 2019-08-12 DIAGNOSIS — G4700 Insomnia, unspecified: Secondary | ICD-10-CM

## 2019-08-12 DIAGNOSIS — Z720 Tobacco use: Secondary | ICD-10-CM

## 2019-08-12 DIAGNOSIS — M1990 Unspecified osteoarthritis, unspecified site: Secondary | ICD-10-CM

## 2019-08-12 DIAGNOSIS — Z8546 Personal history of malignant neoplasm of prostate: Secondary | ICD-10-CM

## 2019-08-12 DIAGNOSIS — Z1329 Encounter for screening for other suspected endocrine disorder: Secondary | ICD-10-CM | POA: Diagnosis not present

## 2019-08-12 DIAGNOSIS — R413 Other amnesia: Secondary | ICD-10-CM

## 2019-08-12 DIAGNOSIS — Z13818 Encounter for screening for other digestive system disorders: Secondary | ICD-10-CM | POA: Diagnosis not present

## 2019-08-12 DIAGNOSIS — Z13220 Encounter for screening for lipoid disorders: Secondary | ICD-10-CM

## 2019-08-12 DIAGNOSIS — N281 Cyst of kidney, acquired: Secondary | ICD-10-CM

## 2019-08-12 DIAGNOSIS — K219 Gastro-esophageal reflux disease without esophagitis: Secondary | ICD-10-CM

## 2019-08-12 DIAGNOSIS — N400 Enlarged prostate without lower urinary tract symptoms: Secondary | ICD-10-CM

## 2019-08-12 DIAGNOSIS — M5136 Other intervertebral disc degeneration, lumbar region: Secondary | ICD-10-CM

## 2019-08-12 DIAGNOSIS — N289 Disorder of kidney and ureter, unspecified: Secondary | ICD-10-CM

## 2019-08-12 DIAGNOSIS — K4021 Bilateral inguinal hernia, without obstruction or gangrene, recurrent: Secondary | ICD-10-CM

## 2019-08-12 DIAGNOSIS — J449 Chronic obstructive pulmonary disease, unspecified: Secondary | ICD-10-CM

## 2019-08-12 DIAGNOSIS — F39 Unspecified mood [affective] disorder: Secondary | ICD-10-CM

## 2019-08-12 DIAGNOSIS — R739 Hyperglycemia, unspecified: Secondary | ICD-10-CM | POA: Diagnosis not present

## 2019-08-12 DIAGNOSIS — R911 Solitary pulmonary nodule: Secondary | ICD-10-CM

## 2019-08-12 DIAGNOSIS — I2510 Atherosclerotic heart disease of native coronary artery without angina pectoris: Secondary | ICD-10-CM

## 2019-08-12 DIAGNOSIS — Z1389 Encounter for screening for other disorder: Secondary | ICD-10-CM

## 2019-08-12 DIAGNOSIS — E785 Hyperlipidemia, unspecified: Secondary | ICD-10-CM

## 2019-08-12 DIAGNOSIS — M159 Polyosteoarthritis, unspecified: Secondary | ICD-10-CM

## 2019-08-12 MED ORDER — ALPRAZOLAM 1 MG PO TABS: 1.0000 mg | ORAL_TABLET | ORAL | Status: DC

## 2019-08-12 NOTE — Progress Notes (Signed)
Virtual Visit via Video Note  I connected with Christopher Orr [facility])  on 08/12/19 at  3:50 PM EST by a video enabled telemedicine application and verified that I am speaking with the correct person using two identifiers.  Location patient: home Location provider:work or home office Persons participating in the virtual visit: patient, provider, pts 2 toddler grandkids  I discussed the limitations of evaluation and management by telemedicine and the availability of in person appointments. The patient expressed understanding and agreed to proceed.   HPI: 1. TOC from [place] he wants me to review his chart he reports h/o COPD, cirrhosis and wants to make sure this is correct  2. Cirrhosis he reports he drinks 1 pt of liquor qd and per history h/o hep C tx'ed Harvoni 11/03/14 this is a confirmed dx 3. Imaging notes right lung nodule/COPD he has been a smoker 1 ppd x 55 years and during this time had quit for 2 years  4. CAD s/p PCI on plavix and BB  5. Memory impairment on namenda 5 mg bid taking qd and aricept  5mg  qd, mood d/o/insomnia/panic/ptsd he was seeing Dr. Jacqualine Code at [facility] but he died 01/18/19 and he has another appt with psychiatry in 1 month at [facility] He reports he asked a doctor about abilify was was put on 10 mg qd  6. Reviewed all medications and updated medication list    ROS: See pertinent positives and negatives per HPI.  Past Medical History:  Diagnosis Date  . Alcohol abuse   . Anemia   . Arthritis   . Bipolar disorder ([place])   . BPH (benign prostatic hypertrophy) with urinary obstruction   . CAD (coronary artery disease)    a. s/p stent to LAD in 2006 at [facility], EF 60% at that time.  . Cancer [facility])    prostate  . CHF (congestive heart failure) ([place])   . Chronic pancreatitis ([place])   . COPD (chronic obstructive pulmonary disease) ([place])   . Depression   . Diverticulosis   . Drug use   . Dyspnea   . ED (erectile dysfunction)   . Gastritis   . GERD (gastroesophageal reflux  disease)   . Headache    occasional migraines  . Hepatitis C    treated  . History of lumbar fusion   . Hyperlipidemia   . Hypertension    patient denies having high blood pressure  . Mitral regurgitation   . Pneumonia    07/06/18   . PTSD (post-traumatic stress disorder)   . Rib fracture    s/p fall 03/15/19   . Sinus disease   . Urge incontinence     Past Surgical History:  Procedure Laterality Date  . cateract  2013  . CORONARY STENT PLACEMENT  2006   x 1  . ESOPHAGOGASTRODUODENOSCOPY N/A 12/22/2014   Procedure: ESOPHAGOGASTRODUODENOSCOPY (EGD);  Surgeon: [name], MD;  Location: [facility] ENDOSCOPY;  Service: Endoscopy;  Laterality: N/A;  . HERNIA REPAIR  2015   left groin  . LUMBAR FUSION    . RADIOACTIVE SEED IMPLANT N/A 04/22/2016   Procedure: RADIOACTIVE SEED IMPLANT/BRACHYTHERAPY IMPLANT;  Surgeon: Hollice Espy, MD;  Location: [facility] ORS;  Service: Urology;  Laterality: N/A;  . ROBOT ASSISTED INGUINAL HERNIA REPAIR Bilateral 07/06/2018   Procedure: ROBOT ASSISTED INGUINAL HERNIA REPAIR;  Surgeon: Jules Husbands, MD;  Location: [facility] ORS;  Service: General;  Laterality: Bilateral;  . TONSILLECTOMY    . UMBILICAL HERNIA REPAIR N/A 07/06/2018   Procedure: LAPAROSCOPIC  ROBOT ASSISTED UMBILICAL HERNIA;  Surgeon: Jules Husbands, MD;  Location: [facility] ORS;  Service: General;  Laterality: N/A;    Family History  Problem Relation Age of Onset  . Heart disease Father        Unclear details. Father passed in late 77's 2/2 cancer.  . Colon cancer Father   . Alcohol abuse Sister   . Drug abuse Sister   . Anxiety disorder Sister   . Depression Sister   . Kidney disease Neg Hx   . Prostate cancer Neg Hx     SOCIAL HX:  Lives at home    Current Outpatient Medications:  .  albuterol (PROVENTIL) (2.5 MG/3ML) 0.083% nebulizer solution, Take 3 mLs (2.5 mg total) by nebulization every 4 (four) hours as needed for wheezing or shortness of breath., Disp: 360 mL, Rfl: 3 .   alprazolam (XANAX) 1 MG tablet, Take 1 tablet (1 mg total) by mouth as directed. 1/2 in am 1 pill qhs, Disp: , Rfl:  .  ARIPiprazole (ABILIFY) 10 MG tablet, Take 10 mg by mouth daily., Disp: , Rfl:  .  aspirin 81 MG tablet, Take 81 mg by mouth daily. Reported on 01/15/2016, Disp: , Rfl:  .  budesonide-formoterol (SYMBICORT) 160-4.5 MCG/ACT inhaler, Inhale 2 puffs into the lungs 2 (two) times daily. Rinse mouth out, Disp: 1 Inhaler, Rfl: 12 .  celecoxib (CELEBREX) 200 MG capsule, Take 1 capsule (200 mg total) by mouth daily after breakfast., Disp: 90 capsule, Rfl: 0 .  clopidogrel (PLAVIX) 75 MG tablet, Take 1 tablet (75 mg total) by mouth daily. Reported on 01/15/2016, Disp: 90 tablet, Rfl: 3 .  cyanocobalamin 1000 MCG tablet, Take 1,000 mcg by mouth daily., Disp: , Rfl:  .  donepezil (ARICEPT) 5 MG tablet, Take 1 tablet (5 mg total) by mouth at bedtime., Disp: 90 tablet, Rfl: 3 .  FLUoxetine (PROZAC) 20 MG capsule, Take 60 mg by mouth daily., Disp: , Rfl: 1 .  folic acid (FOLVITE) 1 MG tablet, TAKE 1 TABLET BY MOUTH EVERY DAY, Disp: 90 tablet, Rfl: 0 .  memantine (NAMENDA) 5 MG tablet, Take 1 tablet (5 mg total) by mouth 2 (two) times daily. Taking qd, Disp: 180 tablet, Rfl: 3 .  metoprolol succinate (TOPROL-XL) 25 MG 24 hr tablet, Take 1 tablet (25 mg total) by mouth daily., Disp: 90 tablet, Rfl: 3 .  Multiple Vitamins-Minerals (MULTIVITAMIN WITH MINERALS) tablet, Take 1 tablet by mouth daily., Disp: , Rfl:  .  MYRBETRIQ 50 MG TB24 tablet, TAKE 1 TABLET BY MOUTH EVERY DAY, Disp: 90 tablet, Rfl: 0 .  omega-3 fish oil (MAXEPA) 1000 MG CAPS capsule, Take 1 capsule by mouth daily. , Disp: , Rfl:  .  pantoprazole (PROTONIX) 40 MG tablet, Take 1 tablet (40 mg total) by mouth daily. 30 min before breakfast, Disp: 90 tablet, Rfl: 3 .  rosuvastatin (CRESTOR) 10 MG tablet, Take 1 tablet (10 mg total) by mouth at bedtime., Disp: 90 tablet, Rfl: 3 .  tamsulosin (FLOMAX) 0.4 MG CAPS capsule, Take 1 capsule (0.4  mg total) by mouth daily after supper., Disp: 90 capsule, Rfl: 3 .  traZODone (DESYREL) 150 MG tablet, Take 1 tablet (150 mg total) by mouth at bedtime., Disp: 90 tablet, Rfl: 3  EXAM:  VITALS per patient if applicable:  GENERAL: alert, oriented, appears well and in no acute distress  HEENT: atraumatic, conjunttiva clear, no obvious abnormalities on inspection of external nose and ears  NECK: normal movements of the head and  neck  LUNGS: on inspection no signs of respiratory distress, breathing rate appears normal, no obvious gross SOB, gasping or wheezing  CV: no obvious cyanosis  MS: moves all visible extremities without noticeable abnormality  PSYCH/NEURO: pleasant and cooperative, no obvious depression or anxiety, speech and thought processing grossly intact  ASSESSMENT AND PLAN:  Discussed the following assessment and plan:  Cirrhosis of liver without ascites could be related to alcohol abuse drinking 1 pt liq qd vs h/o hep C tx Harvoni in the past  Hep A/B immune  Check hep C status quant.   Anxiety/insomnia/mood d/o and memory impairment - Plan: alprazolam (XANAX) 1 MG tablet (taking 1/2 pill qam and 1 pm qhs per pt [facility] is giving him this, traZODone (DESYREL) 150 MG tablet qhs Cont other meds list is accurate  F/u [facility] 08/2019 with new psych MD  Cont f/u with neurology appt 12/2019    Chronic obstructive pulmonary disease, unspecified COPD type ([place]) - Plan: albuterol (PROVENTIL) (2.5 MG/3ML) 0.083% nebulizer solution, budesonide-formoterol (SYMBICORT) 160-4.5 MCG/ACT inhaler, CT Chest Wo Contrast for lung nodule RML rec smoking cessation   Osteoarthritis of multiple joints, with DDD lumbar s/p lumbar fusion- Plan: celecoxib (CELEBREX) 200 MG capsule reeval need for this medication given cad s/p PCI history in the future   CAD/Hyperlipidemia, unspecified hyperlipidemia type - Plan: rosuvastatin (CRESTOR) 10 MG tablet Cont BB Cont plavix f/u with Dr. Fletcher Anon last seen  02/2019   Gastroesophageal reflux disease, unspecified whether esophagitis present - Plan: pantoprazole (PROTONIX) 40 MG tablet  Qd   Kidney lesion ?cyst - Plan MRI ab with and w/o contrast further characterize   Bilateral recurrent inguinal hernia without obstruction or gangrene/umbilical hernia  -review on MRI ab  HM Flu shot utd  prevnar utd pna 23 due in 1 year  Tdap consider future  shingrix consider future   COPD + Smoker 1 ppd x 55 years as of 07/2019 and had quit x 2 years in the past  H/o hep C tx'ed Harvoni last 11/13/15 not detected quant will recheck with hep A immune 09/21/14/hep B >1000 09/21/14 and sAg negative  Alcohol abuse drinks 1 pt liquor qd  H/o prostate cancer s/p brachytx with seeds PSA 05/18/19 0.22 f/u Dr.Brandon urology 10/2019 and Dr. Massie Maroon rad/onc Skin-examine at f/u  Colonoscopy none on file had EGD [facility] 12/22/14 consider in future vs cologuard if qualifies  Neurology Dr. Manuella Ghazi f/u appt 01/06/20  Cards Dr. Fletcher Anon  [facility] psych   -we discussed possible serious and likely etiologies, options for evaluation and workup, limitations of telemedicine visit vs in person visit, treatment, treatment risks and precautions. Pt prefers to treat via telemedicine empirically rather then risking or undertaking an in person visit at this moment. Patient agrees to seek prompt in person care if worsening, new symptoms arise, or if is not improving with treatment.   I discussed the assessment and treatment plan with the patient. The patient was provided an opportunity to ask questions and all were answered. The patient agreed with the plan and demonstrated an understanding of the instructions.   The patient was advised to call back or seek an in-person evaluation if the symptoms worsen or if the condition fails to improve as anticipated.  Time spent 30-39 minutes  Delorise Jackson, MD

## 2019-08-15 ENCOUNTER — Encounter: Payer: Self-pay | Admitting: Internal Medicine

## 2019-08-15 DIAGNOSIS — Z8546 Personal history of malignant neoplasm of prostate: Secondary | ICD-10-CM

## 2019-08-15 DIAGNOSIS — R911 Solitary pulmonary nodule: Secondary | ICD-10-CM

## 2019-08-15 DIAGNOSIS — G4700 Insomnia, unspecified: Secondary | ICD-10-CM | POA: Insufficient documentation

## 2019-08-15 DIAGNOSIS — I2510 Atherosclerotic heart disease of native coronary artery without angina pectoris: Secondary | ICD-10-CM | POA: Insufficient documentation

## 2019-08-15 DIAGNOSIS — M1990 Unspecified osteoarthritis, unspecified site: Secondary | ICD-10-CM | POA: Insufficient documentation

## 2019-08-15 DIAGNOSIS — Z8719 Personal history of other diseases of the digestive system: Secondary | ICD-10-CM

## 2019-08-15 DIAGNOSIS — K7460 Unspecified cirrhosis of liver: Secondary | ICD-10-CM | POA: Insufficient documentation

## 2019-08-15 DIAGNOSIS — K4020 Bilateral inguinal hernia, without obstruction or gangrene, not specified as recurrent: Secondary | ICD-10-CM | POA: Insufficient documentation

## 2019-08-15 DIAGNOSIS — Z720 Tobacco use: Secondary | ICD-10-CM | POA: Insufficient documentation

## 2019-08-15 DIAGNOSIS — R413 Other amnesia: Secondary | ICD-10-CM | POA: Insufficient documentation

## 2019-08-15 DIAGNOSIS — N289 Disorder of kidney and ureter, unspecified: Secondary | ICD-10-CM | POA: Insufficient documentation

## 2019-08-15 HISTORY — DX: Unspecified osteoarthritis, unspecified site: M19.90

## 2019-08-15 HISTORY — DX: Unspecified cirrhosis of liver: K74.60

## 2019-08-15 HISTORY — DX: Solitary pulmonary nodule: R91.1

## 2019-08-15 HISTORY — DX: Personal history of malignant neoplasm of prostate: Z85.46

## 2019-08-15 HISTORY — DX: Personal history of other diseases of the digestive system: Z87.19

## 2019-08-15 HISTORY — DX: Other amnesia: R41.3

## 2019-08-15 HISTORY — DX: Bilateral inguinal hernia, without obstruction or gangrene, not specified as recurrent: K40.20

## 2019-08-15 MED ORDER — MEMANTINE HCL 5 MG PO TABS: 5.0000 mg | ORAL_TABLET | Freq: Two times a day (BID) | ORAL | 3 refills | Status: DC

## 2019-08-15 MED ORDER — ROSUVASTATIN CALCIUM 10 MG PO TABS: 10.0000 mg | ORAL_TABLET | Freq: Every day | ORAL | 3 refills | Status: DC

## 2019-08-15 MED ORDER — PANTOPRAZOLE SODIUM 40 MG PO TBEC: 40.0000 mg | DELAYED_RELEASE_TABLET | Freq: Every day | ORAL | 3 refills | Status: DC

## 2019-08-15 MED ORDER — BUDESONIDE-FORMOTEROL FUMARATE 160-4.5 MCG/ACT IN AERO: 2.0000 | INHALATION_SPRAY | Freq: Two times a day (BID) | RESPIRATORY_TRACT | 12 refills | Status: DC

## 2019-08-15 MED ORDER — TRAZODONE HCL 150 MG PO TABS: 150.0000 mg | ORAL_TABLET | Freq: Every day | ORAL | 3 refills | Status: DC

## 2019-08-15 MED ORDER — CLOPIDOGREL BISULFATE 75 MG PO TABS: 75.0000 mg | ORAL_TABLET | Freq: Every day | ORAL | 3 refills | Status: DC

## 2019-08-15 MED ORDER — TAMSULOSIN HCL 0.4 MG PO CAPS: 0.4000 mg | ORAL_CAPSULE | Freq: Every day | ORAL | 3 refills | Status: DC

## 2019-08-15 MED ORDER — METOPROLOL SUCCINATE ER 25 MG PO TB24: 25.0000 mg | ORAL_TABLET | Freq: Every day | ORAL | 3 refills | Status: DC

## 2019-08-15 MED ORDER — ALBUTEROL SULFATE (2.5 MG/3ML) 0.083% IN NEBU: 2.5000 mg | INHALATION_SOLUTION | RESPIRATORY_TRACT | 3 refills | Status: DC | PRN

## 2019-08-15 MED ORDER — CELECOXIB 200 MG PO CAPS: 200.0000 mg | ORAL_CAPSULE | Freq: Every day | ORAL | 0 refills | Status: DC

## 2019-08-15 MED ORDER — DONEPEZIL HCL 5 MG PO TABS: 5.0000 mg | ORAL_TABLET | Freq: Every day | ORAL | 3 refills | Status: DC

## 2019-08-18 ENCOUNTER — Other Ambulatory Visit: Payer: Self-pay | Admitting: Urology

## 2019-08-30 ENCOUNTER — Ambulatory Visit
Admission: RE | Admit: 2019-08-30 | Discharge: 2019-08-30 | Disposition: A | Discharge status: Home / Self Care | Payer: Medicare HMO | Source: Ambulatory Visit | Attending: Internal Medicine | Admitting: Internal Medicine

## 2019-08-30 ENCOUNTER — Other Ambulatory Visit: Payer: Self-pay

## 2019-08-30 DIAGNOSIS — Z8546 Personal history of malignant neoplasm of prostate: Secondary | ICD-10-CM | POA: Diagnosis not present

## 2019-08-30 DIAGNOSIS — K7460 Unspecified cirrhosis of liver: Secondary | ICD-10-CM | POA: Insufficient documentation

## 2019-08-30 DIAGNOSIS — R911 Solitary pulmonary nodule: Secondary | ICD-10-CM | POA: Insufficient documentation

## 2019-08-30 DIAGNOSIS — J449 Chronic obstructive pulmonary disease, unspecified: Secondary | ICD-10-CM | POA: Insufficient documentation

## 2019-08-30 DIAGNOSIS — N289 Disorder of kidney and ureter, unspecified: Secondary | ICD-10-CM

## 2019-08-30 DIAGNOSIS — N281 Cyst of kidney, acquired: Secondary | ICD-10-CM | POA: Insufficient documentation

## 2019-08-30 DIAGNOSIS — Z720 Tobacco use: Secondary | ICD-10-CM | POA: Insufficient documentation

## 2019-08-30 LAB — POCT I-STAT CREATININE: Creatinine, Ser: 1.2 mg/dL (ref 0.61–1.24)

## 2019-08-30 MED ORDER — GADOBUTROL 1 MMOL/ML IV SOLN
10.0000 mL | Freq: Once | INTRAVENOUS | Status: AC | PRN
  Administered 2019-08-30: 10 mL via INTRAVENOUS

## 2019-08-31 ENCOUNTER — Other Ambulatory Visit: Payer: Self-pay | Admitting: Internal Medicine

## 2019-08-31 ENCOUNTER — Telehealth: Payer: Self-pay | Admitting: Internal Medicine

## 2019-08-31 DIAGNOSIS — R911 Solitary pulmonary nodule: Secondary | ICD-10-CM

## 2019-08-31 DIAGNOSIS — C3492 Malignant neoplasm of unspecified part of left bronchus or lung: Secondary | ICD-10-CM

## 2019-08-31 NOTE — Telephone Encounter (Signed)
Dr. Erlene Quan can you review this MRI and f/u with the patient about this needs  rec MRI f/u in 1 year    TECHNIQUE: Multiplanar multisequence MR imaging of the abdomen was performed both before and after the administration of intravenous contrast.  CONTRAST:  18mL GADAVIST GADOBUTROL 1 MMOL/ML IV SOLN  COMPARISON:  CT abdomen/pelvis dated 05/25/2018  FINDINGS: Lower chest: Lung bases are clear.  Hepatobiliary: Scattered hepatic cysts measuring up to 7 mm in the left hepatic lobe (series 4/image 6). No suspicious/enhancing hepatic lesions.  Gallbladder is unremarkable. No intrahepatic or extrahepatic ductal dilatation.  Pancreas:  Within normal limits.  Spleen: 13 mm enhancing lesion in the central spleen (series 12/image 4), favoring a benign hemangioma.  Adrenals/Urinary Tract:  Adrenal glands are within normal limits.  Multiple bilateral renal cysts, many of which are simple, measuring up to 3.2 cm in the medial right lower kidney (series 4/image 23), benign (Bosniak I).  Dominant 4.1 cm cyst in the anterior right lower kidney (series 4/image 26) is notable for layering hemorrhage, benign (Bosniak II). Additional 10 mm lesion in the lateral left lower kidney (series 4/image 24), with intrinsic T1 hyperintensity but no enhancement following contrast administration, compatible with a benign hemorrhagic cyst (Bosniak II).  Dr. Erlene Quan  -->Can you review MRI and f/u with him about this?   A 15 mm complex cystic lesion in the anterior right lower kidney (series 4/image 19) is notable for a single thin septation and lateral mural nodule with intrinsic T1 hyperintensity/hemorrhage (series 11/image 45). This previously measured 2.3 cm on CT and therefore favors retracted clot within a complex cyst. No definite enhancement, although this is difficult to entirely exclude given motion. Although this is favored to be benign, this is considered a Bosniak IIF lesion,  warranting at least one additional follow-up.  No hydronephrosis.  Stomach/Bowel: Stomach is within normal limits.  Visualized bowel is notable for scattered colonic diverticulosis, without evidence of diverticulitis. Normal appendix (series 4/image 32).  Vascular/Lymphatic:  No evidence of abdominal aortic aneurysm.  No suspicious abdominal lymphadenopathy.  Other:  No abdominal ascites.  Musculoskeletal: Status post PLIF at L4-5. Degenerative changes of the lumbar spine.  IMPRESSION: 15 mm complex cystic lesion with hemorrhagic mural nodule in the anterior right lower kidney, previously 2.3 cm. While favored to be benign, this is considered a Bosniak IIF lesion, warranting at least one additional follow-up. Given low risk, follow-up MRI abdomen with/without contrast in 1 year would likely suffice.  Additional benign simple and hemorrhagic cysts bilaterally, as described above (Bosniak I-II).

## 2019-09-01 ENCOUNTER — Telehealth: Payer: Self-pay | Admitting: Internal Medicine

## 2019-09-01 ENCOUNTER — Other Ambulatory Visit: Payer: Self-pay | Admitting: Internal Medicine

## 2019-09-01 ENCOUNTER — Encounter: Payer: Self-pay | Admitting: *Deleted

## 2019-09-01 DIAGNOSIS — C3492 Malignant neoplasm of unspecified part of left bronchus or lung: Secondary | ICD-10-CM

## 2019-09-01 NOTE — Telephone Encounter (Signed)
He is scheduled to see me in 10/2019, will address and follow.  Hollice Espy, MD

## 2019-09-01 NOTE — Telephone Encounter (Signed)
Want PET 09/07/19 same time need to cancel AXUMIN order and placed new order   Contact insurance and scheduling please   Thanks [place]

## 2019-09-01 NOTE — Progress Notes (Signed)
  Oncology Nurse Navigator Documentation  Navigator Location: [facility]Med Onc (09/01/19 1400) Referral Date to RadOnc/MedOnc: 08/31/19 (09/01/19 1400) )Navigator Encounter Type: Introductory Phone Call (09/01/19 1400)   Abnormal Finding Date: 08/30/19 (09/01/19 1400)                   Treatment Phase: Abnormal Scans (09/01/19 1400) Barriers/Navigation Needs: Coordination of Care (09/01/19 1400)   Interventions: Coordination of Care (09/01/19 1400)   Coordination of Care: Appts (09/01/19 1400)         phone call made to patient to review upcoming appts. Pt scheduled to see Dr. Genevive Bi tomorrow morning at 8am then he will come over to the [place] to see Dr. Janese Banks at [place] given and instructed to call with any further questions or needs. Pt verbalized understanding.          Time Spent with Patient: 30 (09/01/19 1400)

## 2019-09-01 NOTE — Telephone Encounter (Signed)
See Dr. Audrie Gallus note . [employer]

## 2019-09-02 ENCOUNTER — Encounter: Payer: Self-pay | Admitting: Oncology

## 2019-09-02 ENCOUNTER — Other Ambulatory Visit: Payer: Self-pay

## 2019-09-02 ENCOUNTER — Telehealth: Payer: Self-pay

## 2019-09-02 ENCOUNTER — Inpatient Hospital Stay: Payer: Medicare HMO | Attending: Oncology | Admitting: Oncology

## 2019-09-02 ENCOUNTER — Encounter: Payer: Self-pay | Admitting: Cardiothoracic Surgery

## 2019-09-02 ENCOUNTER — Encounter: Payer: Self-pay | Admitting: *Deleted

## 2019-09-02 ENCOUNTER — Ambulatory Visit (INDEPENDENT_AMBULATORY_CARE_PROVIDER_SITE_OTHER): Payer: Medicare HMO | Admitting: Cardiothoracic Surgery

## 2019-09-02 VITALS — BP 133/81 | HR 81 | Temp 96.4°F | Resp 16 | Ht 72.0 in | Wt 264.0 lb

## 2019-09-02 VITALS — BP 123/87 | HR 75 | Temp 96.4°F | Resp 16 | Ht 72.0 in | Wt 264.0 lb

## 2019-09-02 DIAGNOSIS — F17210 Nicotine dependence, cigarettes, uncomplicated: Secondary | ICD-10-CM

## 2019-09-02 DIAGNOSIS — I110 Hypertensive heart disease with heart failure: Secondary | ICD-10-CM | POA: Diagnosis not present

## 2019-09-02 DIAGNOSIS — I2510 Atherosclerotic heart disease of native coronary artery without angina pectoris: Secondary | ICD-10-CM

## 2019-09-02 DIAGNOSIS — R911 Solitary pulmonary nodule: Secondary | ICD-10-CM | POA: Diagnosis not present

## 2019-09-02 DIAGNOSIS — B1920 Unspecified viral hepatitis C without hepatic coma: Secondary | ICD-10-CM

## 2019-09-02 DIAGNOSIS — R918 Other nonspecific abnormal finding of lung field: Secondary | ICD-10-CM

## 2019-09-02 DIAGNOSIS — J449 Chronic obstructive pulmonary disease, unspecified: Secondary | ICD-10-CM | POA: Diagnosis not present

## 2019-09-02 DIAGNOSIS — K7460 Unspecified cirrhosis of liver: Secondary | ICD-10-CM

## 2019-09-02 NOTE — Patient Instructions (Addendum)
We will schedule and Pulmonary function test. I will call you later today with appointment information.   PET scan scheduled 09/07/19 @ 8:00 am at [facility]. Do not eat/drink 6 hours prior to the scan.   Please see your follow up appointment listed below.

## 2019-09-02 NOTE — Telephone Encounter (Signed)
Covid Test 09/07/19 at [place] 10-12pm Pulmonary Function test 09/08/19 @ 8 am, no caffeine or in hailers.   Patient verbalized understanding

## 2019-09-02 NOTE — Telephone Encounter (Signed)
Spoke with Pamala Hurry to schedule PFT's -she will call me back with appointment information.

## 2019-09-02 NOTE — Progress Notes (Signed)
Pt lives in his home. He lets son and 2 grandchildren live with him. He does get sob on exertion. Sat 96% on RA

## 2019-09-02 NOTE — Progress Notes (Signed)
  Oncology Nurse Navigator Documentation  Navigator Location: [facility] Onc (09/02/19 1000)   )Navigator Encounter Type: Initial MedOnc (09/02/19 1000)               Multidisiplinary Clinic Date: 09/02/19 (09/02/19 1000) Multidisiplinary Clinic Type: Thoracic (09/02/19 1000)       Barriers/Navigation Needs: Coordination of Care (09/02/19 1000)   Interventions: Coordination of Care (09/02/19 1000)   Coordination of Care: Appts (09/02/19 1000)        Acuity: Level 2-Minimal Needs (1-2 Barriers Identified) (09/02/19 1000)      met with patient during initial med-onc consultation with Dr. Janese Banks. All questions answered during visit. Reviewed upcoming appts. Instructed pt that will give him a call when he needs to follow up with [place]. Contact info given and instructed to call with any further questions or needs. Pt verbalized understanding. Nothing further needed at this time.   Time Spent with Patient: 45 (09/02/19 1000)

## 2019-09-02 NOTE — Progress Notes (Signed)
Patient ID: Christopher Orr, male   DOB: May 27, 1946, 74 y.o.   MRN: [id number]  Chief Complaint  Patient presents with  . New Patient (Initial Visit)    left lung nodule    Referred By Dr. Olivia Mackie McLean-Scocuzza Reason for Referral left lower lobe mass  HPI Location, Quality, Duration, Severity, Timing, Context, Modifying Factors, Associated Signs and Symptoms.  Christopher Orr is a 74 y.o. male.  He presents today for evaluation of a left lower lobe mass.  The patient does have a history of some memory loss and the history was somewhat difficult to obtain however he does state that he had a chest CT scan done because of his smoking history.  It sounds like this was part of the lung screening.  He also carries a diagnosis of prostate cancer pancreatitis COPD heart failure hypertension pancreatitis hepatitis C and cirrhosis.  He is on a multitude of medications including aspirin and Plavix for recent coronary stent.  He states that he smokes a pack cigarettes a day and has done so almost all his life.  He did quit for short period of time in the early 2000's.  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  He has no known asbestos exposure.  There is no family history of lung cancer.  He denied any recent hemoptysis or weight loss.  He is unable to walk a flight of stairs due to shortness of breath.  He also complains of shortness of breath even at rest and with minimal activities.  His chest CT scan showed a 1 cm left lower lobe mass that was most consistent with a malignancy.  In addition in the right lower lobe there is a bandlike area that is most likely inflammatory or infectious although malignancy is not excluded.   Past Medical History:  Diagnosis Date  . Alcohol abuse   . Anemia   . Arthritis   . Bipolar disorder ([place])   . BPH (benign prostatic hypertrophy) with urinary obstruction   . CAD (coronary artery disease)    a. s/p stent to LAD in 2006 at [facility], EF 60% at that time.  . Cancer [facility])     prostate  . CHF (congestive heart failure) ([place])   . Chronic pancreatitis ([place])   . COPD (chronic obstructive pulmonary disease) ([place])   . Depression   . Diverticulosis   . Drug use   . Dyspnea   . ED (erectile dysfunction)   . Gastritis   . GERD (gastroesophageal reflux disease)   . Headache    occasional migraines  . Hepatitis C    treated  . History of lumbar fusion   . Hyperlipidemia   . Hypertension    patient denies having high blood pressure  . Mitral regurgitation   . Pancreatitis    x 2   . Pneumonia    07/06/18   . PTSD (post-traumatic stress disorder)   . Rib fracture    s/p fall 03/15/19   . Sinus disease   . Urge incontinence     Past Surgical History:  Procedure Laterality Date  . cateract  2013  . CORONARY STENT PLACEMENT  2006   x 1  . ESOPHAGOGASTRODUODENOSCOPY N/A 12/22/2014   Procedure: ESOPHAGOGASTRODUODENOSCOPY (EGD);  Surgeon: [name], MD;  Location: [facility] ENDOSCOPY;  Service: Endoscopy;  Laterality: N/A;  . HERNIA REPAIR  2015   left groin  . LUMBAR FUSION    . RADIOACTIVE SEED IMPLANT N/A 04/22/2016   Procedure: RADIOACTIVE  SEED IMPLANT/BRACHYTHERAPY IMPLANT;  Surgeon: Hollice Espy, MD;  Location: [facility] ORS;  Service: Urology;  Laterality: N/A;  . ROBOT ASSISTED INGUINAL HERNIA REPAIR Bilateral 07/06/2018   Procedure: ROBOT ASSISTED INGUINAL HERNIA REPAIR;  Surgeon: Jules Husbands, MD;  Location: [facility] ORS;  Service: General;  Laterality: Bilateral;  . TONSILLECTOMY    . UMBILICAL HERNIA REPAIR N/A 07/06/2018   Procedure: LAPAROSCOPIC ROBOT ASSISTED UMBILICAL HERNIA;  Surgeon: Jules Husbands, MD;  Location: [facility] ORS;  Service: General;  Laterality: N/A;    Family History  Problem Relation Age of Onset  . Heart disease Father        Unclear details. Father passed in late 51's 2/2 cancer.  . Colon cancer Father   . Alcohol abuse Sister   . Drug abuse Sister   . Anxiety disorder Sister   . Depression Sister   . Kidney disease  Neg Hx   . Prostate cancer Neg Hx     Social History Social History   Tobacco Use  . Smoking status: Current Every Day Smoker    Packs/day: 1.00    Types: Cigarettes    Start date: 07/28/1962    Last attempt to quit: 10/26/2018    Years since quitting: 0.8  . Smokeless tobacco: Never Used  Substance Use Topics  . Alcohol use: Yes    Alcohol/week: 6.0 standard drinks    Types: 6 Cans of beer per week  . Drug use: Not Currently    Types: Marijuana    Comment: Endorsed heroin 06/2014, UDS also + benzos, opiates, THC, neg for cocaine at that time.    No Known Allergies  Current Outpatient Medications  Medication Sig Dispense Refill  . albuterol (PROVENTIL) (2.5 MG/3ML) 0.083% nebulizer solution Take 3 mLs (2.5 mg total) by nebulization every 4 (four) hours as needed for wheezing or shortness of breath. 360 mL 3  . alprazolam (XANAX) 1 MG tablet Take 1 tablet (1 mg total) by mouth as directed. 1/2 in am 1 pill qhs    . ARIPiprazole (ABILIFY) 10 MG tablet Take 10 mg by mouth daily.    Marland Kitchen aspirin 81 MG tablet Take 81 mg by mouth daily. Reported on 01/15/2016    . budesonide-formoterol (SYMBICORT) 160-4.5 MCG/ACT inhaler Inhale 2 puffs into the lungs 2 (two) times daily. Rinse mouth out 1 Inhaler 12  . celecoxib (CELEBREX) 200 MG capsule Take 1 capsule (200 mg total) by mouth daily after breakfast. 90 capsule 0  . clopidogrel (PLAVIX) 75 MG tablet Take 1 tablet (75 mg total) by mouth daily. Reported on 01/15/2016 90 tablet 3  . cyanocobalamin 1000 MCG tablet Take 1,000 mcg by mouth daily.    Marland Kitchen donepezil (ARICEPT) 5 MG tablet Take 1 tablet (5 mg total) by mouth at bedtime. 90 tablet 3  . FLUoxetine (PROZAC) 20 MG capsule Take 60 mg by mouth daily.  1  . folic acid (FOLVITE) 1 MG tablet TAKE 1 TABLET BY MOUTH EVERY DAY 90 tablet 0  . memantine (NAMENDA) 5 MG tablet Take 1 tablet (5 mg total) by mouth 2 (two) times daily. Taking qd 180 tablet 3  . metoprolol succinate (TOPROL-XL) 25 MG 24 hr  tablet Take 1 tablet (25 mg total) by mouth daily. 90 tablet 3  . Multiple Vitamins-Minerals (MULTIVITAMIN WITH MINERALS) tablet Take 1 tablet by mouth daily.    Marland Kitchen MYRBETRIQ 50 MG TB24 tablet TAKE 1 TABLET BY MOUTH EVERY DAY 90 tablet 0  . omega-3 fish oil (MAXEPA) 1000 MG  CAPS capsule Take 1 capsule by mouth daily.     . pantoprazole (PROTONIX) 40 MG tablet Take 1 tablet (40 mg total) by mouth daily. 30 min before breakfast 90 tablet 3  . rosuvastatin (CRESTOR) 10 MG tablet Take 1 tablet (10 mg total) by mouth at bedtime. 90 tablet 3  . tamsulosin (FLOMAX) 0.4 MG CAPS capsule Take 1 capsule (0.4 mg total) by mouth daily after supper. 90 capsule 3  . traZODone (DESYREL) 150 MG tablet Take 1 tablet (150 mg total) by mouth at bedtime. 90 tablet 3   No current facility-administered medications for this visit.      Review of Systems A complete review of systems was asked and was negative except for the following positive findings urinary frequency with occasional urinary incontinence, shortness of breath.  Weight gain.  Blood pressure 133/81, pulse 81, temperature (!) 96.4 F (35.8 C), temperature source Temporal, resp. rate 16, height 6' (1.829 m), weight 264 lb (119.7 kg), SpO2 96 %.  Physical Exam CONSTITUTIONAL:  Pleasant, well-developed, well-nourished, and in no acute distress. EYES: Pupils equal and reactive to light, Sclera non-icteric EARS, NOSE, MOUTH AND THROAT:  The oropharynx was clear.  Dentition is good repair.  Oral mucosa pink and moist. LYMPH NODES:  Lymph nodes in the neck and axillae were normal RESPIRATORY:  Lungs were clear but distant.  Normal respiratory effort without pathologic use of accessory muscles of respiration CARDIOVASCULAR: Heart was regular without murmurs.  There were no carotid bruits. GI: The abdomen was soft, nontender, and nondistended. There were no palpable masses. There was no hepatosplenomegaly. There were normal bowel sounds in all quadrants. GU:   Rectal deferred.   MUSCULOSKELETAL:  Normal muscle strength and tone.  No clubbing or cyanosis.   SKIN:  There were no pathologic skin lesions.  There were no nodules on palpation. NEUROLOGIC:  Sensation is normal.  Cranial nerves are grossly intact. PSYCH:  Oriented to person, place and time.  Mood and affect are normal.  Data Reviewed Ct scan  I have personally reviewed the patient's imaging, laboratory findings and medical records.    Assessment    There are 2 areas of abnormalities in the lungs.  One is in the left lower lobe that is almost certainly a malignancy.  The other is in the right lower lobe which I think is more likely inflammatory.    Plan    I have recommended to him that we get a complete set of PFTs as well as a PET scan.  He tells me he has an appointment today with our oncologist.  I am sure they would agree with that plan.  We will see him back again once those are completed.       Nestor Lewandowsky, MD 09/02/2019, 8:32 AM

## 2019-09-06 ENCOUNTER — Other Ambulatory Visit: Payer: Self-pay | Admitting: Internal Medicine

## 2019-09-06 ENCOUNTER — Encounter: Payer: Self-pay | Admitting: Oncology

## 2019-09-06 DIAGNOSIS — F17210 Nicotine dependence, cigarettes, uncomplicated: Secondary | ICD-10-CM

## 2019-09-06 MED ORDER — VARENICLINE TARTRATE 1 MG PO TABS: 1.0000 mg | ORAL_TABLET | Freq: Two times a day (BID) | ORAL | 0 refills | Status: DC

## 2019-09-06 MED ORDER — VARENICLINE TARTRATE 0.5 MG PO TABS: 0.5000 mg | ORAL_TABLET | Freq: Two times a day (BID) | ORAL | 0 refills | Status: DC

## 2019-09-06 NOTE — Progress Notes (Signed)
Hematology/Oncology Consult note [facility] Telephone:([phone] [phone]  Patient Care Team: McLean-Scocuzza, [name], MD as PCP - General (Internal Medicine) Telford Nab, RN as Registered Nurse   Name of the patient: Christopher Orr  [id number]  Jan 28, 1946    Reason for referral-lung nodule   Referring physician-Dr. Mclean-Scocuzza  Date of visit: 09/06/19   History of presenting illness- Patient is a 74 year old male with past medical history significant for hypertension, hepatitis C, cirrhosis, COPD among other medical problems.  He is not on any baseline oxygen.  Patient underwent CT chest without contrast which showed a 1 cm left lower lobe pulmonary nodule concerning for bronchogenic carcinoma which was new as compared to his prior scan in 2006.  No thoracic adenopathy.  He has been seen by Dr. Genevive Bi and plan is for PFTs and PET scan followed by discussion for possible surgery patient is a chronic smoker and has smoked 1 pack of cigarettes per day for more than 50 years.  Currently patient reports some fatigue but denies other complaints   ECOG PS- 1  Pain scale- 0   Review of systems- Review of Systems  Constitutional: Positive for malaise/fatigue. Negative for chills, fever and weight loss.  HENT: Negative for congestion, ear discharge and nosebleeds.   Eyes: Negative for blurred vision.  Respiratory: Negative for cough, hemoptysis, sputum production, shortness of breath and wheezing.   Cardiovascular: Negative for chest pain, palpitations, orthopnea and claudication.  Gastrointestinal: Negative for abdominal pain, blood in stool, constipation, diarrhea, heartburn, melena, nausea and vomiting.  Genitourinary: Negative for dysuria, flank pain, frequency, hematuria and urgency.  Musculoskeletal: Negative for back pain, joint pain and myalgias.  Skin: Negative for rash.  Neurological: Negative for dizziness, tingling, focal weakness,  seizures, weakness and headaches.  Endo/Heme/Allergies: Does not bruise/bleed easily.  Psychiatric/Behavioral: Negative for depression and suicidal ideas. The patient does not have insomnia.     No Known Allergies  Patient Active Problem List   Diagnosis Date Noted  . Kidney lesion 08/15/2019  . History of prostate cancer 08/15/2019  . Arthritis 08/15/2019  . Hernia, inguinal, bilateral 08/15/2019  . History of pancreatitis 08/15/2019  . Cirrhosis of liver ([place]) 08/15/2019  . Insomnia 08/15/2019  . Memory impairment 08/15/2019  . Nodule of middle lobe of right lung 08/15/2019  . Tobacco abuse 08/15/2019  . CAD (coronary artery disease)   . Degenerative disc disease, lumbar 11/01/2018  . Cigarette smoker 08/05/2018  . Acute respiratory failure with hypoxia ([place]) 07/06/2018  . Bilateral inguinal hernia, without obstruction or gangrene, recurrent   . Umbilical hernia without obstruction and without gangrene   . Panic attacks 06/14/2018  . Benzodiazepine dependence ([place]) 06/14/2018  . Post traumatic stress disorder (PTSD) 06/14/2018  . Episode of recurrent major depressive disorder ([place]) 06/14/2018  . Coronary artery disease involving native heart with angina pectoris ([place]) 05/31/2018  . Osteoarthritis of multiple joints 05/31/2018  . Hyperlipidemia 05/31/2018  . Chronic viral hepatitis C ([place]) 09/24/2014  . Chest pain 07/28/2014  . COPD (chronic obstructive pulmonary disease) ([place]) 07/28/2014  . Anxiety 07/28/2014  . Aspiration pneumonia ([place]) 07/28/2014  . Elevated troponin I level 07/28/2014  . Acute kidney injury ([place]) 07/28/2014  . GERD (gastroesophageal reflux disease) 07/28/2014     Past Medical History:  Diagnosis Date  . Alcohol abuse    pt states it is marijuana  . Anemia   . Arthritis   . Bipolar disorder ([place])   . BPH (benign prostatic hypertrophy) with  urinary obstruction   . CAD (coronary artery disease)    a. s/p stent to LAD in 2006 at [facility], EF 60% at  that time.  . Cancer [facility])    prostate  . CHF (congestive heart failure) ([place])   . Chronic pancreatitis ([place])   . COPD (chronic obstructive pulmonary disease) ([place])   . Depression   . Diverticulosis   . Drug use   . Dyspnea   . ED (erectile dysfunction)   . Gastritis   . GERD (gastroesophageal reflux disease)   . Headache    occasional migraines  . Hepatitis C    treated  . History of lumbar fusion   . Hyperlipidemia   . Hypertension    patient denies having high blood pressure  . Mitral regurgitation   . Pancreatitis    x 2   . Pneumonia    07/06/18   . PTSD (post-traumatic stress disorder)   . Rib fracture    s/p fall 03/15/19   . Sinus disease   . Urge incontinence      Past Surgical History:  Procedure Laterality Date  . cateract  2013  . CORONARY STENT PLACEMENT  2006   x 1  . ESOPHAGOGASTRODUODENOSCOPY N/A 12/22/2014   Procedure: ESOPHAGOGASTRODUODENOSCOPY (EGD);  Surgeon: [name], MD;  Location: [facility] ENDOSCOPY;  Service: Endoscopy;  Laterality: N/A;  . HERNIA REPAIR  2015   left groin  . LUMBAR FUSION    . RADIOACTIVE SEED IMPLANT N/A 04/22/2016   Procedure: RADIOACTIVE SEED IMPLANT/BRACHYTHERAPY IMPLANT;  Surgeon: Hollice Espy, MD;  Location: [facility] ORS;  Service: Urology;  Laterality: N/A;  . ROBOT ASSISTED INGUINAL HERNIA REPAIR Bilateral 07/06/2018   Procedure: ROBOT ASSISTED INGUINAL HERNIA REPAIR;  Surgeon: Jules Husbands, MD;  Location: [facility] ORS;  Service: General;  Laterality: Bilateral;  . TONSILLECTOMY    . UMBILICAL HERNIA REPAIR N/A 07/06/2018   Procedure: LAPAROSCOPIC ROBOT ASSISTED UMBILICAL HERNIA;  Surgeon: Jules Husbands, MD;  Location: [facility] ORS;  Service: General;  Laterality: N/A;    Social History   Socioeconomic History  . Marital status: Divorced    Spouse name: Not on file  . Number of children: 4  . Years of education: Not on file  . Highest education level: GED or equivalent  Occupational History  . Occupation:  Aeronautical engineer    Comment: retired  Tobacco Use  . Smoking status: Current Every Day Smoker    Packs/day: 1.00    Types: Cigarettes    Start date: 07/28/1962    Last attempt to quit: 10/26/2018    Years since quitting: 0.8  . Smokeless tobacco: Never Used  Substance and Sexual Activity  . Alcohol use: Yes    Alcohol/week: 6.0 standard drinks    Types: 6 Cans of beer per week  . Drug use: Yes    Frequency: 7.0 times per week    Types: Marijuana    Comment: Endorsed heroin 06/2014, UDS also + benzos, opiates, THC, neg for cocaine at that time.  Marland Kitchen Sexual activity: Not Currently  Other Topics Concern  . Not on file  Social History Narrative   Lives at home    Has kids and grandkids   Smoker x 55 years 1 ppd as of 07/2019 he did quit x 2 years    Drinks 1 pt liq qd as of 07/2019    Social Determinants of Health   Financial Resource Strain: Low Risk   . Difficulty of Paying Living Expenses:  Not very hard  Food Insecurity: Unknown  . Worried About Charity fundraiser in the Last Year: Never true  . Ran Out of Food in the Last Year: Not on file  Transportation Needs:   . Lack of Transportation (Medical): Not on file  . Lack of Transportation (Non-Medical): Not on file  Physical Activity:   . Days of Exercise per Week: Not on file  . Minutes of Exercise per Session: Not on file  Stress:   . Feeling of Stress : Not on file  Social Connections:   . Frequency of Communication with Friends and Family: Not on file  . Frequency of Social Gatherings with Friends and Family: Not on file  . Attends Religious Services: Not on file  . Active Member of Clubs or Organizations: Not on file  . Attends Archivist Meetings: Not on file  . Marital Status: Not on file  Intimate Partner Violence:   . Fear of Current or Ex-Partner: Not on file  . Emotionally Abused: Not on file  . Physically Abused: Not on file  . Sexually Abused: Not on file     Family History  Problem Relation  Age of Onset  . Heart disease Father        Unclear details. Father passed in late 64's 2/2 cancer.  . Colon cancer Father   . Alcohol abuse Sister   . Drug abuse Sister   . Anxiety disorder Sister   . Depression Sister   . Kidney disease Neg Hx   . Prostate cancer Neg Hx      Current Outpatient Medications:  .  albuterol (PROVENTIL) (2.5 MG/3ML) 0.083% nebulizer solution, Take 3 mLs (2.5 mg total) by nebulization every 4 (four) hours as needed for wheezing or shortness of breath., Disp: 360 mL, Rfl: 3 .  alprazolam (XANAX) 1 MG tablet, Take 1 tablet (1 mg total) by mouth as directed. 1/2 in am 1 pill qhs, Disp: , Rfl:  .  ARIPiprazole (ABILIFY) 10 MG tablet, Take 10 mg by mouth daily., Disp: , Rfl:  .  aspirin 81 MG tablet, Take 81 mg by mouth daily. Reported on 01/15/2016, Disp: , Rfl:  .  budesonide-formoterol (SYMBICORT) 160-4.5 MCG/ACT inhaler, Inhale 2 puffs into the lungs 2 (two) times daily. Rinse mouth out, Disp: 1 Inhaler, Rfl: 12 .  celecoxib (CELEBREX) 200 MG capsule, Take 1 capsule (200 mg total) by mouth daily after breakfast., Disp: 90 capsule, Rfl: 0 .  clopidogrel (PLAVIX) 75 MG tablet, Take 1 tablet (75 mg total) by mouth daily. Reported on 01/15/2016, Disp: 90 tablet, Rfl: 3 .  cyanocobalamin 1000 MCG tablet, Take 1,000 mcg by mouth daily., Disp: , Rfl:  .  donepezil (ARICEPT) 5 MG tablet, Take 1 tablet (5 mg total) by mouth at bedtime., Disp: 90 tablet, Rfl: 3 .  FLUoxetine (PROZAC) 20 MG capsule, Take 60 mg by mouth daily., Disp: , Rfl: 1 .  folic acid (FOLVITE) 1 MG tablet, TAKE 1 TABLET BY MOUTH EVERY DAY, Disp: 90 tablet, Rfl: 0 .  memantine (NAMENDA) 5 MG tablet, Take 1 tablet (5 mg total) by mouth 2 (two) times daily. Taking qd, Disp: 180 tablet, Rfl: 3 .  metoprolol succinate (TOPROL-XL) 25 MG 24 hr tablet, Take 1 tablet (25 mg total) by mouth daily., Disp: 90 tablet, Rfl: 3 .  Multiple Vitamins-Minerals (MULTIVITAMIN WITH MINERALS) tablet, Take 1 tablet by mouth  daily., Disp: , Rfl:  .  MYRBETRIQ 50 MG TB24 tablet, TAKE 1  TABLET BY MOUTH EVERY DAY, Disp: 90 tablet, Rfl: 0 .  omega-3 fish oil (MAXEPA) 1000 MG CAPS capsule, Take 1 capsule by mouth daily. , Disp: , Rfl:  .  pantoprazole (PROTONIX) 40 MG tablet, Take 1 tablet (40 mg total) by mouth daily. 30 min before breakfast, Disp: 90 tablet, Rfl: 3 .  rosuvastatin (CRESTOR) 10 MG tablet, Take 1 tablet (10 mg total) by mouth at bedtime., Disp: 90 tablet, Rfl: 3 .  tamsulosin (FLOMAX) 0.4 MG CAPS capsule, Take 1 capsule (0.4 mg total) by mouth daily after supper., Disp: 90 capsule, Rfl: 3 .  traZODone (DESYREL) 150 MG tablet, Take 1 tablet (150 mg total) by mouth at bedtime., Disp: 90 tablet, Rfl: 3   Physical exam:  Vitals:   09/02/19 0919  BP: 123/87  Pulse: 75  Resp: 16  Temp: (!) 96.4 F (35.8 C)  TempSrc: Tympanic  SpO2: 96%  Weight: 264 lb (119.7 kg)  Height: 6' (1.829 m)   Physical Exam Constitutional:      General: He is not in acute distress. HENT:     Head: Normocephalic and atraumatic.  Eyes:     Pupils: Pupils are equal, round, and reactive to light.  Cardiovascular:     Rate and Rhythm: Normal rate and regular rhythm.     Heart sounds: Normal heart sounds.  Pulmonary:     Effort: Pulmonary effort is normal.     Breath sounds: Normal breath sounds.  Abdominal:     General: Bowel sounds are normal.     Palpations: Abdomen is soft.  Musculoskeletal:     Cervical back: Normal range of motion.  Skin:    General: Skin is warm and dry.  Neurological:     Mental Status: He is alert and oriented to person, place, and time.        CMP Latest Ref Rng & Units 08/30/2019  Glucose 70 - 99 mg/dL -  BUN 6 - 23 mg/dL -  Creatinine 0.61 - 1.24 mg/dL 1.20  Sodium 135 - 145 mEq/L -  Potassium 3.5 - 5.1 mEq/L -  Chloride 96 - 112 mEq/L -  CO2 19 - 32 mEq/L -  Calcium 8.4 - 10.5 mg/dL -  Total Protein 6.0 - 8.3 g/dL -  Total Bilirubin 0.2 - 1.2 mg/dL -  Alkaline Phos 39 - 117  U/L -  AST 0 - 37 U/L -  ALT 0 - 53 U/L -   CBC Latest Ref Rng & Units 07/08/2018  WBC 4.0 - 10.5 K/uL 10.9(H)  Hemoglobin 13.0 - 17.0 g/dL 12.4(L)  Hematocrit 39.0 - 52.0 % 36.9(L)  Platelets 150.0 - 400.0 K/uL 228.0    No images are attached to the encounter.  CT Chest Wo Contrast  Result Date: 08/30/2019 CLINICAL DATA:  Follow-up of right middle lobe pulmonary nodule on abdominal CT. 55 pack-year smoking history. Hepatitis C and prostate cancer. COPD. EXAM: CT CHEST WITHOUT CONTRAST TECHNIQUE: Multidetector CT imaging of the chest was performed following the standard protocol without IV contrast. COMPARISON:  05/25/2018 abdominopelvic CT. FINDINGS: Cardiovascular: Aortic atherosclerosis. Tortuous thoracic aorta. Normal heart size, without pericardial effusion. Multivessel coronary artery atherosclerosis. Mediastinum/Nodes: No mediastinal or definite hilar adenopathy, given limitations of unenhanced CT. Lungs/Pleura: No pleural fluid. Moderate centrilobular and paraseptal emphysema. The right middle lobe pulmonary nodule is no longer identified. Right base opacities are progressive since 05/25/2018, likely atelectasis or developing scar Since 04/22/2005, development of a pleural-based left lower lobe 1.0 cm nodule on 104/3. Upper  Abdomen: Normal imaged portions of the liver, spleen, stomach, pancreas, gallbladder, adrenal glands. Bilateral fluid density renal lesions are likely cysts. Abdominal aortic atherosclerosis. Musculoskeletal: Nonacute bilateral rib fractures, including seventh and eighth incompletely healed lateral right rib fractures. IMPRESSION: 1. The right middle lobe pulmonary nodule is no longer visualized and has likely resolved. 2. Since 04/22/2005, development of a 1.0 cm left lower lobe pulmonary nodule, for which primary bronchogenic carcinoma cannot be excluded. Consider multidisciplinary thoracic oncology consultation for eventual PET. 3. No thoracic adenopathy. 4. Aortic  atherosclerosis (ICD10-I70.0), coronary artery atherosclerosis and emphysema (ICD10-J43.9). These results will be called to the ordering clinician or representative by the Radiologist Assistant, and communication documented in the PACS or zVision Dashboard. Electronically Signed   By: Abigail Miyamoto M.D.   On: 08/30/2019 13:09   MR ABDOMEN W WO CONTRAST  Result Date: 08/30/2019 CLINICAL DATA:  Follow-up renal lesions on CT EXAM: MRI ABDOMEN WITHOUT AND WITH CONTRAST TECHNIQUE: Multiplanar multisequence MR imaging of the abdomen was performed both before and after the administration of intravenous contrast. CONTRAST:  6mL GADAVIST GADOBUTROL 1 MMOL/ML IV SOLN COMPARISON:  CT abdomen/pelvis dated 05/25/2018 FINDINGS: Lower chest: Lung bases are clear. Hepatobiliary: Scattered hepatic cysts measuring up to 7 mm in the left hepatic lobe (series 4/image 6). No suspicious/enhancing hepatic lesions. Gallbladder is unremarkable. No intrahepatic or extrahepatic ductal dilatation. Pancreas:  Within normal limits. Spleen: 13 mm enhancing lesion in the central spleen (series 12/image 4), favoring a benign hemangioma. Adrenals/Urinary Tract:  Adrenal glands are within normal limits. Multiple bilateral renal cysts, many of which are simple, measuring up to 3.2 cm in the medial right lower kidney (series 4/image 23), benign (Bosniak I). Dominant 4.1 cm cyst in the anterior right lower kidney (series 4/image 26) is notable for layering hemorrhage, benign (Bosniak II). Additional 10 mm lesion in the lateral left lower kidney (series 4/image 24), with intrinsic T1 hyperintensity but no enhancement following contrast administration, compatible with a benign hemorrhagic cyst (Bosniak II). A 15 mm complex cystic lesion in the anterior right lower kidney (series 4/image 19) is notable for a single thin septation and lateral mural nodule with intrinsic T1 hyperintensity/hemorrhage (series 11/image 45). This previously measured 2.3 cm on  CT and therefore favors retracted clot within a complex cyst. No definite enhancement, although this is difficult to entirely exclude given motion. Although this is favored to be benign, this is considered a Bosniak IIF lesion, warranting at least one additional follow-up. No hydronephrosis. Stomach/Bowel: Stomach is within normal limits. Visualized bowel is notable for scattered colonic diverticulosis, without evidence of diverticulitis. Normal appendix (series 4/image 32). Vascular/Lymphatic:  No evidence of abdominal aortic aneurysm. No suspicious abdominal lymphadenopathy. Other:  No abdominal ascites. Musculoskeletal: Status post PLIF at L4-5. Degenerative changes of the lumbar spine. IMPRESSION: 15 mm complex cystic lesion with hemorrhagic mural nodule in the anterior right lower kidney, previously 2.3 cm. While favored to be benign, this is considered a Bosniak IIF lesion, warranting at least one additional follow-up. Given low risk, follow-up MRI abdomen with/without contrast in 1 year would likely suffice. Additional benign simple and hemorrhagic cysts bilaterally, as described above (Bosniak I-II). Electronically Signed   By: Julian Hy M.D.   On: 08/30/2019 10:54    Assessment and plan- Patient is a 74 y.o. male with newly diagnosed left lower lobe lung nodule 1 cm concerning for bronchogenic carcinoma  I agree with recommendation from Dr. Rico Junker that patient needs PET CT scan as the first step to  a certain of this nodule is hypermetabolic.  Following this patient will need to discuss with Dr. Genevive Bi again if he would like to proceed with definitive surgery.  If patient decides against surgery then the next step would be CT-guided lung biopsy for tissue diagnosis before considering SBRT to this lesion if this all turns out to be lung cancer.  If there is no evidence of thoracic adenopathy in the mass is less than 4 cm there would be no role for systemic chemotherapy  At this time I will  await PET CT scan and further recommendations from Dr. Genevive Bi before deciding about biopsy and radiation oncology referral   Thank you for this kind referral and the opportunity to participate in the care of this patient   Visit Diagnosis 1. Lung nodule     Dr. Randa Evens, MD, MPH [facility] [phone] 09/06/2019  11:51 AM

## 2019-09-07 ENCOUNTER — Ambulatory Visit
Admission: RE | Admit: 2019-09-07 | Discharge: 2019-09-07 | Disposition: A | Discharge status: Home / Self Care | Payer: Medicare HMO | Source: Ambulatory Visit | Attending: Internal Medicine | Admitting: Internal Medicine

## 2019-09-07 ENCOUNTER — Other Ambulatory Visit
Admission: RE | Admit: 2019-09-07 | Discharge: 2019-09-07 | Disposition: A | Discharge status: Home / Self Care | Payer: Medicare HMO | Source: Ambulatory Visit | Attending: Internal Medicine | Admitting: Internal Medicine

## 2019-09-07 ENCOUNTER — Other Ambulatory Visit: Payer: Medicare HMO

## 2019-09-07 ENCOUNTER — Other Ambulatory Visit: Payer: Self-pay

## 2019-09-07 DIAGNOSIS — Z8546 Personal history of malignant neoplasm of prostate: Secondary | ICD-10-CM | POA: Insufficient documentation

## 2019-09-07 DIAGNOSIS — Z20822 Contact with and (suspected) exposure to covid-19: Secondary | ICD-10-CM | POA: Insufficient documentation

## 2019-09-07 DIAGNOSIS — J439 Emphysema, unspecified: Secondary | ICD-10-CM | POA: Insufficient documentation

## 2019-09-07 DIAGNOSIS — C3492 Malignant neoplasm of unspecified part of left bronchus or lung: Secondary | ICD-10-CM | POA: Diagnosis not present

## 2019-09-07 DIAGNOSIS — R911 Solitary pulmonary nodule: Secondary | ICD-10-CM | POA: Diagnosis not present

## 2019-09-07 DIAGNOSIS — I700 Atherosclerosis of aorta: Secondary | ICD-10-CM | POA: Insufficient documentation

## 2019-09-07 DIAGNOSIS — I2510 Atherosclerotic heart disease of native coronary artery without angina pectoris: Secondary | ICD-10-CM | POA: Diagnosis not present

## 2019-09-07 DIAGNOSIS — Z01818 Encounter for other preprocedural examination: Secondary | ICD-10-CM | POA: Insufficient documentation

## 2019-09-07 LAB — SARS CORONAVIRUS 2 (TAT 6-24 HRS): SARS Coronavirus 2: NEGATIVE

## 2019-09-07 LAB — GLUCOSE, CAPILLARY: Glucose-Capillary: 106 mg/dL — ABNORMAL HIGH (ref 70–99)

## 2019-09-07 MED ORDER — FLUDEOXYGLUCOSE F - 18 (FDG) INJECTION
13.7000 | Freq: Once | INTRAVENOUS | Status: AC | PRN
  Administered 2019-09-07: 09:00:00 13.96 via INTRAVENOUS

## 2019-09-08 ENCOUNTER — Telehealth: Payer: Self-pay

## 2019-09-08 ENCOUNTER — Ambulatory Visit: Payer: Medicare HMO | Attending: Cardiothoracic Surgery

## 2019-09-08 ENCOUNTER — Other Ambulatory Visit: Payer: Self-pay | Admitting: Cardiothoracic Surgery

## 2019-09-08 DIAGNOSIS — R918 Other nonspecific abnormal finding of lung field: Secondary | ICD-10-CM | POA: Diagnosis not present

## 2019-09-08 MED ORDER — ALBUTEROL SULFATE (2.5 MG/3ML) 0.083% IN NEBU
2.5000 mg | INHALATION_SOLUTION | Freq: Once | RESPIRATORY_TRACT | Status: AC
  Administered 2019-09-08: 18:00:00 2.5 mg via RESPIRATORY_TRACT
  Filled 2019-09-08: qty 3

## 2019-09-08 NOTE — Telephone Encounter (Signed)
Patient PFT rescheduled today at 5:15 pm. Reminded of appointment with Dr.Oaks 09/09/19 at 11 am.

## 2019-09-09 ENCOUNTER — Ambulatory Visit: Payer: Medicare HMO | Admitting: Cardiothoracic Surgery

## 2019-09-09 ENCOUNTER — Encounter: Payer: Self-pay | Admitting: Cardiothoracic Surgery

## 2019-09-09 ENCOUNTER — Other Ambulatory Visit: Payer: Self-pay

## 2019-09-09 VITALS — BP 131/78 | HR 76 | Temp 97.9°F | Resp 12 | Ht 72.0 in | Wt 264.4 lb

## 2019-09-09 DIAGNOSIS — K118 Other diseases of salivary glands: Secondary | ICD-10-CM | POA: Diagnosis not present

## 2019-09-09 DIAGNOSIS — R918 Other nonspecific abnormal finding of lung field: Secondary | ICD-10-CM | POA: Diagnosis not present

## 2019-09-09 DIAGNOSIS — K6289 Other specified diseases of anus and rectum: Secondary | ICD-10-CM | POA: Diagnosis not present

## 2019-09-09 LAB — BLOOD GAS, ARTERIAL
Acid-Base Excess: 1 mmol/L (ref 0.0–2.0)
Bicarbonate: 23.8 mmol/L (ref 20.0–28.0)
FIO2: 0.21
O2 Saturation: 96.7 %
pCO2 arterial: 35 mmHg (ref 32.0–48.0)
pH, Arterial: 7.44 (ref 7.350–7.450)
pO2, Arterial: 84 mmHg (ref 83.0–108.0)

## 2019-09-09 NOTE — Progress Notes (Signed)
  Patient ID: Christopher Orr, male   DOB: 12-10-1945, 74 y.o.   MRN: [id number]  HISTORY: Christopher Orr returns today in follow-up.  He still has a chronic cough.  He had his PET scan done as well as his PFTs.  His PFTs reveal an FEV1 of approximately 75% and a DLCO of approximately 55%.  Vitals:   09/09/19 1108  BP: 131/78  Pulse: 76  Resp: 12  Temp: 97.9 F (36.6 C)  SpO2: 93%     EXAM:    Resp: Lungs are clear bilaterally.  No respiratory distress, normal effort. Heart:  Regular without murmurs Abd:  Abdomen is soft, non distended and non tender. No masses are palpable.  There is no rebound and no guarding.  Neurological: Alert and oriented to person, place, and time. Coordination normal.  Skin: Skin is warm and dry. No rash noted. No diaphoretic. No erythema. No pallor.  Psychiatric: Normal mood and affect. Normal behavior. Judgment and thought content normal.    ASSESSMENT: I have independently reviewed the patient's PET scan and pull [facility] function test.  The PET scan reveals uptake in the left parotid.  There is also uptake in the left lung nodule.  There is uptake in the anorectal region.   PLAN:   I had a long discussion today with he and his daughter.  I do think that we should get ENT to see the patient as well as GI for consideration of endoscopy.  Once those are complete we can then discuss lung biopsy.  He will follow-up with me in 2 weeks and he will also continue his involvement with our oncologist as well.    Nestor Lewandowsky, MD

## 2019-09-09 NOTE — Patient Instructions (Addendum)
We have sent a referral to ENT for the Parotid mass to [facility]. They will contact you to schedule this visit.  We have sent a referral to Gastroenterology for the anal/rectal lesion to [facility]. They will contact you to schedule this visit and get you set up for a Colonoscopy.   We will have you follow up with [place] after you have been seen by both of these providers.

## 2019-09-12 ENCOUNTER — Encounter: Payer: Self-pay | Admitting: *Deleted

## 2019-09-12 ENCOUNTER — Other Ambulatory Visit: Payer: Self-pay

## 2019-09-13 ENCOUNTER — Ambulatory Visit: Payer: Medicare HMO | Admitting: Gastroenterology

## 2019-09-13 ENCOUNTER — Other Ambulatory Visit: Payer: Self-pay

## 2019-09-13 ENCOUNTER — Ambulatory Visit: Payer: Medicare HMO

## 2019-09-13 ENCOUNTER — Encounter: Payer: Self-pay | Admitting: Gastroenterology

## 2019-09-13 VITALS — BP 111/67 | HR 80 | Temp 98.3°F | Ht 72.0 in | Wt 266.8 lb

## 2019-09-13 DIAGNOSIS — R948 Abnormal results of function studies of other organs and systems: Secondary | ICD-10-CM

## 2019-09-13 DIAGNOSIS — J449 Chronic obstructive pulmonary disease, unspecified: Secondary | ICD-10-CM

## 2019-09-13 DIAGNOSIS — K7460 Unspecified cirrhosis of liver: Secondary | ICD-10-CM | POA: Diagnosis not present

## 2019-09-13 MED ORDER — ALBUTEROL SULFATE (2.5 MG/3ML) 0.083% IN NEBU: 2.5000 mg | INHALATION_SOLUTION | RESPIRATORY_TRACT | 3 refills | Status: DC | PRN

## 2019-09-13 NOTE — H&P (View-Only) (Signed)
Gastroenterology Consultation  Referring Provider:     Nestor Lewandowsky, MD Primary Care Physician:  McLean-Scocuzza, [name], MD Primary Gastroenterologist:  Dr. Allen Norris     Reason for Consultation:     Abnormal PET scan and cirrhosis        HPI:   Christopher Orr is a 74 y.o. y/o male referred for consultation & management of abnormal PET scan and cirrhosis by Dr. Terese Door, [name], MD.  This patient comes in today after being seen in the past by Dr. Rayann Heman who has done his EGDs due to a history of cirrhosis.  The patient recently had a PET scan done for a pulmonary nodule and it showed:  IMPRESSION: 1. The left lower lobe pulmonary nodule is mildly hypermetabolic, suspicious for bronchogenic carcinoma or a solitary metastasis. No other suspicious pulmonary nodules. 2. No definite mediastinal adenopathy. Nonspecific low-level hilar activity bilaterally, possibly reactive. 3. Hypermetabolic left parotid mass, likely an incidental benign neoplasm such as Warthin's tumor. ENT evaluation recommended. 4. Indeterminate hypermetabolic activity at the anus, possibly physiologic. This appears unrelated to the prostate gland which appears normal post brachytherapy. Correlation with digital rectal exam recommended. 5. Coronary and Aortic Atherosclerosis (ICD10-I70.0). Emphysema  Upon seeing this the patient was made an appointment with GI for further evaluation of the hyper metabolic activity at the anus and with ENT for a left parotid hypermetabolic foci.  The patient's liver enzymes were normal back in August 2020.  It appears that the patient had a positive hepatitis C viral load in February of 2016 with resolution by 06-13-2015. His father died of colon cancer at 71. The patient denies having a colonoscopy in the recent past.  The patient does voice that there may be some concern that the spot on his lung could be a metastatic lesion from his colon.  Past Medical History:  Diagnosis Date  .  Alcohol abuse    pt states it is marijuana  . Anemia   . Arthritis   . Bipolar disorder ([place])   . BPH (benign prostatic hypertrophy) with urinary obstruction   . CAD (coronary artery disease)    a. s/p stent to LAD in 2006 at [facility], EF 60% at that time.  . Cancer [facility])    prostate  . CHF (congestive heart failure) ([place])   . Chronic pancreatitis ([place])   . COPD (chronic obstructive pulmonary disease) ([place])   . Depression   . Diverticulosis   . Drug use   . Dyspnea   . ED (erectile dysfunction)   . Gastritis   . GERD (gastroesophageal reflux disease)   . Headache    occasional migraines  . Hepatitis C    treated  . History of lumbar fusion   . Hyperlipidemia   . Hypertension    patient denies having high blood pressure  . Mitral regurgitation   . Pancreatitis    x 2   . Pneumonia    07/06/18   . PTSD (post-traumatic stress disorder)   . Rib fracture    s/p fall 03/15/19   . Sinus disease   . Urge incontinence     Past Surgical History:  Procedure Laterality Date  . cateract  2013  . CORONARY STENT PLACEMENT  2006   x 1  . ESOPHAGOGASTRODUODENOSCOPY N/A 12/22/2014   Procedure: ESOPHAGOGASTRODUODENOSCOPY (EGD);  Surgeon: [name], MD;  Location: [facility] ENDOSCOPY;  Service: Endoscopy;  Laterality: N/A;  . HERNIA REPAIR  2015   left groin  .  LUMBAR FUSION    . RADIOACTIVE SEED IMPLANT N/A 04/22/2016   Procedure: RADIOACTIVE SEED IMPLANT/BRACHYTHERAPY IMPLANT;  Surgeon: Hollice Espy, MD;  Location: [facility] ORS;  Service: Urology;  Laterality: N/A;  . ROBOT ASSISTED INGUINAL HERNIA REPAIR Bilateral 07/06/2018   Procedure: ROBOT ASSISTED INGUINAL HERNIA REPAIR;  Surgeon: Jules Husbands, MD;  Location: [facility] ORS;  Service: General;  Laterality: Bilateral;  . TONSILLECTOMY    . UMBILICAL HERNIA REPAIR N/A 07/06/2018   Procedure: LAPAROSCOPIC ROBOT ASSISTED UMBILICAL HERNIA;  Surgeon: Jules Husbands, MD;  Location: [facility] ORS;  Service: General;  Laterality: N/A;     Prior to Admission medications   Medication Sig Start Date End Date Taking? Authorizing Provider  albuterol (PROVENTIL) (2.5 MG/3ML) 0.083% nebulizer solution Take 3 mLs (2.5 mg total) by nebulization every 4 (four) hours as needed for wheezing or shortness of breath. 08/15/19   McLean-Scocuzza, [name], MD  alprazolam Duanne Moron) 1 MG tablet Take 1 tablet (1 mg total) by mouth as directed. 1/2 in am 1 pill qhs 08/12/19   McLean-Scocuzza, [name], MD  ARIPiprazole (ABILIFY) 10 MG tablet Take 10 mg by mouth daily.    [provider]  aspirin 81 MG tablet Take 81 mg by mouth daily. Reported on 01/15/2016    [provider]  budesonide-formoterol (SYMBICORT) 160-4.5 MCG/ACT inhaler Inhale 2 puffs into the lungs 2 (two) times daily. Rinse mouth out 08/15/19   McLean-Scocuzza, [name], MD  celecoxib (CELEBREX) 200 MG capsule Take 1 capsule (200 mg total) by mouth daily after breakfast. 08/15/19   McLean-Scocuzza, [name], MD  clopidogrel (PLAVIX) 75 MG tablet Take 1 tablet (75 mg total) by mouth daily. Reported on 01/15/2016 08/15/19   McLean-Scocuzza, [name], MD  cyanocobalamin 1000 MCG tablet Take 1,000 mcg by mouth daily.    [provider]  donepezil (ARICEPT) 5 MG tablet Take 1 tablet (5 mg total) by mouth at bedtime. 08/15/19   McLean-Scocuzza, [name], MD  FLUoxetine (PROZAC) 20 MG capsule Take 60 mg by mouth daily. 06/14/18   [provider]  folic acid (FOLVITE) 1 MG tablet TAKE 1 TABLET BY MOUTH EVERY DAY 04/13/19   Guse, Jacquelynn Cree, FNP  memantine (NAMENDA) 5 MG tablet Take 1 tablet (5 mg total) by mouth 2 (two) times daily. Taking qd 08/15/19   McLean-Scocuzza, [name], MD  metoprolol succinate (TOPROL-XL) 25 MG 24 hr tablet Take 1 tablet (25 mg total) by mouth daily. 08/15/19   McLean-Scocuzza, [name], MD  Multiple Vitamins-Minerals (MULTIVITAMIN WITH MINERALS) tablet Take 1 tablet by mouth daily.    [provider]  MYRBETRIQ 50 MG TB24 tablet TAKE 1  TABLET BY MOUTH EVERY DAY 08/18/19   Hollice Espy, MD  omega-3 fish oil (MAXEPA) 1000 MG CAPS capsule Take 1 capsule by mouth daily.  05/08/18   [provider]  pantoprazole (PROTONIX) 40 MG tablet Take 1 tablet (40 mg total) by mouth daily. 30 min before breakfast 08/15/19   McLean-Scocuzza, [name], MD  rosuvastatin (CRESTOR) 10 MG tablet Take 1 tablet (10 mg total) by mouth at bedtime. 08/15/19   McLean-Scocuzza, [name], MD  tamsulosin (FLOMAX) 0.4 MG CAPS capsule Take 1 capsule (0.4 mg total) by mouth daily after supper. 08/15/19   McLean-Scocuzza, [name], MD  traZODone (DESYREL) 150 MG tablet Take 1 tablet (150 mg total) by mouth at bedtime. 08/15/19   McLean-Scocuzza, [name], MD  varenicline (CHANTIX) 0.5 MG tablet Take 1 tablet (0.5 mg total) by  mouth 2 (two) times daily. 09/06/19   McLean-Scocuzza, [name], MD    Family History  Problem Relation Age of Onset  . Heart disease Father        Unclear details. Father passed in late 58's 2/2 cancer.  . Colon cancer Father   . Alcohol abuse Sister   . Drug abuse Sister   . Anxiety disorder Sister   . Depression Sister   . Kidney disease Neg Hx   . Prostate cancer Neg Hx      Social History   Tobacco Use  . Smoking status: Former Smoker    Types: Cigarettes    Start date: 07/28/1962    Quit date: 09/01/2019    Years since quitting: 0.0  . Smokeless tobacco: Never Used  Substance Use Topics  . Alcohol use: Yes    Alcohol/week: 6.0 standard drinks    Types: 6 Cans of beer per week  . Drug use: Yes    Frequency: 7.0 times per week    Types: Marijuana    Comment: Endorsed heroin 06/2014, UDS also + benzos, opiates, THC, neg for cocaine at that time.    Allergies as of 09/13/2019  . (No Known Allergies)    Review of Systems:    All systems reviewed and negative except where noted in HPI.   Physical Exam:  There were no vitals taken for this visit. No LMP for male patient. General:   Alert,  Well-developed,  well-nourished, pleasant and cooperative in NAD Head:  Normocephalic and atraumatic. Eyes:  Sclera clear, no icterus.   Conjunctiva pink. Ears:  Normal auditory acuity. Neck:  Supple; no masses or thyromegaly. Lungs: Decreased breath sounds bilaterally without any crackles or rhonchi.. Heart:  Regular rate and rhythm; no murmurs, clicks, rubs, or gallops. Abdomen:  Normal bowel sounds.  No bruits.  Soft, non-tender and non-distended without masses, hepatosplenomegaly or hernias noted.  No guarding or rebound tenderness.  Negative Carnett sign.   Rectal:  Deferred.  Pulses:  Normal pulses noted. Extremities:  No clubbing or edema.  No cyanosis. Neurologic:  Alert and oriented x3;  grossly normal neurologically. Skin:  Intact without significant lesions or rashes.  No jaundice. Lymph Nodes:  No significant cervical adenopathy. Psych:  Alert and cooperative. Normal mood and affect.  Imaging Studies: CT Chest Wo Contrast  Result Date: 08/30/2019 CLINICAL DATA:  Follow-up of right middle lobe pulmonary nodule on abdominal CT. 55 pack-year smoking history. Hepatitis C and prostate cancer. COPD. EXAM: CT CHEST WITHOUT CONTRAST TECHNIQUE: Multidetector CT imaging of the chest was performed following the standard protocol without IV contrast. COMPARISON:  05/25/2018 abdominopelvic CT. FINDINGS: Cardiovascular: Aortic atherosclerosis. Tortuous thoracic aorta. Normal heart size, without pericardial effusion. Multivessel coronary artery atherosclerosis. Mediastinum/Nodes: No mediastinal or definite hilar adenopathy, given limitations of unenhanced CT. Lungs/Pleura: No pleural fluid. Moderate centrilobular and paraseptal emphysema. The right middle lobe pulmonary nodule is no longer identified. Right base opacities are progressive since 05/25/2018, likely atelectasis or developing scar Since 04/22/2005, development of a pleural-based left lower lobe 1.0 cm nodule on 104/3. Upper Abdomen: Normal imaged portions  of the liver, spleen, stomach, pancreas, gallbladder, adrenal glands. Bilateral fluid density renal lesions are likely cysts. Abdominal aortic atherosclerosis. Musculoskeletal: Nonacute bilateral rib fractures, including seventh and eighth incompletely healed lateral right rib fractures. IMPRESSION: 1. The right middle lobe pulmonary nodule is no longer visualized and has likely resolved. 2. Since 04/22/2005, development of a 1.0 cm left lower lobe pulmonary nodule, for which primary  bronchogenic carcinoma cannot be excluded. Consider multidisciplinary thoracic oncology consultation for eventual PET. 3. No thoracic adenopathy. 4. Aortic atherosclerosis (ICD10-I70.0), coronary artery atherosclerosis and emphysema (ICD10-J43.9). These results will be called to the ordering clinician or representative by the Radiologist Assistant, and communication documented in the PACS or zVision Dashboard. Electronically Signed   By: Abigail Miyamoto M.D.   On: 08/30/2019 13:09   MR ABDOMEN W WO CONTRAST  Result Date: 08/30/2019 CLINICAL DATA:  Follow-up renal lesions on CT EXAM: MRI ABDOMEN WITHOUT AND WITH CONTRAST TECHNIQUE: Multiplanar multisequence MR imaging of the abdomen was performed both before and after the administration of intravenous contrast. CONTRAST:  65mL GADAVIST GADOBUTROL 1 MMOL/ML IV SOLN COMPARISON:  CT abdomen/pelvis dated 05/25/2018 FINDINGS: Lower chest: Lung bases are clear. Hepatobiliary: Scattered hepatic cysts measuring up to 7 mm in the left hepatic lobe (series 4/image 6). No suspicious/enhancing hepatic lesions. Gallbladder is unremarkable. No intrahepatic or extrahepatic ductal dilatation. Pancreas:  Within normal limits. Spleen: 13 mm enhancing lesion in the central spleen (series 12/image 4), favoring a benign hemangioma. Adrenals/Urinary Tract:  Adrenal glands are within normal limits. Multiple bilateral renal cysts, many of which are simple, measuring up to 3.2 cm in the medial right lower kidney  (series 4/image 23), benign (Bosniak I). Dominant 4.1 cm cyst in the anterior right lower kidney (series 4/image 26) is notable for layering hemorrhage, benign (Bosniak II). Additional 10 mm lesion in the lateral left lower kidney (series 4/image 24), with intrinsic T1 hyperintensity but no enhancement following contrast administration, compatible with a benign hemorrhagic cyst (Bosniak II). A 15 mm complex cystic lesion in the anterior right lower kidney (series 4/image 19) is notable for a single thin septation and lateral mural nodule with intrinsic T1 hyperintensity/hemorrhage (series 11/image 45). This previously measured 2.3 cm on CT and therefore favors retracted clot within a complex cyst. No definite enhancement, although this is difficult to entirely exclude given motion. Although this is favored to be benign, this is considered a Bosniak IIF lesion, warranting at least one additional follow-up. No hydronephrosis. Stomach/Bowel: Stomach is within normal limits. Visualized bowel is notable for scattered colonic diverticulosis, without evidence of diverticulitis. Normal appendix (series 4/image 32). Vascular/Lymphatic:  No evidence of abdominal aortic aneurysm. No suspicious abdominal lymphadenopathy. Other:  No abdominal ascites. Musculoskeletal: Status post PLIF at L4-5. Degenerative changes of the lumbar spine. IMPRESSION: 15 mm complex cystic lesion with hemorrhagic mural nodule in the anterior right lower kidney, previously 2.3 cm. While favored to be benign, this is considered a Bosniak IIF lesion, warranting at least one additional follow-up. Given low risk, follow-up MRI abdomen with/without contrast in 1 year would likely suffice. Additional benign simple and hemorrhagic cysts bilaterally, as described above (Bosniak I-II). Electronically Signed   By: Julian Hy M.D.   On: 08/30/2019 10:54   NM PET Image Initial (PI) Skull Base To Thigh  Result Date: 09/07/2019 CLINICAL DATA:  Initial  treatment strategy for left lower lobe pulmonary nodule on chest CT. History of prostate cancer. Risk factors for lung cancer. EXAM: NUCLEAR MEDICINE PET SKULL BASE TO THIGH TECHNIQUE: 13.96 mCi F-18 FDG was injected intravenously. Full-ring PET imaging was performed from the skull base to thigh after the radiotracer. CT data was obtained and used for attenuation correction and anatomic localization. Fasting blood glucose: 106 mg/dl COMPARISON:  Chest CT 08/30/2019, abdominal CT 05/25/2018 and abdominal MRI 08/30/2019. FINDINGS: Mediastinal blood pool activity: SUV max 2.5 NECK: No hypermetabolic cervical lymph nodes are identified.There are no  lesions of the pharyngeal mucosal space. There is a hypermetabolic lesion within the superficial portion of the left parotid gland, measuring 1.7 x 2.0 cm on image 38/3. This has an SUV max of 6.2. Incidental CT findings: Bilateral carotid atherosclerosis. CHEST: The subpleural nodule of concern posteriorly in the left lower lobe is mildly hypermetabolic (SUV max 2.7). This nodule measures 1.2 x 1.2 cm on image 117/3. No other hypermetabolic pulmonary activity or suspicious nodularity. There is low-level hilar activity bilaterally without discrete adenopathy. This has an SUV max of 4.6 on the left and 3.3 on the right. No hypermetabolic mediastinal or axillary adenopathy. Incidental CT findings: Three-vessel coronary artery atherosclerosis with lesser involvement of the aorta and great vessels. Mild to moderate centrilobular and paraseptal emphysema with further improvement in the right basilar aeration. ABDOMEN/PELVIS: There is no hypermetabolic activity within the liver, adrenal glands, spleen or pancreas. There is no hypermetabolic nodal activity. There is hypermetabolic activity associated with the anise (SUV max 8.7). No associated focal mass lesion or bowel obstruction. No abnormal metabolic activity in the prostate gland post brachytherapy. Incidental CT findings:  Grossly stable renal cysts without hypermetabolic activity. Aortic and branch vessel atherosclerosis. Postsurgical changes in the left inguinal region. SKELETON: There is no hypermetabolic activity to suggest osseous metastatic disease. Incidental CT findings: Previous L4-5 fusion with adjacent segment disease at L3-4. IMPRESSION: 1. The left lower lobe pulmonary nodule is mildly hypermetabolic, suspicious for bronchogenic carcinoma or a solitary metastasis. No other suspicious pulmonary nodules. 2. No definite mediastinal adenopathy. Nonspecific low-level hilar activity bilaterally, possibly reactive. 3. Hypermetabolic left parotid mass, likely an incidental benign neoplasm such as Warthin's tumor. ENT evaluation recommended. 4. Indeterminate hypermetabolic activity at the anus, possibly physiologic. This appears unrelated to the prostate gland which appears normal post brachytherapy. Correlation with digital rectal exam recommended. 5. Coronary and Aortic Atherosclerosis (ICD10-I70.0). Emphysema (ICD10-J43.9). Electronically Signed   By: Richardean Sale M.D.   On: 09/07/2019 11:23   Pulmonary Function Test [facility] Only  Result Date: 09/09/2019 Spirometry Data Is Acceptable and Reproducible Moderate Obstructive Airways Disease withOUT Significant Broncho-Dilator Response Consider outpatient Pulmonary Consultation if needed Clinical Correlation Advised    Assessment and Plan:   Christopher Orr is a 74 y.o. y/o male who comes in today with a abnormal PET scan and a father with colon cancer who died at 76.  The patient has not had a colonoscopy in many years.  The patient also has hepatitis C induced cirrhosis with sustained viral response after treatment in 2016.  The patient has not had an EGD in many years and will need an EGD to rule out varices.  The patient has been explained the EGD and colonoscopy and agrees with the plan.  The patient will be set up at the next available appointment.    Lucilla Lame,  MD. Marval Regal    Note: This dictation was prepared with Dragon dictation along with smaller phrase technology. Any transcriptional errors that result from this process are unintentional.

## 2019-09-13 NOTE — Progress Notes (Signed)
Gastroenterology Consultation  Referring Provider:     Nestor Lewandowsky, MD Primary Care Physician:  McLean-Scocuzza, [name], MD Primary Gastroenterologist:  Dr. Allen Norris     Reason for Consultation:     Abnormal PET scan and cirrhosis        HPI:   Christopher Orr is a 74 y.o. y/o male referred for consultation & management of abnormal PET scan and cirrhosis by Dr. Terese Door, [name], MD.  This patient comes in today after being seen in the past by Dr. Rayann Heman who has done his EGDs due to a history of cirrhosis.  The patient recently had a PET scan done for a pulmonary nodule and it showed:  IMPRESSION: 1. The left lower lobe pulmonary nodule is mildly hypermetabolic, suspicious for bronchogenic carcinoma or a solitary metastasis. No other suspicious pulmonary nodules. 2. No definite mediastinal adenopathy. Nonspecific low-level hilar activity bilaterally, possibly reactive. 3. Hypermetabolic left parotid mass, likely an incidental benign neoplasm such as Warthin's tumor. ENT evaluation recommended. 4. Indeterminate hypermetabolic activity at the anus, possibly physiologic. This appears unrelated to the prostate gland which appears normal post brachytherapy. Correlation with digital rectal exam recommended. 5. Coronary and Aortic Atherosclerosis (ICD10-I70.0). Emphysema  Upon seeing this the patient was made an appointment with GI for further evaluation of the hyper metabolic activity at the anus and with ENT for a left parotid hypermetabolic foci.  The patient's liver enzymes were normal back in August 2020.  It appears that the patient had a positive hepatitis C viral load in February of 2016 with resolution by Jun 28, 2015. His father died of colon cancer at 56. The patient denies having a colonoscopy in the recent past.  The patient does voice that there may be some concern that the spot on his lung could be a metastatic lesion from his colon.  Past Medical History:  Diagnosis Date  .  Alcohol abuse    pt states it is marijuana  . Anemia   . Arthritis   . Bipolar disorder ([place])   . BPH (benign prostatic hypertrophy) with urinary obstruction   . CAD (coronary artery disease)    a. s/p stent to LAD in 2006 at [facility], EF 60% at that time.  . [facility])    prostate  . CHF (congestive heart failure) ([place])   . Chronic pancreatitis ([place])   . COPD (chronic obstructive pulmonary disease) ([place])   . Depression   . Diverticulosis   . Drug use   . Dyspnea   . ED (erectile dysfunction)   . Gastritis   . GERD (gastroesophageal reflux disease)   . Headache    occasional migraines  . Hepatitis C    treated  . History of lumbar fusion   . Hyperlipidemia   . Hypertension    patient denies having high blood pressure  . Mitral regurgitation   . Pancreatitis    x 2   . Pneumonia    07/06/18   . PTSD (post-traumatic stress disorder)   . Rib fracture    s/p fall 03/15/19   . Sinus disease   . Urge incontinence     Past Surgical History:  Procedure Laterality Date  . cateract  2013  . CORONARY STENT PLACEMENT  2006   x 1  . ESOPHAGOGASTRODUODENOSCOPY N/A 12/22/2014   Procedure: ESOPHAGOGASTRODUODENOSCOPY (EGD);  Surgeon: [name], MD;  Location: [facility] ENDOSCOPY;  Service: Endoscopy;  Laterality: N/A;  . HERNIA REPAIR  2015   left groin  .  LUMBAR FUSION    . RADIOACTIVE SEED IMPLANT N/A 04/22/2016   Procedure: RADIOACTIVE SEED IMPLANT/BRACHYTHERAPY IMPLANT;  Surgeon: Hollice Espy, MD;  Location: [facility] ORS;  Service: Urology;  Laterality: N/A;  . ROBOT ASSISTED INGUINAL HERNIA REPAIR Bilateral 07/06/2018   Procedure: ROBOT ASSISTED INGUINAL HERNIA REPAIR;  Surgeon: Jules Husbands, MD;  Location: [facility] ORS;  Service: General;  Laterality: Bilateral;  . TONSILLECTOMY    . UMBILICAL HERNIA REPAIR N/A 07/06/2018   Procedure: LAPAROSCOPIC ROBOT ASSISTED UMBILICAL HERNIA;  Surgeon: Jules Husbands, MD;  Location: [facility] ORS;  Service: General;  Laterality: N/A;     Prior to Admission medications   Medication Sig Start Date End Date Taking? Authorizing Provider  albuterol (PROVENTIL) (2.5 MG/3ML) 0.083% nebulizer solution Take 3 mLs (2.5 mg total) by nebulization every 4 (four) hours as needed for wheezing or shortness of breath. 08/15/19   McLean-Scocuzza, [name], MD  alprazolam Duanne Moron) 1 MG tablet Take 1 tablet (1 mg total) by mouth as directed. 1/2 in am 1 pill qhs 08/12/19   McLean-Scocuzza, [name], MD  ARIPiprazole (ABILIFY) 10 MG tablet Take 10 mg by mouth daily.    [provider]  aspirin 81 MG tablet Take 81 mg by mouth daily. Reported on 01/15/2016    [provider]  budesonide-formoterol (SYMBICORT) 160-4.5 MCG/ACT inhaler Inhale 2 puffs into the lungs 2 (two) times daily. Rinse mouth out 08/15/19   McLean-Scocuzza, [name], MD  celecoxib (CELEBREX) 200 MG capsule Take 1 capsule (200 mg total) by mouth daily after breakfast. 08/15/19   McLean-Scocuzza, [name], MD  clopidogrel (PLAVIX) 75 MG tablet Take 1 tablet (75 mg total) by mouth daily. Reported on 01/15/2016 08/15/19   McLean-Scocuzza, [name], MD  cyanocobalamin 1000 MCG tablet Take 1,000 mcg by mouth daily.    [provider]  donepezil (ARICEPT) 5 MG tablet Take 1 tablet (5 mg total) by mouth at bedtime. 08/15/19   McLean-Scocuzza, [name], MD  FLUoxetine (PROZAC) 20 MG capsule Take 60 mg by mouth daily. 06/14/18   [provider]  folic acid (FOLVITE) 1 MG tablet TAKE 1 TABLET BY MOUTH EVERY DAY 04/13/19   Guse, Jacquelynn Cree, FNP  memantine (NAMENDA) 5 MG tablet Take 1 tablet (5 mg total) by mouth 2 (two) times daily. Taking qd 08/15/19   McLean-Scocuzza, [name], MD  metoprolol succinate (TOPROL-XL) 25 MG 24 hr tablet Take 1 tablet (25 mg total) by mouth daily. 08/15/19   McLean-Scocuzza, [name], MD  Multiple Vitamins-Minerals (MULTIVITAMIN WITH MINERALS) tablet Take 1 tablet by mouth daily.    [provider]  MYRBETRIQ 50 MG TB24 tablet TAKE 1  TABLET BY MOUTH EVERY DAY 08/18/19   Hollice Espy, MD  omega-3 fish oil (MAXEPA) 1000 MG CAPS capsule Take 1 capsule by mouth daily.  05/08/18   [provider]  pantoprazole (PROTONIX) 40 MG tablet Take 1 tablet (40 mg total) by mouth daily. 30 min before breakfast 08/15/19   McLean-Scocuzza, [name], MD  rosuvastatin (CRESTOR) 10 MG tablet Take 1 tablet (10 mg total) by mouth at bedtime. 08/15/19   McLean-Scocuzza, [name], MD  tamsulosin (FLOMAX) 0.4 MG CAPS capsule Take 1 capsule (0.4 mg total) by mouth daily after supper. 08/15/19   McLean-Scocuzza, [name], MD  traZODone (DESYREL) 150 MG tablet Take 1 tablet (150 mg total) by mouth at bedtime. 08/15/19   McLean-Scocuzza, [name], MD  varenicline (CHANTIX) 0.5 MG tablet Take 1 tablet (0.5 mg total) by  mouth 2 (two) times daily. 09/06/19   McLean-Scocuzza, [name], MD    Family History  Problem Relation Age of Onset  . Heart disease Father        Unclear details. Father passed in late 85's 2/2 cancer.  . Colon cancer Father   . Alcohol abuse Sister   . Drug abuse Sister   . Anxiety disorder Sister   . Depression Sister   . Kidney disease Neg Hx   . Prostate cancer Neg Hx      Social History   Tobacco Use  . Smoking status: Former Smoker    Types: Cigarettes    Start date: 07/28/1962    Quit date: 09/01/2019    Years since quitting: 0.0  . Smokeless tobacco: Never Used  Substance Use Topics  . Alcohol use: Yes    Alcohol/week: 6.0 standard drinks    Types: 6 Cans of beer per week  . Drug use: Yes    Frequency: 7.0 times per week    Types: Marijuana    Comment: Endorsed heroin 06/2014, UDS also + benzos, opiates, THC, neg for cocaine at that time.    Allergies as of 09/13/2019  . (No Known Allergies)    Review of Systems:    All systems reviewed and negative except where noted in HPI.   Physical Exam:  There were no vitals taken for this visit. No LMP for male patient. General:   Alert,  Well-developed,  well-nourished, pleasant and cooperative in NAD Head:  Normocephalic and atraumatic. Eyes:  Sclera clear, no icterus.   Conjunctiva pink. Ears:  Normal auditory acuity. Neck:  Supple; no masses or thyromegaly. Lungs: Decreased breath sounds bilaterally without any crackles or rhonchi.. Heart:  Regular rate and rhythm; no murmurs, clicks, rubs, or gallops. Abdomen:  Normal bowel sounds.  No bruits.  Soft, non-tender and non-distended without masses, hepatosplenomegaly or hernias noted.  No guarding or rebound tenderness.  Negative Carnett sign.   Rectal:  Deferred.  Pulses:  Normal pulses noted. Extremities:  No clubbing or edema.  No cyanosis. Neurologic:  Alert and oriented x3;  grossly normal neurologically. Skin:  Intact without significant lesions or rashes.  No jaundice. Lymph Nodes:  No significant cervical adenopathy. Psych:  Alert and cooperative. Normal mood and affect.  Imaging Studies: CT Chest Wo Contrast  Result Date: 08/30/2019 CLINICAL DATA:  Follow-up of right middle lobe pulmonary nodule on abdominal CT. 55 pack-year smoking history. Hepatitis C and prostate cancer. COPD. EXAM: CT CHEST WITHOUT CONTRAST TECHNIQUE: Multidetector CT imaging of the chest was performed following the standard protocol without IV contrast. COMPARISON:  05/25/2018 abdominopelvic CT. FINDINGS: Cardiovascular: Aortic atherosclerosis. Tortuous thoracic aorta. Normal heart size, without pericardial effusion. Multivessel coronary artery atherosclerosis. Mediastinum/Nodes: No mediastinal or definite hilar adenopathy, given limitations of unenhanced CT. Lungs/Pleura: No pleural fluid. Moderate centrilobular and paraseptal emphysema. The right middle lobe pulmonary nodule is no longer identified. Right base opacities are progressive since 05/25/2018, likely atelectasis or developing scar Since 04/22/2005, development of a pleural-based left lower lobe 1.0 cm nodule on 104/3. Upper Abdomen: Normal imaged portions  of the liver, spleen, stomach, pancreas, gallbladder, adrenal glands. Bilateral fluid density renal lesions are likely cysts. Abdominal aortic atherosclerosis. Musculoskeletal: Nonacute bilateral rib fractures, including seventh and eighth incompletely healed lateral right rib fractures. IMPRESSION: 1. The right middle lobe pulmonary nodule is no longer visualized and has likely resolved. 2. Since 04/22/2005, development of a 1.0 cm left lower lobe pulmonary nodule, for which primary  bronchogenic carcinoma cannot be excluded. Consider multidisciplinary thoracic oncology consultation for eventual PET. 3. No thoracic adenopathy. 4. Aortic atherosclerosis (ICD10-I70.0), coronary artery atherosclerosis and emphysema (ICD10-J43.9). These results will be called to the ordering clinician or representative by the Radiologist Assistant, and communication documented in the PACS or zVision Dashboard. Electronically Signed   By: Abigail Miyamoto M.D.   On: 08/30/2019 13:09   MR ABDOMEN W WO CONTRAST  Result Date: 08/30/2019 CLINICAL DATA:  Follow-up renal lesions on CT EXAM: MRI ABDOMEN WITHOUT AND WITH CONTRAST TECHNIQUE: Multiplanar multisequence MR imaging of the abdomen was performed both before and after the administration of intravenous contrast. CONTRAST:  37mL GADAVIST GADOBUTROL 1 MMOL/ML IV SOLN COMPARISON:  CT abdomen/pelvis dated 05/25/2018 FINDINGS: Lower chest: Lung bases are clear. Hepatobiliary: Scattered hepatic cysts measuring up to 7 mm in the left hepatic lobe (series 4/image 6). No suspicious/enhancing hepatic lesions. Gallbladder is unremarkable. No intrahepatic or extrahepatic ductal dilatation. Pancreas:  Within normal limits. Spleen: 13 mm enhancing lesion in the central spleen (series 12/image 4), favoring a benign hemangioma. Adrenals/Urinary Tract:  Adrenal glands are within normal limits. Multiple bilateral renal cysts, many of which are simple, measuring up to 3.2 cm in the medial right lower kidney  (series 4/image 23), benign (Bosniak I). Dominant 4.1 cm cyst in the anterior right lower kidney (series 4/image 26) is notable for layering hemorrhage, benign (Bosniak II). Additional 10 mm lesion in the lateral left lower kidney (series 4/image 24), with intrinsic T1 hyperintensity but no enhancement following contrast administration, compatible with a benign hemorrhagic cyst (Bosniak II). A 15 mm complex cystic lesion in the anterior right lower kidney (series 4/image 19) is notable for a single thin septation and lateral mural nodule with intrinsic T1 hyperintensity/hemorrhage (series 11/image 45). This previously measured 2.3 cm on CT and therefore favors retracted clot within a complex cyst. No definite enhancement, although this is difficult to entirely exclude given motion. Although this is favored to be benign, this is considered a Bosniak IIF lesion, warranting at least one additional follow-up. No hydronephrosis. Stomach/Bowel: Stomach is within normal limits. Visualized bowel is notable for scattered colonic diverticulosis, without evidence of diverticulitis. Normal appendix (series 4/image 32). Vascular/Lymphatic:  No evidence of abdominal aortic aneurysm. No suspicious abdominal lymphadenopathy. Other:  No abdominal ascites. Musculoskeletal: Status post PLIF at L4-5. Degenerative changes of the lumbar spine. IMPRESSION: 15 mm complex cystic lesion with hemorrhagic mural nodule in the anterior right lower kidney, previously 2.3 cm. While favored to be benign, this is considered a Bosniak IIF lesion, warranting at least one additional follow-up. Given low risk, follow-up MRI abdomen with/without contrast in 1 year would likely suffice. Additional benign simple and hemorrhagic cysts bilaterally, as described above (Bosniak I-II). Electronically Signed   By: Julian Hy M.D.   On: 08/30/2019 10:54   NM PET Image Initial (PI) Skull Base To Thigh  Result Date: 09/07/2019 CLINICAL DATA:  Initial  treatment strategy for left lower lobe pulmonary nodule on chest CT. History of prostate cancer. Risk factors for lung cancer. EXAM: NUCLEAR MEDICINE PET SKULL BASE TO THIGH TECHNIQUE: 13.96 mCi F-18 FDG was injected intravenously. Full-ring PET imaging was performed from the skull base to thigh after the radiotracer. CT data was obtained and used for attenuation correction and anatomic localization. Fasting blood glucose: 106 mg/dl COMPARISON:  Chest CT 08/30/2019, abdominal CT 05/25/2018 and abdominal MRI 08/30/2019. FINDINGS: Mediastinal blood pool activity: SUV max 2.5 NECK: No hypermetabolic cervical lymph nodes are identified.There are no  lesions of the pharyngeal mucosal space. There is a hypermetabolic lesion within the superficial portion of the left parotid gland, measuring 1.7 x 2.0 cm on image 38/3. This has an SUV max of 6.2. Incidental CT findings: Bilateral carotid atherosclerosis. CHEST: The subpleural nodule of concern posteriorly in the left lower lobe is mildly hypermetabolic (SUV max 2.7). This nodule measures 1.2 x 1.2 cm on image 117/3. No other hypermetabolic pulmonary activity or suspicious nodularity. There is low-level hilar activity bilaterally without discrete adenopathy. This has an SUV max of 4.6 on the left and 3.3 on the right. No hypermetabolic mediastinal or axillary adenopathy. Incidental CT findings: Three-vessel coronary artery atherosclerosis with lesser involvement of the aorta and great vessels. Mild to moderate centrilobular and paraseptal emphysema with further improvement in the right basilar aeration. ABDOMEN/PELVIS: There is no hypermetabolic activity within the liver, adrenal glands, spleen or pancreas. There is no hypermetabolic nodal activity. There is hypermetabolic activity associated with the anise (SUV max 8.7). No associated focal mass lesion or bowel obstruction. No abnormal metabolic activity in the prostate gland post brachytherapy. Incidental CT findings:  Grossly stable renal cysts without hypermetabolic activity. Aortic and branch vessel atherosclerosis. Postsurgical changes in the left inguinal region. SKELETON: There is no hypermetabolic activity to suggest osseous metastatic disease. Incidental CT findings: Previous L4-5 fusion with adjacent segment disease at L3-4. IMPRESSION: 1. The left lower lobe pulmonary nodule is mildly hypermetabolic, suspicious for bronchogenic carcinoma or a solitary metastasis. No other suspicious pulmonary nodules. 2. No definite mediastinal adenopathy. Nonspecific low-level hilar activity bilaterally, possibly reactive. 3. Hypermetabolic left parotid mass, likely an incidental benign neoplasm such as Warthin's tumor. ENT evaluation recommended. 4. Indeterminate hypermetabolic activity at the anus, possibly physiologic. This appears unrelated to the prostate gland which appears normal post brachytherapy. Correlation with digital rectal exam recommended. 5. Coronary and Aortic Atherosclerosis (ICD10-I70.0). Emphysema (ICD10-J43.9). Electronically Signed   By: Richardean Sale M.D.   On: 09/07/2019 11:23   Pulmonary Function Test [facility] Only  Result Date: 09/09/2019 Spirometry Data Is Acceptable and Reproducible Moderate Obstructive Airways Disease withOUT Significant Broncho-Dilator Response Consider outpatient Pulmonary Consultation if needed Clinical Correlation Advised    Assessment and Plan:   Christopher Orr is a 74 y.o. y/o male who comes in today with a abnormal PET scan and a father with colon cancer who died at 74.  The patient has not had a colonoscopy in many years.  The patient also has hepatitis C induced cirrhosis with sustained viral response after treatment in 2016.  The patient has not had an EGD in many years and will need an EGD to rule out varices.  The patient has been explained the EGD and colonoscopy and agrees with the plan.  The patient will be set up at the next available appointment.    Lucilla Lame,  MD. Marval Regal    Note: This dictation was prepared with Dragon dictation along with smaller phrase technology. Any transcriptional errors that result from this process are unintentional.

## 2019-09-13 NOTE — Progress Notes (Signed)
The patient is scheduled to see Dr Allen Norris at [place] on 09/13/19 at 10:00 am  The patient is scheduled to see Dr Beverly Gust at [facility] on 09/20/19 at 1:45 pm.

## 2019-09-14 ENCOUNTER — Encounter: Payer: Self-pay | Admitting: Internal Medicine

## 2019-09-14 ENCOUNTER — Other Ambulatory Visit (INDEPENDENT_AMBULATORY_CARE_PROVIDER_SITE_OTHER): Payer: Medicare HMO

## 2019-09-14 DIAGNOSIS — Z1159 Encounter for screening for other viral diseases: Secondary | ICD-10-CM | POA: Diagnosis not present

## 2019-09-14 DIAGNOSIS — Z13220 Encounter for screening for lipoid disorders: Secondary | ICD-10-CM | POA: Diagnosis not present

## 2019-09-14 DIAGNOSIS — E559 Vitamin D deficiency, unspecified: Secondary | ICD-10-CM

## 2019-09-14 DIAGNOSIS — R7303 Prediabetes: Secondary | ICD-10-CM | POA: Insufficient documentation

## 2019-09-14 DIAGNOSIS — Z13818 Encounter for screening for other digestive system disorders: Secondary | ICD-10-CM

## 2019-09-14 DIAGNOSIS — Z1389 Encounter for screening for other disorder: Secondary | ICD-10-CM

## 2019-09-14 DIAGNOSIS — K7460 Unspecified cirrhosis of liver: Secondary | ICD-10-CM

## 2019-09-14 DIAGNOSIS — R739 Hyperglycemia, unspecified: Secondary | ICD-10-CM | POA: Diagnosis not present

## 2019-09-14 DIAGNOSIS — Z1329 Encounter for screening for other suspected endocrine disorder: Secondary | ICD-10-CM

## 2019-09-14 DIAGNOSIS — Z8546 Personal history of malignant neoplasm of prostate: Secondary | ICD-10-CM

## 2019-09-14 LAB — LIPID PANEL
Cholesterol: 131 mg/dL (ref 0–200)
HDL: 48.9 mg/dL (ref >39.00)
LDL Cholesterol: 55 mg/dL (ref 0–99)
NonHDL: 82.05
Total CHOL/HDL Ratio: 3
Triglycerides: 133 mg/dL (ref 0.0–149.0)
VLDL: 26.6 mg/dL (ref 0.0–40.0)

## 2019-09-14 LAB — CBC WITH DIFFERENTIAL/PLATELET
Basophils Absolute: 0.1 10*3/uL (ref 0.0–0.1)
Basophils Relative: 1 % (ref 0.0–3.0)
Eosinophils Absolute: 0.3 10*3/uL (ref 0.0–0.7)
Eosinophils Relative: 4.2 % (ref 0.0–5.0)
HCT: 39.3 % (ref 39.0–52.0)
Hemoglobin: 13.2 g/dL (ref 13.0–17.0)
Lymphocytes Relative: 23.3 % (ref 12.0–46.0)
Lymphs Abs: 1.7 10*3/uL (ref 0.7–4.0)
MCHC: 33.6 g/dL (ref 30.0–36.0)
MCV: 101 fl — ABNORMAL HIGH (ref 78.0–100.0)
Monocytes Absolute: 0.6 10*3/uL (ref 0.1–1.0)
Monocytes Relative: 9 % (ref 3.0–12.0)
Neutro Abs: 4.5 10*3/uL (ref 1.4–7.7)
Neutrophils Relative %: 62.5 % (ref 43.0–77.0)
Platelets: 228 10*3/uL (ref 150.0–400.0)
RBC: 3.89 Mil/uL — ABNORMAL LOW (ref 4.22–5.81)
RDW: 15.1 % (ref 11.5–15.5)
WBC: 7.2 10*3/uL (ref 4.0–10.5)

## 2019-09-14 LAB — HEMOGLOBIN A1C: Hgb A1c MFr Bld: 6.2 % (ref 4.6–6.5)

## 2019-09-14 LAB — COMPREHENSIVE METABOLIC PANEL
ALT: 18 U/L (ref 0–53)
AST: 19 U/L (ref 0–37)
Albumin: 4 g/dL (ref 3.5–5.2)
Alkaline Phosphatase: 92 U/L (ref 39–117)
BUN: 17 mg/dL (ref 6–23)
CO2: 31 mEq/L (ref 19–32)
Calcium: 8.9 mg/dL (ref 8.4–10.5)
Chloride: 104 mEq/L (ref 96–112)
Creatinine, Ser: 1.19 mg/dL (ref 0.40–1.50)
GFR: 59.91 mL/min — ABNORMAL LOW (ref >60.00)
Glucose, Bld: 96 mg/dL (ref 70–99)
Potassium: 4.7 mEq/L (ref 3.5–5.1)
Sodium: 140 mEq/L (ref 135–145)
Total Bilirubin: 0.5 mg/dL (ref 0.2–1.2)
Total Protein: 6.7 g/dL (ref 6.0–8.3)

## 2019-09-14 LAB — VITAMIN D 25 HYDROXY (VIT D DEFICIENCY, FRACTURES): VITD: 33.67 ng/mL (ref 30.00–100.00)

## 2019-09-14 LAB — TSH: TSH: 0.77 u[IU]/mL (ref 0.35–4.50)

## 2019-09-15 LAB — URINALYSIS, ROUTINE W REFLEX MICROSCOPIC
Bilirubin Urine: NEGATIVE
Glucose, UA: NEGATIVE
Hgb urine dipstick: NEGATIVE
Ketones, ur: NEGATIVE
Leukocytes,Ua: NEGATIVE
Nitrite: NEGATIVE
Protein, ur: NEGATIVE
Specific Gravity, Urine: 1.019 (ref 1.001–1.03)
pH: 6.5 (ref 5.0–8.0)

## 2019-09-19 ENCOUNTER — Ambulatory Visit: Payer: Medicare HMO | Admitting: Gastroenterology

## 2019-09-19 ENCOUNTER — Telehealth: Payer: Self-pay | Admitting: Gastroenterology

## 2019-09-20 DIAGNOSIS — D37030 Neoplasm of uncertain behavior of the parotid salivary glands: Secondary | ICD-10-CM | POA: Diagnosis not present

## 2019-09-20 NOTE — Telephone Encounter (Signed)
Pt notified per Dr. Dina Rich, ok'd to stop Plavix 75mg  5 days prior to colonoscopy and restart 2 days after. See clearance in Media.

## 2019-09-21 LAB — HCV RNA,QUANTITATIVE REAL TIME PCR
HCV Quantitative Log: <1.18 Log IU/mL
HCV RNA, PCR, QN: <15 IU/mL

## 2019-09-21 LAB — HEPATITIS C ANTIBODY
Hepatitis C Ab: REACTIVE — AB
SIGNAL TO CUT-OFF: 33.1 — ABNORMAL HIGH (ref <1.00)

## 2019-09-22 ENCOUNTER — Telehealth: Payer: Self-pay | Admitting: Cardiovascular Disease

## 2019-09-22 NOTE — Telephone Encounter (Signed)
   [facility] Pre-operative Risk Assessment    Request for surgical clearance:  1. What type of surgery is being performed? US Guided Core Biopsy  2. When is this surgery scheduled? TBD - ~ in 6 weeks  3. What type of clearance is required (medical clearance vs. Pharmacy clearance to hold med vs. Both)? both  4. Are there any medications that need to be held prior to surgery and how long? Clearance to stop Plavix 5 days prior   5. Practice name and name of physician performing surgery? [facility], Dr Beverly Gust  6. What is your office phone number [phone]   7.   What is your office fax number [phone]  8.   Anesthesia type (None, local, MAC, general) ? Not listed    Christopher Orr 09/22/2019, 4:40 PM  _________________________________________________________________   (provider comments below)

## 2019-09-23 ENCOUNTER — Ambulatory Visit (INDEPENDENT_AMBULATORY_CARE_PROVIDER_SITE_OTHER): Payer: Medicare HMO | Admitting: Family

## 2019-09-23 ENCOUNTER — Encounter: Payer: Self-pay | Admitting: Family

## 2019-09-23 ENCOUNTER — Other Ambulatory Visit: Payer: Self-pay

## 2019-09-23 VITALS — BP 106/68 | Ht 72.0 in | Wt 266.0 lb

## 2019-09-23 DIAGNOSIS — E785 Hyperlipidemia, unspecified: Secondary | ICD-10-CM

## 2019-09-23 DIAGNOSIS — Z720 Tobacco use: Secondary | ICD-10-CM

## 2019-09-23 DIAGNOSIS — Z01810 Encounter for preprocedural cardiovascular examination: Secondary | ICD-10-CM

## 2019-09-23 DIAGNOSIS — I2510 Atherosclerotic heart disease of native coronary artery without angina pectoris: Secondary | ICD-10-CM

## 2019-09-23 NOTE — Telephone Encounter (Signed)
Seen during office visit 09/23/2019. Confirmed with patient that he was cleared for procedure from cardiac standpoint and could hold his Plavix 5 days prior to procedure. Office note with this information was routed to his surgeon via the [facility] fax function. Will remove request from preop pool.   Loel Dubonnet, NP

## 2019-09-23 NOTE — Patient Instructions (Addendum)
Medication Instructions:  No medication changes today.   *If you need a refill on your cardiac medications before your next appointment, please call your pharmacy*   Lab Work: No lab work today.  If you have labs (blood work) drawn today and your tests are completely normal, you will receive your results only by: Marland Kitchen MyChart Message (if you have MyChart) OR . A paper copy in the mail If you have any lab test that is abnormal or we need to change your treatment, we will call you to review the results.   Testing/Procedures: Your EKG today showed normal sinus rhythm which is a good result!   Follow-Up: At [facility], you and your health needs are our priority.  As part of our continuing mission to provide you with exceptional heart care, we have created designated Provider Care Teams.  These Care Teams include your primary Cardiologist (physician) and Advanced Practice Providers (APPs -  Physician Assistants and Nurse Practitioners) who all work together to provide you with the care you need, when you need it.  We recommend signing up for the patient portal called "MyChart".  Sign up information is provided on this After Visit Summary.  MyChart is used to connect with patients for Virtual Visits (Telemedicine).  Patients are able to view lab/test results, encounter notes, upcoming appointments, etc.  Non-urgent messages can be sent to your provider as well.   To learn more about what you can do with MyChart, go to [email or web address].    Your next appointment:  In 6 months in person with Dr. Fletcher Anon  Other Instructions:   We will send a note to [facility] that you're good to go for the parotid biopsy. You may hold your Plavix 5 days before the biopsy.   Recommend using 1-800-QUITNOW   Coping with Quitting Smoking  Quitting smoking is a physical and mental challenge. You will face cravings, withdrawal symptoms, and temptation. Before quitting, work with your health care  provider to make a plan that can help you cope. Preparation can help you quit and keep you from giving in. How can I cope with cravings? Cravings usually last for 5-10 minutes. If you get through it, the craving will pass. Consider taking the following actions to help you cope with cravings:  Keep your mouth busy: ? Chew sugar-free gum. ? Suck on hard candies or a straw. ? Brush your teeth.  Keep your hands and body busy: ? Immediately change to a different activity when you feel a craving. ? Squeeze or play with a ball. ? Do an activity or a hobby, like making bead jewelry, practicing needlepoint, or working with wood. ? Mix up your normal routine. ? Take a short exercise break. Go for a quick walk or run up and down stairs. ? Spend time in public places where smoking is not allowed.  Focus on doing something kind or helpful for someone else.  Call a friend or family member to talk during a craving.  Join a support group.  Call a quit line, such as 1-800-QUIT-NOW.  Talk with your health care provider about medicines that might help you cope with cravings and make quitting easier for you. How can I deal with withdrawal symptoms? Your body may experience negative effects as it tries to get used to not having nicotine in the system. These effects are called withdrawal symptoms. They may include:  Feeling hungrier than normal.  Trouble concentrating.  Irritability.  Trouble sleeping.  Feeling depressed.  Restlessness  and agitation.  Craving a cigarette. To manage withdrawal symptoms:  Avoid places, people, and activities that trigger your cravings.  Remember why you want to quit.  Get plenty of sleep.  Avoid coffee and other caffeinated drinks. These may worsen some of your symptoms. How can I handle social situations? Social situations can be difficult when you are quitting smoking, especially in the first few weeks. To manage this, you can:  Avoid parties, bars,  and other social situations where people might be smoking.  Avoid alcohol.  Leave right away if you have the urge to smoke.  Explain to your family and friends that you are quitting smoking. Ask for understanding and support.  Plan activities with friends or family where smoking is not an option. What are some ways I can cope with stress? Wanting to smoke may cause stress, and stress can make you want to smoke. Find ways to manage your stress. Relaxation techniques can help. For example:  Breathe slowly and deeply, in through your nose and out through your mouth.  Listen to soothing, relaxing music.  Talk with a family member or friend about your stress.  Light a candle.  Soak in a bath or take a shower.  Think about a peaceful place. What are some ways I can prevent weight gain? Be aware that many people gain weight after they quit smoking. However, not everyone does. To keep from gaining weight, have a plan in place before you quit and stick to the plan after you quit. Your plan should include:  Having healthy snacks. When you have a craving, it may help to: ? Eat plain popcorn, crunchy carrots, celery, or other cut vegetables. ? Chew sugar-free gum.  Changing how you eat: ? Eat small portion sizes at meals. ? Eat 4-6 small meals throughout the day instead of 1-2 large meals a day. ? Be mindful when you eat. Do not watch television or do other things that might distract you as you eat.  Exercising regularly: ? Make time to exercise each day. If you do not have time for a long workout, do short bouts of exercise for 5-10 minutes several times a day. ? Do some form of strengthening exercise, like weight lifting, and some form of aerobic exercise, like running or swimming.  Drinking plenty of water or other low-calorie or no-calorie drinks. Drink 6-8 glasses of water daily, or as much as instructed by your health care provider. Summary  Quitting smoking is a physical and  mental challenge. You will face cravings, withdrawal symptoms, and temptation to smoke again. Preparation can help you as you go through these challenges.  You can cope with cravings by keeping your mouth busy (such as by chewing gum), keeping your body and hands busy, and making calls to family, friends, or a helpline for people who want to quit smoking.  You can cope with withdrawal symptoms by avoiding places where people smoke, avoiding drinks with caffeine, and getting plenty of rest.  Ask your health care provider about the different ways to prevent weight gain, avoid stress, and handle social situations. This information is not intended to replace advice given to you by your health care provider. Make sure you discuss any questions you have with your health care provider. Document Revised: 06/26/2017 Document Reviewed: 07/11/2016 Elsevier Patient Education  2020 [employer].

## 2019-09-23 NOTE — Progress Notes (Signed)
Office Visit    Patient Name: Christopher Orr Date of Encounter: 09/23/2019  Primary Care Provider:  McLean-Scocuzza, [name], MD Primary Cardiologist:  Kathlyn Sacramento, MD Electrophysiologist:  None   Chief Complaint    Christopher Orr is a 74 y.o. male with a hx of CAD s/p LAD stenting 2006 with Cypher DES, tobacco [place], COPD, HLD, obesity, previous excessive etoh use and hepatitis C which was treated. He presents today for cardiology clearance of upcoming US guided core biopsy.   Past Medical History    Past Medical History:  Diagnosis Date  . Alcohol abuse    pt states it is marijuana  . Anemia   . Arthritis   . Bipolar disorder ([place])   . BPH (benign prostatic hypertrophy) with urinary obstruction   . CAD (coronary artery disease)    a. s/p stent to LAD in 2006 at [facility], EF 60% at that time.  . Cancer [facility])    prostate  . CHF (congestive heart failure) ([place])   . Chronic pancreatitis ([place])   . COPD (chronic obstructive pulmonary disease) ([place])   . Depression   . Diverticulosis   . Drug use   . Dyspnea   . ED (erectile dysfunction)   . Gastritis   . GERD (gastroesophageal reflux disease)   . Headache    occasional migraines  . Hepatitis C    treated  . History of lumbar fusion   . Hyperlipidemia   . Hypertension    patient denies having high blood pressure  . Mitral regurgitation   . Pancreatitis    x 2   . Pneumonia    07/06/18   . PTSD (post-traumatic stress disorder)   . Rib fracture    s/p fall 03/15/19   . Sinus disease   . Urge incontinence    Past Surgical History:  Procedure Laterality Date  . cateract  2013  . CORONARY STENT PLACEMENT  2006   x 1  . ESOPHAGOGASTRODUODENOSCOPY N/A 12/22/2014   Procedure: ESOPHAGOGASTRODUODENOSCOPY (EGD);  Surgeon: [name], MD;  Location: [facility] ENDOSCOPY;  Service: Endoscopy;  Laterality: N/A;  . HERNIA REPAIR  2015   left groin  . LUMBAR FUSION    . RADIOACTIVE SEED IMPLANT N/A 04/22/2016   Procedure: RADIOACTIVE SEED IMPLANT/BRACHYTHERAPY IMPLANT;  Surgeon: Hollice Espy, MD;  Location: [facility] ORS;  Service: Urology;  Laterality: N/A;  . ROBOT ASSISTED INGUINAL HERNIA REPAIR Bilateral 07/06/2018   Procedure: ROBOT ASSISTED INGUINAL HERNIA REPAIR;  Surgeon: Jules Husbands, MD;  Location: [facility] ORS;  Service: General;  Laterality: Bilateral;  . TONSILLECTOMY    . UMBILICAL HERNIA REPAIR N/A 07/06/2018   Procedure: LAPAROSCOPIC ROBOT ASSISTED UMBILICAL HERNIA;  Surgeon: Jules Husbands, MD;  Location: [facility] ORS;  Service: General;  Laterality: N/A;    Allergies  No Known Allergies  History of Present Illness    Christopher Orr is a 74 y.o. male with a hx of CAD s/p LAD stenting 2006 with Cypher DES, tobacco [place], COPD, HLD, obesity, previous excessive etoh use and hepatitis C which was treated. He was last seen 03/24/19 by Dr. Fletcher Anon.  No cardiac events since stent in 2006. Cardiac cath 2011 with patent stent and mild restenosis. CT of abdomen 2019 with no evidence of aortic aneurysm.   Newly diagnosed left lower lobe nodule 1cm conerning for bronchogenic carcinoma. Following with oncology and surgery. He had a PET scan which showed uptake up left parotid, left lung nodule, and anorectal  region. He was recommended by Dr. Genevive Bi of general surgery to see ENT and GI prior to lung biopsy.   He presents today for cardiac and medical clearance to proceed with ultrasound guided core biopsy of the parotid.   He reports no chest pain, pressure, tightness.  He reports no shortness of breath at rest.  Tells me he does dyspnea on exertion is stable at baseline he attributes this to his COPD.  He tells me he has had no symptoms similar to his previous MI.  No formal exercise routine.  We discussed his exercise tolerance and he is able to walk a flight of stairs without stopping.  Tells me he is gone 2 weeks without smoking and congratulated him on his cessation.  He is very motivated to  quit.  EKGs/Labs/Other Studies Reviewed:   The following studies were reviewed today:  EKG:  EKG is ordered today.  The ekg ordered today demonstrates SR 90 bpm with left axis deviation and nonspecific ST/T wave changes.   Recent Labs: 09/14/2019: ALT 18; BUN 17; Creatinine, Ser 1.19; Hemoglobin 13.2; Platelets 228.0; Potassium 4.7; Sodium 140; TSH 0.77  Recent Lipid Panel    Component Value Date/Time   CHOL 131 09/14/2019 0947   TRIG 133.0 09/14/2019 0947   HDL 48.90 09/14/2019 0947   CHOLHDL 3 09/14/2019 0947   VLDL 26.6 09/14/2019 0947   LDLCALC 55 09/14/2019 0947    Home Medications   Current Meds  Medication Sig  . albuterol (PROVENTIL) (2.5 MG/3ML) 0.083% nebulizer solution Take 3 mLs (2.5 mg total) by nebulization every 4 (four) hours as needed for wheezing or shortness of breath.  . alprazolam (XANAX) 1 MG tablet Take 1 tablet (1 mg total) by mouth as directed. 1/2 in am 1 pill qhs  . ARIPiprazole (ABILIFY) 10 MG tablet Take 10 mg by mouth daily.  Marland Kitchen aspirin 81 MG tablet Take 81 mg by mouth daily. Reported on 01/15/2016  . budesonide-formoterol (SYMBICORT) 160-4.5 MCG/ACT inhaler Inhale 2 puffs into the lungs 2 (two) times daily. Rinse mouth out  . celecoxib (CELEBREX) 200 MG capsule Take 1 capsule (200 mg total) by mouth daily after breakfast.  . clopidogrel (PLAVIX) 75 MG tablet Take 1 tablet (75 mg total) by mouth daily. Reported on 01/15/2016  . cyanocobalamin 1000 MCG tablet Take 1,000 mcg by mouth daily.  Marland Kitchen donepezil (ARICEPT) 5 MG tablet Take 1 tablet (5 mg total) by mouth at bedtime.  Marland Kitchen FLUoxetine (PROZAC) 20 MG capsule Take 60 mg by mouth daily.  . folic acid (FOLVITE) 1 MG tablet TAKE 1 TABLET BY MOUTH EVERY DAY  . memantine (NAMENDA) 5 MG tablet Take 1 tablet (5 mg total) by mouth 2 (two) times daily. Taking qd  . metoprolol succinate (TOPROL-XL) 25 MG 24 hr tablet Take 1 tablet (25 mg total) by mouth daily.  . Multiple Vitamins-Minerals (MULTIVITAMIN WITH  MINERALS) tablet Take 1 tablet by mouth daily.  Marland Kitchen MYRBETRIQ 50 MG TB24 tablet TAKE 1 TABLET BY MOUTH EVERY DAY  . omega-3 fish oil (MAXEPA) 1000 MG CAPS capsule Take 1 capsule by mouth daily.   . pantoprazole (PROTONIX) 40 MG tablet Take 1 tablet (40 mg total) by mouth daily. 30 min before breakfast  . rosuvastatin (CRESTOR) 10 MG tablet Take 1 tablet (10 mg total) by mouth at bedtime.  . tamsulosin (FLOMAX) 0.4 MG CAPS capsule Take 1 capsule (0.4 mg total) by mouth daily after supper.  . traZODone (DESYREL) 150 MG tablet Take 1 tablet (150  mg total) by mouth at bedtime.    Review of Systems    Review of Systems  Constitution: Negative for chills, fever and malaise/fatigue.  Cardiovascular: Positive for dyspnea on exertion. Negative for chest pain, irregular heartbeat, leg swelling, near-syncope, orthopnea, palpitations and syncope.  Respiratory: Negative for cough, shortness of breath and wheezing.   Gastrointestinal: Negative for melena, nausea and vomiting.  Genitourinary: Negative for hematuria.  Neurological: Negative for dizziness, light-headedness and weakness.   All other systems reviewed and are otherwise negative except as noted above.  Physical Exam    VS:  BP 106/68 (BP Location: Left Arm, Patient Position: Sitting, Cuff Size: Large)   Ht 6' (1.829 m)   Wt 266 lb (120.7 kg)   SpO2 97%   BMI 36.08 kg/m  , BMI Body mass index is 36.08 kg/m. GEN: Well nourished, overweight, well developed, in no acute distress. HEENT: normal. Neck: Supple, no JVD, carotid bruits, or masses. Cardiac: RRR, no murmurs, rubs, or gallops. No clubbing, cyanosis, edema.  Radials/DP/PT 2+ and equal bilaterally.  Respiratory:  Respirations regular and unlabored, clear to auscultation bilaterally.  Diminished bases bilaterally. GI: Soft, nontender, nondistended, BS + x 4. MS: No deformity or atrophy. Skin: Warm and dry, no rash. Neuro:  Strength and sensation are intact. Psych: Normal  affect.  Accessory Clinical Findings    ECG personally reviewed by me today -sinus rhythm 90 bpm with left axis deviation and nonspecific ST/T wave changes-stable when compared to previous EKG.- no acute changes.  Assessment & Plan    1. Cardiovascular clearance - Up coming core biopsy of parotid gland secondary to positive PET scan.  He has a history of CAD, as below. According to the Revised Cardiac Risk Index (RCRI), his Perioperative Risk of Major Cardiac Event is (%): 0.9. His Functional Capacity in METs is: 4.64 according to the Duke Activity Status Index (DASI).  His CAD is without anginal symptoms.  EKG today shows sinus rhythm 90 bpm with no acute ST/T wave changes.  No indication for ischemic evaluation at this time.  He is on guideline directed medical therapy.  He may proceed with upcoming ultrasound-guided core biopsy without further cardiac testing.  He is on DAPT as he has a first generation DES.  He may hold his Plavix 5 days prior to procedure as requested as his last cath in 2011 showed patent stent and he has had no anginal symptoms since.  2. CAD - DES to LAD 2006. Cardiac cath 2011 with patent stent and minimal restenosis. Per Dr. Tyrell Antonio recommendation continue DAPT Aspirin/Plavix due to first generation DES.  Stable no anginal symptoms.  No indication for ischemic evaluation this time.  3. HLD, LDL goal <70 - Lipid panel 09/14/19 with LDL 55. Continue Rosuvastatin 10mg  daily and fish oil.  4. Tobacco use -congratulated on 2 weeks of cessation.  Smoking cessation encouraged. Recommend utilization of 1800QUITNOW.  Disposition: Follow up in 6 month(s) with Dr. Fletcher Anon.  A copy of this note was routed to the requesting surgeon's office.   Loel Dubonnet, NP 09/23/2019, 3:47 PM

## 2019-09-23 NOTE — Telephone Encounter (Signed)
Can hold Plavix 5 days before the procedure and resume after.

## 2019-09-23 NOTE — Telephone Encounter (Signed)
   Primary Cardiologist: Kathlyn Sacramento, MD  Chart reviewed as part of pre-operative protocol coverage.  Hx of coronary artery disease status post LAD stenting 2006 with Cypher drug-eluting stent. He had a repeat cardiac catheterization in 2011 which showed patent stent with mild restenosis. He has been maintained on DAPT.  Other medical problems includes prolonged history of ongoing  tobacco use, COPD, hyperlipidemia, obesity, previous excessive alcohol use and hepatitis C which was treated.  Called patient to go over cardiac symptoms however he wants to call us back. Will review surgical clearance during office visit this afternoon. Dr. Fletcher Anon to common on Plavix holding.   [place], [place] 09/23/2019, 9:53 AM

## 2019-09-26 ENCOUNTER — Other Ambulatory Visit: Payer: Self-pay | Admitting: Unknown Physician Specialty

## 2019-09-26 DIAGNOSIS — D37030 Neoplasm of uncertain behavior of the parotid salivary glands: Secondary | ICD-10-CM

## 2019-09-27 ENCOUNTER — Telehealth: Payer: Self-pay | Admitting: Internal Medicine

## 2019-09-27 NOTE — Telephone Encounter (Signed)
Left message to return call 

## 2019-09-27 NOTE — Telephone Encounter (Signed)
Christopher Orr, [place]  09/22/2019 9:13 AM EST    Left message to return call.    McLean-Scocuzza, [name], MD  09/21/2019 9:05 AM EST    Hep C + but viral load negative no need to treat prior infection possible   Cholesterol ok  Liver kidneys ok kidney function slightly increased make sure drinking enough water 50-55 ounces daily  Vitamin D low normal rec multivitamin with vitamin D3 daily  Thyroid lab normal  A1C indicates prediabetes rec healthy diet and exercise  Blood cts normal   Urine normal   Christopher Orr, CMA  09/15/2019 10:40 AM EST    Patient informed of results and verbalized understanding.  Patient will drink more water and start a daily multivitamin with Vitamin D in it.    McLean-Scocuzza, [name], MD  09/15/2019 7:38 AM EST    Urine normal    McLean-Scocuzza, [name], MD  09/14/2019 6:13 PM EST    Hep C + some lab tests pending to see if true of false + will let him know  Cholesterol ok  Liver kidneys ok kidney function slightly increased make sure drinking enough water 50-55 ounces daily  Vitamin D low normal rec multivitamin with vitamin D3 daily  Thyroid lab normal  A1C indicates prediabetes rec healthy diet and exercise  Blood cts normal

## 2019-09-28 ENCOUNTER — Encounter: Payer: Self-pay | Admitting: Internal Medicine

## 2019-09-28 NOTE — Telephone Encounter (Signed)
Patient informed and verbalized understanding

## 2019-09-30 ENCOUNTER — Other Ambulatory Visit
Admission: RE | Admit: 2019-09-30 | Discharge: 2019-09-30 | Disposition: A | Discharge status: Home / Self Care | Payer: Medicare HMO | Source: Ambulatory Visit | Attending: Gastroenterology | Admitting: Gastroenterology

## 2019-09-30 ENCOUNTER — Other Ambulatory Visit: Payer: Self-pay

## 2019-09-30 DIAGNOSIS — Z01812 Encounter for preprocedural laboratory examination: Secondary | ICD-10-CM | POA: Insufficient documentation

## 2019-09-30 DIAGNOSIS — Z20822 Contact with and (suspected) exposure to covid-19: Secondary | ICD-10-CM | POA: Diagnosis not present

## 2019-10-01 LAB — SARS CORONAVIRUS 2 (TAT 6-24 HRS): SARS Coronavirus 2: NEGATIVE

## 2019-10-04 ENCOUNTER — Ambulatory Visit: Payer: Medicare HMO | Admitting: Anesthesiology

## 2019-10-04 ENCOUNTER — Encounter
Admission: RE | Disposition: A | Discharge status: Home / Self Care | Payer: Self-pay | Source: Home / Self Care | Attending: Gastroenterology

## 2019-10-04 ENCOUNTER — Other Ambulatory Visit: Payer: Self-pay

## 2019-10-04 ENCOUNTER — Encounter: Payer: Self-pay | Admitting: Gastroenterology

## 2019-10-04 ENCOUNTER — Ambulatory Visit
Admission: RE | Admit: 2019-10-04 | Discharge: 2019-10-04 | Disposition: A | Discharge status: Home / Self Care | Payer: Medicare HMO | Attending: Gastroenterology | Admitting: Gastroenterology

## 2019-10-04 DIAGNOSIS — B1920 Unspecified viral hepatitis C without hepatic coma: Secondary | ICD-10-CM | POA: Insufficient documentation

## 2019-10-04 DIAGNOSIS — K5730 Diverticulosis of large intestine without perforation or abscess without bleeding: Secondary | ICD-10-CM | POA: Diagnosis not present

## 2019-10-04 DIAGNOSIS — R933 Abnormal findings on diagnostic imaging of other parts of digestive tract: Secondary | ICD-10-CM | POA: Diagnosis not present

## 2019-10-04 DIAGNOSIS — K7460 Unspecified cirrhosis of liver: Secondary | ICD-10-CM | POA: Diagnosis not present

## 2019-10-04 DIAGNOSIS — K635 Polyp of colon: Secondary | ICD-10-CM | POA: Diagnosis not present

## 2019-10-04 DIAGNOSIS — K219 Gastro-esophageal reflux disease without esophagitis: Secondary | ICD-10-CM | POA: Insufficient documentation

## 2019-10-04 DIAGNOSIS — I700 Atherosclerosis of aorta: Secondary | ICD-10-CM | POA: Insufficient documentation

## 2019-10-04 DIAGNOSIS — F319 Bipolar disorder, unspecified: Secondary | ICD-10-CM | POA: Insufficient documentation

## 2019-10-04 DIAGNOSIS — K228 Other specified diseases of esophagus: Secondary | ICD-10-CM | POA: Diagnosis not present

## 2019-10-04 DIAGNOSIS — E785 Hyperlipidemia, unspecified: Secondary | ICD-10-CM | POA: Diagnosis not present

## 2019-10-04 DIAGNOSIS — D126 Benign neoplasm of colon, unspecified: Secondary | ICD-10-CM | POA: Diagnosis not present

## 2019-10-04 DIAGNOSIS — I8501 Esophageal varices with bleeding: Secondary | ICD-10-CM | POA: Diagnosis not present

## 2019-10-04 DIAGNOSIS — K5790 Diverticulosis of intestine, part unspecified, without perforation or abscess without bleeding: Secondary | ICD-10-CM | POA: Diagnosis not present

## 2019-10-04 DIAGNOSIS — I110 Hypertensive heart disease with heart failure: Secondary | ICD-10-CM | POA: Insufficient documentation

## 2019-10-04 DIAGNOSIS — Z955 Presence of coronary angioplasty implant and graft: Secondary | ICD-10-CM | POA: Diagnosis not present

## 2019-10-04 DIAGNOSIS — K648 Other hemorrhoids: Secondary | ICD-10-CM | POA: Diagnosis not present

## 2019-10-04 DIAGNOSIS — Z7951 Long term (current) use of inhaled steroids: Secondary | ICD-10-CM | POA: Diagnosis not present

## 2019-10-04 DIAGNOSIS — F4310 Post-traumatic stress disorder, unspecified: Secondary | ICD-10-CM | POA: Diagnosis not present

## 2019-10-04 DIAGNOSIS — I2510 Atherosclerotic heart disease of native coronary artery without angina pectoris: Secondary | ICD-10-CM | POA: Diagnosis not present

## 2019-10-04 DIAGNOSIS — K861 Other chronic pancreatitis: Secondary | ICD-10-CM | POA: Diagnosis not present

## 2019-10-04 DIAGNOSIS — Z79899 Other long term (current) drug therapy: Secondary | ICD-10-CM | POA: Diagnosis not present

## 2019-10-04 DIAGNOSIS — Z7982 Long term (current) use of aspirin: Secondary | ICD-10-CM | POA: Diagnosis not present

## 2019-10-04 DIAGNOSIS — Z1211 Encounter for screening for malignant neoplasm of colon: Secondary | ICD-10-CM | POA: Diagnosis not present

## 2019-10-04 DIAGNOSIS — Z981 Arthrodesis status: Secondary | ICD-10-CM | POA: Diagnosis not present

## 2019-10-04 DIAGNOSIS — D123 Benign neoplasm of transverse colon: Secondary | ICD-10-CM | POA: Diagnosis not present

## 2019-10-04 DIAGNOSIS — J449 Chronic obstructive pulmonary disease, unspecified: Secondary | ICD-10-CM | POA: Diagnosis not present

## 2019-10-04 DIAGNOSIS — Z800 Family history of malignant neoplasm of digestive organs: Secondary | ICD-10-CM | POA: Diagnosis not present

## 2019-10-04 DIAGNOSIS — N281 Cyst of kidney, acquired: Secondary | ICD-10-CM | POA: Insufficient documentation

## 2019-10-04 DIAGNOSIS — Z7902 Long term (current) use of antithrombotics/antiplatelets: Secondary | ICD-10-CM | POA: Diagnosis not present

## 2019-10-04 DIAGNOSIS — Z791 Long term (current) use of non-steroidal anti-inflammatories (NSAID): Secondary | ICD-10-CM | POA: Insufficient documentation

## 2019-10-04 DIAGNOSIS — Z87891 Personal history of nicotine dependence: Secondary | ICD-10-CM | POA: Insufficient documentation

## 2019-10-04 DIAGNOSIS — I509 Heart failure, unspecified: Secondary | ICD-10-CM | POA: Insufficient documentation

## 2019-10-04 HISTORY — PX: COLONOSCOPY WITH PROPOFOL: SHX5780

## 2019-10-04 HISTORY — PX: ESOPHAGOGASTRODUODENOSCOPY (EGD) WITH PROPOFOL: SHX5813

## 2019-10-04 SURGERY — COLONOSCOPY WITH PROPOFOL
Anesthesia: General

## 2019-10-04 MED ORDER — MIDAZOLAM HCL 2 MG/2ML IJ SOLN
INTRAMUSCULAR | Status: DC | PRN
  Administered 2019-10-04: 2 mg via INTRAVENOUS

## 2019-10-04 MED ORDER — SODIUM CHLORIDE 0.9 % IV SOLN
INTRAVENOUS | Status: DC
  Administered 2019-10-04: 10:00:00 1000 mL via INTRAVENOUS

## 2019-10-04 MED ORDER — LIDOCAINE HCL (CARDIAC) PF 100 MG/5ML IV SOSY
PREFILLED_SYRINGE | INTRAVENOUS | Status: DC | PRN
  Administered 2019-10-04: 50 mg via INTRAVENOUS

## 2019-10-04 MED ORDER — PROPOFOL 500 MG/50ML IV EMUL
INTRAVENOUS | Status: AC
  Filled 2019-10-04: qty 50

## 2019-10-04 MED ORDER — MIDAZOLAM HCL 2 MG/2ML IJ SOLN
INTRAMUSCULAR | Status: AC
  Filled 2019-10-04: qty 2

## 2019-10-04 MED ORDER — FENTANYL CITRATE (PF) 100 MCG/2ML IJ SOLN
INTRAMUSCULAR | Status: DC | PRN
  Administered 2019-10-04: 50 ug via INTRAVENOUS

## 2019-10-04 MED ORDER — FENTANYL CITRATE (PF) 100 MCG/2ML IJ SOLN
INTRAMUSCULAR | Status: AC
  Filled 2019-10-04: qty 2

## 2019-10-04 MED ORDER — PROPOFOL 500 MG/50ML IV EMUL
INTRAVENOUS | Status: DC | PRN
  Administered 2019-10-04: 100 ug/kg/min via INTRAVENOUS

## 2019-10-04 NOTE — Op Note (Signed)
[facility] Patient Name: Christopher Orr Procedure Date: 10/04/2019 9:33 AM MRN: [id number] Account #: [id number] Date of Birth: 1946-06-04 Admit Type: Outpatient Age: 74 Room: [facility] ENDO ROOM 4 Gender: Male Note Status: Finalized Procedure:             Colonoscopy Indications:           Abnormal PET scan of the GI tract Providers:             Lucilla Lame MD, MD Medicines:             Propofol per Anesthesia Complications:         No immediate complications. Procedure:             Pre-Anesthesia Assessment:                        - Prior to the procedure, a History and Physical was                         performed, and patient medications and allergies were                         reviewed. The patient's tolerance of previous                         anesthesia was also reviewed. The risks and benefits                         of the procedure and the sedation options and risks                         were discussed with the patient. All questions were                         answered, and informed consent was obtained. Prior                         Anticoagulants: The patient has taken no previous                         anticoagulant or antiplatelet agents. ASA Grade                         Assessment: III - A patient with severe systemic                         disease. After reviewing the risks and benefits, the                         patient was deemed in satisfactory condition to                         undergo the procedure.                        After obtaining informed consent, the colonoscope was                         passed under direct vision. Throughout the procedure,  the patient's blood pressure, pulse, and oxygen                         saturations were monitored continuously. The                         Colonoscope was introduced through the anus and                         advanced to the the cecum,  identified by appendiceal                         orifice and ileocecal valve. The colonoscopy was                         performed without difficulty. The patient tolerated                         the procedure well. The quality of the bowel                         preparation was excellent. Findings:      The perianal and digital rectal examinations were normal.      A 3 mm polyp was found in the transverse colon. The polyp was sessile.       The polyp was removed with a cold biopsy forceps. Resection and       retrieval were complete.      A 2 mm polyp was found in the sigmoid colon. The polyp was sessile. The       polyp was removed with a cold biopsy forceps. Resection and retrieval       were complete.      Many small-mouthed diverticula were found in the sigmoid colon and       descending colon.      Non-bleeding internal hemorrhoids were found during retroflexion. The       hemorrhoids were Grade II (internal hemorrhoids that prolapse but reduce       spontaneously). Impression:            - One 3 mm polyp in the transverse colon, removed with                         a cold biopsy forceps. Resected and retrieved.                        - One 2 mm polyp in the sigmoid colon, removed with a                         cold biopsy forceps. Resected and retrieved.                        - Diverticulosis in the sigmoid colon and in the                         descending colon.                        - Non-bleeding internal hemorrhoids. Recommendation:        - Discharge patient to home.                        -  Resume previous diet.                        - Continue present medications.                        - Await pathology results. Procedure Code(s):     --- Professional ---                        [phone], Colonoscopy, flexible; with biopsy, single or                         multiple Diagnosis Code(s):     --- Professional ---                        R93.3, Abnormal findings on  diagnostic imaging of                         other parts of digestive tract                        K63.5, Polyp of colon CPT copyright 2019 American Medical Association. All rights reserved. The codes documented in this report are preliminary and upon coder review may  be revised to meet current compliance requirements. Lucilla Lame MD, MD 10/04/2019 10:12:10 AM This report has been signed electronically. Number of Addenda: 0 Note Initiated On: 10/04/2019 9:33 AM Scope Withdrawal Time: 0 hours 6 minutes 21 seconds  Total Procedure Duration: 0 hours 8 minutes 29 seconds  Estimated Blood Loss:  Estimated blood loss: none.      Dr [facility]

## 2019-10-04 NOTE — Anesthesia Preprocedure Evaluation (Addendum)
Anesthesia Evaluation  Patient identified by MRN, date of birth, ID band Patient awake    Reviewed: Allergy & Precautions, NPO status , Patient's Chart, lab work & pertinent test results  History of Anesthesia Complications Negative for: history of anesthetic complications  Airway Mallampati: III  TM Distance: >3 FB Neck ROM: Full    Dental  (+) Teeth Intact, Upper Dentures   Pulmonary shortness of breath and at rest, neg sleep apnea, COPD, Patient abstained from smoking.Not current smoker, former smoker,  Has not smoked for 3 weeks COPD takes inhalers bid New lung mass likely cancer in process of being worked up   Pulmonary exam normal breath sounds clear to auscultation       Cardiovascular Exercise Tolerance: Good METShypertension, + CAD and + Cardiac Stents  (-) Past MI (-) dysrhythmias  Rhythm:Regular Rate:Normal - Systolic murmurs TTE 9326 unremarkable DES in 2006 Cardiac cath 2011 with patent stent and mild restenosis   Neuro/Psych  Headaches, PSYCHIATRIC DISORDERS Anxiety Depression Bipolar Disorder    GI/Hepatic neg GERD  ,(+)     (-) substance abuse  , Hepatitis -, CTreated Hep C   Endo/Other  neg diabetes  Renal/GU negative Renal ROS     Musculoskeletal   Abdominal   Peds  Hematology   Anesthesia Other Findings Past Medical History: No date: Alcohol abuse     Comment:  pt states it is marijuana No date: Anemia No date: Arthritis No date: Bipolar disorder (HCC) No date: BPH (benign prostatic hypertrophy) with urinary obstruction No date: CAD (coronary artery disease)     Comment:  a. s/p stent to LAD in 2006 at [facility], EF 60% at that               time. No date: Cancer [facility])     Comment:  prostate No date: CHF (congestive heart failure) (HCC) No date: Chronic pancreatitis (HCC) No date: COPD (chronic obstructive pulmonary disease) (HCC) No date: Depression No date: Diverticulosis No date:  Drug use No date: Dyspnea No date: ED (erectile dysfunction) No date: Gastritis No date: GERD (gastroesophageal reflux disease) No date: Headache     Comment:  occasional migraines No date: Hepatitis C     Comment:  treated No date: History of lumbar fusion No date: Hyperlipidemia No date: Hypertension     Comment:  patient denies having high blood pressure No date: Mitral regurgitation No date: Pancreatitis     Comment:  x 2  No date: Pneumonia     Comment:  07/06/18  No date: PTSD (post-traumatic stress disorder) No date: Rib fracture     Comment:  s/p fall 03/15/19  No date: Sinus disease No date: Urge incontinence  Reproductive/Obstetrics                           Anesthesia Physical Anesthesia Plan  ASA: III  Anesthesia Plan: General   Post-op Pain Management:    Induction: Intravenous  PONV Risk Score and Plan: 2 and Ondansetron, Propofol infusion and TIVA  Airway Management Planned: Nasal Cannula  Additional Equipment: None  Intra-op Plan:   Post-operative Plan:   Informed Consent: I have reviewed the patients History and Physical, chart, labs and discussed the procedure including the risks, benefits and alternatives for the proposed anesthesia with the patient or authorized representative who has indicated his/her understanding and acceptance.     Dental advisory given  Plan Discussed with: CRNA and Surgeon  Anesthesia Plan Comments: (  Discussed risks of anesthesia with patient, including possibility of difficulty with spontaneous ventilation under anesthesia necessitating airway intervention, PONV, and rare risks such as cardiac or respiratory or neurological events. Patient understands.)        Anesthesia Quick Evaluation

## 2019-10-04 NOTE — Op Note (Addendum)
[facility] Gastroenterology Patient Name: Christopher Orr Procedure Date: 10/04/2019 9:33 AM MRN: [id number] Account #: [id number] Date of Birth: 09-02-1945 Admit Type: Outpatient Age: 74 Room: [facility] ENDO ROOM 4 Gender: Male Note Status: Finalized Procedure:             Upper GI endoscopy Indications:           Cirrhosis rule out esophageal varices Providers:             Lucilla Lame MD, MD Medicines:             Propofol per Anesthesia Complications:         No immediate complications. Procedure:             Pre-Anesthesia Assessment:                        - Prior to the procedure, a History and Physical was                         performed, and patient medications and allergies were                         reviewed. The patient's tolerance of previous                         anesthesia was also reviewed. The risks and benefits                         of the procedure and the sedation options and risks                         were discussed with the patient. All questions were                         answered, and informed consent was obtained. Prior                         Anticoagulants: The patient has taken no previous                         anticoagulant or antiplatelet agents. ASA Grade                         Assessment: III - A patient with severe systemic                         disease. After reviewing the risks and benefits, the                         patient was deemed in satisfactory condition to                         undergo the procedure.                        After obtaining informed consent, the endoscope was                         passed under direct vision. Throughout the procedure,  the patient's blood pressure, pulse, and oxygen                         saturations were monitored continuously. The Endoscope                         was introduced through the mouth, and advanced to the                         second  part of duodenum. The upper GI endoscopy was                         accomplished without difficulty. The patient tolerated                         the procedure well. Findings:      The Z-line was irregular and was found at the gastroesophageal junction.      The stomach was normal.      The examined duodenum was normal. Impression:            - Z-line irregular, at the gastroesophageal junction.                        - Normal stomach.                        - Normal examined duodenum.                        - No specimens collected. Recommendation:        - Discharge patient to home.                        - Resume previous diet.                        - Continue present medications.                        - Perform a colonoscopy today. Procedure Code(s):     --- Professional ---                        [phone], Esophagogastroduodenoscopy, flexible,                         transoral; diagnostic, including collection of                         specimen(s) by brushing or washing, when performed                         (separate procedure) Diagnosis Code(s):     --- Professional ---                        K74.60, Unspecified cirrhosis of liver                        K22.8, Other specified diseases of esophagus CPT copyright 2019 American Medical Association. All rights reserved. The codes documented in this report are preliminary and upon coder review may  be revised to meet current compliance requirements. Lucilla Lame MD, MD 10/04/2019 10:15:25 AM This report has been signed electronically. Number of Addenda: 0 Note Initiated On: 10/04/2019 9:33 AM Estimated Blood Loss:  Estimated blood loss: none.      [facility]

## 2019-10-04 NOTE — Interval H&P Note (Signed)
History and Physical Interval Note:  10/04/2019 9:32 AM  Christopher Orr  has presented today for surgery, with the diagnosis of Screening for esophageal varices I85.01 Screening Z12.11.  The various methods of treatment have been discussed with the patient and family. After consideration of risks, benefits and other options for treatment, the patient has consented to  Procedure(s): COLONOSCOPY WITH PROPOFOL (N/A) ESOPHAGOGASTRODUODENOSCOPY (EGD) WITH PROPOFOL (N/A) as a surgical intervention.  The patient's history has been reviewed, patient examined, no change in status, stable for surgery.  I have reviewed the patient's chart and labs.  Questions were answered to the patient's satisfaction.     [name] [employer]

## 2019-10-04 NOTE — Transfer of Care (Signed)
Immediate Anesthesia Transfer of Care Note  Patient: Christopher Orr  Procedure(s) Performed: COLONOSCOPY WITH PROPOFOL (N/A ) ESOPHAGOGASTRODUODENOSCOPY (EGD) WITH PROPOFOL (N/A )  Patient Location: PACU  Anesthesia Type:General  Level of Consciousness: awake and sedated  Airway & Oxygen Therapy: Patient Spontanous Breathing and Patient connected to nasal cannula oxygen  Post-op Assessment: Report given to RN and Post -op Vital signs reviewed and stable  Post vital signs: Reviewed and stable  Last Vitals:  Vitals Value Taken Time  BP 111/69 10/04/19 1015  Temp    Pulse 82 10/04/19 1015  Resp 11 10/04/19 1015  SpO2 90 % 10/04/19 1015  Vitals shown include unvalidated device data.  Last Pain:  Vitals:   10/04/19 0921  TempSrc: Tympanic  PainSc: 0-No pain         Complications: No apparent anesthesia complications

## 2019-10-05 ENCOUNTER — Encounter: Payer: Self-pay | Admitting: *Deleted

## 2019-10-05 LAB — SURGICAL PATHOLOGY

## 2019-10-05 NOTE — Anesthesia Postprocedure Evaluation (Signed)
Anesthesia Post Note  Patient: JAYLENE SCHROM  Procedure(s) Performed: COLONOSCOPY WITH PROPOFOL (N/A ) ESOPHAGOGASTRODUODENOSCOPY (EGD) WITH PROPOFOL (N/A )  Patient location during evaluation: Endoscopy Anesthesia Type: General Level of consciousness: awake and alert Pain management: pain level controlled Vital Signs Assessment: post-procedure vital signs reviewed and stable Respiratory status: spontaneous breathing, nonlabored ventilation, respiratory function stable and patient connected to nasal cannula oxygen Cardiovascular status: blood pressure returned to baseline and stable Postop Assessment: no apparent nausea or vomiting Anesthetic complications: no     Last Vitals:  Vitals:   10/04/19 1035 10/04/19 1045  BP: 114/85 124/84  Pulse: (!) 104 74  Resp: 17 14  Temp:    SpO2: (!) 82% 93%    Last Pain:  Vitals:   10/04/19 1045  TempSrc:   PainSc: 0-No pain                 Arita Miss

## 2019-10-06 ENCOUNTER — Encounter: Payer: Self-pay | Admitting: Gastroenterology

## 2019-10-07 ENCOUNTER — Ambulatory Visit: Payer: Medicare HMO | Admitting: Cardiothoracic Surgery

## 2019-10-07 ENCOUNTER — Telehealth: Payer: Self-pay

## 2019-10-07 ENCOUNTER — Other Ambulatory Visit: Payer: Self-pay

## 2019-10-07 ENCOUNTER — Encounter: Payer: Self-pay | Admitting: Cardiothoracic Surgery

## 2019-10-07 VITALS — BP 138/83 | HR 74 | Temp 98.1°F | Resp 14 | Ht 72.0 in | Wt 264.0 lb

## 2019-10-07 DIAGNOSIS — R918 Other nonspecific abnormal finding of lung field: Secondary | ICD-10-CM

## 2019-10-07 NOTE — Progress Notes (Signed)
  Patient ID: Christopher Orr, male   DOB: 27-Aug-1945, 74 y.o.   MRN: [id number]  HISTORY: [name] comes in today to discuss the results of his colonoscopy and ENT evaluation.  He underwent a PET scan which revealed uptake in the left parotid as well as in the perirectal region.  His colonoscopy showed some benign polyps.  The ENT evaluation suggested a benign lesion.  He had some pulmonary function studies done in the past which reveal an FEV1 of approximately 70% and a DLCO of 50%.  He stopped smoking now for over a month.  Prior to that he smoked quite heavily and does have a significant history of COPD.   Vitals:   10/07/19 1131  BP: 138/83  Pulse: 74  Resp: 14  Temp: 98.1 F (36.7 C)  SpO2: 99%     EXAM:    Resp: Lungs are clear bilaterally.  No respiratory distress, normal effort. Heart:  Regular without murmurs Abd:  Abdomen is soft, non distended and non tender. No masses are palpable.  There is no rebound and no guarding.  Neurological: Alert and oriented to person, place, and time. Coordination normal.  Skin: Skin is warm and dry. No rash noted. No diaphoretic. No erythema. No pallor.  Psychiatric: Normal mood and affect. Normal behavior. Judgment and thought content normal.    ASSESSMENT: I have independently reviewed his chest CT and his PET scan.  There is a dominant mass in the left lower lobe.  It does have the radiographic appearance of a malignancy.  However biopsy has not yet been obtained.   PLAN:   I explained to the patient that oftentimes surgical resection is performed without a biopsy.  However he is concerned about this and would like to undergo a percutaneous biopsy if that is possible.  We will go ahead and organize that and I will see him back in follow-up.    Nestor Lewandowsky, MD

## 2019-10-07 NOTE — Patient Instructions (Addendum)
We will get you scheduled for a CT guided needle biopsy of the lung. You will need a Covid-19 test done prior to this. We will call you with this information.  We will have you follow up here after this has been completed.  Please see your appointment below.

## 2019-10-07 NOTE — Telephone Encounter (Signed)
Marcie Bal from [facility] called stating patient is scheduled on 11/02/19 for Ultrasound Guided Parotid Biopsy. She was asking if Dr.Oaks would like for the patient to have the Lung Biopsy after or before this procedure. She states she suggest the ordering doctor and Dr.Oaks may discuss with one another and go from there. Notified her Dr.Oaks had left for the day and we would speak with him Monday and let their facility know. Marcie Bal verbalized understanding.

## 2019-10-13 ENCOUNTER — Other Ambulatory Visit
Admission: RE | Admit: 2019-10-13 | Discharge: 2019-10-13 | Disposition: A | Discharge status: Home / Self Care | Payer: Medicare HMO | Source: Ambulatory Visit | Attending: Cardiothoracic Surgery | Admitting: Cardiothoracic Surgery

## 2019-10-13 ENCOUNTER — Other Ambulatory Visit: Payer: Self-pay | Admitting: Radiology

## 2019-10-13 DIAGNOSIS — Z01812 Encounter for preprocedural laboratory examination: Secondary | ICD-10-CM | POA: Insufficient documentation

## 2019-10-13 DIAGNOSIS — Z20822 Contact with and (suspected) exposure to covid-19: Secondary | ICD-10-CM | POA: Insufficient documentation

## 2019-10-13 LAB — SARS CORONAVIRUS 2 (TAT 6-24 HRS): SARS Coronavirus 2: NEGATIVE

## 2019-10-14 ENCOUNTER — Telehealth: Payer: Self-pay

## 2019-10-14 NOTE — Telephone Encounter (Signed)
Notified patient that he is scheduled for his CT guided lung Biopsy at [facility] on 10/17/19. He will need to arrive at the [place] entrance at 8:30 am. He will stop his Plavix 5 days prior and restart it the day after the biopsy.  He will have his Covid-19 test on 10/13/19 at the [facility] between 8-10:30 am.  He is aware the a nurse with radiology will be calling him with further instructions.

## 2019-10-17 ENCOUNTER — Ambulatory Visit: Admission: RE | Admit: 2019-10-17 | Payer: Medicare HMO | Source: Ambulatory Visit

## 2019-10-17 ENCOUNTER — Ambulatory Visit
Admission: RE | Admit: 2019-10-17 | Discharge: 2019-10-17 | Disposition: A | Discharge status: Home / Self Care | Payer: Medicare HMO | Source: Ambulatory Visit | Attending: Cardiothoracic Surgery | Admitting: Cardiothoracic Surgery

## 2019-10-17 ENCOUNTER — Ambulatory Visit
Admission: RE | Admit: 2019-10-17 | Discharge: 2019-10-17 | Disposition: A | Discharge status: Home / Self Care | Payer: Medicare HMO | Source: Ambulatory Visit | Attending: Interventional Radiology | Admitting: Interventional Radiology

## 2019-10-17 ENCOUNTER — Ambulatory Visit
Admission: RE | Admit: 2019-10-17 | Discharge: 2019-10-17 | Disposition: A | Discharge status: Home / Self Care | Payer: Medicare HMO | Source: Ambulatory Visit | Attending: Unknown Physician Specialty | Admitting: Unknown Physician Specialty

## 2019-10-17 ENCOUNTER — Other Ambulatory Visit: Payer: Self-pay

## 2019-10-17 DIAGNOSIS — I110 Hypertensive heart disease with heart failure: Secondary | ICD-10-CM | POA: Insufficient documentation

## 2019-10-17 DIAGNOSIS — Z7951 Long term (current) use of inhaled steroids: Secondary | ICD-10-CM | POA: Diagnosis not present

## 2019-10-17 DIAGNOSIS — Z8619 Personal history of other infectious and parasitic diseases: Secondary | ICD-10-CM | POA: Insufficient documentation

## 2019-10-17 DIAGNOSIS — Z8546 Personal history of malignant neoplasm of prostate: Secondary | ICD-10-CM | POA: Insufficient documentation

## 2019-10-17 DIAGNOSIS — Z79899 Other long term (current) drug therapy: Secondary | ICD-10-CM | POA: Diagnosis not present

## 2019-10-17 DIAGNOSIS — D490 Neoplasm of unspecified behavior of digestive system: Secondary | ICD-10-CM | POA: Diagnosis not present

## 2019-10-17 DIAGNOSIS — J449 Chronic obstructive pulmonary disease, unspecified: Secondary | ICD-10-CM | POA: Diagnosis not present

## 2019-10-17 DIAGNOSIS — R918 Other nonspecific abnormal finding of lung field: Secondary | ICD-10-CM | POA: Diagnosis not present

## 2019-10-17 DIAGNOSIS — Z981 Arthrodesis status: Secondary | ICD-10-CM | POA: Insufficient documentation

## 2019-10-17 DIAGNOSIS — C3432 Malignant neoplasm of lower lobe, left bronchus or lung: Secondary | ICD-10-CM | POA: Diagnosis not present

## 2019-10-17 DIAGNOSIS — K7460 Unspecified cirrhosis of liver: Secondary | ICD-10-CM | POA: Insufficient documentation

## 2019-10-17 DIAGNOSIS — I509 Heart failure, unspecified: Secondary | ICD-10-CM | POA: Insufficient documentation

## 2019-10-17 DIAGNOSIS — M1990 Unspecified osteoarthritis, unspecified site: Secondary | ICD-10-CM | POA: Insufficient documentation

## 2019-10-17 DIAGNOSIS — Z8249 Family history of ischemic heart disease and other diseases of the circulatory system: Secondary | ICD-10-CM | POA: Diagnosis not present

## 2019-10-17 DIAGNOSIS — K118 Other diseases of salivary glands: Secondary | ICD-10-CM | POA: Diagnosis not present

## 2019-10-17 DIAGNOSIS — Z87891 Personal history of nicotine dependence: Secondary | ICD-10-CM | POA: Insufficient documentation

## 2019-10-17 DIAGNOSIS — I2510 Atherosclerotic heart disease of native coronary artery without angina pectoris: Secondary | ICD-10-CM | POA: Diagnosis not present

## 2019-10-17 DIAGNOSIS — Z9889 Other specified postprocedural states: Secondary | ICD-10-CM

## 2019-10-17 DIAGNOSIS — E785 Hyperlipidemia, unspecified: Secondary | ICD-10-CM | POA: Diagnosis not present

## 2019-10-17 DIAGNOSIS — Z7902 Long term (current) use of antithrombotics/antiplatelets: Secondary | ICD-10-CM | POA: Insufficient documentation

## 2019-10-17 DIAGNOSIS — D37030 Neoplasm of uncertain behavior of the parotid salivary glands: Secondary | ICD-10-CM

## 2019-10-17 DIAGNOSIS — D110 Benign neoplasm of parotid gland: Secondary | ICD-10-CM | POA: Insufficient documentation

## 2019-10-17 DIAGNOSIS — R911 Solitary pulmonary nodule: Secondary | ICD-10-CM | POA: Diagnosis not present

## 2019-10-17 DIAGNOSIS — K219 Gastro-esophageal reflux disease without esophagitis: Secondary | ICD-10-CM | POA: Diagnosis not present

## 2019-10-17 DIAGNOSIS — F319 Bipolar disorder, unspecified: Secondary | ICD-10-CM | POA: Diagnosis not present

## 2019-10-17 DIAGNOSIS — Z955 Presence of coronary angioplasty implant and graft: Secondary | ICD-10-CM | POA: Insufficient documentation

## 2019-10-17 LAB — CBC WITH DIFFERENTIAL/PLATELET
Abs Immature Granulocytes: 0.03 10*3/uL (ref 0.00–0.07)
Basophils Absolute: 0.1 10*3/uL (ref 0.0–0.1)
Basophils Relative: 1 %
Eosinophils Absolute: 0.4 10*3/uL (ref 0.0–0.5)
Eosinophils Relative: 3 %
HCT: 40.5 % (ref 39.0–52.0)
Hemoglobin: 13.4 g/dL (ref 13.0–17.0)
Immature Granulocytes: 0 %
Lymphocytes Relative: 13 %
Lymphs Abs: 1.7 10*3/uL (ref 0.7–4.0)
MCH: 33 pg (ref 26.0–34.0)
MCHC: 33.1 g/dL (ref 30.0–36.0)
MCV: 99.8 fL (ref 80.0–100.0)
Monocytes Absolute: 0.9 10*3/uL (ref 0.1–1.0)
Monocytes Relative: 7 %
Neutro Abs: 9.6 10*3/uL — ABNORMAL HIGH (ref 1.7–7.7)
Neutrophils Relative %: 76 %
Platelets: 228 10*3/uL (ref 150–400)
RBC: 4.06 MIL/uL — ABNORMAL LOW (ref 4.22–5.81)
RDW: 14.3 % (ref 11.5–15.5)
WBC: 12.6 10*3/uL — ABNORMAL HIGH (ref 4.0–10.5)
nRBC: 0 % (ref 0.0–0.2)

## 2019-10-17 LAB — PROTIME-INR
INR: 0.9 (ref 0.8–1.2)
Prothrombin Time: 12.4 seconds (ref 11.4–15.2)

## 2019-10-17 MED ORDER — ONDANSETRON 4 MG PO TBDP
4.0000 mg | ORAL_TABLET | Freq: Once | ORAL | Status: AC
  Filled 2019-10-17: qty 1

## 2019-10-17 MED ORDER — ALBUTEROL SULFATE (2.5 MG/3ML) 0.083% IN NEBU
INHALATION_SOLUTION | RESPIRATORY_TRACT | Status: AC
  Filled 2019-10-17: qty 3

## 2019-10-17 MED ORDER — FENTANYL CITRATE (PF) 100 MCG/2ML IJ SOLN
INTRAMUSCULAR | Status: AC | PRN
  Administered 2019-10-17 (×3): 50 ug via INTRAVENOUS

## 2019-10-17 MED ORDER — MIDAZOLAM HCL 2 MG/2ML IJ SOLN
INTRAMUSCULAR | Status: AC | PRN
  Administered 2019-10-17 (×3): 1 mg via INTRAVENOUS

## 2019-10-17 MED ORDER — MIDAZOLAM HCL 2 MG/2ML IJ SOLN
INTRAMUSCULAR | Status: AC
  Filled 2019-10-17: qty 4

## 2019-10-17 MED ORDER — FENTANYL CITRATE (PF) 100 MCG/2ML IJ SOLN
INTRAMUSCULAR | Status: AC
  Filled 2019-10-17: qty 4

## 2019-10-17 MED ORDER — SODIUM CHLORIDE 0.9 % IV SOLN: INTRAVENOUS | Status: DC

## 2019-10-17 MED ORDER — ONDANSETRON 4 MG PO TBDP
ORAL_TABLET | ORAL | Status: AC
  Administered 2019-10-17: 4 mg via ORAL
  Filled 2019-10-17: qty 1

## 2019-10-17 MED ORDER — ALBUTEROL SULFATE (2.5 MG/3ML) 0.083% IN NEBU
2.5000 mg | INHALATION_SOLUTION | Freq: Once | RESPIRATORY_TRACT | Status: AC
  Administered 2019-10-17: 2.5 mg via RESPIRATORY_TRACT
  Filled 2019-10-17: qty 3

## 2019-10-17 NOTE — Discharge Instructions (Signed)
Needle Biopsy, Care After This sheet gives you information about how to care for yourself after your procedure. Your health care provider may also give you more specific instructions. If you have problems or questions, contact your health care provider. What can I expect after the procedure? After the procedure, it is common to have soreness, bruising, or mild pain at the puncture site. This should go away in a few days. Follow these instructions at home: Needle insertion site care   Wash your hands with soap and water before you change your bandage (dressing). If you cannot use soap and water, use hand sanitizer.  Follow instructions from your health care provider about how to take care of your puncture site. This includes: ? When and how to change your dressing. ? When to remove your dressing.  Check your puncture site every day for signs of infection. Check for: ? Redness, swelling, or pain. ? Fluid or blood. ? Pus or a bad smell. ? Warmth. General instructions  Return to your normal activities as told by your health care provider. Ask your health care provider what activities are safe for you.  Do not take baths, swim, or use a hot tub until your health care provider approves. Ask your health care provider if you may take showers. You may only be allowed to take sponge baths.  Take over-the-counter and prescription medicines only as told by your health care provider.  Keep all follow-up visits as told by your health care provider. This is important. Contact a health care provider if:  You have a fever.  You have redness, swelling, or pain at the puncture site that lasts longer than a few days.  You have fluid, blood, or pus coming from your puncture site.  Your puncture site feels warm to the touch. Get help right away if:  You have severe bleeding from the puncture site. Summary  After the procedure, it is common to have soreness, bruising, or mild pain at the puncture  site. This should go away in a few days.  Check your puncture site every day for signs of infection, such as redness, swelling, or pain.  Get help right away if you have severe bleeding from your puncture site. This information is not intended to replace advice given to you by your health care provider. Make sure you discuss any questions you have with your health care provider. Document Revised: 09/25/2017 Document Reviewed: 07/27/2017 Elsevier Patient Education  2020 [employer] Inc[employer]  

## 2019-10-17 NOTE — Sedation Documentation (Signed)
Nausea resolved post zofran

## 2019-10-17 NOTE — Procedures (Signed)
  Procedure: CT core bx LLL lung nodule    [place] core bx L parotid nodule   EBL:   minimal Complications:  none immediate  See full dictation in [employer].  Dillard Cannon MD Main # [phone] Pager  [phone]

## 2019-10-17 NOTE — OR Nursing (Signed)
Pr reports he had only couple sips of plain coffee at 6:30 or 7 am though he was instructed to be NPO except water with Meds. Dr Vernard Gambles aware. Pt reported diarrhea this am but that has resolved, but slightly nauseated. Zofran order received from [facility] radiology PA Tillar. Pt denies exposure to COVID. Covid test 3/18 negative. Pt reports he has not been anywhere since then.

## 2019-10-17 NOTE — Sedation Documentation (Signed)
Second procedure [place] parotid node biopsy

## 2019-10-17 NOTE — Consult Note (Signed)
Chief Complaint: Hypermetabolic left lower lobe nodule and left parotid gland  Referring Physician(s): Dr. Eyvonne Mechanic - LLL biopsy  Dr. Gemma Payor - left parotid gland biopsy   Supervising Physician: Arne Cleveland  Patient Status: [facility] - Out-pt  History of Present Illness: Christopher Orr is a 74 y.o. male Smoker, history of prostate cancer, hep C, cirrohosis, COPD, CAD s/p stent (on plavix) found to initially have a left lower lobe pulmonary nodule while being worked up for renal lesions.  Team is requesting left lower lung biopsy and left parotid lymph node biopsy for further determination of possible new primary.  Past Medical History:  Diagnosis Date  . Alcohol abuse    pt states it is marijuana  . Anemia   . Arthritis   . Bipolar disorder ([place])   . BPH (benign prostatic hypertrophy) with urinary obstruction   . CAD (coronary artery disease)    a. s/p stent to LAD in 2006 at [facility], EF 60% at that time.  . Cancer [employer])    prostate  . CHF (congestive heart failure) ([place])   . Chronic pancreatitis ([place])   . COPD (chronic obstructive pulmonary disease) ([place])   . Depression   . Diverticulosis   . Drug use   . Dyspnea   . ED (erectile dysfunction)   . Gastritis   . GERD (gastroesophageal reflux disease)   . Headache    occasional migraines  . Hepatitis C    treated  . History of lumbar fusion   . Hyperlipidemia   . Hypertension    patient denies having high blood pressure  . Mitral regurgitation   . Pancreatitis    x 2   . Pneumonia    07/06/18   . PTSD (post-traumatic stress disorder)   . Rib fracture    s/p fall 03/15/19   . Sinus disease   . Urge incontinence     Past Surgical History:  Procedure Laterality Date  . cateract  2013  . COLONOSCOPY WITH PROPOFOL N/A 10/04/2019   Procedure: COLONOSCOPY WITH PROPOFOL;  Surgeon: Lucilla Lame, MD;  Location: [facility] ENDOSCOPY;  Service: Endoscopy;  Laterality: N/A;  . CORONARY STENT PLACEMENT  2006   x 1  .  ESOPHAGOGASTRODUODENOSCOPY N/A 12/22/2014   Procedure: ESOPHAGOGASTRODUODENOSCOPY (EGD);  Surgeon: [name], MD;  Location: [facility] ENDOSCOPY;  Service: Endoscopy;  Laterality: N/A;  . ESOPHAGOGASTRODUODENOSCOPY (EGD) WITH PROPOFOL N/A 10/04/2019   Procedure: ESOPHAGOGASTRODUODENOSCOPY (EGD) WITH PROPOFOL;  Surgeon: Lucilla Lame, MD;  Location: [facility] ENDOSCOPY;  Service: Endoscopy;  Laterality: N/A;  . HERNIA REPAIR  2015   left groin  . LUMBAR FUSION    . RADIOACTIVE SEED IMPLANT N/A 04/22/2016   Procedure: RADIOACTIVE SEED IMPLANT/BRACHYTHERAPY IMPLANT;  Surgeon: Hollice Espy, MD;  Location: [facility] ORS;  Service: Urology;  Laterality: N/A;  . ROBOT ASSISTED INGUINAL HERNIA REPAIR Bilateral 07/06/2018   Procedure: ROBOT ASSISTED INGUINAL HERNIA REPAIR;  Surgeon: Jules Husbands, MD;  Location: [facility] ORS;  Service: General;  Laterality: Bilateral;  . TONSILLECTOMY    . UMBILICAL HERNIA REPAIR N/A 07/06/2018   Procedure: LAPAROSCOPIC ROBOT ASSISTED UMBILICAL HERNIA;  Surgeon: Jules Husbands, MD;  Location: [facility] ORS;  Service: General;  Laterality: N/A;    Allergies: Patient has no known allergies.  Medications: Prior to Admission medications   Medication Sig Start Date End Date Taking? Authorizing Provider  albuterol (PROVENTIL) (2.5 MG/3ML) 0.083% nebulizer solution Take 3 mLs (2.5 mg total) by nebulization every 4 (four) hours as needed  for wheezing or shortness of breath. 09/13/19   McLean-Scocuzza, [name], MD  alprazolam Duanne Moron) 1 MG tablet Take 1 tablet (1 mg total) by mouth as directed. 1/2 in am 1 pill qhs 08/12/19   McLean-Scocuzza, [name], MD  ARIPiprazole (ABILIFY) 10 MG tablet Take 10 mg by mouth daily.    [provider]  aspirin 81 MG tablet Take 81 mg by mouth daily. Reported on 01/15/2016    [provider]  budesonide-formoterol (SYMBICORT) 160-4.5 MCG/ACT inhaler Inhale 2 puffs into the lungs 2 (two) times daily. Rinse mouth out 08/15/19   McLean-Scocuzza,  [name], MD  celecoxib (CELEBREX) 200 MG capsule Take 1 capsule (200 mg total) by mouth daily after breakfast. 08/15/19   McLean-Scocuzza, [name], MD  clopidogrel (PLAVIX) 75 MG tablet Take 1 tablet (75 mg total) by mouth daily. Reported on 01/15/2016 08/15/19   McLean-Scocuzza, [name], MD  cyanocobalamin 1000 MCG tablet Take 1,000 mcg by mouth daily.    [provider]  donepezil (ARICEPT) 5 MG tablet Take 1 tablet (5 mg total) by mouth at bedtime. 08/15/19   McLean-Scocuzza, [name], MD  FLUoxetine (PROZAC) 20 MG capsule Take 60 mg by mouth daily. 06/14/18   [provider]  folic acid (FOLVITE) 1 MG tablet TAKE 1 TABLET BY MOUTH EVERY DAY 04/13/19   Guse, Jacquelynn Cree, FNP  memantine (NAMENDA) 5 MG tablet Take 1 tablet (5 mg total) by mouth 2 (two) times daily. Taking qd 08/15/19   McLean-Scocuzza, [name], MD  metoprolol succinate (TOPROL-XL) 25 MG 24 hr tablet Take 1 tablet (25 mg total) by mouth daily. 08/15/19   McLean-Scocuzza, [name], MD  Multiple Vitamins-Minerals (MULTIVITAMIN WITH MINERALS) tablet Take 1 tablet by mouth daily.    [provider]  MYRBETRIQ 50 MG TB24 tablet TAKE 1 TABLET BY MOUTH EVERY DAY 08/18/19   Hollice Espy, MD  omega-3 fish oil (MAXEPA) 1000 MG CAPS capsule Take 1 capsule by mouth daily.  05/08/18   [provider]  pantoprazole (PROTONIX) 40 MG tablet Take 1 tablet (40 mg total) by mouth daily. 30 min before breakfast 08/15/19   McLean-Scocuzza, [name], MD  rosuvastatin (CRESTOR) 10 MG tablet Take 1 tablet (10 mg total) by mouth at bedtime. 08/15/19   McLean-Scocuzza, [name], MD  tamsulosin (FLOMAX) 0.4 MG CAPS capsule Take 1 capsule (0.4 mg total) by mouth daily after supper. 08/15/19   McLean-Scocuzza, [name], MD  traZODone (DESYREL) 150 MG tablet Take 1 tablet (150 mg total) by mouth at bedtime. 08/15/19   McLean-Scocuzza, [name], MD     Family History  Problem Relation Age of Onset  . Heart disease Father        Unclear  details. Father passed in late 33's 2/2 cancer.  . Colon cancer Father   . Alcohol abuse Sister   . Drug abuse Sister   . Anxiety disorder Sister   . Depression Sister   . Kidney disease Neg Hx   . Prostate cancer Neg Hx     Social History   Socioeconomic History  . Marital status: Divorced    Spouse name: Not on file  . Number of children: 4  . Years of education: Not on file  . Highest education level: GED or equivalent  Occupational History  . Occupation: Aeronautical engineer    Comment: retired  Tobacco Use  . Smoking status: Former Smoker    Types: Cigarettes    Start date: 07/28/1962  Quit date: 09/01/2019    Years since quitting: 0.1  . Smokeless tobacco: Never Used  Substance and Sexual Activity  . Alcohol use: Yes    Alcohol/week: 6.0 standard drinks    Types: 6 Cans of beer per week  . Drug use: Yes    Frequency: 7.0 times per week    Types: Marijuana    Comment: Endorsed heroin 06/2014, UDS also + benzos, opiates, THC, neg for cocaine at that time.  Marland Kitchen Sexual activity: Not Currently  Other Topics Concern  . Not on file  Social History Narrative   Lives at home    Has kids and grandkids   Smoker x 55 years 1 ppd as of 07/2019 he did quit x 2 years    Drinks 1 pt liq qd as of 07/2019    Social Determinants of Health   Financial Resource Strain: Low Risk   . Difficulty of Paying Living Expenses: Not very hard  Food Insecurity: Unknown  . Worried About Charity fundraiser in the Last Year: Never true  . Ran Out of Food in the Last Year: Not on file  Transportation Needs:   . Lack of Transportation (Medical):   Mar[place] Kitchen Lack of Transportation (Non-Medical):   Physical Activity:   . Days of Exercise per Week:   . Minutes of Exercise per Session:   Stress:   . Feeling of Stress :   Social Connections:   . Frequency of Communication with Friends and Family:   . Frequency of Social Gatherings with Friends and Family:   . Attends Religious Services:   . Active  Member of Clubs or Organizations:   . Attends Archivist Meetings:   Marland Kitchen Marital Status:      Review of Systems: A 12 point ROS discussed and pertinent positives are indicated in the HPI above.  All other systems are negative.  Review of Systems  Constitutional: Negative for fever.  HENT: Negative for congestion.   Respiratory: Positive for shortness of breath ( baseline). Negative for cough.   Cardiovascular: Negative for chest pain.  Gastrointestinal: Positive for diarrhea ( self resolved this morning) and nausea. Negative for abdominal pain.  Musculoskeletal: Positive for back pain and myalgias ( left leg).  Neurological: Negative for headaches.  Psychiatric/Behavioral: Negative for behavioral problems and confusion.    Vital Signs: BP 108/90   Resp 12   Ht 6' (1.829 m)   Wt 265 lb (120.2 kg)   BMI 35.94 kg/m   Physical Exam Vitals and nursing note reviewed.  Constitutional:      Appearance: He is well-developed.  HENT:     Head: Normocephalic.  Cardiovascular:     Rate and Rhythm: Normal rate and regular rhythm.     Heart sounds: Normal heart sounds.  Pulmonary:     Effort: Pulmonary effort is normal.     Breath sounds: Normal breath sounds.  Musculoskeletal:        General: Normal range of motion.     Cervical back: Normal range of motion.  Skin:    General: Skin is dry.  Neurological:     Mental Status: He is alert and oriented to person, place, and time.     Imaging: No results found.  Labs:  CBC: Recent Labs    09/14/19 0947 10/17/19 0903  WBC 7.2 12.6*  HGB 13.2 13.4  HCT 39.3 40.5  PLT 228.0 228    COAGS: No results for input(s): INR, APTT in the last 8760  hours.  BMP: Recent Labs    08/30/19 0854 09/14/19 0947  NA  --  140  K  --  4.7  CL  --  104  CO2  --  31  GLUCOSE  --  96  BUN  --  17  CALCIUM  --  8.9  CREATININE 1.20 1.19    LIVER FUNCTION TESTS: Recent Labs    09/14/19 0947  BILITOT 0.5  AST 19  ALT 18   ALKPHOS 92  PROT 6.7  ALBUMIN 4.0    Assessment and Plan:  75 y.o, male outpatient. Smoker, history of prostate cancer, hep C, cirrohosis, COPD, CAD s/p stent (on plavix) found to initially have a left lower lobe pulmonary nodule while being worked up for renal lesions.  Team is requesting left lower lung biopsy and left parotid lymph node biopsy for further determination of possible new primary.  Per note from Dr. Faith Rogue dated 3.12.21 patient has elected for lung biopsy over resection at this time  Pertinent Imaging 2.10.21 - PET reads The left lower lobe pulmonary nodule is mildly hypermetabolic, suspicious for bronchogenic carcinoma or a solitary metastasis. No other suspicious pulmonary nodulesHypermetabolic left parotid mass, likely an incidental benign neoplasm such as Warthin's tumor. ENT evaluation recommended  Pertinent IR History none  Pertinent Allergies NKDA   WBC is 12.6 labs are within acceptable parameters. Patient is on plavix for cardiac stent in 2006.  Patient states that he has held the palvix as per instructions (3.16.21) Patient is afebrile.COVID negative on 3.18.21   IR consulted for possible left lower lung biopsy and left parotid gland biopsy. Case has been reviewed and procedure approved by Dr. Vernard Gambles.  Patient scheduled for 3.22.21.  Team instructed to: Keep Patient to be NPO after midnight Hold prophylactic anticoagulation 5 days prior to scheduled procedure.   Risks and benefits of left lower lung biopsy and left parotid gland biopsy  was discussed with the patient including, but not limited to bleeding, infection, damage to adjacent structures or low yield requiring additional tests.  All of the questions were answered and there is agreement to proceed.  Consent signed and in chart.   Thank you for this interesting consult.  I greatly enjoyed meeting Christopher Orr and look forward to participating in their care.  A copy of this report was sent to  the requesting provider on this date.  Electronically Signed: Avel Peace, NP 10/17/2019, 9:40 AM   I spent a total of  40 Minutes   in face to face in clinical consultation, greater than 50% of which was counseling/coordinating care for left lower lung biopsy and left parotid lymph node biopsy

## 2019-10-18 ENCOUNTER — Other Ambulatory Visit: Payer: Self-pay | Admitting: Cardiothoracic Surgery

## 2019-10-18 LAB — SURGICAL PATHOLOGY

## 2019-10-20 ENCOUNTER — Other Ambulatory Visit: Payer: Medicare HMO

## 2019-10-20 NOTE — Progress Notes (Signed)
Tumor Board Documentation  Christopher Orr was presented by Dr Genevive Bi at our Tumor Board on 10/20/2019, which included representatives from medical oncology, radiation oncology, navigation, pathology, radiology, surgical, surgical oncology, internal medicine, genetics, palliative care, research.  Christopher Orr currently presents as a new patient, for [place], for new positive pathology with history of the following treatments: active survellience, surgical intervention(s).  Additionally, we reviewed previous medical and familial history, history of present illness, and recent lab results along with all available histopathologic and imaging studies. The tumor board considered available treatment options and made the following recommendations: Surgery Refer to Dr Genevive Bi  The following procedures/referrals were also placed: No orders of the defined types were placed in this encounter.   Clinical Trial Status: not discussed   Staging used: Pathologic Stage  AJCC Staging: T: 1C N: 0 M: X Group: Stage 1C Squamous Cell Lung Cancer   National site-specific guidelines   were discussed with respect to the case.  Tumor board is a meeting of clinicians from various specialty areas who evaluate and discuss patients for whom a multidisciplinary approach is being considered. Final determinations in the plan of care are those of the provider(s). The responsibility for follow up of recommendations given during tumor board is that of the provider.   Today's extended care, comprehensive team conference, J[place]ard was not present for the discussion and was not examined.   Multidisciplinary Tumor Board is a multidisciplinary case peer review process.  Decisions discussed in the Multidisciplinary Tumor Board reflect the opinions of the specialists present at the conference without having examined the patient.  Ultimately, treatment and diagnostic decisions rest with the primary provider(s) and the patient.

## 2019-10-21 ENCOUNTER — Encounter: Payer: Self-pay | Admitting: Cardiothoracic Surgery

## 2019-10-21 ENCOUNTER — Ambulatory Visit: Payer: Medicare HMO | Admitting: Cardiothoracic Surgery

## 2019-10-21 ENCOUNTER — Other Ambulatory Visit: Payer: Self-pay

## 2019-10-21 VITALS — BP 127/77 | HR 78 | Temp 97.2°F | Ht 72.0 in | Wt 265.8 lb

## 2019-10-21 DIAGNOSIS — R918 Other nonspecific abnormal finding of lung field: Secondary | ICD-10-CM

## 2019-10-21 NOTE — Progress Notes (Signed)
  Patient ID: Christopher Orr, male   DOB: 1946-02-21, 74 y.o.   MRN: [id number]  HISTORY: He returns today in follow-up.  He has no new complaints.  He tolerated his lung and parotid biopsies without difficulty.  He is accompanied today by his daughter.   Vitals:   10/21/19 0951  BP: 127/77  Pulse: 78  Temp: (!) 97.2 F (36.2 C)  SpO2: (!) 87%     EXAM:    Resp: Lungs are clear bilaterally.  No respiratory distress, normal effort. Heart:  Regular without murmurs Abd:  Abdomen is soft, non distended and non tender. No masses are palpable.  There is no rebound and no guarding.  Neurological: Alert and oriented to person, place, and time. Coordination normal.  Skin: Skin is warm and dry. No rash noted. No diaphoretic. No erythema. No pallor.  Psychiatric: Normal mood and affect. Normal behavior. Judgment and thought content normal.    ASSESSMENT: I have independently reviewed the patient's results.  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  The biopsy of the left lower lobe revealed a squamous cell carcinoma.   PLAN:   I had a long discussion with the patient regarding the indications and risks of surgical resection for his lung tumor.  I explained to him that given the limited nature of the disease that it was likely that surgical intervention would offer him the best chance of cure.  I reviewed with him the options for surgical resection to include robotic, thoracoscopic or open procedures.  This may also include wedge resection or lobectomy.  I also explained to him that because my current physical limitations I was unable to provide those services here but would refer him to my colleagues in [place].  He understands and would like to go to [facility] for surgery consultation.  Also we would like to provide his postoperative care here at [facility].  All of his questions were answered.  I explained to him that if he did not hear from the [place] office in the next day  or so that he should contact me to review.    Nestor Lewandowsky, MD

## 2019-10-21 NOTE — Patient Instructions (Addendum)
Dr.Oaks discussed with patient the surgical treatment option at today's visit with patient and family. Dr.Oaks discuss with patient and family that he would discuss the next steps with Dr.Hendrickson as far as surgery. Advised patient if he has not heard from [place] office by Tuesday. Give our office a call to follow up.  Lung Cancer Lung cancer is an abnormal growth of cancerous cells that forms a mass (malignant tumor) in a lung. There are several types of lung cancer. The types are based on the appearance of the tumor cells. The two most common types are:  Non-small cell lung cancer. This type of lung cancer is the most common type. Non-small cell lung cancers include squamous cell carcinoma, adenocarcinoma, and large cell carcinoma.  Small cell lung cancer. In this type of lung cancer, abnormal cells are smaller than those of non-small cell lung cancer. Small cell lung cancer gets worse (progresses) faster than non-small cell lung cancer. What are the causes? The most common cause of lung cancer is smoking tobacco. The second most common cause is exposure to a chemical called radon. What increases the risk? You are more likely to develop this condition if:  You smoke tobacco.  You have been exposed to: ? Secondhand tobacco smoke. ? Radon gas. ? Uranium. ? Asbestos. ? Arsenic in drinking water. ? Air pollution.  You have a family or personal history of lung cancer.  You have had lung radiation therapy in the past.  You are older than age 7. What are the signs or symptoms? In the early stages, you may not have any symptoms. As the cancer progresses, symptoms may include:  A lasting cough, possibly with blood.  Fatigue.  Unexplained weight loss.  Shortness of breath.  Loud breathing (wheezing).  Chest pain.  Loss of appetite. Symptoms of advanced lung cancer include:  Hoarseness.  Bone or joint pain.  Weakness.  Change in the structure of the  fingernails (clubbing), so that the nail looks like an upside-down spoon.  Swelling of the face or arms.  Inability to move the face (paralysis).  Drooping eyelids. How is this diagnosed? This condition may be diagnosed based on:  Your symptoms and medical history.  A physical exam.  A chest X-ray.  A CT scan.  Blood tests.  Sputum tests.  Removal of a sample of lung tissue (lung biopsy) for testing. Your cancer will be assessed (staged) to determine how severe it is and how much it has spread (metastasized). How is this treated? Treatment depends on the type and stage of your cancer. Treatment may include one or more of the following:  Surgery to remove as much of the cancer as possible. Lymph nodes in the area may be removed and tested for cancer as well.  Medicines that kill cancer cells (chemotherapy).  High-energy rays that kill cancer cells (radiation therapy).  Chemotherapy. This treatment uses medicines to destroy cancer cells.  Targeted therapy. This targets specific parts of cancer cells and the area around them to block the growth and spread of the cancer. Targeted therapy can help limit the damage to healthy cells. Follow these instructions at home: Eating and drinking  Some of your treatments might affect your appetite. If you are having problems eating, or if you do not have an appetite, meet with a dietitian.  If you have side effects that affect your appetite, it may help to: ? Eat smaller meals and snacks often. ? Drink high-nutrition and high-calorie shakes or supplements. ? Eat  bland and soft foods that are easy to eat. ? Avoid eating foods that are hot, spicy, or hard to swallow. General instructions   Do not use any products that contain nicotine or tobacco, such as cigarettes and e-cigarettes. If you need help quitting, ask your health care provider.  Do not drink alcohol.  If you are admitted to the hospital, make sure your cancer specialist  (oncologist) is aware. Your cancer may affect your treatment for other conditions.  Take over-the-counter and prescription medicines only as told by your health care provider.  Consider joining a support group for people who have been diagnosed with lung cancer.  Work with your health care provider to manage any side effects of treatment.  Keep all follow-up visits as told by your health care provider. This is important. Where to find more information  American Cancer Society: https://www.cancer.[place]): https://www.cancer.gov Contact a health care provider if you:  Lose weight without trying.  Have a persistent cough and wheezing.  Feel short of breath.  Get tired easily.  Have bone or joint pain.  Have difficulty swallowing.  Notice that your voice is changing or getting hoarse.  Have pain that does not get better with medicine. Get help right away if you:  Cough up blood.  Have new breathing problems.  Have chest pain.  Have a fever.  Have swelling in an ankle, leg, or arm, or the face or neck.  Have paralysis in your face.  Are very confused.  Have a drooping eyelid. Summary  Lung cancer is an abnormal growth of cancerous cells that forms a mass (malignant tumor) in a lung.  There are several types of lung cancer. The types are based on the appearance of the tumor cells. The two most common types are non-small cell and small cell.  The most common cause of lung cancer is smoking tobacco.  Early symptoms include a lasting cough, possibly with blood, and fatigue, unexplained weight loss, and shortness of breath.  After diagnosis, treatment depends on the type and stage of your cancer. This information is not intended to replace advice given to you by your health care provider. Make sure you discuss any questions you have with your health care provider. Document Revised: 06/26/2017 Document Reviewed: 05/21/2017 Elsevier  Patient Education  2020 [place].   Lung Resection  A lung resection is a procedure to remove part or all of a lung. An entire lung may be removed (pneumonectomy), or only part of it may be removed (lobectomy). A lung resection may be done as an open surgery or as a minimally invasive surgery. A lung resection is most often done to remove a tumor or cancer, but it may be done to treat other conditions. The procedure can relieve symptoms and keep the problem from getting worse. Tell a health care provider about:  Any allergies you have.  All medicines you are taking, including vitamins, herbs, eye drops, creams, and over-the-counter medicines.  Any problems you or family members have had with anesthetic medicines.  Any blood disorders you have.  Any surgeries you have had.  Any medical conditions you have.  Whether you are pregnant or may be pregnant. What are the risks? Generally, this is a safe procedure. However, problems may occur, including:  Excessive bleeding.  Infection.  Reaction to anesthesia.  Allergic reaction to medicines.  Blood clots.  Injury to a nerve or blood vessel.  Problems breathing.  Heart problems.  Stroke. What  happens before the procedure? Staying hydrated Follow instructions from your health care provider about hydration, which may include:  Up to 2 hours before the procedure - you may continue to drink clear liquids, such as water, clear fruit juice, black coffee, and plain tea. Eating and drinking restrictions Follow instructions from your health care provider about eating and drinking, which may include:  8 hours before the procedure - stop eating heavy meals or foods such as meat, fried foods, or fatty foods.  6 hours before the procedure - stop eating light meals or foods, such as toast or cereal.  6 hours before the procedure - stop drinking milk or drinks that contain milk.  2 hours before the procedure - stop drinking  clear liquids. Medicines Ask your health care provider about:  Changing or stopping your regular medicines. This is especially important if you are taking diabetes medicines or blood thinners.  Taking medicines such as aspirin and ibuprofen. These medicines can thin your blood. Do not take these medicines unless your health care provider tells you to take them.  Taking over-the-counter medicines, vitamins, herbs, and supplements. Tests You may have tests done before the procedure, including:  Blood and urine tests.  Imaging tests, such as X-rays, CT, MRI, or PET scans.  Bronchoscopy. In this procedure, a health care provider uses a flexible tube (bronchoscope) to look at the inside of your airways.  Pulmonary function tests. These are done to check how well your lungs work.  Heart testing. This is done to check heart function before the procedure.  Lymph node sampling. This may be done to see if you have a tumor that has spread. General instructions  Plan to have someone take you home from the hospital or clinic.  Plan to have a responsible adult care for you for at least 24 hours after you leave the hospital or clinic. This is important.  Do not use any products that contain nicotine or tobacco for as long as possible before your procedure. These include cigarettes and e-cigarettes. If you need help quitting, ask your health care provider.  Ask your health care provider what steps will be taken to help prevent infection. These may include: ? Removing hair at the surgery site. ? Washing skin with a germ-killing soap. ? Taking antibiotic medicine. What happens during the procedure?  An IV will be inserted into one of your veins. You will be given one or both of the following: ? A medicine to help you relax (sedative). ? A medicine to make you fall asleep (general anesthetic).  A breathing tube will be placed in your throat.  A thin tube (catheter) may be inserted into the  part of your body that drains urine from the bladder (urethra). The catheter will drain your urine.  Your health care provider will make a large incision on your side (open lung surgery) or several small incisions over your chest area (minimally invasive surgery).  Your health care provider will carefully cut any tissues leading to the area of the lung being treated.  The lung or part of the lung will then be removed. Lymph nodes near the lung may also be removed for testing.  Your health care provider will check inside your chest to make sure there is no bleeding in or around the lungs.  Your health care provider may put tubes into your chest to drain extra fluid and air after surgery.  Your incisions will be closed. This may be done using stitches (  sutures), staples, skin glue, or skin tape (adhesive) strips.  A bandage (dressing) may be placed over your incisions. The procedure may vary among health care providers and hospitals. What happens after the procedure?  Your blood pressure, heart rate, breathing rate, and blood oxygen level will be monitored until you leave the hospital or clinic.  Right after surgery, you may: ? Be moved to the intensive care unit (ICU). ? Continue to have a tube to help you breathe or have a urinary catheter. ? Have an IV for fluids and medicines. ? Remain on a respirator, if assistance is needed to help you breathe. ? Start respiratory therapy in the ICU. This will help your other lung to get strong and stay healthy. ? Be given medicine to help with pain and nausea.  You may have to wear compression stockings. These stockings help to prevent blood clots and reduce swelling in your legs.  As you continue to recover: ? You will be moved to a regular hospital room. Therapy will continue. ? You may be released to go home or to an extended care facility. Summary  A lung resection is a procedure to remove part or all of a lung. It can be done as an open  surgery or a minimally invasive surgery.  A lung resection is most often done to remove a tumor or cancer, but it may be done to treat other conditions.  After surgery, respiratory therapy will be prescribed to help your lung recover and become stronger. This information is not intended to replace advice given to you by your health care provider. Make sure you discuss any questions you have with your health care provider. Document Revised: 07/27/2017 Document Reviewed: 07/27/2017 Elsevier Patient Education  2020 [employer].

## 2019-10-25 ENCOUNTER — Encounter: Payer: Self-pay | Admitting: Thoracic Surgery (Cardiothoracic Vascular Surgery)

## 2019-10-25 ENCOUNTER — Other Ambulatory Visit: Payer: Self-pay

## 2019-10-25 ENCOUNTER — Institutional Professional Consult (permissible substitution): Payer: Medicare HMO | Admitting: Thoracic Surgery (Cardiothoracic Vascular Surgery)

## 2019-10-25 VITALS — BP 133/77 | HR 73 | Temp 97.7°F | Resp 16 | Ht 72.0 in | Wt 265.0 lb

## 2019-10-25 DIAGNOSIS — C3432 Malignant neoplasm of lower lobe, left bronchus or lung: Secondary | ICD-10-CM | POA: Diagnosis not present

## 2019-10-25 NOTE — Progress Notes (Signed)
PCP is McLean-Scocuzza, [name], MD Referring Provider is Nestor Lewandowsky, MD  Chief Complaint  Patient presents with  . Lung Cancer    LLLobe.Marland KitchenMarland KitchenSurgical eval,CT BX 10/17/19, PET Scan 09/07/19, Chest CT and MR ABD 08/30/19...had SPIROMETRY ONLY 06/08/18    HPI: Christopher Orr is sent for consultation regarding a left lower lobe squamous cell carcinoma  Christopher Orr is a 74 year old man with a history of tobacco abuse, COPD, bipolar disorder, early dementia, coronary artery disease, ethanol abuse, treated hepatitis C, gastroesophageal reflux, prostate cancer, hypertension, hyperlipidemia, mitral regurgitation, pancreatitis, posttraumatic stress disorder, and depression.  He smoked about a pack of cigarettes a day for 55 years prior to quitting a month ago.  He had a CT of the abdomen and pelvis in October 2019 which showed a questionable right middle lobe nodule.  A CT of the chest in February 2021 showed that nodule had resolved, but there was a 1 cm left lower lobe nodule that was new.  There also was some scarring in the right base but nothing that appeared suspicious.  He saw Dr. Genevive Bi in consultation.  He did further work-up including pulmonary function testing and a PET/CT.  PET/CT showed the lung nodule was hypermetabolic.  There also was uptake in the left parotid and the anal rectal region.  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  Colonoscopy showed only some benign polyps.  A lung biopsy was positive for squamous cell carcinoma.  Christopher Orr is a poor historian with some memory loss.  He does have some shortness of breath with exertion.  Activity is significantly limited by left leg pain.  He had a stent placed in 2006.  He is on Plavix for that and bruises easily.  He is not having any chest pain, pressure, or tightness with exertion.  He has had no change in appetite or weight loss.  He quit smoking a month ago.  Zubrod Score: At the time of surgery this patient's most appropriate  activity status/level should be described as: []     0    Normal activity, no symptoms [x]     1    Restricted in physical strenuous activity but ambulatory, able to do out light work []     2    Ambulatory and capable of self care, unable to do work activities, up and about >50 % of waking hours                              []     3    Only limited self care, in bed greater than 50% of waking hours []     4    Completely disabled, no self care, confined to bed or chair []     5    Moribund  Past Medical History:  Diagnosis Date  . Alcohol abuse    pt states it is marijuana  . Anemia   . Arthritis   . Bipolar disorder ([place])   . BPH (benign prostatic hypertrophy) with urinary obstruction   . CAD (coronary artery disease)    a. s/p stent to LAD in 2006 at [facility], EF 60% at that time.  . Cancer [facility])    prostate  . CHF (congestive heart failure) ([place])   . Chronic pancreatitis ([place])   . COPD (chronic obstructive pulmonary disease) ([place])   . Depression   . Diverticulosis   . Drug use   . Dyspnea   .  ED (erectile dysfunction)   . Gastritis   . GERD (gastroesophageal reflux disease)   . Headache    occasional migraines  . Hepatitis C    treated  . History of lumbar fusion   . Hyperlipidemia   . Hypertension    patient denies having high blood pressure  . Mitral regurgitation   . Pancreatitis    x 2   . Pneumonia    07/06/18   . PTSD (post-traumatic stress disorder)   . Rib fracture    s/p fall 03/15/19   . Sinus disease   . Urge incontinence     Past Surgical History:  Procedure Laterality Date  . cateract  2013  . COLONOSCOPY WITH PROPOFOL N/A 10/04/2019   Procedure: COLONOSCOPY WITH PROPOFOL;  Surgeon: Lucilla Lame, MD;  Location: [facility] ENDOSCOPY;  Service: Endoscopy;  Laterality: N/A;  . CORONARY STENT PLACEMENT  2006   x 1  . ESOPHAGOGASTRODUODENOSCOPY N/A 12/22/2014   Procedure: ESOPHAGOGASTRODUODENOSCOPY (EGD);  Surgeon: [name], MD;  Location: [facility] ENDOSCOPY;   Service: Endoscopy;  Laterality: N/A;  . ESOPHAGOGASTRODUODENOSCOPY (EGD) WITH PROPOFOL N/A 10/04/2019   Procedure: ESOPHAGOGASTRODUODENOSCOPY (EGD) WITH PROPOFOL;  Surgeon: Lucilla Lame, MD;  Location: [facility] ENDOSCOPY;  Service: Endoscopy;  Laterality: N/A;  . HERNIA REPAIR  2015   left groin  . LUMBAR FUSION    . RADIOACTIVE SEED IMPLANT N/A 04/22/2016   Procedure: RADIOACTIVE SEED IMPLANT/BRACHYTHERAPY IMPLANT;  Surgeon: Hollice Espy, MD;  Location: [facility] ORS;  Service: Urology;  Laterality: N/A;  . ROBOT ASSISTED INGUINAL HERNIA REPAIR Bilateral 07/06/2018   Procedure: ROBOT ASSISTED INGUINAL HERNIA REPAIR;  Surgeon: Jules Husbands, MD;  Location: [facility] ORS;  Service: General;  Laterality: Bilateral;  . TONSILLECTOMY    . UMBILICAL HERNIA REPAIR N/A 07/06/2018   Procedure: LAPAROSCOPIC ROBOT ASSISTED UMBILICAL HERNIA;  Surgeon: Jules Husbands, MD;  Location: [facility] ORS;  Service: General;  Laterality: N/A;    Family History  Problem Relation Age of Onset  . Heart disease Father        Unclear details. Father passed in late 68's 2/2 cancer.  . Colon cancer Father   . Alcohol abuse Sister   . Drug abuse Sister   . Anxiety disorder Sister   . Depression Sister   . Kidney disease Neg Hx   . Prostate cancer Neg Hx     Social History Social History   Tobacco Use  . Smoking status: Former Smoker    Types: Cigarettes    Start date: 07/28/1962    Quit date: 09/01/2019    Years since quitting: 0.1  . Smokeless tobacco: Never Used  Substance Use Topics  . Alcohol use: Yes    Alcohol/week: 6.0 standard drinks    Types: 6 Cans of beer per week  . Drug use: Yes    Frequency: 7.0 times per week    Types: Marijuana    Comment: Endorsed heroin 06/2014, UDS also + benzos, opiates, THC, neg for cocaine at that time.    Current Outpatient Medications  Medication Sig Dispense Refill  . albuterol (PROVENTIL) (2.5 MG/3ML) 0.083% nebulizer solution Take 3 mLs (2.5 mg total) by nebulization every  4 (four) hours as needed for wheezing or shortness of breath. 360 mL 3  . alprazolam (XANAX) 1 MG tablet Take 1 tablet (1 mg total) by mouth as directed. 1/2 in am 1 pill qhs    . ARIPiprazole (ABILIFY) 10 MG tablet Take 10 mg by mouth daily.    Marland Kitchen  aspirin 81 MG tablet Take 81 mg by mouth daily. Reported on 01/15/2016    . budesonide-formoterol (SYMBICORT) 160-4.5 MCG/ACT inhaler Inhale 2 puffs into the lungs 2 (two) times daily. Rinse mouth out 1 Inhaler 12  . celecoxib (CELEBREX) 200 MG capsule Take 1 capsule (200 mg total) by mouth daily after breakfast. 90 capsule 0  . clopidogrel (PLAVIX) 75 MG tablet Take 1 tablet (75 mg total) by mouth daily. Reported on 01/15/2016 90 tablet 3  . cyanocobalamin 1000 MCG tablet Take 1,000 mcg by mouth daily.    Marland Kitchen donepezil (ARICEPT) 5 MG tablet Take 1 tablet (5 mg total) by mouth at bedtime. 90 tablet 3  . FLUoxetine (PROZAC) 20 MG capsule Take 60 mg by mouth daily.  1  . folic acid (FOLVITE) 1 MG tablet TAKE 1 TABLET BY MOUTH EVERY DAY 90 tablet 0  . memantine (NAMENDA) 5 MG tablet Take 1 tablet (5 mg total) by mouth 2 (two) times daily. Taking qd 180 tablet 3  . metoprolol succinate (TOPROL-XL) 25 MG 24 hr tablet Take 1 tablet (25 mg total) by mouth daily. 90 tablet 3  . Multiple Vitamins-Minerals (MULTIVITAMIN WITH MINERALS) tablet Take 1 tablet by mouth daily.    Marland Kitchen MYRBETRIQ 50 MG TB24 tablet TAKE 1 TABLET BY MOUTH EVERY DAY 90 tablet 0  . omega-3 fish oil (MAXEPA) 1000 MG CAPS capsule Take 1 capsule by mouth daily.     . pantoprazole (PROTONIX) 40 MG tablet Take 1 tablet (40 mg total) by mouth daily. 30 min before breakfast 90 tablet 3  . rosuvastatin (CRESTOR) 10 MG tablet Take 1 tablet (10 mg total) by mouth at bedtime. 90 tablet 3  . tamsulosin (FLOMAX) 0.4 MG CAPS capsule Take 1 capsule (0.4 mg total) by mouth daily after supper. 90 capsule 3  . traZODone (DESYREL) 150 MG tablet Take 1 tablet (150 mg total) by mouth at bedtime. 90 tablet 3   No  current facility-administered medications for this visit.    No Known Allergies  Review of Systems  Constitutional: Negative for activity change and unexpected weight change.  HENT: Positive for dental problem (Dentures). Negative for trouble swallowing and voice change.   Respiratory: Positive for cough and shortness of breath (With exertion). Negative for wheezing.   Cardiovascular: Negative for chest pain and leg swelling.  Gastrointestinal: Negative for abdominal pain.  Musculoskeletal: Positive for arthralgias and joint swelling.  Neurological: Negative for seizures and syncope.       Memory loss  Hematological: Negative for adenopathy. Bruises/bleeds easily.  Psychiatric/Behavioral: Positive for dysphoric mood. The patient is nervous/anxious.   All other systems reviewed and are negative.   BP 133/77 (BP Location: Right Arm, Patient Position: Sitting, Cuff Size: Large)   Pulse 73   Temp 97.7 F (36.5 C)   Resp 16   Ht 6' (1.829 m)   Wt 265 lb (120.2 kg)   SpO2 94% Comment: RA  BMI 35.94 kg/m  Physical Exam Vitals reviewed.  Constitutional:      General: He is not in acute distress.    Appearance: He is obese.  HENT:     Head: Normocephalic and atraumatic.  Eyes:     General: No scleral icterus.    Extraocular Movements: Extraocular movements intact.  Neck:     Vascular: No carotid bruit.  Cardiovascular:     Rate and Rhythm: Normal rate and regular rhythm.     Heart sounds: Normal heart sounds. No murmur. No friction rub. No  gallop.   Pulmonary:     Effort: Pulmonary effort is normal. No respiratory distress.     Breath sounds: Normal breath sounds. No wheezing or rales.  Abdominal:     General: There is no distension.     Palpations: Abdomen is soft.     Tenderness: There is no abdominal tenderness.  Musculoskeletal:        General: No swelling.     Cervical back: Neck supple.  Lymphadenopathy:     Cervical: No cervical adenopathy.  Skin:    General:  Skin is warm and dry.  Neurological:     General: No focal deficit present.     Mental Status: He is alert and oriented to person, place, and time.     Cranial Nerves: No cranial nerve deficit.     Motor: No weakness.    Diagnostic Tests: CT CHEST WITHOUT CONTRAST  TECHNIQUE: Multidetector CT imaging of the chest was performed following the standard protocol without IV contrast.  COMPARISON:  05/25/2018 abdominopelvic CT.  FINDINGS: Cardiovascular: Aortic atherosclerosis. Tortuous thoracic aorta. Normal heart size, without pericardial effusion. Multivessel coronary artery atherosclerosis.  Mediastinum/Nodes: No mediastinal or definite hilar adenopathy, given limitations of unenhanced CT.  Lungs/Pleura: No pleural fluid. Moderate centrilobular and paraseptal emphysema.  The right middle lobe pulmonary nodule is no longer identified.  Right base opacities are progressive since 05/25/2018, likely atelectasis or developing scar  Since 04/22/2005, development of a pleural-based left lower lobe 1.0 cm nodule on 104/3.  Upper Abdomen: Normal imaged portions of the liver, spleen, stomach, pancreas, gallbladder, adrenal glands. Bilateral fluid density renal lesions are likely cysts. Abdominal aortic atherosclerosis.  Musculoskeletal: Nonacute bilateral rib fractures, including seventh and eighth incompletely healed lateral right rib fractures.  IMPRESSION: 1. The right middle lobe pulmonary nodule is no longer visualized and has likely resolved. 2. Since 04/22/2005, development of a 1.0 cm left lower lobe pulmonary nodule, for which primary bronchogenic carcinoma cannot be excluded. Consider multidisciplinary thoracic oncology consultation for eventual PET. 3. No thoracic adenopathy. 4. Aortic atherosclerosis (ICD10-I70.0), coronary artery atherosclerosis and emphysema (ICD10-J43.9).  These results will be called to the ordering clinician or representative by  the Radiologist Assistant, and communication documented in the PACS or zVision Dashboard.   Electronically Signed   By: Abigail Miyamoto M.D.   On: 08/30/2019 13:09 NUCLEAR MEDICINE PET SKULL BASE TO THIGH  TECHNIQUE: 13.96 mCi F-18 FDG was injected intravenously. Full-ring PET imaging was performed from the skull base to thigh after the radiotracer. CT data was obtained and used for attenuation correction and anatomic localization.  Fasting blood glucose: 106 mg/dl  COMPARISON:  Chest CT 08/30/2019, abdominal CT 05/25/2018 and abdominal MRI 08/30/2019.  FINDINGS: Mediastinal blood pool activity: SUV max 2.5  NECK:  No hypermetabolic cervical lymph nodes are identified.There are no lesions of the pharyngeal mucosal space. There is a hypermetabolic lesion within the superficial portion of the left parotid gland, measuring 1.7 x 2.0 cm on image 38/3. This has an SUV max of 6.2.  Incidental CT findings: Bilateral carotid atherosclerosis.  CHEST:  The subpleural nodule of concern posteriorly in the left lower lobe is mildly hypermetabolic (SUV max 2.7). This nodule measures 1.2 x 1.2 cm on image 117/3. No other hypermetabolic pulmonary activity or suspicious nodularity. There is low-level hilar activity bilaterally without discrete adenopathy. This has an SUV max of 4.6 on the left and 3.3 on the right. No hypermetabolic mediastinal or axillary adenopathy.  Incidental CT findings: Three-vessel coronary artery atherosclerosis  with lesser involvement of the aorta and great vessels. Mild to moderate centrilobular and paraseptal emphysema with further improvement in the right basilar aeration.  ABDOMEN/PELVIS:  There is no hypermetabolic activity within the liver, adrenal glands, spleen or pancreas. There is no hypermetabolic nodal activity. There is hypermetabolic activity associated with the anise (SUV max 8.7). No associated focal mass lesion or bowel  obstruction. No abnormal metabolic activity in the prostate gland post brachytherapy.  Incidental CT findings: Grossly stable renal cysts without hypermetabolic activity. Aortic and branch vessel atherosclerosis. Postsurgical changes in the left inguinal region.  SKELETON:  There is no hypermetabolic activity to suggest osseous metastatic disease.  Incidental CT findings: Previous L4-5 fusion with adjacent segment disease at L3-4.  IMPRESSION: 1. The left lower lobe pulmonary nodule is mildly hypermetabolic, suspicious for bronchogenic carcinoma or a solitary metastasis. No other suspicious pulmonary nodules. 2. No definite mediastinal adenopathy. Nonspecific low-level hilar activity bilaterally, possibly reactive. 3. Hypermetabolic left parotid mass, likely an incidental benign neoplasm such as Warthin's tumor. ENT evaluation recommended. 4. Indeterminate hypermetabolic activity at the anus, possibly physiologic. This appears unrelated to the prostate gland which appears normal post brachytherapy. Correlation with digital rectal exam recommended. 5. Coronary and Aortic Atherosclerosis (ICD10-I70.0). Emphysema (ICD10-J43.9).   Electronically Signed   By: Richardean Sale M.D.   On: 09/07/2019 11:23 I personally reviewed the CT and PET/CT images and concur with the findings noted above  Pulmonary function testing FVC 3.88 (83%) FEV1 2.43 (71%) FEV1 2.57 (75%) postbronchodilator DLCO 14.36 (53%) Findings consistent with moderate obstructive airways disease  Impression: Christopher Orr is a 74 year old man with a history of tobacco abuse, COPD, bipolar disorder, early dementia, coronary artery disease, ethanol abuse, treated hepatitis C, gastroesophageal reflux, prostate cancer, hypertension, hyperlipidemia, mitral regurgitation, pancreatitis, posttraumatic stress disorder, and depression.  He recently was found to have a 1 cm left lower lobe lung nodule.  This is a  relatively peripheral lesion.  The nodule was hypermetabolic on PET/CT with no evidence of metastatic disease.  Biopsy showed squamous cell carcinoma.  I discussed potential treatment options of surgery versus stereotactic radiation with Christopher Orr.  He understands the relative advantages and disadvantages of each of those approaches.  He prefers a surgical resection.  He has adequate pulmonary function to tolerate a lobectomy but I think we may be able to get away with a wide wedge resection versus a segmentectomy given his numerous comorbidities.  I discussed the proposed operation with Christopher Orr.  We would plan a robotic approach.  He understands the general nature of the procedure including the incisions to be used, the need for general anesthesia, the use of a drainage tube postoperatively, the expected hospital stay, and the overall recovery.  I informed him of the indications, risks, benefits, and alternatives.  He understands the risks include, but not limited to death, MI, DVT, PE, bleeding, possible need for transfusion, infection, prolonged air leak, cardiac arrhythmias, as well as the possibility of other unforeseeable complications.  He accepts the risk and wishes to proceed.  He would like [place] to call him in order to set up a date that is acceptable.  He is on Plavix and will need to hold that for 5 days prior to surgery.  He does have a history of coronary disease and had a stent placed in 2006.  He is not having any anginal symptoms at present.  Plan: Robotic left VATS for segmentectomy.   We will call the patient to schedule surgery.  Melrose Nakayama, MD [facility] [phone]

## 2019-10-25 NOTE — H&P (View-Only) (Signed)
PCP is McLean-Scocuzza, [name], MD Referring Provider is Nestor Lewandowsky, MD  Chief Complaint  Patient presents with  . Lung Cancer    LLLobe.Marland KitchenMarland KitchenSurgical eval,CT BX 10/17/19, PET Scan 09/07/19, Chest CT and MR ABD 08/30/19...had SPIROMETRY ONLY 06/08/18    HPI: Mr. Tabb is sent for consultation regarding a left lower lobe squamous cell carcinoma  Rico Massar is a 74 year old man with a history of tobacco abuse, COPD, bipolar disorder, early dementia, coronary artery disease, ethanol abuse, treated hepatitis C, gastroesophageal reflux, prostate cancer, hypertension, hyperlipidemia, mitral regurgitation, pancreatitis, posttraumatic stress disorder, and depression.  He smoked about a pack of cigarettes a day for 55 years prior to quitting a month ago.  He had a CT of the abdomen and pelvis in October 2019 which showed a questionable right middle lobe nodule.  A CT of the chest in February 2021 showed that nodule had resolved, but there was a 1 cm left lower lobe nodule that was new.  There also was some scarring in the right base but nothing that appeared suspicious.  He saw Dr. Genevive Bi in consultation.  He did further work-up including pulmonary function testing and a PET/CT.  PET/CT showed the lung nodule was hypermetabolic.  There also was uptake in the left parotid and the anal rectal region.  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  Colonoscopy showed only some benign polyps.  A lung biopsy was positive for squamous cell carcinoma.  Mr. Craney is a poor historian with some memory loss.  He does have some shortness of breath with exertion.  Activity is significantly limited by left leg pain.  He had a stent placed in 2006.  He is on Plavix for that and bruises easily.  He is not having any chest pain, pressure, or tightness with exertion.  He has had no change in appetite or weight loss.  He quit smoking a month ago.  Zubrod Score: At the time of surgery this patient's most appropriate  activity status/level should be described as: []     0    Normal activity, no symptoms [x]     1    Restricted in physical strenuous activity but ambulatory, able to do out light work []     2    Ambulatory and capable of self care, unable to do work activities, up and about >50 % of waking hours                              []     3    Only limited self care, in bed greater than 50% of waking hours []     4    Completely disabled, no self care, confined to bed or chair []     5    Moribund  Past Medical History:  Diagnosis Date  . Alcohol abuse    pt states it is marijuana  . Anemia   . Arthritis   . Bipolar disorder ([place])   . BPH (benign prostatic hypertrophy) with urinary obstruction   . CAD (coronary artery disease)    a. s/p stent to LAD in 2006 at [facility], EF 60% at that time.  . Cancer [facility])    prostate  . CHF (congestive heart failure) ([place])   . Chronic pancreatitis ([place])   . COPD (chronic obstructive pulmonary disease) ([place])   . Depression   . Diverticulosis   . Drug use   . Dyspnea   .  ED (erectile dysfunction)   . Gastritis   . GERD (gastroesophageal reflux disease)   . Headache    occasional migraines  . Hepatitis C    treated  . History of lumbar fusion   . Hyperlipidemia   . Hypertension    patient denies having high blood pressure  . Mitral regurgitation   . Pancreatitis    x 2   . Pneumonia    07/06/18   . PTSD (post-traumatic stress disorder)   . Rib fracture    s/p fall 03/15/19   . Sinus disease   . Urge incontinence     Past Surgical History:  Procedure Laterality Date  . cateract  2013  . COLONOSCOPY WITH PROPOFOL N/A 10/04/2019   Procedure: COLONOSCOPY WITH PROPOFOL;  Surgeon: Lucilla Lame, MD;  Location: [facility] ENDOSCOPY;  Service: Endoscopy;  Laterality: N/A;  . CORONARY STENT PLACEMENT  2006   x 1  . ESOPHAGOGASTRODUODENOSCOPY N/A 12/22/2014   Procedure: ESOPHAGOGASTRODUODENOSCOPY (EGD);  Surgeon: [name], MD;  Location[facility] ENDOSCOPY;   Service: Endoscopy;  Laterality: N/A;  . ESOPHAGOGASTRODUODENOSCOPY (EGD) WITH PROPOFOL N/A 10/04/2019   Procedure: ESOPHAGOGASTRODUODENOSCOPY (EGD) WITH PROPOFOL;  Surgeon: Lucilla Lame, MD;  Location: [facility] ENDOSCOPY;  Service: Endoscopy;  Laterality: N/A;  . HERNIA REPAIR  2015   left groin  . LUMBAR FUSION    . RADIOACTIVE SEED IMPLANT N/A 04/22/2016   Procedure: RADIOACTIVE SEED IMPLANT/BRACHYTHERAPY IMPLANT;  Surgeon: Hollice Espy, MD;  Location: [facility] ORS;  Service: Urology;  Laterality: N/A;  . ROBOT ASSISTED INGUINAL HERNIA REPAIR Bilateral 07/06/2018   Procedure: ROBOT ASSISTED INGUINAL HERNIA REPAIR;  Surgeon: Jules Husbands, MD;  Location: [facility] ORS;  Service: General;  Laterality: Bilateral;  . TONSILLECTOMY    . UMBILICAL HERNIA REPAIR N/A 07/06/2018   Procedure: LAPAROSCOPIC ROBOT ASSISTED UMBILICAL HERNIA;  Surgeon: Jules Husbands, MD;  Location: [facility] ORS;  Service: General;  Laterality: N/A;    Family History  Problem Relation Age of Onset  . Heart disease Father        Unclear details. Father passed in late 61's 2/2 cancer.  . Colon cancer Father   . Alcohol abuse Sister   . Drug abuse Sister   . Anxiety disorder Sister   . Depression Sister   . Kidney disease Neg Hx   . Prostate cancer Neg Hx     Social History Social History   Tobacco Use  . Smoking status: Former Smoker    Types: Cigarettes    Start date: 07/28/1962    Quit date: 09/01/2019    Years since quitting: 0.1  . Smokeless tobacco: Never Used  Substance Use Topics  . Alcohol use: Yes    Alcohol/week: 6.0 standard drinks    Types: 6 Cans of beer per week  . Drug use: Yes    Frequency: 7.0 times per week    Types: Marijuana    Comment: Endorsed heroin 06/2014, UDS also + benzos, opiates, THC, neg for cocaine at that time.    Current Outpatient Medications  Medication Sig Dispense Refill  . albuterol (PROVENTIL) (2.5 MG/3ML) 0.083% nebulizer solution Take 3 mLs (2.5 mg total) by nebulization every  4 (four) hours as needed for wheezing or shortness of breath. 360 mL 3  . alprazolam (XANAX) 1 MG tablet Take 1 tablet (1 mg total) by mouth as directed. 1/2 in am 1 pill qhs    . ARIPiprazole (ABILIFY) 10 MG tablet Take 10 mg by mouth daily.    Marland Kitchen  aspirin 81 MG tablet Take 81 mg by mouth daily. Reported on 01/15/2016    . budesonide-formoterol (SYMBICORT) 160-4.5 MCG/ACT inhaler Inhale 2 puffs into the lungs 2 (two) times daily. Rinse mouth out 1 Inhaler 12  . celecoxib (CELEBREX) 200 MG capsule Take 1 capsule (200 mg total) by mouth daily after breakfast. 90 capsule 0  . clopidogrel (PLAVIX) 75 MG tablet Take 1 tablet (75 mg total) by mouth daily. Reported on 01/15/2016 90 tablet 3  . cyanocobalamin 1000 MCG tablet Take 1,000 mcg by mouth daily.    Marland Kitchen donepezil (ARICEPT) 5 MG tablet Take 1 tablet (5 mg total) by mouth at bedtime. 90 tablet 3  . FLUoxetine (PROZAC) 20 MG capsule Take 60 mg by mouth daily.  1  . folic acid (FOLVITE) 1 MG tablet TAKE 1 TABLET BY MOUTH EVERY DAY 90 tablet 0  . memantine (NAMENDA) 5 MG tablet Take 1 tablet (5 mg total) by mouth 2 (two) times daily. Taking qd 180 tablet 3  . metoprolol succinate (TOPROL-XL) 25 MG 24 hr tablet Take 1 tablet (25 mg total) by mouth daily. 90 tablet 3  . Multiple Vitamins-Minerals (MULTIVITAMIN WITH MINERALS) tablet Take 1 tablet by mouth daily.    Marland Kitchen MYRBETRIQ 50 MG TB24 tablet TAKE 1 TABLET BY MOUTH EVERY DAY 90 tablet 0  . omega-3 fish oil (MAXEPA) 1000 MG CAPS capsule Take 1 capsule by mouth daily.     . pantoprazole (PROTONIX) 40 MG tablet Take 1 tablet (40 mg total) by mouth daily. 30 min before breakfast 90 tablet 3  . rosuvastatin (CRESTOR) 10 MG tablet Take 1 tablet (10 mg total) by mouth at bedtime. 90 tablet 3  . tamsulosin (FLOMAX) 0.4 MG CAPS capsule Take 1 capsule (0.4 mg total) by mouth daily after supper. 90 capsule 3  . traZODone (DESYREL) 150 MG tablet Take 1 tablet (150 mg total) by mouth at bedtime. 90 tablet 3   No  current facility-administered medications for this visit.    No Known Allergies  Review of Systems  Constitutional: Negative for activity change and unexpected weight change.  HENT: Positive for dental problem (Dentures). Negative for trouble swallowing and voice change.   Respiratory: Positive for cough and shortness of breath (With exertion). Negative for wheezing.   Cardiovascular: Negative for chest pain and leg swelling.  Gastrointestinal: Negative for abdominal pain.  Musculoskeletal: Positive for arthralgias and joint swelling.  Neurological: Negative for seizures and syncope.       Memory loss  Hematological: Negative for adenopathy. Bruises/bleeds easily.  Psychiatric/Behavioral: Positive for dysphoric mood. The patient is nervous/anxious.   All other systems reviewed and are negative.   BP 133/77 (BP Location: Right Arm, Patient Position: Sitting, Cuff Size: Large)   Pulse 73   Temp 97.7 F (36.5 C)   Resp 16   Ht 6' (1.829 m)   Wt 265 lb (120.2 kg)   SpO2 94% Comment: RA  BMI 35.94 kg/m  Physical Exam Vitals reviewed.  Constitutional:      General: He is not in acute distress.    Appearance: He is obese.  HENT:     Head: Normocephalic and atraumatic.  Eyes:     General: No scleral icterus.    Extraocular Movements: Extraocular movements intact.  Neck:     Vascular: No carotid bruit.  Cardiovascular:     Rate and Rhythm: Normal rate and regular rhythm.     Heart sounds: Normal heart sounds. No murmur. No friction rub. No  gallop.   Pulmonary:     Effort: Pulmonary effort is normal. No respiratory distress.     Breath sounds: Normal breath sounds. No wheezing or rales.  Abdominal:     General: There is no distension.     Palpations: Abdomen is soft.     Tenderness: There is no abdominal tenderness.  Musculoskeletal:        General: No swelling.     Cervical back: Neck supple.  Lymphadenopathy:     Cervical: No cervical adenopathy.  Skin:    General:  Skin is warm and dry.  Neurological:     General: No focal deficit present.     Mental Status: He is alert and oriented to person, place, and time.     Cranial Nerves: No cranial nerve deficit.     Motor: No weakness.    Diagnostic Tests: CT CHEST WITHOUT CONTRAST  TECHNIQUE: Multidetector CT imaging of the chest was performed following the standard protocol without IV contrast.  COMPARISON:  05/25/2018 abdominopelvic CT.  FINDINGS: Cardiovascular: Aortic atherosclerosis. Tortuous thoracic aorta. Normal heart size, without pericardial effusion. Multivessel coronary artery atherosclerosis.  Mediastinum/Nodes: No mediastinal or definite hilar adenopathy, given limitations of unenhanced CT.  Lungs/Pleura: No pleural fluid. Moderate centrilobular and paraseptal emphysema.  The right middle lobe pulmonary nodule is no longer identified.  Right base opacities are progressive since 05/25/2018, likely atelectasis or developing scar  Since 04/22/2005, development of a pleural-based left lower lobe 1.0 cm nodule on 104/3.  Upper Abdomen: Normal imaged portions of the liver, spleen, stomach, pancreas, gallbladder, adrenal glands. Bilateral fluid density renal lesions are likely cysts. Abdominal aortic atherosclerosis.  Musculoskeletal: Nonacute bilateral rib fractures, including seventh and eighth incompletely healed lateral right rib fractures.  IMPRESSION: 1. The right middle lobe pulmonary nodule is no longer visualized and has likely resolved. 2. Since 04/22/2005, development of a 1.0 cm left lower lobe pulmonary nodule, for which primary bronchogenic carcinoma cannot be excluded. Consider multidisciplinary thoracic oncology consultation for eventual PET. 3. No thoracic adenopathy. 4. Aortic atherosclerosis (ICD10-I70.0), coronary artery atherosclerosis and emphysema (ICD10-J43.9).  These results will be called to the ordering clinician or representative by  the Radiologist Assistant, and communication documented in the PACS or zVision Dashboard.   Electronically Signed   By: Abigail Miyamoto M.D.   On: 08/30/2019 13:09 NUCLEAR MEDICINE PET SKULL BASE TO THIGH  TECHNIQUE: 13.96 mCi F-18 FDG was injected intravenously. Full-ring PET imaging was performed from the skull base to thigh after the radiotracer. CT data was obtained and used for attenuation correction and anatomic localization.  Fasting blood glucose: 106 mg/dl  COMPARISON:  Chest CT 08/30/2019, abdominal CT 05/25/2018 and abdominal MRI 08/30/2019.  FINDINGS: Mediastinal blood pool activity: SUV max 2.5  NECK:  No hypermetabolic cervical lymph nodes are identified.There are no lesions of the pharyngeal mucosal space. There is a hypermetabolic lesion within the superficial portion of the left parotid gland, measuring 1.7 x 2.0 cm on image 38/3. This has an SUV max of 6.2.  Incidental CT findings: Bilateral carotid atherosclerosis.  CHEST:  The subpleural nodule of concern posteriorly in the left lower lobe is mildly hypermetabolic (SUV max 2.7). This nodule measures 1.2 x 1.2 cm on image 117/3. No other hypermetabolic pulmonary activity or suspicious nodularity. There is low-level hilar activity bilaterally without discrete adenopathy. This has an SUV max of 4.6 on the left and 3.3 on the right. No hypermetabolic mediastinal or axillary adenopathy.  Incidental CT findings: Three-vessel coronary artery atherosclerosis  with lesser involvement of the aorta and great vessels. Mild to moderate centrilobular and paraseptal emphysema with further improvement in the right basilar aeration.  ABDOMEN/PELVIS:  There is no hypermetabolic activity within the liver, adrenal glands, spleen or pancreas. There is no hypermetabolic nodal activity. There is hypermetabolic activity associated with the anise (SUV max 8.7). No associated focal mass lesion or bowel  obstruction. No abnormal metabolic activity in the prostate gland post brachytherapy.  Incidental CT findings: Grossly stable renal cysts without hypermetabolic activity. Aortic and branch vessel atherosclerosis. Postsurgical changes in the left inguinal region.  SKELETON:  There is no hypermetabolic activity to suggest osseous metastatic disease.  Incidental CT findings: Previous L4-5 fusion with adjacent segment disease at L3-4.  IMPRESSION: 1. The left lower lobe pulmonary nodule is mildly hypermetabolic, suspicious for bronchogenic carcinoma or a solitary metastasis. No other suspicious pulmonary nodules. 2. No definite mediastinal adenopathy. Nonspecific low-level hilar activity bilaterally, possibly reactive. 3. Hypermetabolic left parotid mass, likely an incidental benign neoplasm such as Warthin's tumor. ENT evaluation recommended. 4. Indeterminate hypermetabolic activity at the anus, possibly physiologic. This appears unrelated to the prostate gland which appears normal post brachytherapy. Correlation with digital rectal exam recommended. 5. Coronary and Aortic Atherosclerosis (ICD10-I70.0). Emphysema (ICD10-J43.9).   Electronically Signed   By: Richardean Sale M.D.   On: 09/07/2019 11:23 I personally reviewed the CT and PET/CT images and concur with the findings noted above  Pulmonary function testing FVC 3.88 (83%) FEV1 2.43 (71%) FEV1 2.57 (75%) postbronchodilator DLCO 14.36 (53%) Findings consistent with moderate obstructive airways disease  Impression: Christopher Orr is a 74 year old man with a history of tobacco abuse, COPD, bipolar disorder, early dementia, coronary artery disease, ethanol abuse, treated hepatitis C, gastroesophageal reflux, prostate cancer, hypertension, hyperlipidemia, mitral regurgitation, pancreatitis, posttraumatic stress disorder, and depression.  He recently was found to have a 1 cm left lower lobe lung nodule.  This is a  relatively peripheral lesion.  The nodule was hypermetabolic on PET/CT with no evidence of metastatic disease.  Biopsy showed squamous cell carcinoma.  I discussed potential treatment options of surgery versus stereotactic radiation with Mr. Moris.  He understands the relative advantages and disadvantages of each of those approaches.  He prefers a surgical resection.  He has adequate pulmonary function to tolerate a lobectomy but I think we may be able to get away with a wide wedge resection versus a segmentectomy given his numerous comorbidities.  I discussed the proposed operation with Mr. Fambro.  We would plan a robotic approach.  He understands the general nature of the procedure including the incisions to be used, the need for general anesthesia, the use of a drainage tube postoperatively, the expected hospital stay, and the overall recovery.  I informed him of the indications, risks, benefits, and alternatives.  He understands the risks include, but not limited to death, MI, DVT, PE, bleeding, possible need for transfusion, infection, prolonged air leak, cardiac arrhythmias, as well as the possibility of other unforeseeable complications.  He accepts the risk and wishes to proceed.  He would like [place] to call him in order to set up a date that is acceptable.  He is on Plavix and will need to hold that for 5 days prior to surgery.  He does have a history of coronary disease and had a stent placed in 2006.  He is not having any anginal symptoms at present.  Plan: Robotic left VATS for segmentectomy.   We will call the patient to schedule surgery.  Melrose Nakayama, MD [facility] [phone]

## 2019-10-31 ENCOUNTER — Other Ambulatory Visit: Payer: Self-pay

## 2019-10-31 DIAGNOSIS — C3432 Malignant neoplasm of lower lobe, left bronchus or lung: Secondary | ICD-10-CM

## 2019-11-01 ENCOUNTER — Encounter: Payer: Self-pay | Admitting: *Deleted

## 2019-11-01 DIAGNOSIS — C3490 Malignant neoplasm of unspecified part of unspecified bronchus or lung: Secondary | ICD-10-CM | POA: Insufficient documentation

## 2019-11-01 NOTE — Progress Notes (Signed)
  Oncology Nurse Navigator Documentation  Navigator Location: [facility] (11/01/19 1000)   )Navigator Encounter Type: Telephone (11/01/19 1000) Telephone: Appt Confirmation/Clarification (11/01/19 1000)   Confirmed Diagnosis Date: 10/18/19 (11/01/19 1000) Expected Surgery Date: 11/09/19 (11/01/19 1000)                 Barriers/Navigation Needs: Coordination of Care (11/01/19 1000)   Interventions: Coordination of Care (11/01/19 1000)   Coordination of Care: Appts (11/01/19 1000)           phone call made to patient to schedule for follow up visit with Dr. Janese Banks after surgery. Surgery scheduled for 11/09/19. Pt scheduled to follow up with Dr. Janese Banks on 11/22/19 at 2:30pm. Pt prefers afternoon appts and requested that appt be mailed to him. Nothing further needed at this time.       Time Spent with Patient: 30 (11/01/19 1000)

## 2019-11-02 ENCOUNTER — Ambulatory Visit: Payer: Medicare HMO

## 2019-11-03 ENCOUNTER — Other Ambulatory Visit: Payer: Self-pay

## 2019-11-03 DIAGNOSIS — C61 Malignant neoplasm of prostate: Secondary | ICD-10-CM

## 2019-11-04 ENCOUNTER — Other Ambulatory Visit: Payer: Medicare HMO

## 2019-11-04 ENCOUNTER — Encounter: Payer: Self-pay | Admitting: Urology

## 2019-11-04 ENCOUNTER — Other Ambulatory Visit: Payer: Self-pay

## 2019-11-04 DIAGNOSIS — C61 Malignant neoplasm of prostate: Secondary | ICD-10-CM

## 2019-11-05 LAB — PSA: Prostate Specific Ag, Serum: 0.2 ng/mL (ref 0.0–4.0)

## 2019-11-07 ENCOUNTER — Other Ambulatory Visit (HOSPITAL_COMMUNITY)
Admission: RE | Admit: 2019-11-07 | Discharge: 2019-11-07 | Disposition: A | Discharge status: Home / Self Care | Payer: Medicare HMO | Source: Ambulatory Visit | Attending: Thoracic Surgery (Cardiothoracic Vascular Surgery) | Admitting: Thoracic Surgery (Cardiothoracic Vascular Surgery)

## 2019-11-07 ENCOUNTER — Ambulatory Visit (HOSPITAL_COMMUNITY)
Admission: RE | Admit: 2019-11-07 | Discharge: 2019-11-07 | Disposition: A | Discharge status: Home / Self Care | Payer: Medicare HMO | Source: Ambulatory Visit | Attending: Thoracic Surgery (Cardiothoracic Vascular Surgery) | Admitting: Thoracic Surgery (Cardiothoracic Vascular Surgery)

## 2019-11-07 ENCOUNTER — Encounter (HOSPITAL_COMMUNITY)
Admission: RE | Admit: 2019-11-07 | Discharge: 2019-11-07 | Disposition: A | Discharge status: Home / Self Care | Payer: Medicare HMO | Source: Ambulatory Visit | Attending: Thoracic Surgery (Cardiothoracic Vascular Surgery) | Admitting: Thoracic Surgery (Cardiothoracic Vascular Surgery)

## 2019-11-07 ENCOUNTER — Encounter (HOSPITAL_COMMUNITY): Payer: Self-pay

## 2019-11-07 ENCOUNTER — Other Ambulatory Visit: Payer: Self-pay

## 2019-11-07 DIAGNOSIS — Z4682 Encounter for fitting and adjustment of non-vascular catheter: Secondary | ICD-10-CM | POA: Diagnosis not present

## 2019-11-07 DIAGNOSIS — J449 Chronic obstructive pulmonary disease, unspecified: Secondary | ICD-10-CM | POA: Diagnosis not present

## 2019-11-07 DIAGNOSIS — Z8249 Family history of ischemic heart disease and other diseases of the circulatory system: Secondary | ICD-10-CM | POA: Diagnosis not present

## 2019-11-07 DIAGNOSIS — J181 Lobar pneumonia, unspecified organism: Secondary | ICD-10-CM | POA: Diagnosis not present

## 2019-11-07 DIAGNOSIS — F4310 Post-traumatic stress disorder, unspecified: Secondary | ICD-10-CM | POA: Diagnosis present

## 2019-11-07 DIAGNOSIS — C3432 Malignant neoplasm of lower lobe, left bronchus or lung: Secondary | ICD-10-CM | POA: Insufficient documentation

## 2019-11-07 DIAGNOSIS — F319 Bipolar disorder, unspecified: Secondary | ICD-10-CM | POA: Diagnosis not present

## 2019-11-07 DIAGNOSIS — I25119 Atherosclerotic heart disease of native coronary artery with unspecified angina pectoris: Secondary | ICD-10-CM | POA: Diagnosis not present

## 2019-11-07 DIAGNOSIS — Z8619 Personal history of other infectious and parasitic diseases: Secondary | ICD-10-CM | POA: Diagnosis not present

## 2019-11-07 DIAGNOSIS — Z800 Family history of malignant neoplasm of digestive organs: Secondary | ICD-10-CM | POA: Diagnosis not present

## 2019-11-07 DIAGNOSIS — E785 Hyperlipidemia, unspecified: Secondary | ICD-10-CM | POA: Diagnosis not present

## 2019-11-07 DIAGNOSIS — I2510 Atherosclerotic heart disease of native coronary artery without angina pectoris: Secondary | ICD-10-CM | POA: Diagnosis not present

## 2019-11-07 DIAGNOSIS — F0390 Unspecified dementia without behavioral disturbance: Secondary | ICD-10-CM | POA: Diagnosis not present

## 2019-11-07 DIAGNOSIS — Z955 Presence of coronary angioplasty implant and graft: Secondary | ICD-10-CM | POA: Diagnosis not present

## 2019-11-07 DIAGNOSIS — D62 Acute posthemorrhagic anemia: Secondary | ICD-10-CM | POA: Diagnosis not present

## 2019-11-07 DIAGNOSIS — R3129 Other microscopic hematuria: Secondary | ICD-10-CM | POA: Diagnosis not present

## 2019-11-07 DIAGNOSIS — Z8546 Personal history of malignant neoplasm of prostate: Secondary | ICD-10-CM | POA: Diagnosis not present

## 2019-11-07 DIAGNOSIS — N289 Disorder of kidney and ureter, unspecified: Secondary | ICD-10-CM | POA: Diagnosis not present

## 2019-11-07 DIAGNOSIS — R0602 Shortness of breath: Secondary | ICD-10-CM | POA: Diagnosis not present

## 2019-11-07 DIAGNOSIS — M1990 Unspecified osteoarthritis, unspecified site: Secondary | ICD-10-CM | POA: Diagnosis present

## 2019-11-07 DIAGNOSIS — Z20822 Contact with and (suspected) exposure to covid-19: Secondary | ICD-10-CM | POA: Insufficient documentation

## 2019-11-07 DIAGNOSIS — Z981 Arthrodesis status: Secondary | ICD-10-CM | POA: Diagnosis not present

## 2019-11-07 DIAGNOSIS — Z87891 Personal history of nicotine dependence: Secondary | ICD-10-CM | POA: Diagnosis not present

## 2019-11-07 DIAGNOSIS — C3490 Malignant neoplasm of unspecified part of unspecified bronchus or lung: Secondary | ICD-10-CM | POA: Diagnosis not present

## 2019-11-07 DIAGNOSIS — M79605 Pain in left leg: Secondary | ICD-10-CM | POA: Diagnosis present

## 2019-11-07 DIAGNOSIS — G4700 Insomnia, unspecified: Secondary | ICD-10-CM | POA: Diagnosis not present

## 2019-11-07 DIAGNOSIS — Z7902 Long term (current) use of antithrombotics/antiplatelets: Secondary | ICD-10-CM | POA: Diagnosis not present

## 2019-11-07 DIAGNOSIS — I340 Nonrheumatic mitral (valve) insufficiency: Secondary | ICD-10-CM | POA: Diagnosis present

## 2019-11-07 DIAGNOSIS — K219 Gastro-esophageal reflux disease without esophagitis: Secondary | ICD-10-CM | POA: Diagnosis present

## 2019-11-07 DIAGNOSIS — I110 Hypertensive heart disease with heart failure: Secondary | ICD-10-CM | POA: Diagnosis present

## 2019-11-07 DIAGNOSIS — N3941 Urge incontinence: Secondary | ICD-10-CM | POA: Diagnosis not present

## 2019-11-07 DIAGNOSIS — R7303 Prediabetes: Secondary | ICD-10-CM | POA: Diagnosis not present

## 2019-11-07 DIAGNOSIS — Z8719 Personal history of other diseases of the digestive system: Secondary | ICD-10-CM | POA: Diagnosis not present

## 2019-11-07 DIAGNOSIS — Z811 Family history of alcohol abuse and dependence: Secondary | ICD-10-CM | POA: Diagnosis not present

## 2019-11-07 DIAGNOSIS — C61 Malignant neoplasm of prostate: Secondary | ICD-10-CM | POA: Diagnosis not present

## 2019-11-07 DIAGNOSIS — J9811 Atelectasis: Secondary | ICD-10-CM | POA: Diagnosis not present

## 2019-11-07 HISTORY — DX: Unspecified cirrhosis of liver: K74.60

## 2019-11-07 HISTORY — DX: Unspecified dementia, unspecified severity, without behavioral disturbance, psychotic disturbance, mood disturbance, and anxiety: F03.90

## 2019-11-07 LAB — COMPREHENSIVE METABOLIC PANEL
ALT: 18 U/L (ref 0–44)
AST: 20 U/L (ref 15–41)
Albumin: 3.8 g/dL (ref 3.5–5.0)
Alkaline Phosphatase: 79 U/L (ref 38–126)
Anion gap: 11 (ref 5–15)
BUN: 16 mg/dL (ref 8–23)
CO2: 20 mmol/L — ABNORMAL LOW (ref 22–32)
Calcium: 8.6 mg/dL — ABNORMAL LOW (ref 8.9–10.3)
Chloride: 108 mmol/L (ref 98–111)
Creatinine, Ser: 1.17 mg/dL (ref 0.61–1.24)
GFR calc Af Amer: >60 mL/min (ref >60)
GFR calc non Af Amer: >60 mL/min (ref >60)
Glucose, Bld: 100 mg/dL — ABNORMAL HIGH (ref 70–99)
Potassium: 4.6 mmol/L (ref 3.5–5.1)
Sodium: 139 mmol/L (ref 135–145)
Total Bilirubin: 0.5 mg/dL (ref 0.3–1.2)
Total Protein: 6.9 g/dL (ref 6.5–8.1)

## 2019-11-07 LAB — BLOOD GAS, ARTERIAL
Acid-base deficit: 0.5 mmol/L (ref 0.0–2.0)
Bicarbonate: 24 mmol/L (ref 20.0–28.0)
Drawn by: 421801
FIO2: 21
O2 Saturation: 95.1 %
Patient temperature: 37
pCO2 arterial: 41.8 mmHg (ref 32.0–48.0)
pH, Arterial: 7.377 (ref 7.350–7.450)
pO2, Arterial: 87.1 mmHg (ref 83.0–108.0)

## 2019-11-07 LAB — SARS CORONAVIRUS 2 (TAT 6-24 HRS): SARS Coronavirus 2: NEGATIVE

## 2019-11-07 LAB — URINALYSIS, ROUTINE W REFLEX MICROSCOPIC
Bilirubin Urine: NEGATIVE
Glucose, UA: NEGATIVE mg/dL
Ketones, ur: NEGATIVE mg/dL
Leukocytes,Ua: NEGATIVE
Nitrite: NEGATIVE
Protein, ur: NEGATIVE mg/dL
Specific Gravity, Urine: 1.017 (ref 1.005–1.030)
pH: 6 (ref 5.0–8.0)

## 2019-11-07 LAB — SURGICAL PCR SCREEN
MRSA, PCR: NEGATIVE
Staphylococcus aureus: NEGATIVE

## 2019-11-07 LAB — CBC
HCT: 39.5 % (ref 39.0–52.0)
Hemoglobin: 12.8 g/dL — ABNORMAL LOW (ref 13.0–17.0)
MCH: 32.8 pg (ref 26.0–34.0)
MCHC: 32.4 g/dL (ref 30.0–36.0)
MCV: 101.3 fL — ABNORMAL HIGH (ref 80.0–100.0)
Platelets: 230 10*3/uL (ref 150–400)
RBC: 3.9 MIL/uL — ABNORMAL LOW (ref 4.22–5.81)
RDW: 14.4 % (ref 11.5–15.5)
WBC: 6.5 10*3/uL (ref 4.0–10.5)
nRBC: 0 % (ref 0.0–0.2)

## 2019-11-07 LAB — TYPE AND SCREEN
ABO/RH(D): O POS
Antibody Screen: NEGATIVE

## 2019-11-07 LAB — PROTIME-INR
INR: 1 (ref 0.8–1.2)
Prothrombin Time: 12.9 seconds (ref 11.4–15.2)

## 2019-11-07 LAB — APTT: aPTT: 29 seconds (ref 24–36)

## 2019-11-07 LAB — ABO/RH: ABO/RH(D): O POS

## 2019-11-07 NOTE — Progress Notes (Addendum)
11/08/19 11:54 AM   Christopher Orr [birth year] [id number]  Referring provider: Jodelle Green, FNP No address on file  Chief Complaint  Patient presents with  . Prostate Cancer    HPI: Christopher Orr is a 74 y.o. M with a hx of prostate cancer and multiple GU issues returns today for a 6 month f/u.   He is scheduled for lung surgery tomorrow.   History of prostate cancer He was dx with low risk Gleason 3+3 prostate cancer s/p brachytherapy seed implant 04/22/16 with I-125 seeds.   He was dx after rise in his PSA up to 3.8 on 12/20/15 up from 3.2 on 09/24/15, and 2.2 the previous year s/p prostate biopsy on 01/15/16 demonstrating prostate cancer, T1c, Gleason 3+3 involving 7/12 cores including all cores on left and single core at right apex up to 70% tissue. TRUS volume 42 cc.  Most recent PSA0.2 on 11/04/2019.   BPH with LUTS/ urinary frequency/ nocturia He does also have baseline lower urinary tract symptoms including urinary urgency and urge incontinence. He is currently on Mybetriq 50 mg and flomax 0.4 mg.   Today, he complains of worsening night time voiding symptoms including incontinence.  He recently started drinking more whiskey and less beer but continues to drink in excess. He has no daytime symptoms. He has not withdrawn from EtOH in past.   No gross hematuria or dysuria.  No UTIs.  PVR 4 mL.   ED Baseline severe ED, unable to achieve or maintain an erection.Not discussed today.  Renal cyst Contacted by PCP, recently underwent repeat imaging in the form of MRI of the abdomen for further evaluation/follow-up of a renal cyst seen on previous imaging.  MRI revealed a 15 mm complex cystic lesion with hemorrhagic mural nodule in the anterior right lower pole of the kidney.  This previously measured 2.3 cm.  He has additional Bosniak 1 and 2 cyst as well.  No flank pain.  PMH: Past Medical History:  Diagnosis Date  . Alcohol abuse    pt states it is marijuana   . Anemia   . Arthritis   . Bipolar disorder ([place])   . BPH (benign prostatic hypertrophy) with urinary obstruction   . CAD (coronary artery disease)    a. s/p stent to LAD in 2006 at [facility], EF 60% at that time.  . Cancer [facility])    prostate  . CHF (congestive heart failure) ([place])   . Chronic pancreatitis ([place])   . Cirrhosis ([place])   . COPD (chronic obstructive pulmonary disease) ([place])   . Dementia ([place])    early dementia  . Depression   . Diverticulosis   . Drug use   . Dyspnea   . ED (erectile dysfunction)   . Gastritis   . GERD (gastroesophageal reflux disease)   . Headache    occasional migraines  . Hepatitis C    treated  . History of lumbar fusion   . Hyperlipidemia   . Hypertension    patient denies having high blood pressure  . Mitral regurgitation   . Pancreatitis    x 2   . Pneumonia    07/06/18   . PTSD (post-traumatic stress disorder)   . Rib fracture    s/p fall 03/15/19   . Sinus disease   . Urge incontinence     Surgical History: Past Surgical History:  Procedure Laterality Date  . CARDIAC CATHETERIZATION  2006  . cateract  2013  . COLONOSCOPY WITH PROPOFOL  N/A 10/04/2019   Procedure: COLONOSCOPY WITH PROPOFOL;  Surgeon: Lucilla Lame, MD;  Location: [facility] ENDOSCOPY;  Service: Endoscopy;  Laterality: N/A;  . CORONARY ANGIOPLASTY  2006  . CORONARY STENT PLACEMENT  2006   x 1  . ESOPHAGOGASTRODUODENOSCOPY N/A 12/22/2014   Procedure: ESOPHAGOGASTRODUODENOSCOPY (EGD);  Surgeon: [name], MD;  Location: [facility] ENDOSCOPY;  Service: Endoscopy;  Laterality: N/A;  . ESOPHAGOGASTRODUODENOSCOPY (EGD) WITH PROPOFOL N/A 10/04/2019   Procedure: ESOPHAGOGASTRODUODENOSCOPY (EGD) WITH PROPOFOL;  Surgeon: Lucilla Lame, MD;  Location: [facility] ENDOSCOPY;  Service: Endoscopy;  Laterality: N/A;  . HERNIA REPAIR  2015   left groin  . INTERCOSTAL NERVE BLOCK Left 11/09/2019   Procedure: Intercostal Nerve Block;  Surgeon: Melrose Nakayama, MD;  Location: [place];  Service:  Thoracic;  Laterality: Left;  . LUMBAR FUSION    . RADIOACTIVE SEED IMPLANT N/A 04/22/2016   Procedure: RADIOACTIVE SEED IMPLANT/BRACHYTHERAPY IMPLANT;  Surgeon: Hollice Espy, MD;  Location: [facility] ORS;  Service: Urology;  Laterality: N/A;  . ROBOT ASSISTED INGUINAL HERNIA REPAIR Bilateral 07/06/2018   Procedure: ROBOT ASSISTED INGUINAL HERNIA REPAIR;  Surgeon: Jules Husbands, MD;  Location: [facility] ORS;  Service: General;  Laterality: Bilateral;  . TONSILLECTOMY    . UMBILICAL HERNIA REPAIR N/A 07/06/2018   Procedure: LAPAROSCOPIC ROBOT ASSISTED UMBILICAL HERNIA;  Surgeon: Jules Husbands, MD;  Location: [facility] ORS;  Service: General;  Laterality: N/A;  . XI ROBOTIC ASSISTED THORACOSCOPY- SEGMENTECTOMY Left 11/09/2019   Procedure: XI ROBOTIC ASSISTED THORACOSCOPY-WEDGE RESECTION LEFT LOWER LOBE;  Surgeon: Melrose Nakayama, MD;  Location: [place];  Service: Thoracic;  Laterality: Left;    Home Medications:  Allergies as of 11/08/2019   No Known Allergies     Medication List       Accurate as of November 08, 2019 11:59 PM. If you have any questions, ask your nurse or doctor.        albuterol 108 (90 Base) MCG/ACT inhaler Commonly known as: VENTOLIN HFA Inhale 1-2 puffs into the lungs every 6 (six) hours as needed for wheezing or shortness of breath.   albuterol (2.5 MG/3ML) 0.083% nebulizer solution Commonly known as: PROVENTIL Take 3 mLs (2.5 mg total) by nebulization every 4 (four) hours as needed for wheezing or shortness of breath.   ALPRAZolam 1 MG tablet Commonly known as: XANAX Take 1 tablet (1 mg total) by mouth as directed. 1/2 in am 1 pill qhs   alprazolam 2 MG tablet Commonly known as: XANAX Take 1-2 mg by mouth See admin instructions. Take 0.5 tablet (1 mg) by mouth in the morning & take 1 tablet (2 mg) by mouth at night.   ARIPiprazole 10 MG tablet Commonly known as: ABILIFY Take 10 mg by mouth daily.   aspirin EC 81 MG tablet Take 81 mg by mouth daily.     budesonide-formoterol 160-4.5 MCG/ACT inhaler Commonly known as: SYMBICORT Inhale 2 puffs into the lungs 2 (two) times daily. Rinse mouth out   celecoxib 200 MG capsule Commonly known as: CELEBREX Take 1 capsule (200 mg total) by mouth daily after breakfast.   clopidogrel 75 MG tablet Commonly known as: PLAVIX Take 1 tablet (75 mg total) by mouth daily. Reported on 01/15/2016   donepezil 5 MG tablet Commonly known as: Aricept Take 1 tablet (5 mg total) by mouth at bedtime.   FLUoxetine 20 MG capsule Commonly known as: PROZAC Take 60 mg by mouth daily.   folic acid 1 MG tablet Commonly known as: FOLVITE TAKE 1 TABLET  BY MOUTH EVERY DAY   memantine 5 MG tablet Commonly known as: NAMENDA Take 1 tablet (5 mg total) by mouth 2 (two) times daily. Taking qd   metoprolol succinate 25 MG 24 hr tablet Commonly known as: TOPROL-XL Take 1 tablet (25 mg total) by mouth daily.   multivitamin with minerals tablet Take 1 tablet by mouth daily.   Myrbetriq 50 MG Tb24 tablet Generic drug: mirabegron ER TAKE 1 TABLET BY MOUTH EVERY DAY What changed: how much to take   pantoprazole 40 MG tablet Commonly known as: PROTONIX Take 1 tablet (40 mg total) by mouth daily. 30 min before breakfast   rosuvastatin 10 MG tablet Commonly known as: CRESTOR Take 1 tablet (10 mg total) by mouth at bedtime. What changed: when to take this   tamsulosin 0.4 MG Caps capsule Commonly known as: FLOMAX Take 1 capsule (0.4 mg total) by mouth daily after supper. What changed: when to take this   traZODone 150 MG tablet Commonly known as: DESYREL Take 1 tablet (150 mg total) by mouth at bedtime.       Allergies: No Known Allergies  Family History: Family History  Problem Relation Age of Onset  . Heart disease Father        Unclear details. Father passed in late 48's 2/2 cancer.  . Colon cancer Father   . Alcohol abuse Sister   . Drug abuse Sister   . Anxiety disorder Sister   . Depression  Sister   . Kidney disease Neg Hx   . Prostate cancer Neg Hx     Social History:  reports that he quit smoking about 2 months ago. His smoking use included cigarettes. He started smoking about 57 years ago. He has never used smokeless tobacco. He reports current alcohol use. He reports current drug use. Frequency: 7.00 times per week. Drug: Marijuana.   Physical Exam: BP 111/78   Pulse 82   Ht 6' (1.829 m)   Wt 266 lb (120.7 kg)   BMI 36.08 kg/m   Constitutional:  Alert and oriented, No acute distress. HEENT: [place] AT, moist mucus membranes.  Trachea midline, no masses. Cardiovascular: No clubbing, cyanosis, or edema. Respiratory: Normal respiratory effort, no increased work of breathing. Skin: No rashes, bruises or suspicious lesions. Neurologic: Grossly intact, no focal deficits, moving all 4 extremities. Psychiatric: Normal mood and affect.  Laboratory Data:  Lab Results  Component Value Date   CREATININE 1.23 11/10/2019    Lab Results  Component Value Date   HGBA1C 6.2 09/14/2019    Urinalysis UA indicated 3-10 RBC.  Pertinent Imaging: Results for orders placed or performed in visit on 11/08/19  Microscopic Examination   URINE  Result Value Ref Range   WBC, UA 0-5 0 - 5 /hpf   RBC 3-10 (A) 0 - 2 /hpf   Epithelial Cells (non renal) None seen 0 - 10 /hpf   Bacteria, UA None seen None seen/Few  Urinalysis, Complete  Result Value Ref Range   Specific Gravity, UA 1.020 1.005 - 1.030   pH, UA 6.0 5.0 - 7.5   Color, UA Yellow Yellow   Appearance Ur Clear Clear   Leukocytes,UA Negative Negative   Protein,UA Negative Negative/Trace   Glucose, UA Negative Negative   Ketones, UA Negative Negative   RBC, UA 1+ (A) Negative   Bilirubin, UA Negative Negative   Urobilinogen, Ur 0.2 0.2 - 1.0 mg/dL   Nitrite, UA Negative Negative   Microscopic Examination See below:   BLADDER  SCAN AMB NON-IMAGING  Result Value Ref Range   Scan Result 4     Assessment & Plan:    1.  Prostate cancer  Most recent PSA0.2 on 11/04/2019, well controlled.  Return for PSA in 1 year  2. Urinary frequency/incontinence  Adequate emptying of bladder  Increased drinking of whiskey after cutting down on beer  Discussed EtOH being a contributing factor to his urinary symptoms  Sleep study recommended by PCP  Continue Myrbetriq 50 mg and flomax 0.4 mg   3. Microscopic hematuria  Urinalysis today indicated microscopic blood  He is high risk however due to many recent upper tract imaginings, will f/u with cysto in 4-6 weeks in office. Will consider further delayed upper tract imaging if microscopic hematuria persists depending on cystoscopic findings  4. Renal cyst MRI was personally reviewed today, agree with radiologic interpretation. Bosniak 79F renal cyst, will continue to follow Given decrease in size, this is relatively reassuring We will plan for MRI in 10/2020 He is agreeable this plan  Return in about 4 weeks (around 12/06/2019) for cysto .  [place] [address] [place], [place] [address] [phone]  I, Lucas Mallow, am acting as a scribe for Dr. Hollice Espy,  I have reviewed the above documentation for accuracy and completeness, and I agree with the above.   Hollice Espy, MD   I spent 40 total minutes on the day of the encounter including pre-visit review of the medical record, face-to-face time with the patient, and post visit ordering of labs/imaging/tests.

## 2019-11-07 NOTE — Progress Notes (Signed)
   11/07/19 0926  OBSTRUCTIVE SLEEP APNEA  Have you ever been diagnosed with sleep apnea through a sleep study? No  Do you snore loudly (loud enough to be heard through closed doors)?  0  Do you often feel tired, fatigued, or sleepy during the daytime (such as falling asleep during driving or talking to someone)? 1 (feels tired)  Has anyone observed you stop breathing during your sleep? 1  Do you have, or are you being treated for high blood pressure? 1  BMI more than 35 kg/m2? 1  Age > 50 (1-yes) 1  Neck circumference greater than:Male 16 inches or larger, Male 17inches or larger? 1  Male Gender (Yes=1) 1  Obstructive Sleep Apnea Score 7  Score 5 or greater  Results sent to PCP

## 2019-11-07 NOTE — Progress Notes (Signed)
PCP - Dr. Olivia Mackie McLean-Scocuzza Cardiologist - Dr. Fletcher Anon   Chest x-ray - today EKG - today Stress Test - N/A ECHO - 05/04/18 Cardiac Cath - 2006  Sleep Study - positive Stop Bang Assessment - sent to PCP   Blood Thinner Instructions: Instructed to hold Plavix 5 days prior to surgery by Dr. Roxan Hockey - last dose per patient was 11/03/19. Aspirin Instructions: to continue, but not take day of surgery  COVID TEST- today   Anesthesia review: yes, heart hx  Patient denies shortness of breath, fever, cough and chest pain at PAT appointment   All instructions explained to the patient, with a verbal understanding of the material. Patient agrees to go over the instructions while at home for a better understanding. Patient also instructed to self quarantine after being tested for COVID-19. The opportunity to ask questions was provided.

## 2019-11-07 NOTE — Pre-Procedure Instructions (Signed)
Christopher Orr  11/07/2019    Your procedure is scheduled on Wednesday, November 09, 2019 at 8:30 AM.   Report to [facility] Entrance "A" Admitting Office at 6:30 AM.   Call this number if you have problems the morning of surgery: [phone]   Questions prior to day of surgery, please call [phone] between 8 & 4 PM.   Remember:  Do not eat or drink after midnight Tuesday, 11/08/19.  Take these medicines the morning of surgery with A SIP OF WATER: Alprazolam (Xanax), Aripiprazole (Abilify), Fluoxetine (Paxil), Memantine (Namenda), Metoprolol (Toprol SL), Myrbetriq, Pantoprazole (Protoninx), Tamsulosin (Flomax), Symbicort inhaler, Albuterol (Proventil) nebulizier - if needed, Albuterol (Ventolin) inhaler - if needed (bring this inhaler with you day of surgery)  Stop Plavix as instructed by surgeon. Stop Multivitamins and NSAIDS (Celecoxib, Celebrex, Ibuprofen, Aleve, etc) as of today. Do not use Fish Oil, Herbal medications or other Aspirin products prior to surgery.  Continue Aspirin, but do not take morning of surgery.    Do not wear jewelry.  Do not wear lotions, powders, cologne or deodorant.  Men may shave face and neck.  Do not bring valuables to the hospital.  [facility] is not responsible for any belongings or valuables.  Contacts, dentures or bridgework may not be worn into surgery.  Leave your suitcase in the car.  After surgery it may be brought to your room.  For patients admitted to the hospital, discharge time will be determined by your treatment team.  [facility] - Preparing for Surgery  Before surgery, you can play an important role.  Because skin is not sterile, your skin needs to be as free of germs as possible.  You can reduce the number of germs on you skin by washing with CHG (chlorahexidine gluconate) soap before surgery.  CHG is an antiseptic cleaner which kills germs and bonds with the skin to continue killing germs even after washing.  Oral Hygiene  is also important in reducing the risk of infection.  Remember to brush your teeth with your regular toothpaste the morning of surgery.  Please DO NOT use if you have an allergy to CHG or antibacterial soaps.  If your skin becomes reddened/irritated stop using the CHG and inform your nurse when you arrive at Short Stay.  Do not shave (including legs and underarms) for at least 48 hours prior to the first CHG shower.  You may shave your face.  Please follow these instructions carefully:   1.  Shower with CHG Soap the night before surgery and the morning of Surgery.  2.  If you choose to wash your hair, wash your hair first as usual with your normal shampoo.  3.  After you shampoo, rinse your hair and body thoroughly to remove the shampoo. 4.  Use CHG as you would any other liquid soap.  You can apply chg directly to the skin and wash gently with a      scrungie or washcloth.           5.  Apply the CHG Soap to your body ONLY FROM THE NECK DOWN.   Do not use on open wounds or open sores. Avoid contact with your eyes, ears, mouth and genitals (private parts).  Wash genitals (private parts) with your normal soap - do this prior to using the CHG soap.  6.  Wash thoroughly, paying special attention to the area where your surgery will be performed.  7.  Thoroughly rinse your body with warm  water from the neck down.  8.  DO NOT shower/wash with your normal soap after using and rinsing off the CHG Soap.  9.  Pat yourself dry with a clean towel.            10.  Wear clean pajamas.            11.  Place clean sheets on your bed the night of your first shower and do not sleep with pets.  Day of Surgery  Shower as above. Do not apply any lotions/deodorants the morning of surgery.   Please wear clean clothes to the hospital. Remember to brush your teeth with toothpaste.   Please read over the fact sheets that you were given.

## 2019-11-08 ENCOUNTER — Ambulatory Visit: Payer: Medicare HMO | Admitting: Urology

## 2019-11-08 ENCOUNTER — Encounter: Payer: Self-pay | Admitting: Urology

## 2019-11-08 VITALS — BP 111/78 | HR 82 | Ht 72.0 in | Wt 266.0 lb

## 2019-11-08 DIAGNOSIS — N289 Disorder of kidney and ureter, unspecified: Secondary | ICD-10-CM | POA: Diagnosis not present

## 2019-11-08 DIAGNOSIS — R3129 Other microscopic hematuria: Secondary | ICD-10-CM | POA: Diagnosis not present

## 2019-11-08 DIAGNOSIS — C61 Malignant neoplasm of prostate: Secondary | ICD-10-CM

## 2019-11-08 DIAGNOSIS — N3941 Urge incontinence: Secondary | ICD-10-CM

## 2019-11-08 LAB — URINALYSIS, COMPLETE
Bilirubin, UA: NEGATIVE
Glucose, UA: NEGATIVE
Ketones, UA: NEGATIVE
Leukocytes,UA: NEGATIVE
Nitrite, UA: NEGATIVE
Protein,UA: NEGATIVE
Specific Gravity, UA: 1.02 (ref 1.005–1.030)
Urobilinogen, Ur: 0.2 mg/dL (ref 0.2–1.0)
pH, UA: 6 (ref 5.0–7.5)

## 2019-11-08 LAB — MICROSCOPIC EXAMINATION
Bacteria, UA: NONE SEEN
Epithelial Cells (non renal): NONE SEEN /hpf (ref 0–10)

## 2019-11-08 LAB — BLADDER SCAN AMB NON-IMAGING: Scan Result: 4

## 2019-11-08 MED ORDER — DEXTROSE 5 % IV SOLN
3.0000 g | INTRAVENOUS | Status: AC
  Administered 2019-11-09: 3 g via INTRAVENOUS
  Filled 2019-11-08: qty 3

## 2019-11-08 NOTE — Progress Notes (Signed)
Anesthesia Chart Review:  Follows with cardiology a hx of CAD s/p LAD stenting 2006 with Cypher DES. He was last seen 09/23/19 for cardiology clearance for US guided core biopsy. Per note, "Up coming core biopsy of parotid gland secondary to positive PET scan.  He has a history of CAD, as below. According to the Revised Cardiac Risk Index (RCRI), his Perioperative Risk of Major Cardiac Event is (%): 0.9. His Functional Capacity in METs is: 4.64 according to the Duke Activity Status Index (DASI).  His CAD is without anginal symptoms.  EKG today shows sinus rhythm 90 bpm with no acute ST/T wave changes.  No indication for ischemic evaluation at this time.  He is on guideline directed medical therapy.  He may proceed with upcoming ultrasound-guided core biopsy without further cardiac testing.  He is on DAPT as he has a first generation DES.  He may hold his Plavix 5 days prior to procedure as requested as his last cath in 2011 showed patent stent and he has had no anginal symptoms since."  History of tobacco abuse and COPD. Recent diagnosis of lung CA. Per Dr. Leonarda Salon consult note 10/25/19, "Mr. Hagood is a poor historian with some memory loss.  He does have some shortness of breath with exertion.  Activity is significantly limited by left leg pain.  He had a stent placed in 2006.  He is on Plavix for that and bruises easily.  He is not having any chest pain, pressure, or tightness with exertion.  He has had no change in appetite or weight loss.  He quit smoking a month ago."   History of Hep C s/p treatment with Harvoni.  Hx of ETOH abuse. Per PCP note 08/12/19, pt reportedly drinks 1 pint of liquor per day.  Preop labs reviewed, unremarkable.  EKG 11/07/19: NSR. LAD. Rate 63.  TTE 05/05/2018: Assessment: Technically difficult study due to body habitus. Moderate biatrial enlargement, moderately dilated sinus of Valsalva, mildly dilated aortic root and ascending aorta with ventricles appear normal  size. Normal LV systolic function.  EF 60 to 65%. Mild inferior hypokinesis. Mild left ventricular hypertrophy with grade 1 diastolic dysfunction. Trace to mild pulmonary regurgitation. Mild to moderate respiratory rotation. Normal pulmonary artery pressure. Mild mitral regurgitation. Trivial anterior pericardial effusion without hemodynamic compromise or tamponade.   Wynonia Musty [facility] Short Stay Center/Anesthesiology Phone [phone] 11/08/2019 9:41 AM

## 2019-11-08 NOTE — Anesthesia Preprocedure Evaluation (Addendum)
Anesthesia Evaluation  Patient identified by MRN, date of birth, ID band Patient awake    Reviewed: Allergy & Precautions, NPO status , Patient's Chart, lab work & pertinent test results  Airway Mallampati: I  TM Distance: >3 FB Neck ROM: Full    Dental   Pulmonary Patient abstained from smoking., former smoker,    Pulmonary exam normal        Cardiovascular hypertension, Pt. on medications + CAD and + Cardiac Stents  Normal cardiovascular exam     Neuro/Psych Anxiety Depression Bipolar Disorder Dementia    GI/Hepatic GERD  ,(+) Hepatitis -, C  Endo/Other    Renal/GU      Musculoskeletal   Abdominal   Peds  Hematology   Anesthesia Other Findings   Reproductive/Obstetrics                            Anesthesia Physical Anesthesia Plan  ASA: III  Anesthesia Plan: General   Post-op Pain Management:    Induction: Intravenous  PONV Risk Score and Plan: 2 and Ondansetron  Airway Management Planned: Double Lumen EBT  Additional Equipment: Arterial line, CVP and Ultrasound Guidance Line Placement  Intra-op Plan:   Post-operative Plan: Extubation in OR  Informed Consent: I have reviewed the patients History and Physical, chart, labs and discussed the procedure including the risks, benefits and alternatives for the proposed anesthesia with the patient or authorized representative who has indicated his/her understanding and acceptance.       Plan Discussed with: CRNA and Surgeon  Anesthesia Plan Comments: (Follows with cardiology a hx of CAD s/p LAD stenting 2006 with Cypher DES. He was last seen 09/23/19 for cardiology clearance for US guided core biopsy. Per note, "Up coming core biopsy of parotid gland secondary to positive PET scan.  He has a history of CAD, as below. According to the Revised Cardiac Risk Index (RCRI), his Perioperative Risk of Major Cardiac Event is (%): 0.9. His  Functional Capacity in METs is: 4.64 according to the Duke Activity Status Index (DASI).  His CAD is without anginal symptoms.  EKG today shows sinus rhythm 90 bpm with no acute ST/T wave changes.  No indication for ischemic evaluation at this time.  He is on guideline directed medical therapy.  He may proceed with upcoming ultrasound-guided core biopsy without further cardiac testing.  He is on DAPT as he has a first generation DES.  He may hold his Plavix 5 days prior to procedure as requested as his last cath in 2011 showed patent stent and he has had no anginal symptoms since."  History of tobacco abuse and COPD. Recent diagnosis of lung CA. Per Dr. Leonarda Salon consult note 10/25/19, "Christopher Orr is a poor historian with some memory loss.  He does have some shortness of breath with exertion.  Activity is significantly limited by left leg pain.  He had a stent placed in 2006.  He is on Plavix for that and bruises easily.  He is not having any chest pain, pressure, or tightness with exertion.  He has had no change in appetite or weight loss.  He quit smoking a month ago."   History of Hep C s/p treatment with Harvoni.  Hx of ETOH abuse. Per PCP note 08/12/19, pt reportedly drinks 1 pint of liquor per day.  Preop labs reviewed, unremarkable.  EKG 11/07/19: NSR. LAD. Rate 63.  TTE 05/05/2018: Assessment: Technically difficult study due to body habitus. Moderate biatrial  enlargement, moderately dilated sinus of Valsalva, mildly dilated aortic root and ascending aorta with ventricles appear normal size. Normal LV systolic function.  EF 60 to 65%. Mild inferior hypokinesis. Mild left ventricular hypertrophy with grade 1 diastolic dysfunction. Trace to mild pulmonary regurgitation. Mild to moderate respiratory rotation. Normal pulmonary artery pressure. Mild mitral regurgitation. Trivial anterior pericardial effusion without hemodynamic compromise or tamponade. )       Anesthesia Quick  Evaluation

## 2019-11-09 ENCOUNTER — Inpatient Hospital Stay (HOSPITAL_COMMUNITY): Payer: Medicare HMO

## 2019-11-09 ENCOUNTER — Other Ambulatory Visit: Payer: Self-pay

## 2019-11-09 ENCOUNTER — Inpatient Hospital Stay (HOSPITAL_COMMUNITY)
Admission: RE | Admit: 2019-11-09 | Discharge: 2019-11-14 | DRG: 164 | Disposition: A | Discharge status: Home Health | Payer: Medicare HMO | Attending: Thoracic Surgery (Cardiothoracic Vascular Surgery) | Admitting: Thoracic Surgery (Cardiothoracic Vascular Surgery)

## 2019-11-09 ENCOUNTER — Inpatient Hospital Stay (HOSPITAL_COMMUNITY): Payer: Medicare HMO | Admitting: Physician Assistant

## 2019-11-09 ENCOUNTER — Inpatient Hospital Stay (HOSPITAL_COMMUNITY): Payer: Medicare HMO | Admitting: Certified Registered Nurse Anesthetist

## 2019-11-09 ENCOUNTER — Encounter (HOSPITAL_COMMUNITY): Payer: Self-pay | Admitting: Thoracic Surgery (Cardiothoracic Vascular Surgery)

## 2019-11-09 ENCOUNTER — Encounter (HOSPITAL_COMMUNITY)
Admission: RE | Disposition: A | Discharge status: Home / Self Care | Payer: Self-pay | Source: Home / Self Care | Attending: Thoracic Surgery (Cardiothoracic Vascular Surgery)

## 2019-11-09 DIAGNOSIS — F319 Bipolar disorder, unspecified: Secondary | ICD-10-CM | POA: Diagnosis not present

## 2019-11-09 DIAGNOSIS — Z811 Family history of alcohol abuse and dependence: Secondary | ICD-10-CM | POA: Diagnosis not present

## 2019-11-09 DIAGNOSIS — Z818 Family history of other mental and behavioral disorders: Secondary | ICD-10-CM

## 2019-11-09 DIAGNOSIS — C3432 Malignant neoplasm of lower lobe, left bronchus or lung: Secondary | ICD-10-CM

## 2019-11-09 DIAGNOSIS — K7460 Unspecified cirrhosis of liver: Secondary | ICD-10-CM | POA: Diagnosis present

## 2019-11-09 DIAGNOSIS — R0600 Dyspnea, unspecified: Secondary | ICD-10-CM

## 2019-11-09 DIAGNOSIS — Z7982 Long term (current) use of aspirin: Secondary | ICD-10-CM

## 2019-11-09 DIAGNOSIS — I25119 Atherosclerotic heart disease of native coronary artery with unspecified angina pectoris: Secondary | ICD-10-CM | POA: Diagnosis present

## 2019-11-09 DIAGNOSIS — I340 Nonrheumatic mitral (valve) insufficiency: Secondary | ICD-10-CM | POA: Diagnosis present

## 2019-11-09 DIAGNOSIS — Z8249 Family history of ischemic heart disease and other diseases of the circulatory system: Secondary | ICD-10-CM | POA: Diagnosis not present

## 2019-11-09 DIAGNOSIS — Z87891 Personal history of nicotine dependence: Secondary | ICD-10-CM | POA: Diagnosis not present

## 2019-11-09 DIAGNOSIS — Z7951 Long term (current) use of inhaled steroids: Secondary | ICD-10-CM

## 2019-11-09 DIAGNOSIS — Z419 Encounter for procedure for purposes other than remedying health state, unspecified: Secondary | ICD-10-CM

## 2019-11-09 DIAGNOSIS — M79605 Pain in left leg: Secondary | ICD-10-CM | POA: Diagnosis present

## 2019-11-09 DIAGNOSIS — R911 Solitary pulmonary nodule: Secondary | ICD-10-CM

## 2019-11-09 DIAGNOSIS — Z8619 Personal history of other infectious and parasitic diseases: Secondary | ICD-10-CM | POA: Diagnosis not present

## 2019-11-09 DIAGNOSIS — Z791 Long term (current) use of non-steroidal anti-inflammatories (NSAID): Secondary | ICD-10-CM

## 2019-11-09 DIAGNOSIS — Z955 Presence of coronary angioplasty implant and graft: Secondary | ICD-10-CM | POA: Diagnosis not present

## 2019-11-09 DIAGNOSIS — I110 Hypertensive heart disease with heart failure: Secondary | ICD-10-CM | POA: Diagnosis present

## 2019-11-09 DIAGNOSIS — Z800 Family history of malignant neoplasm of digestive organs: Secondary | ICD-10-CM | POA: Diagnosis not present

## 2019-11-09 DIAGNOSIS — Z981 Arthrodesis status: Secondary | ICD-10-CM

## 2019-11-09 DIAGNOSIS — Z7902 Long term (current) use of antithrombotics/antiplatelets: Secondary | ICD-10-CM

## 2019-11-09 DIAGNOSIS — Z8719 Personal history of other diseases of the digestive system: Secondary | ICD-10-CM

## 2019-11-09 DIAGNOSIS — K219 Gastro-esophageal reflux disease without esophagitis: Secondary | ICD-10-CM | POA: Diagnosis present

## 2019-11-09 DIAGNOSIS — Z09 Encounter for follow-up examination after completed treatment for conditions other than malignant neoplasm: Secondary | ICD-10-CM

## 2019-11-09 DIAGNOSIS — Z902 Acquired absence of lung [part of]: Secondary | ICD-10-CM

## 2019-11-09 DIAGNOSIS — E785 Hyperlipidemia, unspecified: Secondary | ICD-10-CM | POA: Diagnosis present

## 2019-11-09 DIAGNOSIS — F4310 Post-traumatic stress disorder, unspecified: Secondary | ICD-10-CM | POA: Diagnosis not present

## 2019-11-09 DIAGNOSIS — J939 Pneumothorax, unspecified: Secondary | ICD-10-CM

## 2019-11-09 DIAGNOSIS — D62 Acute posthemorrhagic anemia: Secondary | ICD-10-CM | POA: Diagnosis not present

## 2019-11-09 DIAGNOSIS — I700 Atherosclerosis of aorta: Secondary | ICD-10-CM | POA: Diagnosis present

## 2019-11-09 DIAGNOSIS — I4891 Unspecified atrial fibrillation: Secondary | ICD-10-CM | POA: Diagnosis not present

## 2019-11-09 DIAGNOSIS — M1990 Unspecified osteoarthritis, unspecified site: Secondary | ICD-10-CM | POA: Diagnosis present

## 2019-11-09 DIAGNOSIS — Z8546 Personal history of malignant neoplasm of prostate: Secondary | ICD-10-CM

## 2019-11-09 DIAGNOSIS — J449 Chronic obstructive pulmonary disease, unspecified: Secondary | ICD-10-CM | POA: Diagnosis not present

## 2019-11-09 DIAGNOSIS — Z20822 Contact with and (suspected) exposure to covid-19: Secondary | ICD-10-CM | POA: Diagnosis present

## 2019-11-09 DIAGNOSIS — K5900 Constipation, unspecified: Secondary | ICD-10-CM | POA: Diagnosis not present

## 2019-11-09 DIAGNOSIS — Z4682 Encounter for fitting and adjustment of non-vascular catheter: Secondary | ICD-10-CM

## 2019-11-09 DIAGNOSIS — F0390 Unspecified dementia without behavioral disturbance: Secondary | ICD-10-CM | POA: Diagnosis present

## 2019-11-09 DIAGNOSIS — Z79899 Other long term (current) drug therapy: Secondary | ICD-10-CM

## 2019-11-09 HISTORY — PX: INTERCOSTAL NERVE BLOCK: SHX5021

## 2019-11-09 HISTORY — PX: XI ROBOTIC ASSISTED THORACOSCOPY- SEGMENTECTOMY: SHX6881

## 2019-11-09 HISTORY — DX: Solitary pulmonary nodule: R91.1

## 2019-11-09 LAB — GLUCOSE, CAPILLARY: Glucose-Capillary: 125 mg/dL — ABNORMAL HIGH (ref 70–99)

## 2019-11-09 SURGERY — RESECTION, LUNG, SEGMENTAL, ROBOT-ASSISTED
Anesthesia: General | Site: Chest | Laterality: Left

## 2019-11-09 MED ORDER — OXYCODONE HCL 5 MG PO TABS
5.0000 mg | ORAL_TABLET | ORAL | Status: DC | PRN
  Administered 2019-11-09 – 2019-11-11 (×4): 5 mg via ORAL
  Filled 2019-11-09 (×5): qty 1

## 2019-11-09 MED ORDER — LUNG SURGERY BOOK
Freq: Once | Status: AC
  Filled 2019-11-09: qty 1

## 2019-11-09 MED ORDER — MEPERIDINE HCL 25 MG/ML IJ SOLN: 6.2500 mg | INTRAMUSCULAR | Status: DC | PRN

## 2019-11-09 MED ORDER — SODIUM CHLORIDE FLUSH 0.9 % IV SOLN
INTRAVENOUS | Status: DC | PRN
  Administered 2019-11-09: 100 mL

## 2019-11-09 MED ORDER — MIRABEGRON ER 50 MG PO TB24
50.0000 mg | ORAL_TABLET | Freq: Every day | ORAL | Status: DC
  Administered 2019-11-10 – 2019-11-14 (×5): 50 mg via ORAL
  Filled 2019-11-09 (×5): qty 1

## 2019-11-09 MED ORDER — SENNOSIDES-DOCUSATE SODIUM 8.6-50 MG PO TABS
1.0000 | ORAL_TABLET | Freq: Every day | ORAL | Status: DC
  Administered 2019-11-09 – 2019-11-13 (×5): 1 via ORAL
  Filled 2019-11-09 (×5): qty 1

## 2019-11-09 MED ORDER — NALOXONE HCL 0.4 MG/ML IJ SOLN
INTRAMUSCULAR | Status: DC | PRN
  Administered 2019-11-09 (×2): .1 mg via INTRAVENOUS

## 2019-11-09 MED ORDER — PHENYLEPHRINE HCL-NACL 10-0.9 MG/250ML-% IV SOLN
INTRAVENOUS | Status: DC | PRN
  Administered 2019-11-09: 80 ug/min via INTRAVENOUS

## 2019-11-09 MED ORDER — FENTANYL CITRATE (PF) 250 MCG/5ML IJ SOLN
INTRAMUSCULAR | Status: DC | PRN
  Administered 2019-11-09: 100 ug via INTRAVENOUS
  Administered 2019-11-09 (×5): 50 ug via INTRAVENOUS

## 2019-11-09 MED ORDER — ALBUTEROL SULFATE (2.5 MG/3ML) 0.083% IN NEBU
INHALATION_SOLUTION | RESPIRATORY_TRACT | Status: AC
  Filled 2019-11-09: qty 3

## 2019-11-09 MED ORDER — BUPIVACAINE HCL (PF) 0.5 % IJ SOLN
INTRAMUSCULAR | Status: AC
  Filled 2019-11-09: qty 30

## 2019-11-09 MED ORDER — KETOROLAC TROMETHAMINE 15 MG/ML IJ SOLN
INTRAMUSCULAR | Status: DC | PRN
  Administered 2019-11-09: 15 mg via INTRAVENOUS

## 2019-11-09 MED ORDER — PHENYLEPHRINE 40 MCG/ML (10ML) SYRINGE FOR IV PUSH (FOR BLOOD PRESSURE SUPPORT)
PREFILLED_SYRINGE | INTRAVENOUS | Status: DC | PRN
  Administered 2019-11-09: 80 ug via INTRAVENOUS

## 2019-11-09 MED ORDER — BISACODYL 5 MG PO TBEC
10.0000 mg | DELAYED_RELEASE_TABLET | Freq: Every day | ORAL | Status: DC
  Administered 2019-11-11 – 2019-11-14 (×4): 10 mg via ORAL
  Filled 2019-11-09 (×5): qty 2

## 2019-11-09 MED ORDER — DEXAMETHASONE SODIUM PHOSPHATE 10 MG/ML IJ SOLN
INTRAMUSCULAR | Status: AC
  Filled 2019-11-09: qty 1

## 2019-11-09 MED ORDER — ENOXAPARIN SODIUM 40 MG/0.4ML ~~LOC~~ SOLN
40.0000 mg | SUBCUTANEOUS | Status: DC
  Administered 2019-11-09 – 2019-11-13 (×5): 40 mg via SUBCUTANEOUS
  Filled 2019-11-09 (×5): qty 0.4

## 2019-11-09 MED ORDER — BUPIVACAINE LIPOSOME 1.3 % IJ SUSP
20.0000 mL | INTRAMUSCULAR | Status: DC
  Filled 2019-11-09: qty 20

## 2019-11-09 MED ORDER — DEXAMETHASONE SODIUM PHOSPHATE 10 MG/ML IJ SOLN
INTRAMUSCULAR | Status: DC | PRN
  Administered 2019-11-09: 5 mg via INTRAVENOUS

## 2019-11-09 MED ORDER — ROCURONIUM BROMIDE 10 MG/ML (PF) SYRINGE
PREFILLED_SYRINGE | INTRAVENOUS | Status: AC
  Filled 2019-11-09: qty 10

## 2019-11-09 MED ORDER — INDOCYANINE GREEN 25 MG IV SOLR
INTRAVENOUS | Status: AC
  Filled 2019-11-09: qty 10

## 2019-11-09 MED ORDER — FENTANYL CITRATE (PF) 250 MCG/5ML IJ SOLN
INTRAMUSCULAR | Status: AC
  Filled 2019-11-09: qty 5

## 2019-11-09 MED ORDER — FOLIC ACID 1 MG PO TABS
1.0000 mg | ORAL_TABLET | Freq: Every day | ORAL | Status: DC
  Administered 2019-11-10 – 2019-11-11 (×2): 1 mg via ORAL
  Filled 2019-11-09 (×2): qty 1

## 2019-11-09 MED ORDER — MIDAZOLAM HCL 2 MG/2ML IJ SOLN
INTRAMUSCULAR | Status: AC
  Filled 2019-11-09: qty 2

## 2019-11-09 MED ORDER — ALBUTEROL SULFATE (2.5 MG/3ML) 0.083% IN NEBU
2.5000 mg | INHALATION_SOLUTION | Freq: Four times a day (QID) | RESPIRATORY_TRACT | Status: DC | PRN
  Administered 2019-11-09: 14:00:00 2.5 mg via RESPIRATORY_TRACT

## 2019-11-09 MED ORDER — ASPIRIN EC 81 MG PO TBEC
81.0000 mg | DELAYED_RELEASE_TABLET | Freq: Every day | ORAL | Status: DC
  Administered 2019-11-10 – 2019-11-14 (×5): 81 mg via ORAL
  Filled 2019-11-09 (×5): qty 1

## 2019-11-09 MED ORDER — ARIPIPRAZOLE 10 MG PO TABS
10.0000 mg | ORAL_TABLET | Freq: Every day | ORAL | Status: DC
  Administered 2019-11-09 – 2019-11-14 (×6): 10 mg via ORAL
  Filled 2019-11-09 (×6): qty 1

## 2019-11-09 MED ORDER — 0.9 % SODIUM CHLORIDE (POUR BTL) OPTIME
TOPICAL | Status: DC | PRN
  Administered 2019-11-09: 2000 mL

## 2019-11-09 MED ORDER — SUCCINYLCHOLINE CHLORIDE 200 MG/10ML IV SOSY
PREFILLED_SYRINGE | INTRAVENOUS | Status: AC
  Filled 2019-11-09: qty 10

## 2019-11-09 MED ORDER — ONDANSETRON HCL 4 MG/2ML IJ SOLN
INTRAMUSCULAR | Status: DC | PRN
  Administered 2019-11-09 (×2): 4 mg via INTRAVENOUS

## 2019-11-09 MED ORDER — SUGAMMADEX SODIUM 200 MG/2ML IV SOLN
INTRAVENOUS | Status: DC | PRN
  Administered 2019-11-09: 200 mg via INTRAVENOUS

## 2019-11-09 MED ORDER — TRAMADOL HCL 50 MG PO TABS
50.0000 mg | ORAL_TABLET | Freq: Four times a day (QID) | ORAL | Status: DC | PRN
  Administered 2019-11-10: 50 mg via ORAL
  Filled 2019-11-09: qty 1

## 2019-11-09 MED ORDER — ONDANSETRON HCL 4 MG/2ML IJ SOLN: 4.0000 mg | Freq: Four times a day (QID) | INTRAMUSCULAR | Status: DC | PRN

## 2019-11-09 MED ORDER — LIDOCAINE 2% (20 MG/ML) 5 ML SYRINGE
INTRAMUSCULAR | Status: DC | PRN
  Administered 2019-11-09: 100 mg via INTRAVENOUS

## 2019-11-09 MED ORDER — PHENYLEPHRINE 40 MCG/ML (10ML) SYRINGE FOR IV PUSH (FOR BLOOD PRESSURE SUPPORT)
PREFILLED_SYRINGE | INTRAVENOUS | Status: AC
  Filled 2019-11-09: qty 10

## 2019-11-09 MED ORDER — HYDROMORPHONE HCL 1 MG/ML IJ SOLN: 0.2500 mg | INTRAMUSCULAR | Status: DC | PRN

## 2019-11-09 MED ORDER — SUCCINYLCHOLINE 20MG/ML (10ML) SYRINGE FOR MEDFUSION PUMP - OPTIME
INTRAMUSCULAR | Status: DC | PRN
  Administered 2019-11-09: 140 mg via INTRAVENOUS

## 2019-11-09 MED ORDER — ROSUVASTATIN CALCIUM 5 MG PO TABS
10.0000 mg | ORAL_TABLET | Freq: Every day | ORAL | Status: DC
  Administered 2019-11-09 – 2019-11-13 (×5): 10 mg via ORAL
  Filled 2019-11-09 (×5): qty 2

## 2019-11-09 MED ORDER — STERILE WATER FOR INJECTION IJ SOLN
INTRAMUSCULAR | Status: AC
  Filled 2019-11-09: qty 10

## 2019-11-09 MED ORDER — SODIUM CHLORIDE 0.9 % IV SOLN: INTRAVENOUS | Status: DC | PRN

## 2019-11-09 MED ORDER — MOMETASONE FURO-FORMOTEROL FUM 200-5 MCG/ACT IN AERO: 2.0000 | INHALATION_SPRAY | Freq: Two times a day (BID) | RESPIRATORY_TRACT | Status: DC

## 2019-11-09 MED ORDER — CEFAZOLIN SODIUM-DEXTROSE 2-4 GM/100ML-% IV SOLN
2.0000 g | Freq: Three times a day (TID) | INTRAVENOUS | Status: AC
  Administered 2019-11-09 – 2019-11-10 (×2): 2 g via INTRAVENOUS
  Filled 2019-11-09 (×2): qty 100

## 2019-11-09 MED ORDER — EPHEDRINE 5 MG/ML INJ
INTRAVENOUS | Status: AC
  Filled 2019-11-09: qty 10

## 2019-11-09 MED ORDER — ONDANSETRON HCL 4 MG/2ML IJ SOLN: 4.0000 mg | Freq: Once | INTRAMUSCULAR | Status: DC | PRN

## 2019-11-09 MED ORDER — ALPRAZOLAM 0.5 MG PO TABS
1.0000 mg | ORAL_TABLET | Freq: Every day | ORAL | Status: DC
  Administered 2019-11-10 – 2019-11-14 (×5): 1 mg via ORAL
  Filled 2019-11-09: qty 2
  Filled 2019-11-09: qty 4
  Filled 2019-11-09 (×3): qty 2

## 2019-11-09 MED ORDER — TAMSULOSIN HCL 0.4 MG PO CAPS
0.4000 mg | ORAL_CAPSULE | Freq: Every day | ORAL | Status: DC
  Administered 2019-11-10 – 2019-11-13 (×3): 0.4 mg via ORAL
  Filled 2019-11-09 (×4): qty 1

## 2019-11-09 MED ORDER — DONEPEZIL HCL 10 MG PO TABS
5.0000 mg | ORAL_TABLET | Freq: Every day | ORAL | Status: DC
  Administered 2019-11-09 – 2019-11-13 (×5): 5 mg via ORAL
  Filled 2019-11-09 (×5): qty 1

## 2019-11-09 MED ORDER — ONDANSETRON HCL 4 MG/2ML IJ SOLN
INTRAMUSCULAR | Status: AC
  Filled 2019-11-09: qty 2

## 2019-11-09 MED ORDER — ACETAMINOPHEN 500 MG PO TABS
1000.0000 mg | ORAL_TABLET | Freq: Four times a day (QID) | ORAL | Status: AC
  Administered 2019-11-09 – 2019-11-14 (×15): 1000 mg via ORAL
  Filled 2019-11-09 (×15): qty 2

## 2019-11-09 MED ORDER — LACTATED RINGERS IV SOLN: INTRAVENOUS | Status: DC | PRN

## 2019-11-09 MED ORDER — INSULIN ASPART 100 UNIT/ML ~~LOC~~ SOLN
0.0000 [IU] | Freq: Four times a day (QID) | SUBCUTANEOUS | Status: DC
  Administered 2019-11-10: 1 [IU] via SUBCUTANEOUS

## 2019-11-09 MED ORDER — ESMOLOL HCL 100 MG/10ML IV SOLN
INTRAVENOUS | Status: DC | PRN
  Administered 2019-11-09: 5 mg via INTRAVENOUS
  Administered 2019-11-09: 20 mg via INTRAVENOUS
  Administered 2019-11-09: 5 mg via INTRAVENOUS
  Administered 2019-11-09 (×2): 20 mg via INTRAVENOUS
  Administered 2019-11-09: 30 mg via INTRAVENOUS

## 2019-11-09 MED ORDER — SODIUM CHLORIDE 0.9 % IR SOLN
Status: DC | PRN
  Administered 2019-11-09: 1000 mL

## 2019-11-09 MED ORDER — KETOROLAC TROMETHAMINE 15 MG/ML IJ SOLN
15.0000 mg | Freq: Four times a day (QID) | INTRAMUSCULAR | Status: DC
  Administered 2019-11-10 – 2019-11-11 (×5): 15 mg via INTRAVENOUS
  Filled 2019-11-09 (×5): qty 1

## 2019-11-09 MED ORDER — ALPRAZOLAM 0.5 MG PO TABS: 1.0000 mg | ORAL_TABLET | ORAL | Status: DC

## 2019-11-09 MED ORDER — KETOROLAC TROMETHAMINE 30 MG/ML IJ SOLN
INTRAMUSCULAR | Status: AC
  Filled 2019-11-09: qty 1

## 2019-11-09 MED ORDER — PANTOPRAZOLE SODIUM 40 MG PO TBEC
40.0000 mg | DELAYED_RELEASE_TABLET | Freq: Every day | ORAL | Status: DC
  Administered 2019-11-10 – 2019-11-14 (×5): 40 mg via ORAL
  Filled 2019-11-09 (×5): qty 1

## 2019-11-09 MED ORDER — ROCURONIUM BROMIDE 10 MG/ML (PF) SYRINGE
PREFILLED_SYRINGE | INTRAVENOUS | Status: DC | PRN
  Administered 2019-11-09 (×3): 50 mg via INTRAVENOUS

## 2019-11-09 MED ORDER — LIDOCAINE 2% (20 MG/ML) 5 ML SYRINGE
INTRAMUSCULAR | Status: AC
  Filled 2019-11-09: qty 5

## 2019-11-09 MED ORDER — TRAZODONE HCL 50 MG PO TABS
150.0000 mg | ORAL_TABLET | Freq: Every day | ORAL | Status: DC
  Administered 2019-11-10 – 2019-11-13 (×4): 150 mg via ORAL
  Filled 2019-11-09 (×5): qty 3

## 2019-11-09 MED ORDER — ACETAMINOPHEN 160 MG/5ML PO SOLN: 1000.0000 mg | Freq: Four times a day (QID) | ORAL | Status: AC

## 2019-11-09 MED ORDER — CLOPIDOGREL BISULFATE 75 MG PO TABS
75.0000 mg | ORAL_TABLET | Freq: Every day | ORAL | Status: DC
  Administered 2019-11-11 – 2019-11-14 (×4): 75 mg via ORAL
  Filled 2019-11-09 (×4): qty 1

## 2019-11-09 MED ORDER — PROPOFOL 10 MG/ML IV BOLUS
INTRAVENOUS | Status: AC
  Filled 2019-11-09: qty 40

## 2019-11-09 MED ORDER — FLUOXETINE HCL 20 MG PO CAPS
60.0000 mg | ORAL_CAPSULE | Freq: Every day | ORAL | Status: DC
  Administered 2019-11-10 – 2019-11-14 (×5): 60 mg via ORAL
  Filled 2019-11-09 (×5): qty 3

## 2019-11-09 MED ORDER — HEMOSTATIC AGENTS (NO CHARGE) OPTIME
TOPICAL | Status: DC | PRN
  Administered 2019-11-09: 2 via TOPICAL

## 2019-11-09 MED ORDER — FENTANYL CITRATE (PF) 100 MCG/2ML IJ SOLN
25.0000 ug | INTRAMUSCULAR | Status: DC | PRN
  Administered 2019-11-09 – 2019-11-10 (×4): 50 ug via INTRAVENOUS
  Administered 2019-11-11: 25 ug via INTRAVENOUS
  Filled 2019-11-09 (×5): qty 2

## 2019-11-09 MED ORDER — SODIUM CHLORIDE 0.9 % IV SOLN: INTRAVENOUS | Status: DC

## 2019-11-09 MED ORDER — MEMANTINE HCL 5 MG PO TABS
5.0000 mg | ORAL_TABLET | Freq: Two times a day (BID) | ORAL | Status: DC
  Administered 2019-11-09 – 2019-11-14 (×10): 5 mg via ORAL
  Filled 2019-11-09 (×12): qty 1

## 2019-11-09 MED ORDER — METOPROLOL SUCCINATE ER 25 MG PO TB24
25.0000 mg | ORAL_TABLET | Freq: Every day | ORAL | Status: DC
  Administered 2019-11-10 – 2019-11-14 (×5): 25 mg via ORAL
  Filled 2019-11-09 (×5): qty 1

## 2019-11-09 MED ORDER — LABETALOL HCL 5 MG/ML IV SOLN
INTRAVENOUS | Status: DC | PRN
  Administered 2019-11-09: 10 mg via INTRAVENOUS

## 2019-11-09 MED ORDER — ALPRAZOLAM 0.5 MG PO TABS
2.0000 mg | ORAL_TABLET | Freq: Every day | ORAL | Status: DC
  Administered 2019-11-09 – 2019-11-13 (×5): 2 mg via ORAL
  Filled 2019-11-09: qty 4
  Filled 2019-11-09: qty 8
  Filled 2019-11-09 (×3): qty 4

## 2019-11-09 MED ORDER — PROPOFOL 10 MG/ML IV BOLUS
INTRAVENOUS | Status: DC | PRN
  Administered 2019-11-09: 200 mg via INTRAVENOUS

## 2019-11-09 MED ORDER — EPHEDRINE SULFATE 50 MG/ML IJ SOLN
INTRAMUSCULAR | Status: DC | PRN
  Administered 2019-11-09: 5 mg via INTRAVENOUS
  Administered 2019-11-09: 10 mg via INTRAVENOUS

## 2019-11-09 MED ORDER — MIDAZOLAM HCL 5 MG/5ML IJ SOLN
INTRAMUSCULAR | Status: DC | PRN
  Administered 2019-11-09: 2 mg via INTRAVENOUS

## 2019-11-09 SURGICAL SUPPLY — 125 items
APPLIER CLIP ROT 10 11.4 M/L (STAPLE)
BLADE CLIPPER SURG (BLADE) ×3 IMPLANT
BLADE SURG SZ11 CARB STEEL (BLADE) ×3 IMPLANT
BNDG COHESIVE 4X5 TAN STRL (GAUZE/BANDAGES/DRESSINGS) ×3 IMPLANT
BNDG COHESIVE 6X5 TAN STRL LF (GAUZE/BANDAGES/DRESSINGS) ×3 IMPLANT
CANISTER SUCT 3000ML PPV (MISCELLANEOUS) ×6 IMPLANT
CANNULA REDUC XI 12-8 STAPL (CANNULA) ×4
CANNULA REDUC XI 12-8MM STAPL (CANNULA) ×2
CANNULA REDUCER 12-8 DVNC XI (CANNULA) ×2 IMPLANT
CATH THORACIC 28FR (CATHETERS) IMPLANT
CATH THORACIC 28FR RT ANG (CATHETERS) IMPLANT
CATH THORACIC 36FR (CATHETERS) IMPLANT
CATH THORACIC 36FR RT ANG (CATHETERS) IMPLANT
CLIP APPLIE ROT 10 11.4 M/L (STAPLE) IMPLANT
CLIP VESOCCLUDE MED 6/CT (CLIP) IMPLANT
CNTNR URN SCR LID CUP LEK RST (MISCELLANEOUS) ×5 IMPLANT
CONN ST 1/4X3/8  BEN (MISCELLANEOUS)
CONN ST 1/4X3/8 BEN (MISCELLANEOUS) IMPLANT
CONN Y 3/8X3/8X3/8  BEN (MISCELLANEOUS)
CONN Y 3/8X3/8X3/8 BEN (MISCELLANEOUS) IMPLANT
CONT SPEC 4OZ CLIKSEAL STRL BL (MISCELLANEOUS) ×21 IMPLANT
CONT SPEC 4OZ STRL OR WHT (MISCELLANEOUS) ×15
COVER SURGICAL LIGHT HANDLE (MISCELLANEOUS) IMPLANT
DEFOGGER SCOPE WARMER CLEARIFY (MISCELLANEOUS) ×3 IMPLANT
DERMABOND ADVANCED (GAUZE/BANDAGES/DRESSINGS) ×2
DERMABOND ADVANCED .7 DNX12 (GAUZE/BANDAGES/DRESSINGS) ×1 IMPLANT
DRAIN CHANNEL 28F RND 3/8 FF (WOUND CARE) IMPLANT
DRAIN CHANNEL 32F RND 10.7 FF (WOUND CARE) IMPLANT
DRAPE ARM DVNC X/XI (DISPOSABLE) ×4 IMPLANT
DRAPE COLUMN DVNC XI (DISPOSABLE) ×1 IMPLANT
DRAPE CV SPLIT W-CLR ANES SCRN (DRAPES) ×3 IMPLANT
DRAPE DA VINCI XI ARM (DISPOSABLE) ×12
DRAPE DA VINCI XI COLUMN (DISPOSABLE) ×3
DRAPE INCISE IOBAN 66X45 STRL (DRAPES) IMPLANT
DRAPE ORTHO SPLIT 77X108 STRL (DRAPES) ×3
DRAPE SURG ORHT 6 SPLT 77X108 (DRAPES) ×1 IMPLANT
DRAPE WARM FLUID 44X44 (DRAPES) IMPLANT
ELECT BLADE 6.5 EXT (BLADE) IMPLANT
ELECT REM PT RETURN 9FT ADLT (ELECTROSURGICAL) ×3
ELECTRODE REM PT RTRN 9FT ADLT (ELECTROSURGICAL) ×1 IMPLANT
GAUZE KITTNER 4X8 (MISCELLANEOUS) ×15 IMPLANT
GAUZE SPONGE 4X4 12PLY STRL (GAUZE/BANDAGES/DRESSINGS) ×3 IMPLANT
GLOVE BIO SURGEON STRL SZ7 (GLOVE) ×6 IMPLANT
GLOVE TRIUMPH SURG SIZE 7.5 (KITS) ×6 IMPLANT
GOWN STRL REUS W/ TWL LRG LVL3 (GOWN DISPOSABLE) ×2 IMPLANT
GOWN STRL REUS W/ TWL XL LVL3 (GOWN DISPOSABLE) ×4 IMPLANT
GOWN STRL REUS W/TWL LRG LVL3 (GOWN DISPOSABLE) ×6
GOWN STRL REUS W/TWL XL LVL3 (GOWN DISPOSABLE) ×12
HEMOSTAT SURGICEL 2X14 (HEMOSTASIS) ×15 IMPLANT
IRRIGATION STRYKERFLOW (MISCELLANEOUS) IMPLANT
IRRIGATOR STRYKERFLOW (MISCELLANEOUS)
KIT BASIN OR (CUSTOM PROCEDURE TRAY) ×3 IMPLANT
KIT SUCTION CATH 14FR (SUCTIONS) IMPLANT
KIT TURNOVER KIT B (KITS) ×3 IMPLANT
LOOP VESSEL SUPERMAXI WHITE (MISCELLANEOUS) IMPLANT
NEEDLE HYPO 25GX1X1/2 BEV (NEEDLE) ×3 IMPLANT
NEEDLE SPNL 22GX3.5 QUINCKE BK (NEEDLE) ×3 IMPLANT
NS IRRIG 1000ML POUR BTL (IV SOLUTION) ×3 IMPLANT
PACK CHEST (CUSTOM PROCEDURE TRAY) ×3 IMPLANT
PAD ARMBOARD 7.5X6 YLW CONV (MISCELLANEOUS) ×6 IMPLANT
PATTIES SURGICAL 3 X3 (GAUZE/BANDAGES/DRESSINGS)
PATTIES SURGICAL 3X3 (GAUZE/BANDAGES/DRESSINGS) IMPLANT
POUCH ENDO CATCH II 15MM (MISCELLANEOUS) IMPLANT
POUCH RETRIEVAL ECOSAC 10 (ENDOMECHANICALS) ×1 IMPLANT
POUCH RETRIEVAL ECOSAC 10MM (ENDOMECHANICALS) ×3
POUCH SPECIMEN RETRIEVAL 10MM (ENDOMECHANICALS) IMPLANT
RELOAD STAPLER 3.5X45 BLU DVNC (STAPLE) ×10 IMPLANT
RELOAD STAPLER 4.3X45 GRN DVNC (STAPLE) ×2 IMPLANT
SCISSORS LAP 5X35 DISP (ENDOMECHANICALS) IMPLANT
SEAL CANN UNIV 5-8 DVNC XI (MISCELLANEOUS) ×2 IMPLANT
SEAL XI 5MM-8MM UNIVERSAL (MISCELLANEOUS) ×6
SEALANT PROGEL (MISCELLANEOUS) IMPLANT
SEALANT SURG COSEAL 4ML (VASCULAR PRODUCTS) IMPLANT
SEALANT SURG COSEAL 8ML (VASCULAR PRODUCTS) IMPLANT
SET TUBE SMOKE EVAC HIGH FLOW (TUBING) ×3 IMPLANT
SHEARS HARMONIC HDI 20CM (ELECTROSURGICAL) IMPLANT
SHEET MEDIUM DRAPE 40X70 STRL (DRAPES) ×3 IMPLANT
SOL ANTI FOG 6CC (MISCELLANEOUS) IMPLANT
SOLUTION ANTI FOG 6CC (MISCELLANEOUS)
SPECIMEN JAR MEDIUM (MISCELLANEOUS) IMPLANT
SPONGE INTESTINAL PEANUT (DISPOSABLE) IMPLANT
SPONGE TONSIL TAPE 1 RFD (DISPOSABLE) IMPLANT
STAPLER 45 DA VINCI SURE FORM (STAPLE) ×3
STAPLER 45 SUREFORM DVNC (STAPLE) ×1 IMPLANT
STAPLER CANNULA SEAL DVNC XI (STAPLE) ×2 IMPLANT
STAPLER CANNULA SEAL XI (STAPLE) ×6
STAPLER RELOAD 3.5X45 BLU DVNC (STAPLE) ×10
STAPLER RELOAD 3.5X45 BLUE (STAPLE) ×30
STAPLER RELOAD 4.3X45 GREEN (STAPLE) ×6
STAPLER RELOAD 4.3X45 GRN DVNC (STAPLE) ×2
STOPCOCK 4 WAY LG BORE MALE ST (IV SETS) ×3 IMPLANT
SUT PDS AB 3-0 SH 27 (SUTURE) IMPLANT
SUT PROLENE 4 0 RB 1 (SUTURE)
SUT PROLENE 4-0 RB1 .5 CRCL 36 (SUTURE) IMPLANT
SUT SILK  1 MH (SUTURE) ×6
SUT SILK 1 MH (SUTURE) ×2 IMPLANT
SUT SILK 1 TIES 10X30 (SUTURE) ×3 IMPLANT
SUT SILK 2 0 SH (SUTURE) IMPLANT
SUT SILK 2 0SH CR/8 30 (SUTURE) IMPLANT
SUT SILK 3 0 SH 30 (SUTURE) IMPLANT
SUT SILK 3 0SH CR/8 30 (SUTURE) IMPLANT
SUT VIC AB 1 CTX 36 (SUTURE)
SUT VIC AB 1 CTX36XBRD ANBCTR (SUTURE) IMPLANT
SUT VIC AB 2-0 CTX 36 (SUTURE) IMPLANT
SUT VIC AB 3-0 MH 27 (SUTURE) IMPLANT
SUT VIC AB 3-0 X1 27 (SUTURE) ×3 IMPLANT
SUT VICRYL 0 TIES 12 18 (SUTURE) ×3 IMPLANT
SUT VICRYL 0 UR6 27IN ABS (SUTURE) ×6 IMPLANT
SUT VICRYL 2 TP 1 (SUTURE) IMPLANT
SYR 10ML LL (SYRINGE) ×3 IMPLANT
SYR 20ML ECCENTRIC (SYRINGE) ×3 IMPLANT
SYR 30ML LL (SYRINGE) ×3 IMPLANT
SYR 50ML LL SCALE MARK (SYRINGE) ×3 IMPLANT
SYSTEM RETRIEVAL ANCHOR 8 (MISCELLANEOUS) ×6 IMPLANT
SYSTEM SAHARA CHEST DRAIN ATS (WOUND CARE) ×3 IMPLANT
TAPE CLOTH 4X10 WHT NS (GAUZE/BANDAGES/DRESSINGS) ×3 IMPLANT
TAPE CLOTH SURG 4X10 WHT LF (GAUZE/BANDAGES/DRESSINGS) ×3 IMPLANT
TIP APPLICATOR SPRAY EXTEND 16 (VASCULAR PRODUCTS) IMPLANT
TOWEL GREEN STERILE (TOWEL DISPOSABLE) ×3 IMPLANT
TOWEL GREEN STERILE FF (TOWEL DISPOSABLE) IMPLANT
TRAY FOLEY MTR SLVR 16FR STAT (SET/KITS/TRAYS/PACK) ×3 IMPLANT
TROCAR BLADELESS 15MM (ENDOMECHANICALS) IMPLANT
TROCAR XCEL 12X100 BLDLESS (ENDOMECHANICALS) ×3 IMPLANT
TUBING EXTENTION W/L.L. (IV SETS) IMPLANT
WATER STERILE IRR 1000ML POUR (IV SOLUTION) ×3 IMPLANT

## 2019-11-09 NOTE — Anesthesia Procedure Notes (Addendum)
Arterial Line Insertion Start/End4/14/2021 7:30 AM Performed by: Lillia Abed, MD, Milford Cage, CRNA, CRNA  Patient location: Pre-op. Preanesthetic checklist: patient identified, IV checked, site marked, risks and benefits discussed, surgical consent, monitors and equipment checked, pre-op evaluation, timeout performed and anesthesia consent Lidocaine 1% used for infiltration Left, radial was placed Catheter size: 20 G Hand hygiene performed  and maximum sterile barriers used  Allen's test indicative of satisfactory collateral circulation Attempts: 1 Procedure performed without using ultrasound guided technique. Following insertion, dressing applied and Biopatch. Post procedure assessment: normal  Patient tolerated the procedure well with no immediate complications. Additional procedure comments: Performed by Claudie Revering.

## 2019-11-09 NOTE — Plan of Care (Signed)

## 2019-11-09 NOTE — Plan of Care (Signed)

## 2019-11-09 NOTE — Interval H&P Note (Signed)
History and Physical Interval Note:  11/09/2019 8:05 AM  Christopher Orr  has presented today for surgery, with the diagnosis of Squamous cell carcinoma left lower lobe.  The various methods of treatment have been discussed with the patient and family. After consideration of risks, benefits and other options for treatment, the patient has consented to  Procedure(s): XI ROBOTIC ASSISTED THORACOSCOPY-SEGMENTECTOMY or LOWER LOBECTOMY (Left) as a surgical intervention.  The patient's history has been reviewed, patient examined, no change in status, stable for surgery.  I have reviewed the patient's chart and labs.  Questions were answered to the patient's satisfaction.     Melrose Nakayama

## 2019-11-09 NOTE — Transfer of Care (Addendum)
Immediate Anesthesia Transfer of Care Note  Patient: Christopher Orr  Procedure(s) Performed: XI ROBOTIC ASSISTED THORACOSCOPY-WEDGE RESECTION LEFT LOWER LOBE (Left Chest) Intercostal Nerve Block (Left Chest)  Patient Location: PACU  Anesthesia Type:General  Level of Consciousness: drowsy and patient cooperative  Airway & Oxygen Therapy: Patient connected to face mask oxygen  Post-op Assessment: Report given to RN  Post vital signs: stable  Last Vitals:  Vitals Value Taken Time  BP 130/78 11/09/19 1228  Temp    Pulse 81 11/09/19 1239  Resp 18 11/09/19 1239  SpO2 91 % 11/09/19 1239  Vitals shown include unvalidated device data.  Last Pain:  Vitals:   11/09/19 6440  TempSrc:   PainSc: 0-No pain         Complications: No apparent anesthesia complications

## 2019-11-09 NOTE — Op Note (Signed)
NAME: Christopher Orr, Christopher Orr MEDICAL RECORD [id number] ACCOUNT [id number] DATE OF BIRTH:02-Jan-1946 FACILITY: [facility] LOCATION: [facility] PHYSICIAN:Glendene Wyer Chaya Jan, MD  OPERATIVE REPORT  DATE OF PROCEDURE:  11/09/2019  PREOPERATIVE DIAGNOSIS:  Squamous cell carcinoma, left lower lobe, clinical stage IA (T1, N0).  POSTOPERATIVE DIAGNOSIS:  Squamous cell carcinoma, left lower lobe, clinical stage IA (T1, N0).  PROCEDURES:   1.  Xi robotic left lower lobe wedge resection.  2.  Lymph node sampling. 3.  Intercostal nerve blocks at levels 3 through 10.  SURGEON:  Modesto Charon, MD  ASSISTANT:  Lars Pinks, PA-C  ANESTHESIA:  General.  FINDINGS:  Nodule in posterior aspect of left lower lobe.  Margins negative for tumor.  Benign appearing lymph nodes.  CLINICAL NOTE:  Christopher Orr is a 74 year old gentleman with a history of tobacco abuse, COPD, as well as multiple other medical problems.  He recently was diagnosed with a squamous cell carcinoma of the left lower lobe.  Clinically, this was a T1, N0,  stage IA lesion.  Needle biopsy confirmed squamous cell carcinoma and he was referred for surgical resection.  The indications, risks, benefits, and alternatives were discussed in detail with the patient.  He understood and accepted the risks and agreed  to proceed.  OPERATIVE NOTE:  The patient was brought to the preoperative holding area on 11/09/2019.  Anesthesia established intravenous access and placed an arterial blood pressure monitoring line.  He was taken to the operating room, anesthetized and intubated  with a double lumen endotracheal tube.  Intravenous antibiotics were administered.  A Foley catheter was placed.  Sequential compression devices were placed on the calves for DVT prophylaxis.  He was placed in a right lateral decubitus position and the  left chest was prepped and draped in the usual sterile fashion.  Single-lung ventilation of the right lung was initiated and  was tolerated well throughout the procedure.  A timeout was performed.  A solution containing 20 mL of liposomal bupivacaine, 30 mL of 0.5% bupivacaine and 50 mL of saline was prepared.  This was used for the intercostal nerve blocks and also was injected directly at the incision sites prior to  making the incisions.  An incision was made in the 8th interspace in the midaxillary line.  An 8 mm port was inserted.  The thoracoscope was advanced through the port.  Three additional ports were placed in the 8th interspace, 12 mm ports were placed anterior and posterior to  the camera port and then finally a posterior 8 mm port was placed as well.  These were all placed with thoracoscopic visualization.  A 12 mm assistant port then was placed in the 10th interspace centered between the 2 anterior 8th interspace ports.   Intercostal nerve blocks then were performed from the 3rd to the 10th interspace.  Ten mL of the bupivacaine solution was injected into a subpleural plane at each of these levels.  The robot then was brought to the operative field and deployed.  The camera port was docked.  Targeting was performed.  The remainder of the ports were docked.  Instruments were inserted with thoracoscopic visualization.  The lung was retracted  superiorly, exposing the inferior pulmonary ligament.  This was divided with cautery.  A level 9 lymph node was removed.  All lymph nodes that were removed appeared grossly benign and were all sent for permanent pathology.  The pleural reflection was  divided at the hilum posteriorly and level 7 node was removed and sent for  pathology as well.  Anteriorly, level 10 and level 5 nodes were also sent.  The fissure was inspected.  There were adhesions.  Thin filmy adhesions were taken down, but the  fissure was incomplete.  Inspection of the lung revealed the tumor in the posterior aspect of the lung in its midportion.  This was not favorable anatomy for a segmentectomy.  It was  felt that given its peripheral location, his multiple  comorbidities, a wedge resection would be preferable to a lobectomy.  While exposing the tumor, a tear occurred in the visceral pleura.  This was removed with a wedge resection.  The specimen was sent for permanent pathology only.  A wedge resection then  was performed of the tumor, maintaining a 2 cm gross margin.  This was done with sequential firings of the robotic stapler using both blue and green cartridges.  Once the specimen had been completely resected, it was placed into an endoscopic retrieval  bag and removed through the assistant port.  There was good hemostasis at the staple lines.  Frozen section of the margin revealed no evidence of tumor.  The chest was copiously irrigated with warm saline.  Inspection was made for hemostasis.  The robot  was undocked.  A 28-French Blake drain was placed through the anterior most 8th interspace port incision and secured with #1 silk sutures.  Dual-lung ventilation was resumed.  The remaining ports were removed.  Those incisions were closed with 0 Vicryl  fascial sutures and 3-0 Vicryl subcuticular sutures.  The chest tube was placed to Pleur-Evac.  The patient then was placed back in a supine position.  He was extubated in the operating room and taken to the [facility] in good condition.  VN/NUANCE  D:11/09/2019 T:11/09/2019 JOB:[id number]

## 2019-11-09 NOTE — Anesthesia Postprocedure Evaluation (Signed)
Anesthesia Post Note  Patient: Christopher Orr  Procedure(s) Performed: XI ROBOTIC ASSISTED THORACOSCOPY-WEDGE RESECTION LEFT LOWER LOBE (Left Chest) Intercostal Nerve Block (Left Chest)     Patient location during evaluation: PACU Anesthesia Type: General Level of consciousness: awake and alert Pain management: pain level controlled Vital Signs Assessment: post-procedure vital signs reviewed and stable Respiratory status: spontaneous breathing, nonlabored ventilation, respiratory function stable and patient connected to nasal cannula oxygen Cardiovascular status: blood pressure returned to baseline and stable Postop Assessment: no apparent nausea or vomiting Anesthetic complications: no    Last Vitals:  Vitals:   11/09/19 1429 11/09/19 1430  BP:    Pulse: 84 84  Resp: 11 14  Temp:  (!) 36.2 C  SpO2: 92%     Last Pain:  Vitals:   11/09/19 1230  TempSrc:   PainSc: [name]

## 2019-11-09 NOTE — Anesthesia Procedure Notes (Addendum)
Procedure Name: Intubation Date/Time: 11/09/2019 8:47 AM Performed by: Milford Cage, CRNA Pre-anesthesia Checklist: Patient identified, Emergency Drugs available, Suction available and Patient being monitored Patient Re-evaluated:Patient Re-evaluated prior to induction Oxygen Delivery Method: Circle System Utilized Preoxygenation: Pre-oxygenation with 100% oxygen Induction Type: IV induction Ventilation: Mask ventilation without difficulty Laryngoscope Size: Mac and 4 Grade View: Grade II Endobronchial tube: Double lumen EBT and 39 Fr Number of attempts: 1 Airway Equipment and Method: Stylet Placement Confirmation: ETT inserted through vocal cords under direct vision,  positive ETCO2 and breath sounds checked- equal and bilateral Secured at: 30 cm Tube secured with: Tape Dental Injury: Teeth and Oropharynx as per pre-operative assessment  Comments: Performed by Claudie Revering

## 2019-11-09 NOTE — Brief Op Note (Addendum)
11/09/2019  11:48 AM  PATIENT:  Christopher Orr  74 y.o. male  PRE-OPERATIVE DIAGNOSIS:  Squamous cell carcinoma left lower lobe- Clinical stage IA (T1,N0)  POST-OPERATIVE DIAGNOSIS:  Squamous cell carcinoma left lower lobe- Clinical stage IA (T1,N0)  PROCEDURE:   XI ROBOTIC ASSISTED LEFT THORACOSCOPY, LLL WEDGE RESECTION,  LYMPH NODE SAMPLING and  INTERCOSTAL NERVE BLOCK  SURGEON:  Surgeon(s) and Role:    Melrose Nakayama, MD - Primary  PHYSICIAN ASSISTANT: Lars Pinks PA-C  ANESTHESIA:   general  EBL:  75 mL   BLOOD ADMINISTERED:none  DRAINS: 28 French chest tube placed in the left pleural space   LOCAL MEDICATIONS USED:  OTHER Exparel  SPECIMEN:  Source of Specimen:  LLL wedge resection, multiple lymph nodes  DISPOSITION OF SPECIMEN:  PATHOLOGY  COUNTS CORRECT:  YES  DICTATION: .Dragon Dictation  PLAN OF CARE: Admit to inpatient   PATIENT DISPOSITION:  ICU - intubated and hemodynamically stable.   Delay start of Pharmacological VTE agent (>24hrs) due to surgical blood loss or risk of bleeding: no

## 2019-11-09 NOTE — Anesthesia Procedure Notes (Signed)
Central Venous Catheter Insertion Performed by: Lillia Abed, MD, anesthesiologist Start/End4/14/2021 8:15 AM, 11/09/2019 8:25 AM Patient location: Pre-op. Preanesthetic checklist: patient identified, IV checked, risks and benefits discussed, surgical consent, monitors and equipment checked, pre-op evaluation, timeout performed and anesthesia consent Position: Trendelenburg Lidocaine 1% used for infiltration and patient sedated Hand hygiene performed , maximum sterile barriers used  and Seldinger technique used Catheter size: 8 Fr Total catheter length 16. Central line was placed.Double lumen Procedure performed using ultrasound guided technique. Ultrasound Notes:anatomy identified, needle tip was noted to be adjacent to the nerve/plexus identified, no ultrasound evidence of intravascular and/or intraneural injection and image(s) printed for medical record Attempts: 1 Following insertion, dressing applied, line sutured and Biopatch. Post procedure assessment: blood return through all ports, free fluid flow and no air  Patient tolerated the procedure well with no immediate complications.

## 2019-11-10 ENCOUNTER — Inpatient Hospital Stay (HOSPITAL_COMMUNITY): Payer: Medicare HMO

## 2019-11-10 LAB — BASIC METABOLIC PANEL
Anion gap: 6 (ref 5–15)
BUN: 17 mg/dL (ref 8–23)
CO2: 23 mmol/L (ref 22–32)
Calcium: 8.2 mg/dL — ABNORMAL LOW (ref 8.9–10.3)
Chloride: 111 mmol/L (ref 98–111)
Creatinine, Ser: 1.23 mg/dL (ref 0.61–1.24)
GFR calc Af Amer: >60 mL/min (ref >60)
GFR calc non Af Amer: 58 mL/min — ABNORMAL LOW (ref >60)
Glucose, Bld: 130 mg/dL — ABNORMAL HIGH (ref 70–99)
Potassium: 4.4 mmol/L (ref 3.5–5.1)
Sodium: 140 mmol/L (ref 135–145)

## 2019-11-10 LAB — CBC
HCT: 33.3 % — ABNORMAL LOW (ref 39.0–52.0)
Hemoglobin: 10.8 g/dL — ABNORMAL LOW (ref 13.0–17.0)
MCH: 33.4 pg (ref 26.0–34.0)
MCHC: 32.4 g/dL (ref 30.0–36.0)
MCV: 103.1 fL — ABNORMAL HIGH (ref 80.0–100.0)
Platelets: 208 10*3/uL (ref 150–400)
RBC: 3.23 MIL/uL — ABNORMAL LOW (ref 4.22–5.81)
RDW: 14.5 % (ref 11.5–15.5)
WBC: 11.1 10*3/uL — ABNORMAL HIGH (ref 4.0–10.5)
nRBC: 0 % (ref 0.0–0.2)

## 2019-11-10 LAB — GLUCOSE, CAPILLARY
Glucose-Capillary: 106 mg/dL — ABNORMAL HIGH (ref 70–99)
Glucose-Capillary: 121 mg/dL — ABNORMAL HIGH (ref 70–99)

## 2019-11-10 MED ORDER — SODIUM CHLORIDE 0.9% FLUSH
10.0000 mL | Freq: Two times a day (BID) | INTRAVENOUS | Status: DC
  Administered 2019-11-10 – 2019-11-14 (×7): 10 mL

## 2019-11-10 MED ORDER — CHLORHEXIDINE GLUCONATE CLOTH 2 % EX PADS
6.0000 | MEDICATED_PAD | Freq: Every day | CUTANEOUS | Status: DC
  Administered 2019-11-10: 6 via TOPICAL

## 2019-11-10 MED ORDER — SODIUM CHLORIDE 0.9% FLUSH: 10.0000 mL | INTRAVENOUS | Status: DC | PRN

## 2019-11-10 NOTE — Progress Notes (Addendum)
4327: Chest tube discontinued per order, unable to approximate skin with suture, placed petroleum gauze with dry 4x4 over site, secured with medipore tape. Pt sleeping now after pain medication, educated to cough and deep breath and encouraged IS. Bed rest for 30 minutes. Same day 1v CXR rescheduled for 1130. Arterial line DC with no complications. Pt tolerated well.  Oxygen bumped to 5LPM [place] for SpO2 84 s/p fentanyl dose, pt mouth breather, sleeping easily arouses, aox4. SpO2 90.  Pt request foley to stay in place until afternoon due to history of over active bladder. Plan to ambulate and sit in chair after bedrest.  Bed alarm on. Will continue to monitor.

## 2019-11-10 NOTE — Discharge Summary (Addendum)
Physician Discharge Summary  Patient ID: Christopher Orr MRN: [id number] DOB/AGE: [birth year] 74 y.o.  Admit date: 11/09/2019 Discharge date: 11/14/2019  Admission Diagnoses: Squamous cell carcinoma left lower lobe, Clinical stage IA(T1N0)  Discharge Diagnoses: Squamous cell carcinoma left lower lobe, Clinical and Pathologic stage IA(T1N0)   Active Problems:   Nodule of lower lobe of left lung  Patient Active Problem List   Diagnosis Date Noted  . Nodule of lower lobe of left lung 11/09/2019  . Lung cancer ([place]) 11/01/2019  . Abnormal findings on diagnostic imaging of digestive system   . Polyp of transverse colon   . Prediabetes 09/14/2019  . Kidney lesion 08/15/2019  . History of prostate cancer 08/15/2019  . Arthritis 08/15/2019  . Hernia, inguinal, bilateral 08/15/2019  . History of pancreatitis 08/15/2019  . Cirrhosis of liver ([place]) 08/15/2019  . Insomnia 08/15/2019  . Memory impairment 08/15/2019  . Nodule of middle lobe of right lung 08/15/2019  . Tobacco abuse 08/15/2019  . CAD (coronary artery disease)   . Degenerative disc disease, lumbar 11/01/2018  . Cigarette smoker 08/05/2018  . Acute respiratory failure with hypoxia ([place]) 07/06/2018  . Bilateral inguinal hernia, without obstruction or gangrene, recurrent   . Umbilical hernia without obstruction and without gangrene   . Panic attacks 06/14/2018  . Benzodiazepine dependence ([place]) 06/14/2018  . Post traumatic stress disorder (PTSD) 06/14/2018  . Episode of recurrent major depressive disorder ([place]) 06/14/2018  . Coronary artery disease involving native heart with angina pectoris ([place]) 05/31/2018  . Osteoarthritis of multiple joints 05/31/2018  . Hyperlipidemia 05/31/2018  . Chronic viral hepatitis C ([place]) 09/24/2014  . Chest pain 07/28/2014  . COPD (chronic obstructive pulmonary disease) ([place]) 07/28/2014  . Anxiety 07/28/2014  . Aspiration pneumonia ([place]) 07/28/2014  . Elevated troponin I level 07/28/2014  .  Acute kidney injury ([place]) 07/28/2014  . GERD (gastroesophageal reflux disease) 07/28/2014   HPI:at time of evaluation:   Mr. Cappella is sent for consultation regarding a left lower lobe squamous cell carcinoma  [name] is a 74 year old man with a history of tobacco abuse, COPD, bipolar disorder, early dementia, coronary artery disease, ethanol abuse, treated hepatitis C, gastroesophageal reflux, prostate cancer, hypertension, hyperlipidemia, mitral regurgitation, pancreatitis, posttraumatic stress disorder, and depression.  He smoked about a pack of cigarettes a day for 55 years prior to quitting a month ago.  He had a CT of the abdomen and pelvis in October 2019 which showed a questionable right middle lobe nodule.  A CT of the chest in February 2021 showed that nodule had resolved, but there was a 1 cm left lower lobe nodule that was new.  There also was some scarring in the right base but nothing that appeared suspicious.  He saw Dr. Genevive Bi in consultation.  He did further work-up including pulmonary function testing and a PET/CT.  PET/CT showed the lung nodule was hypermetabolic.  There also was uptake in the left parotid and the anal rectal region.  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  Colonoscopy showed only some benign polyps.  A lung biopsy was positive for squamous cell carcinoma.  Mr. Geurts is a poor historian with some memory loss.  He does have some shortness of breath with exertion.  Activity is significantly limited by left leg pain.  He had a stent placed in 2006.  He is on Plavix for that and bruises easily.  He is not having any chest pain, pressure, or tightness with exertion.  He  has had no change in appetite or weight loss.  He quit smoking a month ago.  Pathology:  SURGICAL PATHOLOGY  CASE: [id number]  PATIENT: [name]  Surgical Pathology Report      Clinical History: squamous cell carcinoma of left lower lobe (cm)      FINAL MICROSCOPIC  DIAGNOSIS:   A. LUNG, LEFT LOWER LOBE, WEDGE RESECTION:  - Squamous cell carcinoma, 1.6 cm.  - Margins not involved.  - Visceral pleura not involved.  - See oncology table.   B. LYMPH NODE, LEVEL 9, EXCISION:  - Anthracotic lymph node with no metastatic carcinoma.   C. LYMPH NODE, LEVEL 9 #2, EXCISION:  - Anthracotic lymph node with no metastatic carcinoma.   D. LYMPH NODE, LEVEL 7, EXCISION:  - Anthracotic lymph node with no metastatic carcinoma.   E. LYMPH NODE, LEVEL 10, EXCISION:  - Anthracotic lymph node with no metastatic carcinoma.   F. LYMPH NODE, LEVEL 5, EXCISION:  - Anthracotic lymph node with no metastatic carcinoma.   G. LUNG, LEFT LOWER LOBE #2, WEDGE RESECTION:  - Lung lobe with previous wedge site.  - No residual carcinoma.  - Final margins clear.   ONCOLOGY TABLE:  LUNG:Resection  Procedure: Left lower wedge, lymph node biopsies and the left lower  lobectomy.  Specimen Laterality: Left.  Tumor Site: Left lower lobe.  Tumor Size: 1.6 x 1.5 cm.  Tumor Focality: Unifocal.  Histologic Type: Squamous cell carcinoma.  Visceral Pleura Invasion: Not present.  Lymphovascular Invasion: Not identified.  Direct Invasion of Adjacent Structures: No adjacent structures  submitted.  Margins: Free of tumor.  Treatment Effect: No known presurgical therapy.  Regional Lymph Nodes:    Number of Lymph Nodes Involved: 0    Number of Lymph Nodes Examined: 5  Pathologic Stage Classification (pTNM, AJCC 8th Edition): pT1b, pN0  Ancillary Studies: Can be performed if requested.  Representative Tumor Block: A2 and A3.  Comment(s): Immunohistochemistry show the tumor is positive with p40,  p63, cytokeratin 5/6 and is negative with TTF-1 and Napsin-A consistent  with squamous cell carcinoma. (v4.1.0.1)   INTRAOPERATIVE DIAGNOSIS:  A. Left lower lobe lung wedge-freeze margins only: "Negative for  squamous cell carcinoma"  Intraoperative diagnosis rendered by Dr.  Jeannie Done at 11:10 AM on  11/09/2019.   GROSS DESCRIPTION:  A: The specimen is received fresh for rapid intraoperative consultation  and consists of a 5.9 x 3.0 x 2.3 cm, 15 g wedge of lung parenchyma with  multiple staple lines. The serosal surface is red-purple with moderate  anthracosis. A 1.6 x 1.5 cm tan-gray, firm stellate area is identified  on the pleural surface, and is inked black. The closest resection  margin is submitted for frozen section analysis, the specimen is  serially sectioned to reveal a 1.8 x 1.6 x 1.1 cm tan-gray, firm,  ill-defined lesion. Lesion abuts the inked pleural surface, measures  approximately 1.2 cm from the stapled resection margin. The uninvolved  parenchyma is spongy red-brown. Representative sections are submitted  in 4 cassettes.  1 = margin frozen section remnant  2 and 3 = representative sections of lesion  4 = uninvolved parenchyma  B: The specimen is received in saline and consists of a 0.6 cm tan-gray  possible lymph node. The specimen is entirely submitted in 1 cassette.  C: The specimen is received in saline and consists of a 1.5 x 0.6 x 0.3  cm aggregate of tan-gray, anthracotic soft tissue. The specimen is  entirely submitted in 1  cassette.  D: The specimen is received in saline and consists of a 1.5 x 0.7 x 0.3  cm tan-gray, anthracotic possible lymph node. The specimen is entirely  submitted in 1 cassette.  E: The specimen is received in saline and consists of a 1.0 x 0.8 0.4 cm  aggregate of tan-gray, anthracotic soft tissue. The specimen is  entirely submitted in 1 cassette.  F: The specimen is received fresh and consists of a 1.9 x 1.4 x 0.3 cm  aggregate of tan-gray, anthracotic soft tissue and fat. The specimen is  entirely submitted in 1 cassette.  G: The specimen is received fresh and consists of a 9.2 x 5.1 x 1.9 cm,  28 g wedge of lung parenchyma with multiple staple lines. The pleural  surface is red to  gray-purple with moderate f. There is a 1.7 x 1.5 cm  tan-gray, focally disrupted area noted on the pleural surface. The area  surrounding the defect is inked black, the specimen is serially  sectioned to reveal spongy red-brown parenchyma. No lesions are grossly  identified. Representative sections are submitted in 3 cassettes, to  include the pleural surface defect. Craig Staggers 11/11/2019)   Final Diagnosis performed by Claudette Laws, MD.  Electronically signed  11/11/2019  Technical component performed at [employer]. [facility], [place] [address] [place], [place] [address].  Professional component performed at [facility],  [place] [address], [place], [place] [address].  Immunohistochemistry Technical component (if applicable) was performed  at [facility]. [address], [place],  [place], [place] [address].  IMMUNOHISTOCHEMISTRY DISCLAIMER (if applicable):  Some of these immunohistochemical stains may have been developed and the  performance characteristics determine by [facility]. Some  may not have been cleared or approved by the U.S. Food and Drug  Administration. The FDA has determined that such clearance or approval  is not necessary. This test is used for clinical purposes. It should not  be regarded as investigational or for research. This laboratory is  certified under the [place]  (CLIA-88) as qualified to perform high complexity clinical laboratory  testing. The controls stained appropriately.  Discharged Condition: good  Hospital Course: The patient was admitted electively and on 11/09/2019 taken to the operating room where he underwent the below described procedure.  He tolerated it well and was taken to the postanesthesia care unit in stable condition.  Postoperative hospital course:  The patient has done very well.  Chest tube was removed on postoperative day #1.  He has  maintained stable hemodynamics but did have transient atrial fibrillation which ended prior to initiating amiodarone protocol.ASA and Plavix have been resumed  He does have a mild expected acute blood loss anemia.  Most recent hemoglobin and hematocrit are 9.6/30 and they have stabilized.  Renal function has remained in the normal range.  Incisions are noted to be healing well without evidence of infection.  Oxygen has been weaned and he maintains good saturations  1-2 liters and home O2 has been arranged. Marland Kitchen  He is tolerating gradually increasing activities using standard protocols.  At the time of discharge the patient is felt to be quite stable. A walker has been arranged to help with mobility.  Please see above for pathology report  Consults: None  Significant Diagnostic Studies: routine post-op labs and serial CXR's  Treatments: surgery:  OPERATIVE REPORT  DATE OF PROCEDURE:  11/09/2019  PREOPERATIVE DIAGNOSIS:  Squamous cell carcinoma, left  lower lobe, clinical stage IA (T1, N0).  POSTOPERATIVE DIAGNOSIS:  Squamous cell carcinoma, left lower lobe, clinical stage IA (T1, N0).  PROCEDURES:   1.  Xi robotic left lower lobe wedge resection.  2.  Lymph node sampling. 3.  Intercostal nerve blocks at levels 3 through 10.  SURGEON:  Modesto Charon, MD  ASSISTANT:  Lars Pinks, PA-C  ANESTHESIA:  General.   Discharge Exam: Blood pressure 105/68, pulse 79, temperature 98.9 F (37.2 C), temperature source Oral, resp. rate 18, weight 114.8 kg, SpO2 90 %.  General appearance: alert, cooperative and no distress Heart: regular rate and rhythm Lungs: dim left base Abdomen: + BS, non tender or distended Extremities: no edema Wound: oncis healing well   Disposition: Discharge disposition: 01-Home or Self Care       Discharge Instructions    Discharge patient   Complete by: As directed    Discharge disposition: 01-Home or Self Care   Discharge patient date:  11/14/2019     Allergies as of 11/14/2019   No Known Allergies     Medication List    TAKE these medications   albuterol 108 (90 Base) MCG/ACT inhaler Commonly known as: VENTOLIN HFA Inhale 1-2 puffs into the lungs every 6 (six) hours as needed for wheezing or shortness of breath.   albuterol (2.5 MG/3ML) 0.083% nebulizer solution Commonly known as: PROVENTIL Take 3 mLs (2.5 mg total) by nebulization every 4 (four) hours as needed for wheezing or shortness of breath.   ALPRAZolam 1 MG tablet Commonly known as: XANAX Take 1 tablet (1 mg total) by mouth as directed. 1/2 in am 1 pill qhs What changed: Another medication with the same name was removed. Continue taking this medication, and follow the directions you see here.   ARIPiprazole 10 MG tablet Commonly known as: ABILIFY Take 10 mg by mouth daily.   aspirin EC 81 MG tablet Take 81 mg by mouth daily.   budesonide-formoterol 160-4.5 MCG/ACT inhaler Commonly known as: SYMBICORT Inhale 2 puffs into the lungs 2 (two) times daily. Rinse mouth out   celecoxib 200 MG capsule Commonly known as: CELEBREX Take 1 capsule (200 mg total) by mouth daily after breakfast.   clopidogrel 75 MG tablet Commonly known as: PLAVIX Take 1 tablet (75 mg total) by mouth daily. Reported on 01/15/2016   donepezil 5 MG tablet Commonly known as: Aricept Take 1 tablet (5 mg total) by mouth at bedtime.   FLUoxetine 20 MG capsule Commonly known as: PROZAC Take 60 mg by mouth daily.   folic acid 1 MG tablet Commonly known as: FOLVITE TAKE 1 TABLET BY MOUTH EVERY DAY   furosemide 40 MG tablet Commonly known as: LASIX Take 1 tablet (40 mg total) by mouth daily. Start taking on: November 15, 2019   memantine 5 MG tablet Commonly known as: NAMENDA Take 1 tablet (5 mg total) by mouth 2 (two) times daily. Taking qd   metoprolol succinate 25 MG 24 hr tablet Commonly known as: TOPROL-XL Take 1 tablet (25 mg total) by mouth daily.   multivitamin  with minerals tablet Take 1 tablet by mouth daily.   Myrbetriq 50 MG Tb24 tablet Generic drug: mirabegron ER TAKE 1 TABLET BY MOUTH EVERY DAY What changed: how much to take   oxyCODONE 5 MG immediate release tablet Commonly known as: Oxy IR/ROXICODONE Take 1 tablet (5 mg total) by mouth every 6 (six) hours as needed for up to 7 days for moderate pain.   pantoprazole 40 MG  tablet Commonly known as: PROTONIX Take 1 tablet (40 mg total) by mouth daily. 30 min before breakfast   Potassium Chloride ER 20 MEQ Tbcr Take 20 mEq by mouth daily. Start taking on: November 15, 2019   rosuvastatin 10 MG tablet Commonly known as: CRESTOR Take 1 tablet (10 mg total) by mouth at bedtime. What changed: when to take this   tamsulosin 0.4 MG Caps capsule Commonly known as: FLOMAX Take 1 capsule (0.4 mg total) by mouth daily after supper. What changed: when to take this   traZODone 150 MG tablet Commonly known as: DESYREL Take 1 tablet (150 mg total) by mouth at bedtime.            Durable Medical Equipment  (From admission, onward)         Start     Ordered   11/14/19 1302  For home use only DME Walker rolling  Once    Question Answer Comment  Walker: With [place] Wheels   Patient needs a walker to treat with the following condition Weakness      11/14/19 1302   11/13/19 0827  For home use only DME oxygen  Once    Question Answer Comment  Length of Need 6 Months   Mode or (Route) Nasal cannula   Liters per Minute 3   Frequency Continuous (stationary and portable oxygen unit needed)   Oxygen delivery system Gas      11/13/19 0826         Follow-up Information    Melrose Nakayama, MD Follow up.   Specialty: Cardiothoracic Surgery Why: Please see discharge paperwork for follow-up appointment with your surgeon.  Also obtain a chest x-ray 1/2-hour prior to this appointment at [facility].  It is located in the same office complex on the first floor. Contact  information: [address] [place] [address] [phone]        [facility] Follow up.   Specialty: Cardiothoracic Surgery Why: Please see discharge paperwork for follow-up appointment with the office nurse for suture removal. Contact information: [place], [place] [phone]          Signed: John Giovanni PA-C 11/14/2019, 1:51 PM

## 2019-11-10 NOTE — Plan of Care (Signed)

## 2019-11-10 NOTE — Progress Notes (Addendum)
[address]       [place] [address]             [phone]      1 Day Post-Op Procedure(s) (LRB): XI ROBOTIC ASSISTED THORACOSCOPY-WEDGE RESECTION LEFT LOWER LOBE (Left) Intercostal Nerve Block (Left) Subjective: Feels reasonably well, no specific c/o except mild SOB  Objective: Vital signs in last 24 hours: Temp:  [97 F (36.1 C)-99.1 F (37.3 C)] 98 F (36.7 C) (04/15 0727) Pulse Rate:  [79-90] 82 (04/15 0727) Cardiac Rhythm: Normal sinus rhythm (04/15 0420) Resp:  [10-20] 14 (04/15 0727) BP: (100-145)/(51-93) 110/65 (04/15 0727) SpO2:  [84 %-97 %] 93 % (04/15 0727) Arterial Line BP: (85-150)/(29-82) 150/29 (04/15 0435)  Hemodynamic parameters for last 24 hours:    Intake/Output from previous day: 04/14 0701 - 04/15 0700 In: 2471.9 [P.O.:120; I.V.:2001.9] Out: 3845 [Urine:1120; Blood:75; Chest Tube:94] Intake/Output this shift: No intake/output data recorded.  General appearance: alert, cooperative, fatigued and no distress Heart: regular rate and rhythm Lungs: mildly dim in bases Abdomen: benign Extremities: no edema Wound: incis healing well  Lab Results: Recent Labs    11/07/19 0959 11/10/19 0444  WBC 6.5 11.1*  HGB 12.8* 10.8*  HCT 39.5 33.3*  PLT 230 208   BMET:  Recent Labs    11/07/19 0959 11/10/19 0444  NA 139 140  K 4.6 4.4  CL 108 111  CO2 20* 23  GLUCOSE 100* 130*  BUN 16 17  CREATININE 1.17 1.23  CALCIUM 8.6* 8.2*    PT/INR:  Recent Labs    11/07/19 0959  LABPROT 12.9  INR 1.0   ABG    Component Value Date/Time   PHART 7.377 11/07/2019 1016   HCO3 24.0 11/07/2019 1016   TCO2 23 07/27/2014 0903   ACIDBASEDEF 0.5 11/07/2019 1016   O2SAT 95.1 11/07/2019 1016   CBG (last 3)  Recent Labs    11/09/19 1755 11/10/19 0019 11/10/19 0605  GLUCAP 125* 121* 106*    Meds Scheduled Meds: . acetaminophen  1,000 mg Oral Q6H   Or  . acetaminophen (TYLENOL) oral liquid 160 mg/5 mL  1,000 mg Oral Q6H   . ALPRAZolam  1 mg Oral Daily   And  . ALPRAZolam  2 mg Oral QHS  . ARIPiprazole  10 mg Oral Daily  . aspirin EC  81 mg Oral Daily  . bisacodyl  10 mg Oral Daily  . Chlorhexidine Gluconate Cloth  6 each Topical Daily  . [START ON 11/11/2019] clopidogrel  75 mg Oral Daily  . donepezil  5 mg Oral QHS  . enoxaparin (LOVENOX) injection  40 mg Subcutaneous Q24H  . FLUoxetine  60 mg Oral Daily  . folic acid  1 mg Oral Daily  . ketorolac  15 mg Intravenous Q6H  . memantine  5 mg Oral BID  . metoprolol succinate  25 mg Oral Daily  . mirabegron ER  50 mg Oral Daily  . pantoprazole  40 mg Oral Daily  . rosuvastatin  10 mg Oral QHS  . senna-docusate  1 tablet Oral QHS  . tamsulosin  0.4 mg Oral QPC supper  . traZODone  150 mg Oral QHS   Continuous Infusions: . sodium chloride    . sodium chloride 100 mL/hr at 11/10/19 0400   PRN Meds:.Place/Maintain arterial line **AND** sodium chloride, albuterol, fentaNYL (SUBLIMAZE) injection, ondansetron (ZOFRAN) IV, oxyCODONE, traMADol  Xrays DG Chest Port 1 View  Result Date: 11/09/2019 CLINICAL DATA:  74 year old  male status post lung surgery with chest tube. Postoperative day zero left lower lobe wedge resection. EXAM: PORTABLE CHEST 1 VIEW COMPARISON:  Two-view chest radiographs 11/07/2019 and earlier. FINDINGS: Portable AP semi upright view at 1243 hours. Lower lung volumes. Left chest tube in place with no pneumothorax identified. Left chest wall and lower neck subcutaneous gas. No confluent opacity in the residual left lung. However there is new consolidation in the lateral aspect of the right upper lobe abutting the minor fissure. A right IJ central line is in place with tip at the SVC level. No right pneumothorax. Mediastinal contours appear stable. IMPRESSION: 1. New consolidation in the lateral right upper lobe, nonspecific but consider aspiration in this setting. 2. Postoperative changes to the left lung with chest tube in place and no adverse  features. 3. Right IJ central line, tip at the SVC level.  No pneumothorax. Electronically Signed   By: Genevie Ann M.D.   On: 11/09/2019 14:40    Assessment/Plan: S/P Procedure(s) (LRB): XI ROBOTIC ASSISTED THORACOSCOPY-WEDGE RESECTION LEFT LOWER LOBE (Left) Intercostal Nerve Block (Left)  1 doing well POD#1 2 stable hemodynamics in sinus rhythm 3 sats ok on 4 liters- push pulm toilet , increase activity level 4 CT- minor drainage, no air leak- poss d/c tube later today 5 creat 1.23- monitor on toradol 6 minor ABL anemia- monitor clinically 7 BS well controlled, A1C 6.2    LOS: 1 day    John Giovanni PA-C Pager [phone] 11/10/2019 Patient seen and examined, agree with above Dc chest tube SCD + enoxaparin + ambulation  Aralyn Nowak C. Roxan Hockey, MD [facility] [phone]

## 2019-11-10 NOTE — Progress Notes (Signed)
Patient out of bed in chair, O2 weaned off with patient O2 at 93%.

## 2019-11-10 NOTE — Discharge Instructions (Signed)
Robot-Assisted Thoracic Surgery, Care After This sheet gives you information about how to care for yourself after your procedure. Your health care provider may also give you more specific instructions. If you have problems or questions, contact your health care provider. What can I expect after the procedure? After the procedure, it is common to have:  Some pain and aches in the area of your surgical cuts (incisions).  Pain when breathing in (inhaling) and coughing.  Tiredness (fatigue).  Trouble sleeping.  Constipation. Follow these instructions at home: Medicines  Take over-the-counter and prescription medicines only as told by your health care provider.  If you were prescribed an antibiotic medicine, take it as told by your health care provider. Do not stop taking the antibiotic even if you start to feel better.  Talk with your health care provider about safe and effective ways to manage pain after your procedure. Pain management should fit your specific health needs.  Take prescription pain medicine before pain becomes severe. Relieving and controlling your pain will make breathing easier for you. Activity  Return to your normal activities as told by your health care provider. Ask your health care provider what activities are safe for you.  Do not lift anything that is heavier than 10 lb (4.5 kg), or the limit that you are told, until your health care provider says that it is safe.  Avoid sitting for a long time without moving. Get up and move around one or more times every few hours. Bathing  Do not take baths, swim, or use a hot tub until your health care provider approves. You may take showers. Incision care  Follow instructions from your health care provider about how to take care of your incision(s). Make sure you: ? Wash your hands with soap and water before you change your bandage (dressing). If soap and water are not available, use hand sanitizer. ? Change your  dressing as told by your health care provider. ? Leave stitches (sutures), skin glue, or adhesive strips in place. These skin closures may need to stay in place for 2 weeks or longer. If adhesive strip edges start to loosen and curl up, you may trim the loose edges. Do not remove adhesive strips completely unless your health care provider tells you to do that.  Check your incision area every day for signs of infection. Check for: ? Redness, swelling, or pain. ? Fluid or blood. ? Warmth. ? Pus or a bad smell. Driving  Ask your health care provider when it is safe for you to drive.  Do not drive or use heavy machinery while taking prescription pain medicine. Eating and drinking  Follow instructions from your health care provider about eating or drinking restrictions. These will vary depending on what procedure you had. Your health care provider may recommend: ? A liquid diet or soft diet for the first few days. ? Meals that are smaller and more frequent. ? A diet of fruits, vegetables, whole grains, and low-fat proteins. ? Limiting foods that are high in processed sugar and fat, including fried and sweet foods. Pneumonia prevention   Do not use any products that contain nicotine or tobacco, such as cigarettes and e-cigarettes. If you need help quitting, ask your health care provider.  Avoid secondhand smoke.  Do deep breathing exercises and cough regularly as directed. This helps to clear mucus and prevent pneumonia. If it hurts to cough, try one of these methods to ease your pain when you cough: ? Hold a  pillow against your chest. ? Place the palms of both hands over your incisions (use splinting).  Use an incentive spirometer as directed. This device measures how much air your lungs are getting with each breath. Using this will improve your breathing.  Do pulmonary rehabilitation as directed. This is a program that includes exercise, education, and support. General  instructions  Wear compression stockings as told by your health care provider. These stockings help to prevent blood clots and reduce swelling in your legs.  If you have a drainage tube: ? Follow instructions from your health care provider about how to take care of it. ? Do not travel by airplane after your tube is removed until your health care provider tells you it is safe.  To prevent or treat constipation while you are taking prescription pain medicine, your health care provider may recommend that you: ? Drink enough fluid to keep your urine pale yellow. ? Take over-the-counter or prescription medicines. ? Eat foods that are high in fiber, such as fresh fruits and vegetables, whole grains, and beans. ? Limit foods that are high in fat and processed sugars, such as fried and sweet foods.  Keep all follow-up visits as told by your health care provider. This is important. Contact a health care provider if:  You have redness, swelling, or pain around an incision.  You have fluid or blood coming from an incision.  An incision feels warm to the touch.  You have pus or a bad smell coming from an incision.  You have a fever.  You cannot eat or drink without vomiting.  Your prescription pain medicine is not controlling your pain. Get help right away if:  You have chest pain.  Your heart is beating quickly.  You have trouble breathing.  You have trouble speaking.  You are confused.  You feel weak or dizzy, or you faint. These symptoms may represent a serious problem that is an emergency. Do not wait to see if the symptoms will go away. Get medical help right away. Call your local emergency services (911 in the [place] Do not drive yourself to the hospital. Summary  Talk with your health care provider about safe and effective ways to manage pain after your procedure. Pain management should fit your specific health needs.  Return to your normal activities as told by your health  care provider. Ask your health care provider what activities are safe for you.  Do deep breathing exercises and cough regularly as directed. This helps to clear mucus and prevent pneumonia. If it hurts to cough, ease pain by holding a pillow against your chest or by placing the palms of both hands over your incisions (splinting). This information is not intended to replace advice given to you by your health care provider. Make sure you discuss any questions you have with your health care provider. Document Revised: 05/13/2019 Document Reviewed: 11/17/2016 Elsevier Patient Education  [place].

## 2019-11-11 ENCOUNTER — Inpatient Hospital Stay (HOSPITAL_COMMUNITY): Payer: Medicare HMO

## 2019-11-11 LAB — COMPREHENSIVE METABOLIC PANEL
ALT: 12 U/L (ref 0–44)
AST: 22 U/L (ref 15–41)
Albumin: 3 g/dL — ABNORMAL LOW (ref 3.5–5.0)
Alkaline Phosphatase: 53 U/L (ref 38–126)
Anion gap: 8 (ref 5–15)
BUN: 22 mg/dL (ref 8–23)
CO2: 24 mmol/L (ref 22–32)
Calcium: 8.1 mg/dL — ABNORMAL LOW (ref 8.9–10.3)
Chloride: 105 mmol/L (ref 98–111)
Creatinine, Ser: 1.33 mg/dL — ABNORMAL HIGH (ref 0.61–1.24)
GFR calc Af Amer: >60 mL/min (ref >60)
GFR calc non Af Amer: 53 mL/min — ABNORMAL LOW (ref >60)
Glucose, Bld: 99 mg/dL (ref 70–99)
Potassium: 4.1 mmol/L (ref 3.5–5.1)
Sodium: 137 mmol/L (ref 135–145)
Total Bilirubin: 0.6 mg/dL (ref 0.3–1.2)
Total Protein: 5.5 g/dL — ABNORMAL LOW (ref 6.5–8.1)

## 2019-11-11 LAB — CBC
HCT: 30.1 % — ABNORMAL LOW (ref 39.0–52.0)
Hemoglobin: 9.6 g/dL — ABNORMAL LOW (ref 13.0–17.0)
MCH: 33.3 pg (ref 26.0–34.0)
MCHC: 31.9 g/dL (ref 30.0–36.0)
MCV: 104.5 fL — ABNORMAL HIGH (ref 80.0–100.0)
Platelets: 171 10*3/uL (ref 150–400)
RBC: 2.88 MIL/uL — ABNORMAL LOW (ref 4.22–5.81)
RDW: 14.4 % (ref 11.5–15.5)
WBC: 9 10*3/uL (ref 4.0–10.5)
nRBC: 0 % (ref 0.0–0.2)

## 2019-11-11 LAB — PHOSPHORUS: Phosphorus: 1.8 mg/dL — ABNORMAL LOW (ref 2.5–4.6)

## 2019-11-11 LAB — MAGNESIUM: Magnesium: 2 mg/dL (ref 1.7–2.4)

## 2019-11-11 LAB — SURGICAL PATHOLOGY

## 2019-11-11 MED ORDER — LORAZEPAM 1 MG PO TABS: 1.0000 mg | ORAL_TABLET | ORAL | Status: AC | PRN

## 2019-11-11 MED ORDER — AMIODARONE HCL IN DEXTROSE 360-4.14 MG/200ML-% IV SOLN: 60.0000 mg/h | INTRAVENOUS | Status: DC

## 2019-11-11 MED ORDER — LORAZEPAM 2 MG/ML IJ SOLN: 1.0000 mg | INTRAMUSCULAR | Status: AC | PRN

## 2019-11-11 MED ORDER — LEVALBUTEROL HCL 0.63 MG/3ML IN NEBU
0.6300 mg | INHALATION_SOLUTION | Freq: Three times a day (TID) | RESPIRATORY_TRACT | Status: DC
  Administered 2019-11-11 (×3): 0.63 mg via RESPIRATORY_TRACT
  Filled 2019-11-11 (×3): qty 3

## 2019-11-11 MED ORDER — ADULT MULTIVITAMIN W/MINERALS CH
1.0000 | ORAL_TABLET | Freq: Every day | ORAL | Status: DC
  Administered 2019-11-12 – 2019-11-14 (×3): 1 via ORAL
  Filled 2019-11-11 (×3): qty 1

## 2019-11-11 MED ORDER — CELECOXIB 200 MG PO CAPS
200.0000 mg | ORAL_CAPSULE | Freq: Every day | ORAL | Status: DC
  Administered 2019-11-12 – 2019-11-14 (×3): 200 mg via ORAL
  Filled 2019-11-11 (×4): qty 1

## 2019-11-11 MED ORDER — LORAZEPAM 2 MG/ML IJ SOLN: 0.0000 mg | INTRAMUSCULAR | Status: AC

## 2019-11-11 MED ORDER — AMIODARONE HCL IN DEXTROSE 360-4.14 MG/200ML-% IV SOLN: 30.0000 mg/h | INTRAVENOUS | Status: DC

## 2019-11-11 MED ORDER — OXYCODONE HCL 5 MG PO TABS
5.0000 mg | ORAL_TABLET | ORAL | Status: DC | PRN
  Administered 2019-11-11 – 2019-11-12 (×5): 10 mg via ORAL
  Administered 2019-11-13: 5 mg via ORAL
  Administered 2019-11-14: 10 mg via ORAL
  Administered 2019-11-14: 5 mg via ORAL
  Filled 2019-11-11: qty 1
  Filled 2019-11-11 (×5): qty 2
  Filled 2019-11-11: qty 1
  Filled 2019-11-11: qty 2

## 2019-11-11 MED ORDER — AMIODARONE HCL IN DEXTROSE 360-4.14 MG/200ML-% IV SOLN
INTRAVENOUS | Status: AC
  Filled 2019-11-11: qty 200

## 2019-11-11 MED ORDER — THIAMINE HCL 100 MG PO TABS
100.0000 mg | ORAL_TABLET | Freq: Every day | ORAL | Status: DC
  Administered 2019-11-11 – 2019-11-14 (×4): 100 mg via ORAL
  Filled 2019-11-11 (×4): qty 1

## 2019-11-11 MED ORDER — THIAMINE HCL 100 MG/ML IJ SOLN: 100.0000 mg | Freq: Every day | INTRAMUSCULAR | Status: DC

## 2019-11-11 MED ORDER — AMIODARONE LOAD VIA INFUSION
150.0000 mg | Freq: Once | INTRAVENOUS | Status: DC
  Filled 2019-11-11: qty 83.34

## 2019-11-11 MED ORDER — LORAZEPAM 2 MG/ML IJ SOLN
0.0000 mg | Freq: Three times a day (TID) | INTRAMUSCULAR | Status: DC
  Administered 2019-11-13: 2 mg via INTRAVENOUS
  Filled 2019-11-11: qty 1

## 2019-11-11 MED ORDER — LEVALBUTEROL HCL 0.63 MG/3ML IN NEBU
0.6300 mg | INHALATION_SOLUTION | Freq: Three times a day (TID) | RESPIRATORY_TRACT | Status: DC | PRN
  Administered 2019-11-11: 0.63 mg via RESPIRATORY_TRACT
  Filled 2019-11-11: qty 3

## 2019-11-11 MED ORDER — FOLIC ACID 1 MG PO TABS
1.0000 mg | ORAL_TABLET | Freq: Every day | ORAL | Status: DC
  Administered 2019-11-11 – 2019-11-14 (×4): 1 mg via ORAL
  Filled 2019-11-11 (×4): qty 1

## 2019-11-11 NOTE — Progress Notes (Signed)
Approximately 0206 patient went into Afib HR140. EKG done and MD notified order to start Amiodarone gtt. Soon after speaking with MD patient converted to NSR at 0249. Amiodarone gtt not started since he converted. Patient has required oxygen tonight 5LNC had RT to come to bedside for breathing treatment Xopenex was given. I will continue to monitor.

## 2019-11-11 NOTE — Progress Notes (Signed)
Ambulated patient in hall approximately 150 Ft with assistance and required [facility] Patient encouraged to use I.S. he returned demonstration and was able to get 1200. Patient tired and wanted to go to sleep after walk. I had to place him on HFNC 8L. I will continue to monitor.

## 2019-11-11 NOTE — Progress Notes (Signed)
2 Days Post-Op Procedure(s) (LRB): XI ROBOTIC ASSISTED THORACOSCOPY-WEDGE RESECTION LEFT LOWER LOBE (Left) Intercostal Nerve Block (Left) Subjective: C/o incisional pain  Objective: Vital signs in last 24 hours: Temp:  [97.8 F (36.6 C)-98.6 F (37 C)] 97.8 F (36.6 C) (04/16 0315) Pulse Rate:  [75-93] 78 (04/16 0600) Cardiac Rhythm: Normal sinus rhythm (04/16 0700) Resp:  [11-23] 11 (04/16 0600) BP: (85-130)/(55-88) 93/58 (04/16 0600) SpO2:  [85 %-96 %] 95 % (04/16 0600)  Hemodynamic parameters for last 24 hours:    Intake/Output from previous day: 04/15 0701 - 04/16 0700 In: 550 [P.O.:550] Out: 345 [Urine:275; Chest Tube:70] Intake/Output this shift: No intake/output data recorded.  General appearance: alert, cooperative and no distress Neurologic: intact Heart: regular rate and rhythm Lungs: diminished breath sounds bibasilar Abdomen: normal findings: soft, non-tender  Lab Results: Recent Labs    11/10/19 0444 11/11/19 0615  WBC 11.1* 9.0  HGB 10.8* 9.6*  HCT 33.3* 30.1*  PLT 208 171   BMET:  Recent Labs    11/10/19 0444 11/11/19 0615  NA 140 137  K 4.4 4.1  CL 111 105  CO2 23 24  GLUCOSE 130* 99  BUN 17 22  CREATININE 1.23 1.33*  CALCIUM 8.2* 8.1*    PT/INR: No results for input(s): LABPROT, INR in the last 72 hours. ABG    Component Value Date/Time   PHART 7.377 11/07/2019 1016   HCO3 24.0 11/07/2019 1016   TCO2 23 07/27/2014 0903   ACIDBASEDEF 0.5 11/07/2019 1016   O2SAT 95.1 11/07/2019 1016   CBG (last 3)  Recent Labs    11/09/19 1755 11/10/19 0019 11/10/19 0605  GLUCAP 125* 121* 106*    Assessment/Plan: S/P Procedure(s) (LRB): XI ROBOTIC ASSISTED THORACOSCOPY-WEDGE RESECTION LEFT LOWER LOBE (Left) Intercostal Nerve Block (Left) -POD # 2 wedge resection CV- transient atrial fib- converted to SR before amiodarone started  Continue ASA, metoprolol  Resume plavix RESP- atelectasis- only pulling about 750 on IS  Continue IS,  change albuterol to Xopenex, add flutter valve RENAL- creatinine up slightly, stop enoxaparin, monitor ENDO- CBG well controlled Anemia secondary to ABL- Hgb 30 SCD + enoxaparin Will dc tramadol, increase oxycodone for pain increase ambulation  LOS: 2 days    Melrose Nakayama 11/11/2019

## 2019-11-12 ENCOUNTER — Inpatient Hospital Stay (HOSPITAL_COMMUNITY): Payer: Medicare HMO

## 2019-11-12 LAB — BASIC METABOLIC PANEL
Anion gap: 6 (ref 5–15)
BUN: 16 mg/dL (ref 8–23)
CO2: 25 mmol/L (ref 22–32)
Calcium: 8.3 mg/dL — ABNORMAL LOW (ref 8.9–10.3)
Chloride: 109 mmol/L (ref 98–111)
Creatinine, Ser: 1.22 mg/dL (ref 0.61–1.24)
GFR calc Af Amer: >60 mL/min (ref >60)
GFR calc non Af Amer: 58 mL/min — ABNORMAL LOW (ref >60)
Glucose, Bld: 106 mg/dL — ABNORMAL HIGH (ref 70–99)
Potassium: 3.9 mmol/L (ref 3.5–5.1)
Sodium: 140 mmol/L (ref 135–145)

## 2019-11-12 MED ORDER — SPIRITUS FRUMENTI
1.0000 | Freq: Two times a day (BID) | ORAL | Status: DC
  Filled 2019-11-12 (×7): qty 1

## 2019-11-12 MED ORDER — LEVALBUTEROL HCL 0.63 MG/3ML IN NEBU
0.6300 mg | INHALATION_SOLUTION | Freq: Two times a day (BID) | RESPIRATORY_TRACT | Status: DC
  Administered 2019-11-12 – 2019-11-14 (×5): 0.63 mg via RESPIRATORY_TRACT
  Filled 2019-11-12 (×5): qty 3

## 2019-11-12 MED ORDER — SORBITOL 70 % SOLN
60.0000 mL | Freq: Once | Status: AC
  Administered 2019-11-13: 60 mL via ORAL
  Filled 2019-11-12: qty 60

## 2019-11-12 NOTE — Progress Notes (Addendum)
      [address]       [place] [address]             [phone]      3 Days Post-Op Procedure(s) (LRB): XI ROBOTIC ASSISTED THORACOSCOPY-WEDGE RESECTION LEFT LOWER LOBE (Left) Intercostal Nerve Block (Left)   Subjective:  Patient not feeling very well.  States he is nauseated and sweaty.  He states he drinks 1/5th of bourbon per day and is likely withdrawaling.  + ambulation  Objective: Vital signs in last 24 hours: Temp:  [98 F (36.7 C)-99.3 F (37.4 C)] 98 F (36.7 C) (04/17 0427) Pulse Rate:  [72-95] 95 (04/17 0713) Cardiac Rhythm: Normal sinus rhythm (04/17 0700) Resp:  [12-19] 12 (04/17 0713) BP: (103-124)/(61-74) 116/74 (04/17 0600) SpO2:  [92 %-97 %] 97 % (04/17 0713)  Intake/Output from previous day: 04/16 0701 - 04/17 0700 In: 780 [P.O.:780] Out: 1725 [Urine:1725]  General appearance: alert, cooperative and diaphoretic mild Heart: regular rate and rhythm and mild tachycardia Lungs: diminished breath sounds left base Abdomen: soft, non-tender; bowel sounds normal; no masses,  no organomegaly Extremities: extremities normal, atraumatic, no cyanosis or edema Wound: clean and dry  Lab Results: Recent Labs    11/10/19 0444 11/11/19 0615  WBC 11.1* 9.0  HGB 10.8* 9.6*  HCT 33.3* 30.1*  PLT 208 171   BMET:  Recent Labs    11/11/19 0615 11/12/19 0550  NA 137 140  K 4.1 3.9  CL 105 109  CO2 24 25  GLUCOSE 99 106*  BUN 22 16  CREATININE 1.33* 1.22  CALCIUM 8.1* 8.3*    PT/INR: No results for input(s): LABPROT, INR in the last 72 hours. ABG    Component Value Date/Time   PHART 7.377 11/07/2019 1016   HCO3 24.0 11/07/2019 1016   TCO2 23 07/27/2014 0903   ACIDBASEDEF 0.5 11/07/2019 1016   O2SAT 95.1 11/07/2019 1016   CBG (last 3)  Recent Labs    11/09/19 1755 11/10/19 0019 11/10/19 0605  GLUCAP 125* 121* 106*    Assessment/Plan: S/P Procedure(s) (LRB): XI ROBOTIC ASSISTED THORACOSCOPY-WEDGE RESECTION LEFT LOWER  LOBE (Left) Intercostal Nerve Block (Left)  1. CV- hemodynamically stable in NSR- continue ASA, Plavix, Lopressor 2. Pulm- + atelectasis,  Requiring 6-8 L with ambulation, currently on 5L at rest with sats in the mid 90s, continue aggressive pulmonary toilet 3. Ethanol abuse-- patient drinks 1/5th of bourbon per day.Marland Kitchen CIWA protocol ordered, will add 2 cocktails while in the hospital to aide with patients symptoms 4. Lovenox for DVT prophylaxis 5. Dispo- patient stable, need to work on pulmonary status, monitor withdrawal symptoms closely continue current care   LOS: 3 days    [place]wood Handler, PA-C  11/12/2019  Weaning O2[ 6 L] Treating EtOH withdrawal nsr with brief run afib- cont  beta blocker prob home Mon on 2 L O2  patient examined and medical record reviewed,agree with above note. Tharon Aquas Trigt III 11/12/2019

## 2019-11-13 ENCOUNTER — Inpatient Hospital Stay (HOSPITAL_COMMUNITY): Payer: Medicare HMO

## 2019-11-13 LAB — BASIC METABOLIC PANEL
Anion gap: 6 (ref 5–15)
BUN: 15 mg/dL (ref 8–23)
CO2: 25 mmol/L (ref 22–32)
Calcium: 8.5 mg/dL — ABNORMAL LOW (ref 8.9–10.3)
Chloride: 107 mmol/L (ref 98–111)
Creatinine, Ser: 1.16 mg/dL (ref 0.61–1.24)
GFR calc Af Amer: >60 mL/min (ref >60)
GFR calc non Af Amer: >60 mL/min (ref >60)
Glucose, Bld: 112 mg/dL — ABNORMAL HIGH (ref 70–99)
Potassium: 4.1 mmol/L (ref 3.5–5.1)
Sodium: 138 mmol/L (ref 135–145)

## 2019-11-13 MED ORDER — FUROSEMIDE 10 MG/ML IJ SOLN
40.0000 mg | Freq: Once | INTRAMUSCULAR | Status: AC
  Administered 2019-11-13: 40 mg via INTRAVENOUS
  Filled 2019-11-13: qty 4

## 2019-11-13 NOTE — Progress Notes (Addendum)
      [address]       [place], [address]             [phone]      4 Days Post-Op Procedure(s) (LRB): XI ROBOTIC ASSISTED THORACOSCOPY-WEDGE RESECTION LEFT LOWER LOBE (Left) Intercostal Nerve Block (Left)   Subjective:  No new complaints.  States that he is doing okay.  He is ambulating with nursing staff.  They have his oxygen weaned down to 4L  Objective: Vital signs in last 24 hours: Temp:  [98.3 F (36.8 C)-98.6 F (37 C)] 98.6 F (37 C) (04/18 0401) Pulse Rate:  [73-81] 78 (04/18 0401) Cardiac Rhythm: Normal sinus rhythm (04/18 0800) Resp:  [13-20] 18 (04/18 0401) BP: (111-122)/(69-95) 122/69 (04/18 0401) SpO2:  [88 %-96 %] 96 % (04/18 0401)  Intake/Output from previous day: 04/17 0701 - 04/18 0700 In: 720 [P.O.:720] Out: 1075 [Urine:1075]  General appearance: alert, cooperative, no distress and diaphoretic Heart: regular rate and rhythm Lungs: clear to auscultation bilaterally Abdomen: soft, non-tender; bowel sounds normal; no masses,  no organomegaly Extremities: edema trace Wound: clean and dry  Lab Results: Recent Labs    11/11/19 0615  WBC 9.0  HGB 9.6*  HCT 30.1*  PLT 171   BMET:  Recent Labs    11/12/19 0550 11/13/19 0312  NA 140 138  K 3.9 4.1  CL 109 107  CO2 25 25  GLUCOSE 106* 112*  BUN 16 15  CREATININE 1.22 1.16  CALCIUM 8.3* 8.5*    PT/INR: No results for input(s): LABPROT, INR in the last 72 hours. ABG    Component Value Date/Time   PHART 7.377 11/07/2019 1016   HCO3 24.0 11/07/2019 1016   TCO2 23 07/27/2014 0903   ACIDBASEDEF 0.5 11/07/2019 1016   O2SAT 95.1 11/07/2019 1016   CBG (last 3)  No results for input(s): GLUCAP in the last 72 hours.  Assessment/Plan: S/P Procedure(s) (LRB): XI ROBOTIC ASSISTED THORACOSCOPY-WEDGE RESECTION LEFT LOWER LOBE (Left) Intercostal Nerve Block (Left)  1. CV- NSR, brief episode of A. Fib... on Lopressor, ASA, Plavix 2. Pulm- + atelectasis, continue use of  flutter valve, O2 demand down to 4L., will continue to wean as able.. however he will require home use 3. Ethanol abuse--- continue CIWA protocol.. not using alcohol as he was given beer by pharmacy 4. GI- no BM yet, sorbitol per order 5. Lovenox for DVT prophylaxis 6. Dispo- patient stable, will continue to wean oxygen as able, monitor EtOH withdrawal, continue ambulation.. can hopefully d/c in next 24-48 hours depending on oxygen requirement   LOS: 4 days    Christopher Handler, PA-C  11/13/2019  Making progress with reduction in oxygen requirements Mental status stable patient examined and medical record reviewed,agree with above note. Tharon Aquas Trigt III 11/13/2019

## 2019-11-13 NOTE — Plan of Care (Signed)

## 2019-11-14 ENCOUNTER — Inpatient Hospital Stay (HOSPITAL_COMMUNITY): Payer: Medicare HMO

## 2019-11-14 MED ORDER — FLEET ENEMA 7-19 GM/118ML RE ENEM
1.0000 | ENEMA | Freq: Once | RECTAL | Status: AC
  Administered 2019-11-14: 1 via RECTAL
  Filled 2019-11-14: qty 1

## 2019-11-14 MED ORDER — OXYCODONE HCL 5 MG PO TABS: 5.0000 mg | ORAL_TABLET | Freq: Four times a day (QID) | ORAL | 0 refills | Status: AC | PRN

## 2019-11-14 MED ORDER — POTASSIUM CHLORIDE ER 20 MEQ PO TBCR: 20.0000 meq | EXTENDED_RELEASE_TABLET | Freq: Every day | ORAL | 0 refills | Status: DC

## 2019-11-14 MED ORDER — FUROSEMIDE 40 MG PO TABS: 40.0000 mg | ORAL_TABLET | Freq: Every day | ORAL | 0 refills | Status: DC

## 2019-11-14 MED ORDER — BUDESONIDE-FORMOTEROL FUMARATE 160-4.5 MCG/ACT IN AERO: 2.0000 | INHALATION_SPRAY | Freq: Two times a day (BID) | RESPIRATORY_TRACT | 1 refills | Status: DC

## 2019-11-14 MED ORDER — POTASSIUM CHLORIDE ER 10 MEQ PO TBCR
20.0000 meq | EXTENDED_RELEASE_TABLET | Freq: Every day | ORAL | Status: DC
  Administered 2019-11-14: 20 meq via ORAL
  Filled 2019-11-14 (×2): qty 2

## 2019-11-14 MED ORDER — FUROSEMIDE 40 MG PO TABS
40.0000 mg | ORAL_TABLET | Freq: Every day | ORAL | Status: DC
  Administered 2019-11-14: 40 mg via ORAL
  Filled 2019-11-14: qty 1

## 2019-11-14 NOTE — TOC Initial Note (Addendum)
Transition of Care [facility]) - Initial/Assessment Note    Patient Details  Name: Christopher Orr MRN: [id number] Date of Birth: 1946-03-27  [facility] CM/SW Contact:    Zenon Mayo, RN Phone Number: 11/14/2019, 3:17 PM  Clinical Narrative:                 NCM spoke with patient, he will need home oxygen , he is ok with [employer], referral given to [facility] with [facility], NCM offered choice for HHPT, he chose [facility], referral given to [place] . Soc will begin 24 to 48 hrs post dc. Order in for [facility], also but patient states he only need HHPT.   Expected Discharge Plan: [place] Barriers to Discharge: No Barriers Identified   Patient Goals and CMS Choice Patient states their goals for this hospitalization and ongoing recovery are:: get better CMS Medicare.gov Compare Post Acute Care list provided to:: Patient Choice offered to / list presented to : Patient  Expected Discharge Plan and Services Expected Discharge Plan: [place] In-house Referral: NA Discharge Planning Services: CM Consult Post Acute Care Choice: [place] arrangements for the past 2 months: Single Family Home Expected Discharge Date: 11/14/19               DME Arranged: Oxygen, [place] rolling DME Agency: [employer] Date DME Agency Contacted: 11/14/19 Time DME Agency Contacted: 43 Representative spoke with at DME Agency: [place]: PT [place]: [facility] [employer]) Date [place]: 11/14/19 Time [place]: [place] Representative spoke with at [place]: [place]  Prior Living Arrangements/Services Living arrangements for the past 2 months: [place] Lives with:: Adult Children Patient language and need for interpreter reviewed:: Yes Do you feel safe going back to the place where you live?: Yes      Need for Family Participation in Patient Care: Yes (Comment) Care giver support system in  place?: Yes (comment)   Criminal Activity/Legal Involvement Pertinent to Current Situation/Hospitalization: No - Comment as needed  Activities of Daily Living      Permission Sought/Granted                  Emotional Assessment Appearance:: Appears stated age Attitude/Demeanor/Rapport: Engaged Affect (typically observed): Appropriate Orientation: : Oriented to Self, Oriented to Place, Oriented to  Time, Oriented to Situation Alcohol / Substance Use: Not Applicable Psych Involvement: No (comment)  Admission diagnosis:  Nodule of lower lobe of left lung [R91.1] Patient Active Problem List   Diagnosis Date Noted  . Nodule of lower lobe of left lung 11/09/2019  . Lung cancer ([place]) 11/01/2019  . Abnormal findings on diagnostic imaging of digestive system   . Polyp of transverse colon   . Prediabetes 09/14/2019  . Kidney lesion 08/15/2019  . History of prostate cancer 08/15/2019  . Arthritis 08/15/2019  . Hernia, inguinal, bilateral 08/15/2019  . History of pancreatitis 08/15/2019  . Cirrhosis of liver ([place]) 08/15/2019  . Insomnia 08/15/2019  . Memory impairment 08/15/2019  . Nodule of middle lobe of right lung 08/15/2019  . Tobacco abuse 08/15/2019  . CAD (coronary artery disease)   . Degenerative disc disease, lumbar 11/01/2018  . Cigarette smoker 08/05/2018  . Acute respiratory failure with hypoxia ([place]) 07/06/2018  . Bilateral inguinal hernia, without obstruction or gangrene, recurrent   . Umbilical hernia without obstruction and without gangrene   . Panic attacks 06/14/2018  . Benzodiazepine dependence ([place])  06/14/2018  . Post traumatic stress disorder (PTSD) 06/14/2018  . Episode of recurrent major depressive disorder ([place]) 06/14/2018  . Coronary artery disease involving native heart with angina pectoris ([place]) 05/31/2018  . Osteoarthritis of multiple joints 05/31/2018  . Hyperlipidemia 05/31/2018  . Chronic viral hepatitis C ([place]) 09/24/2014  . Chest pain  07/28/2014  . COPD (chronic obstructive pulmonary disease) ([place]) 07/28/2014  . Anxiety 07/28/2014  . Aspiration pneumonia ([place]) 07/28/2014  . Elevated troponin I level 07/28/2014  . Acute kidney injury ([place]) 07/28/2014  . GERD (gastroesophageal reflux disease) 07/28/2014   PCP:  McLean-Scocuzza, [name], MD Pharmacy:   [id number] - [place], [place] [address] [place] [address] Phone: [phone] Fax: [phone]     Social Determinants of Health (SDOH) Interventions    Readmission Risk Interventions Readmission Risk Prevention Plan 11/14/2019  Transportation Screening Complete  PCP or Specialist Appt within 3-5 Days Complete  HRI or [place] Complete  Social Work Consult for [place] Planning/Counseling Complete  Palliative Care Screening Not Applicable  Medication Review Press photographer) Complete  Some recent data might be hidden

## 2019-11-14 NOTE — Progress Notes (Addendum)
[address]       [employer] [address]             [phone]      5 Days Post-Op Procedure(s) (LRB): XI ROBOTIC ASSISTED THORACOSCOPY-WEDGE RESECTION LEFT LOWER LOBE (Left) Intercostal Nerve Block (Left) Subjective: Feels better but still fairly weak, + constipated( no BM in 4 days)+ flatus On 1-2 liters  O2 but HFNC at times   Objective: Vital signs in last 24 hours: Temp:  [98.4 F (36.9 C)-98.9 F (37.2 C)] 98.9 F (37.2 C) (04/19 0451) Pulse Rate:  [69-86] 86 (04/19 0549) Cardiac Rhythm: Normal sinus rhythm (04/19 0700) Resp:  [11-18] 18 (04/19 0549) BP: (93-130)/(64-69) 121/64 (04/19 0451) SpO2:  [90 %-100 %] 96 % (04/19 0549) Weight:  [114.8 kg] 114.8 kg (04/18 1500)  Hemodynamic parameters for last 24 hours:    Intake/Output from previous day: 04/18 0701 - 04/19 0700 In: 1320 [P.O.:1320] Out: 2000 [Urine:2000] Intake/Output this shift: No intake/output data recorded.  General appearance: alert, cooperative and no distress Heart: regular rate and rhythm Lungs: dim left base Abdomen: + BS, non tender or distended Extremities: no edema Wound: oncis healing well  Lab Results: No results for input(s): WBC, HGB, HCT, PLT in the last 72 hours. BMET:  Recent Labs    11/12/19 0550 11/13/19 0312  NA 140 138  K 3.9 4.1  CL 109 107  CO2 25 25  GLUCOSE 106* 112*  BUN 16 15  CREATININE 1.22 1.16  CALCIUM 8.3* 8.5*    PT/INR: No results for input(s): LABPROT, INR in the last 72 hours. ABG    Component Value Date/Time   PHART 7.377 11/07/2019 1016   HCO3 24.0 11/07/2019 1016   TCO2 23 07/27/2014 0903   ACIDBASEDEF 0.5 11/07/2019 1016   O2SAT 95.1 11/07/2019 1016   CBG (last 3)  No results for input(s): GLUCAP in the last 72 hours.  Meds Scheduled Meds: . acetaminophen  1,000 mg Oral Q6H   Or  . acetaminophen (TYLENOL) oral liquid 160 mg/5 mL  1,000 mg Oral Q6H  . ALPRAZolam  1 mg Oral Daily   And  . ALPRAZolam  2 mg Oral  QHS  . ARIPiprazole  10 mg Oral Daily  . aspirin EC  81 mg Oral Daily  . bisacodyl  10 mg Oral Daily  . celecoxib  200 mg Oral QPC breakfast  . Chlorhexidine Gluconate Cloth  6 each Topical Daily  . clopidogrel  75 mg Oral Daily  . donepezil  5 mg Oral QHS  . enoxaparin (LOVENOX) injection  40 mg Subcutaneous Q24H  . FLUoxetine  60 mg Oral Daily  . folic acid  1 mg Oral Daily  . levalbuterol  0.63 mg Nebulization BID  . LORazepam  0-4 mg Intravenous Q8H  . memantine  5 mg Oral BID  . metoprolol succinate  25 mg Oral Daily  . mirabegron ER  50 mg Oral Daily  . multivitamin with minerals  1 tablet Oral Daily  . pantoprazole  40 mg Oral Daily  . rosuvastatin  10 mg Oral QHS  . senna-docusate  1 tablet Oral QHS  . sodium chloride flush  10-40 mL Intracatheter Q12H  . spiritus frumenti  1 each Oral BID WC  . tamsulosin  0.4 mg Oral QPC supper  . thiamine  100 mg Oral Daily   Or  . thiamine  100 mg Intravenous Daily  . traZODone  150 mg Oral QHS  Continuous Infusions: . sodium chloride    . sodium chloride 10 mL/hr at 11/10/19 0906   PRN Meds:.Place/Maintain arterial line **AND** sodium chloride, levalbuterol, LORazepam **OR** LORazepam, ondansetron (ZOFRAN) IV, oxyCODONE, sodium chloride flush  Xrays DG CHEST PORT 1 VIEW  Result Date: 11/13/2019 CLINICAL DATA:  Dyspnea. EXAM: PORTABLE CHEST 1 VIEW COMPARISON:  11/12/2019 FINDINGS: Right jugular catheter tip in the superior vena cava. Stable borderline enlarged cardiac silhouette. Mild increase in prominence of the pulmonary vasculature and interstitial markings. Increased mild patchy opacities in the left lower lung zone. Mildly increased right basilar atelectasis. Decreased right pleural fluid and no significant change in a small to moderate-sized left pleural effusion. IMPRESSION: 1. Mildly progressive changes of congestive heart failure. 2. Increased mild alveolar edema or pneumonia in the left lower lung zone. 3. Mildly increased  right basilar atelectasis. 4. Stable small to moderate-sized left pleural effusion. Electronically Signed   By: Claudie Revering M.D.   On: 11/13/2019 10:33    Assessment/Plan: S/P Procedure(s) (LRB): XI ROBOTIC ASSISTED THORACOSCOPY-WEDGE RESECTION LEFT LOWER LOBE (Left) Intercostal Nerve Block (Left)   1 steady overall progress 2 conts to require some O2, can arrange for home use 3 afebrile, VSS 4 no new labs, will check repeat CXR today, some vasc confestion/ASD in left base yesterday- did receive a dose of lasix 5 BS well controlled 6 will give enema today 7 poss home later today v tomorrow  LOS: 5 days    John Giovanni PA-C Pager [phone] 11/14/2019 Patient seen and examined, agree with above Home once he has a bowel movement Emphasized importance of ambulation  Remo Lipps C. Roxan Hockey, MD [facility] [phone]

## 2019-11-14 NOTE — Progress Notes (Signed)
Patient provided with verbal discharge instructions. Paper copy provided to patient. Daughter at bedside during discharge.  AVS given to daughter in discharge envelope.  RN answered all questions.  Patient IV removed. VSS at discharge. Patient equipment brought to bedside. Patient d/c via wheelchair with home O2 by tech through N tower entrance to private vehicle.

## 2019-11-14 NOTE — Progress Notes (Signed)
SATURATION QUALIFICATIONS: (This note is used to comply with regulatory documentation for home oxygen)  Patient Saturations on Room Air at Rest = 89-90%  Patient Saturations on Room Air while Ambulating = 83-88%  Patient Saturations on 3 Liters of oxygen while Ambulating = 92%  Please briefly explain why patient needs home oxygen:

## 2019-11-14 NOTE — Plan of Care (Signed)
  Problem: Education: Goal: Knowledge of General Education information will improve Description: Including pain rating scale, medication(s)/side effects and non-pharmacologic comfort measures Outcome: Progressing   Problem: Health Behavior/Discharge Planning: Goal: Ability to manage health-related needs will improve Outcome: Progressing   Problem: Clinical Measurements: Goal: Ability to maintain clinical measurements within normal limits will improve Outcome: Progressing Goal: Will remain free from infection Outcome: Progressing Goal: Diagnostic test results will improve Outcome: Progressing Goal: Respiratory complications will improve Outcome: Progressing Goal: Cardiovascular complication will be avoided Outcome: Progressing   Problem: Activity: Goal: Risk for activity intolerance will decrease Outcome: Progressing   Problem: Nutrition: Goal: Adequate nutrition will be maintained Outcome: Progressing   Problem: Coping: Goal: Level of anxiety will decrease Outcome: Progressing   Problem: Elimination: Goal: Will not experience complications related to bowel motility Outcome: Progressing Goal: Will not experience complications related to urinary retention Outcome: Progressing   Problem: Pain Managment: Goal: General experience of comfort will improve Outcome: Progressing   Problem: Safety: Goal: Ability to remain free from injury will improve Outcome: Progressing   Problem: Skin Integrity: Goal: Risk for impaired skin integrity will decrease Outcome: Progressing   Problem: Education: Goal: Knowledge of disease or condition will improve Outcome: Progressing Goal: Knowledge of the prescribed therapeutic regimen will improve Outcome: Progressing

## 2019-11-15 ENCOUNTER — Encounter: Payer: Self-pay | Admitting: Internal Medicine

## 2019-11-15 ENCOUNTER — Ambulatory Visit (INDEPENDENT_AMBULATORY_CARE_PROVIDER_SITE_OTHER): Payer: Medicare HMO | Admitting: Internal Medicine

## 2019-11-15 ENCOUNTER — Other Ambulatory Visit: Payer: Self-pay

## 2019-11-15 ENCOUNTER — Telehealth: Payer: Self-pay | Admitting: Internal Medicine

## 2019-11-15 VITALS — BP 102/70 | HR 87 | Temp 97.4°F | Ht 72.0 in | Wt 264.0 lb

## 2019-11-15 DIAGNOSIS — C3492 Malignant neoplasm of unspecified part of left bronchus or lung: Secondary | ICD-10-CM

## 2019-11-15 DIAGNOSIS — F32A Depression, unspecified: Secondary | ICD-10-CM

## 2019-11-15 DIAGNOSIS — T148XXA Other injury of unspecified body region, initial encounter: Secondary | ICD-10-CM

## 2019-11-15 DIAGNOSIS — R079 Chest pain, unspecified: Secondary | ICD-10-CM | POA: Diagnosis not present

## 2019-11-15 DIAGNOSIS — R32 Unspecified urinary incontinence: Secondary | ICD-10-CM | POA: Diagnosis not present

## 2019-11-15 DIAGNOSIS — Y95 Nosocomial condition: Secondary | ICD-10-CM | POA: Diagnosis not present

## 2019-11-15 DIAGNOSIS — J449 Chronic obstructive pulmonary disease, unspecified: Secondary | ICD-10-CM | POA: Diagnosis not present

## 2019-11-15 DIAGNOSIS — F329 Major depressive disorder, single episode, unspecified: Secondary | ICD-10-CM

## 2019-11-15 DIAGNOSIS — J189 Pneumonia, unspecified organism: Secondary | ICD-10-CM | POA: Diagnosis not present

## 2019-11-15 DIAGNOSIS — R5381 Other malaise: Secondary | ICD-10-CM | POA: Diagnosis not present

## 2019-11-15 DIAGNOSIS — F419 Anxiety disorder, unspecified: Secondary | ICD-10-CM | POA: Diagnosis not present

## 2019-11-15 MED ORDER — LEVOFLOXACIN 750 MG PO TABS: 750.0000 mg | ORAL_TABLET | Freq: Every day | ORAL | 0 refills | Status: DC

## 2019-11-15 MED ORDER — OXYCODONE HCL 5 MG PO TABS: 5.0000 mg | ORAL_TABLET | Freq: Four times a day (QID) | ORAL | 0 refills | Status: DC | PRN

## 2019-11-15 MED ORDER — MUPIROCIN 2 % EX OINT: 1.0000 "application " | TOPICAL_OINTMENT | Freq: Two times a day (BID) | CUTANEOUS | 0 refills | Status: DC

## 2019-11-15 NOTE — Telephone Encounter (Signed)
Inform pts daughter [name]   Per the surgeon he saw that CXR before he was discharged, he has a minimal fluid in the lung from surgery. rec he take lasix 40 mg daily.   He was having a fair amount of shortness of breath postop, mainly because he wasn't getting up enough and wasn't using his incentive spirometer enough. He has to use the incentive spirometer to exercise his lungs as much as possibly during the day   If he is having new shortness of breath or fevers, he needs to come to our office.  Can call for sooner appt than 11/29/19 if needed see info below  Dr. Roxan Hockey  Phone [phone]  [place]

## 2019-11-15 NOTE — Progress Notes (Signed)
Chief Complaint  Patient presents with  . Follow-up  . Hospitalization Follow-up    recent lung surgery last wednesday and released from hospital yesterday   HFU with daughter [name] today 1. [facility] 4/14/ to 11/14/19 for resection of left lung SCC + margins clear and 5 lymph nodes negative Dr. Merilynn Finland Dr. Faith Rogue was supposed to do this but had hand injury  On 3L O2 today c/o sob with exertion O2 today 96% Daughter and he did not hear about CXR 4/19 reviewed with mild to mod pleural effusion, ? Pneumonia and atelectasis  Chest tube is removed left chest  Will CC surgery about mild to moderate pleural effusion and sob and 5/10 let sided flank/chest pain with deep breaths Daughter feels he is unsafe to be home due to physical weakness, hallucinations, abnormal dreams and urinary incontinence  Agreeable to home PT/RN   2. Anxiety and depression PHQ 9 score 18 today and GAD 7 7 today needs referral to [facility], on xanax 1 mg, abilify 10 mg, aricept 5 mg namenda 5 mg, trazadone 150 mg qd    Review of Systems  Constitutional: Negative for weight loss.  HENT: Negative for hearing loss.   Eyes: Negative for blurred vision.  Respiratory: Positive for shortness of breath.   Cardiovascular: Positive for chest pain.  Genitourinary: Positive for flank pain.  Musculoskeletal: Negative for falls.  Skin: Negative for rash.  Psychiatric/Behavioral: Positive for depression. The patient is nervous/anxious.    Past Medical History:  Diagnosis Date  . Alcohol abuse    pt states it is marijuana  . Anemia   . Arthritis   . Bipolar disorder ([place])   . BPH (benign prostatic hypertrophy) with urinary obstruction   . CAD (coronary artery disease)    a. s/p stent to LAD in 2006 at [facility], EF 60% at that time.  . Cancer [facility])    prostate  . CHF (congestive heart failure) ([place])   . Chronic pancreatitis ([place])   . Cirrhosis ([place])   . COPD (chronic obstructive pulmonary disease) ([place])   . Dementia ([place])     early dementia  . Depression   . Diverticulosis   . Drug use   . Dyspnea   . ED (erectile dysfunction)   . Gastritis   . GERD (gastroesophageal reflux disease)   . Headache    occasional migraines  . Hepatitis C    treated  . History of lumbar fusion   . Hyperlipidemia   . Hypertension    patient denies having high blood pressure  . Mitral regurgitation   . Pancreatitis    x 2   . Pneumonia    07/06/18   . PTSD (post-traumatic stress disorder)   . Rib fracture    s/p fall 03/15/19   . Sinus disease   . Urge incontinence    Past Surgical History:  Procedure Laterality Date  . CARDIAC CATHETERIZATION  2006  . cateract  2013  . COLONOSCOPY WITH PROPOFOL N/A 10/04/2019   Procedure: COLONOSCOPY WITH PROPOFOL;  Surgeon: Lucilla Lame, MD;  Location: [facility] ENDOSCOPY;  Service: Endoscopy;  Laterality: N/A;  . CORONARY ANGIOPLASTY  2006  . CORONARY STENT PLACEMENT  2006   x 1  . ESOPHAGOGASTRODUODENOSCOPY N/A 12/22/2014   Procedure: ESOPHAGOGASTRODUODENOSCOPY (EGD);  Surgeon: [name], MD;  Location: [facility] ENDOSCOPY;  Service: Endoscopy;  Laterality: N/A;  . ESOPHAGOGASTRODUODENOSCOPY (EGD) WITH PROPOFOL N/A 10/04/2019   Procedure: ESOPHAGOGASTRODUODENOSCOPY (EGD) WITH PROPOFOL;  Surgeon: Lucilla Lame, MD;  Location:  [facility] ENDOSCOPY;  Service: Endoscopy;  Laterality: N/A;  . HERNIA REPAIR  2015   left groin  . INTERCOSTAL NERVE BLOCK Left 11/09/2019   Procedure: Intercostal Nerve Block;  Surgeon: Melrose Nakayama, MD;  Location: [place];  Service: Thoracic;  Laterality: Left;  . LUMBAR FUSION    . RADIOACTIVE SEED IMPLANT N/A 04/22/2016   Procedure: RADIOACTIVE SEED IMPLANT/BRACHYTHERAPY IMPLANT;  Surgeon: Hollice Espy, MD;  Location: [facility] ORS;  Service: Urology;  Laterality: N/A;  . ROBOT ASSISTED INGUINAL HERNIA REPAIR Bilateral 07/06/2018   Procedure: ROBOT ASSISTED INGUINAL HERNIA REPAIR;  Surgeon: Jules Husbands, MD;  Location: [facility] ORS;  Service: General;  Laterality:  Bilateral;  . TONSILLECTOMY    . UMBILICAL HERNIA REPAIR N/A 07/06/2018   Procedure: LAPAROSCOPIC ROBOT ASSISTED UMBILICAL HERNIA;  Surgeon: Jules Husbands, MD;  Location: [facility] ORS;  Service: General;  Laterality: N/A;  . XI ROBOTIC ASSISTED THORACOSCOPY- SEGMENTECTOMY Left 11/09/2019   Procedure: XI ROBOTIC ASSISTED THORACOSCOPY-WEDGE RESECTION LEFT LOWER LOBE;  Surgeon: Melrose Nakayama, MD;  Location: [facility] OR;  Service: Thoracic;  Laterality: Left;   Family History  Problem Relation Age of Onset  . Heart disease Father        Unclear details. Father passed in late 75's 2/2 cancer.  . Colon cancer Father   . Alcohol abuse Sister   . Drug abuse Sister   . Anxiety disorder Sister   . Depression Sister   . Kidney disease Neg Hx   . Prostate cancer Neg Hx    Social History   Socioeconomic History  . Marital status: Divorced    Spouse name: Not on file  . Number of children: 4  . Years of education: Not on file  . Highest education level: GED or equivalent  Occupational History  . Occupation: Aeronautical engineer    Comment: retired  Tobacco Use  . Smoking status: Former Smoker    Types: Cigarettes    Start date: 07/28/1962    Quit date: 09/01/2019    Years since quitting: 0.2  . Smokeless tobacco: Never Used  Substance and Sexual Activity  . Alcohol use: Yes    Alcohol/week: 0.0 standard drinks    Comment: states he drinks a pint a day (11/07/19)   . Drug use: Yes    Frequency: 7.0 times per week    Types: Marijuana    Comment: Endorsed heroin 06/2014, UDS also + benzos, opiates, THC, neg for cocaine at that time.  Marland Kitchen Sexual activity: Not Currently  Other Topics Concern  . Not on file  Social History Narrative   Lives at home    Has kids and grandkids   Smoker x 55 years 1 ppd as of 07/2019 he did quit x 2 years    Drinks 1 pt liq qd as of 07/2019    Social Determinants of Health   Financial Resource Strain: Low Risk   . Difficulty of Paying Living Expenses: Not very  hard  Food Insecurity: Unknown  . Worried About Charity fundraiser in the Last Year: Never true  . Ran Out of Food in the Last Year: Not on file  Transportation Needs:   . Lack of Transportation (Medical):   Marland Kitchen Lack of Transportation (Non-Medical):   Physical Activity:   . Days of Exercise per Week:   . Minutes of Exercise per Session:   Stress:   . Feeling of Stress :   Social Connections:   . Frequency of Communication with Friends  and Family:   . Frequency of Social Gatherings with Friends and Family:   . Attends Religious Services:   . Active Member of Clubs or Organizations:   . Attends Archivist Meetings:   Marland Kitchen Marital Status:   Intimate Partner Violence:   . Fear of Current or Ex-Partner:   . Emotionally Abused:   Marland Kitchen Physically Abused:   . Sexually Abused:    Current Meds  Medication Sig  . albuterol (PROVENTIL) (2.5 MG/3ML) 0.083% nebulizer solution Take 3 mLs (2.5 mg total) by nebulization every 4 (four) hours as needed for wheezing or shortness of breath.  . alprazolam (XANAX) 1 MG tablet Take 1 tablet (1 mg total) by mouth as directed. 1/2 in am 1 pill qhs  . ARIPiprazole (ABILIFY) 10 MG tablet Take 10 mg by mouth daily.  Marland Kitchen aspirin EC 81 MG tablet Take 81 mg by mouth daily.  . celecoxib (CELEBREX) 200 MG capsule Take 1 capsule (200 mg total) by mouth daily after breakfast.  . clopidogrel (PLAVIX) 75 MG tablet Take 1 tablet (75 mg total) by mouth daily. Reported on 01/15/2016  . donepezil (ARICEPT) 5 MG tablet Take 1 tablet (5 mg total) by mouth at bedtime.  Marland Kitchen FLUoxetine (PROZAC) 20 MG capsule Take 60 mg by mouth daily.  . folic acid (FOLVITE) 1 MG tablet TAKE 1 TABLET BY MOUTH EVERY DAY (Patient taking differently: Take 1 mg by mouth daily. )  . furosemide (LASIX) 40 MG tablet Take 1 tablet (40 mg total) by mouth daily.  . memantine (NAMENDA) 5 MG tablet Take 1 tablet (5 mg total) by mouth 2 (two) times daily. Taking qd  . metoprolol succinate (TOPROL-XL) 25 MG  24 hr tablet Take 1 tablet (25 mg total) by mouth daily.  . Multiple Vitamins-Minerals (MULTIVITAMIN WITH MINERALS) tablet Take 1 tablet by mouth daily.  Marland Kitchen MYRBETRIQ 50 MG TB24 tablet TAKE 1 TABLET BY MOUTH EVERY DAY (Patient taking differently: Take 50 mg by mouth daily. )  . oxyCODONE (OXY IR/ROXICODONE) 5 MG immediate release tablet Take 1 tablet (5 mg total) by mouth every 6 (six) hours as needed for up to 7 days for moderate pain.  . pantoprazole (PROTONIX) 40 MG tablet Take 1 tablet (40 mg total) by mouth daily. 30 min before breakfast  . potassium chloride 20 MEQ TBCR Take 20 mEq by mouth daily.  . rosuvastatin (CRESTOR) 10 MG tablet Take 1 tablet (10 mg total) by mouth at bedtime. (Patient taking differently: Take 10 mg by mouth daily. )  . tamsulosin (FLOMAX) 0.4 MG CAPS capsule Take 1 capsule (0.4 mg total) by mouth daily after supper. (Patient taking differently: Take 0.4 mg by mouth daily. )  . traZODone (DESYREL) 150 MG tablet Take 1 tablet (150 mg total) by mouth at bedtime.   No Known Allergies Recent Results (from the past 2160 hour(s))  I-STAT creatinine     Status: None   Collection Time: 08/30/19  8:54 AM  Result Value Ref Range   Creatinine, Ser 1.20 0.61 - 1.24 mg/dL  Glucose, capillary     Status: Abnormal   Collection Time: 09/07/19  8:09 AM  Result Value Ref Range   Glucose-Capillary 106 (H) 70 - 99 mg/dL  SARS CORONAVIRUS 2 (TAT 6-24 HRS) Nasopharyngeal Nasopharyngeal Swab     Status: None   Collection Time: 09/07/19 11:00 AM   Specimen: Nasopharyngeal Swab  Result Value Ref Range   SARS Coronavirus 2 NEGATIVE NEGATIVE    Comment: (NOTE) SARS-CoV-2  target nucleic acids are NOT DETECTED. The SARS-CoV-2 RNA is generally detectable in upper and lower respiratory specimens during the acute phase of infection. Negative results do not preclude SARS-CoV-2 infection, do not rule out co-infections with other pathogens, and should not be used as the sole basis for  treatment or other patient management decisions. Negative results must be combined with clinical observations, patient history, and epidemiological information. The expected result is Negative. Fact Sheet for Patients: SugarRoll.be Fact Sheet for Healthcare Providers: https://www.woods-mathews.com/ This test is not yet approved or cleared by the [place] FDA and  has been authorized for detection and/or diagnosis of SARS-CoV-2 by FDA under an Emergency Use Authorization (EUA). This EUA will remain  in effect (meaning this test can be used) for the duration of the COVID-19 declaration under Section 56 4(b)(1) of the Act, 21 U.S.C. section 360bbb-3(b)(1), unless the authorization is terminated or revoked sooner. Performed at [facility], [place] [address] [place], [place] [address]   Blood gas, arterial     Status: None   Collection Time: 09/08/19  6:57 AM  Result Value Ref Range   FIO2 0.21    pH, Arterial 7.44 7.350 - 7.450   pCO2 arterial 35 32.0 - 48.0 mmHg   pO2, Arterial 84 83.0 - 108.0 mmHg   Bicarbonate 23.8 20.0 - 28.0 mmol/L   Acid-Base Excess 1.0 0.0 - 2.0 mmol/L   O2 Saturation 96.7 %   Collection site RIGHT RADIAL    Sample type ARTERIAL DRAW    Allens test (pass/fail) PASS PASS    Comment: Performed at [facility], [address] [place], [place] [address]  Hepatitis C antibody     Status: Abnormal   Collection Time: 09/14/19  9:47 AM  Result Value Ref Range   Hepatitis C Ab REACTIVE (A) NON-REACTI   SIGNAL TO CUT-OFF 33.10 (H) <1.00    Comment: . Based on this result, the sample will be tested for HCV RNA by a Nucleic Acid Amplification Test (NAAT) to determine if the patient has a current active infection. .   Vitamin D (25 hydroxy)     Status: None   Collection Time: 09/14/19  9:47 AM  Result Value Ref Range   VITD 33.67 30.00 - 100.00 ng/mL  HgB A1c     Status: None   Collection Time:  09/14/19  9:47 AM  Result Value Ref Range   Hgb A1c MFr Bld 6.2 4.6 - 6.5 %    Comment: Glycemic Control Guidelines for People with Diabetes:Non Diabetic:  <6%Goal of Therapy: <7%Additional Action Suggested:  >8%   Urinalysis, Routine w reflex microscopic     Status: None   Collection Time: 09/14/19  9:47 AM  Result Value Ref Range   Color, Urine YELLOW YELLOW   APPearance CLEAR CLEAR   Specific Gravity, Urine 1.019 1.001 - 1.03   pH 6.5 5.0 - 8.0   Glucose, UA NEGATIVE NEGATIVE   Bilirubin Urine NEGATIVE NEGATIVE   Ketones, ur NEGATIVE NEGATIVE   Hgb urine dipstick NEGATIVE NEGATIVE   Protein, ur NEGATIVE NEGATIVE   Nitrite NEGATIVE NEGATIVE   Leukocytes,Ua NEGATIVE NEGATIVE  TSH     Status: None   Collection Time: 09/14/19  9:47 AM  Result Value Ref Range   TSH 0.77 0.35 - 4.50 uIU/mL  CBC with Differential/Platelet     Status: Abnormal   Collection Time: 09/14/19  9:47 AM  Result Value Ref Range   WBC 7.2 4.0 - 10.5 K/uL  RBC 3.89 (L) 4.22 - 5.81 Mil/uL   Hemoglobin 13.2 13.0 - 17.0 g/dL   HCT 39.3 39.0 - 52.0 %   MCV 101.0 (H) 78.0 - 100.0 fl   MCHC 33.6 30.0 - 36.0 g/dL   RDW 15.1 11.5 - 15.5 %   Platelets 228.0 150.0 - 400.0 K/uL   Neutrophils Relative % 62.5 43.0 - 77.0 %   Lymphocytes Relative 23.3 12.0 - 46.0 %   Monocytes Relative 9.0 3.0 - 12.0 %   Eosinophils Relative 4.2 0.0 - 5.0 %   Basophils Relative 1.0 0.0 - 3.0 %   Neutro Abs 4.5 1.4 - 7.7 K/uL   Lymphs Abs 1.7 0.7 - 4.0 K/uL   Monocytes Absolute 0.6 0.1 - 1.0 K/uL   Eosinophils Absolute 0.3 0.0 - 0.7 K/uL   Basophils Absolute 0.1 0.0 - 0.1 K/uL  Lipid panel     Status: None   Collection Time: 09/14/19  9:47 AM  Result Value Ref Range   Cholesterol 131 0 - 200 mg/dL    Comment: ATP III Classification       Desirable:  < 200 mg/dL               Borderline High:  200 - 239 mg/dL          High:  > = 240 mg/dL   Triglycerides 133.0 0.0 - 149.0 mg/dL    Comment: Normal:  <150 mg/dLBorderline High:  150  - 199 mg/dL   HDL 48.90 >39.00 mg/dL   VLDL 26.6 0.0 - 40.0 mg/dL   LDL Cholesterol 55 0 - 99 mg/dL   Total CHOL/HDL Ratio 3     Comment:                Men          Women1/2 Average Risk     3.4          3.3Average Risk          5.0          4.42X Average Risk          9.6          7.13X Average Risk          15.0          11.0                       NonHDL 82.05     Comment: NOTE:  Non-HDL goal should be 30 mg/dL higher than patient's LDL goal (i.e. LDL goal of < 70 mg/dL, would have non-HDL goal of < 100 mg/dL)  Comprehensive metabolic panel     Status: Abnormal   Collection Time: 09/14/19  9:47 AM  Result Value Ref Range   Sodium 140 135 - 145 mEq/L   Potassium 4.7 3.5 - 5.1 mEq/L   Chloride 104 96 - 112 mEq/L   CO2 31 19 - 32 mEq/L   Glucose, Bld 96 70 - 99 mg/dL   BUN 17 6 - 23 mg/dL   Creatinine, Ser 1.19 0.40 - 1.50 mg/dL   Total Bilirubin 0.5 0.2 - 1.2 mg/dL   Alkaline Phosphatase 92 39 - 117 U/L   AST 19 0 - 37 U/L   ALT 18 0 - 53 U/L   Total Protein 6.7 6.0 - 8.3 g/dL   Albumin 4.0 3.5 - 5.2 g/dL   GFR 59.91 (L) >60.00 mL/min   Calcium 8.9 8.4 - 10.5 mg/dL  HCV RNA, Quantitative Real Time PCR     Status: None   Collection Time: 09/14/19  9:47 AM  Result Value Ref Range   HCV RNA, PCR, QN <15 NOT DETECTED NOT DETECT IU/mL   HCV Quantitative Log <1.18 NOT DETECTED NOT DETECT Log IU/mL    Comment: . HCV RNA is not detected.  There is no laboratory  evidence of a current active HCV infection.  . This pattern of results (undetectable HCV RNA combined with reactive HCV antibody) could be consistent with a resolved past infection if the clinical history is  compatible with previous HCV exposure.  However, if no previous exposure is suspected, the reactive HCV  antibody could be a biological false positive result. . . This test was performed using Real-Time Polymerase Chain Reaction. . Reportable Range: 15 IU/mL to 100,000,000 IU/mL (1.18 Log IU/mL to 8.00 Log  IU/mL). . The analytical performance characteristics of this assay have been determined by [facility].  The modifications have not been cleared or approved by the FDA. This assay has been validated pursuant to the  CLIA regulations and is used for clinical purposes.   . For more information on this test, go to: http://education.questdiagnostics.com/faq/FAQ22v1 (This link is being provided for in formational/ educational purposes only.) . This assay is intended for use as an aid in the diagnosis of HCV infection and the management of HCV infected patients undergoing anti-viral therapy. Marland Kitchen   SARS CORONAVIRUS 2 (TAT 6-24 HRS) Nasopharyngeal Nasopharyngeal Swab     Status: None   Collection Time: 09/30/19 11:55 AM   Specimen: Nasopharyngeal Swab  Result Value Ref Range   SARS Coronavirus 2 NEGATIVE NEGATIVE    Comment: (NOTE) SARS-CoV-2 target nucleic acids are NOT DETECTED. The SARS-CoV-2 RNA is generally detectable in upper and lower respiratory specimens during the acute phase of infection. Negative results do not preclude SARS-CoV-2 infection, do not rule out co-infections with other pathogens, and should not be used as the sole basis for treatment or other patient management decisions. Negative results must be combined with clinical observations, patient history, and epidemiological information. The expected result is Negative. Fact Sheet for Patients: SugarRoll.be Fact Sheet for Healthcare Providers: https://www.woods-mathews.com/ This test is not yet approved or cleared by the [place] FDA and  has been authorized for detection and/or diagnosis of SARS-CoV-2 by FDA under an Emergency Use Authorization (EUA). This EUA will remain  in effect (meaning this test can be used) for the duration of the COVID-19 declaration under Section 56 4(b)(1) of the Act, 21 U.S.C. section 360bbb-3(b)(1), unless the authorization is  terminated or revoked sooner. Performed at [facility], [place] [address] [place], [place] [address]   Surgical pathology     Status: None   Collection Time: 10/04/19 10:05 AM  Result Value Ref Range   SURGICAL PATHOLOGY      SURGICAL PATHOLOGY CASE: [id number] PATIENT: [name] Surgical Pathology Report     Specimen Submitted: A. Colon polyp, transverse; cbx B. Colon polyp, sigmoid; cbx  Clinical History: Screening for esophageal varices I85.01 Screening Z12.11. Irregular z-line, diverticulosis, colon polyps.      DIAGNOSIS: A.  COLON POLYP, TRANSVERSE; COLD BIOPSY: - TUBULAR ADENOMA. - NEGATIVE FOR HIGH-GRADE DYSPLASIA AND MALIGNANCY.  B.  COLON POLYP, SIGMOID; COLD BIOPSY: - HYPERPLASTIC POLYP. - NEGATIVE FOR DYSPLASIA AND MALIGNANCY.  GROSS DESCRIPTION: A. Labeled: Transverse colon polyp, C BX Received: Formalin Tissue fragment(s): 1 Size: 0.4 cm Description: Tan soft tissue fragment Entirely submitted in 1  cassette.  B. Labeled: Sigmoid colon polyp, C BX Received: Formalin Tissue fragment(s): 1 Size: 0.3 cm Description: Tan soft tissue fragment Entirely submitted in 1 cassette.    Final Diagnosis performed by Quay Burow, MD.   Electronically signed  10/05/2019 11:49:17AM The electronic signature indicates that the named Attending Pathologist has evaluated the specimen Technical component performed at [facility], [address] [place], [place] [address] Lab: [phone] Dir: Rush Farmer, MD, MMM  Professional component performed at [facility], [facility], [place], [place], [place] [address] Lab: [phone] Dir: Dellia Nims. Rubinas, MD   SARS CORONAVIRUS 2 (TAT 6-24 HRS) Nasopharyngeal Nasopharyngeal Swab     Status: None   Collection Time: 10/13/19 10:08 AM   Specimen: Nasopharyngeal Swab  Result Value Ref Range   SARS Coronavirus 2 NEGATIVE NEGATIVE    Comment: (NOTE) SARS-CoV-2 target nucleic acids are  NOT DETECTED. The SARS-CoV-2 RNA is generally detectable in upper and lower respiratory specimens during the acute phase of infection. Negative results do not preclude SARS-CoV-2 infection, do not rule out co-infections with other pathogens, and should not be used as the sole basis for treatment or other patient management decisions. Negative results must be combined with clinical observations, patient history, and epidemiological information. The expected result is Negative. Fact Sheet for Patients: SugarRoll.be Fact Sheet for Healthcare Providers: https://www.woods-mathews.com/ This test is not yet approved or cleared by the [place] FDA and  has been authorized for detection and/or diagnosis of SARS-CoV-2 by FDA under an Emergency Use Authorization (EUA). This EUA will remain  in effect (meaning this test can be used) for the duration of the COVID-19 declaration under Section 56 4(b)(1) of the Act, 21 U.S.C. section 360bbb-3(b)(1), unless the authorization is terminated or revoked sooner. Performed at [facility], [place] [address], [place], [place] [address]   CBC with Differential/Platelet     Status: Abnormal   Collection Time: 10/17/19  9:03 AM  Result Value Ref Range   WBC 12.6 (H) 4.0 - 10.5 K/uL   RBC 4.06 (L) 4.22 - 5.81 MIL/uL   Hemoglobin 13.4 13.0 - 17.0 g/dL   HCT 40.5 39.0 - 52.0 %   MCV 99.8 80.0 - 100.0 fL   MCH 33.0 26.0 - 34.0 pg   MCHC 33.1 30.0 - 36.0 g/dL   RDW 14.3 11.5 - 15.5 %   Platelets 228 150 - 400 K/uL   nRBC 0.0 0.0 - 0.2 %   Neutrophils Relative % 76 %   Neutro Abs 9.6 (H) 1.7 - 7.7 K/uL   Lymphocytes Relative 13 %   Lymphs Abs 1.7 0.7 - 4.0 K/uL   Monocytes Relative 7 %   Monocytes Absolute 0.9 0.1 - 1.0 K/uL   Eosinophils Relative 3 %   Eosinophils Absolute 0.4 0.0 - 0.5 K/uL   Basophils Relative 1 %   Basophils Absolute 0.1 0.0 - 0.1 K/uL   Immature Granulocytes 0 %   Abs Immature  Granulocytes 0.03 0.00 - 0.07 K/uL    Comment: Performed at [facility], [address] [place], [place] [address]  Protime-INR     Status: None   Collection Time: 10/17/19  9:03 AM  Result Value Ref Range   Prothrombin Time 12.4 11.4 - 15.2 seconds   INR 0.9 0.8 - 1.2    Comment: (NOTE) INR goal varies based on device and disease states. Performed at [facility], [address], [place], [place] [address]   Surgical pathology  Status: None   Collection Time: 10/17/19 11:01 AM  Result Value Ref Range   SURGICAL PATHOLOGY      SURGICAL PATHOLOGY CASE: [id number] PATIENT: [name] Surgical Pathology Report     Specimen Submitted: A. Lung nodule, LLL  Clinical History: LLL nodule. Lung CA.      DIAGNOSIS: A. LUNG, LEFT LOWER LOBE; CT-GUIDED BIOPSY: - DIAGNOSTIC OF MALIGNANCY. - SQUAMOUS CELL CARCINOMA.   GROSS DESCRIPTION: A. Labeled: Left lower lobe PET positive nodule Received: In formalin Tissue fragment(s): Multiple Size: 0.7 x 0.3 x 0.1 cm Description: Aggregate of pink-red to white tissue fragments Entirely submitted in 1 cassette.   Final Diagnosis performed by Betsy Pries, MD.   Electronically signed 10/18/2019 12:07:14PM The electronic signature indicates that the named Attending Pathologist has evaluated the specimen Technical component performed at [facility], [address] [place], [place] [address] Lab: [phone] Dir: Rush Farmer, MD, MMM  Professional component performed at [facility], [address] [place], [place] [address] Lab: [phone] Dir: Dellia Nims. Reuel Derby, MD   Surgical pathology     Status: None   Collection Time: 10/17/19 11:02 AM  Result Value Ref Range   SURGICAL PATHOLOGY      SURGICAL PATHOLOGY CASE: [id number] PATIENT: [name] Surgical Pathology Report     Specimen Submitted: A. Parotid mass, left  Clinical History: Parotid mass, LLL lung  mass      DIAGNOSIS: A. PAROTID MASS, LEFT; ULTRASOUND-GUIDED NEEDLE CORE BIOPSY: - PAPILLARY CYSTADENOMA LYMPHOMATOSUM (WARTHIN TUMOR). - NEGATIVE FOR MALIGNANCY.   GROSS DESCRIPTION: A. Labeled: Left parotid PET positive mass Received: In formalin Tissue fragment(s): Multiple Size: 0.9 x 0.3 x 0.1 cm Description: Aggregate of pink-red to white tissue fragments Entirely submitted in 1 cassette.   Final Diagnosis performed by Betsy Pries, MD.   Electronically signed 10/18/2019 9:31:24AM The electronic signature indicates that the named Attending Pathologist has evaluated the specimen Technical component performed at [facility], [address], [place], [place] [address] Lab: [phone] Dir: Rush Farmer, MD, MMM  Professional component performed at [facility], [facility], [place], [place], [place] [address] Lab: [phone] Dir: Dellia Nims. Reuel Derby, MD   PSA     Status: None   Collection Time: 11/04/19  1:40 PM  Result Value Ref Range   Prostate Specific Ag, Serum 0.2 0.0 - 4.0 ng/mL    Comment: Roche ECLIA methodology. According to the American Urological Association, Serum PSA should decrease and remain at undetectable levels after radical prostatectomy. The AUA defines biochemical recurrence as an initial PSA value 0.2 ng/mL or greater followed by a subsequent confirmatory PSA value 0.2 ng/mL or greater. Values obtained with different assay methods or kits cannot be used interchangeably. Results cannot be interpreted as absolute evidence of the presence or absence of malignant disease.   Surgical pcr screen     Status: None   Collection Time: 11/07/19  9:59 AM   Specimen: Nasal Mucosa; Nasal Swab  Result Value Ref Range   MRSA, PCR NEGATIVE NEGATIVE   Staphylococcus aureus NEGATIVE NEGATIVE    Comment: (NOTE) The Xpert SA Assay (FDA approved for NASAL specimens in patients 37 years of age and older), is one component of a  comprehensive surveillance program. It is not intended to diagnose infection nor to guide or monitor treatment. Performed at [facility], [place] [address], [place], [place] [address]   APTT     Status: None   Collection Time: 11/07/19  9:59 AM  Result Value Ref Range   aPTT 29 24 - 36 seconds    Comment: Performed at [facility] [address], [place], [place] [address]  CBC     Status: Abnormal   Collection Time: 11/07/19  9:59 AM  Result Value Ref Range   WBC 6.5 4.0 - 10.5 K/uL   RBC 3.90 (L) 4.22 - 5.81 MIL/uL   Hemoglobin 12.8 (L) 13.0 - 17.0 g/dL   HCT 39.5 39.0 - 52.0 %   MCV 101.3 (H) 80.0 - 100.0 fL   MCH 32.8 26.0 - 34.0 pg   MCHC 32.4 30.0 - 36.0 g/dL   RDW 14.4 11.5 - 15.5 %   Platelets 230 150 - 400 K/uL   nRBC 0.0 0.0 - 0.2 %    Comment: Performed at [facility], [place] [address], [place], [place] [address]  Comprehensive metabolic panel     Status: Abnormal   Collection Time: 11/07/19  9:59 AM  Result Value Ref Range   Sodium 139 135 - 145 mmol/L   Potassium 4.6 3.5 - 5.1 mmol/L   Chloride 108 98 - 111 mmol/L   CO2 20 (L) 22 - 32 mmol/L   Glucose, Bld 100 (H) 70 - 99 mg/dL    Comment: Glucose reference range applies only to samples taken after fasting for at least 8 hours.   BUN 16 8 - 23 mg/dL   Creatinine, Ser 1.17 0.61 - 1.24 mg/dL   Calcium 8.6 (L) 8.9 - 10.3 mg/dL   Total Protein 6.9 6.5 - 8.1 g/dL   Albumin 3.8 3.5 - 5.0 g/dL   AST 20 15 - 41 U/L   ALT 18 0 - 44 U/L   Alkaline Phosphatase 79 38 - 126 U/L   Total Bilirubin 0.5 0.3 - 1.2 mg/dL   GFR calc non Af Amer >60 >60 mL/min   GFR calc Af Amer >60 >60 mL/min   Anion gap 11 5 - 15    Comment: Performed at [facility], [place] [address] [place], [place] [address]  Protime-INR     Status: None   Collection Time: 11/07/19  9:59 AM  Result Value Ref Range   Prothrombin Time 12.9 11.4 - 15.2 seconds   INR 1.0 0.8 - 1.2    Comment: (NOTE) INR goal varies based on device  and disease states. Performed at [facility], [place] [address], [place], [place] [address]   Urinalysis, Routine w reflex microscopic     Status: Abnormal   Collection Time: 11/07/19 10:00 AM  Result Value Ref Range   Color, Urine YELLOW YELLOW   APPearance CLEAR CLEAR   Specific Gravity, Urine 1.017 1.005 - 1.030   pH 6.0 5.0 - 8.0   Glucose, UA NEGATIVE NEGATIVE mg/dL   Hgb urine dipstick SMALL (A) NEGATIVE   Bilirubin Urine NEGATIVE NEGATIVE   Ketones, ur NEGATIVE NEGATIVE mg/dL   Protein, ur NEGATIVE NEGATIVE mg/dL   Nitrite NEGATIVE NEGATIVE   Leukocytes,Ua NEGATIVE NEGATIVE   RBC / HPF 6-10 0 - 5 RBC/hpf   WBC, UA 0-5 0 - 5 WBC/hpf   Bacteria, UA RARE (A) NONE SEEN   Squamous Epithelial / LPF 0-5 0 - 5   Mucus PRESENT     Comment: Performed at [facility], [place] [address], [place], [place] [address]  Blood gas, arterial on room air     Status: Abnormal   Collection Time: 11/07/19 10:16 AM  Result Value  Ref Range   FIO2 21.00    pH, Arterial 7.377 7.350 - 7.450   pCO2 arterial 41.8 32.0 - 48.0 mmHg   pO2, Arterial 87.1 83.0 - 108.0 mmHg   Bicarbonate 24.0 20.0 - 28.0 mmol/L   Acid-base deficit 0.5 0.0 - 2.0 mmol/L   O2 Saturation 95.1 %   Patient temperature 37.0    Collection site LEFT BRACHIAL    Drawn by [id number]    Sample type ARTERIAL DRAW    Allens test (pass/fail) BRACHIAL ARTERY (A) PASS    Comment: Performed at [facility], [place] [address] [place], [place] [address]  Type and screen     Status: None   Collection Time: 11/07/19 10:16 AM  Result Value Ref Range   ABO/RH(D) O POS    Antibody Screen NEG    Sample Expiration      11/10/2019,2359 Performed at [facility], [place] [address] [place], [place] [address]   ABO/Rh     Status: None   Collection Time: 11/07/19 10:16 AM  Result Value Ref Range   ABO/RH(D)      O POS Performed at [facility] [address] [place], [place] [address]   SARS CORONAVIRUS 2 (TAT 6-24  HRS) Nasopharyngeal Nasopharyngeal Swab     Status: None   Collection Time: 11/07/19 11:05 AM   Specimen: Nasopharyngeal Swab  Result Value Ref Range   SARS Coronavirus 2 NEGATIVE NEGATIVE    Comment: (NOTE) SARS-CoV-2 target nucleic acids are NOT DETECTED. The SARS-CoV-2 RNA is generally detectable in upper and lower respiratory specimens during the acute phase of infection. Negative results do not preclude SARS-CoV-2 infection, do not rule out co-infections with other pathogens, and should not be used as the sole basis for treatment or other patient management decisions. Negative results must be combined with clinical observations, patient history, and epidemiological information. The expected result is Negative. Fact Sheet for Patients: SugarRoll.be Fact Sheet for Healthcare Providers: https://www.woods-mathews.com/ This test is not yet approved or cleared by the [place] FDA and  has been authorized for detection and/or diagnosis of SARS-CoV-2 by FDA under an Emergency Use Authorization (EUA). This EUA will remain  in effect (meaning this test can be used) for the duration of the COVID-19 declaration under Section 56 4(b)(1) of the Act, 21 U.S.C. section 360bbb-3(b)(1), unless the authorization is terminated or revoked sooner. Performed at [facility], [place] [address] [place],  [address]   Urinalysis, Complete     Status: Abnormal   Collection Time: 11/08/19 11:08 AM  Result Value Ref Range   Specific Gravity, UA 1.020 1.005 - 1.030   pH, UA 6.0 5.0 - 7.5   Color, UA Yellow Yellow   Appearance Ur Clear Clear   Leukocytes,UA Negative Negative   Protein,UA Negative Negative/Trace   Glucose, UA Negative Negative   Ketones, UA Negative Negative   RBC, UA 1+ (A) Negative   Bilirubin, UA Negative Negative   Urobilinogen, Ur 0.2 0.2 - 1.0 mg/dL   Nitrite, UA Negative Negative   Microscopic Examination See below:    Microscopic Examination     Status: Abnormal   Collection Time: 11/08/19 11:08 AM   URINE  Result Value Ref Range   WBC, UA 0-5 0 - 5 /hpf   RBC 3-10 (A) 0 - 2 /hpf   Epithelial Cells (non renal) None seen 0 - 10 /hpf   Bacteria, UA None seen None seen/Few  BLADDER SCAN AMB  NON-IMAGING     Status: None   Collection Time: 11/08/19 11:22 AM  Result Value Ref Range   Scan Result 4   Surgical pathology     Status: None   Collection Time: 11/09/19  9:59 AM  Result Value Ref Range   SURGICAL PATHOLOGY      SURGICAL PATHOLOGY CASE: [id number] PATIENT: [name] Surgical Pathology Report     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 LUNG, LEFT LOWER LOBE, WEDGE RESECTION: - Squamous cell carcinoma, 1.6 cm. - Margins not involved. - Visceral pleura not involved. - See oncology table.  B. LYMPH NODE, LEVEL 9, EXCISION: - Anthracotic lymph node with no metastatic carcinoma.  C. LYMPH NODE, LEVEL 9 #2, EXCISION: - Anthracotic lymph node with no metastatic carcinoma.  D. LYMPH NODE, LEVEL 7, EXCISION: - Anthracotic lymph node with no metastatic carcinoma.  E. LYMPH NODE, LEVEL 10, EXCISION: - Anthracotic lymph node with no metastatic carcinoma.  F. LYMPH NODE, LEVEL 5, EXCISION: - Anthracotic lymph node with no metastatic carcinoma.  G. LUNG, LEFT LOWER LOBE #2, WEDGE RESECTION: - Lung lobe with previous wedge site. - No residual carcinoma. - Final margins clear.  ONCOLOGY TABLE:  LUNG:  Resection Procedure: Left lower wedge, lymph node biopsies and the left lower lobectomy. Specimen Laterality: Left. Tumor Site: Left lower lobe. Tumor Size: 1.6 x 1.5 cm. Tumor Focality: Unifocal. Histologic Type: Squamous cell carcinoma. Visceral Pleura Invasion: Not present. Lymphovascular Invasion: Not identified. Direct Invasion of Adjacent Structures: No adjacent structures submitted. Margins: Free of  tumor. Treatment Effect: No known presurgical therapy. Regional Lymph Nodes:      Number of Lymph Nodes Involved: 0      Number of Lymph Nodes Examined: 5 Pathologic Stage Classification (pTNM, AJCC 8th Edition): pT1b, pN0 Ancillary Studies: Can be performed if requested. Representative Tumor Block: A2 and A3. Comment(s): Immunohistochemistry show the tumor is positive with p40, p63, cytokeratin 5/6 and is negative with TTF-1 and Napsin-A consistent with squamous cell carcinoma. (v4.1.0.1)  INTRAOPERATIVE DIAGNOSIS: A.  Left lower lobe lung wedge-freeze marg ins only: "Negative for squamous cell carcinoma" Intraoperative diagnosis rendered by Dr. Jeannie Done at 11:10 AM on 11/09/2019.  GROSS DESCRIPTION: A: The specimen is received fresh for rapid intraoperative consultation and consists of a 5.9 x 3.0 x 2.3 cm, 15 g wedge of lung parenchyma with multiple staple lines.  The serosal surface is red-purple with moderate anthracosis.  A 1.6 x 1.5 cm tan-gray, firm stellate area is identified on the pleural surface, and is inked black.  The closest resection margin is submitted for frozen section analysis, the specimen is serially sectioned to reveal a 1.8 x 1.6 x 1.1 cm tan-gray, firm, ill-defined lesion.  Lesion abuts the inked pleural surface, measures approximately 1.2 cm from the stapled resection margin.  The uninvolved parenchyma is spongy red-brown.  Representative sections are submitted in 4 cassettes. 1 = margin frozen section remnant 2 and 3 = representative sections of lesion 4 = uninvolved parenchyma B: The specimen  is received in saline and consists of a 0.6 cm tan-gray possible lymph node.  The specimen is entirely submitted in 1 cassette. C: The specimen is received in saline and consists of a 1.5 x 0.6 x 0.3 cm aggregate of tan-gray, anthracotic soft tissue.  The specimen is entirely submitted in 1 cassette. D: The specimen is received in saline and consists of a 1.5 x  0.7 x  0.3 cm tan-gray, anthracotic possible lymph node.  The specimen is entirely submitted in 1 cassette. E: The specimen is received in saline and consists of a 1.0 x 0.8 0.4 cm aggregate of tan-gray, anthracotic soft tissue.  The specimen is entirely submitted in 1 cassette. F: The specimen is received fresh and consists of a 1.9 x 1.4 x 0.3 cm aggregate of tan-gray, anthracotic soft tissue and fat.  The specimen is entirely submitted in 1 cassette. G: The specimen is received fresh and consists of a 9.2 x 5.1 x 1.9 cm, 28 g wedge of lung parenchyma with multiple staple lines.  The pleural surface is red to gr ay-purple with moderate f.  There is a 1.7 x 1.5 cm tan-gray, focally disrupted area noted on the pleural surface.  The area surrounding the defect is inked black, the specimen is serially sectioned to reveal spongy red-brown parenchyma.  No lesions are grossly identified.  Representative sections are submitted in 3 cassettes, to include the pleural surface defect. Craig Staggers 11/11/2019)  Final Diagnosis performed by Claudette Laws, MD.   Electronically signed 11/11/2019 Technical component performed at [employer]. [facility], [place] [address], [place], [place] [address].  Professional component performed at [facility], [place] [address] [place], [place] [address].  Immunohistochemistry Technical component (if applicable) was performed at [facility]. [address] [place], [place], [place] [address].   IMMUNOHISTOCHEMISTRY DISCLAIMER (if applicable): Some of these immunohistochemical stains may have been developed and the performance  characteristics determine by [facility]. Some may not have been cleared or approved by the U.S. Food and Drug Administration. The FDA has determined that such clearance or approval is not necessary. This test is used for clinical purposes. It should not be regarded as investigational or for  research. This laboratory is certified under the [place] (CLIA-88) as qualified to perform high complexity clinical laboratory testing.  The controls stained appropriately.   Glucose, capillary     Status: Abnormal   Collection Time: 11/09/19  5:55 PM  Result Value Ref Range   Glucose-Capillary 125 (H) 70 - 99 mg/dL    Comment: Glucose reference range applies only to samples taken after fasting for at least 8 hours.   Comment 1 Notify RN    Comment 2 Document in Chart   Glucose, capillary     Status: Abnormal   Collection Time: 11/10/19 12:19 AM  Result Value Ref Range   Glucose-Capillary 121 (H) 70 - 99 mg/dL    Comment: Glucose reference range applies only to samples taken after fasting for at least 8 hours.  CBC     Status: Abnormal   Collection Time: 11/10/19  4:44 AM  Result Value Ref Range   WBC 11.1 (H) 4.0 - 10.5 K/uL   RBC 3.23 (L) 4.22 - 5.81 MIL/uL   Hemoglobin 10.8 (L) 13.0 - 17.0 g/dL   HCT 33.3 (L) 39.0 - 52.0 %   MCV 103.1 (H) 80.0 - 100.0 fL   MCH 33.4 26.0 - 34.0 pg   MCHC 32.4 30.0 - 36.0 g/dL   RDW 14.5 11.5 - 15.5 %   Platelets 208 150 - 400 K/uL   nRBC 0.0 0.0 - 0.2 %    Comment: Performed at [facility], [place] [address] [place], [place] [address]  Basic metabolic panel     Status: Abnormal   Collection Time: 11/10/19  4:44 AM  Result Value Ref Range  Sodium 140 135 - 145 mmol/L   Potassium 4.4 3.5 - 5.1 mmol/L   Chloride 111 98 - 111 mmol/L   CO2 23 22 - 32 mmol/L   Glucose, Bld 130 (H) 70 - 99 mg/dL    Comment: Glucose reference range applies only to samples taken after fasting for at least 8 hours.   BUN 17 8 - 23 mg/dL   Creatinine, Ser 1.23 0.61 - 1.24 mg/dL   Calcium 8.2 (L) 8.9 - 10.3 mg/dL   GFR calc non Af Amer 58 (L) >60 mL/min   GFR calc Af Amer >60 >60 mL/min   Anion gap 6 5 - 15    Comment: Performed at [facility] [address], [place], [place] [address]  Glucose, capillary      Status: Abnormal   Collection Time: 11/10/19  6:05 AM  Result Value Ref Range   Glucose-Capillary 106 (H) 70 - 99 mg/dL    Comment: Glucose reference range applies only to samples taken after fasting for at least 8 hours.  CBC     Status: Abnormal   Collection Time: 11/11/19  6:15 AM  Result Value Ref Range   WBC 9.0 4.0 - 10.5 K/uL   RBC 2.88 (L) 4.22 - 5.81 MIL/uL   Hemoglobin 9.6 (L) 13.0 - 17.0 g/dL   HCT 30.1 (L) 39.0 - 52.0 %   MCV 104.5 (H) 80.0 - 100.0 fL   MCH 33.3 26.0 - 34.0 pg   MCHC 31.9 30.0 - 36.0 g/dL   RDW 14.4 11.5 - 15.5 %   Platelets 171 150 - 400 K/uL   nRBC 0.0 0.0 - 0.2 %    Comment: Performed at [facility], [place] [address], [place], [place] [address]  Comprehensive metabolic panel     Status: Abnormal   Collection Time: 11/11/19  6:15 AM  Result Value Ref Range   Sodium 137 135 - 145 mmol/L   Potassium 4.1 3.5 - 5.1 mmol/L   Chloride 105 98 - 111 mmol/L   CO2 24 22 - 32 mmol/L   Glucose, Bld 99 70 - 99 mg/dL    Comment: Glucose reference range applies only to samples taken after fasting for at least 8 hours.   BUN 22 8 - 23 mg/dL   Creatinine, Ser 1.33 (H) 0.61 - 1.24 mg/dL   Calcium 8.1 (L) 8.9 - 10.3 mg/dL   Total Protein 5.5 (L) 6.5 - 8.1 g/dL   Albumin 3.0 (L) 3.5 - 5.0 g/dL   AST 22 15 - 41 U/L   ALT 12 0 - 44 U/L   Alkaline Phosphatase 53 38 - 126 U/L   Total Bilirubin 0.6 0.3 - 1.2 mg/dL   GFR calc non Af Amer 53 (L) >60 mL/min   GFR calc Af Amer >60 >60 mL/min   Anion gap 8 5 - 15    Comment: Performed at [facility] [address] [place], [place] [address]  Magnesium     Status: None   Collection Time: 11/11/19  4:55 PM  Result Value Ref Range   Magnesium 2.0 1.7 - 2.4 mg/dL    Comment: Performed at [facility] [address] [place], [place] [address]  Phosphorus     Status: Abnormal   Collection Time: 11/11/19  4:55 PM  Result Value Ref Range   Phosphorus 1.8 (L) 2.5 - 4.6 mg/dL    Comment: Performed at  [facility]  [address], [place], [place] [address]  Basic metabolic panel     Status: Abnormal   Collection Time: 11/12/19  5:50 AM  Result Value Ref Range   Sodium 140 135 - 145 mmol/L   Potassium 3.9 3.5 - 5.1 mmol/L   Chloride 109 98 - 111 mmol/L   CO2 25 22 - 32 mmol/L   Glucose, Bld 106 (H) 70 - 99 mg/dL    Comment: Glucose reference range applies only to samples taken after fasting for at least 8 hours.   BUN 16 8 - 23 mg/dL   Creatinine, Ser 1.22 0.61 - 1.24 mg/dL   Calcium 8.3 (L) 8.9 - 10.3 mg/dL   GFR calc non Af Amer 58 (L) >60 mL/min   GFR calc Af Amer >60 >60 mL/min   Anion gap 6 5 - 15    Comment: Performed at [facility] [address], [place], [place] [address]  Basic metabolic panel     Status: Abnormal   Collection Time: 11/13/19  3:12 AM  Result Value Ref Range   Sodium 138 135 - 145 mmol/L   Potassium 4.1 3.5 - 5.1 mmol/L   Chloride 107 98 - 111 mmol/L   CO2 25 22 - 32 mmol/L   Glucose, Bld 112 (H) 70 - 99 mg/dL    Comment: Glucose reference range applies only to samples taken after fasting for at least 8 hours.   BUN 15 8 - 23 mg/dL   Creatinine, Ser 1.16 0.61 - 1.24 mg/dL   Calcium 8.5 (L) 8.9 - 10.3 mg/dL   GFR calc non Af Amer >60 >60 mL/min   GFR calc Af Amer >60 >60 mL/min   Anion gap 6 5 - 15    Comment: Performed at [facility] [address] [place], [place] [address]   Objective  Body mass index is 35.8 kg/m. Wt Readings from Last 3 Encounters:  11/15/19 264 lb (119.7 kg)  11/13/19 253 lb 1.4 oz (114.8 kg)  11/08/19 266 lb (120.7 kg)   Temp Readings from Last 3 Encounters:  11/15/19 (!) 97.4 F (36.3 C) (Temporal)  11/14/19 97.7 F (36.5 C) (Oral)  11/07/19 97.7 F (36.5 C) (Oral)   BP Readings from Last 3 Encounters:  11/15/19 102/70  11/14/19 108/65  11/08/19 111/78   Pulse Readings from Last 3 Encounters:  11/15/19 87  11/14/19 73  11/08/19 82    Physical Exam Vitals and nursing note reviewed.   Constitutional:      Appearance: Normal appearance. He is well-developed and well-groomed. He is obese.  HENT:     Head: Normocephalic and atraumatic.  Eyes:     Conjunctiva/sclera: Conjunctivae normal.     Pupils: Pupils are equal, round, and reactive to light.  Cardiovascular:     Rate and Rhythm: Normal rate and regular rhythm.     Heart sounds: Normal heart sounds. No murmur.  Pulmonary:     Effort: Pulmonary effort is normal.     Breath sounds: Normal breath sounds.  Skin:    General: Skin is warm and dry.     Comments: Scars to left flank with scabs s/p surgery    Neurological:     General: No focal deficit present.     Mental Status: He is alert and oriented to person, place, and time. Mental status is at baseline.     Comments: Physical deconditioning walking with walker  Psychiatric:        Attention and Perception:  Attention and perception normal.        Mood and Affect: Mood and affect normal.        Speech: Speech normal.        Behavior: Behavior normal. Behavior is cooperative.        Thought Content: Thought content normal.        Cognition and Memory: Cognition and memory normal.        Judgment: Judgment normal.     Assessment  Plan  Left-sided chest pain - Plan: oxyCODONE (OXY IR/ROXICODONE) 5 MG immediate release tablet Q6 prn to start 11/21/19  HAP (hospital-acquired pneumonia) - Plan: levofloxacin (LEVAQUIN) 750 MG tablet x 1 week  Open wound left flank - Plan: mupirocin ointment (BACTROBAN) 2 %  Malignant neoplasm of left lung, unspecified part of lung (HCC) (SCC) F/u CTS, H/o in future  Will CC Dr. Patsey Berthold as well with h/o COPD  Anxiety and depression - Plan: Ambulatory referral to Psychiatry [facility] continue of care  Physical deconditioning PT/H/H RN for now   Urinary incontinence likely due to deconditioning  Will have pt f/u urology prn   HM Flu shot utd  prevnar utd/ pna 23 due in 1 year 04/14/20  Tdap consider future  shingrix consider  future  covid vx per pt had 2/2 need documentation   COPD + Smoker 1 ppd x 55 years as of 07/2019 and had quit x 2 years in the past  H/o hep C tx'ed [facility] last 11/13/15 not detected quant will recheck with hep A immune 09/21/14/hep B >1000 09/21/14 and sAg negative  Alcohol abuse drinks 1 pt liquor qd  H/o prostate cancer s/p brachytx with seeds PSA 05/18/19 0.22 f/u Dr.Brandon urology 10/2019 and Dr. Massie Maroon rad/onc Skin-examine at f/u   Colonoscopy none on file had [facility] 12/22/14 consider in future vs cologuard if qualifies   Neurology Dr. Manuella Ghazi f/u appt 01/06/20  Cards Dr. Fletcher Anon  [facility] psych   Provider: Dr. Olivia Mackie McLean-Scocuzza-Internal Medicine

## 2019-11-15 NOTE — Telephone Encounter (Signed)
-----   Message from Melrose Nakayama, MD sent at 11/15/2019  4:59 PM EDT ----- I saw that CXR before he was discharged, he has a minimal effusion and some atelectasis from the surgery. If he looks volume overloaded you could put him on lasix. He was having a fair amount of shortness of breath postop, mainly because he wasn't getting up enough and wasn't using his incentive spirometer enough. If he is having new shortness of breath or fevers, he needs to come to our office.   Richardson Landry  ----- Message ----- From: McLean-Scocuzza, [name], MD Sent: 11/15/2019   3:51 PM EDT To: Melrose Nakayama, MD  Pt has abnormal CXR 11/14/19 with mild to moderate pleural effusion and pneumonia Im starting Abx  Do you need to see him before 11/29/19 He is also having SOB on 3L O2

## 2019-11-15 NOTE — Patient Instructions (Addendum)
telfa and paper tape on wounds and clean daily with antibacterial soap  For constipation Colace as needed stool softner +/-Miralax=laxative    Healthcare-Associated Pneumonia  Healthcare-associated pneumonia is a lung infection that a person can get when in a health care setting or during certain procedures. The infection causes air sacs inside the lungs to fill with pus or fluid. Healthcare-associated pneumonia is usually caused by bacteria that are common in health care settings. These bacteria may be resistant to some antibiotic medicines. What are the causes? This condition is caused by bacteria that get into your lungs. You can get this condition if you:  Breathe in droplets from an infected person's cough or sneeze.  Touch something that an infected person coughed or sneezed on and then touch your mouth, nose, or eyes.  Have a bacterial infection somewhere else in your body, if the bacteria spread to your lungs through your blood. What increases the risk? This condition is more likely to develop in people who:  Have a disease that weakens their body's defense system (immune system) or their ability to cough out germs.  Are older than age 83.  Having trouble swallowing.  Use a feeding or breathing tube.  Have a cold or the flu.  Have an IV tube inserted in a vein.  Have surgery.  Have a bed sore.  Live in a long-term care facility, such as a nursing home.  Were in the hospital for two or more days in the past 3 months.  Received hemodialysis in the past 30 days. What are the signs or symptoms? Symptoms of this condition include:  Fever.  Chills.  Cough.  Shortness of breath.  Wheezing or crackling sounds when breathing. How is this diagnosed? This condition may be diagnosed based on:  Your symptoms.  A chest X-ray.  A measurement of the amount of oxygen in your blood. How is this treated? This condition is treated with antibiotics. Your health care  provider may take a sample of cells (culture) from your throat to determine what type of bacteria is in your lungs and change your antibiotic based on the results. If you have bacteria in your blood, trouble breathing, or a low oxygen level, you may need to be treated at the hospital. At the hospital, you will be given antibiotics through an IV tube. You may also be given oxygen or breathing treatments. Follow these instructions at home: Medicine  Take your antibiotic medicine as told by your health care provider. Do not stop taking the antibiotic even if you start to feel better.  Take over-the-counter and other prescription medicines only as told by your health care provider. Activity  Rest at home until you feel better.  Return to your normal activities as told by your health care provider. Ask your health care provider what activities are safe for you. General instructions   Drink enough fluid to keep your urine clear or pale yellow.  Do not use any products that contain nicotine or tobacco, such as cigarettes and e-cigarettes. If you need help quitting, ask your health care provider.  Limit alcohol intake to no more than 1 drink per day for nonpregnant women and 2 drinks per day for men. One drink equals 12 oz of beer, 5 oz of wine, or 1 oz of hard liquor.  Keep all follow-up visits as told by your health care provider. This is important. How is this prevented? Actions that I can take To lower your risk of getting this  condition again:  Do not smoke. This includes e-cigarettes.  Do not drink too much alcohol.  Keep your immune system healthy by eating well and getting enough sleep.  Get a flu shot every year (annually).  Get a pneumonia vaccination if: ? You are older than age 75. ? You smoke. ? You have a long-lasting condition like lung disease.  Exercise your lungs by taking deep breaths, walking, and using an incentive spirometer as directed.  Wash your hands often  with soap and water. If you cannot get to a sink to wash your hands, use an alcohol-based hand cleaner.  Make sure your health care providers are washing their hands. If you do not see them wash their hands, ask them to do so.  When you are in a health care facility, avoid touching your eyes, nose, and mouth.  Avoid touching any surface near where people have coughed or sneezed.  Stand away from sick people when they are coughing or sneezing.  Wear a mask if you cannot avoid exposure to people who are sick.  Clean all surfaces often with a disinfectant cleaner, especially if someone is sick at home or work.  Precautions of my health care team Hospitals, nursing homes, and other health care facilities take special care to try to prevent healthcare-associated pneumonia. To do this, your health care team may:  Clean their hands with soap and water or with alcohol-based hand sanitizer before and after seeing patients.  Wear gloves or masks during treatment.  Sanitize medical instruments, tubes, other equipment, and surfaces in patient rooms.  Raise (elevate) the head of your hospital bed so you are not lying flat. The head of the bed may be elevated 30 degrees or more.  Have you sit up and move around as soon as possible after surgery.  Only insert a breathing tube if needed.  Do these things for you if you have a breathing tube: ? Clean the inside of your mouth regularly. ? Remove the breathing tube as soon as it is no longer needed. Contact a health care provider if:  Your symptoms do not get better or they get worse.  Your symptoms come back after you have finished taking your antibiotics. Get help right away if:  You have trouble breathing.  You have confusion or difficulty thinking. This information is not intended to replace advice given to you by your health care provider. Make sure you discuss any questions you have with your health care provider. Document Revised:  06/26/2017 Document Reviewed: 04/11/2016 Elsevier Patient Education  2020 [employer].

## 2019-11-16 DIAGNOSIS — C3490 Malignant neoplasm of unspecified part of unspecified bronchus or lung: Secondary | ICD-10-CM | POA: Diagnosis not present

## 2019-11-16 DIAGNOSIS — J449 Chronic obstructive pulmonary disease, unspecified: Secondary | ICD-10-CM | POA: Diagnosis not present

## 2019-11-16 DIAGNOSIS — I2510 Atherosclerotic heart disease of native coronary artery without angina pectoris: Secondary | ICD-10-CM | POA: Diagnosis not present

## 2019-11-16 NOTE — Telephone Encounter (Signed)
Patient's daughter informed and verbalized understanding.   She asked her father while I was on the phone about lasix and pt states he is taking this. They will be sure to have pt use the incentive spirometry.

## 2019-11-17 ENCOUNTER — Other Ambulatory Visit: Payer: Self-pay | Admitting: *Deleted

## 2019-11-17 ENCOUNTER — Ambulatory Visit (INDEPENDENT_AMBULATORY_CARE_PROVIDER_SITE_OTHER): Payer: Self-pay

## 2019-11-17 ENCOUNTER — Other Ambulatory Visit: Payer: Self-pay

## 2019-11-17 DIAGNOSIS — Z4802 Encounter for removal of sutures: Secondary | ICD-10-CM

## 2019-11-17 NOTE — Progress Notes (Signed)
Removed 2 sutures from incision sites with no signs of infection and patient tolerate well.

## 2019-11-17 NOTE — Progress Notes (Signed)
The proposed treatment discussed in cancer conference 11/17/19 is for discussion purpose only and is not a binding recommendation.  The patient was not physically examined nor present for their treatment options.  Therefore, final treatment plans cannot be decided.

## 2019-11-18 ENCOUNTER — Telehealth: Payer: Self-pay | Admitting: Internal Medicine

## 2019-11-18 DIAGNOSIS — Z483 Aftercare following surgery for neoplasm: Secondary | ICD-10-CM | POA: Diagnosis not present

## 2019-11-18 DIAGNOSIS — M150 Primary generalized (osteo)arthritis: Secondary | ICD-10-CM | POA: Diagnosis not present

## 2019-11-18 DIAGNOSIS — E785 Hyperlipidemia, unspecified: Secondary | ICD-10-CM | POA: Diagnosis not present

## 2019-11-18 DIAGNOSIS — K219 Gastro-esophageal reflux disease without esophagitis: Secondary | ICD-10-CM | POA: Diagnosis not present

## 2019-11-18 DIAGNOSIS — Z9981 Dependence on supplemental oxygen: Secondary | ICD-10-CM | POA: Diagnosis not present

## 2019-11-18 DIAGNOSIS — I10 Essential (primary) hypertension: Secondary | ICD-10-CM | POA: Diagnosis not present

## 2019-11-18 DIAGNOSIS — F0390 Unspecified dementia without behavioral disturbance: Secondary | ICD-10-CM | POA: Diagnosis not present

## 2019-11-18 DIAGNOSIS — C3432 Malignant neoplasm of lower lobe, left bronchus or lung: Secondary | ICD-10-CM | POA: Diagnosis not present

## 2019-11-18 DIAGNOSIS — J439 Emphysema, unspecified: Secondary | ICD-10-CM | POA: Diagnosis not present

## 2019-11-18 DIAGNOSIS — I340 Nonrheumatic mitral (valve) insufficiency: Secondary | ICD-10-CM | POA: Diagnosis not present

## 2019-11-18 NOTE — Telephone Encounter (Signed)
Faxed Prescriber Response form to [facility]

## 2019-11-21 ENCOUNTER — Telehealth: Payer: Self-pay | Admitting: Internal Medicine

## 2019-11-21 NOTE — Telephone Encounter (Signed)
Christopher Orr from [facility] [phone]  Verbal Orders fro [facility]  1x a week for 1 week 2x a week for 2 weeks 4x a week for 4 weeks

## 2019-11-22 ENCOUNTER — Inpatient Hospital Stay: Payer: Medicare HMO | Attending: Oncology | Admitting: Oncology

## 2019-11-22 ENCOUNTER — Encounter: Payer: Self-pay | Admitting: *Deleted

## 2019-11-22 ENCOUNTER — Encounter: Payer: Self-pay | Admitting: Oncology

## 2019-11-22 ENCOUNTER — Other Ambulatory Visit: Payer: Self-pay

## 2019-11-22 VITALS — BP 99/68 | HR 82 | Temp 98.0°F | Resp 16 | Wt 268.0 lb

## 2019-11-22 DIAGNOSIS — C3432 Malignant neoplasm of lower lobe, left bronchus or lung: Secondary | ICD-10-CM | POA: Insufficient documentation

## 2019-11-22 DIAGNOSIS — I2510 Atherosclerotic heart disease of native coronary artery without angina pectoris: Secondary | ICD-10-CM | POA: Diagnosis not present

## 2019-11-22 DIAGNOSIS — D539 Nutritional anemia, unspecified: Secondary | ICD-10-CM

## 2019-11-22 DIAGNOSIS — I110 Hypertensive heart disease with heart failure: Secondary | ICD-10-CM | POA: Diagnosis not present

## 2019-11-22 DIAGNOSIS — D649 Anemia, unspecified: Secondary | ICD-10-CM | POA: Insufficient documentation

## 2019-11-22 DIAGNOSIS — J449 Chronic obstructive pulmonary disease, unspecified: Secondary | ICD-10-CM | POA: Insufficient documentation

## 2019-11-22 DIAGNOSIS — Z7189 Other specified counseling: Secondary | ICD-10-CM

## 2019-11-22 DIAGNOSIS — C3492 Malignant neoplasm of unspecified part of left bronchus or lung: Secondary | ICD-10-CM

## 2019-11-22 NOTE — Telephone Encounter (Signed)
Verbal has ben given

## 2019-11-22 NOTE — Progress Notes (Signed)
Pt weak from surgery. He has appt with surgeon 5/4. He is eating but not good. Drinking water. Gave him samples of boost and coupon. Sat 95 % on 3liters. B/p low today per pt. And some days he does have dizziness with movement. Her to go over pathology results

## 2019-11-23 DIAGNOSIS — E785 Hyperlipidemia, unspecified: Secondary | ICD-10-CM | POA: Diagnosis not present

## 2019-11-23 DIAGNOSIS — F0390 Unspecified dementia without behavioral disturbance: Secondary | ICD-10-CM | POA: Diagnosis not present

## 2019-11-23 DIAGNOSIS — J439 Emphysema, unspecified: Secondary | ICD-10-CM | POA: Diagnosis not present

## 2019-11-23 DIAGNOSIS — Z483 Aftercare following surgery for neoplasm: Secondary | ICD-10-CM | POA: Diagnosis not present

## 2019-11-23 DIAGNOSIS — M150 Primary generalized (osteo)arthritis: Secondary | ICD-10-CM | POA: Diagnosis not present

## 2019-11-23 DIAGNOSIS — K219 Gastro-esophageal reflux disease without esophagitis: Secondary | ICD-10-CM | POA: Diagnosis not present

## 2019-11-23 DIAGNOSIS — I10 Essential (primary) hypertension: Secondary | ICD-10-CM | POA: Diagnosis not present

## 2019-11-23 DIAGNOSIS — C3432 Malignant neoplasm of lower lobe, left bronchus or lung: Secondary | ICD-10-CM | POA: Diagnosis not present

## 2019-11-23 DIAGNOSIS — I340 Nonrheumatic mitral (valve) insufficiency: Secondary | ICD-10-CM | POA: Diagnosis not present

## 2019-11-23 NOTE — Progress Notes (Signed)
  Oncology Nurse Navigator Documentation  Navigator Location: [facility] (11/23/19 0800)   )Navigator Encounter Type: Appt/Treatment Plan Review (11/23/19 0800)                     Patient Visit Type: Follow-up;MedOnc (11/23/19 0800) Treatment Phase: Post-Tx Follow-up (11/23/19 0800) Barriers/Navigation Needs: No Barriers At This Time (11/23/19 0800)   Interventions: None Required (11/23/19 0800)                      Time Spent with Patient: 15 (11/23/19 0800)

## 2019-11-25 DIAGNOSIS — Z9981 Dependence on supplemental oxygen: Secondary | ICD-10-CM | POA: Diagnosis not present

## 2019-11-25 DIAGNOSIS — K219 Gastro-esophageal reflux disease without esophagitis: Secondary | ICD-10-CM | POA: Diagnosis not present

## 2019-11-25 DIAGNOSIS — C3432 Malignant neoplasm of lower lobe, left bronchus or lung: Secondary | ICD-10-CM | POA: Diagnosis not present

## 2019-11-25 DIAGNOSIS — E785 Hyperlipidemia, unspecified: Secondary | ICD-10-CM | POA: Diagnosis not present

## 2019-11-25 DIAGNOSIS — J439 Emphysema, unspecified: Secondary | ICD-10-CM | POA: Diagnosis not present

## 2019-11-25 DIAGNOSIS — M150 Primary generalized (osteo)arthritis: Secondary | ICD-10-CM | POA: Diagnosis not present

## 2019-11-25 DIAGNOSIS — I10 Essential (primary) hypertension: Secondary | ICD-10-CM | POA: Diagnosis not present

## 2019-11-25 DIAGNOSIS — J449 Chronic obstructive pulmonary disease, unspecified: Secondary | ICD-10-CM | POA: Diagnosis not present

## 2019-11-25 DIAGNOSIS — F0390 Unspecified dementia without behavioral disturbance: Secondary | ICD-10-CM | POA: Diagnosis not present

## 2019-11-25 DIAGNOSIS — I509 Heart failure, unspecified: Secondary | ICD-10-CM | POA: Diagnosis not present

## 2019-11-25 DIAGNOSIS — Z483 Aftercare following surgery for neoplasm: Secondary | ICD-10-CM | POA: Diagnosis not present

## 2019-11-25 DIAGNOSIS — I340 Nonrheumatic mitral (valve) insufficiency: Secondary | ICD-10-CM | POA: Diagnosis not present

## 2019-11-26 ENCOUNTER — Encounter: Payer: Self-pay | Admitting: Oncology

## 2019-11-26 DIAGNOSIS — C3492 Malignant neoplasm of unspecified part of left bronchus or lung: Secondary | ICD-10-CM | POA: Insufficient documentation

## 2019-11-26 DIAGNOSIS — Z7189 Other specified counseling: Secondary | ICD-10-CM | POA: Insufficient documentation

## 2019-11-26 HISTORY — DX: Malignant neoplasm of unspecified part of left bronchus or lung: C34.92

## 2019-11-26 NOTE — Progress Notes (Signed)
Hematology/Oncology Consult note [facility]  Telephone:([phone] [phone]  Patient Care Team: McLean-Scocuzza, [name], MD as PCP - General (Internal Medicine) Wellington Hampshire, MD as PCP - Cardiology (Cardiology) Telford Nab, RN as Oncology Nurse Navigator   Name of the patient: Christopher Orr  [id number]  [birth year]   Date of visit: 11/26/19  Diagnosis- Stage IA lung cancer s/p surgery  Chief complaint/ Reason for visit- discuss pathology results and further management  Heme/Onc history: Patient is a 74 year old male with past medical history significant for hypertension, hepatitis C, cirrhosis, COPD among other medical problems.  He is not on any baseline oxygen.  Patient underwent CT chest without contrast which showed a 1 cm left lower lobe pulmonary nodule concerning for bronchogenic carcinoma which was new as compared to his prior scan in 2006.  No thoracic adenopathy.   PET CT scan showed hypermetabolic left lower lobe lesion measuring 1.2 x 1.2 cm.  No evidence of intrathoracic adenopathy.  Also noted to have hypermetabolic lesion in the left parotid gland 1.7 x 2 cm.  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  Patient underwent wedge resection of the left lower lobe lung nodule which showed a 1.6 cm squamous cell carcinoma of.  No involvement of visceral.  Margins negative. 5 lymph nodes negative for malignancy.   Interval history-patient is still having a hard time recovering from his surgery.  He remains on oxygen.  He feels fatigued and easily winded with mild to moderate exertion.  ECOG PS- 2 Pain scale- 3   Review of systems- Review of Systems  Constitutional: Positive for malaise/fatigue. Negative for chills, fever and weight loss.  HENT: Negative for congestion, ear discharge and nosebleeds.   Eyes: Negative for blurred vision.  Respiratory: Positive for shortness of breath. Negative  for cough, hemoptysis, sputum production and wheezing.   Cardiovascular: Negative for chest pain, palpitations, orthopnea and claudication.  Gastrointestinal: Negative for abdominal pain, blood in stool, constipation, diarrhea, heartburn, melena, nausea and vomiting.  Genitourinary: Negative for dysuria, flank pain, frequency, hematuria and urgency.  Musculoskeletal: Negative for back pain, joint pain and myalgias.  Skin: Negative for rash.  Neurological: Negative for dizziness, tingling, focal weakness, seizures, weakness and headaches.  Endo/Heme/Allergies: Does not bruise/bleed easily.  Psychiatric/Behavioral: Negative for depression and suicidal ideas. The patient does not have insomnia.      No Known Allergies   Past Medical History:  Diagnosis Date  . Alcohol abuse    pt states it is marijuana  . Anemia   . Arthritis   . Bipolar disorder ([place])   . BPH (benign prostatic hypertrophy) with urinary obstruction   . CAD (coronary artery disease)    a. s/p stent to LAD in 2006 at [facility], EF 60% at that time.  . Cancer [facility])    prostate  . CHF (congestive heart failure) ([place])   . Chronic pancreatitis ([place])   . Cirrhosis ([place])   . COPD (chronic obstructive pulmonary disease) ([place])   . Dementia ([place])    early dementia  . Depression   . Diverticulosis   . Drug use   . Dyspnea   . ED (erectile dysfunction)   . Gastritis   . GERD (gastroesophageal reflux disease)   . Headache    occasional migraines  . Hepatitis C    treated  . History of lumbar fusion   . Hyperlipidemia   . Hypertension    patient denies  having high blood pressure  . Mitral regurgitation   . Pancreatitis    x 2   . Pneumonia    07/06/18   . PTSD (post-traumatic stress disorder)   . Rib fracture    s/p fall 03/15/19   . Sinus disease   . Urge incontinence      Past Surgical History:  Procedure Laterality Date  . CARDIAC CATHETERIZATION  2006  . cateract  2013  . COLONOSCOPY WITH PROPOFOL N/A  10/04/2019   Procedure: COLONOSCOPY WITH PROPOFOL;  Surgeon: Lucilla Lame, MD;  Location: [facility] ENDOSCOPY;  Service: Endoscopy;  Laterality: N/A;  . CORONARY ANGIOPLASTY  2006  . CORONARY STENT PLACEMENT  2006   x 1  . ESOPHAGOGASTRODUODENOSCOPY N/A 12/22/2014   Procedure: ESOPHAGOGASTRODUODENOSCOPY (EGD);  Surgeon: [name], MD;  Location: [facility] ENDOSCOPY;  Service: Endoscopy;  Laterality: N/A;  . ESOPHAGOGASTRODUODENOSCOPY (EGD) WITH PROPOFOL N/A 10/04/2019   Procedure: ESOPHAGOGASTRODUODENOSCOPY (EGD) WITH PROPOFOL;  Surgeon: Lucilla Lame, MD;  Location: [facility] ENDOSCOPY;  Service: Endoscopy;  Laterality: N/A;  . HERNIA REPAIR  2015   left groin  . INTERCOSTAL NERVE BLOCK Left 11/09/2019   Procedure: Intercostal Nerve Block;  Surgeon: Melrose Nakayama, MD;  Location: [place];  Service: Thoracic;  Laterality: Left;  . LUMBAR FUSION    . RADIOACTIVE SEED IMPLANT N/A 04/22/2016   Procedure: RADIOACTIVE SEED IMPLANT/BRACHYTHERAPY IMPLANT;  Surgeon: Hollice Espy, MD;  Location: [facility] ORS;  Service: Urology;  Laterality: N/A;  . ROBOT ASSISTED INGUINAL HERNIA REPAIR Bilateral 07/06/2018   Procedure: ROBOT ASSISTED INGUINAL HERNIA REPAIR;  Surgeon: Jules Husbands, MD;  Location: [facility] ORS;  Service: General;  Laterality: Bilateral;  . TONSILLECTOMY    . UMBILICAL HERNIA REPAIR N/A 07/06/2018   Procedure: LAPAROSCOPIC ROBOT ASSISTED UMBILICAL HERNIA;  Surgeon: Jules Husbands, MD;  Location: [facility] ORS;  Service: General;  Laterality: N/A;  . XI ROBOTIC ASSISTED THORACOSCOPY- SEGMENTECTOMY Left 11/09/2019   Procedure: XI ROBOTIC ASSISTED THORACOSCOPY-WEDGE RESECTION LEFT LOWER LOBE;  Surgeon: Melrose Nakayama, MD;  Location: [place];  Service: Thoracic;  Laterality: Left;    Social History   Socioeconomic History  . Marital status: Divorced    Spouse name: Not on file  . Number of children: 4  . Years of education: Not on file  . Highest education level: GED or equivalent  Occupational  History  . Occupation: Aeronautical engineer    Comment: retired  Tobacco Use  . Smoking status: Former Smoker    Types: Cigarettes    Start date: 07/28/1962    Quit date: 09/01/2019    Years since quitting: 0.2  . Smokeless tobacco: Never Used  Substance and Sexual Activity  . Alcohol use: Yes    Alcohol/week: 0.0 standard drinks    Comment: states he drinks a pint a day (11/07/19)   . Drug use: Yes    Frequency: 7.0 times per week    Types: Marijuana    Comment: Endorsed heroin 06/2014, UDS also + benzos, opiates, THC, neg for cocaine at that time.  Marland Kitchen Sexual activity: Not Currently  Other Topics Concern  . Not on file  Social History Narrative   Lives at home    Has kids and grandkids   Smoker x 55 years 1 ppd as of 07/2019 he did quit x 2 years    Drinks 1 pt liq qd as of 07/2019    Social Determinants of Health   Financial Resource Strain: Low Risk   . Difficulty of  Paying Living Expenses: Not very hard  Food Insecurity: Unknown  . Worried About Charity fundraiser in the Last Year: Never true  . Ran Out of Food in the Last Year: Not on file  Transportation Needs:   . Lack of Transportation (Medical):   Marland Kitchen Lack of Transportation (Non-Medical):   Physical Activity:   . Days of Exercise per Week:   . Minutes of Exercise per Session:   Stress:   . Feeling of Stress :   Social Connections:   . Frequency of Communication with Friends and Family:   . Frequency of Social Gatherings with Friends and Family:   . Attends Religious Services:   . Active Member of Clubs or Organizations:   . Attends Archivist Meetings:   Marland Kitchen Marital Status:   Intimate Partner Violence:   . Fear of Current or Ex-Partner:   . Emotionally Abused:   Marland Kitchen Physically Abused:   . Sexually Abused:     Family History  Problem Relation Age of Onset  . Heart disease Father        Unclear details. Father passed in late 103's 2/2 cancer.  . Colon cancer Father   . Alcohol abuse Sister   . Drug  abuse Sister   . Anxiety disorder Sister   . Depression Sister   . Kidney disease Neg Hx   . Prostate cancer Neg Hx      Current Outpatient Medications:  .  albuterol (PROVENTIL) (2.5 MG/3ML) 0.083% nebulizer solution, Take 3 mLs (2.5 mg total) by nebulization every 4 (four) hours as needed for wheezing or shortness of breath., Disp: 360 mL, Rfl: 3 .  albuterol (VENTOLIN HFA) 108 (90 Base) MCG/ACT inhaler, Inhale 1-2 puffs into the lungs every 6 (six) hours as needed for wheezing or shortness of breath., Disp: , Rfl:  .  alprazolam (XANAX) 1 MG tablet, Take 1 tablet (1 mg total) by mouth as directed. 1/2 in am 1 pill qhs, Disp: , Rfl:  .  ARIPiprazole (ABILIFY) 10 MG tablet, Take 10 mg by mouth daily., Disp: , Rfl:  .  aspirin EC 81 MG tablet, Take 81 mg by mouth daily., Disp: , Rfl:  .  budesonide-formoterol (SYMBICORT) 160-4.5 MCG/ACT inhaler, Inhale 2 puffs into the lungs 2 (two) times daily. Rinse mouth out, Disp: 1 Inhaler, Rfl: 1 .  celecoxib (CELEBREX) 200 MG capsule, Take 1 capsule (200 mg total) by mouth daily after breakfast., Disp: 90 capsule, Rfl: 0 .  clopidogrel (PLAVIX) 75 MG tablet, Take 1 tablet (75 mg total) by mouth daily. Reported on 01/15/2016, Disp: 90 tablet, Rfl: 3 .  donepezil (ARICEPT) 5 MG tablet, Take 1 tablet (5 mg total) by mouth at bedtime., Disp: 90 tablet, Rfl: 3 .  FLUoxetine (PROZAC) 20 MG capsule, Take 60 mg by mouth daily., Disp: , Rfl: 1 .  folic acid (FOLVITE) 1 MG tablet, TAKE 1 TABLET BY MOUTH EVERY DAY (Patient taking differently: Take 1 mg by mouth daily. ), Disp: 90 tablet, Rfl: 0 .  furosemide (LASIX) 40 MG tablet, Take 1 tablet (40 mg total) by mouth daily., Disp: 5 tablet, Rfl: 0 .  memantine (NAMENDA) 5 MG tablet, Take 1 tablet (5 mg total) by mouth 2 (two) times daily. Taking qd, Disp: 180 tablet, Rfl: 3 .  metoprolol succinate (TOPROL-XL) 25 MG 24 hr tablet, Take 1 tablet (25 mg total) by mouth daily., Disp: 90 tablet, Rfl: 3 .  Multiple  Vitamins-Minerals (MULTIVITAMIN WITH MINERALS) tablet, Take 1  tablet by mouth daily., Disp: , Rfl:  .  MYRBETRIQ 50 MG TB24 tablet, TAKE 1 TABLET BY MOUTH EVERY DAY (Patient taking differently: Take 50 mg by mouth daily. ), Disp: 90 tablet, Rfl: 0 .  oxyCODONE (OXY IR/ROXICODONE) 5 MG immediate release tablet, Take 1 tablet (5 mg total) by mouth every 6 (six) hours as needed for severe pain., Disp: 28 tablet, Rfl: 0 .  pantoprazole (PROTONIX) 40 MG tablet, Take 1 tablet (40 mg total) by mouth daily. 30 min before breakfast, Disp: 90 tablet, Rfl: 3 .  potassium chloride 20 MEQ TBCR, Take 20 mEq by mouth daily., Disp: 5 tablet, Rfl: 0 .  rosuvastatin (CRESTOR) 10 MG tablet, Take 1 tablet (10 mg total) by mouth at bedtime. (Patient taking differently: Take 10 mg by mouth daily. ), Disp: 90 tablet, Rfl: 3 .  tamsulosin (FLOMAX) 0.4 MG CAPS capsule, Take 1 capsule (0.4 mg total) by mouth daily after supper. (Patient taking differently: Take 0.4 mg by mouth daily. ), Disp: 90 capsule, Rfl: 3 .  traZODone (DESYREL) 150 MG tablet, Take 1 tablet (150 mg total) by mouth at bedtime., Disp: 90 tablet, Rfl: 3 .  mupirocin ointment (BACTROBAN) 2 %, Apply 1 application topically 2 (two) times daily. Left back (Patient not taking: Reported on 11/22/2019), Disp: 60 g, Rfl: 0  Physical exam:  Vitals:   11/22/19 1447  BP: 99/68  Pulse: 82  Resp: 16  Temp: 98 F (36.7 C)  TempSrc: Tympanic  Weight: 268 lb (121.6 kg)   Physical Exam Constitutional:      Comments: Patient appears fatigued.  He is on home oxygen  HENT:     Head: Normocephalic and atraumatic.  Eyes:     Pupils: Pupils are equal, round, and reactive to light.  Cardiovascular:     Rate and Rhythm: Normal rate and regular rhythm.     Heart sounds: Normal heart sounds.  Pulmonary:     Effort: Pulmonary effort is normal.     Comments: Breath sounds decreased over left lung base Abdominal:     General: Bowel sounds are normal.     Palpations:  Abdomen is soft.  Musculoskeletal:     Cervical back: Normal range of motion.  Skin:    General: Skin is warm and dry.  Neurological:     Mental Status: He is alert and oriented to person, place, and time.      CMP Latest Ref Rng & Units 11/13/2019  Glucose 70 - 99 mg/dL 112(H)  BUN 8 - 23 mg/dL 15  Creatinine 0.61 - 1.24 mg/dL 1.16  Sodium 135 - 145 mmol/L 138  Potassium 3.5 - 5.1 mmol/L 4.1  Chloride 98 - 111 mmol/L 107  CO2 22 - 32 mmol/L 25  Calcium 8.9 - 10.3 mg/dL 8.5(L)  Total Protein 6.5 - 8.1 g/dL -  Total Bilirubin 0.3 - 1.2 mg/dL -  Alkaline Phos 38 - 126 U/L -  AST 15 - 41 U/L -  ALT 0 - 44 U/L -   CBC Latest Ref Rng & Units 11/11/2019  WBC 4.0 - 10.5 K/uL 9.0  Hemoglobin 13.0 - 17.0 g/dL 9.6(L)  Hematocrit 39.0 - 52.0 % 30.1(L)  Platelets 150 - 400 K/uL 171    No images are attached to the encounter.  DG Chest 2 View  Result Date: 11/14/2019 CLINICAL DATA:  Shortness of breath. Status left lower lobe wedge resection 5 days ago. EXAM: CHEST - 2 VIEW COMPARISON:  11/13/2019 FINDINGS: Stable right  jugular catheter. Increased patchy and linear density in the left lower lung zone. Probable mild increase in a small to moderate-sized left pleural effusion. Interval resolved patchy density at the right lung base with a small amount of right basilar linear density remaining. The cardiac silhouette remains borderline enlarged. Thoracic spine degenerative changes. No pneumothorax seen. IMPRESSION: 1. Increased left basilar atelectasis and possible pneumonia. 2. Probable mild increase in a small to moderate-sized left pleural effusion. 3. Significantly improved right basilar atelectasis. Electronically Signed   By: Claudie Revering M.D.   On: 11/14/2019 11:43   DG Chest 2 View  Result Date: 11/07/2019 CLINICAL DATA:  COPD EXAM: CHEST - 2 VIEW COMPARISON:  CT 08/30/2019 FINDINGS: The heart size and mediastinal contours are within normal limits. Known posterior left lower lobe  pulmonary nodule is not well seen radiographically. Minimal bibasilar atelectasis. No new focal airspace consolidation, pleural effusion, or pneumothorax. The visualized skeletal structures are unremarkable. IMPRESSION: 1. No active cardiopulmonary disease. 2. Known posterior left lower lobe pulmonary nodule is not well seen radiographically. Electronically Signed   By: Davina Poke D.O.   On: 11/07/2019 15:12   DG CHEST PORT 1 VIEW  Result Date: 11/13/2019 CLINICAL DATA:  Dyspnea. EXAM: PORTABLE CHEST 1 VIEW COMPARISON:  11/12/2019 FINDINGS: Right jugular catheter tip in the superior vena cava. Stable borderline enlarged cardiac silhouette. Mild increase in prominence of the pulmonary vasculature and interstitial markings. Increased mild patchy opacities in the left lower lung zone. Mildly increased right basilar atelectasis. Decreased right pleural fluid and no significant change in a small to moderate-sized left pleural effusion. IMPRESSION: 1. Mildly progressive changes of congestive heart failure. 2. Increased mild alveolar edema or pneumonia in the left lower lung zone. 3. Mildly increased right basilar atelectasis. 4. Stable small to moderate-sized left pleural effusion. Electronically Signed   By: Claudie Revering M.D.   On: 11/13/2019 10:33   DG Chest Port 1 View  Result Date: 11/12/2019 CLINICAL DATA:  Status post partial lobectomy. EXAM: PORTABLE CHEST 1 VIEW COMPARISON:  November 11, 2019. FINDINGS: The heart size and mediastinal contours are within normal limits. No pneumothorax is noted. Stable bibasilar subsegmental atelectasis is noted. Stable position of right internal jugular catheter. Small left pleural effusion may be present. The visualized skeletal structures are unremarkable. IMPRESSION: Stable bibasilar subsegmental atelectasis. Small left pleural effusion may be present. Electronically Signed   By: Marijo Conception M.D.   On: 11/12/2019 08:55   DG Chest Port 1 View  Result Date:  11/11/2019 CLINICAL DATA:  74 year old male status post chest tube removal yesterday. EXAM: PORTABLE CHEST 1 VIEW COMPARISON:  Chest x-ray 11/10/2019. FINDINGS: There is a right-sided internal jugular central venous catheter with tip terminating in the mid superior vena cava. Lung volumes remain low. No pneumothorax. Bibasilar opacities have worsened, likely reflective of progressive subsegmental atelectasis. Small left pleural effusion. No right pleural effusion. No evidence of pulmonary edema. Heart size is normal. Upper mediastinal contours are within normal limits. Aortic atherosclerosis. IMPRESSION: 1. Support apparatus, as above. 2. Low lung volumes with worsening bibasilar subsegmental atelectasis. 3. Small left pleural effusion. 4. Aortic atherosclerosis. Electronically Signed   By: Vinnie Langton M.D.   On: 11/11/2019 09:28   DG Chest Port 1 View  Result Date: 11/10/2019 CLINICAL DATA:  Left-sided chest tube removal. EXAM: PORTABLE CHEST 1 VIEW COMPARISON:  Same day. FINDINGS: Stable cardiomegaly. No pneumothorax or significant pleural effusion is noted. Left-sided chest tube has been removed. Stable position of right  internal jugular catheter. Right lung is clear. Mild left basilar subsegmental atelectasis is noted. Bony thorax is unremarkable. IMPRESSION: No pneumothorax status post left-sided chest tube removal. Mild left basilar subsegmental atelectasis is noted. Electronically Signed   By: Marijo Conception M.D.   On: 11/10/2019 11:45   DG CHEST PORT 1 VIEW  Result Date: 11/10/2019 CLINICAL DATA:  Shortness of breath EXAM: PORTABLE CHEST 1 VIEW COMPARISON:  11/09/2019 FINDINGS: Left chest tube remains in place. No pneumothorax. Right central line unchanged. Improving aeration in the right lung. Bibasilar atelectasis or infiltrates. Mild cardiomegaly. Suspect small right effusion. No acute bony abnormality. Stable subcutaneous emphysema in the left neck. IMPRESSION: Left chest tube remains in  place.  No pneumothorax. Improved aeration in the right upper lobe. Bibasilar atelectasis, small right effusion. Electronically Signed   By: Rolm Baptise M.D.   On: 11/10/2019 07:50   DG Chest Port 1 View  Result Date: 11/09/2019 CLINICAL DATA:  74 year old male status post lung surgery with chest tube. Postoperative day zero left lower lobe wedge resection. EXAM: PORTABLE CHEST 1 VIEW COMPARISON:  Two-view chest radiographs 11/07/2019 and earlier. FINDINGS: Portable AP semi upright view at 1243 hours. Lower lung volumes. Left chest tube in place with no pneumothorax identified. Left chest wall and lower neck subcutaneous gas. No confluent opacity in the residual left lung. However there is new consolidation in the lateral aspect of the right upper lobe abutting the minor fissure. A right IJ central line is in place with tip at the SVC level. No right pneumothorax. Mediastinal contours appear stable. IMPRESSION: 1. New consolidation in the lateral right upper lobe, nonspecific but consider aspiration in this setting. 2. Postoperative changes to the left lung with chest tube in place and no adverse features. 3. Right IJ central line, tip at the SVC level.  No pneumothorax. Electronically Signed   By: Genevie Ann M.D.   On: 11/09/2019 14:40     Assessment and plan- Patient is a 74 y.o. male with stage IA pT1b pN0 cM0 squamous cell carcinoma of the right lobe status post wedge resection  I discussed the results of final pathology with the patient in detail.  Patient found to have a small 1.6 cm squamous cell carcinoma with negative margins and no evidence of invasion.  5 lymph nodes were negative for malignancy.  He therefore has stage Ia disease and would not benefit from adjuvant chemotherapy.  I will plan to surveillance imaging with repeat CT scans in 6 months time.  Treatment was given with a curative intent  I expect his performance status and fatigue as well as respiratory reserve to improve in the  coming weeks as he is just recovering from surgery.   On review of his recent labs he was noted to have a hemoglobin of 9.6 with an MCV of 104.  It is possible that this is postoperative anemia.  I will check CBC ferritin iron studies B12 and folate with next set of labs.  I will see him back in 3 months    Visit Diagnosis 1. Macrocytic anemia   2. Squamous carcinoma of lung, left ([place])   3. Goals of care, counseling/discussion      Dr. Randa Evens, MD, MPH [facility] [phone] 11/26/2019 2:56 PM

## 2019-11-29 ENCOUNTER — Ambulatory Visit
Admission: RE | Admit: 2019-11-29 | Discharge: 2019-11-29 | Disposition: A | Discharge status: Home / Self Care | Payer: Medicare HMO | Source: Ambulatory Visit | Attending: Thoracic Surgery (Cardiothoracic Vascular Surgery) | Admitting: Thoracic Surgery (Cardiothoracic Vascular Surgery)

## 2019-11-29 ENCOUNTER — Other Ambulatory Visit: Payer: Self-pay

## 2019-11-29 ENCOUNTER — Ambulatory Visit (INDEPENDENT_AMBULATORY_CARE_PROVIDER_SITE_OTHER): Payer: Self-pay | Admitting: Thoracic Surgery (Cardiothoracic Vascular Surgery)

## 2019-11-29 ENCOUNTER — Encounter: Payer: Self-pay | Admitting: Thoracic Surgery (Cardiothoracic Vascular Surgery)

## 2019-11-29 ENCOUNTER — Other Ambulatory Visit: Payer: Self-pay | Admitting: Thoracic Surgery (Cardiothoracic Vascular Surgery)

## 2019-11-29 DIAGNOSIS — C3490 Malignant neoplasm of unspecified part of unspecified bronchus or lung: Secondary | ICD-10-CM

## 2019-11-29 DIAGNOSIS — J90 Pleural effusion, not elsewhere classified: Secondary | ICD-10-CM | POA: Diagnosis not present

## 2019-11-29 DIAGNOSIS — R0602 Shortness of breath: Secondary | ICD-10-CM | POA: Diagnosis not present

## 2019-11-29 DIAGNOSIS — R079 Chest pain, unspecified: Secondary | ICD-10-CM

## 2019-11-29 MED ORDER — PREDNISONE 10 MG (21) PO TBPK: ORAL_TABLET | ORAL | 0 refills | Status: AC

## 2019-11-29 MED ORDER — OXYCODONE HCL 5 MG PO TABS: 5.0000 mg | ORAL_TABLET | Freq: Three times a day (TID) | ORAL | 0 refills | Status: DC | PRN

## 2019-11-29 NOTE — Progress Notes (Signed)
[address]       ,[place] [address]             [phone]     HPI: [name] returns for a scheduled postoperative follow-up visit  Christopher Orr is a 74 year old man with a complex past medical history including tobacco abuse, COPD, bipolar disorder, early dementia, ethanol abuse, hepatitis C, CAD, GERD, hypertension, hyperlipidemia, mild regurgitation, pancreatitis, posttraumatic stress disorder, and depression.  He had a robotic wedge resection and node dissection for a stage Ia squamous cell carcinoma on 11/09/2019.  Postoperatively he had some issues with confusion and possible withdrawal.  He also had issues with constipation.  He went home on 1 to 2 L of oxygen via nasal cannula.  About a week after discharge, he was treated presumptively for possible pneumonia with Levaquin.  His chest x-ray showed some atelectasis and possibly a small left effusion.  He continues to require oxygen.  He is currently using 3 L nasal cannula.  Overall he feels better.  He is not really having any pain at this point.  Past Medical History:  Diagnosis Date  . Alcohol abuse    pt states it is marijuana  . Anemia   . Arthritis   . Bipolar disorder ([place])   . BPH (benign prostatic hypertrophy) with urinary obstruction   . CAD (coronary artery disease)    a. s/p stent to LAD in 2006 at [facility] EF 60% at that time.  . Cancer [facility])    prostate  . CHF (congestive heart failure) ([place])   . Chronic pancreatitis ([employer])   . Cirrhosis ([place])   . COPD (chronic obstructive pulmonary disease) ([place])   . Dementia ([place])    early dementia  . Depression   . Diverticulosis   . Drug use   . Dyspnea   . ED (erectile dysfunction)   . Gastritis   . GERD (gastroesophageal reflux disease)   . Headache    occasional migraines  . Hepatitis C    treated  . History of lumbar fusion   . Hyperlipidemia   . Hypertension    patient denies having high blood pressure  . Mitral regurgitation   .  Pancreatitis    x 2   . Pneumonia    07/06/18   . PTSD (post-traumatic stress disorder)   . Rib fracture    s/p fall 03/15/19   . Sinus disease   . Urge incontinence     Current Outpatient Medications  Medication Sig Dispense Refill  . albuterol (PROVENTIL) (2.5 MG/3ML) 0.083% nebulizer solution Take 3 mLs (2.5 mg total) by nebulization every 4 (four) hours as needed for wheezing or shortness of breath. 360 mL 3  . albuterol (VENTOLIN HFA) 108 (90 Base) MCG/ACT inhaler Inhale 1-2 puffs into the lungs every 6 (six) hours as needed for wheezing or shortness of breath.    . alprazolam (XANAX) 1 MG tablet Take 1 tablet (1 mg total) by mouth as directed. 1/2 in am 1 pill qhs    . ARIPiprazole (ABILIFY) 10 MG tablet Take 10 mg by mouth daily.    Marland Kitchen aspirin EC 81 MG tablet Take 81 mg by mouth daily.    . budesonide-formoterol (SYMBICORT) 160-4.5 MCG/ACT inhaler Inhale 2 puffs into the lungs 2 (two) times daily. Rinse mouth out 1 Inhaler 1  . celecoxib (CELEBREX) 200 MG capsule Take 1 capsule (200 mg total) by mouth daily after breakfast. 90 capsule 0  .  clopidogrel (PLAVIX) 75 MG tablet Take 1 tablet (75 mg total) by mouth daily. Reported on 01/15/2016 90 tablet 3  . donepezil (ARICEPT) 5 MG tablet Take 1 tablet (5 mg total) by mouth at bedtime. 90 tablet 3  . FLUoxetine (PROZAC) 20 MG capsule Take 60 mg by mouth daily.  1  . folic acid (FOLVITE) 1 MG tablet TAKE 1 TABLET BY MOUTH EVERY DAY (Patient taking differently: Take 1 mg by mouth daily. ) 90 tablet 0  . furosemide (LASIX) 40 MG tablet Take 1 tablet (40 mg total) by mouth daily. 5 tablet 0  . memantine (NAMENDA) 5 MG tablet Take 1 tablet (5 mg total) by mouth 2 (two) times daily. Taking qd 180 tablet 3  . metoprolol succinate (TOPROL-XL) 25 MG 24 hr tablet Take 1 tablet (25 mg total) by mouth daily. 90 tablet 3  . Multiple Vitamins-Minerals (MULTIVITAMIN WITH MINERALS) tablet Take 1 tablet by mouth daily.    . mupirocin ointment (BACTROBAN)  2 % Apply 1 application topically 2 (two) times daily. Left back 60 g 0  . MYRBETRIQ 50 MG TB24 tablet TAKE 1 TABLET BY MOUTH EVERY DAY (Patient taking differently: Take 50 mg by mouth daily. ) 90 tablet 0  . oxyCODONE (OXY IR/ROXICODONE) 5 MG immediate release tablet Take 1 tablet (5 mg total) by mouth every 8 (eight) hours as needed for severe pain. 21 tablet 0  . pantoprazole (PROTONIX) 40 MG tablet Take 1 tablet (40 mg total) by mouth daily. 30 min before breakfast 90 tablet 3  . potassium chloride 20 MEQ TBCR Take 20 mEq by mouth daily. 5 tablet 0  . rosuvastatin (CRESTOR) 10 MG tablet Take 1 tablet (10 mg total) by mouth at bedtime. (Patient taking differently: Take 10 mg by mouth daily. ) 90 tablet 3  . tamsulosin (FLOMAX) 0.4 MG CAPS capsule Take 1 capsule (0.4 mg total) by mouth daily after supper. (Patient taking differently: Take 0.4 mg by mouth daily. ) 90 capsule 3  . traZODone (DESYREL) 150 MG tablet Take 1 tablet (150 mg total) by mouth at bedtime. 90 tablet 3  . predniSONE (STERAPRED UNI-PAK 21 TAB) 10 MG (21) TBPK tablet Take 5 tablets (50 mg total) by mouth daily for 1 day, THEN 4 tablets (40 mg total) daily for 1 day, THEN 3 tablets (30 mg total) daily for 1 day, THEN 2 tablets (20 mg total) daily for 1 day, THEN 1 tablet (10 mg total) daily for 1 day. 15 tablet 0   No current facility-administered medications for this visit.    Physical Exam BP 115/75 (BP Location: Left Arm, Patient Position: Sitting, Cuff Size: Normal)   Pulse 78   Temp 99 F (37.2 C) (Oral)   Resp (!) 24   Ht 6' (1.829 m)   Wt 265 lb (120.2 kg)   SpO2 91% Comment: on O2 at 3 LPM via nasal cannula  BMI 35.7 kg/m  74 year old man in no acute distress Alert and oriented x3 Lungs absent breath sounds and dullness to percussion at left base Cardiac regular rate and rhythm Incisions well-healed No peripheral edema  Diagnostic Tests: CHEST - 2 VIEW  COMPARISON:  November 14, 2019.  FINDINGS: There  is a fairly sizable pleural effusion on the left with consolidation in portions of the lingula and left lower lobe. Ill-defined opacity is noted in the lateral right base. There is mild cardiac enlargement with pulmonary vascularity normal. No adenopathy. There is degenerative change in the lumbar spine.  There is anterior wedging of lower thoracic vertebral bodies, stable.  IMPRESSION: Fairly sizable left pleural effusion with areas of consolidation in portions of the lingula and left lower lobe, at least in part due to atelectasis. Airspace consolidation or mass could easily be obscured given the degree of effusion in this area.  Ill-defined opacity in lateral right base is concerning for focus of potential pneumonia. Right lung otherwise clear.  Stable cardiac prominence.   Electronically Signed   By: Lowella Grip III M.D.   On: 11/29/2019 12:29  I personally reviewed the chest x-ray images and concur with the findings of a large left pleural effusion.  Impression: Christopher Orr is a 74 year old gentleman with multiple medical problems including tobacco abuse and COPD.  He had a robotic assisted left lower lobe wedge resection and node dissection on 11/09/2019.  His lesion turned out to be a stage Ia squamous cell carcinoma.  Postoperatively he had some confusion and possible withdrawal.  He feels better overall although he is still requiring oxygen.  He is not having any significant pain but has been taking his pain medication about every 6 hours.  I encouraged him a only use that if he is actually having pain at the time.  He is out of pain pills so went ahead and gave him a prescription for oxycodone 5 mg, 21 tablets to be used every 8 hours as needed for pain.  Again I asked him to only use that if he really needs it not necessarily used on a scheduled basis.  He did see Dr. Janese Banks of oncology and [place].  He will not need any adjuvant therapy.  He has a large pleural  effusion.  I recommended that we do a thoracentesis.  I discussed the indications, risk, benefits, and alternatives.  He understands the risk include bleeding, pneumothorax, infection, as well as possibility of other unforeseeable complications.  He accepts the risk and wishes to proceed.  Procedure note  Site marked.  Timeout performed.  Local anesthesia with 10 mL of 1% lidocaine.  Sterile technique.  Left thoracentesis performed, 2 L of amber fluid evacuated.  Tolerated well.   Plan: We will send for PA and lateral chest x-ray prior to going home Return in 2 weeks with repeat PA and lateral chest x-ray Prednisone taper Refill oxycodone 5 mg tablets 1 every 8 hours as needed for pain, 21 tablets, no refills.  Melrose Nakayama, MD [facility] [phone]

## 2019-12-01 DIAGNOSIS — I10 Essential (primary) hypertension: Secondary | ICD-10-CM | POA: Diagnosis not present

## 2019-12-01 DIAGNOSIS — M150 Primary generalized (osteo)arthritis: Secondary | ICD-10-CM | POA: Diagnosis not present

## 2019-12-01 DIAGNOSIS — I340 Nonrheumatic mitral (valve) insufficiency: Secondary | ICD-10-CM | POA: Diagnosis not present

## 2019-12-01 DIAGNOSIS — C3432 Malignant neoplasm of lower lobe, left bronchus or lung: Secondary | ICD-10-CM | POA: Diagnosis not present

## 2019-12-01 DIAGNOSIS — Z483 Aftercare following surgery for neoplasm: Secondary | ICD-10-CM | POA: Diagnosis not present

## 2019-12-01 DIAGNOSIS — E785 Hyperlipidemia, unspecified: Secondary | ICD-10-CM | POA: Diagnosis not present

## 2019-12-01 DIAGNOSIS — F0390 Unspecified dementia without behavioral disturbance: Secondary | ICD-10-CM | POA: Diagnosis not present

## 2019-12-01 DIAGNOSIS — J439 Emphysema, unspecified: Secondary | ICD-10-CM | POA: Diagnosis not present

## 2019-12-01 DIAGNOSIS — K219 Gastro-esophageal reflux disease without esophagitis: Secondary | ICD-10-CM | POA: Diagnosis not present

## 2019-12-02 DIAGNOSIS — E261 Secondary hyperaldosteronism: Secondary | ICD-10-CM | POA: Diagnosis not present

## 2019-12-02 DIAGNOSIS — J449 Chronic obstructive pulmonary disease, unspecified: Secondary | ICD-10-CM | POA: Diagnosis not present

## 2019-12-02 DIAGNOSIS — E785 Hyperlipidemia, unspecified: Secondary | ICD-10-CM | POA: Diagnosis not present

## 2019-12-02 DIAGNOSIS — C3432 Malignant neoplasm of lower lobe, left bronchus or lung: Secondary | ICD-10-CM | POA: Diagnosis not present

## 2019-12-02 DIAGNOSIS — I509 Heart failure, unspecified: Secondary | ICD-10-CM | POA: Diagnosis not present

## 2019-12-02 NOTE — Progress Notes (Signed)
I called and spoke with the patient and he was hesitant to schedule, saying that he was already seeing the oncologist. I scheduled him for the next available of 12/27/19.

## 2019-12-07 ENCOUNTER — Other Ambulatory Visit: Payer: Self-pay | Admitting: Urology

## 2019-12-07 DIAGNOSIS — J439 Emphysema, unspecified: Secondary | ICD-10-CM | POA: Diagnosis not present

## 2019-12-07 DIAGNOSIS — M150 Primary generalized (osteo)arthritis: Secondary | ICD-10-CM | POA: Diagnosis not present

## 2019-12-07 DIAGNOSIS — I10 Essential (primary) hypertension: Secondary | ICD-10-CM | POA: Diagnosis not present

## 2019-12-07 DIAGNOSIS — I340 Nonrheumatic mitral (valve) insufficiency: Secondary | ICD-10-CM | POA: Diagnosis not present

## 2019-12-07 DIAGNOSIS — F0390 Unspecified dementia without behavioral disturbance: Secondary | ICD-10-CM | POA: Diagnosis not present

## 2019-12-07 DIAGNOSIS — E785 Hyperlipidemia, unspecified: Secondary | ICD-10-CM | POA: Diagnosis not present

## 2019-12-07 DIAGNOSIS — Z483 Aftercare following surgery for neoplasm: Secondary | ICD-10-CM | POA: Diagnosis not present

## 2019-12-07 DIAGNOSIS — C3432 Malignant neoplasm of lower lobe, left bronchus or lung: Secondary | ICD-10-CM | POA: Diagnosis not present

## 2019-12-07 DIAGNOSIS — K219 Gastro-esophageal reflux disease without esophagitis: Secondary | ICD-10-CM | POA: Diagnosis not present

## 2019-12-12 ENCOUNTER — Other Ambulatory Visit: Payer: Self-pay | Admitting: Thoracic Surgery (Cardiothoracic Vascular Surgery)

## 2019-12-12 DIAGNOSIS — C3490 Malignant neoplasm of unspecified part of unspecified bronchus or lung: Secondary | ICD-10-CM

## 2019-12-12 NOTE — Progress Notes (Unsigned)
cx

## 2019-12-13 ENCOUNTER — Ambulatory Visit
Admission: RE | Admit: 2019-12-13 | Discharge: 2019-12-13 | Disposition: A | Discharge status: Home / Self Care | Payer: Medicare HMO | Source: Ambulatory Visit | Attending: Thoracic Surgery (Cardiothoracic Vascular Surgery) | Admitting: Thoracic Surgery (Cardiothoracic Vascular Surgery)

## 2019-12-13 ENCOUNTER — Other Ambulatory Visit: Payer: Self-pay

## 2019-12-13 ENCOUNTER — Other Ambulatory Visit: Payer: Self-pay | Admitting: Thoracic Surgery (Cardiothoracic Vascular Surgery)

## 2019-12-13 ENCOUNTER — Ambulatory Visit (INDEPENDENT_AMBULATORY_CARE_PROVIDER_SITE_OTHER): Payer: Self-pay | Admitting: Thoracic Surgery (Cardiothoracic Vascular Surgery)

## 2019-12-13 VITALS — BP 126/77 | HR 78 | Temp 97.8°F | Resp 20 | Ht 72.0 in | Wt 260.0 lb

## 2019-12-13 DIAGNOSIS — R079 Chest pain, unspecified: Secondary | ICD-10-CM

## 2019-12-13 DIAGNOSIS — C3432 Malignant neoplasm of lower lobe, left bronchus or lung: Secondary | ICD-10-CM

## 2019-12-13 DIAGNOSIS — J90 Pleural effusion, not elsewhere classified: Secondary | ICD-10-CM

## 2019-12-13 DIAGNOSIS — C3490 Malignant neoplasm of unspecified part of unspecified bronchus or lung: Secondary | ICD-10-CM

## 2019-12-13 DIAGNOSIS — J9811 Atelectasis: Secondary | ICD-10-CM | POA: Diagnosis not present

## 2019-12-13 MED ORDER — PREDNISONE 10 MG (21) PO TBPK: ORAL_TABLET | ORAL | 0 refills | Status: AC

## 2019-12-13 MED ORDER — OXYCODONE HCL 5 MG PO TABS: 5.0000 mg | ORAL_TABLET | Freq: Two times a day (BID) | ORAL | 0 refills | Status: DC | PRN

## 2019-12-13 MED ORDER — OXYCODONE HCL 5 MG PO TABS: 5.0000 mg | ORAL_TABLET | Freq: Three times a day (TID) | ORAL | 0 refills | Status: DC | PRN

## 2019-12-13 NOTE — Progress Notes (Signed)
Christopher Orr       [place] [address]             [phone]     HPI: [name] returns for a scheduled follow-up visit  Christopher Orr is a 74 year old man with a history of tobacco abuse, COPD, bipolar disorder, early dementia, ethanol abuse, hepatitis C, CAD, reflux, hypertension, hyperlipidemia, pancreatitis, PTSD, and depression.  He had a robotic wedge resection and node sampling for stage I a squamous cell carcinoma on 11/09/2019.  He had some confusion and possible withdrawal initially postoperatively but that resolved.  He went home on 1 to 2 L nasal cannula oxygen.  About a week after discharge she was treated presumptively for possible pneumonia.  He turned out to have a pleural effusion I suspect that was what was really causing his shortness of breath.  I did a thoracentesis at his last visit on 11/29/2019 and drained 2 L of fluid.  He had immediate improvement in his respiratory capacity after draining the fluid.  He is no longer using oxygen.  His exercise tolerance has improved dramatically.  He has some mild incisional pain.  He says his last prescription for oxycodone did not go through with the pharmacy.  Past Medical History:  Diagnosis Date  . Alcohol abuse    pt states it is marijuana  . Anemia   . Arthritis   . Bipolar disorder ([place])   . BPH (benign prostatic hypertrophy) with urinary obstruction   . CAD (coronary artery disease)    a. s/p stent to LAD in 2006 at [facility], EF 60% at that time.  . Cancer [facility])    prostate  . CHF (congestive heart failure) ([place])   . Chronic pancreatitis ([place])   . Cirrhosis ([place])   . COPD (chronic obstructive pulmonary disease) ([place])   . Dementia ([place])    early dementia  . Depression   . Diverticulosis   . Drug use   . Dyspnea   . ED (erectile dysfunction)   . Gastritis   . GERD (gastroesophageal reflux disease)   . Headache    occasional migraines  . Hepatitis C    treated  . History of lumbar fusion     . Hyperlipidemia   . Hypertension    patient denies having high blood pressure  . Mitral regurgitation   . Pancreatitis    x 2   . Pneumonia    07/06/18   . PTSD (post-traumatic stress disorder)   . Rib fracture    s/p fall 03/15/19   . Sinus disease   . Urge incontinence      Current Outpatient Medications  Medication Sig Dispense Refill  . albuterol (PROVENTIL) (2.5 MG/3ML) 0.083% nebulizer solution Take 3 mLs (2.5 mg total) by nebulization every 4 (four) hours as needed for wheezing or shortness of breath. 360 mL 3  . albuterol (VENTOLIN HFA) 108 (90 Base) MCG/ACT inhaler Inhale 1-2 puffs into the lungs every 6 (six) hours as needed for wheezing or shortness of breath.    . alprazolam (XANAX) 1 MG tablet Take 1 tablet (1 mg total) by mouth as directed. 1/2 in am 1 pill qhs    . ARIPiprazole (ABILIFY) 10 MG tablet Take 10 mg by mouth daily.    Marland Kitchen aspirin EC 81 MG tablet Take 81 mg by mouth daily.    . budesonide-formoterol (SYMBICORT) 160-4.5 MCG/ACT inhaler Inhale 2 puffs into the lungs 2 (two) times daily. Rinse  mouth out 1 Inhaler 1  . celecoxib (CELEBREX) 200 MG capsule Take 1 capsule (200 mg total) by mouth daily after breakfast. 90 capsule 0  . clopidogrel (PLAVIX) 75 MG tablet Take 1 tablet (75 mg total) by mouth daily. Reported on 01/15/2016 90 tablet 3  . donepezil (ARICEPT) 5 MG tablet Take 1 tablet (5 mg total) by mouth at bedtime. 90 tablet 3  . FLUoxetine (PROZAC) 20 MG capsule Take 60 mg by mouth daily.  1  . folic acid (FOLVITE) 1 MG tablet TAKE 1 TABLET BY MOUTH EVERY DAY (Patient taking differently: Take 1 mg by mouth daily. ) 90 tablet 0  . furosemide (LASIX) 40 MG tablet Take 1 tablet (40 mg total) by mouth daily. 5 tablet 0  . memantine (NAMENDA) 5 MG tablet Take 1 tablet (5 mg total) by mouth 2 (two) times daily. Taking qd 180 tablet 3  . metoprolol succinate (TOPROL-XL) 25 MG 24 hr tablet Take 1 tablet (25 mg total) by mouth daily. 90 tablet 3  . mirabegron ER  (MYRBETRIQ) 50 MG TB24 tablet Take 1 tablet (50 mg total) by mouth daily. 90 tablet 3  . Multiple Vitamins-Minerals (MULTIVITAMIN WITH MINERALS) tablet Take 1 tablet by mouth daily.    . mupirocin ointment (BACTROBAN) 2 % Apply 1 application topically 2 (two) times daily. Left back 60 g 0  . pantoprazole (PROTONIX) 40 MG tablet Take 1 tablet (40 mg total) by mouth daily. 30 min before breakfast 90 tablet 3  . potassium chloride 20 MEQ TBCR Take 20 mEq by mouth daily. 5 tablet 0  . rosuvastatin (CRESTOR) 10 MG tablet Take 1 tablet (10 mg total) by mouth at bedtime. (Patient taking differently: Take 10 mg by mouth daily. ) 90 tablet 3  . tamsulosin (FLOMAX) 0.4 MG CAPS capsule Take 1 capsule (0.4 mg total) by mouth daily after supper. (Patient taking differently: Take 0.4 mg by mouth daily. ) 90 capsule 3  . traZODone (DESYREL) 150 MG tablet Take 1 tablet (150 mg total) by mouth at bedtime. 90 tablet 3  . oxyCODONE (OXY IR/ROXICODONE) 5 MG immediate release tablet Take 1 tablet (5 mg total) by mouth every 8 (eight) hours as needed for severe pain. 21 tablet 0  . predniSONE (STERAPRED UNI-PAK 21 TAB) 10 MG (21) TBPK tablet Take 5 tablets (50 mg total) by mouth daily for 1 day, THEN 4 tablets (40 mg total) daily for 1 day, THEN 3 tablets (30 mg total) daily for 1 day, THEN 2 tablets (20 mg total) daily for 1 day, THEN 1 tablet (10 mg total) daily for 1 day. 15 tablet 0   No current facility-administered medications for this visit.    Physical Exam BP 126/77   Pulse 78   Temp 97.8 F (36.6 C) (Skin)   Resp 20   Ht 6' (1.829 m)   Wt 260 lb (117.9 kg)   SpO2 95% Comment: RA  BMI 35.40 kg/m  74 year old man in no acute distress Alert and oriented x3 with no focal deficits Lungs diminished at left base but otherwise clear Incisions well-healed Cardiac regular rate and rhythm  Diagnostic Tests: CHEST - 2 VIEW  COMPARISON:  11/29/2019  FINDINGS: Heart size remains normal. Mediastinal  shadows are normal. Persistent atelectasis at the left base with a small left effusion. No pneumothorax. Aeration is slightly improved since the previous study.  IMPRESSION: Improving aeration at the left base, but with some persistent atelectasis and small amount of pleural fluid.  Electronically Signed   By: Nelson Chimes M.D.   On: 12/13/2019 11:37 I personally reviewed the chest x-ray images and concur with the findings noted above  Impression: Christopher Orr is a 74 year old man with a complex medical history.  He had a robotic wedge resection and node sampling for a stage Ia squamous cell carcinoma on 11/09/2019.  Initial postoperative course was complicated by some delirium and confusion.  Resolved.  Pleural effusion-he had a significant pleural effusion on his chest x-ray 2 weeks ago.  I did a thoracentesis and drained 2 L of fluid.  This was inflammatory in nature.  He was also treated with a prednisone taper.  His chest x-ray today shows some residual fluid at the base but actually is improved from his post drainage film 2 weeks ago.  I think he may just have a little scarring at the base there.  I am getting give him another prednisone taper just to see if that will help any.  I will plan to check another x-ray in about 2 months.  Stage Ia squamous cell carcinoma-he saw Dr. Janese Banks in [place].  No adjuvant therapy recommended.  Postoperative pain-has not been taking narcotics due to a problem with this prescription.  I put in a new prescription for 20 oxycodone tablets to be used up to 3 times a day as needed.  I recommended that he avoid using those as much as possible.  The prescription went through without having to use [place] so I am not sure if there is a Hotel manager issue with his pharmacy on [place].  He will let us know if that does not go through.  Plan: Return in 2 months with PA and lateral chest x-ray  Melrose Nakayama, MD [facility] [phone]

## 2019-12-13 NOTE — Progress Notes (Signed)
Notified by [facility] pharmacy that they had not received prescription for oxycodone.  I again placed an order for it.  I turns out that it was being sent to parental instead of being transmitted electronically.  That would explain the issues previously.  I corrected that and the order was transmitted electronically.  Revonda Standard Roxan Hockey, MD [facility] [phone]

## 2019-12-19 NOTE — Progress Notes (Signed)
12/20/19  CC:  Chief Complaint  Patient presents with  . Cysto    HPI: Christopher Orr is a 74 y.o. M with a hx of prostate cancer and multiple GU issues returns today for a cysto.  History of prostate cancer He was dx with low risk Gleason 3+3 prostate cancer s/p brachytherapy seed implant 04/22/16 with I-125 seeds.   He was dx after rise in his PSA up to 3.8 on 12/20/15 up from 3.2 on 09/24/15, and 2.2 the previous year s/p prostate biopsy on 01/15/16 demonstrating prostate cancer, T1c, Gleason 3+3 involving 7/12 cores including all cores on left and single core at right apex up to 70% tissue. TRUS volume 42 cc.  Most recent PSA0.2 on 11/04/2019.   BPH with LUTS/ urinary frequency/ nocturia He does also have baseline lower urinary tract symptoms including urinary urgency and urge incontinence. He is currently on Mybetriq 50 mg and flomax 0.4 mg.   Ton previous visit, he complained of worsening night time voiding symptoms including incontinence.  He recently started drinking more whiskey and less beer but continues to drink in excess. He has no daytime symptoms. He has not withdrawn from EtOH in past.   No gross hematuria or dysuria.No UTIs.PVR 4 mL.   ED Baseline severe ED, unable to achieve or maintain an erection.Notdiscussed today.  Renal cyst Contacted by PCP, recently underwent repeat imaging in the form of MRI of the abdomen for further evaluation/follow-up of a renal cyst seen on previous imaging.  MRI revealed a 15 mm complex cystic lesion with hemorrhagic mural nodule in the anterior right lower pole of the kidney.  This previously measured 2.3 cm.  He has additional Bosniak 1 and 2 cyst as well.  No flank pain.  Microscopic hematuria UA from 11/08/19 revealed 3-10 RBC.   Please see previous note for details.   Today's Vitals   12/20/19 1015  BP: (!) 158/92  Weight: 260 lb (117.9 kg)  Height: 6' (1.829 m)   Body mass index is 35.26 kg/m. NED.  A&Ox3.   No respiratory distress   Abd soft, NT, ND Normal phallus with bilateral descended testicles  Cystoscopy Procedure Note  Patient identification was confirmed, informed consent was obtained, and patient was prepped using Betadine solution.  Lidocaine jelly was administered per urethral meatus.     Pre-Procedure: - Inspection reveals a normal caliber ureteral meatus.  Procedure: The flexible cystoscope was introduced without difficulty - No urethral strictures/lesions are present. - Enlarged prostate w/ bilobar coaptation - Normal bladder neck - Bilateral ureteral orifices identified - Bladder mucosa  reveals no ulcers, tumors, or lesions - No bladder stones - Mild trabeculation  Retroflexion unremarkable.   Post-Procedure: - Patient tolerated the procedure well  Assessment/ Plan:  1. Microscopic hematuria Given multiple negative imaging studies in the recent past, will hold off on further imaging at this time Cystoscopy today is unremarkable F/u in 1 year for PSA/UA  2. Prostate cancer  Most recent PSA0.2 on 11/04/2019, well controlled.  Return for PSA in 1 year  3. Renal cyst MRI was personally reviewed today, agree with radiologic interpretation. Bosniak 49F renal cyst, will continue to follow Given decrease in size, this is relatively reassuring We will plan for MRI in 10/2020 He is agreeable this plan  I, Lucas Mallow, am acting as a scribe for Dr. Hollice Espy,  I have reviewed the above documentation for accuracy and completeness, and I agree with the above.   Hollice Espy, MD

## 2019-12-20 ENCOUNTER — Ambulatory Visit: Payer: Medicare HMO | Admitting: Urology

## 2019-12-20 ENCOUNTER — Encounter: Payer: Self-pay | Admitting: Urology

## 2019-12-20 ENCOUNTER — Other Ambulatory Visit: Payer: Self-pay

## 2019-12-20 VITALS — BP 158/92 | Ht 72.0 in | Wt 260.0 lb

## 2019-12-20 DIAGNOSIS — N281 Cyst of kidney, acquired: Secondary | ICD-10-CM

## 2019-12-20 DIAGNOSIS — C61 Malignant neoplasm of prostate: Secondary | ICD-10-CM

## 2019-12-20 DIAGNOSIS — R3129 Other microscopic hematuria: Secondary | ICD-10-CM

## 2019-12-21 DIAGNOSIS — F411 Generalized anxiety disorder: Secondary | ICD-10-CM | POA: Diagnosis not present

## 2019-12-21 LAB — URINALYSIS, COMPLETE
Bilirubin, UA: NEGATIVE
Glucose, UA: NEGATIVE
Leukocytes,UA: NEGATIVE
Nitrite, UA: NEGATIVE
Specific Gravity, UA: 1.025 (ref 1.005–1.030)
Urobilinogen, Ur: 0.2 mg/dL (ref 0.2–1.0)
pH, UA: 5.5 (ref 5.0–7.5)

## 2019-12-21 LAB — MICROSCOPIC EXAMINATION: Bacteria, UA: NONE SEEN

## 2019-12-27 ENCOUNTER — Encounter: Payer: Self-pay | Admitting: Pulmonary Disease

## 2019-12-27 ENCOUNTER — Ambulatory Visit: Payer: Medicare HMO | Admitting: Pulmonary Disease

## 2019-12-27 ENCOUNTER — Other Ambulatory Visit: Payer: Self-pay

## 2019-12-27 VITALS — BP 100/70 | HR 76 | Temp 97.3°F | Ht 72.0 in | Wt 261.0 lb

## 2019-12-27 DIAGNOSIS — J449 Chronic obstructive pulmonary disease, unspecified: Secondary | ICD-10-CM | POA: Diagnosis not present

## 2019-12-27 DIAGNOSIS — R0609 Other forms of dyspnea: Secondary | ICD-10-CM

## 2019-12-27 DIAGNOSIS — R0600 Dyspnea, unspecified: Secondary | ICD-10-CM

## 2019-12-27 DIAGNOSIS — C3492 Malignant neoplasm of unspecified part of left bronchus or lung: Secondary | ICD-10-CM

## 2019-12-27 DIAGNOSIS — Z87891 Personal history of nicotine dependence: Secondary | ICD-10-CM | POA: Diagnosis not present

## 2019-12-27 NOTE — Patient Instructions (Signed)
Your lungs sounded clear today.  Your oxygen level does very well when you are walking.  Continue increasing her activities as tolerated.  We will see you in follow-up in 2 months time.   No inhaler changes were made at this time.

## 2019-12-27 NOTE — Progress Notes (Signed)
Subjective:    Patient ID: Christopher Orr, male    DOB: Jul 16, 1946, 74 y.o.   MRN: [id number]  HPI Is a 74 year old recent former smoker (February 2021) who presents recent attic wedge resection and node sampling for stage I squamous cell carcinoma on 11/09/2019 by Dr. Roxan Hockey in [place].  The patient had previously been seen in this clinic number of 2019 a preop evaluation prior to hernia surgery.  We are asked to reevaluate him because of shortness of breath post his recent wedge resection.  After his wedge resection the patient some issues with shortness of breath and required oxygen transiently.  About a week after discharge he was treated for possible pneumonia but then was found out to have a pleural effusion and this was drained by Dr. Roxan Hockey on [address] yielding 2 L of fluid.  Since then he has had marked improvement in his respiratory status.  He remains short of breath on exertion however does note that he is improving day today.  He is using Symbicort and as needed albuterol.  As noted he has discontinued his smoking habit.  He has not had any chest pain except for some incisional discomfort but this is also markedly improved.  No fevers, chills or sweats.  No cough or sputum production.  No hemoptysis.  He does not endorse any other symptomatology.   Review of Systems A 10 point review of systems was performed and it is as noted above otherwise negative.  No Known Allergies  Current Meds  Medication Sig  . albuterol (PROVENTIL) (2.5 MG/3ML) 0.083% nebulizer solution Take 3 mLs (2.5 mg total) by nebulization every 4 (four) hours as needed for wheezing or shortness of breath.  Marland Kitchen albuterol (VENTOLIN HFA) 108 (90 Base) MCG/ACT inhaler Inhale 1-2 puffs into the lungs every 6 (six) hours as needed for wheezing or shortness of breath.  . alprazolam (XANAX) 1 MG tablet Take 1 tablet (1 mg total) by mouth as directed. 1/2 in am 1 pill qhs  . ARIPiprazole (ABILIFY) 10 MG tablet Take 10  mg by mouth daily.  Marland Kitchen aspirin EC 81 MG tablet Take 81 mg by mouth daily.  . budesonide-formoterol (SYMBICORT) 160-4.5 MCG/ACT inhaler Inhale 2 puffs into the lungs 2 (two) times daily. Rinse mouth out  . celecoxib (CELEBREX) 200 MG capsule Take 1 capsule (200 mg total) by mouth daily after breakfast.  . clopidogrel (PLAVIX) 75 MG tablet Take 1 tablet (75 mg total) by mouth daily. Reported on 01/15/2016  . donepezil (ARICEPT) 5 MG tablet Take 1 tablet (5 mg total) by mouth at bedtime.  Marland Kitchen FLUoxetine (PROZAC) 20 MG capsule Take 60 mg by mouth daily.  . folic acid (FOLVITE) 1 MG tablet TAKE 1 TABLET BY MOUTH EVERY DAY (Patient taking differently: Take 1 mg by mouth daily. )  . furosemide (LASIX) 40 MG tablet Take 1 tablet (40 mg total) by mouth daily.  . memantine (NAMENDA) 5 MG tablet Take 1 tablet (5 mg total) by mouth 2 (two) times daily. Taking qd  . metoprolol succinate (TOPROL-XL) 25 MG 24 hr tablet Take 1 tablet (25 mg total) by mouth daily.  . mirabegron ER (MYRBETRIQ) 50 MG TB24 tablet Take 1 tablet (50 mg total) by mouth daily.  . Multiple Vitamins-Minerals (MULTIVITAMIN WITH MINERALS) tablet Take 1 tablet by mouth daily.  . mupirocin ointment (BACTROBAN) 2 % Apply 1 application topically 2 (two) times daily. Left back  . pantoprazole (PROTONIX) 40 MG tablet Take 1 tablet (40  mg total) by mouth daily. 30 min before breakfast  . potassium chloride 20 MEQ TBCR Take 20 mEq by mouth daily.  . rosuvastatin (CRESTOR) 10 MG tablet Take 1 tablet (10 mg total) by mouth at bedtime. (Patient taking differently: Take 10 mg by mouth daily. )  . tamsulosin (FLOMAX) 0.4 MG CAPS capsule Take 1 capsule (0.4 mg total) by mouth daily after supper. (Patient taking differently: Take 0.4 mg by mouth daily. )  . traZODone (DESYREL) 150 MG tablet Take 1 tablet (150 mg total) by mouth at bedtime.   Immunization History  Administered Date(s) Administered  . Fluad Quad(high Dose 65+) 05/04/2019  . Influenza, High  Dose Seasonal PF 04/15/2019  . Influenza,inj,Quad PF,6+ Mos 05/04/2018  . Pneumococcal Conjugate-13 04/15/2019        Objective:   Physical Exam BP 100/70 (BP Location: Left Arm, Patient Position: Sitting, Cuff Size: Large)   Pulse 76   Temp (!) 97.3 F (36.3 C) (Temporal)   Ht 6' (1.829 m)   Wt 261 lb (118.4 kg)   SpO2 95%   BMI 35.40 kg/m   GENERAL: Obese gentleman, no acute respiratory distress.  Fully ambulatory. HEAD: Normocephalic, atraumatic.  EYES: Pupils equal, round, reactive to light.  No scleral icterus.  MOUTH: Nose/mouth/throat not examined due to masking requirements for COVID 19. NECK: Supple.  Thick neck.  No thyromegaly. Trachea midline. No JVD.  No adenopathy. PULMONARY: Good air entry bilaterally.  Lungs clear to auscultation bilaterally. CARDIOVASCULAR: S1 and S2. Regular rate and rhythm.  No rubs murmurs or gallops heard. GASTROINTESTINAL: Obese, benign. MUSCULOSKELETAL: No joint deformity, no clubbing, no edema.  NEUROLOGIC: No overt focal deficits. Speech is fluent.  No gait disturbance noticed on ambulation. SKIN: Intact,warm,dry.  Onychomycosis, toes. PSYCH: Normal mood and affect.   Ambulatory oximetry was performed: No desaturations with exercise, oxygen saturations remained at 95 to 96%.  Operative PFTs performed 08 September 2019: FEV1 2.43 L or 71% predicted.  FVC 3.88 L or 83% predicted.  FEV1/FVC 63%.  No bronchodilator response.  Lung volumes normal.  Diffusion capacity 53%.  Insistent with moderate obstructive defect, diffusion capacity decreased suggestive of emphysema.    Assessment & Plan:     ICD-10-CM   1. Stage 2 moderate COPD by GOLD classification (HCC)  J44.9    Continue Symbicort and as needed albuterol Follow-up 2 months  2. Squamous carcinoma of lung, left (HCC)  C34.92    Status post robotic wedge resection, stage I No adjuvant chemotherapy needed  3. Dyspnea on exertion  R06.00    Suspect due to deconditioning post  surgery Continue activities as tolerated  4. Former smoker  Z87.891    Commended on discontinuation of smoking and continued abstinence from cigarettes    Discussion:  Appears that the patient's issues postoperatively with dyspnea were related to pleural effusion which occurred after surgery.  This is not an unusual finding after surgery due to inflammatory changes.  He underwent successful thoracentesis in this has now resolved.  Patient is likely experiencing some mild deconditioning after his.  Of relative decreased activity, he has been instructed to continue increasing his activities as tolerated.  Currently no need to add to his respiratory medication regimen.  Continue Symbicort and as needed albuterol.  We will see him in follow-up in 2 months time patient to call sooner should any new difficulties arise.   Renold Don, MD [facility]   *This note was dictated using voice recognition software/Dragon.  Despite best  efforts to proofread, errors can occur which can change the meaning.  Any change was purely unintentional.

## 2019-12-28 ENCOUNTER — Ambulatory Visit (INDEPENDENT_AMBULATORY_CARE_PROVIDER_SITE_OTHER): Payer: Medicare HMO | Admitting: Internal Medicine

## 2019-12-28 ENCOUNTER — Other Ambulatory Visit: Payer: Self-pay

## 2019-12-28 ENCOUNTER — Encounter: Payer: Self-pay | Admitting: Internal Medicine

## 2019-12-28 VITALS — BP 122/82 | HR 74 | Temp 97.0°F | Ht 72.0 in | Wt 260.0 lb

## 2019-12-28 DIAGNOSIS — C3492 Malignant neoplasm of unspecified part of left bronchus or lung: Secondary | ICD-10-CM

## 2019-12-28 DIAGNOSIS — C3490 Malignant neoplasm of unspecified part of unspecified bronchus or lung: Secondary | ICD-10-CM

## 2019-12-28 DIAGNOSIS — H6123 Impacted cerumen, bilateral: Secondary | ICD-10-CM | POA: Diagnosis not present

## 2019-12-28 NOTE — Patient Instructions (Addendum)
Repeat chest CT ask Dr. Janese Banks when she will order this I think late summer/early fall  cxr (chest Xray 02/12/20) Dr. Roxan Hockey   Call [facility] and see if they can tell you whos in network for psychiatry   Lists of psychiatrists  [facility] (individual therapy) [facility]    Debrox ear wax drops 1x per month 5-10 drops let sit x 5-10 minutes   Earwax Buildup, Adult The ears produce a substance called earwax that helps keep bacteria out of the ear and protects the skin in the ear canal. Occasionally, earwax can build up in the ear and cause discomfort or hearing loss. What increases the risk? This condition is more likely to develop in people who:  Are male.  Are elderly.  Naturally produce more earwax.  Clean their ears often with cotton swabs.  Use earplugs often.  Use in-ear headphones often.  Wear hearing aids.  Have narrow ear canals.  Have earwax that is overly thick or sticky.  Have eczema.  Are dehydrated.  Have excess hair in the ear canal. What are the signs or symptoms? Symptoms of this condition include:  Reduced or muffled hearing.  A feeling of fullness in the ear or feeling that the ear is plugged.  Fluid coming from the ear.  Ear pain.  Ear itch.  Ringing in the ear.  Coughing.  An obvious piece of earwax that can be seen inside the ear canal. How is this diagnosed? This condition may be diagnosed based on:  Your symptoms.  Your medical history.  An ear exam. During the exam, your health care provider will look into your ear with an instrument called an otoscope. You may have tests, including a hearing test. How is this treated? This condition may be treated by:  Using ear drops to soften the earwax.  Having the earwax removed by a health care provider. The health care provider may: ? Flush the ear with water. ? Use an instrument that has a loop on the end (curette). ? Use a suction  device.  Surgery to remove the wax buildup. This may be done in severe cases. Follow these instructions at home:   Take over-the-counter and prescription medicines only as told by your health care provider.  Do not put any objects, including cotton swabs, into your ear. You can clean the opening of your ear canal with a washcloth or facial tissue.  Follow instructions from your health care provider about cleaning your ears. Do not over-clean your ears.  Drink enough fluid to keep your urine clear or pale yellow. This will help to thin the earwax.  Keep all follow-up visits as told by your health care provider. If earwax builds up in your ears often or if you use hearing aids, consider seeing your health care provider for routine, preventive ear cleanings. Ask your health care provider how often you should schedule your cleanings.  If you have hearing aids, clean them according to instructions from the manufacturer and your health care provider. Contact a health care provider if:  You have ear pain.  You develop a fever.  You have blood, pus, or other fluid coming from your ear.  You have hearing loss.  You have ringing in your ears that does not go away.  Your symptoms do not improve with treatment.  You feel like the room is spinning (vertigo). Summary  Earwax can build up in the ear and cause discomfort or hearing loss.  The most common symptoms of this condition include reduced or muffled hearing and a feeling of fullness in the ear or feeling that the ear is plugged.  This condition may be diagnosed based on your symptoms, your medical history, and an ear exam.  This condition may be treated by using ear drops to soften the earwax or by having the earwax removed by a health care provider.  Do not put any objects, including cotton swabs, into your ear. You can clean the opening of your ear canal with a washcloth or facial tissue. This information is not intended to  replace advice given to you by your health care provider. Make sure you discuss any questions you have with your health care provider. Document Revised: 06/26/2017 Document Reviewed: 09/24/2016 Elsevier Patient Education  2020 [employer].

## 2019-12-28 NOTE — Progress Notes (Signed)
Chief Complaint  Patient presents with  . Follow-up   F/u  1. Lung cancer SCC left lung s/p resection doing well but recently had to have thoracentesis with 2L drained CTS and will have repeat CXR 02/14/20 appt CTS and CT scan repeat in 6 months per Dr. Janese Banks and Dr. Janese Banks f/u 01/2020  He is feeling better not on O2 and no sob and chest tubes out  2. B/l ear was impaction w/o sx's agreeable to lavage today    Review of Systems  Constitutional: Negative for weight loss.  HENT: Negative for hearing loss.   Eyes: Negative for blurred vision.  Respiratory: Negative for shortness of breath.   Cardiovascular: Negative for chest pain.  Gastrointestinal: Negative for abdominal pain.  Musculoskeletal: Negative for falls.  Skin: Negative for rash.  Neurological: Negative for headaches.  Psychiatric/Behavioral: Negative for depression.   Past Medical History:  Diagnosis Date  . Alcohol abuse    pt states it is marijuana  . Anemia   . Arthritis   . Bipolar disorder ([place])   . BPH (benign prostatic hypertrophy) with urinary obstruction   . CAD (coronary artery disease)    a. s/p stent to LAD in 2006 at [facility], EF 60% at that time.  . Cancer [facility])    prostate  . CHF (congestive heart failure) ([place])   . Chronic pancreatitis ([place])   . Cirrhosis ([place])   . COPD (chronic obstructive pulmonary disease) ([place])   . Dementia ([place])    early dementia  . Depression   . Diverticulosis   . Drug use   . Dyspnea   . ED (erectile dysfunction)   . Gastritis   . GERD (gastroesophageal reflux disease)   . Headache    occasional migraines  . Hepatitis C    treated  . History of lumbar fusion   . Hyperlipidemia   . Hypertension    patient denies having high blood pressure  . Mitral regurgitation   . Pancreatitis    x 2   . Pneumonia    07/06/18   . PTSD (post-traumatic stress disorder)   . Rib fracture    s/p fall 03/15/19   . Sinus disease   . Urge incontinence    Past Surgical History:   Procedure Laterality Date  . CARDIAC CATHETERIZATION  2006  . cateract  2013  . COLONOSCOPY WITH PROPOFOL N/A 10/04/2019   Procedure: COLONOSCOPY WITH PROPOFOL;  Surgeon: Lucilla Lame, MD;  Location: [facility] ENDOSCOPY;  Service: Endoscopy;  Laterality: N/A;  . CORONARY ANGIOPLASTY  2006  . CORONARY STENT PLACEMENT  2006   x 1  . ESOPHAGOGASTRODUODENOSCOPY N/A 12/22/2014   Procedure: ESOPHAGOGASTRODUODENOSCOPY (EGD);  Surgeon: [name], MD;  Location: [facility] ENDOSCOPY;  Service: Endoscopy;  Laterality: N/A;  . ESOPHAGOGASTRODUODENOSCOPY (EGD) WITH PROPOFOL N/A 10/04/2019   Procedure: ESOPHAGOGASTRODUODENOSCOPY (EGD) WITH PROPOFOL;  Surgeon: Lucilla Lame, MD;  Location: [facility] ENDOSCOPY;  Service: Endoscopy;  Laterality: N/A;  . HERNIA REPAIR  2015   left groin  . INTERCOSTAL NERVE BLOCK Left 11/09/2019   Procedure: Intercostal Nerve Block;  Surgeon: Melrose Nakayama, MD;  Location: [place];  Service: Thoracic;  Laterality: Left;  . LUMBAR FUSION    . RADIOACTIVE SEED IMPLANT N/A 04/22/2016   Procedure: RADIOACTIVE SEED IMPLANT/BRACHYTHERAPY IMPLANT;  Surgeon: Hollice Espy, MD;  Location: [facility] ORS;  Service: Urology;  Laterality: N/A;  . ROBOT ASSISTED INGUINAL HERNIA REPAIR Bilateral 07/06/2018   Procedure: ROBOT ASSISTED INGUINAL HERNIA REPAIR;  Surgeon: Caroleen Hamman  F, MD;  Location: [facility] ORS;  Service: General;  Laterality: Bilateral;  . TONSILLECTOMY    . UMBILICAL HERNIA REPAIR N/A 07/06/2018   Procedure: LAPAROSCOPIC ROBOT ASSISTED UMBILICAL HERNIA;  Surgeon: Jules Husbands, MD;  Location: [facility] ORS;  Service: General;  Laterality: N/A;  . XI ROBOTIC ASSISTED THORACOSCOPY- SEGMENTECTOMY Left 11/09/2019   Procedure: XI ROBOTIC ASSISTED THORACOSCOPY-WEDGE RESECTION LEFT LOWER LOBE;  Surgeon: Melrose Nakayama, MD;  [facility]: [facility];  Service: Thoracic;  Laterality: Left;   Family History  Problem Relation Age of Onset  . Heart disease Father        Unclear details. Father passed  in late 37's 2/2 cancer.  . Colon cancer Father   . Alcohol abuse Sister   . Drug abuse Sister   . Anxiety disorder Sister   . Depression Sister   . Kidney disease Neg Hx   . Prostate cancer Neg Hx    Social History   Socioeconomic History  . Marital status: Divorced    Spouse name: Not on file  . Number of children: 4  . Years of education: Not on file  . Highest education level: GED or equivalent  Occupational History  . Occupation: Aeronautical engineer    Comment: retired  Tobacco Use  . Smoking status: Former Smoker    Types: Cigarettes    Start date: 07/28/1962    Quit date: 09/01/2019    Years since quitting: 0.3  . Smokeless tobacco: Never Used  Substance and Sexual Activity  . Alcohol use: Yes    Alcohol/week: 0.0 standard drinks    Comment: states he drinks a pint a day (11/07/19)   . Drug use: Yes    Frequency: 7.0 times per week    Types: Marijuana    Comment: Endorsed heroin 06/2014, UDS also + benzos, opiates, THC, neg for cocaine at that time.  Marland Kitchen Sexual activity: Not Currently  Other Topics Concern  . Not on file  Social History Narrative   Lives at home    Has kids and grandkids   Smoker x 55 years 1 ppd as of 07/2019 he did quit x 2 years    Drinks 1 pt liq qd as of 07/2019    Social Determinants of Health   Financial Resource Strain: Low Risk   . Difficulty of Paying Living Expenses: Not very hard  Food Insecurity: Unknown  . Worried About Charity fundraiser in the Last Year: Never true  . Ran Out of Food in the Last Year: Not on file  Transportation Needs:   . Lack of Transportation (Medical):   Marland Kitchen Lack of Transportation (Non-Medical):   Physical Activity:   . Days of Exercise per Week:   . Minutes of Exercise per Session:   Stress:   . Feeling of Stress :   Social Connections:   . Frequency of Communication with Friends and Family:   . Frequency of Social Gatherings with Friends and Family:   . Attends Religious Services:   . Active Member of  Clubs or Organizations:   . Attends Archivist Meetings:   Marland Kitchen Marital Status:   Intimate Partner Violence:   . Fear of Current or Ex-Partner:   . Emotionally Abused:   Marland Kitchen Physically Abused:   . Sexually Abused:    Current Meds  Medication Sig  . albuterol (PROVENTIL) (2.5 MG/3ML) 0.083% nebulizer solution Take 3 mLs (2.5 mg total) by nebulization every 4 (four) hours as needed for wheezing or  shortness of breath.  Marland Kitchen albuterol (VENTOLIN HFA) 108 (90 Base) MCG/ACT inhaler Inhale 1-2 puffs into the lungs every 6 (six) hours as needed for wheezing or shortness of breath.  . alprazolam (XANAX) 1 MG tablet Take 1 tablet (1 mg total) by mouth as directed. 1/2 in am 1 pill qhs  . ARIPiprazole (ABILIFY) 10 MG tablet Take 10 mg by mouth daily.  Marland Kitchen aspirin EC 81 MG tablet Take 81 mg by mouth daily.  . budesonide-formoterol (SYMBICORT) 160-4.5 MCG/ACT inhaler Inhale 2 puffs into the lungs 2 (two) times daily. Rinse mouth out  . celecoxib (CELEBREX) 200 MG capsule Take 1 capsule (200 mg total) by mouth daily after breakfast.  . clopidogrel (PLAVIX) 75 MG tablet Take 1 tablet (75 mg total) by mouth daily. Reported on 01/15/2016  . donepezil (ARICEPT) 5 MG tablet Take 1 tablet (5 mg total) by mouth at bedtime.  Marland Kitchen FLUoxetine (PROZAC) 20 MG capsule Take 60 mg by mouth daily.  . folic acid (FOLVITE) 1 MG tablet TAKE 1 TABLET BY MOUTH EVERY DAY (Patient taking differently: Take 1 mg by mouth daily. )  . furosemide (LASIX) 40 MG tablet Take 1 tablet (40 mg total) by mouth daily.  . memantine (NAMENDA) 5 MG tablet Take 1 tablet (5 mg total) by mouth 2 (two) times daily. Taking qd  . metoprolol succinate (TOPROL-XL) 25 MG 24 hr tablet Take 1 tablet (25 mg total) by mouth daily.  . mirabegron ER (MYRBETRIQ) 50 MG TB24 tablet Take 1 tablet (50 mg total) by mouth daily.  . Multiple Vitamins-Minerals (MULTIVITAMIN WITH MINERALS) tablet Take 1 tablet by mouth daily.  . pantoprazole (PROTONIX) 40 MG tablet Take  1 tablet (40 mg total) by mouth daily. 30 min before breakfast  . potassium chloride 20 MEQ TBCR Take 20 mEq by mouth daily.  . rosuvastatin (CRESTOR) 10 MG tablet Take 1 tablet (10 mg total) by mouth at bedtime. (Patient taking differently: Take 10 mg by mouth daily. )  . tamsulosin (FLOMAX) 0.4 MG CAPS capsule Take 1 capsule (0.4 mg total) by mouth daily after supper. (Patient taking differently: Take 0.4 mg by mouth daily. )  . traZODone (DESYREL) 150 MG tablet Take 1 tablet (150 mg total) by mouth at bedtime.   No Known Allergies Recent Results (from the past 2160 hour(s))  SARS CORONAVIRUS 2 (TAT 6-24 HRS) Nasopharyngeal Nasopharyngeal Swab     Status: None   Collection Time: 09/30/19 11:55 AM   Specimen: Nasopharyngeal Swab  Result Value Ref Range   SARS Coronavirus 2 NEGATIVE NEGATIVE    Comment: (NOTE) SARS-CoV-2 target nucleic acids are NOT DETECTED. The SARS-CoV-2 RNA is generally detectable in upper and lower respiratory specimens during the acute phase of infection. Negative results do not preclude SARS-CoV-2 infection, do not rule out co-infections with other pathogens, and should not be used as the sole basis for treatment or other patient management decisions. Negative results must be combined with clinical observations, patient history, and epidemiological information. The expected result is Negative. Fact Sheet for Patients: SugarRoll.be Fact Sheet for Healthcare Providers: https://www.woods-mathews.com/ This test is not yet approved or cleared by the [place] FDA and  has been authorized for detection and/or diagnosis of SARS-CoV-2 by FDA under an Emergency Use Authorization (EUA). This EUA will remain  in effect (meaning this test can be used) for the duration of the COVID-19 declaration under Section 56 4(b)(1) of the Act, 21 U.S.C. section 360bbb-3(b)(1), unless the authorization is terminated or revoked  sooner. Performed at [facility], [place] [address], [place], [place] [address]   Surgical pathology     Status: None   Collection Time: 10/04/19 10:05 AM  Result Value Ref Range   SURGICAL PATHOLOGY      SURGICAL PATHOLOGY CASE: [id number] PATIENT: [name] Surgical Pathology Report     Specimen Submitted: A. Colon polyp, transverse; cbx B. Colon polyp, sigmoid; cbx  Clinical History: Screening for esophageal varices I85.01 Screening Z12.11. Irregular z-line, diverticulosis, colon polyps.      DIAGNOSIS: A.  COLON POLYP, TRANSVERSE; COLD BIOPSY: - TUBULAR ADENOMA. - NEGATIVE FOR HIGH-GRADE DYSPLASIA AND MALIGNANCY.  B.  COLON POLYP, SIGMOID; COLD BIOPSY: - HYPERPLASTIC POLYP. - NEGATIVE FOR DYSPLASIA AND MALIGNANCY.  GROSS DESCRIPTION: A. Labeled: Transverse colon polyp, C BX Received: Formalin Tissue fragment(s): 1 Size: 0.4 cm Description: Tan soft tissue fragment Entirely submitted in 1 cassette.  B. Labeled: Sigmoid colon polyp, C BX Received: Formalin Tissue fragment(s): 1 Size: 0.3 cm Description: Tan soft tissue fragment Entirely submitted in 1 cassette.    Final Diagnosis performed by Quay Burow, MD.   Electronically signed  10/05/2019 11:49:17AM The electronic signature indicates that the named Attending Pathologist has evaluated the specimen Technical component performed at [facility], [address], [place], [place] [address] Lab: [phone] Dir: Rush Farmer, MD, MMM  Professional component performed at [facility], [facility], [place], [place], [place] [address] Lab: [phone] Dir: Dellia Nims. Rubinas, MD   SARS CORONAVIRUS 2 (TAT 6-24 HRS) Nasopharyngeal Nasopharyngeal Swab     Status: None   Collection Time: 10/13/19 10:08 AM   Specimen: Nasopharyngeal Swab  Result Value Ref Range   SARS Coronavirus 2 NEGATIVE NEGATIVE    Comment: (NOTE) SARS-CoV-2 target nucleic acids are NOT DETECTED. The  SARS-CoV-2 RNA is generally detectable in upper and lower respiratory specimens during the acute phase of infection. Negative results do not preclude SARS-CoV-2 infection, do not rule out co-infections with other pathogens, and should not be used as the sole basis for treatment or other patient management decisions. Negative results must be combined with clinical observations, patient history, and epidemiological information. The expected result is Negative. Fact Sheet for Patients: SugarRoll.be Fact Sheet for Healthcare Providers: https://www.woods-mathews.com/ This test is not yet approved or cleared by the [place] FDA and  has been authorized for detection and/or diagnosis of SARS-CoV-2 by FDA under an Emergency Use Authorization (EUA). This EUA will remain  in effect (meaning this test can be used) for the duration of the COVID-19 declaration under Section 56 4(b)(1) of the Act, 21 U.S.C. section 360bbb-3(b)(1), unless the authorization is terminated or revoked sooner. Performed at [facility], [place] [address], [place], [place] [address]   CBC with Differential/Platelet     Status: Abnormal   Collection Time: 10/17/19  9:03 AM  Result Value Ref Range   WBC 12.6 (H) 4.0 - 10.5 K/uL   RBC 4.06 (L) 4.22 - 5.81 MIL/uL   Hemoglobin 13.4 13.0 - 17.0 g/dL   HCT 40.5 39.0 - 52.0 %   MCV 99.8 80.0 - 100.0 fL   MCH 33.0 26.0 - 34.0 pg   MCHC 33.1 30.0 - 36.0 g/dL   RDW 14.3 11.5 - 15.5 %   Platelets 228 150 - 400 K/uL   nRBC 0.0 0.0 - 0.2 %   Neutrophils Relative % 76 %   Neutro Abs 9.6 (H) 1.7 - 7.7 K/uL   Lymphocytes Relative 13 %   Lymphs  Abs 1.7 0.7 - 4.0 K/uL   Monocytes Relative 7 %   Monocytes Absolute 0.9 0.1 - 1.0 K/uL   Eosinophils Relative 3 %   Eosinophils Absolute 0.4 0.0 - 0.5 K/uL   Basophils Relative 1 %   Basophils Absolute 0.1 0.0 - 0.1 K/uL   Immature Granulocytes 0 %   Abs Immature Granulocytes 0.03 0.00 -  0.07 K/uL    Comment: Performed at [facility], [address] [place], [place] [address]  Protime-INR     Status: None   Collection Time: 10/17/19  9:03 AM  Result Value Ref Range   Prothrombin Time 12.4 11.4 - 15.2 seconds   INR 0.9 0.8 - 1.2    Comment: (NOTE) INR goal varies based on device and disease states. Performed at [facility], [address] [place], [place] [address]   Surgical pathology     Status: None   Collection Time: 10/17/19 11:01 AM  Result Value Ref Range   SURGICAL PATHOLOGY      SURGICAL PATHOLOGY CASE: [phone] [id number] PATIENT: [name] Surgical Pathology Report     Specimen Submitted: A. Lung nodule, LLL  Clinical History: LLL nodule. Lung CA.      DIAGNOSIS: A. LUNG, LEFT LOWER LOBE; CT-GUIDED BIOPSY: - DIAGNOSTIC OF MALIGNANCY. - SQUAMOUS CELL CARCINOMA.   GROSS DESCRIPTION: A. Labeled: Left lower lobe PET positive nodule Received: In formalin Tissue fragment(s): Multiple Size: 0.7 x 0.3 x 0.1 cm Description: Aggregate of pink-red to white tissue fragments Entirely submitted in 1 cassette.   Final Diagnosis performed by Betsy Pries, MD.   Electronically signed 10/18/2019 12:07:14PM The electronic signature indicates that the named Attending Pathologist has evaluated the specimen Technical component performed at [facility], [address] [place], [place] [address] Lab: [phone] Dir: Rush Farmer, MD, MMM  Professional component performed at [facility], [facility], [address], [place], [place] [address] Lab: [phone] Dir: Dellia Nims. Reuel Derby, MD   Surgical pathology     Status: None   Collection Time: 10/17/19 11:02 AM  Result Value Ref Range   SURGICAL PATHOLOGY      SURGICAL PATHOLOGY CASE: [id number] PATIENT: [name] Surgical Pathology Report     Specimen Submitted: A. Parotid mass, left  Clinical History: Parotid mass, LLL lung  mass      DIAGNOSIS: A. PAROTID MASS, LEFT; ULTRASOUND-GUIDED NEEDLE CORE BIOPSY: - PAPILLARY CYSTADENOMA LYMPHOMATOSUM (WARTHIN TUMOR). - NEGATIVE FOR MALIGNANCY.   GROSS DESCRIPTION: A. Labeled: Left parotid PET positive mass Received: In formalin Tissue fragment(s): Multiple Size: 0.9 x 0.3 x 0.1 cm Description: Aggregate of pink-red to white tissue fragments Entirely submitted in 1 cassette.   Final Diagnosis performed by Betsy Pries, MD.   Electronically signed 10/18/2019 9:31:24AM The electronic signature indicates that the named Attending Pathologist has evaluated the specimen Technical component performed at [facility], [place], [address] [place], [place] [address] Lab: [phone] Dir: Rush Farmer, MD, MMM  Professional component performed at [facility], [facility], [place], [place], [place] [address] Lab: [phone] Dir: Dellia Nims. Reuel Derby, MD   PSA     Status: None   Collection Time: 11/04/19  1:40 PM  Result Value Ref Range   Prostate Specific Ag, Serum 0.2 0.0 - 4.0 ng/mL    Comment: Roche ECLIA methodology. According to the American Urological Association, Serum PSA should decrease and remain at undetectable levels after radical prostatectomy. The AUA defines biochemical recurrence as an initial PSA value 0.2 ng/mL or greater followed  by a subsequent confirmatory PSA value 0.2 ng/mL or greater. Values obtained with different assay methods or kits cannot be used interchangeably. Results cannot be interpreted as absolute evidence of the presence or absence of malignant disease.   Surgical pcr screen     Status: None   Collection Time: 11/07/19  9:59 AM   Specimen: Nasal Mucosa; Nasal Swab  Result Value Ref Range   MRSA, PCR NEGATIVE NEGATIVE   Staphylococcus aureus NEGATIVE NEGATIVE    Comment: (NOTE) The Xpert SA Assay (FDA approved for NASAL specimens in patients 44 years of age and older), is one component of a  comprehensive surveillance program. It is not intended to diagnose infection nor to guide or monitor treatment. Performed at [facility], [place] [address] [place], [place] [address]   APTT     Status: None   Collection Time: 11/07/19  9:59 AM  Result Value Ref Range   aPTT 29 24 - 36 seconds    Comment: Performed at [place] [address], [place], [place] [address]  CBC     Status: Abnormal   Collection Time: 11/07/19  9:59 AM  Result Value Ref Range   WBC 6.5 4.0 - 10.5 K/uL   RBC 3.90 (L) 4.22 - 5.81 MIL/uL   Hemoglobin 12.8 (L) 13.0 - 17.0 g/dL   HCT 39.5 39.0 - 52.0 %   MCV 101.3 (H) 80.0 - 100.0 fL   MCH 32.8 26.0 - 34.0 pg   MCHC 32.4 30.0 - 36.0 g/dL   RDW 14.4 11.5 - 15.5 %   Platelets 230 150 - 400 K/uL   nRBC 0.0 0.0 - 0.2 %    Comment: Performed at [facility], [place] [address], [place], [place] [address]  Comprehensive metabolic panel     Status: Abnormal   Collection Time: 11/07/19  9:59 AM  Result Value Ref Range   Sodium 139 135 - 145 mmol/L   Potassium 4.6 3.5 - 5.1 mmol/L   Chloride 108 98 - 111 mmol/L   CO2 20 (L) 22 - 32 mmol/L   Glucose, Bld 100 (H) 70 - 99 mg/dL    Comment: Glucose reference range applies only to samples taken after fasting for at least 8 hours.   BUN 16 8 - 23 mg/dL   Creatinine, Ser 1.17 0.61 - 1.24 mg/dL   Calcium 8.6 (L) 8.9 - 10.3 mg/dL   Total Protein 6.9 6.5 - 8.1 g/dL   Albumin 3.8 3.5 - 5.0 g/dL   AST 20 15 - 41 U/L   ALT 18 0 - 44 U/L   Alkaline Phosphatase 79 38 - 126 U/L   Total Bilirubin 0.5 0.3 - 1.2 mg/dL   GFR calc non Af Amer >60 >60 mL/min   GFR calc Af Amer >60 >60 mL/min   Anion gap 11 5 - 15    Comment: Performed at [facility], [place] [address] [place], [place] [address]  Protime-INR     Status: None   Collection Time: 11/07/19  9:59 AM  Result Value Ref Range   Prothrombin Time 12.9 11.4 - 15.2 seconds   INR 1.0 0.8 - 1.2    Comment: (NOTE) INR goal varies based on device  and disease states. Performed at [facility], [place] [address], [place], [place] [address]   Urinalysis, Routine w reflex microscopic     Status: Abnormal   Collection Time: 11/07/19 10:00 AM  Result Value Ref  Range   Color, Urine YELLOW YELLOW   APPearance CLEAR CLEAR   Specific Gravity, Urine 1.017 1.005 - 1.030   pH 6.0 5.0 - 8.0   Glucose, UA NEGATIVE NEGATIVE mg/dL   Hgb urine dipstick SMALL (A) NEGATIVE   Bilirubin Urine NEGATIVE NEGATIVE   Ketones, ur NEGATIVE NEGATIVE mg/dL   Protein, ur NEGATIVE NEGATIVE mg/dL   Nitrite NEGATIVE NEGATIVE   Leukocytes,Ua NEGATIVE NEGATIVE   RBC / HPF 6-10 0 - 5 RBC/hpf   WBC, UA 0-5 0 - 5 WBC/hpf   Bacteria, UA RARE (A) NONE SEEN   Squamous Epithelial / LPF 0-5 0 - 5   Mucus PRESENT     Comment: Performed at [facility], [place] [address] [place], [place] [address]  Blood gas, arterial on room air     Status: Abnormal   Collection Time: 11/07/19 10:16 AM  Result Value Ref Range   FIO2 21.00    pH, Arterial 7.377 7.350 - 7.450   pCO2 arterial 41.8 32.0 - 48.0 mmHg   pO2, Arterial 87.1 83.0 - 108.0 mmHg   Bicarbonate 24.0 20.0 - 28.0 mmol/L   Acid-base deficit 0.5 0.0 - 2.0 mmol/L   O2 Saturation 95.1 %   Patient temperature 37.0    Collection site LEFT BRACHIAL    Drawn by [id number]    Sample type ARTERIAL DRAW    Allens test (pass/fail) BRACHIAL ARTERY (A) PASS    Comment: Performed at [facility], [place] [address], [place], [place] [address]  Type and screen     Status: None   Collection Time: 11/07/19 10:16 AM  Result Value Ref Range   ABO/RH(D) O POS    Antibody Screen NEG    Sample Expiration      11/10/2019,2359 Performed at [facility], [place] [address] [place], [place] [address]   ABO/Rh     Status: None   Collection Time: 11/07/19 10:16 AM  Result Value Ref Range   ABO/RH(D)      O POS Performed at [place] [address] [place], [place] [address]   SARS CORONAVIRUS 2 (TAT 6-24  HRS) Nasopharyngeal Nasopharyngeal Swab     Status: None   Collection Time: 11/07/19 11:05 AM   Specimen: Nasopharyngeal Swab  Result Value Ref Range   SARS Coronavirus 2 NEGATIVE NEGATIVE    Comment: (NOTE) SARS-CoV-2 target nucleic acids are NOT DETECTED. The SARS-CoV-2 RNA is generally detectable in upper and lower respiratory specimens during the acute phase of infection. Negative results do not preclude SARS-CoV-2 infection, do not rule out co-infections with other pathogens, and should not be used as the sole basis for treatment or other patient management decisions. Negative results must be combined with clinical observations, patient history, and epidemiological information. The expected result is Negative. Fact Sheet for Patients: SugarRoll.be Fact Sheet for Healthcare Providers: https://www.woods-mathews.com/ This test is not yet approved or cleared by the [place] FDA and  has been authorized for detection and/or diagnosis of SARS-CoV-2 by FDA under an Emergency Use Authorization (EUA). This EUA will remain  in effect (meaning this test can be used) for the duration of the COVID-19 declaration under Section 56 4(b)(1) of the Act, 21 U.S.C. section 360bbb-3(b)(1), unless the authorization is terminated or revoked sooner. Performed at [facility], [place] [address] [place], [place] [address]   Urinalysis, Complete     Status: Abnormal   Collection Time: 11/08/19 11:08 AM  Result Value  Ref Range   Specific Gravity, UA 1.020 1.005 - 1.030   pH, UA 6.0 5.0 - 7.5   Color, UA Yellow Yellow   Appearance Ur Clear Clear   Leukocytes,UA Negative Negative   Protein,UA Negative Negative/Trace   Glucose, UA Negative Negative   Ketones, UA Negative Negative   RBC, UA 1+ (A) Negative   Bilirubin, UA Negative Negative   Urobilinogen, Ur 0.2 0.2 - 1.0 mg/dL   Nitrite, UA Negative Negative   Microscopic Examination See below:    Microscopic Examination     Status: Abnormal   Collection Time: 11/08/19 11:08 AM   URINE  Result Value Ref Range   WBC, UA 0-5 0 - 5 /hpf   RBC 3-10 (A) 0 - 2 /hpf   Epithelial Cells (non renal) None seen 0 - 10 /hpf   Bacteria, UA None seen None seen/Few  BLADDER SCAN AMB NON-IMAGING     Status: None   Collection Time: 11/08/19 11:22 AM  Result Value Ref Range   Scan Result 4   Surgical pathology     Status: None   Collection Time: 11/09/19  9:59 AM  Result Value Ref Range   SURGICAL PATHOLOGY      SURGICAL PATHOLOGY CASE: [id number] PATIENT: [name] Surgical Pathology Report     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 LUNG, LEFT LOWER LOBE, WEDGE RESECTION: - Squamous cell carcinoma, 1.6 cm. - Margins not involved. - Visceral pleura not involved. - See oncology table.  B. LYMPH NODE, LEVEL 9, EXCISION: - Anthracotic lymph node with no metastatic carcinoma.  C. LYMPH NODE, LEVEL 9 #2, EXCISION: - Anthracotic lymph node with no metastatic carcinoma.  D. LYMPH NODE, LEVEL 7, EXCISION: - Anthracotic lymph node with no metastatic carcinoma.  E. LYMPH NODE, LEVEL 10, EXCISION: - Anthracotic lymph node with no metastatic carcinoma.  F. LYMPH NODE, LEVEL 5, EXCISION: - Anthracotic lymph node with no metastatic carcinoma.  G. LUNG, LEFT LOWER LOBE #2, WEDGE RESECTION: - Lung lobe with previous wedge site. - No residual carcinoma. - Final margins clear.  ONCOLOGY TABLE:  LUNG:  Resection Procedure: Left lower wedge, lymph node biopsies and the left lower lobectomy. Specimen Laterality: Left. Tumor Site: Left lower lobe. Tumor Size: 1.6 x 1.5 cm. Tumor Focality: Unifocal. Histologic Type: Squamous cell carcinoma. Visceral Pleura Invasion: Not present. Lymphovascular Invasion: Not identified. Direct Invasion of Adjacent Structures: No adjacent structures submitted. Margins: Free of  tumor. Treatment Effect: No known presurgical therapy. Regional Lymph Nodes:      Number of Lymph Nodes Involved: 0      Number of Lymph Nodes Examined: 5 Pathologic Stage Classification (pTNM, AJCC 8th Edition): pT1b, pN0 Ancillary Studies: Can be performed if requested. Representative Tumor Block: A2 and A3. Comment(s): Immunohistochemistry show the tumor is positive with p40, p63, cytokeratin 5/6 and is negative with TTF-1 and Napsin-A consistent with squamous cell carcinoma. (v4.1.0.1)  INTRAOPERATIVE DIAGNOSIS: A.  Left lower lobe lung wedge-freeze marg ins only: "Negative for squamous cell carcinoma" Intraoperative diagnosis rendered by Dr. Jeannie Done at 11:10 AM on 11/09/2019.  GROSS DESCRIPTION: A: The specimen is received fresh for rapid intraoperative consultation and consists of a 5.9 x 3.0 x 2.3 cm, 15 g wedge of lung parenchyma with multiple staple lines.  The serosal surface is red-purple with moderate anthracosis.  A 1.6 x 1.5 cm tan-gray, firm stellate area is identified on the pleural surface, and is  inked black.  The closest resection margin is submitted for frozen section analysis, the specimen is serially sectioned to reveal a 1.8 x 1.6 x 1.1 cm tan-gray, firm, ill-defined lesion.  Lesion abuts the inked pleural surface, measures approximately 1.2 cm from the stapled resection margin.  The uninvolved parenchyma is spongy red-brown.  Representative sections are submitted in 4 cassettes. 1 = margin frozen section remnant 2 and 3 = representative sections of lesion 4 = uninvolved parenchyma B: The specimen  is received in saline and consists of a 0.6 cm tan-gray possible lymph node.  The specimen is entirely submitted in 1 cassette. C: The specimen is received in saline and consists of a 1.5 x 0.6 x 0.3 cm aggregate of tan-gray, anthracotic soft tissue.  The specimen is entirely submitted in 1 cassette. D: The specimen is received in saline and consists of a 1.5 x  0.7 x 0.3 cm tan-gray, anthracotic possible lymph node.  The specimen is entirely submitted in 1 cassette. E: The specimen is received in saline and consists of a 1.0 x 0.8 0.4 cm aggregate of tan-gray, anthracotic soft tissue.  The specimen is entirely submitted in 1 cassette. F: The specimen is received fresh and consists of a 1.9 x 1.4 x 0.3 cm aggregate of tan-gray, anthracotic soft tissue and fat.  The specimen is entirely submitted in 1 cassette. G: The specimen is received fresh and consists of a 9.2 x 5.1 x 1.9 cm, 28 g wedge of lung parenchyma with multiple staple lines.  The pleural surface is red to gr ay-purple with moderate f.  There is a 1.7 x 1.5 cm tan-gray, focally disrupted area noted on the pleural surface.  The area surrounding the defect is inked black, the specimen is serially sectioned to reveal spongy red-brown parenchyma.  No lesions are grossly identified.  Representative sections are submitted in 3 cassettes, to include the pleural surface defect. Craig Staggers 11/11/2019)  Final Diagnosis performed by Claudette Laws, MD.   Electronically signed 11/11/2019 Technical component performed at [employer]. [facility], [place] [address] [place], [place] [address].  Professional component performed at [facility], [place] [address] [place], [place] [address].  Immunohistochemistry Technical component (if applicable) was performed at [facility]. [address] [place], [place], [place] [address].   IMMUNOHISTOCHEMISTRY DISCLAIMER (if applicable): Some of these immunohistochemical stains may have been developed and the performance  characteristics determine by [facility]. Some may not have been cleared or approved by the U.S. Food and Drug Administration. The FDA has determined that such clearance or approval is not necessary. This test is used for clinical purposes. It should not be regarded as investigational or for  research. This laboratory is certified under the [place] (CLIA-88) as qualified to perform high complexity clinical laboratory testing.  The controls stained appropriately.   Glucose, capillary     Status: Abnormal   Collection Time: 11/09/19  5:55 PM  Result Value Ref Range   Glucose-Capillary 125 (H) 70 - 99 mg/dL    Comment: Glucose reference range applies only to samples taken after fasting for at least 8 hours.   Comment 1 Notify RN    Comment 2 Document in Chart   Glucose, capillary     Status: Abnormal   Collection Time: 11/10/19 12:19 AM  Result Value Ref Range   Glucose-Capillary 121 (H) 70 - 99 mg/dL    Comment: Glucose reference range applies only to  samples taken after fasting for at least 8 hours.  CBC     Status: Abnormal   Collection Time: 11/10/19  4:44 AM  Result Value Ref Range   WBC 11.1 (H) 4.0 - 10.5 K/uL   RBC 3.23 (L) 4.22 - 5.81 MIL/uL   Hemoglobin 10.8 (L) 13.0 - 17.0 g/dL   HCT 33.3 (L) 39.0 - 52.0 %   MCV 103.1 (H) 80.0 - 100.0 fL   MCH 33.4 26.0 - 34.0 pg   MCHC 32.4 30.0 - 36.0 g/dL   RDW 14.5 11.5 - 15.5 %   Platelets 208 150 - 400 K/uL   nRBC 0.0 0.0 - 0.2 %    Comment: Performed at [facility], [place] [address], [place], [place] [address]  Basic metabolic panel     Status: Abnormal   Collection Time: 11/10/19  4:44 AM  Result Value Ref Range   Sodium 140 135 - 145 mmol/L   Potassium 4.4 3.5 - 5.1 mmol/L   Chloride 111 98 - 111 mmol/L   CO2 23 22 - 32 mmol/L   Glucose, Bld 130 (H) 70 - 99 mg/dL    Comment: Glucose reference range applies only to samples taken after fasting for at least 8 hours.   BUN 17 8 - 23 mg/dL   Creatinine, Ser 1.23 0.61 - 1.24 mg/dL   Calcium 8.2 (L) 8.9 - 10.3 mg/dL   GFR calc non Af Amer 58 (L) >60 mL/min   GFR calc Af Amer >60 >60 mL/min   Anion gap 6 5 - 15    Comment: Performed at [facility] [address], [place], [place] [address]  Glucose, capillary      Status: Abnormal   Collection Time: 11/10/19  6:05 AM  Result Value Ref Range   Glucose-Capillary 106 (H) 70 - 99 mg/dL    Comment: Glucose reference range applies only to samples taken after fasting for at least 8 hours.  CBC     Status: Abnormal   Collection Time: 11/11/19  6:15 AM  Result Value Ref Range   WBC 9.0 4.0 - 10.5 K/uL   RBC 2.88 (L) 4.22 - 5.81 MIL/uL   Hemoglobin 9.6 (L) 13.0 - 17.0 g/dL   HCT 30.1 (L) 39.0 - 52.0 %   MCV 104.5 (H) 80.0 - 100.0 fL   MCH 33.3 26.0 - 34.0 pg   MCHC 31.9 30.0 - 36.0 g/dL   RDW 14.4 11.5 - 15.5 %   Platelets 171 150 - 400 K/uL   nRBC 0.0 0.0 - 0.2 %    Comment: Performed at [facility], [place] [address], [place], [place] [address]  Comprehensive metabolic panel     Status: Abnormal   Collection Time: 11/11/19  6:15 AM  Result Value Ref Range   Sodium 137 135 - 145 mmol/L   Potassium 4.1 3.5 - 5.1 mmol/L   Chloride 105 98 - 111 mmol/L   CO2 24 22 - 32 mmol/L   Glucose, Bld 99 70 - 99 mg/dL    Comment: Glucose reference range applies only to samples taken after fasting for at least 8 hours.   BUN 22 8 - 23 mg/dL   Creatinine, Ser 1.33 (H) 0.61 - 1.24 mg/dL   Calcium 8.1 (L) 8.9 - 10.3 mg/dL   Total Protein 5.5 (L) 6.5 - 8.1 g/dL   Albumin 3.0 (L) 3.5 - 5.0 g/dL   AST 22 15 - 41 U/L   ALT  12 0 - 44 U/L   Alkaline Phosphatase 53 38 - 126 U/L   Total Bilirubin 0.6 0.3 - 1.2 mg/dL   GFR calc non Af Amer 53 (L) >60 mL/min   GFR calc Af Amer >60 >60 mL/min   Anion gap 8 5 - 15    Comment: Performed at [facility] [address] [place], [place] [address]  Magnesium     Status: None   Collection Time: 11/11/19  4:55 PM  Result Value Ref Range   Magnesium 2.0 1.7 - 2.4 mg/dL    Comment: Performed at [facility] [address] [place], [place] [address]  Phosphorus     Status: Abnormal   Collection Time: 11/11/19  4:55 PM  Result Value Ref Range   Phosphorus 1.8 (L) 2.5 - 4.6 mg/dL    Comment: Performed at  [facility] [address], [place], [place] [address]  Basic metabolic panel     Status: Abnormal   Collection Time: 11/12/19  5:50 AM  Result Value Ref Range   Sodium 140 135 - 145 mmol/L   Potassium 3.9 3.5 - 5.1 mmol/L   Chloride 109 98 - 111 mmol/L   CO2 25 22 - 32 mmol/L   Glucose, Bld 106 (H) 70 - 99 mg/dL    Comment: Glucose reference range applies only to samples taken after fasting for at least 8 hours.   BUN 16 8 - 23 mg/dL   Creatinine, Ser 1.22 0.61 - 1.24 mg/dL   Calcium 8.3 (L) 8.9 - 10.3 mg/dL   GFR calc non Af Amer 58 (L) >60 mL/min   GFR calc Af Amer >60 >60 mL/min   Anion gap 6 5 - 15    Comment: Performed at [facility] [address], [place], [place] [address]  Basic metabolic panel     Status: Abnormal   Collection Time: 11/13/19  3:12 AM  Result Value Ref Range   Sodium 138 135 - 145 mmol/L   Potassium 4.1 3.5 - 5.1 mmol/L   Chloride 107 98 - 111 mmol/L   CO2 25 22 - 32 mmol/L   Glucose, Bld 112 (H) 70 - 99 mg/dL    Comment: Glucose reference range applies only to samples taken after fasting for at least 8 hours.   BUN 15 8 - 23 mg/dL   Creatinine, Ser 1.16 0.61 - 1.24 mg/dL   Calcium 8.5 (L) 8.9 - 10.3 mg/dL   GFR calc non Af Amer >60 >60 mL/min   GFR calc Af Amer >60 >60 mL/min   Anion gap 6 5 - 15    Comment: Performed at [facility] [address] [place], [place] [address]  Urinalysis, Complete     Status: Abnormal   Collection Time: 12/20/19 10:27 AM  Result Value Ref Range   Specific Gravity, UA 1.025 1.005 - 1.030   pH, UA 5.5 5.0 - 7.5   Color, UA Yellow Yellow   Appearance Ur Hazy (A) Clear   Leukocytes,UA Negative Negative   Protein,UA Trace (A) Negative/Trace   Glucose, UA Negative Negative   Ketones, UA Trace (A) Negative   RBC, UA 1+ (A) Negative   Bilirubin, UA Negative Negative   Urobilinogen, Ur 0.2 0.2 - 1.0 mg/dL   Nitrite, UA Negative Negative   Microscopic Examination See below:   Microscopic  Examination     Status: None   Collection Time: 12/20/19 10:27 AM  URINE  Result Value Ref Range   WBC, UA 0-5 0 - 5 /hpf   RBC 0-2 0 - 2 /hpf   Epithelial Cells (non renal) 0-10 0 - 10 /hpf   Bacteria, UA None seen None seen/Few   Objective  Body mass index is 35.26 kg/m. Wt Readings from Last 3 Encounters:  12/28/19 260 lb (117.9 kg)  12/27/19 261 lb (118.4 kg)  12/20/19 260 lb (117.9 kg)   Temp Readings from Last 3 Encounters:  12/28/19 (!) 97 F (36.1 C) (Temporal)  12/27/19 (!) 97.3 F (36.3 C) (Temporal)  12/13/19 97.8 F (36.6 C) (Skin)   BP Readings from Last 3 Encounters:  12/28/19 122/82  12/27/19 100/70  12/20/19 (!) 158/92   Pulse Readings from Last 3 Encounters:  12/28/19 74  12/27/19 76  12/13/19 78    Physical Exam Vitals and nursing note reviewed.  Constitutional:      Appearance: Normal appearance. He is well-developed and well-groomed.  HENT:     Head: Normocephalic and atraumatic.     Ears:     Comments: B/l cerumen impaction  Eyes:     Conjunctiva/sclera: Conjunctivae normal.     Pupils: Pupils are equal, round, and reactive to light.  Cardiovascular:     Rate and Rhythm: Normal rate and regular rhythm.     Heart sounds: Normal heart sounds. No murmur.  Pulmonary:     Effort: Pulmonary effort is normal.     Breath sounds: Normal breath sounds.  Skin:    General: Skin is warm and dry.  Neurological:     General: No focal deficit present.     Mental Status: He is alert and oriented to person, place, and time. Mental status is at baseline.     Gait: Gait normal.  Psychiatric:        Attention and Perception: Attention and perception normal.        Mood and Affect: Mood and affect normal.        Speech: Speech normal.        Behavior: Behavior normal. Behavior is cooperative.        Thought Content: Thought content normal.        Cognition and Memory: Cognition and memory normal.        Judgment: Judgment normal.     Assessment   Plan  Bilateral impacted cerumen Consented to do b/l ear lavage pt tolerated and removed all wax  SCC left lung s/p resection doing well  Malignant neoplasm of lung, unspecified laterality, unspecified part of lung ([place])  F/u CTS and h/o 01/2020   HM Flu shot utd  prevnar utd/ pna 23due in 1 year 04/14/20  Tdapconsider future shingrixconsider future  covid vx per pt had 2/2    COPD + Smoker 1 ppd x 55 years as of 07/2019 and had quit x 2 years in the past  H/o hep C tx'ed [place] last 11/13/15 not detected quant will recheck with hep A immune 09/21/14/hep B >1000 09/21/14 and sAg negative  Alcohol abuse drinks 1 pt liquor qd  H/o prostate cancers/p brachytx with seedsPSA 05/18/19 0.22 f/u Dr.Brandon urology 10/2019 and Dr. Massie Maroon rad/onc Skin-normal exam except for bruising  Colonoscopy 10/04/19 tubullar and hyperplastic Dr. Allen Norris f/u in 5 years  12/22/14 consider in future vs cologuard if qualifies   Neurology Dr. Manuella Ghazi f/u appt 01/06/20 Cards Dr. Fletcher Anon  [facility] psych  Provider: Dr. Olivia Mackie McLean-Scocuzza-Internal Medicine

## 2020-01-06 DIAGNOSIS — Z8546 Personal history of malignant neoplasm of prostate: Secondary | ICD-10-CM | POA: Diagnosis not present

## 2020-01-06 DIAGNOSIS — N189 Chronic kidney disease, unspecified: Secondary | ICD-10-CM | POA: Diagnosis not present

## 2020-01-06 DIAGNOSIS — K219 Gastro-esophageal reflux disease without esophagitis: Secondary | ICD-10-CM | POA: Diagnosis not present

## 2020-01-06 DIAGNOSIS — I25118 Atherosclerotic heart disease of native coronary artery with other forms of angina pectoris: Secondary | ICD-10-CM | POA: Diagnosis not present

## 2020-01-06 DIAGNOSIS — Z6835 Body mass index (BMI) 35.0-35.9, adult: Secondary | ICD-10-CM | POA: Diagnosis not present

## 2020-01-16 DIAGNOSIS — G3184 Mild cognitive impairment, so stated: Secondary | ICD-10-CM | POA: Diagnosis not present

## 2020-01-16 DIAGNOSIS — F329 Major depressive disorder, single episode, unspecified: Secondary | ICD-10-CM | POA: Diagnosis not present

## 2020-01-16 DIAGNOSIS — F419 Anxiety disorder, unspecified: Secondary | ICD-10-CM | POA: Diagnosis not present

## 2020-01-24 DIAGNOSIS — F411 Generalized anxiety disorder: Secondary | ICD-10-CM | POA: Diagnosis not present

## 2020-01-28 ENCOUNTER — Other Ambulatory Visit: Payer: Self-pay | Admitting: Thoracic Surgery (Cardiothoracic Vascular Surgery)

## 2020-02-06 ENCOUNTER — Telehealth: Payer: Self-pay | Admitting: Internal Medicine

## 2020-02-06 NOTE — Telephone Encounter (Signed)
Prior authorization has been submitted for patient's Flomax  Awaiting approval or denial.

## 2020-02-07 NOTE — Telephone Encounter (Signed)
Prior authorization has been approved.  Mychart sent to inform the Patient.

## 2020-02-13 ENCOUNTER — Other Ambulatory Visit: Payer: Self-pay | Admitting: Thoracic Surgery (Cardiothoracic Vascular Surgery)

## 2020-02-13 DIAGNOSIS — C3492 Malignant neoplasm of unspecified part of left bronchus or lung: Secondary | ICD-10-CM

## 2020-02-14 ENCOUNTER — Ambulatory Visit
Admission: RE | Admit: 2020-02-14 | Discharge: 2020-02-14 | Disposition: A | Discharge status: Home / Self Care | Payer: Medicare HMO | Source: Ambulatory Visit | Attending: Thoracic Surgery (Cardiothoracic Vascular Surgery) | Admitting: Thoracic Surgery (Cardiothoracic Vascular Surgery)

## 2020-02-14 ENCOUNTER — Other Ambulatory Visit: Payer: Self-pay

## 2020-02-14 ENCOUNTER — Encounter: Payer: Self-pay | Admitting: Thoracic Surgery (Cardiothoracic Vascular Surgery)

## 2020-02-14 ENCOUNTER — Ambulatory Visit: Payer: Medicare HMO | Admitting: Thoracic Surgery (Cardiothoracic Vascular Surgery)

## 2020-02-14 VITALS — BP 125/84 | HR 74 | Temp 97.5°F | Resp 24 | Ht 72.0 in | Wt 265.0 lb

## 2020-02-14 DIAGNOSIS — C3492 Malignant neoplasm of unspecified part of left bronchus or lung: Secondary | ICD-10-CM

## 2020-02-14 DIAGNOSIS — I700 Atherosclerosis of aorta: Secondary | ICD-10-CM | POA: Diagnosis not present

## 2020-02-14 DIAGNOSIS — R918 Other nonspecific abnormal finding of lung field: Secondary | ICD-10-CM | POA: Diagnosis not present

## 2020-02-14 DIAGNOSIS — J90 Pleural effusion, not elsewhere classified: Secondary | ICD-10-CM | POA: Diagnosis not present

## 2020-02-14 NOTE — Progress Notes (Signed)
[address]       [place] [address]             [phone]      HPI: [name] returns for a scheduled follow-up visit  Christopher Orr is a 74 year old man with a past history of tobacco abuse, COPD, bipolar disorder, early dementia, ethanol abuse, PTSD, depression, hepatitis C, CAD, reflux, hypertension, hyperlipidemia, and pancreatitis.  He was found to have a lung nodule earlier this year.  He had a robotic wedge resection on 11/09/2019 for a stage Ia squamous cell carcinoma.  He had a lot of confusion initially but that ultimately resolved.  He went home on some oxygen but he weaned off of that.  He is having some problems with shortness of breath.  I drained about 2 L of fluid with a thoracentesis back in May.  His breathing improved after that and has remained stable since then.  He does not have any pain related to the surgery.  Past Medical History:  Diagnosis Date  . Alcohol abuse    pt states it is marijuana  . Anemia   . Arthritis   . Bipolar disorder ([place])   . BPH (benign prostatic hypertrophy) with urinary obstruction   . CAD (coronary artery disease)    a. s/p stent to LAD in 2006 at [facility], EF 60% at that time.  . Cancer [facility])    prostate  . CHF (congestive heart failure) ([place])   . Chronic pancreatitis ([place])   . Cirrhosis ([place])   . COPD (chronic obstructive pulmonary disease) ([place])   . Dementia ([place])    early dementia  . Depression   . Diverticulosis   . Drug use   . Dyspnea   . ED (erectile dysfunction)   . Gastritis   . GERD (gastroesophageal reflux disease)   . Headache    occasional migraines  . Hepatitis C    treated  . History of lumbar fusion   . Hyperlipidemia   . Hypertension    patient denies having high blood pressure  . Mitral regurgitation   . Pancreatitis    x 2   . Pneumonia    07/06/18   . PTSD (post-traumatic stress disorder)   . Rib fracture    s/p fall 03/15/19   . Sinus disease   . Urge incontinence      Current Outpatient Medications  Medication Sig Dispense Refill  . albuterol (PROVENTIL) (2.5 MG/3ML) 0.083% nebulizer solution Take 3 mLs (2.5 mg total) by nebulization every 4 (four) hours as needed for wheezing or shortness of breath. 360 mL 3  . albuterol (VENTOLIN HFA) 108 (90 Base) MCG/ACT inhaler Inhale 1-2 puffs into the lungs every 6 (six) hours as needed for wheezing or shortness of breath.    . alprazolam (XANAX) 1 MG tablet Take 1 tablet (1 mg total) by mouth as directed. 1/2 in am 1 pill qhs    . ARIPiprazole (ABILIFY) 10 MG tablet Take 10 mg by mouth daily.    Marland Kitchen aspirin EC 81 MG tablet Take 81 mg by mouth daily.    . budesonide-formoterol (SYMBICORT) 160-4.5 MCG/ACT inhaler Inhale 2 puffs into the lungs 2 (two) times daily. Rinse mouth out 1 Inhaler 1  . celecoxib (CELEBREX) 200 MG capsule Take 1 capsule (200 mg total) by mouth daily after breakfast. 90 capsule 0  . clopidogrel (PLAVIX) 75 MG tablet Take 1 tablet (75 mg total) by mouth daily. Reported on  01/15/2016 90 tablet 3  . donepezil (ARICEPT) 5 MG tablet Take 1 tablet (5 mg total) by mouth at bedtime. 90 tablet 3  . FLUoxetine (PROZAC) 20 MG capsule Take 60 mg by mouth daily.  1  . folic acid (FOLVITE) 1 MG tablet TAKE 1 TABLET BY MOUTH EVERY DAY (Patient taking differently: Take 1 mg by mouth daily. ) 90 tablet 0  . furosemide (LASIX) 40 MG tablet Take 1 tablet (40 mg total) by mouth daily. 5 tablet 0  . memantine (NAMENDA) 5 MG tablet Take 1 tablet (5 mg total) by mouth 2 (two) times daily. Taking qd 180 tablet 3  . metoprolol succinate (TOPROL-XL) 25 MG 24 hr tablet Take 1 tablet (25 mg total) by mouth daily. 90 tablet 3  . mirabegron ER (MYRBETRIQ) 50 MG TB24 tablet Take 1 tablet (50 mg total) by mouth daily. 90 tablet 3  . Multiple Vitamins-Minerals (MULTIVITAMIN WITH MINERALS) tablet Take 1 tablet by mouth daily.    . pantoprazole (PROTONIX) 40 MG tablet Take 1 tablet (40 mg total) by mouth daily. 30 min before  breakfast 90 tablet 3  . potassium chloride 20 MEQ TBCR Take 20 mEq by mouth daily. 5 tablet 0  . rosuvastatin (CRESTOR) 10 MG tablet Take 1 tablet (10 mg total) by mouth at bedtime. (Patient taking differently: Take 10 mg by mouth daily. ) 90 tablet 3  . tamsulosin (FLOMAX) 0.4 MG CAPS capsule Take 1 capsule (0.4 mg total) by mouth daily after supper. (Patient taking differently: Take 0.4 mg by mouth daily. ) 90 capsule 3  . traZODone (DESYREL) 150 MG tablet Take 1 tablet (150 mg total) by mouth at bedtime. 90 tablet 3   No current facility-administered medications for this visit.    Physical Exam BP 125/84 (BP Location: Left Arm)   Pulse 74   Temp (!) 97.5 F (36.4 C) (Temporal)   Resp (!) 24   Ht 6' (1.829 m)   Wt 265 lb (120.2 kg)   SpO2 93% Comment: RA  BMI 35.28 kg/m  74 year old man in no acute distress, not tachypneic at the time of my exam Alert and oriented x 3 Lungs diminished at left base but otherwise clear, no rales or wheezing Cardiac regular rate and rhythm Incisions well-healed  Diagnostic Tests: CHEST - 2 VIEW  COMPARISON:  Dec 13, 2019  FINDINGS: Cardiomediastinal silhouette is normal. Mediastinal contours appear intact. Tortuosity and calcific atherosclerotic disease of the aorta.  Airspace opacity in the left lower lobe. Small to moderate left pleural effusion.  Osseous structures are without acute abnormality. Soft tissues are grossly normal.  IMPRESSION: 1. Consolidation versus atelectasis in the left lower lobe. 2. Small to moderate left pleural effusion.   Electronically Signed   By: Fidela Salisbury M.D.   On: 02/14/2020 13:35 I personally reviewed the chest x-ray images and concur with the findings noted above  Impression: Christopher Orr is a 74 year old man with a past history of tobacco abuse, COPD, bipolar disorder, early dementia, ethanol abuse, PTSD, depression, hepatitis C, CAD, reflux, hypertension, hyperlipidemia, and  pancreatitis.    Stage Ia squamous cell carcinoma-status post lower lobe wedge resection in April.  He saw Dr. Janese Banks in consultation.  No adjuvant therapy was not indicated.  She has some scheduled for a follow-up visit tomorrow to check lab work and will be following him with CT scans as well.  Doing well from a surgical standpoint.  Residual small left pleural effusion unchanged from his post drainage  film 2 months ago.   Plan: Follow-up with Dr. Janese Banks as scheduled I will be happy to see Mr. Maraj back anytime in the future if I can be of any further assistance with his care  Melrose Nakayama, MD [facility] [phone]

## 2020-02-15 ENCOUNTER — Other Ambulatory Visit: Payer: Self-pay | Admitting: *Deleted

## 2020-02-15 ENCOUNTER — Encounter: Payer: Self-pay | Admitting: Oncology

## 2020-02-15 ENCOUNTER — Inpatient Hospital Stay (HOSPITAL_BASED_OUTPATIENT_CLINIC_OR_DEPARTMENT_OTHER): Payer: Medicare HMO | Admitting: Oncology

## 2020-02-15 ENCOUNTER — Inpatient Hospital Stay: Payer: Medicare HMO | Attending: Oncology

## 2020-02-15 VITALS — BP 129/76 | HR 66 | Temp 98.5°F | Resp 16 | Ht 72.0 in | Wt 263.8 lb

## 2020-02-15 DIAGNOSIS — C3492 Malignant neoplasm of unspecified part of left bronchus or lung: Secondary | ICD-10-CM | POA: Diagnosis not present

## 2020-02-15 DIAGNOSIS — D649 Anemia, unspecified: Secondary | ICD-10-CM | POA: Insufficient documentation

## 2020-02-15 DIAGNOSIS — D539 Nutritional anemia, unspecified: Secondary | ICD-10-CM | POA: Diagnosis not present

## 2020-02-15 DIAGNOSIS — Z8546 Personal history of malignant neoplasm of prostate: Secondary | ICD-10-CM

## 2020-02-15 LAB — CBC
HCT: 38.9 % — ABNORMAL LOW (ref 39.0–52.0)
Hemoglobin: 13.2 g/dL (ref 13.0–17.0)
MCH: 32.7 pg (ref 26.0–34.0)
MCHC: 33.9 g/dL (ref 30.0–36.0)
MCV: 96.3 fL (ref 80.0–100.0)
Platelets: 295 10*3/uL (ref 150–400)
RBC: 4.04 MIL/uL — ABNORMAL LOW (ref 4.22–5.81)
RDW: 15.1 % (ref 11.5–15.5)
WBC: 9.3 10*3/uL (ref 4.0–10.5)
nRBC: 0 % (ref 0.0–0.2)

## 2020-02-15 LAB — IRON AND TIBC
Iron: 60 ug/dL (ref 45–182)
Saturation Ratios: 20 % (ref 17.9–39.5)
TIBC: 298 ug/dL (ref 250–450)
UIBC: 238 ug/dL

## 2020-02-15 LAB — FERRITIN: Ferritin: 37 ng/mL (ref 24–336)

## 2020-02-15 LAB — FOLATE: Folate: 18.7 ng/mL (ref >5.9)

## 2020-02-15 LAB — VITAMIN B12: Vitamin B-12: 542 pg/mL (ref 180–914)

## 2020-02-15 NOTE — Progress Notes (Signed)
Hematology/Oncology Consult note [facility]  [phone] [phone]  Patient Care Team: McLean-Scocuzza, [name], MD as PCP - General (Internal Medicine) Wellington Hampshire, MD as PCP - Cardiology (Cardiology) Telford Nab, RN as Oncology Nurse Navigator   Name of the patient: Christopher Orr  [id number]  [birth year]   Date of visit: 02/15/20  Diagnosis- Stage IA lung cancer s/p surgery  Chief complaint/ Reason for visit-routine follow-up of lung cancer  Heme/Onc history: Patient is a 74 year old malewith past medical history significant for hypertension, hepatitis C, cirrhosis, COPD among other medical problems. He is not on any baseline oxygen. Patient underwent CT chest without contrast which showed a 1 cm left lower lobe pulmonary nodule concerning for bronchogenic carcinoma which was new as compared to his prior scan in 2006. No thoracic adenopathy.   PET CT scan showed hypermetabolic left lower lobe lesion measuring 1.2 x 1.2 cm.  No evidence of intrathoracic adenopathy.  Also noted to have hypermetabolic lesion in the left parotid gland 1.7 x 2 cm.  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  Patient underwent wedge resection of the left lower lobe lung nodule which showed a 1.6 cm squamous cell carcinoma of.  No involvement of visceral.  Margins negative. 5 lymph nodes negative for malignancy.  Interval history-reports that his energy levels have improved since his last visit with me in April 2021.  He had an episode of pleural effusion in May 2021 which required drainage.  ECOG PS- 1 Pain scale- 0   Review of systems- Review of Systems  Constitutional: Positive for malaise/fatigue. Negative for chills, fever and weight loss.  HENT: Negative for congestion, ear discharge and nosebleeds.   Eyes: Negative for blurred vision.  Respiratory: Negative for cough, hemoptysis, sputum production,  shortness of breath and wheezing.   Cardiovascular: Negative for chest pain, palpitations, orthopnea and claudication.  Gastrointestinal: Negative for abdominal pain, blood in stool, constipation, diarrhea, heartburn, melena, nausea and vomiting.  Genitourinary: Negative for dysuria, flank pain, frequency, hematuria and urgency.  Musculoskeletal: Negative for back pain, joint pain and myalgias.  Skin: Negative for rash.  Neurological: Negative for dizziness, tingling, focal weakness, seizures, weakness and headaches.  Endo/Heme/Allergies: Does not bruise/bleed easily.  Psychiatric/Behavioral: Negative for depression and suicidal ideas. The patient does not have insomnia.       No Known Allergies   Past Medical History:  Diagnosis Date  . Alcohol abuse    pt states it is marijuana  . Anemia   . Arthritis   . Bipolar disorder ([place])   . BPH (benign prostatic hypertrophy) with urinary obstruction   . CAD (coronary artery disease)    a. s/p stent to LAD in 2006 at [facility], EF 60% at that time.  . Cancer [facility])    prostate  . CHF (congestive heart failure) ([place])   . Chronic pancreatitis ([place])   . Cirrhosis ([place])   . COPD (chronic obstructive pulmonary disease) ([place])   . Dementia ([place])    early dementia  . Depression   . Diverticulosis   . Drug use   . Dyspnea   . ED (erectile dysfunction)   . Gastritis   . GERD (gastroesophageal reflux disease)   . Headache    occasional migraines  . Hepatitis C    treated  . History of lumbar fusion   . Hyperlipidemia   . Hypertension    patient denies having high blood pressure  .  Lung cancer ([place])   . Mitral regurgitation   . Pancreatitis    x 2   . Pneumonia    07/06/18   . PTSD (post-traumatic stress disorder)   . Rib fracture    s/p fall 03/15/19   . Sinus disease   . Urge incontinence      Past Surgical History:  Procedure Laterality Date  . CARDIAC CATHETERIZATION  2006  . cateract  2013  . COLONOSCOPY WITH PROPOFOL N/A  10/04/2019   Procedure: COLONOSCOPY WITH PROPOFOL;  Surgeon: Lucilla Lame, MD;  Location: [facility] ENDOSCOPY;  Service: Endoscopy;  Laterality: N/A;  . CORONARY ANGIOPLASTY  2006  . CORONARY STENT PLACEMENT  2006   x 1  . ESOPHAGOGASTRODUODENOSCOPY N/A 12/22/2014   Procedure: ESOPHAGOGASTRODUODENOSCOPY (EGD);  Surgeon: [name], MD;  Location[facility] ENDOSCOPY;  Service: Endoscopy;  Laterality: N/A;  . ESOPHAGOGASTRODUODENOSCOPY (EGD) WITH PROPOFOL N/A 10/04/2019   Procedure: ESOPHAGOGASTRODUODENOSCOPY (EGD) WITH PROPOFOL;  Surgeon: Lucilla Lame, MD;  Location: [facility] ENDOSCOPY;  Service: Endoscopy;  Laterality: N/A;  . HERNIA REPAIR  2015   left groin  . INTERCOSTAL NERVE BLOCK Left 11/09/2019   Procedure: Intercostal Nerve Block;  Surgeon: Melrose Nakayama, MD;  Location: [place];  Service: Thoracic;  Laterality: Left;  . LUMBAR FUSION    . RADIOACTIVE SEED IMPLANT N/A 04/22/2016   Procedure: RADIOACTIVE SEED IMPLANT/BRACHYTHERAPY IMPLANT;  Surgeon: Hollice Espy, MD;  Location: [facility] ORS;  Service: Urology;  Laterality: N/A;  . ROBOT ASSISTED INGUINAL HERNIA REPAIR Bilateral 07/06/2018   Procedure: ROBOT ASSISTED INGUINAL HERNIA REPAIR;  Surgeon: Jules Husbands, MD;  Location: [facility] ORS;  Service: General;  Laterality: Bilateral;  . TONSILLECTOMY    . UMBILICAL HERNIA REPAIR N/A 07/06/2018   Procedure: LAPAROSCOPIC ROBOT ASSISTED UMBILICAL HERNIA;  Surgeon: Jules Husbands, MD;  Location: [facility] ORS;  Service: General;  Laterality: N/A;  . XI ROBOTIC ASSISTED THORACOSCOPY- SEGMENTECTOMY Left 11/09/2019   Procedure: XI ROBOTIC ASSISTED THORACOSCOPY-WEDGE RESECTION LEFT LOWER LOBE;  Surgeon: Melrose Nakayama, MD;  Location: [place];  Service: Thoracic;  Laterality: Left;    Social History   Socioeconomic History  . Marital status: Divorced    Spouse name: Not on file  . Number of children: 4  . Years of education: Not on file  . Highest education level: GED or equivalent  Occupational  History  . Occupation: Aeronautical engineer    Comment: retired  Tobacco Use  . Smoking status: Former Smoker    Types: Cigarettes    Start date: 07/28/1962    Quit date: 09/01/2019    Years since quitting: 0.4  . Smokeless tobacco: Never Used  Vaping Use  . Vaping Use: Never used  Substance and Sexual Activity  . Alcohol use: Yes    Alcohol/week: 1.0 standard drink    Types: 1 Cans of beer per week    Comment: states he drinks a pint a day (11/07/19)   . Drug use: Yes    Frequency: 7.0 times per week    Types: Marijuana    Comment: Endorsed heroin 06/2014, UDS also + benzos, opiates, THC, neg for cocaine at that time.  Marland Kitchen Sexual activity: Not Currently  Other Topics Concern  . Not on file  Social History Narrative   Lives at home    Has kids and grandkids   Smoker x 55 years 1 ppd as of 07/2019 he did quit x 2 years    Drinks 1 pt liq qd as of 07/2019  Social Determinants of Health   Financial Resource Strain: Low Risk   . Difficulty of Paying Living Expenses: Not very hard  Food Insecurity: Unknown  . Worried About Charity fundraiser in the Last Year: Never true  . Ran Out of Food in the Last Year: Not on file  Transportation Needs:   . Lack of Transportation (Medical):   Marland Kitchen Lack of Transportation (Non-Medical):   Physical Activity:   . Days of Exercise per Week:   . Minutes of Exercise per Session:   Stress:   . Feeling of Stress :   Social Connections:   . Frequency of Communication with Friends and Family:   . Frequency of Social Gatherings with Friends and Family:   . Attends Religious Services:   . Active Member of Clubs or Organizations:   . Attends Archivist Meetings:   Marland Kitchen Marital Status:   Intimate Partner Violence:   . Fear of Current or Ex-Partner:   . Emotionally Abused:   Marland Kitchen Physically Abused:   . Sexually Abused:     Family History  Problem Relation Age of Onset  . Heart disease Father        Unclear details. Father passed in late 55's  2/2 cancer.  . Colon cancer Father   . Alcohol abuse Sister   . Drug abuse Sister   . Anxiety disorder Sister   . Depression Sister   . COPD Brother   . Obesity Brother   . Kidney disease Neg Hx   . Prostate cancer Neg Hx      Current Outpatient Medications:  .  albuterol (PROVENTIL) (2.5 MG/3ML) 0.083% nebulizer solution, Take 3 mLs (2.5 mg total) by nebulization every 4 (four) hours as needed for wheezing or shortness of breath., Disp: 360 mL, Rfl: 3 .  albuterol (VENTOLIN HFA) 108 (90 Base) MCG/ACT inhaler, Inhale 1-2 puffs into the lungs every 6 (six) hours as needed for wheezing or shortness of breath., Disp: , Rfl:  .  alprazolam (XANAX) 1 MG tablet, Take 1 tablet (1 mg total) by mouth as directed. 1/2 in am 1 pill qhs, Disp: , Rfl:  .  ARIPiprazole (ABILIFY) 10 MG tablet, Take 10 mg by mouth daily., Disp: , Rfl:  .  aspirin EC 81 MG tablet, Take 81 mg by mouth daily., Disp: , Rfl:  .  budesonide-formoterol (SYMBICORT) 160-4.5 MCG/ACT inhaler, Inhale 2 puffs into the lungs 2 (two) times daily. Rinse mouth out, Disp: 1 Inhaler, Rfl: 1 .  celecoxib (CELEBREX) 200 MG capsule, Take 1 capsule (200 mg total) by mouth daily after breakfast., Disp: 90 capsule, Rfl: 0 .  clopidogrel (PLAVIX) 75 MG tablet, Take 1 tablet (75 mg total) by mouth daily. Reported on 01/15/2016, Disp: 90 tablet, Rfl: 3 .  donepezil (ARICEPT) 5 MG tablet, Take 1 tablet (5 mg total) by mouth at bedtime., Disp: 90 tablet, Rfl: 3 .  FLUoxetine (PROZAC) 20 MG capsule, Take 60 mg by mouth daily., Disp: , Rfl: 1 .  folic acid (FOLVITE) 1 MG tablet, TAKE 1 TABLET BY MOUTH EVERY DAY (Patient taking differently: Take 1 mg by mouth daily. ), Disp: 90 tablet, Rfl: 0 .  furosemide (LASIX) 40 MG tablet, Take 1 tablet (40 mg total) by mouth daily., Disp: 5 tablet, Rfl: 0 .  memantine (NAMENDA) 5 MG tablet, Take 1 tablet (5 mg total) by mouth 2 (two) times daily. Taking qd, Disp: 180 tablet, Rfl: 3 .  metoprolol succinate  (TOPROL-XL) 25 MG 24  hr tablet, Take 1 tablet (25 mg total) by mouth daily., Disp: 90 tablet, Rfl: 3 .  mirabegron ER (MYRBETRIQ) 50 MG TB24 tablet, Take 1 tablet (50 mg total) by mouth daily., Disp: 90 tablet, Rfl: 3 .  Multiple Vitamins-Minerals (MULTIVITAMIN WITH MINERALS) tablet, Take 1 tablet by mouth daily., Disp: , Rfl:  .  pantoprazole (PROTONIX) 40 MG tablet, Take 1 tablet (40 mg total) by mouth daily. 30 min before breakfast, Disp: 90 tablet, Rfl: 3 .  potassium chloride 20 MEQ TBCR, Take 20 mEq by mouth daily., Disp: 5 tablet, Rfl: 0 .  rosuvastatin (CRESTOR) 10 MG tablet, Take 1 tablet (10 mg total) by mouth at bedtime. (Patient taking differently: Take 10 mg by mouth daily. ), Disp: 90 tablet, Rfl: 3 .  tamsulosin (FLOMAX) 0.4 MG CAPS capsule, Take 1 capsule (0.4 mg total) by mouth daily after supper. (Patient taking differently: Take 0.4 mg by mouth daily. ), Disp: 90 capsule, Rfl: 3 .  traZODone (DESYREL) 150 MG tablet, Take 1 tablet (150 mg total) by mouth at bedtime., Disp: 90 tablet, Rfl: 3  Physical exam:  Vitals:   02/15/20 1155  BP: 129/76  Pulse: 66  Resp: 16  Temp: 98.5 F (36.9 C)  TempSrc: Oral  Weight: 263 lb 12.8 oz (119.7 kg)  Height: 6' (1.829 m)   Physical Exam Cardiovascular:     Rate and Rhythm: Normal rate and regular rhythm.     Heart sounds: Normal heart sounds.  Pulmonary:     Effort: Pulmonary effort is normal.     Breath sounds: Normal breath sounds.  Abdominal:     General: Bowel sounds are normal.     Palpations: Abdomen is soft.  Skin:    General: Skin is warm and dry.  Neurological:     Mental Status: He is alert and oriented to person, place, and time.      CMP Latest Ref Rng & Units 11/13/2019  Glucose 70 - 99 mg/dL 112(H)  BUN 8 - 23 mg/dL 15  Creatinine 0.61 - 1.24 mg/dL 1.16  Sodium 135 - 145 mmol/L 138  Potassium 3.5 - 5.1 mmol/L 4.1  Chloride 98 - 111 mmol/L 107  CO2 22 - 32 mmol/L 25  Calcium 8.9 - 10.3 mg/dL 8.5(L)    Total Protein 6.5 - 8.1 g/dL -  Total Bilirubin 0.3 - 1.2 mg/dL -  Alkaline Phos 38 - 126 U/L -  AST 15 - 41 U/L -  ALT 0 - 44 U/L -   CBC Latest Ref Rng & Units 02/15/2020  WBC 4.0 - 10.5 K/uL 9.3  Hemoglobin 13.0 - 17.0 g/dL 13.2  Hematocrit 39 - 52 % 38.9(L)  Platelets 150 - 400 K/uL 295    No images are attached to the encounter.  DG Chest 2 View  Result Date: 02/14/2020 CLINICAL DATA:  Left lung cancer. EXAM: CHEST - 2 VIEW COMPARISON:  Dec 13, 2019 FINDINGS: Cardiomediastinal silhouette is normal. Mediastinal contours appear intact. Tortuosity and calcific atherosclerotic disease of the aorta. Airspace opacity in the left lower lobe. Small to moderate left pleural effusion. Osseous structures are without acute abnormality. Soft tissues are grossly normal. IMPRESSION: 1. Consolidation versus atelectasis in the left lower lobe. 2. Small to moderate left pleural effusion. Electronically Signed   By: Fidela Salisbury M.D.   On: 02/14/2020 13:35     Assessment and plan- Patient is a 74 y.o. male with stage IA pT1b pN0 cM0 squamous cell carcinoma of the right lobe  status post wedge resection. He is here for routine f/u of lung cancer   Patient reports improvement in his energy level since his last visit with me in April.  In May patient was noted to have increasing shortness of breath and was found to have Pleural effusion which was drained.  He was also seen by Dr. Patsey Berthold following that in June and the cause of the pleural effusion was thought to be postinflammatory.  From a lung cancer standpoint I will plan to get a repeat CT chest with contrast in October 2021 and see him following that.  Macrocytic anemia likely postsurgical.  Presently his hemoglobin is normal and macrocytosis has resolved.   Visit Diagnosis 1. Squamous carcinoma of lung, left ([place])   2. Macrocytic anemia      Dr. Randa Evens, MD, MPH [facility] [phone] 02/15/2020 12:42 PM

## 2020-02-15 NOTE — Progress Notes (Signed)
Pt had lung cancer and had surgery in April. He states he does not breathe as good as before. Had oxygen and weaned off of it. He had Incentive spirometer and used it a little bit and threw it away. He feels like he does not have a good appetite, eats some things, drinks 3 glassed water a day and drinks liquor and 1 a week  Beer, he feels like his taste buds have changed

## 2020-03-20 DIAGNOSIS — F411 Generalized anxiety disorder: Secondary | ICD-10-CM | POA: Diagnosis not present

## 2020-03-23 ENCOUNTER — Other Ambulatory Visit: Payer: Self-pay | Admitting: Internal Medicine

## 2020-03-23 DIAGNOSIS — M159 Polyosteoarthritis, unspecified: Secondary | ICD-10-CM

## 2020-03-23 MED ORDER — CELECOXIB 200 MG PO CAPS: 200.0000 mg | ORAL_CAPSULE | Freq: Every day | ORAL | 0 refills | Status: DC

## 2020-03-27 ENCOUNTER — Encounter: Payer: Self-pay | Admitting: Cardiovascular Disease

## 2020-03-27 ENCOUNTER — Other Ambulatory Visit: Payer: Self-pay

## 2020-03-27 ENCOUNTER — Ambulatory Visit: Payer: Medicare HMO | Admitting: Cardiovascular Disease

## 2020-03-27 VITALS — BP 130/72 | HR 70 | Ht 72.0 in | Wt 264.0 lb

## 2020-03-27 DIAGNOSIS — Z720 Tobacco use: Secondary | ICD-10-CM

## 2020-03-27 DIAGNOSIS — I2510 Atherosclerotic heart disease of native coronary artery without angina pectoris: Secondary | ICD-10-CM

## 2020-03-27 DIAGNOSIS — E785 Hyperlipidemia, unspecified: Secondary | ICD-10-CM | POA: Diagnosis not present

## 2020-03-27 NOTE — Patient Instructions (Signed)

## 2020-03-27 NOTE — Progress Notes (Signed)
Cardiology Office Note   Date:  03/27/2020   ID:  Christopher Orr, DOB 11-11-1945, MRN [id number]  PCP:  McLean-Scocuzza, [name], MD  Cardiologist:   Kathlyn Sacramento, MD   Chief Complaint  Patient presents with  . other    6 month follow up. Patient c.o SOB. Meds reviewed verbally with patient.       History of Present Illness: Christopher Orr is a 74 y.o. male who is here today for follow-up visit regarding coronary artery disease.    He has known history of coronary artery disease status post LAD stenting 2006 with Cypher drug-eluting stent. He has multiple medical problems including prolonged history of tobacco use, COPD, hyperlipidemia, obesity, previous excessive alcohol use and hepatitis C which was treated.  He also has mild dementia. No cardiac events since his stent placement in 2006.  He had a repeat cardiac catheterization in 2011 which showed patent stent with mild restenosis. He continues to smoke 1 pack/day.  He was able to quit smoking at some point with Chantix but relapsed. He had a CT scan of the abdomen in 2019 that showed no evidence of aortic aneurysm.  He was diagnosed with squamous cell cancer of the lung in April and underwent robotic assisted wedge resection of the left lower lobe.  He did not require radiation or chemotherapy.  He has been doing well with no recent chest pain.  He reports stable exertional dyspnea related to his COPD.  He takes his medications regularly.  Unfortunately, he continues to smoke 1 pack/day.   Past Medical History:  Diagnosis Date  . Alcohol abuse    pt states it is marijuana  . Anemia   . Arthritis   . Bipolar disorder ([place])   . BPH (benign prostatic hypertrophy) with urinary obstruction   . CAD (coronary artery disease)    a. s/p stent to LAD in 2006 at [facility], EF 60% at that time.  . Cancer [facility])    prostate  . CHF (congestive heart failure) ([place])   . Chronic pancreatitis ([place])   . Cirrhosis ([place])   . COPD (chronic  obstructive pulmonary disease) ([place])   . Dementia ([place])    early dementia  . Depression   . Diverticulosis   . Drug use   . Dyspnea   . ED (erectile dysfunction)   . Gastritis   . GERD (gastroesophageal reflux disease)   . Headache    occasional migraines  . Hepatitis C    treated  . History of lumbar fusion   . Hyperlipidemia   . Hypertension    patient denies having high blood pressure  . Lung cancer ([place])   . Mitral regurgitation   . Pancreatitis    x 2   . Pneumonia    07/06/18   . PTSD (post-traumatic stress disorder)   . Rib fracture    s/p fall 03/15/19   . Sinus disease   . Urge incontinence     Past Surgical History:  Procedure Laterality Date  . CARDIAC CATHETERIZATION  2006  . cateract  2013  . COLONOSCOPY WITH PROPOFOL N/A 10/04/2019   Procedure: COLONOSCOPY WITH PROPOFOL;  Surgeon: Lucilla Lame, MD;  Location: [facility] ENDOSCOPY;  Service: Endoscopy;  Laterality: N/A;  . CORONARY ANGIOPLASTY  2006  . CORONARY STENT PLACEMENT  2006   x 1  . ESOPHAGOGASTRODUODENOSCOPY N/A 12/22/2014   Procedure: ESOPHAGOGASTRODUODENOSCOPY (EGD);  Surgeon: [name], MD;  Location[facility] ENDOSCOPY;  Service: Endoscopy;  Laterality: N/A;  . ESOPHAGOGASTRODUODENOSCOPY (EGD) WITH PROPOFOL N/A 10/04/2019   Procedure: ESOPHAGOGASTRODUODENOSCOPY (EGD) WITH PROPOFOL;  Surgeon: Lucilla Lame, MD;  Location: [facility] ENDOSCOPY;  Service: Endoscopy;  Laterality: N/A;  . HERNIA REPAIR  2015   left groin  . INTERCOSTAL NERVE BLOCK Left 11/09/2019   Procedure: Intercostal Nerve Block;  Surgeon: Melrose Nakayama, MD;  Location: [place];  Service: Thoracic;  Laterality: Left;  . LUMBAR FUSION    . RADIOACTIVE SEED IMPLANT N/A 04/22/2016   Procedure: RADIOACTIVE SEED IMPLANT/BRACHYTHERAPY IMPLANT;  Surgeon: Hollice Espy, MD;  Location: [facility] ORS;  Service: Urology;  Laterality: N/A;  . ROBOT ASSISTED INGUINAL HERNIA REPAIR Bilateral 07/06/2018   Procedure: ROBOT ASSISTED INGUINAL HERNIA  REPAIR;  Surgeon: Jules Husbands, MD;  Location: [facility] ORS;  Service: General;  Laterality: Bilateral;  . TONSILLECTOMY    . UMBILICAL HERNIA REPAIR N/A 07/06/2018   Procedure: LAPAROSCOPIC ROBOT ASSISTED UMBILICAL HERNIA;  Surgeon: Jules Husbands, MD;  Location: [facility] ORS;  Service: General;  Laterality: N/A;  . XI ROBOTIC ASSISTED THORACOSCOPY- SEGMENTECTOMY Left 11/09/2019   Procedure: XI ROBOTIC ASSISTED THORACOSCOPY-WEDGE RESECTION LEFT LOWER LOBE;  Surgeon: Melrose Nakayama, MD;  Location: [place];  Service: Thoracic;  Laterality: Left;     Current Outpatient Medications  Medication Sig Dispense Refill  . albuterol (PROVENTIL) (2.5 MG/3ML) 0.083% nebulizer solution Take 3 mLs (2.5 mg total) by nebulization every 4 (four) hours as needed for wheezing or shortness of breath. 360 mL 3  . albuterol (VENTOLIN HFA) 108 (90 Base) MCG/ACT inhaler Inhale 1-2 puffs into the lungs every 6 (six) hours as needed for wheezing or shortness of breath.    . alprazolam (XANAX) 1 MG tablet Take 1 tablet (1 mg total) by mouth as directed. 1/2 in am 1 pill qhs    . ARIPiprazole (ABILIFY) 10 MG tablet Take 10 mg by mouth daily.    Marland Kitchen aspirin EC 81 MG tablet Take 81 mg by mouth daily.    . budesonide-formoterol (SYMBICORT) 160-4.5 MCG/ACT inhaler Inhale 2 puffs into the lungs 2 (two) times daily. Rinse mouth out 1 Inhaler 1  . celecoxib (CELEBREX) 200 MG capsule Take 1 capsule (200 mg total) by mouth daily after breakfast. 90 capsule 0  . clopidogrel (PLAVIX) 75 MG tablet Take 1 tablet (75 mg total) by mouth daily. Reported on 01/15/2016 90 tablet 3  . donepezil (ARICEPT) 5 MG tablet Take 1 tablet (5 mg total) by mouth at bedtime. 90 tablet 3  . FLUoxetine (PROZAC) 20 MG capsule Take 60 mg by mouth daily.  1  . folic acid (FOLVITE) 1 MG tablet TAKE 1 TABLET BY MOUTH EVERY DAY (Patient taking differently: Take 1 mg by mouth daily. ) 90 tablet 0  . furosemide (LASIX) 40 MG tablet Take 1 tablet (40 mg total) by  mouth daily. 5 tablet 0  . memantine (NAMENDA) 5 MG tablet Take 1 tablet (5 mg total) by mouth 2 (two) times daily. Taking qd 180 tablet 3  . metoprolol succinate (TOPROL-XL) 25 MG 24 hr tablet Take 1 tablet (25 mg total) by mouth daily. 90 tablet 3  . mirabegron ER (MYRBETRIQ) 50 MG TB24 tablet Take 1 tablet (50 mg total) by mouth daily. 90 tablet 3  . Multiple Vitamins-Minerals (MULTIVITAMIN WITH MINERALS) tablet Take 1 tablet by mouth daily.    . pantoprazole (PROTONIX) 40 MG tablet Take 1 tablet (40 mg total) by mouth daily. 30 min before breakfast 90 tablet 3  . potassium chloride  20 MEQ TBCR Take 20 mEq by mouth daily. 5 tablet 0  . rosuvastatin (CRESTOR) 10 MG tablet Take 1 tablet (10 mg total) by mouth at bedtime. (Patient taking differently: Take 10 mg by mouth daily. ) 90 tablet 3  . tamsulosin (FLOMAX) 0.4 MG CAPS capsule Take 1 capsule (0.4 mg total) by mouth daily after supper. (Patient taking differently: Take 0.4 mg by mouth daily. ) 90 capsule 3  . traZODone (DESYREL) 150 MG tablet Take 1 tablet (150 mg total) by mouth at bedtime. 90 tablet 3   No current facility-administered medications for this visit.    Allergies:   Patient has no known allergies.    Social History:  The patient  reports that he quit smoking about 6 months ago. His smoking use included cigarettes. He started smoking about 57 years ago. He has never used smokeless tobacco. He reports current alcohol use of about 1.0 standard drink of alcohol per week. He reports current drug use. Frequency: 7.00 times per week. Drug: Marijuana.   Family History:  The patient's family history includes Alcohol abuse in his sister; Anxiety disorder in his sister; COPD in his brother; Colon cancer in his father; Depression in his sister; Drug abuse in his sister; Heart disease in his father; Obesity in his brother.    ROS:  Please see the history of present illness.   Otherwise, review of systems are positive for none.   All  other systems are reviewed and negative.    PHYSICAL EXAM: VS:  BP 130/72 (BP Location: Left Arm, Patient Position: Sitting, Cuff Size: Normal)   Pulse 70   Ht 6' (1.829 m)   Wt 264 lb (119.7 kg)   BMI 35.80 kg/m  , BMI Body mass index is 35.8 kg/m. GEN: Well nourished, well developed, in no acute distress  HEENT: normal  Neck: no JVD, carotid bruits, or masses Cardiac: RRR with premature beats; no murmurs, rubs, or gallops,no edema.  Distal pulses are palpable Respiratory:  clear to auscultation bilaterally, normal work of breathing GI: soft, nontender, nondistended, + BS MS: no deformity or atrophy  Skin: warm and dry, no rash Neuro:  Strength and sensation are intact Psych: euthymic mood, full affect   EKG:  EKG is ordered today. The ekg ordered today demonstrates normal sinus rhythm with left axis deviation and mildly prolonged QT interval.   Recent Labs: 09/14/2019: TSH 0.77 11/11/2019: ALT 12; Magnesium 2.0 11/13/2019: BUN 15; Creatinine, Ser 1.16; Potassium 4.1; Sodium 138 02/15/2020: Hemoglobin 13.2; Platelets 295    Lipid Panel    Component Value Date/Time   CHOL 131 09/14/2019 0947   TRIG 133.0 09/14/2019 0947   HDL 48.90 09/14/2019 0947   CHOLHDL 3 09/14/2019 0947   VLDL 26.6 09/14/2019 0947   LDLCALC 55 09/14/2019 0947      Wt Readings from Last 3 Encounters:  03/27/20 264 lb (119.7 kg)  02/15/20 263 lb 12.8 oz (119.7 kg)  02/14/20 265 lb (120.2 kg)       PAD Screen 03/24/2019  Previous PAD dx? No  Previous surgical procedure? No  Pain with walking? No  Feet/toe relief with dangling? No  Painful, non-healing ulcers? No  Extremities discolored? No      ASSESSMENT AND PLAN:  1.  Coronary artery disease involving native coronary arteries without angina: He is doing well overall with no anginal symptoms.  Continue medical therapy.  Given that he has a first generation drug-eluting stent, continue dual antiplatelet therapy indefinitely as  tolerated.  2.  Hyperlipidemia: Continue treatment with rosuvastatin.  Most recent lipid profile showed an LDL of 55.  3.  Tobacco use: I again discussed with him the importance of smoking cessation but he reports inability to quit.  4.  COPD: On inhalers.  5.  Mild dementia: Currently on Aricept and Namenda.    Disposition:   FU with me in 6 months  Signed,  Kathlyn Sacramento, MD  03/27/2020 1:37 PM    [facility]

## 2020-04-06 ENCOUNTER — Ambulatory Visit: Payer: Medicare HMO

## 2020-04-16 ENCOUNTER — Ambulatory Visit (INDEPENDENT_AMBULATORY_CARE_PROVIDER_SITE_OTHER): Payer: Medicare HMO

## 2020-04-16 VITALS — Ht 72.0 in | Wt 264.0 lb

## 2020-04-16 DIAGNOSIS — Z0000 Encounter for general adult medical examination without abnormal findings: Secondary | ICD-10-CM | POA: Diagnosis not present

## 2020-04-16 NOTE — Patient Instructions (Addendum)
Christopher Orr , Thank you for taking time to come for your Medicare Wellness Visit. I appreciate your ongoing commitment to your health goals. Please review the following plan we discussed and let me know if I can assist you in the future.   These are the goals we discussed: Goals      Patient Stated   .  Advanced Care Planning (pt-stated)      To be completed.        This is a list of the screening recommended for you and due dates:  Health Maintenance  Topic Date Due  . Tetanus Vaccine  Never done  . Flu Shot  02/26/2020  . Pneumonia vaccines (2 of 2 - PPSV23) 04/14/2020  . Colon Cancer Screening  10/03/2029  . COVID-19 Vaccine  Completed  .  Hepatitis C: One time screening is recommended by Center for Disease Control  (CDC) for  adults born from [birth year] through 1964.   Completed    Immunizations Immunization History  Administered Date(s) Administered  . Fluad Quad(high Dose 65+) 05/04/2019  . Influenza, High Dose Seasonal PF 04/15/2019  . Influenza,inj,Quad PF,6+ Mos 05/04/2018  . PFIZER SARS-COV-2 Vaccination 09/28/2019, 10/19/2019  . Pneumococcal Conjugate-13 04/15/2019   Advanced directives: Has paperwork but not yet completed.  Conditions/risks identified: none new.   Next appointment: Follow up in one year for your annual wellness visit.   Preventive Care 70 Years and Older, Male Preventive care refers to lifestyle choices and visits with your health care provider that can promote health and wellness. What does preventive care include?  A yearly physical exam. This is also called an annual well check.  Dental exams once or twice a year.  Routine eye exams. Ask your health care provider how often you should have your eyes checked.  Personal lifestyle choices, including:  Daily care of your teeth and gums.  Regular physical activity.  Eating a healthy diet.  Avoiding tobacco and drug use.  Limiting alcohol use.  Practicing safe sex.  Taking low doses of  aspirin every day.  Taking vitamin and mineral supplements as recommended by your health care provider. What happens during an annual well check? The services and screenings done by your health care provider during your annual well check will depend on your age, overall health, lifestyle risk factors, and family history of disease. Counseling  Your health care provider may ask you questions about your:  Alcohol use.  Tobacco use.  Drug use.  Emotional well-being.  Home and relationship well-being.  Sexual activity.  Eating habits.  History of falls.  Memory and ability to understand (cognition).  Work and work Statistician. Screening  You may have the following tests or measurements:  Height, weight, and BMI.  Blood pressure.  Lipid and cholesterol levels. These may be checked every 5 years, or more frequently if you are over 46 years old.  Skin check.  Lung cancer screening. You may have this screening every year starting at age 58 if you have a 30-pack-year history of smoking and currently smoke or have quit within the past 15 years.  Fecal occult blood test (FOBT) of the stool. You may have this test every year starting at age 73.  Flexible sigmoidoscopy or colonoscopy. You may have a sigmoidoscopy every 5 years or a colonoscopy every 10 years starting at age 73.  Prostate cancer screening. Recommendations will vary depending on your family history and other risks.  Hepatitis C blood test.  Hepatitis B blood test.  Sexually transmitted disease (STD) testing.  Diabetes screening. This is done by checking your blood sugar (glucose) after you have not eaten for a while (fasting). You may have this done every 1-3 years.  Abdominal aortic aneurysm (AAA) screening. You may need this if you are a current or former smoker.  Osteoporosis. You may be screened starting at age 27 if you are at high risk. Talk with your health care provider about your test results,  treatment options, and if necessary, the need for more tests. Vaccines  Your health care provider may recommend certain vaccines, such as:  Influenza vaccine. This is recommended every year.  Tetanus, diphtheria, and acellular pertussis (Tdap, Td) vaccine. You may need a Td booster every 10 years.  Zoster vaccine. You may need this after age 67.  Pneumococcal 13-valent conjugate (PCV13) vaccine. One dose is recommended after age 73.  Pneumococcal polysaccharide (PPSV23) vaccine. One dose is recommended after age 73. Talk to your health care provider about which screenings and vaccines you need and how often you need them. This information is not intended to replace advice given to you by your health care provider. Make sure you discuss any questions you have with your health care provider. Document Released: 08/10/2015 Document Revised: 04/02/2016 Document Reviewed: 05/15/2015 Elsevier Interactive Patient Education  2017 [place] Prevention in the Home Falls can cause injuries. They can happen to people of all ages. There are many things you can do to make your home safe and to help prevent falls. What can I do on the outside of my home?  Regularly fix the edges of walkways and driveways and fix any cracks.  Remove anything that might make you trip as you walk through a door, such as a raised step or threshold.  Trim any bushes or trees on the path to your home.  Use bright outdoor lighting.  Clear any walking paths of anything that might make someone trip, such as rocks or tools.  Regularly check to see if handrails are loose or broken. Make sure that both sides of any steps have handrails.  Any raised decks and porches should have guardrails on the edges.  Have any leaves, snow, or ice cleared regularly.  Use sand or salt on walking paths during winter.  Clean up any spills in your garage right away. This includes oil or grease spills. What can I do in the  bathroom?  Use night lights.  Install grab bars by the toilet and in the tub and shower. Do not use towel bars as grab bars.  Use non-skid mats or decals in the tub or shower.  If you need to sit down in the shower, use a plastic, non-slip stool.  Keep the floor dry. Clean up any water that spills on the floor as soon as it happens.  Remove soap buildup in the tub or shower regularly.  Attach bath mats securely with double-sided non-slip rug tape.  Do not have throw rugs and other things on the floor that can make you trip. What can I do in the bedroom?  Use night lights.  Make sure that you have a light by your bed that is easy to reach.  Do not use any sheets or blankets that are too big for your bed. They should not hang down onto the floor.  Have a firm chair that has side arms. You can use this for support while you get dressed.  Do not have throw rugs and other  things on the floor that can make you trip. What can I do in the kitchen?  Clean up any spills right away.  Avoid walking on wet floors.  Keep items that you use a lot in easy-to-reach places.  If you need to reach something above you, use a strong step stool that has a grab bar.  Keep electrical cords out of the way.  Do not use floor polish or wax that makes floors slippery. If you must use wax, use non-skid floor wax.  Do not have throw rugs and other things on the floor that can make you trip. What can I do with my stairs?  Do not leave any items on the stairs.  Make sure that there are handrails on both sides of the stairs and use them. Fix handrails that are broken or loose. Make sure that handrails are as long as the stairways.  Check any carpeting to make sure that it is firmly attached to the stairs. Fix any carpet that is loose or worn.  Avoid having throw rugs at the top or bottom of the stairs. If you do have throw rugs, attach them to the floor with carpet tape.  Make sure that you have a  light switch at the top of the stairs and the bottom of the stairs. If you do not have them, ask someone to add them for you. What else can I do to help prevent falls?  Wear shoes that:  Do not have high heels.  Have rubber bottoms.  Are comfortable and fit you well.  Are closed at the toe. Do not wear sandals.  If you use a stepladder:  Make sure that it is fully opened. Do not climb a closed stepladder.  Make sure that both sides of the stepladder are locked into place.  Ask someone to hold it for you, if possible.  Clearly mark and make sure that you can see:  Any grab bars or handrails.  First and last steps.  Where the edge of each step is.  Use tools that help you move around (mobility aids) if they are needed. These include:  Canes.  Walkers.  Scooters.  Crutches.  Turn on the lights when you go into a dark area. Replace any light bulbs as soon as they burn out.  Set up your furniture so you have a clear path. Avoid moving your furniture around.  If any of your floors are uneven, fix them.  If there are any pets around you, be aware of where they are.  Review your medicines with your doctor. Some medicines can make you feel dizzy. This can increase your chance of falling. Ask your doctor what other things that you can do to help prevent falls. This information is not intended to replace advice given to you by your health care provider. Make sure you discuss any questions you have with your health care provider. Document Released: 05/10/2009 Document Revised: 12/20/2015 Document Reviewed: 08/18/2014 Elsevier Interactive Patient Education  2017 [employer].

## 2020-04-16 NOTE — Progress Notes (Signed)
Subjective:   Christopher Orr is a 74 y.o. male who presents for Medicare Annual/Subsequent preventive examination.  Review of Systems    No ROS.  Medicare Wellness Virtual Visit.  Cardiac Risk Factors include: advanced age (>18men, >85 women);male gender     Objective:    Today's Vitals   04/16/20 0936  Weight: 264 lb (119.7 kg)  Height: 6' (1.829 m)   Body mass index is 35.8 kg/m.  Advanced Directives 04/16/2020 02/15/2020 11/22/2019 11/09/2019 11/07/2019 10/04/2019 09/02/2019  Does Patient Have a Medical Advance Directive? Yes No No No No No No  Does patient want to make changes to medical advance directive? No - Patient declined - - - - - -  Would patient like information on creating a medical advance directive? - Yes (MAU/Ambulatory/Procedural Areas - Information given) No - Patient declined - Yes (MAU/Ambulatory/Procedural Areas - Information given) - No - Patient declined    Current Medications (verified) Outpatient Encounter Medications as of 04/16/2020  Medication Sig  . albuterol (PROVENTIL) (2.5 MG/3ML) 0.083% nebulizer solution Take 3 mLs (2.5 mg total) by nebulization every 4 (four) hours as needed for wheezing or shortness of breath.  Marland Kitchen albuterol (VENTOLIN HFA) 108 (90 Base) MCG/ACT inhaler Inhale 1-2 puffs into the lungs every 6 (six) hours as needed for wheezing or shortness of breath.  . alprazolam (XANAX) 1 MG tablet Take 1 tablet (1 mg total) by mouth as directed. 1/2 in am 1 pill qhs  . ARIPiprazole (ABILIFY) 10 MG tablet Take 10 mg by mouth daily.  Marland Kitchen aspirin EC 81 MG tablet Take 81 mg by mouth daily.  . budesonide-formoterol (SYMBICORT) 160-4.5 MCG/ACT inhaler Inhale 2 puffs into the lungs 2 (two) times daily. Rinse mouth out  . celecoxib (CELEBREX) 200 MG capsule Take 1 capsule (200 mg total) by mouth daily after breakfast.  . clopidogrel (PLAVIX) 75 MG tablet Take 1 tablet (75 mg total) by mouth daily. Reported on 01/15/2016  . donepezil (ARICEPT) 5 MG tablet Take 1  tablet (5 mg total) by mouth at bedtime.  Marland Kitchen FLUoxetine (PROZAC) 20 MG capsule Take 60 mg by mouth daily.  . folic acid (FOLVITE) 1 MG tablet TAKE 1 TABLET BY MOUTH EVERY DAY (Patient taking differently: Take 1 mg by mouth daily. )  . furosemide (LASIX) 40 MG tablet Take 1 tablet (40 mg total) by mouth daily.  . memantine (NAMENDA) 5 MG tablet Take 1 tablet (5 mg total) by mouth 2 (two) times daily. Taking qd  . metoprolol succinate (TOPROL-XL) 25 MG 24 hr tablet Take 1 tablet (25 mg total) by mouth daily.  . mirabegron ER (MYRBETRIQ) 50 MG TB24 tablet Take 1 tablet (50 mg total) by mouth daily.  . Multiple Vitamins-Minerals (MULTIVITAMIN WITH MINERALS) tablet Take 1 tablet by mouth daily.  . pantoprazole (PROTONIX) 40 MG tablet Take 1 tablet (40 mg total) by mouth daily. 30 min before breakfast  . potassium chloride 20 MEQ TBCR Take 20 mEq by mouth daily.  . rosuvastatin (CRESTOR) 10 MG tablet Take 1 tablet (10 mg total) by mouth at bedtime. (Patient taking differently: Take 10 mg by mouth daily. )  . tamsulosin (FLOMAX) 0.4 MG CAPS capsule Take 1 capsule (0.4 mg total) by mouth daily after supper. (Patient taking differently: Take 0.4 mg by mouth daily. )  . traZODone (DESYREL) 150 MG tablet Take 1 tablet (150 mg total) by mouth at bedtime.   No facility-administered encounter medications on file as of 04/16/2020.    Allergies (  verified) Patient has no known allergies.   History: Past Medical History:  Diagnosis Date  . Alcohol abuse    pt states it is marijuana  . Anemia   . Arthritis   . Bipolar disorder ([place])   . BPH (benign prostatic hypertrophy) with urinary obstruction   . CAD (coronary artery disease)    a. s/p stent to LAD in 2006 at [facility], EF 60% at that time.  . Cancer [facility])    prostate  . CHF (congestive heart failure) ([place])   . Chronic pancreatitis ([place])   . Cirrhosis ([place])   . COPD (chronic obstructive pulmonary disease) ([place])   . Dementia ([place])    early dementia  .  Depression   . Diverticulosis   . Drug use   . Dyspnea   . ED (erectile dysfunction)   . Gastritis   . GERD (gastroesophageal reflux disease)   . Headache    occasional migraines  . Hepatitis C    treated  . History of lumbar fusion   . Hyperlipidemia   . Hypertension    patient denies having high blood pressure  . Lung cancer ([place])   . Mitral regurgitation   . Pancreatitis    x 2   . Pneumonia    07/06/18   . PTSD (post-traumatic stress disorder)   . Rib fracture    s/p fall 03/15/19   . Sinus disease   . Urge incontinence    Past Surgical History:  Procedure Laterality Date  . CARDIAC CATHETERIZATION  2006  . cateract  2013  . COLONOSCOPY WITH PROPOFOL N/A 10/04/2019   Procedure: COLONOSCOPY WITH PROPOFOL;  Surgeon: Lucilla Lame, MD;  Location: [facility] ENDOSCOPY;  Service: Endoscopy;  Laterality: N/A;  . CORONARY ANGIOPLASTY  2006  . CORONARY STENT PLACEMENT  2006   x 1  . ESOPHAGOGASTRODUODENOSCOPY N/A 12/22/2014   Procedure: ESOPHAGOGASTRODUODENOSCOPY (EGD);  Surgeon: [name], MD;  Location: [facility] ENDOSCOPY;  Service: Endoscopy;  Laterality: N/A;  . ESOPHAGOGASTRODUODENOSCOPY (EGD) WITH PROPOFOL N/A 10/04/2019   Procedure: ESOPHAGOGASTRODUODENOSCOPY (EGD) WITH PROPOFOL;  Surgeon: Lucilla Lame, MD;  Location: [facility] ENDOSCOPY;  Service: Endoscopy;  Laterality: N/A;  . HERNIA REPAIR  2015   left groin  . INTERCOSTAL NERVE BLOCK Left 11/09/2019   Procedure: Intercostal Nerve Block;  Surgeon: Melrose Nakayama, MD;  Location: [place];  Service: Thoracic;  Laterality: Left;  . LUMBAR FUSION    . RADIOACTIVE SEED IMPLANT N/A 04/22/2016   Procedure: RADIOACTIVE SEED IMPLANT/BRACHYTHERAPY IMPLANT;  Surgeon: Hollice Espy, MD;  Location: [facility] ORS;  Service: Urology;  Laterality: N/A;  . ROBOT ASSISTED INGUINAL HERNIA REPAIR Bilateral 07/06/2018   Procedure: ROBOT ASSISTED INGUINAL HERNIA REPAIR;  Surgeon: Jules Husbands, MD;  Location: [facility] ORS;  Service: General;   Laterality: Bilateral;  . TONSILLECTOMY    . UMBILICAL HERNIA REPAIR N/A 07/06/2018   Procedure: LAPAROSCOPIC ROBOT ASSISTED UMBILICAL HERNIA;  Surgeon: Jules Husbands, MD;  Location: [facility] ORS;  Service: General;  Laterality: N/A;  . XI ROBOTIC ASSISTED THORACOSCOPY- SEGMENTECTOMY Left 11/09/2019   Procedure: XI ROBOTIC ASSISTED THORACOSCOPY-WEDGE RESECTION LEFT LOWER LOBE;  Surgeon: Melrose Nakayama, MD;  Location: [facility] OR;  Service: Thoracic;  Laterality: Left;   Family History  Problem Relation Age of Onset  . Heart disease Father        Unclear details. Father passed in late 61's 2/2 cancer.  . Colon cancer Father   . Alcohol abuse Sister   . Drug abuse Sister   .  Anxiety disorder Sister   . Depression Sister   . COPD Brother   . Obesity Brother   . Kidney disease Neg Hx   . Prostate cancer Neg Hx    Social History   Socioeconomic History  . Marital status: Divorced    Spouse name: Not on file  . Number of children: 4  . Years of education: Not on file  . Highest education level: GED or equivalent  Occupational History  . Occupation: Aeronautical engineer    Comment: retired  Tobacco Use  . Smoking status: Former Smoker    Types: Cigarettes    Start date: 07/28/1962    Quit date: 09/01/2019    Years since quitting: 0.6  . Smokeless tobacco: Never Used  Vaping Use  . Vaping Use: Never used  Substance and Sexual Activity  . Alcohol use: Yes    Alcohol/week: 1.0 standard drink    Types: 1 Cans of beer per week    Comment: states he drinks a pint a day (11/07/19)   . Drug use: Yes    Frequency: 7.0 times per week    Types: Marijuana    Comment: Endorsed heroin 06/2014, UDS also + benzos, opiates, THC, neg for cocaine at that time.  Marland Kitchen Sexual activity: Not Currently  Other Topics Concern  . Not on file  Social History Narrative   Lives at home    Has kids and grandkids   Smoker x 55 years 1 ppd as of 07/2019 he did quit x 2 years    Drinks 1 pt liq qd as of 07/2019      Social Determinants of Health   Financial Resource Strain: Low Risk   . Difficulty of Paying Living Expenses: Not very hard  Food Insecurity: No Food Insecurity  . Worried About Charity fundraiser in the Last Year: Never true  . Ran Out of Food in the Last Year: Never true  Transportation Needs: No Transportation Needs  . Lack of Transportation (Medical): No  . Lack of Transportation (Non-Medical): No  Physical Activity:   . Days of Exercise per Week: Not on file  . Minutes of Exercise per Session: Not on file  Stress: No Stress Concern Present  . Feeling of Stress : Not at all  Social Connections:   . Frequency of Communication with Friends and Family: Not on file  . Frequency of Social Gatherings with Friends and Family: Not on file  . Attends Religious Services: Not on file  . Active Member of Clubs or Organizations: Not on file  . Attends Archivist Meetings: Not on file  . Marital Status: Not on file    Tobacco Counseling Counseling given: Not Answered   Clinical Intake:  Pre-visit preparation completed: Yes        Diabetes: No  How often do you need to have someone help you when you read instructions, pamphlets, or other written materials from your doctor or pharmacy?: 1 - Never  Interpreter Needed?: No      Activities of Daily Living In your present state of health, do you have any difficulty performing the following activities: 04/16/2020 11/07/2019  Hearing? N N  Vision? N N  Difficulty concentrating or making decisions? N Y  Comment - pt has early dementia, on medications  Walking or climbing stairs? N Y  Comment - has left leg pain when he walks and he gets sob  Dressing or bathing? N N  Doing errands, shopping? N N  Preparing Food and eating ? N -  Using the Toilet? N -  In the past six months, have you accidently leaked urine? N -  Do you have problems with loss of bowel control? N -  Managing your Medications? N -  Managing your  Finances? N -  Housekeeping or managing your Housekeeping? N -  Some recent data might be hidden    Patient Care Team: McLean-Scocuzza, [name], MD as PCP - General (Internal Medicine) Wellington Hampshire, MD as PCP - Cardiology (Cardiology) Telford Nab, RN as Oncology Nurse Navigator  Indicate any recent Medical Services you may have received from other than [facility] providers in the past year (date may be approximate).     Assessment:   This is a routine wellness examination for [place].  I connected with [place] today by telephone and verified that I am speaking with the correct person using two identifiers. Location patient: home Location provider: work Persons participating in the virtual visit: patient, Marine scientist.    I discussed the limitations, risks, security and privacy concerns of performing an evaluation and management service by telephone and the availability of in person appointments. The patient expressed understanding and verbally consented to this telephonic visit.    Interactive audio and video telecommunications were attempted between this provider and patient, however failed, due to patient having technical difficulties OR patient did not have access to video capability.  We continued and completed visit with audio only.  Some vital signs may be absent or patient reported.   Hearing/Vision screen  Hearing Screening   125Hz  250Hz  500Hz  1000Hz  2000Hz  3000Hz  4000Hz  6000Hz  8000Hz   Right ear:           Left ear:           Comments: Patient is able to hear conversational tones without difficulty.  No issues reported.   Vision Screening Comments: Wears corrective lenses Visual acuity not assessed, virtual visit.  They have seen their ophthalmologist.    Dietary issues and exercise activities discussed:    Regular diet.  Good water intake.   Goals      Patient Stated   .  Advanced Care Planning (pt-stated)      To be completed.       Depression Screen PHQ 2/9  Scores 04/16/2020 11/15/2019 08/12/2019 04/06/2019 04/09/2017 10/09/2016  PHQ - 2 Score 0 6 0 0 0 0  PHQ- 9 Score - 18 - - - -    Fall Risk Fall Risk  04/16/2020 12/28/2019 11/15/2019 10/21/2019 09/02/2019  Falls in the past year? 0 0 0 0 1  Number falls in past yr: 0 0 0 0 1  Injury with Fall? - 0 0 0 1  Risk for fall due to : - - Impaired balance/gait;Impaired mobility - -  Follow up Falls evaluation completed Falls evaluation completed Falls evaluation completed - -   Handrails in use when climbing stairs? Yes Home free of loose throw rugs in walkways, pet beds, electrical cords, etc? Yes  Adequate lighting in your home to reduce risk of falls? Yes   ASSISTIVE DEVICES UTILIZED TO PREVENT FALLS: Life alert? No  Use of a cane, walker or w/c? No    TIMED UP AND GO: Was the test performed? No . Virtual visit.   Cognitive Function:  Patient is alert and oriented x3.  Notes he manages his own finances without difficulty.  Taking all medication as directed.      6CIT Screen 04/16/2020 04/06/2019  What  Year? 0 points 0 points  What month? 0 points 0 points  What time? 0 points 0 points  Count back from 20 - 0 points  Months in reverse - 0 points  Repeat phrase - 0 points  Total Score - 0    Immunizations Immunization History  Administered Date(s) Administered  . Fluad Quad(high Dose 65+) 05/04/2019  . Influenza, High Dose Seasonal PF 04/15/2019  . Influenza,inj,Quad PF,6+ Mos 05/04/2018  . PFIZER SARS-COV-2 Vaccination 09/28/2019, 10/19/2019  . Pneumococcal Conjugate-13 04/15/2019    TDAP status: Due, Education has been provided regarding the importance of this vaccine. Advised may receive this vaccine at local pharmacy or Health Dept. Aware to provide a copy of the vaccination record if obtained from local pharmacy or Health Dept. Verbalized acceptance and understanding. Deferred.   Health Maintenance Health Maintenance  Topic Date Due  . TETANUS/TDAP  Never done  . INFLUENZA  VACCINE  02/26/2020  . PNA vac Low Risk Adult (2 of 2 - PPSV23) 04/14/2020  . COLONOSCOPY  10/03/2029  . COVID-19 Vaccine  Completed  . Hepatitis C Screening  Completed   Dental Screening: Recommended annual dental exams for proper oral hygiene.  Community Resource Referral / Chronic Care Management: CRR required this visit?  No   CCM required this visit?  No      Plan:   Keep all routine maintenance appointments.   I have personally reviewed and noted the following in the patient's chart:   . Medical and social history . Use of alcohol, tobacco or illicit drugs  . Current medications and supplements . Functional ability and status . Nutritional status . Physical activity . Advanced directives . List of other physicians . Hospitalizations, surgeries, and ER visits in previous 12 months . Vitals . Screenings to include cognitive, depression, and falls . Referrals and appointments  In addition, I have reviewed and discussed with patient certain preventive protocols, quality metrics, and best practice recommendations. A written personalized care plan for preventive services as well as general preventive health recommendations were provided to patient via mychart.     Varney Biles, LPN   [id number]/[id number]

## 2020-04-20 DIAGNOSIS — F419 Anxiety disorder, unspecified: Secondary | ICD-10-CM | POA: Diagnosis not present

## 2020-04-20 DIAGNOSIS — M5416 Radiculopathy, lumbar region: Secondary | ICD-10-CM | POA: Diagnosis not present

## 2020-04-20 DIAGNOSIS — G301 Alzheimer's disease with late onset: Secondary | ICD-10-CM | POA: Diagnosis not present

## 2020-04-20 DIAGNOSIS — F329 Major depressive disorder, single episode, unspecified: Secondary | ICD-10-CM | POA: Diagnosis not present

## 2020-04-20 DIAGNOSIS — M79605 Pain in left leg: Secondary | ICD-10-CM | POA: Diagnosis not present

## 2020-04-20 DIAGNOSIS — F0280 Dementia in other diseases classified elsewhere without behavioral disturbance: Secondary | ICD-10-CM | POA: Diagnosis not present

## 2020-05-07 DIAGNOSIS — F0280 Dementia in other diseases classified elsewhere without behavioral disturbance: Secondary | ICD-10-CM | POA: Insufficient documentation

## 2020-05-11 ENCOUNTER — Telehealth (INDEPENDENT_AMBULATORY_CARE_PROVIDER_SITE_OTHER): Payer: Medicare HMO | Admitting: Internal Medicine

## 2020-05-11 ENCOUNTER — Encounter: Payer: Self-pay | Admitting: Internal Medicine

## 2020-05-11 ENCOUNTER — Other Ambulatory Visit: Payer: Self-pay

## 2020-05-11 VITALS — Ht 72.0 in | Wt 265.0 lb

## 2020-05-11 DIAGNOSIS — R0602 Shortness of breath: Secondary | ICD-10-CM | POA: Diagnosis not present

## 2020-05-11 DIAGNOSIS — L0390 Cellulitis, unspecified: Secondary | ICD-10-CM | POA: Diagnosis not present

## 2020-05-11 DIAGNOSIS — B029 Zoster without complications: Secondary | ICD-10-CM | POA: Diagnosis not present

## 2020-05-11 DIAGNOSIS — C3432 Malignant neoplasm of lower lobe, left bronchus or lung: Secondary | ICD-10-CM | POA: Diagnosis not present

## 2020-05-11 DIAGNOSIS — L989 Disorder of the skin and subcutaneous tissue, unspecified: Secondary | ICD-10-CM | POA: Diagnosis not present

## 2020-05-11 MED ORDER — HYDROCORTISONE 2.5 % EX OINT: TOPICAL_OINTMENT | Freq: Two times a day (BID) | CUTANEOUS | 0 refills | Status: DC

## 2020-05-11 MED ORDER — DOXYCYCLINE HYCLATE 100 MG PO TABS: 100.0000 mg | ORAL_TABLET | Freq: Two times a day (BID) | ORAL | 0 refills | Status: DC

## 2020-05-11 MED ORDER — VALACYCLOVIR HCL 1 G PO TABS: 1000.0000 mg | ORAL_TABLET | Freq: Three times a day (TID) | ORAL | 0 refills | Status: DC

## 2020-05-11 MED ORDER — MUPIROCIN 2 % EX OINT: 1.0000 "application " | TOPICAL_OINTMENT | Freq: Two times a day (BID) | CUTANEOUS | 0 refills | Status: DC

## 2020-05-11 NOTE — Progress Notes (Signed)
Telephone Note  I connected with Christopher Orr  on 05/11/20 at  4:20 PM EDT by telephone and verified that I am speaking with the correct person using two identifiers.  Location patient: home Location provider:work or home office Persons participating in the virtual visit: patient, provider  I discussed the limitations of evaluation and management by telemedicine and the availability of in person appointments. The patient expressed understanding and agreed to proceed.   HPI: 1. S/p lung cancer and wedge resection left lower lung 11/09/19 doing well started back smoking again and still c/o SOB esp with exertion  2. Left arm/elbow lesion reason for visit ? Insect bite vs shingles area looks blistered with scabes and wholes. He does not remember insect bite. It is not oozing or painful but itches and is read around the area and getting worse had x 1 + weeks. Blisters appear to be spreading   ROS: See pertinent positives and negatives per HPI.  Past Medical History:  Diagnosis Date  . Alcohol abuse    pt states it is marijuana  . Anemia   . Arthritis   . Bipolar disorder ([place])   . BPH (benign prostatic hypertrophy) with urinary obstruction   . CAD (coronary artery disease)    a. s/p stent to LAD in 2006 at [facility], EF 60% at that time.  . Cancer [facility])    prostate  . CHF (congestive heart failure) ([place])   . Chronic pancreatitis ([place])   . Cirrhosis ([place])   . COPD (chronic obstructive pulmonary disease) ([place])   . Dementia ([place])    early dementia  . Depression   . Diverticulosis   . Drug use   . Dyspnea   . ED (erectile dysfunction)   . Gastritis   . GERD (gastroesophageal reflux disease)   . Headache    occasional migraines  . Hepatitis C    treated  . History of lumbar fusion   . Hyperlipidemia   . Hypertension    patient denies having high blood pressure  . Lung cancer ([place])   . Mitral regurgitation   . Pancreatitis    x 2   . Pneumonia    07/06/18   . PTSD  (post-traumatic stress disorder)   . Rib fracture    s/p fall 03/15/19   . Sinus disease   . Urge incontinence     Past Surgical History:  Procedure Laterality Date  . CARDIAC CATHETERIZATION  2006  . cateract  2013  . COLONOSCOPY WITH PROPOFOL N/A 10/04/2019   Procedure: COLONOSCOPY WITH PROPOFOL;  Surgeon: Lucilla Lame, MD;  Location: [facility] ENDOSCOPY;  Service: Endoscopy;  Laterality: N/A;  . CORONARY ANGIOPLASTY  2006  . CORONARY STENT PLACEMENT  2006   x 1  . ESOPHAGOGASTRODUODENOSCOPY N/A 12/22/2014   Procedure: ESOPHAGOGASTRODUODENOSCOPY (EGD);  Surgeon: [name], MD;  Location: [facility] ENDOSCOPY;  Service: Endoscopy;  Laterality: N/A;  . ESOPHAGOGASTRODUODENOSCOPY (EGD) WITH PROPOFOL N/A 10/04/2019   Procedure: ESOPHAGOGASTRODUODENOSCOPY (EGD) WITH PROPOFOL;  Surgeon: Lucilla Lame, MD;  Location: [facility] ENDOSCOPY;  Service: Endoscopy;  Laterality: N/A;  . HERNIA REPAIR  2015   left groin  . INTERCOSTAL NERVE BLOCK Left 11/09/2019   Procedure: Intercostal Nerve Block;  Surgeon: Melrose Nakayama, MD;  Location: [place];  Service: Thoracic;  Laterality: Left;  . LUMBAR FUSION    . RADIOACTIVE SEED IMPLANT N/A 04/22/2016   Procedure: RADIOACTIVE SEED IMPLANT/BRACHYTHERAPY IMPLANT;  Surgeon: Hollice Espy, MD;  Location: [facility] ORS;  Service: Urology;  Laterality:  N/A;  . ROBOT ASSISTED INGUINAL HERNIA REPAIR Bilateral 07/06/2018   Procedure: ROBOT ASSISTED INGUINAL HERNIA REPAIR;  Surgeon: Jules Husbands, MD;  Location: [facility] ORS;  Service: General;  Laterality: Bilateral;  . TONSILLECTOMY    . UMBILICAL HERNIA REPAIR N/A 07/06/2018   Procedure: LAPAROSCOPIC ROBOT ASSISTED UMBILICAL HERNIA;  Surgeon: Jules Husbands, MD;  Location: [facility] ORS;  Service: General;  Laterality: N/A;  . XI ROBOTIC ASSISTED THORACOSCOPY- SEGMENTECTOMY Left 11/09/2019   Procedure: XI ROBOTIC ASSISTED THORACOSCOPY-WEDGE RESECTION LEFT LOWER LOBE;  Surgeon: Melrose Nakayama, MD;  Location: [place];  Service:  Thoracic;  Laterality: Left;     Current Outpatient Medications:  .  albuterol (PROVENTIL) (2.5 MG/3ML) 0.083% nebulizer solution, Take 3 mLs (2.5 mg total) by nebulization every 4 (four) hours as needed for wheezing or shortness of breath., Disp: 360 mL, Rfl: 3 .  alprazolam (XANAX) 1 MG tablet, Take 1 tablet (1 mg total) by mouth as directed. 1/2 in am 1 pill qhs, Disp: , Rfl:  .  ARIPiprazole (ABILIFY) 10 MG tablet, Take 10 mg by mouth daily., Disp: , Rfl:  .  aspirin EC 81 MG tablet, Take 81 mg by mouth daily., Disp: , Rfl:  .  budesonide-formoterol (SYMBICORT) 160-4.5 MCG/ACT inhaler, Inhale 2 puffs into the lungs 2 (two) times daily. Rinse mouth out, Disp: 1 Inhaler, Rfl: 1 .  celecoxib (CELEBREX) 200 MG capsule, Take 1 capsule (200 mg total) by mouth daily after breakfast., Disp: 90 capsule, Rfl: 0 .  clopidogrel (PLAVIX) 75 MG tablet, Take 1 tablet (75 mg total) by mouth daily. Reported on 01/15/2016, Disp: 90 tablet, Rfl: 3 .  donepezil (ARICEPT) 5 MG tablet, Take 1 tablet (5 mg total) by mouth at bedtime., Disp: 90 tablet, Rfl: 3 .  FLUoxetine (PROZAC) 20 MG capsule, Take 60 mg by mouth daily., Disp: , Rfl: 1 .  furosemide (LASIX) 40 MG tablet, Take 1 tablet (40 mg total) by mouth daily., Disp: 5 tablet, Rfl: 0 .  memantine (NAMENDA) 5 MG tablet, Take 1 tablet (5 mg total) by mouth 2 (two) times daily. Taking qd, Disp: 180 tablet, Rfl: 3 .  metoprolol succinate (TOPROL-XL) 25 MG 24 hr tablet, Take 1 tablet (25 mg total) by mouth daily., Disp: 90 tablet, Rfl: 3 .  mirabegron ER (MYRBETRIQ) 50 MG TB24 tablet, Take 1 tablet (50 mg total) by mouth daily., Disp: 90 tablet, Rfl: 3 .  Multiple Vitamins-Minerals (MULTIVITAMIN WITH MINERALS) tablet, Take 1 tablet by mouth daily., Disp: , Rfl:  .  pantoprazole (PROTONIX) 40 MG tablet, Take 1 tablet (40 mg total) by mouth daily. 30 min before breakfast, Disp: 90 tablet, Rfl: 3 .  potassium chloride 20 MEQ TBCR, Take 20 mEq by mouth daily., Disp: 5  tablet, Rfl: 0 .  rosuvastatin (CRESTOR) 10 MG tablet, Take 1 tablet (10 mg total) by mouth at bedtime. (Patient taking differently: Take 10 mg by mouth daily. ), Disp: 90 tablet, Rfl: 3 .  tamsulosin (FLOMAX) 0.4 MG CAPS capsule, Take 1 capsule (0.4 mg total) by mouth daily after supper. (Patient taking differently: Take 0.4 mg by mouth daily. ), Disp: 90 capsule, Rfl: 3 .  traZODone (DESYREL) 150 MG tablet, Take 1 tablet (150 mg total) by mouth at bedtime., Disp: 90 tablet, Rfl: 3 .  albuterol (VENTOLIN HFA) 108 (90 Base) MCG/ACT inhaler, Inhale 1-2 puffs into the lungs every 6 (six) hours as needed for wheezing or shortness of breath. (Patient not taking: Reported on 05/11/2020), Disp: ,  Rfl:  .  doxycycline (VIBRA-TABS) 100 MG tablet, Take 1 tablet (100 mg total) by mouth 2 (two) times daily. prn, Disp: 14 tablet, Rfl: 0 .  hydrocortisone 2.5 % ointment, Apply topically 2 (two) times daily. Prn left elbow, Disp: 30 g, Rfl: 0 .  mupirocin ointment (BACTROBAN) 2 %, Apply 1 application topically 2 (two) times daily. Prn, Disp: 22 g, Rfl: 0 .  valACYclovir (VALTREX) 1000 MG tablet, Take 1 tablet (1,000 mg total) by mouth 3 (three) times daily. With food, Disp: 21 tablet, Rfl: 0  EXAM:  VITALS per patient if applicable:  GENERAL: alert, oriented, appears well and in no acute distress  Skin: left arm elbow lesion   PSYCH/NEURO: pleasant and cooperative, no obvious depression or anxiety, speech and thought processing grossly intact  ASSESSMENT AND PLAN:  Discussed the following assessment and plan:  Skin lesion ? Cellulitis vs shingles vs bug bite vs other left elbow Cellulitis, unspecified cellulitis site - Plan: mupirocin ointment (BACTROBAN) 2 %, hydrocortisone 2.5 % ointment, doxycycline (VIBRA-TABS) 100 MG tablet Herpes zoster without complication - Plan: valACYclovir (VALTREX) 1000 MG tablet Pt to come in next week for exam unable to do the video   Malignant neoplasm of lower lobe of  left lung (HCC) s/p LLL resection 11/09/19  SOB (shortness of breath) on exertion rec smoking cessation  -we discussed possible serious and likely etiologies, options for evaluation and workup, limitations of telemedicine visit vs in person visit, treatment, treatment risks and precautions.     I discussed the assessment and treatment plan with the patient. The patient was provided an opportunity to ask questions and all were answered. The patient agreed with the plan and demonstrated an understanding of the instructions.    Time spent 20 min Delorise Jackson, MD

## 2020-05-15 ENCOUNTER — Encounter: Payer: Self-pay | Admitting: Internal Medicine

## 2020-05-15 ENCOUNTER — Ambulatory Visit (INDEPENDENT_AMBULATORY_CARE_PROVIDER_SITE_OTHER): Payer: Medicare HMO | Admitting: Internal Medicine

## 2020-05-15 ENCOUNTER — Other Ambulatory Visit: Payer: Self-pay

## 2020-05-15 VITALS — BP 114/78 | HR 73 | Temp 97.9°F | Ht 72.0 in | Wt 263.2 lb

## 2020-05-15 DIAGNOSIS — R21 Rash and other nonspecific skin eruption: Secondary | ICD-10-CM | POA: Diagnosis not present

## 2020-05-15 DIAGNOSIS — L100 Pemphigus vulgaris: Secondary | ICD-10-CM | POA: Diagnosis not present

## 2020-05-15 DIAGNOSIS — Z125 Encounter for screening for malignant neoplasm of prostate: Secondary | ICD-10-CM

## 2020-05-15 DIAGNOSIS — F411 Generalized anxiety disorder: Secondary | ICD-10-CM | POA: Diagnosis not present

## 2020-05-15 DIAGNOSIS — Z13220 Encounter for screening for lipoid disorders: Secondary | ICD-10-CM | POA: Diagnosis not present

## 2020-05-15 DIAGNOSIS — Z8546 Personal history of malignant neoplasm of prostate: Secondary | ICD-10-CM

## 2020-05-15 DIAGNOSIS — B029 Zoster without complications: Secondary | ICD-10-CM

## 2020-05-15 DIAGNOSIS — F331 Major depressive disorder, recurrent, moderate: Secondary | ICD-10-CM | POA: Diagnosis not present

## 2020-05-15 DIAGNOSIS — C3432 Malignant neoplasm of lower lobe, left bronchus or lung: Secondary | ICD-10-CM | POA: Insufficient documentation

## 2020-05-15 DIAGNOSIS — R0602 Shortness of breath: Secondary | ICD-10-CM

## 2020-05-15 DIAGNOSIS — Z1283 Encounter for screening for malignant neoplasm of skin: Secondary | ICD-10-CM | POA: Diagnosis not present

## 2020-05-15 DIAGNOSIS — R739 Hyperglycemia, unspecified: Secondary | ICD-10-CM | POA: Diagnosis not present

## 2020-05-15 DIAGNOSIS — F4310 Post-traumatic stress disorder, unspecified: Secondary | ICD-10-CM | POA: Diagnosis not present

## 2020-05-15 DIAGNOSIS — F341 Dysthymic disorder: Secondary | ICD-10-CM | POA: Diagnosis not present

## 2020-05-15 HISTORY — DX: Malignant neoplasm of lower lobe, left bronchus or lung: C34.32

## 2020-05-15 HISTORY — DX: Shortness of breath: R06.02

## 2020-05-15 MED ORDER — VALACYCLOVIR HCL 1 G PO TABS: 1000.0000 mg | ORAL_TABLET | Freq: Three times a day (TID) | ORAL | 0 refills | Status: DC

## 2020-05-15 NOTE — Progress Notes (Signed)
Chief Complaint  Patient presents with  . Blister    on both arms    F/u  1. Left arm blistering to ulceration and mild pain and itching x 2 weeks but improving he also has mild blisters right arm   Review of Systems  Constitutional: Negative for weight loss.  HENT: Negative for hearing loss.   Eyes: Negative for blurred vision.  Respiratory: Negative for shortness of breath.   Cardiovascular: Negative for chest pain.  Skin: Positive for itching and rash.   Past Medical History:  Diagnosis Date  . Alcohol abuse    pt states it is marijuana  . Anemia   . Arthritis   . Bipolar disorder ([place])   . BPH (benign prostatic hypertrophy) with urinary obstruction   . CAD (coronary artery disease)    a. s/p stent to LAD in 2006 at [facility], EF 60% at that time.  . Cancer [facility])    prostate  . CHF (congestive heart failure) ([place])   . Chronic pancreatitis ([place])   . Cirrhosis ([place])   . COPD (chronic obstructive pulmonary disease) ([place])   . Dementia ([place])    early dementia  . Depression   . Diverticulosis   . Drug use   . Dyspnea   . ED (erectile dysfunction)   . Gastritis   . GERD (gastroesophageal reflux disease)   . Headache    occasional migraines  . Hepatitis C    treated  . History of lumbar fusion   . Hyperlipidemia   . Hypertension    patient denies having high blood pressure  . Lung cancer ([place])   . Mitral regurgitation   . Pancreatitis    x 2   . Pneumonia    07/06/18   . PTSD (post-traumatic stress disorder)   . Rib fracture    s/p fall 03/15/19   . Sinus disease   . Urge incontinence    Past Surgical History:  Procedure Laterality Date  . CARDIAC CATHETERIZATION  2006  . cateract  2013  . COLONOSCOPY WITH PROPOFOL N/A 10/04/2019   Procedure: COLONOSCOPY WITH PROPOFOL;  Surgeon: Lucilla Lame, MD;  Location: [facility] ENDOSCOPY;  Service: Endoscopy;  Laterality: N/A;  . CORONARY ANGIOPLASTY  2006  . CORONARY STENT PLACEMENT  2006   x 1  . ESOPHAGOGASTRODUODENOSCOPY  N/A 12/22/2014   Procedure: ESOPHAGOGASTRODUODENOSCOPY (EGD);  Surgeon: [name], MD;  Location: [facility] ENDOSCOPY;  Service: Endoscopy;  Laterality: N/A;  . ESOPHAGOGASTRODUODENOSCOPY (EGD) WITH PROPOFOL N/A 10/04/2019   Procedure: ESOPHAGOGASTRODUODENOSCOPY (EGD) WITH PROPOFOL;  Surgeon: Lucilla Lame, MD;  Location: [facility] ENDOSCOPY;  Service: Endoscopy;  Laterality: N/A;  . HERNIA REPAIR  2015   left groin  . INTERCOSTAL NERVE BLOCK Left 11/09/2019   Procedure: Intercostal Nerve Block;  Surgeon: Melrose Nakayama, MD;  Location: [place];  Service: Thoracic;  Laterality: Left;  . LUMBAR FUSION    . RADIOACTIVE SEED IMPLANT N/A 04/22/2016   Procedure: RADIOACTIVE SEED IMPLANT/BRACHYTHERAPY IMPLANT;  Surgeon: Hollice Espy, MD;  Location: [facility] ORS;  Service: Urology;  Laterality: N/A;  . ROBOT ASSISTED INGUINAL HERNIA REPAIR Bilateral 07/06/2018   Procedure: ROBOT ASSISTED INGUINAL HERNIA REPAIR;  Surgeon: Jules Husbands, MD;  Location: [facility] ORS;  Service: General;  Laterality: Bilateral;  . TONSILLECTOMY    . UMBILICAL HERNIA REPAIR N/A 07/06/2018   Procedure: LAPAROSCOPIC ROBOT ASSISTED UMBILICAL HERNIA;  Surgeon: Jules Husbands, MD;  Location: [facility] ORS;  Service: General;  Laterality: N/A;  . XI ROBOTIC ASSISTED THORACOSCOPY-  SEGMENTECTOMY Left 11/09/2019   Procedure: XI ROBOTIC ASSISTED THORACOSCOPY-WEDGE RESECTION LEFT LOWER LOBE;  Surgeon: Melrose Nakayama, MD;  Location[facility];  Service: Thoracic;  Laterality: Left;   Family History  Problem Relation Age of Onset  . Heart disease Father        Unclear details. Father passed in late 39's 2/2 cancer.  . Colon cancer Father   . Alcohol abuse Sister   . Drug abuse Sister   . Anxiety disorder Sister   . Depression Sister   . COPD Brother   . Obesity Brother   . Kidney disease Neg Hx   . Prostate cancer Neg Hx    Social History   Socioeconomic History  . Marital status: Divorced    Spouse name: Not on file  . Number of  children: 4  . Years of education: Not on file  . Highest education level: GED or equivalent  Occupational History  . Occupation: Aeronautical engineer    Comment: retired  Tobacco Use  . Smoking status: Current Every Day Smoker    Packs/day: 1.00    Types: Cigarettes    Start date: 07/28/1962    Last attempt to quit: 09/01/2019    Years since quitting: 0.7  . Smokeless tobacco: Never Used  Vaping Use  . Vaping Use: Never used  Substance and Sexual Activity  . Alcohol use: Yes    Alcohol/week: 1.0 standard drink    Types: 1 Cans of beer per week    Comment: states he drinks a pint a day (11/07/19)   . Drug use: Yes    Frequency: 7.0 times per week    Types: Marijuana    Comment: Endorsed heroin 06/2014, UDS also + benzos, opiates, THC, neg for cocaine at that time.  Marland Kitchen Sexual activity: Not Currently  Other Topics Concern  . Not on file  Social History Narrative   Lives at home    Has kids and grandkids   Smoker x 55 years 1 ppd as of 07/2019 he did quit x 2 years    Drinks 1 pt liq qd as of 07/2019    Social Determinants of Health   Financial Resource Strain: Low Risk   . Difficulty of Paying Living Expenses: Not very hard  Food Insecurity: No Food Insecurity  . Worried About Charity fundraiser in the Last Year: Never true  . Ran Out of Food in the Last Year: Never true  Transportation Needs: No Transportation Needs  . Lack of Transportation (Medical): No  . Lack of Transportation (Non-Medical): No  Physical Activity:   . Days of Exercise per Week: Not on file  . Minutes of Exercise per Session: Not on file  Stress: No Stress Concern Present  . Feeling of Stress : Not at all  Social Connections:   . Frequency of Communication with Friends and Family: Not on file  . Frequency of Social Gatherings with Friends and Family: Not on file  . Attends Religious Services: Not on file  . Active Member of Clubs or Organizations: Not on file  . Attends Archivist Meetings:  Not on file  . Marital Status: Not on file  Intimate Partner Violence: Not At Risk  . Fear of Current or Ex-Partner: No  . Emotionally Abused: No  . Physically Abused: No  . Sexually Abused: No   Current Meds  Medication Sig  . albuterol (PROVENTIL) (2.5 MG/3ML) 0.083% nebulizer solution Take 3 mLs (2.5 mg total) by  nebulization every 4 (four) hours as needed for wheezing or shortness of breath.  Marland Kitchen albuterol (VENTOLIN HFA) 108 (90 Base) MCG/ACT inhaler Inhale 1-2 puffs into the lungs every 6 (six) hours as needed for wheezing or shortness of breath.   . alprazolam (XANAX) 1 MG tablet Take 1 tablet (1 mg total) by mouth as directed. 1/2 in am 1 pill qhs  . ARIPiprazole (ABILIFY) 10 MG tablet Take 10 mg by mouth daily.  Marland Kitchen aspirin EC 81 MG tablet Take 81 mg by mouth daily.  . budesonide-formoterol (SYMBICORT) 160-4.5 MCG/ACT inhaler Inhale 2 puffs into the lungs 2 (two) times daily. Rinse mouth out  . celecoxib (CELEBREX) 200 MG capsule Take 1 capsule (200 mg total) by mouth daily after breakfast.  . clopidogrel (PLAVIX) 75 MG tablet Take 1 tablet (75 mg total) by mouth daily. Reported on 01/15/2016  . donepezil (ARICEPT) 5 MG tablet Take 1 tablet (5 mg total) by mouth at bedtime.  Marland Kitchen doxycycline (VIBRA-TABS) 100 MG tablet Take 1 tablet (100 mg total) by mouth 2 (two) times daily. prn  . FLUoxetine (PROZAC) 20 MG capsule Take 60 mg by mouth daily.  . furosemide (LASIX) 40 MG tablet Take 1 tablet (40 mg total) by mouth daily.  . hydrocortisone 2.5 % ointment Apply topically 2 (two) times daily. Prn left elbow  . memantine (NAMENDA) 5 MG tablet Take 1 tablet (5 mg total) by mouth 2 (two) times daily. Taking qd  . metoprolol succinate (TOPROL-XL) 25 MG 24 hr tablet Take 1 tablet (25 mg total) by mouth daily.  . mirabegron ER (MYRBETRIQ) 50 MG TB24 tablet Take 1 tablet (50 mg total) by mouth daily.  . Multiple Vitamins-Minerals (MULTIVITAMIN WITH MINERALS) tablet Take 1 tablet by mouth daily.  .  mupirocin ointment (BACTROBAN) 2 % Apply 1 application topically 2 (two) times daily. Prn  . pantoprazole (PROTONIX) 40 MG tablet Take 1 tablet (40 mg total) by mouth daily. 30 min before breakfast  . potassium chloride 20 MEQ TBCR Take 20 mEq by mouth daily.  . rosuvastatin (CRESTOR) 10 MG tablet Take 1 tablet (10 mg total) by mouth at bedtime. (Patient taking differently: Take 10 mg by mouth daily. )  . tamsulosin (FLOMAX) 0.4 MG CAPS capsule Take 1 capsule (0.4 mg total) by mouth daily after supper. (Patient taking differently: Take 0.4 mg by mouth daily. )  . traZODone (DESYREL) 150 MG tablet Take 1 tablet (150 mg total) by mouth at bedtime.  . valACYclovir (VALTREX) 1000 MG tablet Take 1 tablet (1,000 mg total) by mouth 3 (three) times daily. With food. Take another 1-2 weeks if better stop after 1 week and before 2 weeks  . [DISCONTINUED] valACYclovir (VALTREX) 1000 MG tablet Take 1 tablet (1,000 mg total) by mouth 3 (three) times daily. With food   No Known Allergies No results found for this or any previous visit (from the past 2160 hour(s)). Objective  Body mass index is 35.7 kg/m. Wt Readings from Last 3 Encounters:  05/15/20 263 lb 3.2 oz (119.4 kg)  05/11/20 265 lb (120.2 kg)  04/16/20 264 lb (119.7 kg)   Temp Readings from Last 3 Encounters:  05/15/20 97.9 F (36.6 C) (Oral)  02/15/20 98.5 F (36.9 C) (Oral)  02/14/20 (!) 97.5 F (36.4 C) (Temporal)   BP Readings from Last 3 Encounters:  05/15/20 114/78  03/27/20 130/72  02/15/20 129/76   Pulse Readings from Last 3 Encounters:  05/15/20 73  03/27/20 70  02/15/20 66  Physical Exam Vitals and nursing note reviewed.  Constitutional:      Appearance: Normal appearance. He is obese.  HENT:     Head: Normocephalic and atraumatic.  Eyes:     Conjunctiva/sclera: Conjunctivae normal.     Pupils: Pupils are equal, round, and reactive to light.  Cardiovascular:     Rate and Rhythm: Normal rate and regular rhythm.       Heart sounds: Normal heart sounds. No murmur heard.   Pulmonary:     Effort: Pulmonary effort is normal.     Breath sounds: Normal breath sounds.  Abdominal:     Tenderness: There is no abdominal tenderness.  Skin:    General: Skin is warm and dry.       Neurological:     General: No focal deficit present.     Mental Status: He is alert and oriented to person, place, and time. Mental status is at baseline.     Gait: Gait normal.  Psychiatric:        Attention and Perception: Attention and perception normal.        Mood and Affect: Mood and affect normal.        Speech: Speech normal.        Behavior: Behavior normal.        Thought Content: Thought content normal.        Cognition and Memory: Cognition and memory normal.        Judgment: Judgment normal.     Assessment  Plan  Herpes zoster without complication - Plan: valACYclovir (VALTREX) 1000 MG tablet tid on 1 week consider another 1-2 weeks, Virus culture  HM Flu shot utd  prevnar utd/pna 23due in 1 year9/18/21 Tdapconsider future shingrixconsider future covid vx per pt had 3/3  COPD + Smoker 1 ppd x 55 years as of 07/2019 and had quit x 2 years in the past  CT chest sch 05/17/20 s/p LLL resection 11/09/19   H/o hep C tx'ed Harvoni last 11/13/15 not detected quant will recheck with hep A immune 09/21/14/hep B >1000 09/21/14 and sAg negative  Alcohol abuse drinks 1 pt liquor qd  H/o prostate cancers/p brachytx with seedsPSA 05/18/19 0.22 f/u Dr.Brandon urology 10/2019 and Dr. Massie Maroon rad/onc PSA ordered with A1C, lipid for labs h/o 05/21/20 Skin-normal exam except for bruising and reason for visit   Colonoscopy 10/04/19 tubular and hyperplastic Dr. Allen Norris f/u in 5 years  12/22/14 consider in future vs cologuard if qualifies   Neurology Dr. Manuella Ghazi f/u appt 01/06/20 Cards Dr. Fletcher Anon  [facility] psych  Provider: Dr. Olivia Mackie McLean-Scocuzza-Internal Medicine

## 2020-05-15 NOTE — Patient Instructions (Addendum)
cerave cream or vasoline with back applicator  Dove body wash   Water 55-64 ounces daily day   Shingles  Shingles, which is also known as herpes zoster, is an infection that causes a painful skin rash and fluid-filled blisters. It is caused by a virus. Shingles only develops in people who:  Have had chickenpox.  Have been given a medicine to protect against chickenpox (have been vaccinated). Shingles is rare in this group. What are the causes? Shingles is caused by varicella-zoster virus (VZV). This is the same virus that causes chickenpox. After a person is exposed to VZV, the virus stays in the body in an inactive (dormant) state. Shingles develops if the virus is reactivated. This can happen many years after the first (initial) exposure to VZV. It is not known what causes this virus to be reactivated. What increases the risk? People who have had chickenpox or received the chickenpox vaccine are at risk for shingles. Shingles infection is more common in people who:  Are older than age 19.  Have a weakened disease-fighting system (immune system), such as people with: ? HIV. ? AIDS. ? Cancer.  Are taking medicines that weaken the immune system, such as transplant medicines.  Are experiencing a lot of stress. What are the signs or symptoms? Early symptoms of this condition include itching, tingling, and pain in an area on your skin. Pain may be described as burning, stabbing, or throbbing. A few days or weeks after early symptoms start, a painful red rash appears. The rash is usually on one side of the body and has a band-like or belt-like pattern. The rash eventually turns into fluid-filled blisters that break open, change into scabs, and dry up in about 2-3 weeks. At any time during the infection, you may also develop:  A fever.  Chills.  A headache.  An upset stomach. How is this diagnosed? This condition is diagnosed with a skin exam. Skin or fluid samples may be taken  from the blisters before a diagnosis is made. These samples are examined under a microscope or sent to a lab for testing. How is this treated? The rash may last for several weeks. There is not a specific cure for this condition. Your health care provider will probably prescribe medicines to help you manage pain, recover more quickly, and avoid long-term problems. Medicines may include:  Antiviral drugs.  Anti-inflammatory drugs.  Pain medicines.  Anti-itching medicines (antihistamines). If the area involved is on your face, you may be referred to a specialist, such as an eye doctor (ophthalmologist) or an ear, nose, and throat (ENT) doctor (otolaryngologist) to help you avoid eye problems, chronic pain, or disability. Follow these instructions at home: Medicines  Take over-the-counter and prescription medicines only as told by your health care provider.  Apply an anti-itch cream or numbing cream to the affected area as told by your health care provider. Relieving itching and discomfort   Apply cold, wet cloths (cold compresses) to the area of the rash or blisters as told by your health care provider.  Cool baths can be soothing. Try adding baking soda or dry oatmeal to the water to reduce itching. Do not bathe in hot water. Blister and rash care  Keep your rash covered with a loose bandage (dressing). Wear loose-fitting clothing to help ease the pain of material rubbing against the rash.  Keep your rash and blisters clean by washing the area with mild soap and cool water as told by your health care provider.  Check your rash every day for signs of infection. Check for: ? More redness, swelling, or pain. ? Fluid or blood. ? Warmth. ? Pus or a bad smell.  Do not scratch your rash or pick at your blisters. To help avoid scratching: ? Keep your fingernails clean and cut short. ? Wear gloves or mittens while you sleep, if scratching is a problem. General instructions  Rest as told  by your health care provider.  Keep all follow-up visits as told by your health care provider. This is important.  Wash your hands often with soap and water. If soap and water are not available, use hand sanitizer. Doing this lowers your chance of getting a bacterial skin infection.  Before your blisters change into scabs, your shingles infection can cause chickenpox in people who have never had it or have never been vaccinated against it. To prevent this from happening, avoid contact with other people, especially: ? Babies. ? Pregnant women. ? Children who have eczema. ? Elderly people who have transplants. ? People who have chronic illnesses, such as cancer or AIDS. Contact a health care provider if:  Your pain is not relieved with prescribed medicines.  Your pain does not get better after the rash heals.  You have signs of infection in the rash area, such as: ? More redness, swelling, or pain around the rash. ? Fluid or blood coming from the rash. ? The rash area feeling warm to the touch. ? Pus or a bad smell coming from the rash. Get help right away if:  The rash is on your face or nose.  You have facial pain, pain around your eye area, or loss of feeling on one side of your face.  You have difficulty seeing.  You have ear pain or have ringing in your ear.  You have a loss of taste.  Your condition gets worse. Summary  Shingles, which is also known as herpes zoster, is an infection that causes a painful skin rash and fluid-filled blisters.  This condition is diagnosed with a skin exam. Skin or fluid samples may be taken from the blisters and examined before the diagnosis is made.  Keep your rash covered with a loose bandage (dressing). Wear loose-fitting clothing to help ease the pain of material rubbing against the rash.  Before your blisters change into scabs, your shingles infection can cause chickenpox in people who have never had it or have never been vaccinated  against it. This information is not intended to replace advice given to you by your health care provider. Make sure you discuss any questions you have with your health care provider. Document Revised: 11/05/2018 Document Reviewed: 03/18/2017 Elsevier Patient Education  2020 [employer].

## 2020-05-16 ENCOUNTER — Telehealth: Payer: Self-pay | Admitting: Internal Medicine

## 2020-05-16 LAB — VIRUS CULTURE

## 2020-05-16 NOTE — Telephone Encounter (Signed)
Received a call from Media at [facility]. She states they received a swab with no orders on this Patient yesterday.  I informed her that an order for a viral culture to check for shingles was ordered yesterday.   She states she now sees the order but the specimen was collected with an "E Swab" and this is not correct. She states the swab should have had a purple top.  Informed her that the information our office received was that red tops could be used for viral cultures.   Levita then states that a purple or red top can be used but they received a white top.  I informed her this could not be correct as this specimen was collected with a red top yesterday. Informed her I would check with the lab to see what to do.

## 2020-05-16 NOTE — Progress Notes (Signed)
We are still going back in forth with [facility] on this issue but they are aware of the situation. We should have an update in the morning on whether they will run it or not.

## 2020-05-17 ENCOUNTER — Ambulatory Visit
Admission: RE | Admit: 2020-05-17 | Discharge: 2020-05-17 | Disposition: A | Discharge status: Home / Self Care | Payer: Medicare HMO | Source: Ambulatory Visit | Attending: Oncology | Admitting: Oncology

## 2020-05-17 ENCOUNTER — Other Ambulatory Visit: Payer: Self-pay

## 2020-05-17 DIAGNOSIS — C3492 Malignant neoplasm of unspecified part of left bronchus or lung: Secondary | ICD-10-CM | POA: Diagnosis not present

## 2020-05-17 DIAGNOSIS — I700 Atherosclerosis of aorta: Secondary | ICD-10-CM | POA: Diagnosis not present

## 2020-05-17 DIAGNOSIS — J90 Pleural effusion, not elsewhere classified: Secondary | ICD-10-CM | POA: Diagnosis not present

## 2020-05-17 DIAGNOSIS — J432 Centrilobular emphysema: Secondary | ICD-10-CM | POA: Diagnosis not present

## 2020-05-17 LAB — POCT I-STAT CREATININE: Creatinine, Ser: 1.1 mg/dL (ref 0.61–1.24)

## 2020-05-17 MED ORDER — IOHEXOL 300 MG/ML  SOLN
75.0000 mL | Freq: Once | INTRAMUSCULAR | Status: AC | PRN
  Administered 2020-05-17: 75 mL via INTRAVENOUS

## 2020-05-17 NOTE — Telephone Encounter (Signed)
After multiple calls & speaking to several agents at [facility], I received a call from Lady Gary who pulled the specimen & verified that we DID sent the correct viral tube. She has reactivated the order has it being tested now. She apologized for the trouble but once it was received, it was marked by someone as a E-swab which would automatically reject it.

## 2020-05-17 NOTE — Telephone Encounter (Signed)
Patient called in about his results from labs

## 2020-05-17 NOTE — Telephone Encounter (Signed)
Patient informed and verbalized understanding of the situation with [facility]. Informed him that I will give him a call as soon as they come in.

## 2020-05-21 ENCOUNTER — Telehealth: Payer: Self-pay | Admitting: Internal Medicine

## 2020-05-21 ENCOUNTER — Encounter: Payer: Self-pay | Admitting: Oncology

## 2020-05-21 ENCOUNTER — Other Ambulatory Visit: Payer: Self-pay

## 2020-05-21 ENCOUNTER — Inpatient Hospital Stay (HOSPITAL_BASED_OUTPATIENT_CLINIC_OR_DEPARTMENT_OTHER): Payer: Medicare HMO | Admitting: Oncology

## 2020-05-21 ENCOUNTER — Inpatient Hospital Stay: Payer: Medicare HMO | Attending: Oncology

## 2020-05-21 VITALS — BP 129/78 | HR 72 | Temp 98.0°F | Resp 20 | Wt 264.9 lb

## 2020-05-21 DIAGNOSIS — K7460 Unspecified cirrhosis of liver: Secondary | ICD-10-CM | POA: Insufficient documentation

## 2020-05-21 DIAGNOSIS — E785 Hyperlipidemia, unspecified: Secondary | ICD-10-CM | POA: Diagnosis not present

## 2020-05-21 DIAGNOSIS — F319 Bipolar disorder, unspecified: Secondary | ICD-10-CM | POA: Insufficient documentation

## 2020-05-21 DIAGNOSIS — Z85118 Personal history of other malignant neoplasm of bronchus and lung: Secondary | ICD-10-CM | POA: Diagnosis not present

## 2020-05-21 DIAGNOSIS — I110 Hypertensive heart disease with heart failure: Secondary | ICD-10-CM | POA: Insufficient documentation

## 2020-05-21 DIAGNOSIS — F4310 Post-traumatic stress disorder, unspecified: Secondary | ICD-10-CM | POA: Insufficient documentation

## 2020-05-21 DIAGNOSIS — J90 Pleural effusion, not elsewhere classified: Secondary | ICD-10-CM | POA: Insufficient documentation

## 2020-05-21 DIAGNOSIS — C3492 Malignant neoplasm of unspecified part of left bronchus or lung: Secondary | ICD-10-CM

## 2020-05-21 DIAGNOSIS — F17210 Nicotine dependence, cigarettes, uncomplicated: Secondary | ICD-10-CM | POA: Diagnosis not present

## 2020-05-21 DIAGNOSIS — K861 Other chronic pancreatitis: Secondary | ICD-10-CM | POA: Diagnosis not present

## 2020-05-21 DIAGNOSIS — Z8546 Personal history of malignant neoplasm of prostate: Secondary | ICD-10-CM | POA: Diagnosis not present

## 2020-05-21 DIAGNOSIS — I2510 Atherosclerotic heart disease of native coronary artery without angina pectoris: Secondary | ICD-10-CM | POA: Insufficient documentation

## 2020-05-21 DIAGNOSIS — J449 Chronic obstructive pulmonary disease, unspecified: Secondary | ICD-10-CM | POA: Insufficient documentation

## 2020-05-21 DIAGNOSIS — I509 Heart failure, unspecified: Secondary | ICD-10-CM | POA: Insufficient documentation

## 2020-05-21 DIAGNOSIS — K219 Gastro-esophageal reflux disease without esophagitis: Secondary | ICD-10-CM | POA: Insufficient documentation

## 2020-05-21 LAB — CBC WITH DIFFERENTIAL/PLATELET
Abs Immature Granulocytes: 0.02 10*3/uL (ref 0.00–0.07)
Basophils Absolute: 0.1 10*3/uL (ref 0.0–0.1)
Basophils Relative: 1 %
Eosinophils Absolute: 0.3 10*3/uL (ref 0.0–0.5)
Eosinophils Relative: 3 %
HCT: 37.8 % — ABNORMAL LOW (ref 39.0–52.0)
Hemoglobin: 12.8 g/dL — ABNORMAL LOW (ref 13.0–17.0)
Immature Granulocytes: 0 %
Lymphocytes Relative: 21 %
Lymphs Abs: 1.9 10*3/uL (ref 0.7–4.0)
MCH: 33.2 pg (ref 26.0–34.0)
MCHC: 33.9 g/dL (ref 30.0–36.0)
MCV: 97.9 fL (ref 80.0–100.0)
Monocytes Absolute: 0.7 10*3/uL (ref 0.1–1.0)
Monocytes Relative: 7 %
Neutro Abs: 6.4 10*3/uL (ref 1.7–7.7)
Neutrophils Relative %: 68 %
Platelets: 259 10*3/uL (ref 150–400)
RBC: 3.86 MIL/uL — ABNORMAL LOW (ref 4.22–5.81)
RDW: 15 % (ref 11.5–15.5)
WBC: 9.4 10*3/uL (ref 4.0–10.5)
nRBC: 0 % (ref 0.0–0.2)

## 2020-05-21 LAB — COMPREHENSIVE METABOLIC PANEL
ALT: 17 U/L (ref 0–44)
AST: 19 U/L (ref 15–41)
Albumin: 3.8 g/dL (ref 3.5–5.0)
Alkaline Phosphatase: 94 U/L (ref 38–126)
Anion gap: 7 (ref 5–15)
BUN: 16 mg/dL (ref 8–23)
CO2: 27 mmol/L (ref 22–32)
Calcium: 8.4 mg/dL — ABNORMAL LOW (ref 8.9–10.3)
Chloride: 104 mmol/L (ref 98–111)
Creatinine, Ser: 1.07 mg/dL (ref 0.61–1.24)
GFR, Estimated: >60 mL/min (ref >60)
Glucose, Bld: 110 mg/dL — ABNORMAL HIGH (ref 70–99)
Potassium: 4.4 mmol/L (ref 3.5–5.1)
Sodium: 138 mmol/L (ref 135–145)
Total Bilirubin: 0.6 mg/dL (ref 0.3–1.2)
Total Protein: 7.3 g/dL (ref 6.5–8.1)

## 2020-05-21 LAB — LIPID PANEL
Cholesterol: 111 mg/dL (ref 0–200)
HDL: 49 mg/dL (ref >40)
LDL Cholesterol: 47 mg/dL (ref 0–99)
Total CHOL/HDL Ratio: 2.3 RATIO
Triglycerides: 77 mg/dL (ref <150)
VLDL: 15 mg/dL (ref 0–40)

## 2020-05-21 LAB — PSA: Prostatic Specific Antigen: <0.01 ng/mL (ref 0.00–4.00)

## 2020-05-21 NOTE — Telephone Encounter (Signed)
Patient called office to get his lab results that was done at Dr. Claris Gladden office.

## 2020-05-21 NOTE — Telephone Encounter (Signed)
All labs have not been completed yet. Once provider reviews we will notify patient of results.

## 2020-05-22 NOTE — Telephone Encounter (Signed)
Christopher Glow McLean-Scocuzza, MD  05/21/2020  6:36 AM EDT Back to Top    Preliminary report no virus isolated but will be repeat in 5 days  I hope the rash is getting better if now please let me know    Patient informed and verbalized understanding.  States the rash is the same, he still has the blisters.   Does Patient need to come in for another appointment in 5 days?

## 2020-05-22 NOTE — Telephone Encounter (Signed)
Patient called in  to get his lab results

## 2020-05-23 ENCOUNTER — Other Ambulatory Visit: Payer: Medicare HMO

## 2020-05-23 ENCOUNTER — Inpatient Hospital Stay: Payer: Medicare HMO

## 2020-05-23 ENCOUNTER — Other Ambulatory Visit: Payer: Self-pay

## 2020-05-23 ENCOUNTER — Encounter: Payer: Self-pay | Admitting: Oncology

## 2020-05-23 DIAGNOSIS — Z85118 Personal history of other malignant neoplasm of bronchus and lung: Secondary | ICD-10-CM | POA: Diagnosis not present

## 2020-05-23 DIAGNOSIS — K7460 Unspecified cirrhosis of liver: Secondary | ICD-10-CM | POA: Diagnosis not present

## 2020-05-23 DIAGNOSIS — Z8546 Personal history of malignant neoplasm of prostate: Secondary | ICD-10-CM | POA: Diagnosis not present

## 2020-05-23 DIAGNOSIS — J449 Chronic obstructive pulmonary disease, unspecified: Secondary | ICD-10-CM | POA: Diagnosis not present

## 2020-05-23 DIAGNOSIS — F17210 Nicotine dependence, cigarettes, uncomplicated: Secondary | ICD-10-CM | POA: Diagnosis not present

## 2020-05-23 DIAGNOSIS — I110 Hypertensive heart disease with heart failure: Secondary | ICD-10-CM | POA: Diagnosis not present

## 2020-05-23 DIAGNOSIS — E785 Hyperlipidemia, unspecified: Secondary | ICD-10-CM | POA: Diagnosis not present

## 2020-05-23 DIAGNOSIS — J90 Pleural effusion, not elsewhere classified: Secondary | ICD-10-CM | POA: Diagnosis not present

## 2020-05-23 DIAGNOSIS — F319 Bipolar disorder, unspecified: Secondary | ICD-10-CM | POA: Diagnosis not present

## 2020-05-23 LAB — PSA: Prostatic Specific Antigen: <0.01 ng/mL (ref 0.00–4.00)

## 2020-05-23 NOTE — Telephone Encounter (Signed)
Referring pt [facility] urgently

## 2020-05-23 NOTE — Addendum Note (Signed)
Addended by: Orland Mustard on: 05/23/2020 07:10 AM   Modules accepted: Orders

## 2020-05-23 NOTE — Telephone Encounter (Signed)
Pt called to get lab results °

## 2020-05-23 NOTE — Telephone Encounter (Signed)
Patient informed and verbalized understanding.  Will await call from dermatology

## 2020-05-23 NOTE — Progress Notes (Signed)
Hematology/Oncology Consult note [facility]  Telephone:([phone] [phone]  Patient Care Team: McLean-Scocuzza, [name], MD as PCP - General (Internal Medicine) Wellington Hampshire, MD as PCP - Cardiology (Cardiology) Telford Nab, RN as Oncology Nurse Navigator   Name of the patient: Christopher Orr  [id number]  Jul 20, 1946   Date of visit: 05/23/20  Diagnosis- Stage IA lung cancer s/p surgery  Chief complaint/ Reason for visit- routine f/u of lung cancer  Heme/Onc history: Patient is a 74 year old malewith past medical history significant for hypertension, hepatitis C, cirrhosis, COPD among other medical problems. He is not on any baseline oxygen. Patient underwent CT chest without contrast which showed a 1 cm left lower lobe pulmonary nodule concerning for bronchogenic carcinoma which was new as compared to his prior scan in 2006. No thoracic adenopathy.  PET CT scan showed hypermetabolic left lower lobe lesion measuring 1.2 x 1.2 cm. No evidence of intrathoracic adenopathy. Also noted to have hypermetabolic lesion in the left parotid gland 1.7 x 2 cm. 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 Patient underwent wedge resection of the left lower lobe lung nodule which showed a 1.6 cm squamous cell carcinoma of. No involvement of visceral. Margins negative. 5 lymph nodes negative for malignancy.  Interval history- Patient reports doing well. He has baseline fatigue and some exertional SOB but he is not on O2.   ECOG PS- 1 Pain scale- 0   Review of systems- Review of Systems  Constitutional: Positive for malaise/fatigue. Negative for chills, fever and weight loss.  HENT: Negative for congestion, ear discharge and nosebleeds.   Eyes: Negative for blurred vision.  Respiratory: Positive for shortness of breath. Negative for cough, hemoptysis, sputum production and wheezing.   Cardiovascular:  Negative for chest pain, palpitations, orthopnea and claudication.  Gastrointestinal: Negative for abdominal pain, blood in stool, constipation, diarrhea, heartburn, melena, nausea and vomiting.  Genitourinary: Negative for dysuria, flank pain, frequency, hematuria and urgency.  Musculoskeletal: Negative for back pain, joint pain and myalgias.  Skin: Negative for rash.  Neurological: Negative for dizziness, tingling, focal weakness, seizures, weakness and headaches.  Endo/Heme/Allergies: Does not bruise/bleed easily.  Psychiatric/Behavioral: Negative for depression and suicidal ideas. The patient does not have insomnia.        No Known Allergies   Past Medical History:  Diagnosis Date  . Alcohol abuse    pt states it is marijuana  . Anemia   . Arthritis   . Bipolar disorder ([place])   . BPH (benign prostatic hypertrophy) with urinary obstruction   . CAD (coronary artery disease)    a. s/p stent to LAD in 2006 at [facility], EF 60% at that time.  . Cancer [facility])    prostate  . CHF (congestive heart failure) ([place])   . Chronic pancreatitis ([place])   . Cirrhosis ([place])   . COPD (chronic obstructive pulmonary disease) ([place])   . Dementia ([place])    early dementia  . Depression   . Diverticulosis   . Drug use   . Dyspnea   . ED (erectile dysfunction)   . Gastritis   . GERD (gastroesophageal reflux disease)   . Headache    occasional migraines  . Hepatitis C    treated  . History of lumbar fusion   . Hyperlipidemia   . Hypertension    patient denies having high blood pressure  . Lung cancer ([place])   . Mitral regurgitation   . Pancreatitis  x 2   . Pneumonia    07/06/18   . PTSD (post-traumatic stress disorder)   . Rib fracture    s/p fall 03/15/19   . Sinus disease   . Urge incontinence      Past Surgical History:  Procedure Laterality Date  . CARDIAC CATHETERIZATION  2006  . cateract  2013  . COLONOSCOPY WITH PROPOFOL N/A 10/04/2019   Procedure: COLONOSCOPY WITH PROPOFOL;   Surgeon: Lucilla Lame, MD;  Location: [facility] ENDOSCOPY;  Service: Endoscopy;  Laterality: N/A;  . CORONARY ANGIOPLASTY  2006  . CORONARY STENT PLACEMENT  2006   x 1  . ESOPHAGOGASTRODUODENOSCOPY N/A 12/22/2014   Procedure: ESOPHAGOGASTRODUODENOSCOPY (EGD);  Surgeon: [name], MD;  Location: [facility] ENDOSCOPY;  Service: Endoscopy;  Laterality: N/A;  . ESOPHAGOGASTRODUODENOSCOPY (EGD) WITH PROPOFOL N/A 10/04/2019   Procedure: ESOPHAGOGASTRODUODENOSCOPY (EGD) WITH PROPOFOL;  Surgeon: Lucilla Lame, MD;  Location: [facility] ENDOSCOPY;  Service: Endoscopy;  Laterality: N/A;  . HERNIA REPAIR  2015   left groin  . INTERCOSTAL NERVE BLOCK Left 11/09/2019   Procedure: Intercostal Nerve Block;  Surgeon: Melrose Nakayama, MD;  Location: [place];  Service: Thoracic;  Laterality: Left;  . LUMBAR FUSION    . RADIOACTIVE SEED IMPLANT N/A 04/22/2016   Procedure: RADIOACTIVE SEED IMPLANT/BRACHYTHERAPY IMPLANT;  Surgeon: Hollice Espy, MD;  Location: [facility] ORS;  Service: Urology;  Laterality: N/A;  . ROBOT ASSISTED INGUINAL HERNIA REPAIR Bilateral 07/06/2018   Procedure: ROBOT ASSISTED INGUINAL HERNIA REPAIR;  Surgeon: Jules Husbands, MD;  Location: [facility] ORS;  Service: General;  Laterality: Bilateral;  . TONSILLECTOMY    . UMBILICAL HERNIA REPAIR N/A 07/06/2018   Procedure: LAPAROSCOPIC ROBOT ASSISTED UMBILICAL HERNIA;  Surgeon: Jules Husbands, MD;  Location: [facility] ORS;  Service: General;  Laterality: N/A;  . XI ROBOTIC ASSISTED THORACOSCOPY- SEGMENTECTOMY Left 11/09/2019   Procedure: XI ROBOTIC ASSISTED THORACOSCOPY-WEDGE RESECTION LEFT LOWER LOBE;  Surgeon: Melrose Nakayama, MD;  Location: [place];  Service: Thoracic;  Laterality: Left;    Social History   Socioeconomic History  . Marital status: Divorced    Spouse name: Not on file  . Number of children: 4  . Years of education: Not on file  . Highest education level: GED or equivalent  Occupational History  . Occupation: Aeronautical engineer     Comment: retired  Tobacco Use  . Smoking status: Current Every Day Smoker    Packs/day: 1.00    Types: Cigarettes    Start date: 07/28/1962    Last attempt to quit: 09/01/2019    Years since quitting: 0.7  . Smokeless tobacco: Never Used  Vaping Use  . Vaping Use: Never used  Substance and Sexual Activity  . Alcohol use: Yes    Alcohol/week: 1.0 standard drink    Types: 1 Cans of beer per week    Comment: states he drinks a pint a day (11/07/19)   . Drug use: Yes    Frequency: 7.0 times per week    Types: Marijuana    Comment: Endorsed heroin 06/2014, UDS also + benzos, opiates, THC, neg for cocaine at that time.  Marland Kitchen Sexual activity: Not Currently  Other Topics Concern  . Not on file  Social History Narrative   Lives at home    Has kids and grandkids   Smoker x 55 years 1 ppd as of 07/2019 he did quit x 2 years    Drinks 1 pt liq qd as of 07/2019    Social Determinants of  Health   Financial Resource Strain: Low Risk   . Difficulty of Paying Living Expenses: Not very hard  Food Insecurity: No Food Insecurity  . Worried About Charity fundraiser in the Last Year: Never true  . Ran Out of Food in the Last Year: Never true  Transportation Needs: No Transportation Needs  . Lack of Transportation (Medical): No  . Lack of Transportation (Non-Medical): No  Physical Activity:   . Days of Exercise per Week: Not on file  . Minutes of Exercise per Session: Not on file  Stress: No Stress Concern Present  . Feeling of Stress : Not at all  Social Connections:   . Frequency of Communication with Friends and Family: Not on file  . Frequency of Social Gatherings with Friends and Family: Not on file  . Attends Religious Services: Not on file  . Active Member of Clubs or Organizations: Not on file  . Attends Archivist Meetings: Not on file  . Marital Status: Not on file  Intimate Partner Violence: Not At Risk  . Fear of Current or Ex-Partner: No  . Emotionally Abused: No  .  Physically Abused: No  . Sexually Abused: No    Family History  Problem Relation Age of Onset  . Heart disease Father        Unclear details. Father passed in late 79's 2/2 cancer.  . Colon cancer Father   . Alcohol abuse Sister   . Drug abuse Sister   . Anxiety disorder Sister   . Depression Sister   . COPD Brother   . Obesity Brother   . Kidney disease Neg Hx   . Prostate cancer Neg Hx      Current Outpatient Medications:  .  albuterol (PROVENTIL) (2.5 MG/3ML) 0.083% nebulizer solution, Take 3 mLs (2.5 mg total) by nebulization every 4 (four) hours as needed for wheezing or shortness of breath., Disp: 360 mL, Rfl: 3 .  albuterol (VENTOLIN HFA) 108 (90 Base) MCG/ACT inhaler, Inhale 1-2 puffs into the lungs every 6 (six) hours as needed for wheezing or shortness of breath. , Disp: , Rfl:  .  alprazolam (XANAX) 1 MG tablet, Take 1 tablet (1 mg total) by mouth as directed. 1/2 in am 1 pill qhs, Disp: , Rfl:  .  ARIPiprazole (ABILIFY) 10 MG tablet, Take 10 mg by mouth daily., Disp: , Rfl:  .  aspirin EC 81 MG tablet, Take 81 mg by mouth daily., Disp: , Rfl:  .  budesonide-formoterol (SYMBICORT) 160-4.5 MCG/ACT inhaler, Inhale 2 puffs into the lungs 2 (two) times daily. Rinse mouth out, Disp: 1 Inhaler, Rfl: 1 .  celecoxib (CELEBREX) 200 MG capsule, Take 1 capsule (200 mg total) by mouth daily after breakfast., Disp: 90 capsule, Rfl: 0 .  clopidogrel (PLAVIX) 75 MG tablet, Take 1 tablet (75 mg total) by mouth daily. Reported on 01/15/2016, Disp: 90 tablet, Rfl: 3 .  donepezil (ARICEPT) 5 MG tablet, Take 1 tablet (5 mg total) by mouth at bedtime., Disp: 90 tablet, Rfl: 3 .  doxycycline (VIBRA-TABS) 100 MG tablet, Take 1 tablet (100 mg total) by mouth 2 (two) times daily. prn, Disp: 14 tablet, Rfl: 0 .  FLUoxetine (PROZAC) 20 MG capsule, Take 60 mg by mouth daily., Disp: , Rfl: 1 .  furosemide (LASIX) 40 MG tablet, Take 1 tablet (40 mg total) by mouth daily., Disp: 5 tablet, Rfl: 0 .   gabapentin (NEURONTIN) 600 MG tablet, , Disp: , Rfl:  .  hydrocortisone  2.5 % ointment, Apply topically 2 (two) times daily. Prn left elbow, Disp: 30 g, Rfl: 0 .  memantine (NAMENDA) 5 MG tablet, Take 1 tablet (5 mg total) by mouth 2 (two) times daily. Taking qd, Disp: 180 tablet, Rfl: 3 .  metoprolol succinate (TOPROL-XL) 25 MG 24 hr tablet, Take 1 tablet (25 mg total) by mouth daily., Disp: 90 tablet, Rfl: 3 .  mirabegron ER (MYRBETRIQ) 50 MG TB24 tablet, Take 1 tablet (50 mg total) by mouth daily., Disp: 90 tablet, Rfl: 3 .  Multiple Vitamins-Minerals (MULTIVITAMIN WITH MINERALS) tablet, Take 1 tablet by mouth daily., Disp: , Rfl:  .  mupirocin ointment (BACTROBAN) 2 %, Apply 1 application topically 2 (two) times daily. Prn, Disp: 22 g, Rfl: 0 .  pantoprazole (PROTONIX) 40 MG tablet, Take 1 tablet (40 mg total) by mouth daily. 30 min before breakfast, Disp: 90 tablet, Rfl: 3 .  potassium chloride 20 MEQ TBCR, Take 20 mEq by mouth daily., Disp: 5 tablet, Rfl: 0 .  rosuvastatin (CRESTOR) 10 MG tablet, Take 1 tablet (10 mg total) by mouth at bedtime. (Patient taking differently: Take 10 mg by mouth daily. ), Disp: 90 tablet, Rfl: 3 .  tamsulosin (FLOMAX) 0.4 MG CAPS capsule, Take 1 capsule (0.4 mg total) by mouth daily after supper. (Patient taking differently: Take 0.4 mg by mouth daily. ), Disp: 90 capsule, Rfl: 3 .  traZODone (DESYREL) 150 MG tablet, Take 1 tablet (150 mg total) by mouth at bedtime., Disp: 90 tablet, Rfl: 3 .  valACYclovir (VALTREX) 1000 MG tablet, Take 1 tablet (1,000 mg total) by mouth 3 (three) times daily. With food. Take another 1-2 weeks if better stop after 1 week and before 2 weeks, Disp: 42 tablet, Rfl: 0  Physical exam:  Vitals:   05/21/20 1106  BP: 129/78  Pulse: 72  Resp: 20  Temp: 98 F (36.7 C)  SpO2: 94%  Weight: 264 lb 14.4 oz (120.2 kg)   Physical Exam Constitutional:      General: He is not in acute distress. Cardiovascular:     Rate and Rhythm:  Normal rate and regular rhythm.     Heart sounds: Normal heart sounds.  Pulmonary:     Effort: Pulmonary effort is normal.     Breath sounds: Normal breath sounds.  Abdominal:     General: Bowel sounds are normal.     Palpations: Abdomen is soft.  Skin:    General: Skin is warm and dry.  Neurological:     Mental Status: He is alert and oriented to person, place, and time.      CMP Latest Ref Rng & Units 05/21/2020  Glucose 70 - 99 mg/dL 110(H)  BUN 8 - 23 mg/dL 16  Creatinine 0.61 - 1.24 mg/dL 1.07  Sodium 135 - 145 mmol/L 138  Potassium 3.5 - 5.1 mmol/L 4.4  Chloride 98 - 111 mmol/L 104  CO2 22 - 32 mmol/L 27  Calcium 8.9 - 10.3 mg/dL 8.4(L)  Total Protein 6.5 - 8.1 g/dL 7.3  Total Bilirubin 0.3 - 1.2 mg/dL 0.6  Alkaline Phos 38 - 126 U/L 94  AST 15 - 41 U/L 19  ALT 0 - 44 U/L 17   CBC Latest Ref Rng & Units 05/21/2020  WBC 4.0 - 10.5 K/uL 9.4  Hemoglobin 13.0 - 17.0 g/dL 12.8(L)  Hematocrit 39 - 52 % 37.8(L)  Platelets 150 - 400 K/uL 259    No images are attached to the encounter.  CT Chest W  Contrast  Result Date: 05/18/2020 CLINICAL DATA:  NON-SMALL CELL LUNG CANCER ON THE LEFT POST SURGERY IN [facility]. NO CURRENT COMPLAINTS. EXAM: CT CHEST WITH CONTRAST TECHNIQUE: Multidetector CT imaging of the chest was performed during intravenous contrast administration. CONTRAST:  53mL OMNIPAQUE IOHEXOL 300 MG/ML  SOLN COMPARISON:  CHEST CT 08/30/2019 AND PET-CT 09/07/2019. FINDINGS: Cardiovascular: Extensive coronary artery atherosclerosis with lesser involvement of the aorta and great vessels. No acute vascular findings are seen. The pulmonary arteries appear patent. The heart size is normal. There is no pericardial effusion. Mediastinum/Nodes: There are no enlarged mediastinal, hilar or axillary lymph nodes.Small mediastinal and hilar lymph nodes are grossly stable. The thyroid gland, trachea and esophagus demonstrate no significant findings. Lungs/Pleura: There are multiple  surgical clips in the left lower lobe status post interval wedge resection of the left lower lobe pulmonary nodule. There are irregular parenchymal densities surrounding the clips, measuring 2.0 x 1.6 cm on image 100/3 and 3.6 x 1.5 cm on image 118/3. There is additional focal subpleural density measuring 4.2 x 1.4 cm on image 114/3. Based on morphology, these densities likely represent postsurgical scarring and/or rounded atelectasis. There are no suspicious pulmonary nodules. Moderate centrilobular and paraseptal emphysema again noted. There is a small pleural effusion loculated at the left lung base. No pneumothorax. Upper abdomen: The visualized upper abdomen appears stable without suspicious findings. There are cysts within the upper poles of both kidneys. There is a 13 mm enhancing splenic lesion which was seen on previous MRI 08/30/2019, likely an incidental hemangioma. Musculoskeletal/Chest wall: There is no chest wall mass or suspicious osseous finding. Old rib fractures bilaterally. IMPRESSION: 1. Interval wedge resection of the left lower lobe pulmonary nodule. There are irregular parenchymal densities surrounding the clips which likely represent postsurgical scarring and/or rounded atelectasis. Attention on follow-up recommended. 2. No evidence of metastatic disease. 3. Small loculated left pleural effusion. 4. Coronary and Aortic Atherosclerosis (ICD10-I70.0) and Emphysema (ICD10-J43.9). Electronically Signed   By: Richardean Sale M.D.   On: 05/18/2020 09:01     Assessment and plan- Patient is a 74 y.o. male  with stage IApT1b pN0 cM0 squamous cell carcinoma of the right lobe status post wedge resection. He is here to discuss CT scan results  Reviewed CT chest images independently and discussed findings with the patient.  CT from 05/17/2020 does not show any evidence of recurrent or metastatic disease.  He has a small loculated left pleural effusion and he is not overtly symptomatic from it and  can be monitored conservatively.    I will see him back in 6 months with CT chest without contrast with labs CBC with differential and CMP   Visit Diagnosis 1. Squamous carcinoma of lung, left (HCC)      Dr. Randa Evens, MD, MPH [facility] at [facility] [phone] 05/23/2020 1:22 PM

## 2020-05-25 LAB — HEMOGLOBIN A1C
Hgb A1c MFr Bld: 5.6 % (ref 4.8–5.6)
Mean Plasma Glucose: 114 mg/dL

## 2020-05-25 LAB — VIRUS CULTURE

## 2020-05-29 ENCOUNTER — Telehealth: Payer: Self-pay | Admitting: Internal Medicine

## 2020-05-29 MED ORDER — PREDNISONE 50 MG PO TABS: 50.0000 mg | ORAL_TABLET | Freq: Every day | ORAL | 0 refills | Status: DC

## 2020-05-29 NOTE — Telephone Encounter (Signed)
-----   Message from [name] sent at 05/29/2020  4:01 PM EDT ----- Regarding: RE: referral Pt is scheduled at [name] on 07/04/2020 at 1 pm. Pt was also put on a cancellation list and pt was notified. ----- Message ----- From: McLean-Scocuzza, [name], MD Sent: 05/29/2020   6:15 AM EDT To: [name] Subject: RE: referral                                   Dr. Elmer Ramp in [facility] or [facility] or [facility] please  This is urgent please rash spreading and not getting better needs urgent derm consult   Thanks [place] ----- Message ----- From: [name] Sent: 05/28/2020  12:06 PM EDT To: [name], MD Subject: RE: referral                                   Good afternoon!  I called [place] [facility] they are scheduling out to the end of January.  ----- Message ----- From: McLean-Scocuzza, [name], MD Sent: 05/25/2020   4:49 PM EDT To: [name] Subject: RE: referral                                   Can you try [facility] dermatology? Off of [address]  ----- Message ----- From: [name] Sent: 05/25/2020   2:26 PM EDT To: [name], MD Subject: referral                                       Good afternoon!  Pt called to inform that [facility] derm is not taking NP at this time. I called around and the offices I called are closed. I found one and they are scheduling out to March. Pt says the rash is getting worse. Please advise and Thank you!

## 2020-05-29 NOTE — Telephone Encounter (Signed)
Patient was referred to dermatology and the soonest appointment was for the first week of December.   Per Dr Olivia Mackie McLean-Scocuzza request called the Patient to see if he would like to try an oral steroid until his dermatology appointment.   Spoke with the Patient and he would like to try this medication. Would like this sent to [place].

## 2020-05-29 NOTE — Telephone Encounter (Signed)
   Christopher Orr does pt want to try oral steroids to see if this will help the rash?   ===View-only below this line=== ----- Message ----- From: [name] Sent: 05/29/2020   4:01 PM EDT To: [name], MD Subject: RE: referral                                   Pt is scheduled at [name] on 07/04/2020 at 1 pm. Pt was also put on a cancellation list and pt was notified. ----- Message -----

## 2020-05-29 NOTE — Addendum Note (Signed)
Addended by: Orland Mustard on: 05/29/2020 06:21 PM   Modules accepted: Orders

## 2020-05-30 ENCOUNTER — Other Ambulatory Visit: Payer: Self-pay

## 2020-05-30 ENCOUNTER — Encounter: Payer: Self-pay | Admitting: Radiation Oncology

## 2020-05-30 ENCOUNTER — Ambulatory Visit
Admission: RE | Admit: 2020-05-30 | Discharge: 2020-05-30 | Disposition: A | Discharge status: Home / Self Care | Payer: Medicare HMO | Source: Ambulatory Visit | Attending: Radiation Oncology | Admitting: Radiation Oncology

## 2020-05-30 VITALS — BP 133/75 | HR 66 | Temp 96.9°F | Wt 263.0 lb

## 2020-05-30 DIAGNOSIS — L2389 Allergic contact dermatitis due to other agents: Secondary | ICD-10-CM | POA: Diagnosis not present

## 2020-05-30 DIAGNOSIS — Z923 Personal history of irradiation: Secondary | ICD-10-CM | POA: Insufficient documentation

## 2020-05-30 DIAGNOSIS — C61 Malignant neoplasm of prostate: Secondary | ICD-10-CM | POA: Diagnosis not present

## 2020-05-30 NOTE — Progress Notes (Signed)
Radiation Oncology Follow up Note  Name: Christopher Orr   Date:   05/30/2020 MRN:  [id number] DOB: Sep 07, 1945    This 74 y.o. male presents to the clinic today for 4-year follow-up status post I-125 interstitial implant for Gleason 6 (3+3) adenocarcinoma the prostate.  REFERRING PROVIDER: Jodelle Green, FNP  HPI: Patient is a 74 year old male now out for years status post I-125 interstitial implant for Gleason 6 (3+3) adenocarcinoma the prostate.  He is doing well very low side effect profile he does have nocturia x2-3 no significant urgency or frequency.  He has no bowel complaints.  His most recent PSA continues to show excellent response biochemically which is less than 0.01.  Patient is status post wedge resection for a stage I squamous cell carcinoma of the left lower lobe.  Most recent CT scan shows wedge resection left lower lobe with some irregular parenchymal density surrounding the clips most likely representing surgical scarring although attention on follow-up is recommended no evidence of metastatic disease.  Those were films were reviewed.  COMPLICATIONS OF TREATMENT: none  FOLLOW UP COMPLIANCE: keeps appointments   PHYSICAL EXAM:  BP 133/75   Pulse 66   Temp (!) 96.9 F (36.1 C) (Tympanic)   Wt 263 lb (119.3 kg)   SpO2 97% Comment: room air  BMI 35.67 kg/m  Well-developed well-nourished patient in NAD. HEENT reveals PERLA, EOMI, discs not visualized.  Oral cavity is clear. No oral mucosal lesions are identified. Neck is clear without evidence of cervical or supraclavicular adenopathy. Lungs are clear to A&P. Cardiac examination is essentially unremarkable with regular rate and rhythm without murmur rub or thrill. Abdomen is benign with no organomegaly or masses noted. Motor sensory and DTR levels are equal and symmetric in the upper and lower extremities. Cranial nerves II through XII are grossly intact. Proprioception is intact. No peripheral adenopathy or edema is  identified. No motor or sensory levels are noted. Crude visual fields are within normal range.  RADIOLOGY RESULTS: CT scan of the chest reviewed compatible with above-stated findings  PLAN: Present time patient is under excellent biochemical control of his prostate cancer.  Patient also CT scan shows no evidence of progressive disease in his chest.  And pleased with his overall progress.  I will see him back in 1 year for follow-up for his prostate cancer then discontinue follow-up care.  Patient knows to call with any concerns.  I would like to take this opportunity to thank you for allowing me to participate in the care of your patient.Noreene Filbert, MD

## 2020-05-30 NOTE — Telephone Encounter (Signed)
Please see previous 11/02 telephone encounter. Patient agreeable to medication and Dr Olivia Mackie McLean-Scocuzza informed.

## 2020-06-28 ENCOUNTER — Ambulatory Visit (INDEPENDENT_AMBULATORY_CARE_PROVIDER_SITE_OTHER): Payer: Medicare HMO | Admitting: Internal Medicine

## 2020-06-28 ENCOUNTER — Other Ambulatory Visit: Payer: Self-pay

## 2020-06-28 ENCOUNTER — Encounter: Payer: Self-pay | Admitting: Internal Medicine

## 2020-06-28 VITALS — BP 124/76 | HR 86 | Temp 98.2°F | Ht 72.0 in | Wt 266.2 lb

## 2020-06-28 DIAGNOSIS — R32 Unspecified urinary incontinence: Secondary | ICD-10-CM

## 2020-06-28 DIAGNOSIS — R5383 Other fatigue: Secondary | ICD-10-CM

## 2020-06-28 DIAGNOSIS — C3490 Malignant neoplasm of unspecified part of unspecified bronchus or lung: Secondary | ICD-10-CM

## 2020-06-28 DIAGNOSIS — N529 Male erectile dysfunction, unspecified: Secondary | ICD-10-CM | POA: Diagnosis not present

## 2020-06-28 DIAGNOSIS — L239 Allergic contact dermatitis, unspecified cause: Secondary | ICD-10-CM | POA: Diagnosis not present

## 2020-06-28 DIAGNOSIS — B356 Tinea cruris: Secondary | ICD-10-CM | POA: Diagnosis not present

## 2020-06-28 DIAGNOSIS — E291 Testicular hypofunction: Secondary | ICD-10-CM

## 2020-06-28 MED ORDER — MICONAZOLE NITRATE 2 % EX CREA: 1.0000 "application " | TOPICAL_CREAM | Freq: Two times a day (BID) | CUTANEOUS | 11 refills | Status: DC

## 2020-06-28 NOTE — Patient Instructions (Signed)
Gold bond talc free powder    Jock Itch Jock itch (tinea cruris) is an infection of the skin in the groin area. It is caused by a fungus, which is a type of germ that lives in dark, damp places. Jock itch causes an itchy rash in the groin and upper thigh area. It usually goes away in 2-3 weeks with treatment. What are the causes? The fungus that causes jock itch may be spread by:  Touching a fungus infection elsewhere on your body, such as athlete's foot, and then touching your groin area.  Sharing towels or clothing, such as socks or shoes, with someone who has a fungal infection. What increases the risk? Jock itch is most common in men and adolescent boys. You are also more likely to develop the condition if you:  Are in a hot, humid climate.  Wear tight-fitting clothing or wet bathing suits for long periods of time.  Play sports.  Are overweight.  Have diabetes.  Have a weakened immune system.  Sweat a lot. What are the signs or symptoms? Symptoms of jock itch may include:  A red, pink, or brown rash in the groin area. Blisters may be present. The rash may spread to the thighs, anus, and buttocks.  Dry and scaly skin on or around the rash.  Itchiness. How is this diagnosed? In most cases, your health care provider can make the diagnosis by looking at your rash. In some cases, a sample of infected skin may be scraped off. This sample may be examined under a microscope (biopsy) or by trying to grow the fungus from the sample (culture). How is this treated? Treatment for this condition may include:  Antifungal medicine to kill the fungus. This may be a skin cream, ointment, or powder, or it may be a medicine that you take by mouth.  Skin cream or ointment to reduce itching.  Lifestyle changes, such as wearing looser clothing and caring for your skin. Follow these instructions at home: Skin care  Apply skin creams, ointments, or powders exactly as told by your health  care provider.  Wear loose-fitting clothing that does not rub against your groin area. Men should wear boxer shorts or loose-fitting underwear.  Keep your groin area clean and dry. ? Change your underwear every day. ? Change out of wet bathing suits as soon as possible. ? After bathing, use a separate towel to dry your groin area thoroughly and gently. Using a separate towel will help prevent spreading the infection to other areas of your body.  Avoid hot baths and showers. Hot water can make itching worse.  Do not scratch the affected area. General instructions  Take and apply over-the-counter and prescription medicines only as told by your health care provider.  Do not share towels, clothing, or personal items with other people.  Wash your hands often with soap and water, especially after touching your groin area. If soap and water are not available, use alcohol-based hand sanitizer. Contact a health care provider if:  Your rash: ? Gets worse or does not get better after 2 weeks of treatment. ? Spreads. ? Returns after treatment is finished.  You have any of the following: ? A fever. ? New or worsening redness, swelling, or pain around your rash. ? Fluid, blood, or pus coming from your rash. Summary  Jock itch (tinea cruris) is a fungal infection of the skin in the groin area.  The fungus can be spread by sharing clothing or by touching  a fungus infection elsewhere on your body and then touching your groin area.  Treatment may include antifungal medicine and lifestyle changes, such as keeping the area clean and dry. This information is not intended to replace advice given to you by your health care provider. Make sure you discuss any questions you have with your health care provider. Document Revised: 06/26/2017 Document Reviewed: 06/24/2017 Elsevier Patient Education  2020 [employer].

## 2020-06-28 NOTE — Progress Notes (Signed)
Chief Complaint  Patient presents with  . Follow-up   F/u  1. Rash to forearms resolved with topical clobetasol and prednisone taper from 60 to 10 over 6 days saw Dr. Elmer Ramp who though was allergic dermatitis and it is resolved.  He c/o jock it now and will Rx topical and he can also use otc hc 2. Fatigue energy low though sleeping 8-10 hrs will check testosterone he has a lot of stressors I.e 1 son homeless 2nd son on drugs  Advised pt also to disc mood with psych upcoming appt  He is not sure if snoring but for now declines sleep study 3. He c/o urinary incontinence f/u d.r Brandon 5/22 will see if can work in [employer]  4. H/o lung cancer still smoking though less and repeat CT chest 11/16/20 10:30 am  Review of Systems  Constitutional: Positive for malaise/fatigue. Negative for weight loss.  HENT: Negative for hearing loss.   Eyes: Negative for blurred vision.  Respiratory: Negative for shortness of breath.   Cardiovascular: Negative for chest pain.  Genitourinary:       +incontinence    Musculoskeletal: Negative for falls and joint pain.  Skin: Negative for rash.  Neurological: Negative for headaches.  Psychiatric/Behavioral: Negative for depression.   Past Medical History:  Diagnosis Date  . Alcohol abuse    pt states it is marijuana  . Anemia   . Arthritis   . Bipolar disorder ([place])   . BPH (benign prostatic hypertrophy) with urinary obstruction   . CAD (coronary artery disease)    a. s/p stent to LAD in 2006 at [facility], EF 60% at that time.  . Cancer [facility])    prostate  . CHF (congestive heart failure) ([place])   . Chronic pancreatitis ([place])   . Cirrhosis ([place])   . COPD (chronic obstructive pulmonary disease) ([place])   . Dementia ([place])    early dementia  . Depression   . Diverticulosis   . Drug use   . Dyspnea   . ED (erectile dysfunction)   . Gastritis   . GERD (gastroesophageal reflux disease)   . Headache    occasional migraines  . Hepatitis C    treated  .  History of lumbar fusion   . Hyperlipidemia   . Hypertension    patient denies having high blood pressure  . Lung cancer ([place])   . Mitral regurgitation   . Pancreatitis    x 2   . Pneumonia    07/06/18   . PTSD (post-traumatic stress disorder)   . Rib fracture    s/p fall 03/15/19   . Sinus disease   . Urge incontinence    Past Surgical History:  Procedure Laterality Date  . CARDIAC CATHETERIZATION  2006  . cateract  2013  . COLONOSCOPY WITH PROPOFOL N/A 10/04/2019   Procedure: COLONOSCOPY WITH PROPOFOL;  Surgeon: Lucilla Lame, MD;  Location: [facility] ENDOSCOPY;  Service: Endoscopy;  Laterality: N/A;  . CORONARY ANGIOPLASTY  2006  . CORONARY STENT PLACEMENT  2006   x 1  . ESOPHAGOGASTRODUODENOSCOPY N/A 12/22/2014   Procedure: ESOPHAGOGASTRODUODENOSCOPY (EGD);  Surgeon: [name], MD;  Location: [facility] ENDOSCOPY;  Service: Endoscopy;  Laterality: N/A;  . ESOPHAGOGASTRODUODENOSCOPY (EGD) WITH PROPOFOL N/A 10/04/2019   Procedure: ESOPHAGOGASTRODUODENOSCOPY (EGD) WITH PROPOFOL;  Surgeon: Lucilla Lame, MD;  Location: [facility] ENDOSCOPY;  Service: Endoscopy;  Laterality: N/A;  . HERNIA REPAIR  2015   left groin  . INTERCOSTAL NERVE BLOCK Left 11/09/2019   Procedure: Intercostal  Nerve Block;  Surgeon: Melrose Nakayama, MD;  Location: [place];  Service: Thoracic;  Laterality: Left;  . LUMBAR FUSION    . RADIOACTIVE SEED IMPLANT N/A 04/22/2016   Procedure: RADIOACTIVE SEED IMPLANT/BRACHYTHERAPY IMPLANT;  Surgeon: Hollice Espy, MD;  Location: [facility] ORS;  Service: Urology;  Laterality: N/A;  . ROBOT ASSISTED INGUINAL HERNIA REPAIR Bilateral 07/06/2018   Procedure: ROBOT ASSISTED INGUINAL HERNIA REPAIR;  Surgeon: Jules Husbands, MD;  Location: [facility] ORS;  Service: General;  Laterality: Bilateral;  . TONSILLECTOMY    . UMBILICAL HERNIA REPAIR N/A 07/06/2018   Procedure: LAPAROSCOPIC ROBOT ASSISTED UMBILICAL HERNIA;  Surgeon: Jules Husbands, MD;  Location: [facility] ORS;  Service: General;   Laterality: N/A;  . XI ROBOTIC ASSISTED THORACOSCOPY- SEGMENTECTOMY Left 11/09/2019   Procedure: XI ROBOTIC ASSISTED THORACOSCOPY-WEDGE RESECTION LEFT LOWER LOBE;  Surgeon: Melrose Nakayama, MD;  Location[facility] OR;  Service: Thoracic;  Laterality: Left;   Family History  Problem Relation Age of Onset  . Heart disease Father        Unclear details. Father passed in late 59's 2/2 cancer.  . Colon cancer Father   . Alcohol abuse Sister   . Drug abuse Sister   . Anxiety disorder Sister   . Depression Sister   . COPD Brother   . Obesity Brother   . Kidney disease Neg Hx   . Prostate cancer Neg Hx    Social History   Socioeconomic History  . Marital status: Divorced    Spouse name: Not on file  . Number of children: 4  . Years of education: Not on file  . Highest education level: GED or equivalent  Occupational History  . Occupation: Aeronautical engineer    Comment: retired  Tobacco Use  . Smoking status: Current Every Day Smoker    Packs/day: 1.00    Types: Cigarettes    Start date: 07/28/1962    Last attempt to quit: 09/01/2019    Years since quitting: 0.8  . Smokeless tobacco: Never Used  Vaping Use  . Vaping Use: Never used  Substance and Sexual Activity  . Alcohol use: Yes    Alcohol/week: 1.0 standard drink    Types: 1 Cans of beer per week    Comment: states he drinks a pint a day (11/07/19)   . Drug use: Yes    Frequency: 7.0 times per week    Types: Marijuana    Comment: Endorsed heroin 06/2014, UDS also + benzos, opiates, THC, neg for cocaine at that time.  Marland Kitchen Sexual activity: Not Currently  Other Topics Concern  . Not on file  Social History Narrative   Lives at home    Has kids and grandkids   Smoker x 55 years 1 ppd as of 07/2019 he did quit x 2 years    Drinks 1 pt liq qd as of 07/2019    Social Determinants of Health   Financial Resource Strain: Low Risk   . Difficulty of Paying Living Expenses: Not very hard  Food Insecurity: No Food Insecurity  .  Worried About Charity fundraiser in the Last Year: Never true  . Ran Out of Food in the Last Year: Never true  Transportation Needs: No Transportation Needs  . Lack of Transportation (Medical): No  . Lack of Transportation (Non-Medical): No  Physical Activity:   . Days of Exercise per Week: Not on file  . Minutes of Exercise per Session: Not on file  Stress: No Stress Concern  Present  . Feeling of Stress : Not at all  Social Connections:   . Frequency of Communication with Friends and Family: Not on file  . Frequency of Social Gatherings with Friends and Family: Not on file  . Attends Religious Services: Not on file  . Active Member of Clubs or Organizations: Not on file  . Attends Archivist Meetings: Not on file  . Marital Status: Not on file  Intimate Partner Violence: Not At Risk  . Fear of Current or Ex-Partner: No  . Emotionally Abused: No  . Physically Abused: No  . Sexually Abused: No   Current Meds  Medication Sig  . albuterol (PROVENTIL) (2.5 MG/3ML) 0.083% nebulizer solution Take 3 mLs (2.5 mg total) by nebulization every 4 (four) hours as needed for wheezing or shortness of breath.  Marland Kitchen albuterol (VENTOLIN HFA) 108 (90 Base) MCG/ACT inhaler Inhale 1-2 puffs into the lungs every 6 (six) hours as needed for wheezing or shortness of breath.   . alprazolam (XANAX) 1 MG tablet Take 1 tablet (1 mg total) by mouth as directed. 1/2 in am 1 pill qhs  . ARIPiprazole (ABILIFY) 10 MG tablet Take 10 mg by mouth daily.  Marland Kitchen aspirin EC 81 MG tablet Take 81 mg by mouth daily.  . budesonide-formoterol (SYMBICORT) 160-4.5 MCG/ACT inhaler Inhale 2 puffs into the lungs 2 (two) times daily. Rinse mouth out  . celecoxib (CELEBREX) 200 MG capsule Take 1 capsule (200 mg total) by mouth daily after breakfast.  . clopidogrel (PLAVIX) 75 MG tablet Take 1 tablet (75 mg total) by mouth daily. Reported on 01/15/2016  . donepezil (ARICEPT) 5 MG tablet Take 1 tablet (5 mg total) by mouth at  bedtime.  Marland Kitchen FLUoxetine (PROZAC) 20 MG capsule Take 60 mg by mouth daily.  . furosemide (LASIX) 40 MG tablet Take 1 tablet (40 mg total) by mouth daily.  Marland Kitchen gabapentin (NEURONTIN) 600 MG tablet   . memantine (NAMENDA) 5 MG tablet Take 1 tablet (5 mg total) by mouth 2 (two) times daily. Taking qd  . metoprolol succinate (TOPROL-XL) 25 MG 24 hr tablet Take 1 tablet (25 mg total) by mouth daily.  . mirabegron ER (MYRBETRIQ) 50 MG TB24 tablet Take 1 tablet (50 mg total) by mouth daily.  . Multiple Vitamins-Minerals (MULTIVITAMIN WITH MINERALS) tablet Take 1 tablet by mouth daily.  . pantoprazole (PROTONIX) 40 MG tablet Take 1 tablet (40 mg total) by mouth daily. 30 min before breakfast  . potassium chloride 20 MEQ TBCR Take 20 mEq by mouth daily.  . predniSONE (DELTASONE) 50 MG tablet Take 1 tablet (50 mg total) by mouth daily with breakfast. X 7-10 days with food  . rosuvastatin (CRESTOR) 10 MG tablet Take 1 tablet (10 mg total) by mouth at bedtime. (Patient taking differently: Take 10 mg by mouth daily. )  . tamsulosin (FLOMAX) 0.4 MG CAPS capsule Take 1 capsule (0.4 mg total) by mouth daily after supper. (Patient taking differently: Take 0.4 mg by mouth daily. )  . traZODone (DESYREL) 150 MG tablet Take 1 tablet (150 mg total) by mouth at bedtime.  . valACYclovir (VALTREX) 1000 MG tablet Take 1 tablet (1,000 mg total) by mouth 3 (three) times daily. With food. Take another 1-2 weeks if better stop after 1 week and before 2 weeks  . [DISCONTINUED] doxycycline (VIBRA-TABS) 100 MG tablet Take 1 tablet (100 mg total) by mouth 2 (two) times daily. prn   No Known Allergies Recent Results (from the past 2160 hour(s))  Virus culture     Status: None   Collection Time: 05/15/20  2:00 PM   Specimen: Skin   Skin  Result Value Ref Range   Viral Culture: CANCELED     Comment: Test not performed. Type of swab received not suitable for testing requested. eswab is not acceptable for test, need utm or vtm  tp  Result canceled by the ancillary.   Virus culture     Status: None   Collection Time: 05/15/20  2:00 PM   Skin  Result Value Ref Range   Viral Culture: No virus isolated.   I-STAT creatinine     Status: None   Collection Time: 05/17/20 11:14 AM  Result Value Ref Range   Creatinine, Ser 1.10 0.61 - 1.24 mg/dL  Hemoglobin A1c     Status: None   Collection Time: 05/21/20 10:51 AM  Result Value Ref Range   Hgb A1c MFr Bld 5.6 4.8 - 5.6 %    Comment: (NOTE)         Prediabetes: 5.7 - 6.4         Diabetes: >6.4         Glycemic control for adults with diabetes: <7.0    Mean Plasma Glucose 114 mg/dL    Comment: (NOTE) Performed At: [facility] [place], [place] [id number] Rush Farmer MD HC:[id number]   PSA     Status: None   Collection Time: 05/21/20 10:51 AM  Result Value Ref Range   Prostatic Specific Antigen <0.01 0.00 - 4.00 ng/mL    Comment: (NOTE) While PSA levels of <=4.0 ng/ml are reported as reference range, some men with levels below 4.0 ng/ml can have prostate cancer and many men with PSA above 4.0 ng/ml do not have prostate cancer.  Other tests such as free PSA, age specific reference ranges, PSA velocity and PSA doubling time may be helpful especially in men less than 48 years old. Performed at [facility], [place] [address] [place], [place] [address]   Lipid panel     Status: None   Collection Time: 05/21/20 10:51 AM  Result Value Ref Range   Cholesterol 111 0 - 200 mg/dL   Triglycerides 77 <150 mg/dL   HDL 49 >40 mg/dL   Total CHOL/HDL Ratio 2.3 RATIO   VLDL 15 0 - 40 mg/dL   LDL Cholesterol 47 0 - 99 mg/dL    Comment:        Total Cholesterol/HDL:CHD Risk Coronary Heart Disease Risk Table                     Men   Women  1/2 Average Risk   3.4   3.3  Average Risk       5.0   4.4  2 X Average Risk   9.6   7.1  3 X Average Risk  23.4   11.0        Use the calculated Patient Ratio above and the CHD Risk Table to  determine the patient's CHD Risk.        ATP III CLASSIFICATION (LDL):  <100     mg/dL   Optimal  100-129  mg/dL   Near or Above                    Optimal  130-159  mg/dL   Borderline  160-189  mg/dL   High  >190     mg/dL   Very High Performed  at [facility], [address] [place], [place] [address]   Comprehensive metabolic panel     Status: Abnormal   Collection Time: 05/21/20 10:51 AM  Result Value Ref Range   Sodium 138 135 - 145 mmol/L   Potassium 4.4 3.5 - 5.1 mmol/L   Chloride 104 98 - 111 mmol/L   CO2 27 22 - 32 mmol/L   Glucose, Bld 110 (H) 70 - 99 mg/dL    Comment: Glucose reference range applies only to samples taken after fasting for at least 8 hours.   BUN 16 8 - 23 mg/dL   Creatinine, Ser 1.07 0.61 - 1.24 mg/dL   Calcium 8.4 (L) 8.9 - 10.3 mg/dL   Total Protein 7.3 6.5 - 8.1 g/dL   Albumin 3.8 3.5 - 5.0 g/dL   AST 19 15 - 41 U/L   ALT 17 0 - 44 U/L   Alkaline Phosphatase 94 38 - 126 U/L   Total Bilirubin 0.6 0.3 - 1.2 mg/dL   GFR, Estimated >60 >60 mL/min    Comment: (NOTE) Calculated using the CKD-EPI Creatinine Equation (2021)    Anion gap 7 5 - 15    Comment: Performed at [facility], [address] [place], [place] [address]  CBC with Differential     Status: Abnormal   Collection Time: 05/21/20 10:51 AM  Result Value Ref Range   WBC 9.4 4.0 - 10.5 K/uL   RBC 3.86 (L) 4.22 - 5.81 MIL/uL   Hemoglobin 12.8 (L) 13.0 - 17.0 g/dL   HCT 37.8 (L) 39 - 52 %   MCV 97.9 80.0 - 100.0 fL   MCH 33.2 26.0 - 34.0 pg   MCHC 33.9 30.0 - 36.0 g/dL   RDW 15.0 11.5 - 15.5 %   Platelets 259 150 - 400 K/uL   nRBC 0.0 0.0 - 0.2 %   Neutrophils Relative % 68 %   Neutro Abs 6.4 1.7 - 7.7 K/uL   Lymphocytes Relative 21 %   Lymphs Abs 1.9 0.7 - 4.0 K/uL   Monocytes Relative 7 %   Monocytes Absolute 0.7 0.1 - 1.0 K/uL   Eosinophils Relative 3 %   Eosinophils Absolute 0.3 0.0 - 0.5 K/uL   Basophils Relative 1 %   Basophils Absolute 0.1 0.0 - 0.1  K/uL   Immature Granulocytes 0 %   Abs Immature Granulocytes 0.02 0.00 - 0.07 K/uL    Comment: Performed at [facility], [address] [place], [place] [address]  PSA     Status: None   Collection Time: 05/23/20 10:38 AM  Result Value Ref Range   Prostatic Specific Antigen <0.01 0.00 - 4.00 ng/mL    Comment: Performed at [facility], [place] [address] [place], [place] [address]   Objective  Body mass index is 36.1 kg/m. Wt Readings from Last 3 Encounters:  06/28/20 266 lb 3.2 oz (120.7 kg)  05/30/20 263 lb (119.3 kg)  05/21/20 264 lb 14.4 oz (120.2 kg)   Temp Readings from Last 3 Encounters:  06/28/20 98.2 F (36.8 C) (Oral)  05/30/20 (!) 96.9 F (36.1 C) (Tympanic)  05/21/20 98 F (36.7 C)   BP Readings from Last 3 Encounters:  06/28/20 124/76  05/30/20 133/75  05/21/20 129/78   Pulse Readings from Last 3 Encounters:  06/28/20 86  05/30/20 66  05/21/20 72    Physical Exam Vitals and nursing note reviewed.  Constitutional:      Appearance: Normal appearance. He is  well-developed and well-groomed. He is obese.  HENT:     Head: Normocephalic and atraumatic.  Eyes:     Conjunctiva/sclera: Conjunctivae normal.     Pupils: Pupils are equal, round, and reactive to light.  Cardiovascular:     Rate and Rhythm: Normal rate and regular rhythm.     Heart sounds: Normal heart sounds. No murmur heard.   Pulmonary:     Effort: Pulmonary effort is normal.     Breath sounds: Normal breath sounds.  Skin:    General: Skin is warm and dry.  Neurological:     General: No focal deficit present.     Mental Status: He is alert and oriented to person, place, and time. Mental status is at baseline.     Gait: Gait normal.  Psychiatric:        Attention and Perception: Attention and perception normal.        Mood and Affect: Mood and affect normal.        Speech: Speech normal.        Behavior: Behavior normal. Behavior is cooperative.        Thought Content:  Thought content normal.        Cognition and Memory: Cognition and memory normal.        Judgment: Judgment normal.     Assessment  Plan  Allergic dermatitis Resolved if returns oral prednisone and topical clobetasol helps   Urinary incontinence, unspecified type Erectile dysfunction, unspecified erectile dysfunction type - Plan: Testosterone Total,Free,Bio, Males-([facility]) Fu Dr. Erlene Quan messaged to see if she can see before 11/2020    Fatigue, unspecified type - Plan: Testosterone Total,Free,Bio, Males-([facility]) Disc with psych  Declines sleep study for now   Jock itch - Plan: miconazole (MICATIN) 2 % cream Consider oral diflucan if does not help  Consider clotrimazole if micatin too $   Malignant neoplasm of lung, unspecified laterality, unspecified part of lung ([place])  rec smoking cessation  CT chest 11/16/20 and h/o h/o   HM Flu shot utd  prevnar utd/pna 23due in 1 year9/18/21 Tdapconsider future shingrixconsider future covid vx per pt had 3/3 pfizer  COPD + Smoker 1 ppd x 55 years as of 07/2019 and had quit x 2 years in the past  CT chest sch 05/17/20 s/p LLL resection 11/09/19 and CT chest repeat 11/16/20   H/o hep C tx'ed Harvoni last 11/13/15 not detected quant will recheck with hep A immune 09/21/14/hep B >1000 09/21/14 and sAg negative  Alcohol abuse drinks 1 pt liquor qd   H/o prostate cancers/p brachytx with seedsPSA 05/18/19 0.22 f/u Dr.Brandon urology 10/2019 and Dr. Massie Maroon rad/onc PSA ordered with A1C, lipid for labs h/o 05/21/20 F/u urology 5/222   Skin-normal exam except for bruising and reason for visit  est Dr. Phillip Heal saw 05/2020   Colonoscopy3/9/21 tubular and hyperplastic Dr. Allen Norris f/u in 5 years 12/22/14 consider in future vs cologuard if qualifies   Neurology Dr. Manuella Ghazi Cards Dr. Fletcher Anon  [facility] psych   Provider: Dr. Olivia Mackie McLean-Scocuzza-Internal Medicine

## 2020-07-02 ENCOUNTER — Other Ambulatory Visit (INDEPENDENT_AMBULATORY_CARE_PROVIDER_SITE_OTHER): Payer: Medicare HMO

## 2020-07-02 ENCOUNTER — Other Ambulatory Visit: Payer: Self-pay

## 2020-07-02 DIAGNOSIS — N529 Male erectile dysfunction, unspecified: Secondary | ICD-10-CM | POA: Diagnosis not present

## 2020-07-02 DIAGNOSIS — R5383 Other fatigue: Secondary | ICD-10-CM

## 2020-07-02 DIAGNOSIS — E291 Testicular hypofunction: Secondary | ICD-10-CM

## 2020-07-03 LAB — TESTOSTERONE TOTAL,FREE,BIO, MALES
Albumin: 3.8 g/dL (ref 3.6–5.1)
Sex Hormone Binding: 34 nmol/L (ref 22–77)
Testosterone: 134 ng/dL — ABNORMAL LOW (ref 250–827)

## 2020-07-10 DIAGNOSIS — F341 Dysthymic disorder: Secondary | ICD-10-CM | POA: Diagnosis not present

## 2020-07-10 DIAGNOSIS — F331 Major depressive disorder, recurrent, moderate: Secondary | ICD-10-CM | POA: Diagnosis not present

## 2020-07-10 DIAGNOSIS — F4310 Post-traumatic stress disorder, unspecified: Secondary | ICD-10-CM | POA: Diagnosis not present

## 2020-07-10 DIAGNOSIS — F411 Generalized anxiety disorder: Secondary | ICD-10-CM | POA: Diagnosis not present

## 2020-07-11 ENCOUNTER — Other Ambulatory Visit: Payer: Self-pay

## 2020-07-11 DIAGNOSIS — E785 Hyperlipidemia, unspecified: Secondary | ICD-10-CM

## 2020-07-11 MED ORDER — ROSUVASTATIN CALCIUM 10 MG PO TABS: 10.0000 mg | ORAL_TABLET | Freq: Every day | ORAL | 3 refills | Status: DC

## 2020-08-04 ENCOUNTER — Other Ambulatory Visit: Payer: Self-pay | Admitting: *Deleted

## 2020-08-04 DIAGNOSIS — M159 Polyosteoarthritis, unspecified: Secondary | ICD-10-CM

## 2020-08-04 MED ORDER — CELECOXIB 200 MG PO CAPS: 200.0000 mg | ORAL_CAPSULE | Freq: Every day | ORAL | 0 refills | Status: DC

## 2020-08-21 ENCOUNTER — Other Ambulatory Visit: Payer: Self-pay

## 2020-08-21 ENCOUNTER — Encounter: Payer: Self-pay | Admitting: Internal Medicine

## 2020-08-21 ENCOUNTER — Telehealth (INDEPENDENT_AMBULATORY_CARE_PROVIDER_SITE_OTHER): Payer: Medicare HMO | Admitting: Internal Medicine

## 2020-08-21 VITALS — Ht 72.0 in | Wt 260.0 lb

## 2020-08-21 DIAGNOSIS — R0602 Shortness of breath: Secondary | ICD-10-CM

## 2020-08-21 DIAGNOSIS — R112 Nausea with vomiting, unspecified: Secondary | ICD-10-CM

## 2020-08-21 DIAGNOSIS — E860 Dehydration: Secondary | ICD-10-CM

## 2020-08-21 DIAGNOSIS — U071 COVID-19: Secondary | ICD-10-CM | POA: Diagnosis not present

## 2020-08-21 MED ORDER — ONDANSETRON HCL 4 MG PO TABS: 4.0000 mg | ORAL_TABLET | Freq: Three times a day (TID) | ORAL | 0 refills | Status: DC | PRN

## 2020-08-21 MED ORDER — ONDANSETRON 4 MG PO TBDP: 4.0000 mg | ORAL_TABLET | Freq: Three times a day (TID) | ORAL | 0 refills | Status: DC | PRN

## 2020-08-21 NOTE — Progress Notes (Signed)
Telephone Note  I connected with Christopher Orr  on 08/21/20 at 11:50 AM EST by telephone and verified that I am speaking with the correct person using two identifiers.  Location patient: home, [place] Location provider:work or home office Persons participating in the virtual visit: patient, provider Called daughter [name]   I discussed the limitations of evaluation and management by telemedicine and the availability of in person appointments. The patient expressed understanding and agreed to proceed.   HPI: 1. N/v/headache, cough, congestion, sob tested +08/18/20 covid 19+, appetite down x 3 days, oatmeal was able to keep down feels mild sob   ROS: See pertinent positives and negatives per HPI.  Past Medical History:  Diagnosis Date  . Alcohol abuse    pt states it is marijuana  . Anemia   . Arthritis   . Bipolar disorder ([place])   . BPH (benign prostatic hypertrophy) with urinary obstruction   . CAD (coronary artery disease)    a. s/p stent to LAD in 2006 at [facility], EF 60% at that time.  . Cancer [facility])    prostate  . CHF (congestive heart failure) ([place])   . Chronic pancreatitis ([place])   . Cirrhosis ([place])   . COPD (chronic obstructive pulmonary disease) ([place])   . Dementia ([place])    early dementia  . Depression   . Diverticulosis   . Drug use   . Dyspnea   . ED (erectile dysfunction)   . Gastritis   . GERD (gastroesophageal reflux disease)   . Headache    occasional migraines  . Hepatitis C    treated  . History of lumbar fusion   . Hyperlipidemia   . Hypertension    patient denies having high blood pressure  . Lung cancer ([place])   . Mitral regurgitation   . Pancreatitis    x 2   . Pneumonia    07/06/18   . PTSD (post-traumatic stress disorder)   . Rib fracture    s/p fall 03/15/19   . Sinus disease   . Urge incontinence     Past Surgical History:  Procedure Laterality Date  . CARDIAC CATHETERIZATION  2006  . cateract  2013  . COLONOSCOPY WITH PROPOFOL N/A 10/04/2019    Procedure: COLONOSCOPY WITH PROPOFOL;  Surgeon: Lucilla Lame, MD;  Location: [facility] ENDOSCOPY;  Service: Endoscopy;  Laterality: N/A;  . CORONARY ANGIOPLASTY  2006  . CORONARY STENT PLACEMENT  2006   x 1  . ESOPHAGOGASTRODUODENOSCOPY N/A 12/22/2014   Procedure: ESOPHAGOGASTRODUODENOSCOPY (EGD);  Surgeon: [name], MD;  Location: [facility] ENDOSCOPY;  Service: Endoscopy;  Laterality: N/A;  . ESOPHAGOGASTRODUODENOSCOPY (EGD) WITH PROPOFOL N/A 10/04/2019   Procedure: ESOPHAGOGASTRODUODENOSCOPY (EGD) WITH PROPOFOL;  Surgeon: Lucilla Lame, MD;  Location: [facility] ENDOSCOPY;  Service: Endoscopy;  Laterality: N/A;  . HERNIA REPAIR  2015   left groin  . INTERCOSTAL NERVE BLOCK Left 11/09/2019   Procedure: Intercostal Nerve Block;  Surgeon: Melrose Nakayama, MD;  Location: [place];  Service: Thoracic;  Laterality: Left;  . LUMBAR FUSION    . RADIOACTIVE SEED IMPLANT N/A 04/22/2016   Procedure: RADIOACTIVE SEED IMPLANT/BRACHYTHERAPY IMPLANT;  Surgeon: Hollice Espy, MD;  Location: [facility] ORS;  Service: Urology;  Laterality: N/A;  . ROBOT ASSISTED INGUINAL HERNIA REPAIR Bilateral 07/06/2018   Procedure: ROBOT ASSISTED INGUINAL HERNIA REPAIR;  Surgeon: Jules Husbands, MD;  Location: [facility] ORS;  Service: General;  Laterality: Bilateral;  . TONSILLECTOMY    . UMBILICAL HERNIA REPAIR N/A 07/06/2018   Procedure: LAPAROSCOPIC  ROBOT ASSISTED UMBILICAL HERNIA;  Surgeon: Jules Husbands, MD;  Location: [facility] ORS;  Service: General;  Laterality: N/A;  . XI ROBOTIC ASSISTED THORACOSCOPY- SEGMENTECTOMY Left 11/09/2019   Procedure: XI ROBOTIC ASSISTED THORACOSCOPY-WEDGE RESECTION LEFT LOWER LOBE;  Surgeon: Melrose Nakayama, MD;  [facility];  Service: Thoracic;  Laterality: Left;     Current Outpatient Medications:  .  albuterol (PROVENTIL) (2.5 MG/3ML) 0.083% nebulizer solution, Take 3 mLs (2.5 mg total) by nebulization every 4 (four) hours as needed for wheezing or shortness of breath., Disp: 360 mL, Rfl:  3 .  albuterol (VENTOLIN HFA) 108 (90 Base) MCG/ACT inhaler, Inhale 1-2 puffs into the lungs every 6 (six) hours as needed for wheezing or shortness of breath. , Disp: , Rfl:  .  alprazolam (XANAX) 1 MG tablet, Take 1 tablet (1 mg total) by mouth as directed. 1/2 in am 1 pill qhs, Disp: , Rfl:  .  ARIPiprazole (ABILIFY) 10 MG tablet, Take 10 mg by mouth daily., Disp: , Rfl:  .  aspirin EC 81 MG tablet, Take 81 mg by mouth daily., Disp: , Rfl:  .  budesonide-formoterol (SYMBICORT) 160-4.5 MCG/ACT inhaler, Inhale 2 puffs into the lungs 2 (two) times daily. Rinse mouth out, Disp: 1 Inhaler, Rfl: 1 .  celecoxib (CELEBREX) 200 MG capsule, Take 1 capsule (200 mg total) by mouth daily after breakfast., Disp: 90 capsule, Rfl: 0 .  clopidogrel (PLAVIX) 75 MG tablet, Take 1 tablet (75 mg total) by mouth daily. Reported on 01/15/2016, Disp: 90 tablet, Rfl: 3 .  donepezil (ARICEPT) 5 MG tablet, Take 1 tablet (5 mg total) by mouth at bedtime., Disp: 90 tablet, Rfl: 3 .  FLUoxetine (PROZAC) 20 MG capsule, Take 60 mg by mouth daily., Disp: , Rfl: 1 .  furosemide (LASIX) 40 MG tablet, Take 1 tablet (40 mg total) by mouth daily., Disp: 5 tablet, Rfl: 0 .  memantine (NAMENDA) 5 MG tablet, Take 1 tablet (5 mg total) by mouth 2 (two) times daily. Taking qd, Disp: 180 tablet, Rfl: 3 .  metoprolol succinate (TOPROL-XL) 25 MG 24 hr tablet, Take 1 tablet (25 mg total) by mouth daily., Disp: 90 tablet, Rfl: 3 .  mirabegron ER (MYRBETRIQ) 50 MG TB24 tablet, Take 1 tablet (50 mg total) by mouth daily., Disp: 90 tablet, Rfl: 3 .  Multiple Vitamins-Minerals (MULTIVITAMIN WITH MINERALS) tablet, Take 1 tablet by mouth daily., Disp: , Rfl:  .  ondansetron (ZOFRAN) 4 MG tablet, Take 1 tablet (4 mg total) by mouth every 8 (eight) hours as needed for nausea or vomiting. Fill if ODT not covered, Disp: 40 tablet, Rfl: 0 .  ondansetron (ZOFRAN-ODT) 4 MG disintegrating tablet, Take 1 tablet (4 mg total) by mouth every 8 (eight) hours as  needed for nausea or vomiting., Disp: 40 tablet, Rfl: 0 .  pantoprazole (PROTONIX) 40 MG tablet, Take 1 tablet (40 mg total) by mouth daily. 30 min before breakfast, Disp: 90 tablet, Rfl: 3 .  potassium chloride 20 MEQ TBCR, Take 20 mEq by mouth daily., Disp: 5 tablet, Rfl: 0 .  rosuvastatin (CRESTOR) 10 MG tablet, Take 1 tablet (10 mg total) by mouth at bedtime., Disp: 90 tablet, Rfl: 3 .  tamsulosin (FLOMAX) 0.4 MG CAPS capsule, Take 1 capsule (0.4 mg total) by mouth daily after supper. (Patient taking differently: Take 0.4 mg by mouth daily.), Disp: 90 capsule, Rfl: 3 .  traZODone (DESYREL) 150 MG tablet, Take 1 tablet (150 mg total) by mouth at bedtime., Disp: 90  tablet, Rfl: 3  EXAM:  VITALS per patient if applicable:  GENERAL: alert, oriented, appears well and in no acute distress  Lungs: no respiratory distress   PSYCH/NEURO: pleasant and cooperative, no obvious depression or anxiety, speech and thought processing grossly intact  ASSESSMENT AND PLAN:  Discussed the following assessment and plan:  COVID-19 - Plan: ondansetron (ZOFRAN-ODT) 4 MG disintegrating tablet or tablet zofran  Nausea and vomiting, intractability of vomiting not specified, unspecified vomiting type - Plan: ondansetron (ZOFRAN-ODT) 4 MG disintegrating tablet, ondansetron (ZOFRAN) 4 MG tablet  Dehydration - Plan: ondansetron (ZOFRAN) 4 MG tablet rec go to [facility] UC for labs, IVF if needed declines [facility] ED today   Gatorade zero Water  Chicken broth Oatmeal  Rice  Bananas Crackers Toast  Applesauce  Premier protein shakes    There is no medication other than over the counter meds:  Mucinex dm green label for cough.  Vitamin C 1000 mg daily.  Vitamin D3 4000 Iu (units) daily.  Zinc 100 mg daily.  Quercetin 250-500 mg 2 times per day  Elderberry  Oil of oregano  cepacol or chloroseptic spray  Warm tea with honey and lemon  Hydration  Try to eat though you dont feel like it  Tylenol or Advil   Nasal saline  Flonase  Allergy pill zyrtec, allegra, claritin, xyzal   Monitor pulse oximeter, buy from [place] if oxygen is less than 90 please go to the hospital.      Are you feeling really sick? Shortness of breath, cough, chest pain?, dizziness? Confusion  If so let me know  If worsening, go to hospital or [facility] clinic Urgent care for further treatment    -we discussed possible serious and likely etiologies, options for evaluation and workup, limitations of telemedicine visit vs in person visit, treatment, treatment risks and precautions.    I discussed the assessment and treatment plan with the patient. The patient was provided an opportunity to ask questions and all were answered. The patient agreed with the plan and demonstrated an understanding of the instructions.    Time spent 20 min Delorise Jackson, MD

## 2020-08-21 NOTE — Progress Notes (Signed)
Patient tested positive for Covid this past Saturday.  Symptoms include vomiting and no appetite. Has not eaten much for 3 days. Also having headaches, runny nose, loss of taste and smell, SOB, Weakness/fatigue.

## 2020-08-21 NOTE — Addendum Note (Signed)
Addended by: Orland Mustard on: 08/21/2020 12:42 PM   Modules accepted: Level of Service

## 2020-08-21 NOTE — Patient Instructions (Addendum)
There is no medication other than over the counter meds:  Mucinex dm green label for cough.  Vitamin C 1000 mg daily.  Vitamin D3 4000 Iu (units) daily.  Zinc 100 mg daily.  Quercetin 250-500 mg 2 times per day   Elderberry  Oil of oregano  cepacol or chloroseptic spray  Warm tea with honey and lemon  Hydration  Try to eat though you dont feel like it   Tylenol or Advil  Nasal saline  Flonase  Allergy pill zyrtec, allegra, claritin, xyzal   Monitor pulse oximeter, buy from [place] if oxygen is less than 90 please go to the hospital.        Are you feeling really sick? Shortness of breath, cough, chest pain?, dizziness? Confusion   If so let me know  If worsening, go to hospital or [facility] clinic Urgent care for further treatment

## 2020-08-22 DIAGNOSIS — Z03818 Encounter for observation for suspected exposure to other biological agents ruled out: Secondary | ICD-10-CM | POA: Diagnosis not present

## 2020-08-22 DIAGNOSIS — J441 Chronic obstructive pulmonary disease with (acute) exacerbation: Secondary | ICD-10-CM | POA: Diagnosis not present

## 2020-08-22 DIAGNOSIS — B9689 Other specified bacterial agents as the cause of diseases classified elsewhere: Secondary | ICD-10-CM | POA: Diagnosis not present

## 2020-08-22 DIAGNOSIS — J209 Acute bronchitis, unspecified: Secondary | ICD-10-CM | POA: Diagnosis not present

## 2020-08-22 DIAGNOSIS — R6889 Other general symptoms and signs: Secondary | ICD-10-CM | POA: Diagnosis not present

## 2020-08-22 DIAGNOSIS — J019 Acute sinusitis, unspecified: Secondary | ICD-10-CM | POA: Diagnosis not present

## 2020-08-23 ENCOUNTER — Other Ambulatory Visit: Payer: Self-pay

## 2020-08-23 DIAGNOSIS — R4189 Other symptoms and signs involving cognitive functions and awareness: Secondary | ICD-10-CM

## 2020-08-23 DIAGNOSIS — I2510 Atherosclerotic heart disease of native coronary artery without angina pectoris: Secondary | ICD-10-CM

## 2020-08-23 MED ORDER — MEMANTINE HCL 5 MG PO TABS: 5.0000 mg | ORAL_TABLET | Freq: Two times a day (BID) | ORAL | 3 refills | Status: DC

## 2020-08-23 MED ORDER — METOPROLOL SUCCINATE ER 25 MG PO TB24: 25.0000 mg | ORAL_TABLET | Freq: Every day | ORAL | 3 refills | Status: DC

## 2020-08-28 ENCOUNTER — Other Ambulatory Visit: Payer: Self-pay

## 2020-08-28 DIAGNOSIS — K219 Gastro-esophageal reflux disease without esophagitis: Secondary | ICD-10-CM

## 2020-08-28 MED ORDER — PANTOPRAZOLE SODIUM 40 MG PO TBEC: 40.0000 mg | DELAYED_RELEASE_TABLET | Freq: Every day | ORAL | 3 refills | Status: DC

## 2020-09-05 ENCOUNTER — Telehealth: Payer: Self-pay

## 2020-09-05 NOTE — Telephone Encounter (Signed)
Patient nauseated and vomiting, started 09/04/20 in bed all has only had apple juice today , daughter say he kept down water last night.  Patient DX with Covid 08/19/20 , he started feeling better from Covid a few days later a until 09/04/20 now vomiting. Patient refuses UC or ER. Advised pushing fluids and maybe saltine crackers or toast , BRAT diet, if patient continues needs to be seen UC or ER any further advice.

## 2020-09-05 NOTE — Telephone Encounter (Signed)
I agree if not better rec [facility] Urgent care or ER or call 911  Broth Bananas  Rice  Applesauce  Toast/crackers  Gatorade zero   Ive sent Zofran

## 2020-09-05 NOTE — Telephone Encounter (Signed)
[name] (pt's daughter) called and states that pt is unable to stop throwing up. He is feeling bad and she states that he just got over covid. She asked if we had an appt and there were none for today at time of call. She states that he will refuse to go to [facility] or ED. Please advise [phone]

## 2020-09-05 NOTE — Telephone Encounter (Signed)
Patient DPR daughter is aware of instructions and she advised patient has kept the Gatorade down and toast. If he gets sick again she will insist he be seen at [facility].

## 2020-09-06 NOTE — Telephone Encounter (Signed)
Noted  For your information   

## 2020-09-26 ENCOUNTER — Ambulatory Visit: Payer: Medicare HMO | Admitting: Pulmonary Disease

## 2020-10-19 DIAGNOSIS — F331 Major depressive disorder, recurrent, moderate: Secondary | ICD-10-CM | POA: Diagnosis not present

## 2020-10-19 DIAGNOSIS — K219 Gastro-esophageal reflux disease without esophagitis: Secondary | ICD-10-CM | POA: Diagnosis not present

## 2020-10-19 DIAGNOSIS — E261 Secondary hyperaldosteronism: Secondary | ICD-10-CM | POA: Diagnosis not present

## 2020-10-19 DIAGNOSIS — I25118 Atherosclerotic heart disease of native coronary artery with other forms of angina pectoris: Secondary | ICD-10-CM | POA: Diagnosis not present

## 2020-10-19 DIAGNOSIS — J449 Chronic obstructive pulmonary disease, unspecified: Secondary | ICD-10-CM | POA: Diagnosis not present

## 2020-10-19 DIAGNOSIS — K860 Alcohol-induced chronic pancreatitis: Secondary | ICD-10-CM | POA: Diagnosis not present

## 2020-10-19 DIAGNOSIS — F1010 Alcohol abuse, uncomplicated: Secondary | ICD-10-CM | POA: Diagnosis not present

## 2020-10-19 DIAGNOSIS — I509 Heart failure, unspecified: Secondary | ICD-10-CM | POA: Diagnosis not present

## 2020-10-19 DIAGNOSIS — K7030 Alcoholic cirrhosis of liver without ascites: Secondary | ICD-10-CM | POA: Diagnosis not present

## 2020-10-19 DIAGNOSIS — D692 Other nonthrombocytopenic purpura: Secondary | ICD-10-CM | POA: Diagnosis not present

## 2020-10-19 DIAGNOSIS — F0390 Unspecified dementia without behavioral disturbance: Secondary | ICD-10-CM | POA: Diagnosis not present

## 2020-10-19 DIAGNOSIS — I739 Peripheral vascular disease, unspecified: Secondary | ICD-10-CM | POA: Diagnosis not present

## 2020-10-24 ENCOUNTER — Other Ambulatory Visit: Payer: Self-pay | Admitting: Internal Medicine

## 2020-10-24 DIAGNOSIS — N400 Enlarged prostate without lower urinary tract symptoms: Secondary | ICD-10-CM

## 2020-10-24 NOTE — Progress Notes (Signed)
Cardiology Office Note:    Date:  10/25/2020   ID:  ROBERTT Orr, DOB March 05, 1946, MRN [id number]  PCP:  Orr, Christopher Glow, MD  [facility] Cardiologist:  Kathlyn Sacramento, MD  [facility] Electrophysiologist:  None   Referring MD: Orr, Christopher Mackie *   Chief Complaint: 6 month follow-up  History of Present Illness:    Christopher Orr is a 75 y.o. male with a hx of CAD s/p LAD stenting in 2006 it [facility] DES, tobacco/marijuana.alcohol  use, COPD, HLD, obesity, and hepatitis C (treated), mild dementia who presents for follow-up.   Repat cath in 2011 showed patent stent with mild restenosis. CT in 2019 that showed no aortic aneuryms.   Patient was diagnosed with squamous cell cancer of the lung in 2021  and underwent robotic assisted wedge resection. He did not require chemo or radiation. Plans to follow up with oncology.   Patient was last seen 02/2020 and was stable from a cardiac perspective. Plan to indefinitley continue DAPT with Aspirin and plavix.   Today, the patient reports he has been doing well since the last visit. Had some episodes of vomiting and low appetite for a few weeks. Lives by himself and doesn't cook since he has no appetite. He has been eating out more, eating more fried and fatty foods. Patient has lost about 10 lbs. Had some abdominal pain, but not any more.  No BRBPR, dark stools, tarry stools. No change in BM. He will follow with PCP. He denies chest pain. Endorses SOB, however this is chronic from COPD/emphysemsa, and unchanged. Patient is still smoking, 1ppd. Not gonna quit. Drinks alcohol 3-4 beers every day. Doesn't think that's a lot. Smokes marijuana daily. No lower leg edema, orthopnea, pnd. No lightheadedness, dizziness, palpitations. Patient had labs in October 2021. EKG shows NS with no significant change.   Past Medical History:  Diagnosis Date  . Alcohol abuse    pt states it is marijuana  . Anemia   . Arthritis   . Bipolar disorder  ([place])   . BPH (benign prostatic hypertrophy) with urinary obstruction   . CAD (coronary artery disease)    a. s/p stent to LAD in 2006 at [facility], EF 60% at that time.  . Cancer [facility])    prostate  . CHF (congestive heart failure) ([place])   . Chronic pancreatitis ([place])   . Cirrhosis ([place])   . COPD (chronic obstructive pulmonary disease) ([place])   . Dementia ([place])    early dementia  . Depression   . Diverticulosis   . Drug use   . Dyspnea   . ED (erectile dysfunction)   . Gastritis   . GERD (gastroesophageal reflux disease)   . Headache    occasional migraines  . Hepatitis C    treated  . History of lumbar fusion   . Hyperlipidemia   . Hypertension    patient denies having high blood pressure  . Lung cancer ([place])   . Mitral regurgitation   . Pancreatitis    x 2   . Pneumonia    07/06/18   . PTSD (post-traumatic stress disorder)   . Rib fracture    s/p fall 03/15/19   . Sinus disease   . Urge incontinence     Past Surgical History:  Procedure Laterality Date  . CARDIAC CATHETERIZATION  2006  . cateract  2013  . COLONOSCOPY WITH PROPOFOL N/A 10/04/2019   Procedure: COLONOSCOPY WITH PROPOFOL;  Surgeon: Lucilla Lame, MD;  Location: [facility]  ENDOSCOPY;  Service: Endoscopy;  Laterality: N/A;  . CORONARY ANGIOPLASTY  2006  . CORONARY STENT PLACEMENT  2006   x 1  . ESOPHAGOGASTRODUODENOSCOPY N/A 12/22/2014   Procedure: ESOPHAGOGASTRODUODENOSCOPY (EGD);  Surgeon: [name], MD;  Location: [facility] ENDOSCOPY;  Service: Endoscopy;  Laterality: N/A;  . ESOPHAGOGASTRODUODENOSCOPY (EGD) WITH PROPOFOL N/A 10/04/2019   Procedure: ESOPHAGOGASTRODUODENOSCOPY (EGD) WITH PROPOFOL;  Surgeon: Lucilla Lame, MD;  Location: [facility] ENDOSCOPY;  Service: Endoscopy;  Laterality: N/A;  . HERNIA REPAIR  2015   left groin  . INTERCOSTAL NERVE BLOCK Left 11/09/2019   Procedure: Intercostal Nerve Block;  Surgeon: Melrose Nakayama, MD;  Location: [place];  Service: Thoracic;  Laterality: Left;  . LUMBAR  FUSION    . RADIOACTIVE SEED IMPLANT N/A 04/22/2016   Procedure: RADIOACTIVE SEED IMPLANT/BRACHYTHERAPY IMPLANT;  Surgeon: Hollice Espy, MD;  Location: [facility] ORS;  Service: Urology;  Laterality: N/A;  . ROBOT ASSISTED INGUINAL HERNIA REPAIR Bilateral 07/06/2018   Procedure: ROBOT ASSISTED INGUINAL HERNIA REPAIR;  Surgeon: Jules Husbands, MD;  Location: [facility] ORS;  Service: General;  Laterality: Bilateral;  . TONSILLECTOMY    . UMBILICAL HERNIA REPAIR N/A 07/06/2018   Procedure: LAPAROSCOPIC ROBOT ASSISTED UMBILICAL HERNIA;  Surgeon: Jules Husbands, MD;  Location: [facility] ORS;  Service: General;  Laterality: N/A;  . XI ROBOTIC ASSISTED THORACOSCOPY- SEGMENTECTOMY Left 11/09/2019   Procedure: XI ROBOTIC ASSISTED THORACOSCOPY-WEDGE RESECTION LEFT LOWER LOBE;  Surgeon: Melrose Nakayama, MD;  [facility] OR;  Service: Thoracic;  Laterality: Left;    Current Medications: Current Meds  Medication Sig  . albuterol (PROVENTIL) (2.5 MG/3ML) 0.083% nebulizer solution Take 3 mLs (2.5 mg total) by nebulization every 4 (four) hours as needed for wheezing or shortness of breath.  Marland Kitchen albuterol (VENTOLIN HFA) 108 (90 Base) MCG/ACT inhaler Inhale 1-2 puffs into the lungs every 6 (six) hours as needed for wheezing or shortness of breath.   . alprazolam (XANAX) 1 MG tablet Take 1 tablet (1 mg total) by mouth as directed. 1/2 in am 1 pill qhs  . ARIPiprazole (ABILIFY) 10 MG tablet Take 10 mg by mouth daily.  Marland Kitchen aspirin EC 81 MG tablet Take 81 mg by mouth daily.  . budesonide-formoterol (SYMBICORT) 160-4.5 MCG/ACT inhaler Inhale 2 puffs into the lungs 2 (two) times daily. Rinse mouth out  . celecoxib (CELEBREX) 200 MG capsule Take 1 capsule (200 mg total) by mouth daily after breakfast.  . clopidogrel (PLAVIX) 75 MG tablet Take 1 tablet (75 mg total) by mouth daily. Reported on 01/15/2016  . donepezil (ARICEPT) 5 MG tablet Take 1 tablet (5 mg total) by mouth at bedtime.  Marland Kitchen FLUoxetine (PROZAC) 20 MG capsule Take 60  mg by mouth daily.  . furosemide (LASIX) 40 MG tablet Take 1 tablet (40 mg total) by mouth daily.  . memantine (NAMENDA) 5 MG tablet Take 1 tablet (5 mg total) by mouth 2 (two) times daily. Taking qd  . metoprolol succinate (TOPROL-XL) 25 MG 24 hr tablet Take 1 tablet (25 mg total) by mouth daily.  . mirabegron ER (MYRBETRIQ) 50 MG TB24 tablet Take 1 tablet (50 mg total) by mouth daily.  . Multiple Vitamins-Minerals (MULTIVITAMIN WITH MINERALS) tablet Take 1 tablet by mouth daily.  . nitroGLYCERIN (NITROSTAT) 0.4 MG SL tablet Place 1 tablet (0.4 mg total) under the tongue every 5 (five) minutes as needed for chest pain. Maximum of 3 doses  . ondansetron (ZOFRAN) 4 MG tablet Take 1 tablet (4 mg total) by mouth every 8 (  eight) hours as needed for nausea or vomiting. Fill if ODT not covered  . ondansetron (ZOFRAN-ODT) 4 MG disintegrating tablet Take 1 tablet (4 mg total) by mouth every 8 (eight) hours as needed for nausea or vomiting.  . pantoprazole (PROTONIX) 40 MG tablet Take 1 tablet (40 mg total) by mouth daily. 30 min before breakfast  . potassium chloride 20 MEQ TBCR Take 20 mEq by mouth daily.  . rosuvastatin (CRESTOR) 10 MG tablet Take 1 tablet (10 mg total) by mouth at bedtime.  . tamsulosin (FLOMAX) 0.4 MG CAPS capsule TAKE 1 CAPSULE (0.4 MG TOTAL) BY MOUTH DAILY AFTER SUPPER.  . traZODone (DESYREL) 150 MG tablet Take 1 tablet (150 mg total) by mouth at bedtime.     Allergies:   Patient has no known allergies.   Social History   Socioeconomic History  . Marital status: Divorced    Spouse name: Not on file  . Number of children: 4  . Years of education: Not on file  . Highest education level: GED or equivalent  Occupational History  . Occupation: Aeronautical engineer    Comment: retired  Tobacco Use  . Smoking status: Current Every Day Smoker    Packs/day: 1.00    Types: Cigarettes    Start date: 07/28/1962    Last attempt to quit: 09/01/2019    Years since quitting: 1.1  .  Smokeless tobacco: Never Used  Vaping Use  . Vaping Use: Never used  Substance and Sexual Activity  . Alcohol use: Yes    Alcohol/week: 1.0 standard drink    Types: 1 Cans of beer per week    Comment: states he drinks a pint a day (11/07/19)   . Drug use: Yes    Frequency: 7.0 times per week    Types: Marijuana    Comment: Endorsed heroin 06/2014, UDS also + benzos, opiates, THC, neg for cocaine at that time.  Marland Kitchen Sexual activity: Not Currently  Other Topics Concern  . Not on file  Social History Narrative   Lives at home    Has kids and grandkids   Smoker x 55 years 1 ppd as of 07/2019 he did quit x 2 years    Drinks 1 pt liq qd as of 07/2019    Social Determinants of Health   Financial Resource Strain: Low Risk   . Difficulty of Paying Living Expenses: Not very hard  Food Insecurity: No Food Insecurity  . Worried About Charity fundraiser in the Last Year: Never true  . Ran Out of Food in the Last Year: Never true  Transportation Needs: No Transportation Needs  . Lack of Transportation (Medical): No  . Lack of Transportation (Non-Medical): No  Physical Activity: Not on file  Stress: No Stress Concern Present  . Feeling of Stress : Not at all  Social Connections: Not on file     Family History: The patient's family history includes Alcohol abuse in his sister; Anxiety disorder in his sister; COPD in his brother; Colon cancer in his father; Depression in his sister; Drug abuse in his sister; Heart disease in his father; Obesity in his brother. There is no history of Kidney disease or Prostate cancer.  ROS:   Please see the history of present illness.     All other systems reviewed and are negative.  EKGs/Labs/Other Studies Reviewed:    The following studies were reviewed today: Echo 2019 Mod biatrial enlargement, mod dialted aortic root and ascending aorta, normal LVSF, mild  inf HK, mild LVH, G1DD, trace PR, mild to mod TR, mild MR  EKG:  EKG isordered today.  The ekg  ordered today demonstrates NSR, 61bpm, poor R wave progression, no sig change  Recent Labs: 11/11/2019: Magnesium 2.0 05/21/2020: ALT 17; BUN 16; Creatinine, Ser 1.07; Hemoglobin 12.8; Platelets 259; Potassium 4.4; Sodium 138  Recent Lipid Panel    Component Value Date/Time   CHOL 111 05/21/2020 1051   TRIG 77 05/21/2020 1051   HDL 49 05/21/2020 1051   CHOLHDL 2.3 05/21/2020 1051   VLDL 15 05/21/2020 1051   LDLCALC 47 05/21/2020 1051     Physical Exam:    VS:  BP 102/68   Pulse 61   Ht 6' (1.829 m)   Wt 253 lb (114.8 kg)   BMI 34.31 kg/m     Wt Readings from Last 3 Encounters:  10/25/20 253 lb (114.8 kg)  08/21/20 260 lb (117.9 kg)  06/28/20 266 lb 3.2 oz (120.7 kg)     GEN:  Well nourished, well developed in no acute distress HEENT: Normal NECK: No JVD; No carotid bruits LYMPHATICS: No lymphadenopathy CARDIAC: RRR, no murmurs, rubs, gallops RESPIRATORY:  Clear to auscultation without rales, wheezing or rhonchi  ABDOMEN: Soft, non-tender, non-distended MUSCULOSKELETAL:  No edema; No deformity  SKIN: Warm and dry NEUROLOGIC:  Alert and oriented x 3 PSYCHIATRIC:  Normal affect   ASSESSMENT:    1. Coronary artery disease involving native coronary artery of native heart without angina pectoris   2. Chest pain, unspecified type   3. Tobacco use   4. Polysubstance abuse ([place])   5. Alcohol use   6. Hyperlipidemia LDL goal <70   7. Essential hypertension   8. Chronic diastolic heart failure (HCC)    PLAN:    In order of problems listed above:  HFpEF Euvolemic on exam. Continue current lasix dose. Continue BB. Echo in 2019 showed LVEF 60-65%.   CAD s/p DES to LAD in 2006 Cardiac cath in 2011 showed patent stent with minimal ISR. Per Dr. Fletcher Anon recommendation, will continue DAPT indefinitely with plavix and Aspirin. Patient denies any anginal symtpoms. Continue statin and BB. I will send in SL NTG.   HLD Most recent LDL 47, at goal. Continue statin  HTN BP  well controlled. Continue current medications.   Polysubstance abuse Marijuana, tobacco, alchol abuse. Cessation vs decrease intake encouraged.   Disposition: Follow up in 6 month(s) with APP/MD   Signed, Marisa Hage Ninfa Meeker, PA-C  10/25/2020 3:39 PM    [facility]

## 2020-10-25 ENCOUNTER — Other Ambulatory Visit: Payer: Self-pay

## 2020-10-25 ENCOUNTER — Ambulatory Visit: Payer: Medicare HMO | Admitting: Medical

## 2020-10-25 ENCOUNTER — Encounter: Payer: Self-pay | Admitting: Medical

## 2020-10-25 VITALS — BP 102/68 | HR 61 | Ht 72.0 in | Wt 253.0 lb

## 2020-10-25 DIAGNOSIS — I10 Essential (primary) hypertension: Secondary | ICD-10-CM | POA: Diagnosis not present

## 2020-10-25 DIAGNOSIS — I2510 Atherosclerotic heart disease of native coronary artery without angina pectoris: Secondary | ICD-10-CM | POA: Diagnosis not present

## 2020-10-25 DIAGNOSIS — Z789 Other specified health status: Secondary | ICD-10-CM

## 2020-10-25 DIAGNOSIS — I5032 Chronic diastolic (congestive) heart failure: Secondary | ICD-10-CM

## 2020-10-25 DIAGNOSIS — R079 Chest pain, unspecified: Secondary | ICD-10-CM

## 2020-10-25 DIAGNOSIS — E785 Hyperlipidemia, unspecified: Secondary | ICD-10-CM

## 2020-10-25 DIAGNOSIS — F1090 Alcohol use, unspecified, uncomplicated: Secondary | ICD-10-CM

## 2020-10-25 DIAGNOSIS — Z720 Tobacco use: Secondary | ICD-10-CM

## 2020-10-25 DIAGNOSIS — F1910 Other psychoactive substance abuse, uncomplicated: Secondary | ICD-10-CM | POA: Diagnosis not present

## 2020-10-25 DIAGNOSIS — Z7289 Other problems related to lifestyle: Secondary | ICD-10-CM | POA: Diagnosis not present

## 2020-10-25 MED ORDER — NITROGLYCERIN 0.4 MG SL SUBL: 0.4000 mg | SUBLINGUAL_TABLET | SUBLINGUAL | 3 refills | Status: DC | PRN

## 2020-10-25 NOTE — Patient Instructions (Signed)
Medication Instructions:   Your physician has recommended you make the following change in your medication:   START Nitroglycerin sublingual 0.4mg  AS NEEDED for chest pain   - Place one tablet under the tongue every 5 minutes as needed, maximum of 3 doses.   *If you need a refill on your cardiac medications before your next appointment, please call your pharmacy*   Lab Work: None ordered   Testing/Procedures: None ordered   Follow-Up: At [facility], you and your health needs are our priority.  As part of our continuing mission to provide you with exceptional heart care, we have created designated Provider Care Teams.  These Care Teams include your primary Cardiologist (physician) and Advanced Practice Providers (APPs -  Physician Assistants and Nurse Practitioners) who all work together to provide you with the care you need, when you need it.  We recommend signing up for the patient portal called "MyChart".  Sign up information is provided on this After Visit Summary.  MyChart is used to connect with patients for Virtual Visits (Telemedicine).  Patients are able to view lab/test results, encounter notes, upcoming appointments, etc.  Non-urgent messages can be sent to your provider as well.   To learn more about what you can do with MyChart, go to [email or web address].    Your next appointment:   6 month(s)  The format for your next appointment:   In Person  Provider:   You may see Kathlyn Sacramento, MD or one of the following Advanced Practice Providers on your designated Care Team:    Murray Hodgkins, NP  Christell Faith, PA-C  Marrianne Mood, PA-C  Cadence Alger, [place]  Laurann [place], NP

## 2020-10-28 ENCOUNTER — Other Ambulatory Visit: Payer: Self-pay | Admitting: Internal Medicine

## 2020-10-28 DIAGNOSIS — M159 Polyosteoarthritis, unspecified: Secondary | ICD-10-CM

## 2020-10-29 ENCOUNTER — Ambulatory Visit
Admission: RE | Admit: 2020-10-29 | Discharge: 2020-10-29 | Disposition: A | Discharge status: Home / Self Care | Payer: Medicare HMO | Source: Ambulatory Visit | Attending: Pulmonary Disease | Admitting: Pulmonary Disease

## 2020-10-29 ENCOUNTER — Other Ambulatory Visit: Payer: Self-pay

## 2020-10-29 ENCOUNTER — Telehealth: Payer: Self-pay | Admitting: Pulmonary Disease

## 2020-10-29 ENCOUNTER — Ambulatory Visit: Payer: Medicare HMO | Admitting: Pulmonary Disease

## 2020-10-29 ENCOUNTER — Encounter: Payer: Self-pay | Admitting: Pulmonary Disease

## 2020-10-29 VITALS — BP 120/80 | HR 69 | Temp 97.1°F | Ht 72.0 in | Wt 249.8 lb

## 2020-10-29 DIAGNOSIS — C3492 Malignant neoplasm of unspecified part of left bronchus or lung: Secondary | ICD-10-CM

## 2020-10-29 DIAGNOSIS — R0609 Other forms of dyspnea: Secondary | ICD-10-CM

## 2020-10-29 DIAGNOSIS — R0600 Dyspnea, unspecified: Secondary | ICD-10-CM

## 2020-10-29 DIAGNOSIS — J449 Chronic obstructive pulmonary disease, unspecified: Secondary | ICD-10-CM | POA: Diagnosis not present

## 2020-10-29 DIAGNOSIS — F17210 Nicotine dependence, cigarettes, uncomplicated: Secondary | ICD-10-CM

## 2020-10-29 DIAGNOSIS — J90 Pleural effusion, not elsewhere classified: Secondary | ICD-10-CM | POA: Diagnosis not present

## 2020-10-29 MED ORDER — BREZTRI AEROSPHERE 160-9-4.8 MCG/ACT IN AERO: 2.0000 | INHALATION_SPRAY | Freq: Two times a day (BID) | RESPIRATORY_TRACT | 0 refills | Status: DC

## 2020-10-29 NOTE — Progress Notes (Signed)
Subjective:    Patient ID: Christopher Orr, male    DOB: 10-12-1945, 75 y.o.   MRN: [id number]  HPI Patient is a 75 year old current smoker (relapsed March, 1 PPD) presents for increasing shortness of breath.  We had not seen him here since June 2021.  2 months time after that visit and he never followed up.  Had COVID-19 in January 2022 (positive test for 22 January, confirmed 26 January) did not need admission to the hospital.  He thinks he has noted shortness of breath more pronounced since COVID-19.  Over the last month he has noted more increasing shortness of breath however, he has also started to smoke again 1 pack of cigarettes per day.  He has poor insight as to cause and effect.  He has had cough that has been mostly nonproductive.  When it is productive it is white to grayish sputum.  No orthopnea or paroxysmal nocturnal dyspnea.  No lower extremity edema.  DATA 08 September 2019 preoperative PFTs: FEV1 2.43 L or 71% predicted.  FVC 3.88 L or 83% predicted.  FEV1/FVC 63%.  No bronchodilator response.  Lung volumes normal.  Diffusion capacity 53%.  Insistent with moderate obstructive defect, diffusion capacity decreased suggestive of emphysema 27 December 2019 ambulatory oximetry: No desaturation with exercise oxygen saturations remained 95 to 96% 17 May 2020 CT chest: Status post wedge resection left lower lobe pulmonary nodule.  Irregular parenchymal density surrounding the clips, no evidence of metastatic disease elsewhere, small loculated pleural effusion, attention on follow-up recommended  Review of Systems A 10 point review of systems was performed and it is as noted above otherwise negative.  Patient Active Problem List   Diagnosis Date Noted  . Malignant neoplasm of lower lobe of left lung ([place]) 05/15/2020  . SOB (shortness of breath) on exertion 05/15/2020  . Squamous carcinoma of lung, left ([place]) 11/26/2019  . Goals of care, counseling/discussion 11/26/2019  . Nodule of  lower lobe of left lung 11/09/2019  . Lu cancer ([place]) 11/01/2019  . Abnormal findings on diagnostic imaging of digestive system   . Polyp of transverse colon   . Prediabetes 09/14/2019  . Kidney lesion 08/15/2019  . History of prostate cancer 08/15/2019  . Arthritis 08/15/2019  . Hernia, inguinal, bilateral 08/15/2019  . History of pancreatitis 08/15/2019  . Cirrhosis of liver ([place]) 08/15/2019  . Insomnia 08/15/2019  . Memory impairment 08/15/2019  . Nodule of middle lobe of right lung 08/15/2019  . Tobacco abuse 08/15/2019  . CAD (coronary artery disease)   . Degenerative disc disease, lumbar 11/01/2018  . Cigarette smoker 08/05/2018  . Acute respiratory failure with hypoxia ([place]) 07/06/2018  . Bilateral inguinal hernia, without obstruction or gangrene, recurrent   . Umbilical hernia without obstruction and without gangrene   . Panic attacks 06/14/2018  . Benzodiazepine dependence ([place]) 06/14/2018  . Post traumatic stress disorder (PTSD) 06/14/2018  . Episode of recurrent major depressive disorder ([place]) 06/14/2018  . Coronary artery disease involving native heart with angina pectoris ([place]) 05/31/2018  . Osteoarthritis of multiple joints 05/31/2018  . Hyperlipidemia 05/31/2018  . Chronic viral hepatitis C ([place]) 09/24/2014  . Chest pain 07/28/2014  . COPD (chronic obstructive pulmonary disease) ([place]) 07/28/2014  . Anxiety and depression 07/28/2014  . Aspiration pneumonia ([place]) 07/28/2014  . Elevated troponin I level 07/28/2014  . Acute kidney injury ([place]) 07/28/2014  . GERD (gastroesophageal reflux disease) 07/28/2014   Social History   Tobacco Use  . Smoking status: Former Smoker  Packs/day: 1.00    Years: 3.00    Pack years: 3.00    Types: Cigarettes    Start date: 07/28/1962  . Smokeless tobacco: Never Used  . Tobacco comment: started back in march. 1 pack a day.  Substance Use Topics  . Alcohol use: Yes    Alcohol/week: 1.0 standard drink    Types: 1 Cans of beer  per week    Comment: states he drinks a pint a day (11/07/19)    .     Objective:   Physical Exam BP 120/80 (BP Location: Left Arm, Patient Position: Sitting, Cuff Size: Normal)   Pulse 69   Temp (!) 97.1 F (36.2 C) (Temporal)   Ht 6' (1.829 m)   Wt 249 lb 12.8 oz (113.3 kg)   SpO2 94%   BMI 33.88 kg/m  GENERAL: Obese gentleman, no acute respiratory distress.  Fully ambulatory.  Clothes are permeated with tobacco smell. HEAD: Normocephalic, atraumatic.  EYES: Pupils equal, round, reactive to light.  No scleral icterus.  MOUTH: Nose/mouth/throat not examined due to masking requirements for COVID 19. NECK: Supple.  Thick neck.  No thyromegaly. Trachea midline. No JVD.  No adenopathy. PULMONARY: Good air entry bilaterally.  Lungs with rare end expiratory wheeze otherwise good air movement. CARDIOVASCULAR: S1 and S2. Regular rate and rhythm.  No rubs murmurs or gallops heard. GASTROINTESTINAL: Obese, benign. MUSCULOSKELETAL: No joint deformity, no clubbing, no edema.  NEUROLOGIC: No overt focal deficits. Speech is fluent.  No gait disturbance noticed on ambulation. SKIN: Intact,warm,dry. Nicotine stained fingers. PSYCH: Normal mood and affect.  Ambulatory oximetry: Patient maintained at 90%, able to do 3 laps.      Assessment & Plan:     ICD-10-CM   1. Stage 2 moderate COPD by GOLD classification ([place])  J44.9 Pulmonary Function Test [facility] Only    DG Chest 2 View    Pulse oximetry, overnight   Poorly compensated Patient instructed to quit smoking Trial of Breztri 2 puffs twice a day Will need reassessment with PFTs  2. Squamous carcinoma of lung, left ([facility])  C34.92 Pulmonary Function Test [facility] Only    DG Chest 2 View    Pulse oximetry, overnight   Chest x-ray PA and lateral today  3. Dyspnea on exertion  R06.00    Multiple factors: COPD, obesity/deconditioning, tobacco use PFTs/overnight oximetry Ambulatory oximetry marginal Does not qualify for O2 with ambulation  4.  Tobacco dependence due to cigarettes  F17.210    Patient counseled regards discontinuation of smoking   Meds ordered this encounter  Medications  . Budeson-Glycopyrrol-Formoterol (BREZTRI AEROSPHERE) 160-9-4.8 MCG/ACT AERO    Sig: Inhale 2 puffs into the lungs in the morning and at bedtime.    Dispense:  5.9 g    Refill:  0    Order Specific Question:   Lot Number?    Answer:   [id number]    Order Specific Question:   Expiration Date?    Answer:   02/25/2022    Order Specific Question:   Manufacturer?    Answer:   [employer] [[id number]]    Order Specific Question:   Quantity    Answer:   1  . Budeson-Glycopyrrol-Formoterol (BREZTRI AEROSPHERE) 160-9-4.8 MCG/ACT AERO    Sig: Inhale 2 puffs into the lungs in the morning and at bedtime.    Dispense:  5.9 g    Refill:  0    Order Specific Question:   Lot Number?    Answer:   [id number]  Order Specific Question:   Expiration Date?    Answer:   02/25/2022    Order Specific Question:   Manufacturer?    Answer:   [employer] [[id number]]    Order Specific Question:   Quantity    Answer:   1   Orders Placed This Encounter  Procedures  . DG Chest 2 View    Standing Status:   Future    Number of Occurrences:   1    Standing Expiration Date:   04/30/2021    Order Specific Question:   Reason for Exam (SYMPTOM  OR DIAGNOSIS REQUIRED)    Answer:   copd    Order Specific Question:   Preferred imaging location?    Answer:   [facility]  . Pulmonary Function Test [facility] Only    Standing Status:   Future    Standing Expiration Date:   10/29/2021    Scheduling Instructions:     3 weeks    Order Specific Question:   Full PFT: includes the following: basic spirometry, spirometry pre & post bronchodilator, diffusion capacity (DLCO), lung volumes    Answer:   Full PFT  . Pulse oximetry, overnight    On roomair.   DME:New start    Standing Status:   Future    Standing Expiration Date:   10/29/2021   Discussion:  Patient has noted shortness of breath,  he admits that this is of longstanding however worse since OMBTD-97 and certainly worse over the last month after he resumed smoking 1 pack of cigarettes per day.  So regards discontinuation of smoking.  He was also counseled regards to cause-and-effect.  We will give him a trial of Breztri 2 puffs twice a day and further testing has been ordered as above.  Patient has been instructed not to miss his appointments.  Follow-up appointment has been scheduled for 3 to 4 weeks time with either me or the nurse practitioner.  He is to contact us prior to that time should any new difficulties arise.  Renold Don, MD [facility]   *This note was dictated using voice recognition software/Dragon.  Despite best efforts to proofread, errors can occur which can change the meaning.  Any change was purely unintentional.

## 2020-10-29 NOTE — Telephone Encounter (Signed)
Did not show any pneumonia. He has chronic changes on the left that have been noted after the surgery. Nothing new or acute.    Patient is aware of results. He voiced his understanding and had no further questions.  Nothing further needed at this time.

## 2020-10-29 NOTE — Patient Instructions (Signed)
PLEASE STOP SMOKING!  We are giving you a trial of Breztri 2 puffs twice a day this is basically Symbicort with an extra ingredient in it.  Make sure you rinse your mouth well after use.  Let us know how you do with this inhaler.   DO NOT USE SYMBICORT WHILE ON THE BREZTRI.   We are going to check a chest x-ray, we will schedule breathing tests, you will also have a overnight oxygen test to be done to check on your shortness of breath issues.   We will see you in follow-up in 3 to 4 weeks time with either me or the nurse practitioner.

## 2020-10-30 ENCOUNTER — Telehealth: Payer: Self-pay

## 2020-10-30 ENCOUNTER — Emergency Department
Admission: EM | Admit: 2020-10-30 | Discharge: 2020-10-30 | Disposition: A | Discharge status: Home / Self Care | Payer: Medicare HMO | Attending: Emergency Medicine | Admitting: Emergency Medicine

## 2020-10-30 ENCOUNTER — Other Ambulatory Visit: Payer: Self-pay

## 2020-10-30 ENCOUNTER — Emergency Department: Payer: Medicare HMO

## 2020-10-30 DIAGNOSIS — Z8546 Personal history of malignant neoplasm of prostate: Secondary | ICD-10-CM | POA: Diagnosis not present

## 2020-10-30 DIAGNOSIS — F1010 Alcohol abuse, uncomplicated: Secondary | ICD-10-CM | POA: Diagnosis not present

## 2020-10-30 DIAGNOSIS — R1013 Epigastric pain: Secondary | ICD-10-CM | POA: Diagnosis not present

## 2020-10-30 DIAGNOSIS — I25118 Atherosclerotic heart disease of native coronary artery with other forms of angina pectoris: Secondary | ICD-10-CM | POA: Diagnosis not present

## 2020-10-30 DIAGNOSIS — J449 Chronic obstructive pulmonary disease, unspecified: Secondary | ICD-10-CM | POA: Insufficient documentation

## 2020-10-30 DIAGNOSIS — Z79899 Other long term (current) drug therapy: Secondary | ICD-10-CM | POA: Insufficient documentation

## 2020-10-30 DIAGNOSIS — F0390 Unspecified dementia without behavioral disturbance: Secondary | ICD-10-CM | POA: Insufficient documentation

## 2020-10-30 DIAGNOSIS — Z7951 Long term (current) use of inhaled steroids: Secondary | ICD-10-CM | POA: Diagnosis not present

## 2020-10-30 DIAGNOSIS — Z7982 Long term (current) use of aspirin: Secondary | ICD-10-CM | POA: Diagnosis not present

## 2020-10-30 DIAGNOSIS — Z7902 Long term (current) use of antithrombotics/antiplatelets: Secondary | ICD-10-CM | POA: Insufficient documentation

## 2020-10-30 DIAGNOSIS — R109 Unspecified abdominal pain: Secondary | ICD-10-CM | POA: Diagnosis not present

## 2020-10-30 DIAGNOSIS — R112 Nausea with vomiting, unspecified: Secondary | ICD-10-CM | POA: Diagnosis not present

## 2020-10-30 DIAGNOSIS — Z85118 Personal history of other malignant neoplasm of bronchus and lung: Secondary | ICD-10-CM | POA: Diagnosis not present

## 2020-10-30 DIAGNOSIS — Z87891 Personal history of nicotine dependence: Secondary | ICD-10-CM | POA: Insufficient documentation

## 2020-10-30 DIAGNOSIS — R9431 Abnormal electrocardiogram [ECG] [EKG]: Secondary | ICD-10-CM

## 2020-10-30 DIAGNOSIS — I10 Essential (primary) hypertension: Secondary | ICD-10-CM | POA: Diagnosis not present

## 2020-10-30 LAB — URINALYSIS, COMPLETE (UACMP) WITH MICROSCOPIC
Bacteria, UA: NONE SEEN
Bilirubin Urine: NEGATIVE
Glucose, UA: NEGATIVE mg/dL
Hgb urine dipstick: NEGATIVE
Ketones, ur: 20 mg/dL — AB
Leukocytes,Ua: NEGATIVE
Nitrite: NEGATIVE
Protein, ur: 30 mg/dL — AB
Specific Gravity, Urine: 1.019 (ref 1.005–1.030)
pH: 5 (ref 5.0–8.0)

## 2020-10-30 LAB — BASIC METABOLIC PANEL
Anion gap: 10 (ref 5–15)
BUN: 15 mg/dL (ref 8–23)
CO2: 22 mmol/L (ref 22–32)
Calcium: 9.2 mg/dL (ref 8.9–10.3)
Chloride: 104 mmol/L (ref 98–111)
Creatinine, Ser: 1.14 mg/dL (ref 0.61–1.24)
GFR, Estimated: >60 mL/min (ref >60)
Glucose, Bld: 103 mg/dL — ABNORMAL HIGH (ref 70–99)
Potassium: 4.2 mmol/L (ref 3.5–5.1)
Sodium: 136 mmol/L (ref 135–145)

## 2020-10-30 LAB — MAGNESIUM: Magnesium: 2 mg/dL (ref 1.7–2.4)

## 2020-10-30 LAB — HEPATIC FUNCTION PANEL
ALT: 20 U/L (ref 0–44)
AST: 24 U/L (ref 15–41)
Albumin: 4.1 g/dL (ref 3.5–5.0)
Alkaline Phosphatase: 79 U/L (ref 38–126)
Bilirubin, Direct: 0.2 mg/dL (ref 0.0–0.2)
Indirect Bilirubin: 0.7 mg/dL (ref 0.3–0.9)
Total Bilirubin: 0.9 mg/dL (ref 0.3–1.2)
Total Protein: 7.3 g/dL (ref 6.5–8.1)

## 2020-10-30 LAB — CBC
HCT: 37.3 % — ABNORMAL LOW (ref 39.0–52.0)
Hemoglobin: 12.9 g/dL — ABNORMAL LOW (ref 13.0–17.0)
MCH: 33.4 pg (ref 26.0–34.0)
MCHC: 34.6 g/dL (ref 30.0–36.0)
MCV: 96.6 fL (ref 80.0–100.0)
Platelets: 301 10*3/uL (ref 150–400)
RBC: 3.86 MIL/uL — ABNORMAL LOW (ref 4.22–5.81)
RDW: 14.6 % (ref 11.5–15.5)
WBC: 11.8 10*3/uL — ABNORMAL HIGH (ref 4.0–10.5)
nRBC: 0 % (ref 0.0–0.2)

## 2020-10-30 LAB — LIPASE, BLOOD: Lipase: 33 U/L (ref 11–51)

## 2020-10-30 MED ORDER — IOHEXOL 350 MG/ML SOLN
75.0000 mL | Freq: Once | INTRAVENOUS | Status: AC | PRN
  Administered 2020-10-30: 75 mL via INTRAVENOUS
  Filled 2020-10-30: qty 75

## 2020-10-30 NOTE — Telephone Encounter (Signed)
Patient currently in ED.     [place] RECORD AccessNurse Patient Name: Christopher Orr Gender: Male DOB: 06/11/1946 Age: 75 Y 2 M 20 D Return Phone Number: [phone] (Primary), [phone] (Secondary) Address: City/ State/ Zip: [place]  [address] Client [facility] Client Site [place] - Day Physician McClean-Scocuzza, Olivia Mackie Contact Type Call Who Is Calling Patient / Member / Family / Caregiver Call Type Triage / Clinical Relationship To Patient Self Return Phone Number [phone] (Secondary) Chief Complaint Vomiting Reason for Call Symptomatic / Request for Health Information Initial Comment caller states that he is vomiting every time he eats. He has lost his appetite. He is very nauseous. He has lost ten pounds now. [facility] Translation No Nurse Assessment Nurse: Lucky Cowboy, RN, Levada Dy Date/Time (Eastern Time): 10/30/2020 1:29:44 PM Confirm and document reason for call. If symptomatic, describe symptoms. ---Caller stated that he has been vomiting just about every time he eats for the past 2 weeks. He stated that he has lost 10#. He has urinated in the past 8 hours. He is drinking some water. No diarrhea. Sight of food makes him nauseated. He has not looked at his vomit for any red, black, or green colorations. Does the patient have any new or worsening symptoms? ---Yes Will a triage be completed? ---Yes Related visit to physician within the last 2 weeks? ---Yes Does the PT have any chronic conditions? (i.e. diabetes, asthma, this includes High risk factors for pregnancy, etc.) ---Yes List chronic conditions. ---COPD, cirrhosis of liver, stent in heart, lung & prostate cancers (had operations, overdue for MRI), dementia Is this a behavioral health or substance abuse call? ---No PLEASE NOTE: All  timestamps contained within this report are represented as [place] Standard Time. CONFIDENTIALTY NOTICE: This fax transmission is intended only for the addressee. It contains information that is legally privileged, confidential or otherwise protected from use or disclosure. If you are not the intended recipient, you are strictly prohibited from reviewing, disclosing, copying using or disseminating any of this information or taking any action in reliance on or regarding this information. If you have received this fax in error, please notify us immediately by telephone so that we can arrange for its return to [place]. Phone: [phone], Toll-Free: [phone], Fax: [phone] Page: 2 of 2 Call Id: [id number] Guidelines Guideline Title Affirmed Question Affirmed Notes Nurse Date/Time Eilene Ghazi Time) Cancer - Nausea and Vomiting [1] MODERATE vomiting (e.g., 3 - 5 times/day) AND [2] age > 74 years Lucky Cowboy, [place], Levada Dy 10/30/2020 1:32:29 PM Disp. Time Eilene Ghazi Time) Disposition Final User 10/30/2020 1:33:56 PM Go to ED Now (or PCP triage) Yes Dew, RN, Marin Shutter Disagree/Comply Comply Caller Understands Yes PreDisposition [place] Advice Given Per Guideline GO TO ED NOW (OR PCP TRIAGE): * IF NO PCP (PRIMARY CARE PROVIDER) SECOND-LEVEL TRIAGE: You need to be seen within the next hour. Go to the Reader at _____________ [place] as soon as you can. BRING A BUCKET IN CASE OF VOMITING: * You may wish to bring a bucket, pan, or plastic bag with you in case there is more vomiting during the drive. BRING MEDICINES: * Please bring a list of your current medicines when you go to see the doctor. * It is also a good idea to bring the pill bottles too. This will help the doctor to make certain you are taking the right medicines and  the right dose. CARE ADVICE per Cancer - Nausea and Vomiting (Adult) guideline. Referrals GO TO FACILITY OTHER - SPECIFY

## 2020-10-30 NOTE — ED Triage Notes (Signed)
Pt comes with c/o loss of appetite, weakness and vomiting for about 2 months now. Pt states he was drinking a lot of whiskey but then stopped because his belly was hurting. Pt states now just a couple of beers.  Pt states he just can't keep anything down. Pt states 10lb weight loss.

## 2020-10-30 NOTE — Telephone Encounter (Signed)
If pt agreeable to my chart fee can Rx zofran for nausea and does he want to do a CT scan of abdomen? Has he been drinking alcohol?

## 2020-10-30 NOTE — ED Provider Notes (Signed)
[facility] Emergency Department Provider Note  ____________________________________________   Event Date/Time   First MD Initiated Contact with Patient 10/30/20 1624     (approximate)  I have reviewed the triage vital signs and the nursing notes.   HISTORY  Chief Complaint Weakness   HPI Christopher Orr is a 75 y.o. male with a past medical history of CHF, COPD ongoing tobacco abuse, GERD, lung cancer status post lobectomy, prostate cancer status post radioactive seed implantation and EtOH abuse previously drinking over 2 gallons of whiskey per week having gone down to 3-4 beers per day over the last month who presents for assessment of approximately 1 to 2 months of daily nonbloody nausea vomiting nausea and some epigastric pain seemingly precipitated by any attempt to eat anything.  He denies any headache or earache, sore throat, chest pain, shortness of breath, change in chronic cough, back pain, lower abdominal pain, urinary symptoms, diarrhea, dysuria, rash or extremity pain.  No clear alleviating aggravating factors other than food.  No other acute concerns at this time although he does note he has lost over 10 pounds in the last month.         Past Medical History:  Diagnosis Date  . Alcohol abuse    pt states it is marijuana  . Anemia   . Arthritis   . Bipolar disorder ([place])   . BPH (benign prostatic hypertrophy) with urinary obstruction   . CAD (coronary artery disease)    a. s/p stent to LAD in 2006 at [facility], EF 60% at that time.  . Cancer [facility])    prostate  . CHF (congestive heart failure) ([place])   . Chronic pancreatitis ([place])   . Cirrhosis ([place])   . COPD (chronic obstructive pulmonary disease) ([place])   . Dementia ([place])    early dementia  . Depression   . Diverticulosis   . Drug use   . Dyspnea   . ED (erectile dysfunction)   . Gastritis   . GERD (gastroesophageal reflux disease)   . Headache    occasional migraines  . Hepatitis C     treated  . History of lumbar fusion   . Hyperlipidemia   . Hypertension    patient denies having high blood pressure  . Lung cancer ([place])   . Mitral regurgitation   . Pancreatitis    x 2   . Pneumonia    07/06/18   . PTSD (post-traumatic stress disorder)   . Rib fracture    s/p fall 03/15/19   . Sinus disease   . Urge incontinence     Patient Active Problem List   Diagnosis Date Noted  . Malignant neoplasm of lower lobe of left lung ([place]) 05/15/2020  . SOB (shortness of breath) on exertion 05/15/2020  . Squamous carcinoma of lung, left ([place]) 11/26/2019  . Goals of care, counseling/discussion 11/26/2019  . Nodule of lower lobe of left lung 11/09/2019  . Lung cancer ([place]) 11/01/2019  . Abnormal findings on diagnostic imaging of digestive system   . Polyp of transverse colon   . Prediabetes 09/14/2019  . Kidney lesion 08/15/2019  . History of prostate cancer 08/15/2019  . Arthritis 08/15/2019  . Hernia, inguinal, bilateral 08/15/2019  . History of pancreatitis 08/15/2019  . Cirrhosis of liver ([place]) 08/15/2019  . Insomnia 08/15/2019  . Memory impairment 08/15/2019  . Nodule of middle lobe of right lung 08/15/2019  . Tobacco abuse 08/15/2019  . CAD (coronary artery disease)   .  Degenerative disc disease, lumbar 11/01/2018  . Cigarette smoker 08/05/2018  . Acute respiratory failure with hypoxia ([place]) 07/06/2018  . Bilateral inguinal hernia, without obstruction or gangrene, recurrent   . Umbilical hernia without obstruction and without gangrene   . Panic attacks 06/14/2018  . Benzodiazepine dependence ([place]) 06/14/2018  . Post traumatic stress disorder (PTSD) 06/14/2018  . Episode of recurrent major depressive disorder ([place]) 06/14/2018  . Coronary artery disease involving native heart with angina pectoris ([place]) 05/31/2018  . Osteoarthritis of multiple joints 05/31/2018  . Hyperlipidemia 05/31/2018  . Chronic viral hepatitis C ([place]) 09/24/2014  . Chest pain 07/28/2014   . COPD (chronic obstructive pulmonary disease) ([place]) 07/28/2014  . Anxiety and depression 07/28/2014  . Aspiration pneumonia ([place]) 07/28/2014  . Elevated troponin I level 07/28/2014  . Acute kidney injury ([place]) 07/28/2014  . GERD (gastroesophageal reflux disease) 07/28/2014    Past Surgical History:  Procedure Laterality Date  . CARDIAC CATHETERIZATION  2006  . cateract  2013  . COLONOSCOPY WITH PROPOFOL N/A 10/04/2019   Procedure: COLONOSCOPY WITH PROPOFOL;  Surgeon: Lucilla Lame, MD;  Location: [facility] ENDOSCOPY;  Service: Endoscopy;  Laterality: N/A;  . CORONARY ANGIOPLASTY  2006  . CORONARY STENT PLACEMENT  2006   x 1  . ESOPHAGOGASTRODUODENOSCOPY N/A 12/22/2014   Procedure: ESOPHAGOGASTRODUODENOSCOPY (EGD);  Surgeon: [name], MD;  Location: [facility] ENDOSCOPY;  Service: Endoscopy;  Laterality: N/A;  . ESOPHAGOGASTRODUODENOSCOPY (EGD) WITH PROPOFOL N/A 10/04/2019   Procedure: ESOPHAGOGASTRODUODENOSCOPY (EGD) WITH PROPOFOL;  Surgeon: Lucilla Lame, MD;  Location: [facility] ENDOSCOPY;  Service: Endoscopy;  Laterality: N/A;  . HERNIA REPAIR  2015   left groin  . INTERCOSTAL NERVE BLOCK Left 11/09/2019   Procedure: Intercostal Nerve Block;  Surgeon: Melrose Nakayama, MD;  Location: [place];  Service: Thoracic;  Laterality: Left;  . LUMBAR FUSION    . RADIOACTIVE SEED IMPLANT N/A 04/22/2016   Procedure: RADIOACTIVE SEED IMPLANT/BRACHYTHERAPY IMPLANT;  Surgeon: Hollice Espy, MD;  Location: [facility] ORS;  Service: Urology;  Laterality: N/A;  . ROBOT ASSISTED INGUINAL HERNIA REPAIR Bilateral 07/06/2018   Procedure: ROBOT ASSISTED INGUINAL HERNIA REPAIR;  Surgeon: Jules Husbands, MD;  Location: [facility] ORS;  Service: General;  Laterality: Bilateral;  . TONSILLECTOMY    . UMBILICAL HERNIA REPAIR N/A 07/06/2018   Procedure: LAPAROSCOPIC ROBOT ASSISTED UMBILICAL HERNIA;  Surgeon: Jules Husbands, MD;  Location: [facility] ORS;  Service: General;  Laterality: N/A;  . XI ROBOTIC ASSISTED THORACOSCOPY-  SEGMENTECTOMY Left 11/09/2019   Procedure: XI ROBOTIC ASSISTED THORACOSCOPY-WEDGE RESECTION LEFT LOWER LOBE;  Surgeon: Melrose Nakayama, MD;  Location: [place];  Service: Thoracic;  Laterality: Left;    Prior to Admission medications   Medication Sig Start Date End Date Taking? Authorizing Provider  albuterol (PROVENTIL) (2.5 MG/3ML) 0.083% nebulizer solution Take 3 mLs (2.5 mg total) by nebulization every 4 (four) hours as needed for wheezing or shortness of breath. 09/13/19   McLean-Scocuzza, [name], MD  albuterol (VENTOLIN HFA) 108 (90 Base) MCG/ACT inhaler Inhale 1-2 puffs into the lungs every 6 (six) hours as needed for wheezing or shortness of breath.     [provider]  alprazolam Duanne Moron) 1 MG tablet Take 1 tablet (1 mg total) by mouth as directed. 1/2 in am 1 pill qhs 08/12/19   McLean-Scocuzza, [name], MD  ARIPiprazole (ABILIFY) 10 MG tablet Take 10 mg by mouth daily.    [provider]  aspirin EC 81 MG tablet Take 81 mg by mouth daily.  [provider]  Budeson-Glycopyrrol-Formoterol (BREZTRI AEROSPHERE) 160-9-4.8 MCG/ACT AERO Inhale 2 puffs into the lungs in the morning and at bedtime. 10/29/20   Tyler Pita, MD  Budeson-Glycopyrrol-Formoterol (BREZTRI AEROSPHERE) 160-9-4.8 MCG/ACT AERO Inhale 2 puffs into the lungs in the morning and at bedtime. 10/29/20   Tyler Pita, MD  budesonide-formoterol [facility]) 160-4.5 MCG/ACT inhaler Inhale 2 puffs into the lungs 2 (two) times daily. Rinse mouth out 11/14/19   Gold, Patrick Jupiter E, PA-C  celecoxib (CELEBREX) 200 MG capsule TAKE 1 CAPSULE (200 MG TOTAL) BY MOUTH DAILY AFTER BREAKFAST. 10/29/20   McLean-Scocuzza, [name], MD  clopidogrel (PLAVIX) 75 MG tablet Take 1 tablet (75 mg total) by mouth daily. Reported on 01/15/2016 08/15/19   McLean-Scocuzza, [name], MD  donepezil (ARICEPT) 5 MG tablet Take 1 tablet (5 mg total) by mouth at bedtime. 08/15/19   McLean-Scocuzza, [name], MD  FLUoxetine (PROZAC) 20 MG  capsule Take 60 mg by mouth daily. 06/14/18   [provider]  furosemide (LASIX) 40 MG tablet Take 1 tablet (40 mg total) by mouth daily. 11/15/19   Gold, Wayne E, PA-C  memantine (NAMENDA) 5 MG tablet Take 1 tablet (5 mg total) by mouth 2 (two) times daily. Taking qd 08/23/20   McLean-Scocuzza, [name], MD  metoprolol succinate (TOPROL-XL) 25 MG 24 hr tablet Take 1 tablet (25 mg total) by mouth daily. 08/23/20   McLean-Scocuzza, [name], MD  mirabegron ER (MYRBETRIQ) 50 MG TB24 tablet Take 1 tablet (50 mg total) by mouth daily. 12/07/19   Hollice Espy, MD  Multiple Vitamins-Minerals (MULTIVITAMIN WITH MINERALS) tablet Take 1 tablet by mouth daily.    [provider]  nitroGLYCERIN (NITROSTAT) 0.4 MG SL tablet Place 1 tablet (0.4 mg total) under the tongue every 5 (five) minutes as needed for chest pain. Maximum of 3 doses 10/25/20   Furth, Cadence H, PA-C  pantoprazole (PROTONIX) 40 MG tablet Take 1 tablet (40 mg total) by mouth daily. 30 min before breakfast 08/28/20   McLean-Scocuzza, [name], MD  potassium chloride 20 MEQ TBCR Take 20 mEq by mouth daily. 11/15/19   Gold, Wayne E, PA-C  rosuvastatin (CRESTOR) 10 MG tablet Take 1 tablet (10 mg total) by mouth at bedtime. 07/11/20   McLean-Scocuzza, [name], MD  tamsulosin (FLOMAX) 0.4 MG CAPS capsule TAKE 1 CAPSULE (0.4 MG TOTAL) BY MOUTH DAILY AFTER SUPPER. 10/24/20   McLean-Scocuzza, [name], MD  traZODone (DESYREL) 150 MG tablet Take 1 tablet (150 mg total) by mouth at bedtime. 08/15/19 10/30/20  McLean-Scocuzza, [name], MD    Allergies Patient has no known allergies.  Family History  Problem Relation Age of Onset  . Heart disease Father        Unclear details. Father passed in late 50's 2/2 cancer.  . Colon cancer Father   . Alcohol abuse Sister   . Drug abuse Sister   . Anxiety disorder Sister   . Depression Sister   . COPD Brother   . Obesity Brother   . Kidney disease Neg Hx   . Prostate cancer Neg Hx     Social  History Social History   Tobacco Use  . Smoking status: Former Smoker    Packs/day: 1.00    Years: 3.00    Pack years: 3.00    Types: Cigarettes    Start date: 07/28/1962  . Smokeless tobacco: Never Used  . Tobacco comment: started back in march. 1 pack a day.  Vaping Use  .  Vaping Use: Never used  Substance Use Topics  . Alcohol use: Yes    Alcohol/week: 1.0 standard drink    Types: 1 Cans of beer per week    Comment: 2-3 beers a day  . Drug use: Yes    Frequency: 7.0 times per week    Types: Marijuana    Comment: Endorsed heroin 06/2014, UDS also + benzos, opiates, THC, neg for cocaine at that time.    Review of Systems  Review of Systems  Constitutional: Positive for weight loss. Negative for chills and fever.  HENT: Negative for sore throat.   Eyes: Negative for pain.  Respiratory: Positive for cough ( chronic). Negative for stridor.   Cardiovascular: Negative for chest pain.  Gastrointestinal: Positive for abdominal pain, nausea and vomiting.  Genitourinary: Negative for dysuria.  Musculoskeletal: Negative for myalgias.  Skin: Negative for rash.  Neurological: Negative for seizures, loss of consciousness and headaches.  Psychiatric/Behavioral: Negative for suicidal ideas.  All other systems reviewed and are negative.     ____________________________________________   PHYSICAL EXAM:  VITAL SIGNS: ED Triage Vitals  Enc Vitals Group     BP 10/30/20 1457 118/85     Pulse Rate 10/30/20 1457 73     Resp 10/30/20 1457 19     Temp 10/30/20 1457 98 F (36.7 C)     Temp src --      SpO2 10/30/20 1457 92 %     Weight 10/30/20 1500 248 lb (112.5 kg)     Height 10/30/20 1500 6' (1.829 m)     Head Circumference --      Peak Flow --      Pain Score 10/30/20 1459 0     Pain Loc --      Pain Edu? --      Excl. in [place]? --    Vitals:   10/30/20 1457  BP: 118/85  Pulse: 73  Resp: 19  Temp: 98 F (36.7 C)  SpO2: 92%   Physical Exam Vitals and nursing note  reviewed.  Constitutional:      Appearance: He is well-developed.  HENT:     Head: Normocephalic and atraumatic.     Right Ear: External ear normal.     Left Ear: External ear normal.     Nose: Nose normal.  Eyes:     Conjunctiva/sclera: Conjunctivae normal.  Cardiovascular:     Rate and Rhythm: Normal rate and regular rhythm.     Heart sounds: No murmur heard.   Pulmonary:     Effort: Pulmonary effort is normal. No respiratory distress.     Breath sounds: Normal breath sounds.  Abdominal:     General: There is no distension.     Palpations: Abdomen is soft.     Tenderness: There is no abdominal tenderness. There is no right CVA tenderness or left CVA tenderness.  Musculoskeletal:     Cervical back: Neck supple.  Skin:    General: Skin is warm and dry.     Capillary Refill: Capillary refill takes less than 2 seconds.  Neurological:     Mental Status: He is alert and oriented to person, place, and time.  Psychiatric:        Mood and Affect: Mood normal.     Abdomen is soft nontender throughout.  No CVA tenderness.  ____________________________________________   LABS (all labs ordered are listed, but only abnormal results are displayed)  Labs Reviewed  BASIC METABOLIC PANEL - Abnormal; Notable for the following  components:      Result Value   Glucose, Bld 103 (*)    All other components within normal limits  CBC - Abnormal; Notable for the following components:   WBC 11.8 (*)    RBC 3.86 (*)    Hemoglobin 12.9 (*)    HCT 37.3 (*)    All other components within normal limits  URINALYSIS, COMPLETE (UACMP) WITH MICROSCOPIC - Abnormal; Notable for the following components:   Color, Urine AMBER (*)    APPearance HAZY (*)    Ketones, ur 20 (*)    Protein, ur 30 (*)    All other components within normal limits  LIPASE, BLOOD  HEPATIC FUNCTION PANEL  MAGNESIUM  CBG MONITORING, ED   ____________________________________________  EKG  Sinus rhythm ventricular of  76, left axis deviation, prolonged QTc interval at 506 without other clear evidence of acute ischemia.  ____________________________________________  RADIOLOGY  ED MD interpretation: Evidence of possible diarrhea on CT although no evidence of obstruction or appendicitis.  Nonspecific perinephric stranding and colonic diverticulosis without evidence of diverticulitis.  There is also evidence of aortic atherosclerosis.  No other acute abdominal pelvic pathology of explain patient's symptoms at this time.   Official radiology report(s): CT ABDOMEN PELVIS W CONTRAST  Result Date: 10/30/2020 CLINICAL DATA:  75 year old male with abdominal pain. EXAM: CT ABDOMEN AND PELVIS WITH CONTRAST TECHNIQUE: Multidetector CT imaging of the abdomen and pelvis was performed using the standard protocol following bolus administration of intravenous contrast. CONTRAST:  23mL OMNIPAQUE IOHEXOL 350 MG/ML SOLN COMPARISON:  CT of the abdomen pelvis dated 05/25/2018. chest CT dated 05/17/2020. FINDINGS: Lower chest: Partially visualized subpleural nodular densities involving the left lung base, present on the prior chest CT of 05/17/2020 likely postsurgical scarring. Right lung base atelectasis. There is coronary vascular calcification. No intra-abdominal free air or free fluid. Hepatobiliary: Mild fatty liver. Several subcentimeter hepatic hypodense lesions are too small to characterize. No intrahepatic biliary dilatation. The gallbladder is unremarkable. Pancreas: Unremarkable. No pancreatic ductal dilatation or surrounding inflammatory changes. Spleen: Normal in size without focal abnormality. Adrenals/Urinary Tract: The adrenal glands unremarkable. There is no hydronephrosis on either side. There is symmetric enhancement and excretion of contrast by both kidneys. Small bilateral renal cysts measuring up to 3 cm in the lower pole of the right kidney. Several additional subcentimeter hypodense lesions are too small to characterize.  Mild bilateral perinephric stranding, nonspecific. Correlation with urinalysis recommended to exclude UTI. The visualized ureters appear unremarkable. The urinary bladder is minimally distended. There is mild thickened appearance of the bladder wall which may be partly related to underdistention and partly secondary to chronic bladder outlet obstruction. Stomach/Bowel: There is severe distal colonic diverticulosis and scattered colonic diverticula without active inflammatory changes. There is no bowel obstruction or active inflammation. There is loose stool within the colon compatible with diarrheal state. The appendix is normal. Vascular/Lymphatic: Moderate aortoiliac atherosclerotic disease. The IVC is unremarkable. No portal venous gas. There is no adenopathy. Reproductive: Prostate brachytherapy seeds noted. The seminal vesicles are symmetric. Other: Left inguinal hernia repair. Musculoskeletal: Degenerative changes of the spine. No acute osseous pathology. Lower lumbar posterior fusion. IMPRESSION: 1. Diarrheal state. Correlation with clinical exam and stool cultures recommended. No bowel obstruction. Normal appendix. 2. Nonspecific perinephric stranding. Correlation with urinalysis recommended to exclude UTI. 3. Severe colonic diverticulosis. 4. Aortic Atherosclerosis (ICD10-I70.0). Electronically Signed   By: Anner Crete M.D.   On: 10/30/2020 17:43    ____________________________________________   PROCEDURES  Procedure(s) performed (including  Critical Care):  .1-3 Lead EKG Interpretation Performed by: Lucrezia Starch, MD Authorized by: Lucrezia Starch, MD     Interpretation: normal     ECG rate assessment: normal     Rhythm: sinus rhythm     Ectopy: none     Conduction: normal       ____________________________________________   INITIAL IMPRESSION / ASSESSMENT AND PLAN / ED COURSE      Patient presents with above-stated exam for symptoms of epigastric discomfort associate  with nonbloody nonbilious emesis over the last 1 to 2 months.  He has been taking Zofran intermittently but does not feel this is helping.  Unfortunate patient is still drinking but has some discharge from liquor to beer I strongly counseled him that he should stop this will likely die from his alcohol abuse he does not.  He is afebrile hemodynamically stable arrival.  His abdomen is soft nontender throughout.  Suspect possible mild gastritis related to alcohol abuse.  CT obtained shows no evidence of SBO, diverticulitis, kidney stone, appendicitis or other clear pathology to explain patient's symptoms.  There is evidence of aortic atherosclerosis and some nonspecific perinephric stranding.  All radiology did comment that patient seemed to have "diarrheal state" he denies any diarrhea although suspicion for acute infectious gastroenteritis at this time.  His BMP shows no significant electrolyte or metabolic derangements.  CBC shows slight leukocytosis with WBC count of 11.8 with no evidence of acute anemia.  Lipase is 33 given reassuring CT of suspicion for acute pancreatitis.  Hepatic function panel shows no evidence of cholestasis or acute hepatitis.  UA shows no evidence of infection but does show some ketones and protein.  Magnesium is unremarkable.   Overall unclear etiology of patient's symptoms although certainly possible is related to his ongoing alcohol abuse.  Patient is already on Protonix.  Counseled importance of further EtOH reduction and eventual cessation.  Given stable vitals with reassuring exam and work-up and duration of symptoms greater than 1 month I do believe she is safe for discharge with continued outpatient evaluation.  Advised patient to discontinue the Zofran given his prolonged QTC on ECG today.  Patient voiced understanding and agreement of this.  Discharge stable condition.  Strict return cautions advised and discussed.         ____________________________________________   FINAL CLINICAL IMPRESSION(S) / ED DIAGNOSES  Final diagnoses:  Nausea and vomiting, intractability of vomiting not specified, unspecified vomiting type  ETOH abuse  Epigastric pain  QT prolongation    Medications  iohexol (OMNIPAQUE) 350 MG/ML injection 75 mL (75 mLs Intravenous Contrast Given 10/30/20 1719)     ED Discharge Orders    None       Note:  This document was prepared using Dragon voice recognition software and may include unintentional dictation errors.   Lucrezia Starch, MD 10/30/20 [phone]

## 2020-10-31 ENCOUNTER — Telehealth: Payer: Self-pay

## 2020-10-31 NOTE — Telephone Encounter (Signed)
Patient is aware of date/time of covid test prior to PFT.  

## 2020-11-01 ENCOUNTER — Ambulatory Visit (INDEPENDENT_AMBULATORY_CARE_PROVIDER_SITE_OTHER): Payer: Medicare HMO | Admitting: Internal Medicine

## 2020-11-01 ENCOUNTER — Encounter: Payer: Self-pay | Admitting: Internal Medicine

## 2020-11-01 ENCOUNTER — Other Ambulatory Visit: Payer: Self-pay

## 2020-11-01 VITALS — BP 120/70 | HR 97 | Temp 98.6°F | Ht 72.0 in | Wt 252.5 lb

## 2020-11-01 DIAGNOSIS — F339 Major depressive disorder, recurrent, unspecified: Secondary | ICD-10-CM

## 2020-11-01 DIAGNOSIS — R42 Dizziness and giddiness: Secondary | ICD-10-CM

## 2020-11-01 DIAGNOSIS — Z85118 Personal history of other malignant neoplasm of bronchus and lung: Secondary | ICD-10-CM | POA: Diagnosis not present

## 2020-11-01 DIAGNOSIS — F1010 Alcohol abuse, uncomplicated: Secondary | ICD-10-CM

## 2020-11-01 DIAGNOSIS — R197 Diarrhea, unspecified: Secondary | ICD-10-CM

## 2020-11-01 DIAGNOSIS — R5383 Other fatigue: Secondary | ICD-10-CM

## 2020-11-01 DIAGNOSIS — N3000 Acute cystitis without hematuria: Secondary | ICD-10-CM

## 2020-11-01 DIAGNOSIS — R112 Nausea with vomiting, unspecified: Secondary | ICD-10-CM

## 2020-11-01 DIAGNOSIS — G4700 Insomnia, unspecified: Secondary | ICD-10-CM

## 2020-11-01 DIAGNOSIS — F419 Anxiety disorder, unspecified: Secondary | ICD-10-CM

## 2020-11-01 MED ORDER — ONDANSETRON HCL 4 MG PO TABS: 4.0000 mg | ORAL_TABLET | Freq: Three times a day (TID) | ORAL | 2 refills | Status: DC | PRN

## 2020-11-01 MED ORDER — SUCRALFATE 1 G PO TABS: 1.0000 g | ORAL_TABLET | Freq: Three times a day (TID) | ORAL | 2 refills | Status: DC

## 2020-11-01 NOTE — Telephone Encounter (Signed)
Patient will be seen in office today at 2:30

## 2020-11-01 NOTE — Patient Instructions (Addendum)
Premier protein shake  Try sulcrafate stir in liquid up to 4 x per day before food  Try zofran for nausea  Stop alcohol please   Take care   Bland Diet A bland diet consists of foods that are often soft and do not have a lot of fat, fiber, or extra seasonings. Foods without fat, fiber, or seasoning are easier for the body to digest. They are also less likely to irritate your mouth, throat, stomach, and other parts of your digestive system. A bland diet is sometimes called a BRAT diet. What is my plan? Your health care provider or food and nutrition specialist (dietitian) may recommend specific changes to your diet to prevent symptoms or to treat your symptoms. These changes may include:  Eating small meals often.  Cooking food until it is soft enough to chew easily.  Chewing your food well.  Drinking fluids slowly.  Not eating foods that are very spicy, sour, or fatty.  Not eating citrus fruits, such as oranges and grapefruit. What do I need to know about this diet?  Eat a variety of foods from the bland diet food list.  Do not follow a bland diet longer than needed.  Ask your health care provider whether you should take vitamins or supplements. What foods can I eat? Grains Hot cereals, such as cream of wheat. Rice. Bread, crackers, or tortillas made from refined white flour.   Vegetables Canned or cooked vegetables. Mashed or boiled potatoes. Fruits Bananas. Applesauce. Other types of cooked or canned fruit with the skin and seeds removed, such as canned peaches or pears.   Meats and other proteins Scrambled eggs. Creamy peanut butter or other nut butters. Lean, well-cooked meats, such as chicken or fish. Tofu. Soups or broths.   Dairy Low-fat dairy products, such as milk, cottage cheese, or yogurt. Beverages Water. Herbal tea. Apple juice.   Fats and oils Mild salad dressings. Canola or olive oil. Sweets and desserts Pudding. Custard. Fruit gelatin. Ice cream. The  items listed above may not be a complete list of recommended foods and beverages. Contact a dietitian for more options. What foods are not recommended? Grains Whole grain breads and cereals. Vegetables Raw vegetables. Fruits Raw fruits, especially citrus, berries, or dried fruits. Dairy Whole fat dairy foods. Beverages Caffeinated drinks. Alcohol. Seasonings and condiments Strongly flavored seasonings or condiments. Hot sauce. Salsa. Other foods Spicy foods. Fried foods. Sour foods, such as pickled or fermented foods. Foods with high sugar content. Foods high in fiber. The items listed above may not be a complete list of foods and beverages to avoid. Contact a dietitian for more information. Summary  A bland diet consists of foods that are often soft and do not have a lot of fat, fiber, or extra seasonings.  Foods without fat, fiber, or seasoning are easier for the body to digest.  Check with your health care provider to see how long you should follow this diet plan. It is not meant to be followed for long periods. This information is not intended to replace advice given to you by your health care provider. Make sure you discuss any questions you have with your health care provider. Document Revised: 08/12/2017 Document Reviewed: 08/12/2017 Elsevier Patient Education  2021 [place].  Nausea, Adult Nausea is the feeling that you have an upset stomach or that you are about to vomit. Nausea on its own is not usually a serious concern, but it may be an early sign of a more serious  medical problem. As nausea gets worse, it can lead to vomiting. If vomiting develops, or if you are not able to drink enough fluids, you are at risk of becoming dehydrated. Dehydration can make you tired and thirsty, cause you to have a dry mouth, and decrease how often you urinate. Older adults and people with other diseases or a weak disease-fighting system (immune system) are at higher risk for dehydration.  The main goals of treating your nausea are:  To relieve your nausea.  To limit repeated nausea episodes.  To prevent vomiting and dehydration. Follow these instructions at home: Watch your symptoms for any changes. Tell your health care provider about them. Follow these instructions as told by your health care provider. Eating and drinking  Take an oral rehydration solution (ORS). This is a drink that is sold at pharmacies and retail stores.  Drink clear fluids slowly and in small amounts as you are able. Clear fluids include water, ice chips, low-calorie sports drinks, and fruit juice that has water added (diluted fruit juice).  Eat bland, easy-to-digest foods in small amounts as you are able. These foods include bananas, applesauce, rice, lean meats, toast, and crackers.  Avoid drinking fluids that contain a lot of sugar or caffeine, such as energy drinks, sports drinks, and soda.  Avoid alcohol.  Avoid spicy or fatty foods.      General instructions  Take over-the-counter and prescription medicines only as told by your health care provider.  Rest at home while you recover.  Drink enough fluid to keep your urine pale yellow.  Breathe slowly and deeply when you feel nauseous.  Avoid smelling things that have strong odors.  Wash your hands often using soap and water. If soap and water are not available, use hand sanitizer.  Make sure that all people in your household wash their hands well and often.  Keep all follow-up visits as told by your health care provider. This is important. Contact a health care provider if:  Your nausea gets worse.  Your nausea does not go away after two days.  You vomit.  You cannot drink fluids without vomiting.  You have any of the following: ? New symptoms. ? A fever. ? A headache. ? Muscle cramps. ? A rash. ? Pain while urinating.  You feel light-headed or dizzy. Get help right away if:  You have pain in your chest, neck,  arm, or jaw.  You feel extremely weak or you faint.  You have vomit that is bright red or looks like coffee grounds.  You have bloody or black stools or stools that look like tar.  You have a severe headache, a stiff neck, or both.  You have severe pain, cramping, or bloating in your abdomen.  You have difficulty breathing or are breathing very quickly.  Your heart is beating very quickly.  Your skin feels cold and clammy.  You feel confused.  You have signs of dehydration, such as: ? Dark urine, very little urine, or no urine. ? Cracked lips. ? Dry mouth. ? Sunken eyes. ? Sleepiness. ? Weakness. These symptoms may represent a serious problem that is an emergency. Do not wait to see if the symptoms will go away. Get medical help right away. Call your local emergency services (911 in the [place] Do not drive yourself to the hospital. Summary  Nausea is the feeling that you have an upset stomach or that you are about to vomit. Nausea on its own is not usually a serious  concern, but it may be an early sign of a more serious medical problem.  If vomiting develops, or if you are not able to drink enough fluids, you are at risk of becoming dehydrated.  Follow recommendations for eating and drinking and take over-the-counter and prescription medicines only as told by your health care provider.  Contact a health care provider right away if your symptoms worsen or you have new symptoms.  Keep all follow-up visits as told by your health care provider. This is important. This information is not intended to replace advice given to you by your health care provider. Make sure you discuss any questions you have with your health care provider. Document Revised: 06/14/2019 Document Reviewed: 12/22/2017 Elsevier Patient Education  2021 [employer].  Ganglion Cyst  A ganglion cyst is a non-cancerous, fluid-filled lump of tissue that occurs near a joint, tendon, or ligament. The cyst grows  out of a joint or the lining of a tendon or ligament. Ganglion cysts most often develop in the hand or wrist, but they can also develop in the shoulder, elbow, hip, knee, ankle, or foot. Ganglion cysts are ball-shaped or egg-shaped. Their size can range from the size of a pea to larger than a grape. Increased activity may cause the cyst to get bigger because more fluid starts to build up. What are the causes? The exact cause of this condition is not known, but it may be related to:  Inflammation or irritation around the joint.  An injury or tear in the layers of tissue around the joint (joint capsule).  Repetitive movements or overuse.  History of acute or repeated injury. What increases the risk? You are more likely to develop this condition if:  You are a male.  You are 66-11 years old. What are the signs or symptoms? The main symptom of this condition is a lump. It most often appears on the hand or wrist. In many cases, there are no other symptoms, but a cyst can sometimes cause:  Tingling.  Pain or tenderness.  Numbness.  Weakness or loss of strength in the affected joint.  Decreased range of motion in the affected area of the body.   How is this diagnosed? Ganglion cysts are usually diagnosed based on a physical exam. Your health care provider will feel the lump and may shine a light next to it. If it is a ganglion cyst, the light will likely shine through it. Your health care provider may order an X-ray, ultrasound, MRI, or CT scan to rule out other conditions. How is this treated? Ganglion cysts often go away on their own without treatment. If you have pain or other symptoms, treatment may be needed. Treatment is also needed if the ganglion cyst limits your movement or if it gets infected. Treatment may include:  Wearing a brace or splint on your wrist or finger.  Taking anti-inflammatory medicine.  Having fluid drained from the lump with a needle  (aspiration).  Getting an injection of medicine into the joint to decrease inflammation. This may be corticosteroids, ethanol, or hyaluronidase.  Having surgery to remove the ganglion cyst.  Placing a pad in your shoe or wearing shoes that will not rub against the cyst if it is on your foot. Follow these instructions at home:  Do not press on the ganglion cyst, poke it with a needle, or hit it.  Take over-the-counter and prescription medicines only as told by your health care provider.  If you have a brace or  splint: ? Wear it as told by your health care provider. ? Remove it as told by your health care provider. Ask if you need to remove it when you take a shower or a bath.  Watch your ganglion cyst for any changes.  Keep all follow-up visits as told by your health care provider. This is important. Contact a health care provider if:  Your ganglion cyst becomes larger or more painful.  You have pus coming from the lump.  You have weakness or numbness in the affected area.  You have a fever or chills. Get help right away if:  You have a fever and have any of these in the cyst area: ? Increased redness. ? Red streaks. ? Swelling. Summary  A ganglion cyst is a non-cancerous, fluid-filled lump that occurs near a joint, tendon, or ligament.  Ganglion cysts most often develop in the hand or wrist, but they can also develop in the shoulder, elbow, hip, knee, ankle, or foot.  Ganglion cysts often go away on their own without treatment. This information is not intended to replace advice given to you by your health care provider. Make sure you discuss any questions you have with your health care provider. Document Revised: 10/05/2019 Document Reviewed: 10/05/2019 Elsevier Patient Education  [employer].

## 2020-11-01 NOTE — Progress Notes (Signed)
Chief Complaint  Patient presents with  . Vomiting   F/u with daughter [name] and grandson 75 yo chase 10/30/20 had nausea vomiting CT ab pelvis below he does report episodes of dizziness at times with the room spinning and nausea, he has lost 10 lbs per his scales nad had nausea since covid 07/2020, fatigue and appetite has been low. Nothing tried no vomiting today. H/o alcohol abuse with [place] 2017 cirrhosis he cut back on whiskey use but drinks 1-2 beers advised to stop and given list of resources for alcohol cessation  10/30/20 CT ABDOMEN AND PELVIS WITH CONTRAST  TECHNIQUE: Multidetector CT imaging of the abdomen and pelvis was performed using the standard protocol following bolus administration of intravenous contrast.  CONTRAST:  35mL OMNIPAQUE IOHEXOL 350 MG/ML SOLN  COMPARISON:  CT of the abdomen pelvis dated 05/25/2018. chest CT dated 05/17/2020.  FINDINGS: Lower chest: Partially visualized subpleural nodular densities involving the left lung base, present on the prior chest CT of 05/17/2020 likely postsurgical scarring. Right lung base atelectasis. There is coronary vascular calcification.  No intra-abdominal free air or free fluid.  Hepatobiliary: Mild fatty liver. Several subcentimeter hepatic hypodense lesions are too small to characterize. No intrahepatic biliary dilatation. The gallbladder is unremarkable.  Pancreas: Unremarkable. No pancreatic ductal dilatation or surrounding inflammatory changes.  Spleen: Normal in size without focal abnormality.  Adrenals/Urinary Tract: The adrenal glands unremarkable. There is no hydronephrosis on either side. There is symmetric enhancement and excretion of contrast by both kidneys. Small bilateral renal cysts measuring up to 3 cm in the lower pole of the right kidney. Several additional subcentimeter hypodense lesions are too small to characterize. Mild bilateral perinephric stranding, nonspecific. Correlation with  urinalysis recommended to exclude UTI. The visualized ureters appear unremarkable. The urinary bladder is minimally distended. There is mild thickened appearance of the bladder wall which may be partly related to underdistention and partly secondary to chronic bladder outlet obstruction.  Stomach/Bowel: There is severe distal colonic diverticulosis and scattered colonic diverticula without active inflammatory changes. There is no bowel obstruction or active inflammation. There is loose stool within the colon compatible with diarrheal state. The appendix is normal.  Vascular/Lymphatic: Moderate aortoiliac atherosclerotic disease. The IVC is unremarkable. No portal venous gas. There is no adenopathy.  Reproductive: Prostate brachytherapy seeds noted. The seminal vesicles are symmetric.  Other: Left inguinal hernia repair.  Musculoskeletal: Degenerative changes of the spine. No acute osseous pathology. Lower lumbar posterior fusion.  IMPRESSION: 1. Diarrheal state. Correlation with clinical exam and stool cultures recommended. No bowel obstruction. Normal appendix. 2. Nonspecific perinephric stranding. Correlation with urinalysis recommended to exclude UTI. 3. Severe colonic diverticulosis. 4. Aortic Atherosclerosis (ICD10-I70.0).   Electronically Signed   By: Anner Crete M.D.   On: 10/30/2020 17:43  Review of Systems  Constitutional: Positive for malaise/fatigue. Negative for fever and weight loss.  HENT: Negative for hearing loss.   Eyes: Negative for blurred vision.  Respiratory: Negative for shortness of breath.   Cardiovascular: Negative for chest pain.  Gastrointestinal: Positive for nausea and vomiting.  Skin: Negative for rash.  Neurological: Positive for dizziness.  Psychiatric/Behavioral: Positive for memory loss.   Past Medical History:  Diagnosis Date  . Alcohol abuse    pt states it is marijuana  . Anemia   . Arthritis   . Bipolar disorder  ([place])   . BPH (benign prostatic hypertrophy) with urinary obstruction   . CAD (coronary artery disease)    a. s/p stent to LAD in 2006  at [facility], EF 60% at that time.  . Cancer [facility])    prostate  . CHF (congestive heart failure) ([place])   . Chronic pancreatitis ([place])   . Cirrhosis ([place])   . COPD (chronic obstructive pulmonary disease) ([place])   . Dementia ([place])    early dementia  . Depression   . Diverticulosis   . Drug use   . Dyspnea   . ED (erectile dysfunction)   . Gastritis   . GERD (gastroesophageal reflux disease)   . Headache    occasional migraines  . Hepatitis C    treated  . History of lumbar fusion   . Hyperlipidemia   . Hypertension    patient denies having high blood pressure  . Lung cancer ([place])   . Mitral regurgitation   . Pancreatitis    x 2   . Pneumonia    07/06/18   . PTSD (post-traumatic stress disorder)   . Rib fracture    s/p fall 03/15/19   . Sinus disease   . Urge incontinence    Past Surgical History:  Procedure Laterality Date  . CARDIAC CATHETERIZATION  2006  . cateract  2013  . COLONOSCOPY WITH PROPOFOL N/A 10/04/2019   Procedure: COLONOSCOPY WITH PROPOFOL;  Surgeon: Lucilla Lame, MD;  Location: [facility] ENDOSCOPY;  Service: Endoscopy;  Laterality: N/A;  . CORONARY ANGIOPLASTY  2006  . CORONARY STENT PLACEMENT  2006   x 1  . ESOPHAGOGASTRODUODENOSCOPY N/A 12/22/2014   Procedure: ESOPHAGOGASTRODUODENOSCOPY (EGD);  Surgeon: [name], MD;  Location: [facility] ENDOSCOPY;  Service: Endoscopy;  Laterality: N/A;  . ESOPHAGOGASTRODUODENOSCOPY (EGD) WITH PROPOFOL N/A 10/04/2019   Procedure: ESOPHAGOGASTRODUODENOSCOPY (EGD) WITH PROPOFOL;  Surgeon: Lucilla Lame, MD;  Location: [facility] ENDOSCOPY;  Service: Endoscopy;  Laterality: N/A;  . HERNIA REPAIR  2015   left groin  . INTERCOSTAL NERVE BLOCK Left 11/09/2019   Procedure: Intercostal Nerve Block;  Surgeon: Melrose Nakayama, MD;  Location: [place];  Service: Thoracic;  Laterality: Left;  . LUMBAR  FUSION    . RADIOACTIVE SEED IMPLANT N/A 04/22/2016   Procedure: RADIOACTIVE SEED IMPLANT/BRACHYTHERAPY IMPLANT;  Surgeon: Hollice Espy, MD;  Location: [facility] ORS;  Service: Urology;  Laterality: N/A;  . ROBOT ASSISTED INGUINAL HERNIA REPAIR Bilateral 07/06/2018   Procedure: ROBOT ASSISTED INGUINAL HERNIA REPAIR;  Surgeon: Jules Husbands, MD;  Location: [facility] ORS;  Service: General;  Laterality: Bilateral;  . TONSILLECTOMY    . UMBILICAL HERNIA REPAIR N/A 07/06/2018   Procedure: LAPAROSCOPIC ROBOT ASSISTED UMBILICAL HERNIA;  Surgeon: Jules Husbands, MD;  Location: [facility] ORS;  Service: General;  Laterality: N/A;  . XI ROBOTIC ASSISTED THORACOSCOPY- SEGMENTECTOMY Left 11/09/2019   Procedure: XI ROBOTIC ASSISTED THORACOSCOPY-WEDGE RESECTION LEFT LOWER LOBE;  Surgeon: Melrose Nakayama, MD;  [facility];  Service: Thoracic;  Laterality: Left;   Family History  Problem Relation Age of Onset  . Heart disease Father        Unclear details. Father passed in late 53's 2/2 cancer.  . Colon cancer Father   . Alcohol abuse Sister   . Drug abuse Sister   . Anxiety disorder Sister   . Depression Sister   . COPD Brother   . Obesity Brother   . Kidney disease Neg Hx   . Prostate cancer Neg Hx    Social History   Socioeconomic History  . Marital status: Divorced    Spouse name: Not on file  . Number of children: 4  . Years of education: Not on  file  . Highest education level: GED or equivalent  Occupational History  . Occupation: Aeronautical engineer    Comment: retired  Tobacco Use  . Smoking status: Current Every Day Smoker    Packs/day: 1.00    Years: 3.00    Pack years: 3.00    Types: Cigarettes    Start date: 07/28/1962  . Smokeless tobacco: Never Used  . Tobacco comment: started back in march. 1 pack a day.  Vaping Use  . Vaping Use: Never used  Substance and Sexual Activity  . Alcohol use: Yes    Alcohol/week: 1.0 standard drink    Types: 1 Cans of beer per week    Comment:  2-3 beers a day  . Drug use: Yes    Frequency: 7.0 times per week    Types: Marijuana    Comment: Endorsed heroin 06/2014, UDS also + benzos, opiates, THC, neg for cocaine at that time.  Marland Kitchen Sexual activity: Not Currently  Other Topics Concern  . Not on file  Social History Narrative   Lives at home    Has kids and grandkids   Smoker x 55 years 1 ppd as of 07/2019 he did quit x 2 years    Drinks 1 pt liq qd as of 07/2019    Social Determinants of Health   Financial Resource Strain: Low Risk   . Difficulty of Paying Living Expenses: Not very hard  Food Insecurity: No Food Insecurity  . Worried About Charity fundraiser in the Last Year: Never true  . Ran Out of Food in the Last Year: Never true  Transportation Needs: No Transportation Needs  . Lack of Transportation (Medical): No  . Lack of Transportation (Non-Medical): No  Physical Activity: Not on file  Stress: No Stress Concern Present  . Feeling of Stress : Not at all  Social Connections: Not on file  Intimate Partner Violence: Not At Risk  . Fear of Current or Ex-Partner: No  . Emotionally Abused: No  . Physically Abused: No  . Sexually Abused: No   Current Meds  Medication Sig  . albuterol (PROVENTIL) (2.5 MG/3ML) 0.083% nebulizer solution Take 3 mLs (2.5 mg total) by nebulization every 4 (four) hours as needed for wheezing or shortness of breath.  Marland Kitchen albuterol (VENTOLIN HFA) 108 (90 Base) MCG/ACT inhaler Inhale 1-2 puffs into the lungs every 6 (six) hours as needed for wheezing or shortness of breath.   . alprazolam (XANAX) 1 MG tablet Take 1 tablet (1 mg total) by mouth as directed. 1/2 in am 1 pill qhs  . ARIPiprazole (ABILIFY) 10 MG tablet Take 10 mg by mouth daily.  Marland Kitchen aspirin EC 81 MG tablet Take 81 mg by mouth daily.  . Budeson-Glycopyrrol-Formoterol (BREZTRI AEROSPHERE) 160-9-4.8 MCG/ACT AERO Inhale 2 puffs into the lungs in the morning and at bedtime.  . Budeson-Glycopyrrol-Formoterol (BREZTRI AEROSPHERE) 160-9-4.8  MCG/ACT AERO Inhale 2 puffs into the lungs in the morning and at bedtime.  . budesonide-formoterol (SYMBICORT) 160-4.5 MCG/ACT inhaler Inhale 2 puffs into the lungs 2 (two) times daily. Rinse mouth out  . celecoxib (CELEBREX) 200 MG capsule TAKE 1 CAPSULE (200 MG TOTAL) BY MOUTH DAILY AFTER BREAKFAST.  Marland Kitchen clopidogrel (PLAVIX) 75 MG tablet Take 1 tablet (75 mg total) by mouth daily. Reported on 01/15/2016  . donepezil (ARICEPT) 5 MG tablet Take 1 tablet (5 mg total) by mouth at bedtime.  Marland Kitchen FLUoxetine (PROZAC) 20 MG capsule Take 60 mg by mouth daily.  . furosemide (LASIX)  40 MG tablet Take 1 tablet (40 mg total) by mouth daily.  . memantine (NAMENDA) 5 MG tablet Take 1 tablet (5 mg total) by mouth 2 (two) times daily. Taking qd  . metoprolol succinate (TOPROL-XL) 25 MG 24 hr tablet Take 1 tablet (25 mg total) by mouth daily.  . mirabegron ER (MYRBETRIQ) 50 MG TB24 tablet Take 1 tablet (50 mg total) by mouth daily.  . Multiple Vitamins-Minerals (MULTIVITAMIN WITH MINERALS) tablet Take 1 tablet by mouth daily.  . nitroGLYCERIN (NITROSTAT) 0.4 MG SL tablet Place 1 tablet (0.4 mg total) under the tongue every 5 (five) minutes as needed for chest pain. Maximum of 3 doses  . ondansetron (ZOFRAN) 4 MG tablet Take 1 tablet (4 mg total) by mouth every 8 (eight) hours as needed.  . pantoprazole (PROTONIX) 40 MG tablet Take 1 tablet (40 mg total) by mouth daily. 30 min before breakfast  . potassium chloride 20 MEQ TBCR Take 20 mEq by mouth daily.  . rosuvastatin (CRESTOR) 10 MG tablet Take 1 tablet (10 mg total) by mouth at bedtime.  . sucralfate (CARAFATE) 1 g tablet Take 1 tablet (1 g total) by mouth 4 (four) times daily -  with meals and at bedtime. Stir in [employer] of liquid and let dissolve  . tamsulosin (FLOMAX) 0.4 MG CAPS capsule TAKE 1 CAPSULE (0.4 MG TOTAL) BY MOUTH DAILY AFTER SUPPER.   No Known Allergies Recent Results (from the past 2160 hour(s))  Basic metabolic panel     Status: Abnormal    Collection Time: 10/30/20  3:03 PM  Result Value Ref Range   Sodium 136 135 - 145 mmol/L   Potassium 4.2 3.5 - 5.1 mmol/L   Chloride 104 98 - 111 mmol/L   CO2 22 22 - 32 mmol/L   Glucose, Bld 103 (H) 70 - 99 mg/dL    Comment: Glucose reference range applies only to samples taken after fasting for at least 8 hours.   BUN 15 8 - 23 mg/dL   Creatinine, Ser 1.14 0.61 - 1.24 mg/dL   Calcium 9.2 8.9 - 10.3 mg/dL   GFR, Estimated >60 >60 mL/min    Comment: (NOTE) Calculated using the CKD-EPI Creatinine Equation (2021)    Anion gap 10 5 - 15    Comment: Performed at [facility], [address] [place], [place] [address]  CBC     Status: Abnormal   Collection Time: 10/30/20  3:03 PM  Result Value Ref Range   WBC 11.8 (H) 4.0 - 10.5 K/uL   RBC 3.86 (L) 4.22 - 5.81 MIL/uL   Hemoglobin 12.9 (L) 13.0 - 17.0 g/dL   HCT 37.3 (L) 39.0 - 52.0 %   MCV 96.6 80.0 - 100.0 fL   MCH 33.4 26.0 - 34.0 pg   MCHC 34.6 30.0 - 36.0 g/dL   RDW 14.6 11.5 - 15.5 %   Platelets 301 150 - 400 K/uL   nRBC 0.0 0.0 - 0.2 %    Comment: Performed at [facility], [address] [place], [place] [address]  Lipase, blood     Status: None   Collection Time: 10/30/20  3:03 PM  Result Value Ref Range   Lipase 33 11 - 51 U/L    Comment: Performed at [facility], [address] [place], [place] [address]  Hepatic function panel     Status: None   Collection Time: 10/30/20  3:03 PM  Result Value Ref Range   Total Protein 7.3 6.5 -  8.1 g/dL   Albumin 4.1 3.5 - 5.0 g/dL   AST 24 15 - 41 U/L   ALT 20 0 - 44 U/L   Alkaline Phosphatase 79 38 - 126 U/L   Total Bilirubin 0.9 0.3 - 1.2 mg/dL   Bilirubin, Direct 0.2 0.0 - 0.2 mg/dL   Indirect Bilirubin 0.7 0.3 - 0.9 mg/dL    Comment: Performed at [facility], [address] [place], [place] [address]  Magnesium     Status: None   Collection Time: 10/30/20  3:03 PM  Result Value Ref Range   Magnesium 2.0 1.7 - 2.4 mg/dL     Comment: Performed at [facility], [address] [place], [place] [address]  Urinalysis, Complete w Microscopic     Status: Abnormal   Collection Time: 10/30/20  5:02 PM  Result Value Ref Range   Color, Urine AMBER (A) YELLOW    Comment: BIOCHEMICALS MAY BE AFFECTED BY COLOR   APPearance HAZY (A) CLEAR   Specific Gravity, Urine 1.019 1.005 - 1.030   pH 5.0 5.0 - 8.0   Glucose, UA NEGATIVE NEGATIVE mg/dL   Hgb urine dipstick NEGATIVE NEGATIVE   Bilirubin Urine NEGATIVE NEGATIVE   Ketones, ur 20 (A) NEGATIVE mg/dL   Protein, ur 30 (A) NEGATIVE mg/dL   Nitrite NEGATIVE NEGATIVE   Leukocytes,Ua NEGATIVE NEGATIVE   RBC / HPF 0-5 0 - 5 RBC/hpf   WBC, UA 0-5 0 - 5 WBC/hpf   Bacteria, UA NONE SEEN NONE SEEN   Squamous Epithelial / LPF 0-5 0 - 5   Mucus PRESENT    Hyaline Casts, UA PRESENT     Comment: Performed at [facility], [address] [place],  [address]   Objective  Body mass index is 34.25 kg/m. Wt Readings from Last 3 Encounters:  11/01/20 252 lb 8 oz (114.5 kg)  10/30/20 248 lb (112.5 kg)  10/29/20 249 lb 12.8 oz (113.3 kg)   Temp Readings from Last 3 Encounters:  11/01/20 98.6 F (37 C)  10/30/20 98 F (36.7 C)  10/29/20 (!) 97.1 F (36.2 C) (Temporal)   BP Readings from Last 3 Encounters:  11/01/20 120/70  10/30/20 118/85  10/29/20 120/80   Pulse Readings from Last 3 Encounters:  11/01/20 97  10/30/20 73  10/29/20 69    Physical Exam Vitals and nursing note reviewed.  Constitutional:      Appearance: Normal appearance. He is well-developed and well-groomed.  HENT:     Head: Normocephalic and atraumatic.  Cardiovascular:     Rate and Rhythm: Normal rate and regular rhythm.     Heart sounds: Normal heart sounds. No murmur heard.   Pulmonary:     Effort: Pulmonary effort is normal.     Breath sounds: Normal breath sounds.  Abdominal:     Tenderness: There is no abdominal tenderness.  Skin:    General: Skin is warm  and dry.  Neurological:     General: No focal deficit present.     Mental Status: He is alert and oriented to person, place, and time. Mental status is at baseline.     Gait: Gait normal.  Psychiatric:        Attention and Perception: Attention and perception normal.        Mood and Affect: Mood and affect normal.        Speech: Speech normal.        Behavior: Behavior normal. Behavior is cooperative.  Thought Content: Thought content normal.        Cognition and Memory: Cognition and memory normal.        Judgment: Judgment normal.     Assessment  Plan  Nausea/vomiting/dizziness since 07/2020 see CT ab/pelvis- Plan: ondansetron (ZOFRAN) 4 MG tablet, sucralfate (CARAFATE) 1 g tablet, CT Head Wo Contrast Reviewed CT ab/pelvis 10/30/20 see HPI If n/v continue consider GI consult  rec stop etoh  Diarrhea, unspecified type - Plan: GI pathogen panel by PCR, stool, Clostridium Difficile by PCR([facility])  Acute cystitis without hematuria - Plan: Urine Culture  History of lung cancer - Plan: CT Head Wo Contrast pfts upcoming and CT scan chest 11/16/20   Fatigue, could be due to n/v since 07/2020 and not enough fluids rec alcohol cessation  CT ab/pelvis reviewed  CT chest 4/22   Alcohol abuse  -given list of resources for this   HM Flu shot utd  prevnar utd -->pna 23due in 1 year9/18/21 Tdapconsider future shingrixconsider future covid vx per pt had3/3 pfizer  COPD + Smoker 1 ppd x 55 years as of 07/2019 and had quit x 2 years in the past CT chest sch 05/17/20 s/p LLL resection 4/14/21and CT chest repeat 11/16/20   H/o hep C tx'ed [facility] last 11/13/15 not detected quant will recheck with hep A immune 09/21/14/hep B >1000 09/21/14 and sAg negative  Alcohol abuse drinks 1 pt liquor qd as of 11/01/20 cute back   H/o prostate cancers/p brachytx with seedsPSA 05/18/19 0.22 f/u Dr.Brandon urology 10/2019 and Dr. Massie Maroon rad/onc PSA 05/23/20 <0.01  F/u urology  5/22   Skin-normal exam except for bruisingand reason for visit -seen [place] skin 2021or Dr. Phillip Heal   EGD/colonoscopy 10/04/19 Colonoscopy3/9/21 tubular and hyperplastic Dr. Allen Norris f/u in 5 years   Neurology Dr. Manuella Ghazi Cards Dr. Fletcher Anon  [facility] psych pulm Dr. Patsey Berthold   Provider: Dr. Olivia Mackie McLean-Scocuzza-Internal Medicine

## 2020-11-02 ENCOUNTER — Other Ambulatory Visit
Admission: RE | Admit: 2020-11-02 | Discharge: 2020-11-02 | Disposition: A | Discharge status: Home / Self Care | Payer: Medicare HMO | Source: Ambulatory Visit | Attending: Pulmonary Disease | Admitting: Pulmonary Disease

## 2020-11-02 ENCOUNTER — Other Ambulatory Visit: Payer: Self-pay | Admitting: Internal Medicine

## 2020-11-02 ENCOUNTER — Other Ambulatory Visit
Admission: RE | Admit: 2020-11-02 | Discharge: 2020-11-02 | Disposition: A | Discharge status: Home / Self Care | Payer: Medicare HMO | Source: Ambulatory Visit | Attending: Internal Medicine | Admitting: Internal Medicine

## 2020-11-02 ENCOUNTER — Telehealth: Payer: Self-pay | Admitting: Internal Medicine

## 2020-11-02 DIAGNOSIS — R197 Diarrhea, unspecified: Secondary | ICD-10-CM | POA: Insufficient documentation

## 2020-11-02 DIAGNOSIS — Z20822 Contact with and (suspected) exposure to covid-19: Secondary | ICD-10-CM | POA: Insufficient documentation

## 2020-11-02 DIAGNOSIS — J449 Chronic obstructive pulmonary disease, unspecified: Secondary | ICD-10-CM

## 2020-11-02 DIAGNOSIS — Z01812 Encounter for preprocedural laboratory examination: Secondary | ICD-10-CM | POA: Insufficient documentation

## 2020-11-02 LAB — GASTROINTESTINAL PANEL BY PCR, STOOL (REPLACES STOOL CULTURE)

## 2020-11-02 LAB — SARS CORONAVIRUS 2 (TAT 6-24 HRS): SARS Coronavirus 2: NEGATIVE

## 2020-11-02 LAB — C DIFFICILE QUICK SCREEN W PCR REFLEX
C Diff antigen: NEGATIVE
C Diff interpretation: NOT DETECTED
C Diff toxin: NEGATIVE

## 2020-11-02 NOTE — Telephone Encounter (Signed)
Patient called in wanted to know what medication Dr.Mclean prescribed for him on his last visit he lost his paperwork

## 2020-11-02 NOTE — Telephone Encounter (Signed)
Called Patient and states that he went to the pharmacy and they straightened everything out. No further questions at this time.

## 2020-11-04 LAB — URINE CULTURE: Organism ID, Bacteria: NO GROWTH

## 2020-11-05 ENCOUNTER — Ambulatory Visit: Admission: RE | Admit: 2020-11-05 | Payer: Medicare HMO | Source: Ambulatory Visit

## 2020-11-05 ENCOUNTER — Telehealth: Payer: Self-pay | Admitting: Internal Medicine

## 2020-11-05 ENCOUNTER — Other Ambulatory Visit: Payer: Self-pay

## 2020-11-05 ENCOUNTER — Ambulatory Visit: Payer: Medicare HMO | Attending: Pulmonary Disease

## 2020-11-05 DIAGNOSIS — F17210 Nicotine dependence, cigarettes, uncomplicated: Secondary | ICD-10-CM | POA: Insufficient documentation

## 2020-11-05 DIAGNOSIS — C3492 Malignant neoplasm of unspecified part of left bronchus or lung: Secondary | ICD-10-CM | POA: Diagnosis not present

## 2020-11-05 DIAGNOSIS — J449 Chronic obstructive pulmonary disease, unspecified: Secondary | ICD-10-CM | POA: Insufficient documentation

## 2020-11-05 DIAGNOSIS — R059 Cough, unspecified: Secondary | ICD-10-CM | POA: Insufficient documentation

## 2020-11-05 MED ORDER — ALBUTEROL SULFATE (2.5 MG/3ML) 0.083% IN NEBU
2.5000 mg | INHALATION_SOLUTION | Freq: Once | RESPIRATORY_TRACT | Status: AC
  Administered 2020-11-05: 2.5 mg via RESPIRATORY_TRACT
  Filled 2020-11-05: qty 3

## 2020-11-05 NOTE — Telephone Encounter (Signed)
Called Patient to inform of lab results.   Patient states that he was informed to call [facility] for refills of Xanax in January for 3 months from then. Patient states he has memory issues and did not remember to call. States he has not heard from anyone at [facility] as well.   Patient requesting a temporary supply of Xanax be sent in as he is out. Please advise

## 2020-11-05 NOTE — Telephone Encounter (Signed)
Sarah, call pt per below Meant to cc Mclean and have added her now

## 2020-11-05 NOTE — Telephone Encounter (Signed)
Call pt Im covering for dr Aundra Dubin  He filled xanax 3 days ago and picked up 40 tablets prescribed by Loran Senters in [place].  This is enough for 20 days.  Dr Aundra Dubin hasnt prescribed xanax He wil need continue to follow with Loran Senters for refills of xanax  Dr Aundra Dubin is back next week and can discuss this with him as well

## 2020-11-06 ENCOUNTER — Emergency Department (HOSPITAL_COMMUNITY)
Admission: EM | Admit: 2020-11-06 | Discharge: 2020-11-06 | Disposition: A | Discharge status: Home / Self Care | Payer: Medicare HMO | Attending: Emergency Medicine | Admitting: Emergency Medicine

## 2020-11-06 ENCOUNTER — Encounter (HOSPITAL_COMMUNITY): Payer: Self-pay

## 2020-11-06 ENCOUNTER — Emergency Department (HOSPITAL_COMMUNITY): Payer: Medicare HMO

## 2020-11-06 ENCOUNTER — Other Ambulatory Visit: Payer: Self-pay

## 2020-11-06 DIAGNOSIS — I25119 Atherosclerotic heart disease of native coronary artery with unspecified angina pectoris: Secondary | ICD-10-CM | POA: Diagnosis not present

## 2020-11-06 DIAGNOSIS — Z7951 Long term (current) use of inhaled steroids: Secondary | ICD-10-CM | POA: Diagnosis not present

## 2020-11-06 DIAGNOSIS — Z955 Presence of coronary angioplasty implant and graft: Secondary | ICD-10-CM | POA: Insufficient documentation

## 2020-11-06 DIAGNOSIS — F17210 Nicotine dependence, cigarettes, uncomplicated: Secondary | ICD-10-CM | POA: Diagnosis not present

## 2020-11-06 DIAGNOSIS — I110 Hypertensive heart disease with heart failure: Secondary | ICD-10-CM | POA: Insufficient documentation

## 2020-11-06 DIAGNOSIS — Z7982 Long term (current) use of aspirin: Secondary | ICD-10-CM | POA: Diagnosis not present

## 2020-11-06 DIAGNOSIS — J449 Chronic obstructive pulmonary disease, unspecified: Secondary | ICD-10-CM | POA: Insufficient documentation

## 2020-11-06 DIAGNOSIS — R110 Nausea: Secondary | ICD-10-CM | POA: Insufficient documentation

## 2020-11-06 DIAGNOSIS — R42 Dizziness and giddiness: Secondary | ICD-10-CM | POA: Diagnosis not present

## 2020-11-06 DIAGNOSIS — F0390 Unspecified dementia without behavioral disturbance: Secondary | ICD-10-CM | POA: Diagnosis not present

## 2020-11-06 DIAGNOSIS — M791 Myalgia, unspecified site: Secondary | ICD-10-CM | POA: Insufficient documentation

## 2020-11-06 DIAGNOSIS — Z85118 Personal history of other malignant neoplasm of bronchus and lung: Secondary | ICD-10-CM | POA: Diagnosis not present

## 2020-11-06 DIAGNOSIS — I509 Heart failure, unspecified: Secondary | ICD-10-CM | POA: Diagnosis not present

## 2020-11-06 DIAGNOSIS — R531 Weakness: Secondary | ICD-10-CM | POA: Insufficient documentation

## 2020-11-06 DIAGNOSIS — Z79899 Other long term (current) drug therapy: Secondary | ICD-10-CM | POA: Diagnosis not present

## 2020-11-06 DIAGNOSIS — Z8546 Personal history of malignant neoplasm of prostate: Secondary | ICD-10-CM | POA: Insufficient documentation

## 2020-11-06 LAB — COMPREHENSIVE METABOLIC PANEL
ALT: 21 U/L (ref 0–44)
AST: 23 U/L (ref 15–41)
Albumin: 3.7 g/dL (ref 3.5–5.0)
Alkaline Phosphatase: 78 U/L (ref 38–126)
Anion gap: 8 (ref 5–15)
BUN: 9 mg/dL (ref 8–23)
CO2: 26 mmol/L (ref 22–32)
Calcium: 9.2 mg/dL (ref 8.9–10.3)
Chloride: 103 mmol/L (ref 98–111)
Creatinine, Ser: 1.11 mg/dL (ref 0.61–1.24)
GFR, Estimated: >60 mL/min (ref >60)
Glucose, Bld: 82 mg/dL (ref 70–99)
Potassium: 4.2 mmol/L (ref 3.5–5.1)
Sodium: 137 mmol/L (ref 135–145)
Total Bilirubin: 0.9 mg/dL (ref 0.3–1.2)
Total Protein: 7.3 g/dL (ref 6.5–8.1)

## 2020-11-06 LAB — ETHANOL: Alcohol, Ethyl (B): <10 mg/dL (ref <10)

## 2020-11-06 LAB — CK: Total CK: 195 U/L (ref 49–397)

## 2020-11-06 LAB — CBC
HCT: 37.3 % — ABNORMAL LOW (ref 39.0–52.0)
Hemoglobin: 12.3 g/dL — ABNORMAL LOW (ref 13.0–17.0)
MCH: 32.7 pg (ref 26.0–34.0)
MCHC: 33 g/dL (ref 30.0–36.0)
MCV: 99.2 fL (ref 80.0–100.0)
Platelets: 253 10*3/uL (ref 150–400)
RBC: 3.76 MIL/uL — ABNORMAL LOW (ref 4.22–5.81)
RDW: 14.3 % (ref 11.5–15.5)
WBC: 13.6 10*3/uL — ABNORMAL HIGH (ref 4.0–10.5)
nRBC: 0 % (ref 0.0–0.2)

## 2020-11-06 NOTE — ED Notes (Signed)
Patient verbalized understanding of discharge instructions. Opportunity for questions and answers.  

## 2020-11-06 NOTE — ED Provider Notes (Signed)
[facility] EMERGENCY DEPARTMENT Provider Note   CSN: [id number] Arrival date & time: 11/06/20  1607     History Chief Complaint  Patient presents with  . detox    Christopher Orr is a 75 y.o. male.  75 year old male with prior medical history as detailed below presents for evaluation.  Patient with multiple complaints including nausea, myalgia, weakness, fatigue.  Patient reports symptoms and history of alcohol use and abuse.  Patient also reports longstanding history of Xanax use.  Patient reports his last alcohol intake intake was 2 weeks prior.  He reports that his last Xanax use was 2 weeks prior.  Patient complains of multiple nonspecific complaints.  Patient has been seen by his PCP and other providers for similar complaint.  He is accompanied by his daughter.  They specifically request CT imaging of his brain.  Patient is without focal neuro deficit, focal weakness, visual change, speech change, or other complaint.  The history is provided by the patient and medical records.  Illness Location:  Myalgia, weakness, fatigue Severity:  Mild Onset quality:  Gradual Duration:  1 month Timing:  Intermittent Progression:  Waxing and waning Chronicity:  New      Past Medical History:  Diagnosis Date  . Alcohol abuse    pt states it is marijuana  . Anemia   . Arthritis   . Bipolar disorder ([place])   . BPH (benign prostatic hypertrophy) with urinary obstruction   . CAD (coronary artery disease)    a. s/p stent to LAD in 2006 at [facility], EF 60% at that time.  . Cancer [facility])    prostate  . CHF (congestive heart failure) ([place])   . Chronic pancreatitis ([place])   . Cirrhosis ([place])   . COPD (chronic obstructive pulmonary disease) ([place])   . Dementia ([place])    early dementia  . Depression   . Diverticulosis   . Drug use   . Dyspnea   . ED (erectile dysfunction)   . Gastritis   . GERD (gastroesophageal reflux disease)   . Headache    occasional migraines   . Hepatitis C    treated  . History of lumbar fusion   . Hyperlipidemia   . Hypertension    patient denies having high blood pressure  . Lung cancer ([place])   . Mitral regurgitation   . Pancreatitis    x 2   . Pneumonia    07/06/18   . PTSD (post-traumatic stress disorder)   . Rib fracture    s/p fall 03/15/19   . Sinus disease   . Urge incontinence     Patient Active Problem List   Diagnosis Date Noted  . Alcohol abuse 11/01/2020  . Malignant neoplasm of lower lobe of left lung ([place]) 05/15/2020  . SOB (shortness of breath) on exertion 05/15/2020  . Squamous carcinoma of lung, left ([place]) 11/26/2019  . Goals of care, counseling/discussion 11/26/2019  . Nodule of lower lobe of left lung 11/09/2019  . Lung cancer ([place]) 11/01/2019  . Abnormal findings on diagnostic imaging of digestive system   . Polyp of transverse colon   . Prediabetes 09/14/2019  . Kidney lesion 08/15/2019  . History of prostate cancer 08/15/2019  . Arthritis 08/15/2019  . Hernia, inguinal, bilateral 08/15/2019  . History of pancreatitis 08/15/2019  . Cirrhosis of liver ([place]) 08/15/2019  . Insomnia 08/15/2019  . Memory impairment 08/15/2019  . Nodule of middle lobe of right lung 08/15/2019  . Tobacco abuse 08/15/2019  .  CAD (coronary artery disease)   . Degenerative disc disease, lumbar 11/01/2018  . Cigarette smoker 08/05/2018  . Acute respiratory failure with hypoxia ([place]) 07/06/2018  . Bilateral inguinal hernia, without obstruction or gangrene, recurrent   . Umbilical hernia without obstruction and without gangrene   . Panic attacks 06/14/2018  . Benzodiazepine dependence ([place]) 06/14/2018  . Post traumatic stress disorder (PTSD) 06/14/2018  . Episode of recurrent major depressive disorder ([place]) 06/14/2018  . Coronary artery disease involving native heart with angina pectoris ([place]) 05/31/2018  . Osteoarthritis of multiple joints 05/31/2018  . Hyperlipidemia 05/31/2018  . Chronic viral hepatitis C  ([place]) 09/24/2014  . Chest pain 07/28/2014  . COPD (chronic obstructive pulmonary disease) ([place]) 07/28/2014  . Anxiety and depression 07/28/2014  . Aspiration pneumonia ([place]) 07/28/2014  . Elevated troponin I level 07/28/2014  . Acute kidney injury ([place]) 07/28/2014  . GERD (gastroesophageal reflux disease) 07/28/2014    Past Surgical History:  Procedure Laterality Date  . CARDIAC CATHETERIZATION  2006  . cateract  2013  . COLONOSCOPY WITH PROPOFOL N/A 10/04/2019   Procedure: COLONOSCOPY WITH PROPOFOL;  Surgeon: Lucilla Lame, MD;  Location[facility] ENDOSCOPY;  Service: Endoscopy;  Laterality: N/A;  . CORONARY ANGIOPLASTY  2006  . CORONARY STENT PLACEMENT  2006   x 1  . ESOPHAGOGASTRODUODENOSCOPY N/A 12/22/2014   Procedure: ESOPHAGOGASTRODUODENOSCOPY (EGD);  Surgeon: [name], MD;  Location: [facility] ENDOSCOPY;  Service: Endoscopy;  Laterality: N/A;  . ESOPHAGOGASTRODUODENOSCOPY (EGD) WITH PROPOFOL N/A 10/04/2019   Procedure: ESOPHAGOGASTRODUODENOSCOPY (EGD) WITH PROPOFOL;  Surgeon: Lucilla Lame, MD;  Location: [facility] ENDOSCOPY;  Service: Endoscopy;  Laterality: N/A;  . HERNIA REPAIR  2015   left groin  . INTERCOSTAL NERVE BLOCK Left 11/09/2019   Procedure: Intercostal Nerve Block;  Surgeon: Melrose Nakayama, MD;  Location: [facility];  Service: Thoracic;  Laterality: Left;  . LUMBAR FUSION    . RADIOACTIVE SEED IMPLANT N/A 04/22/2016   Procedure: RADIOACTIVE SEED IMPLANT/BRACHYTHERAPY IMPLANT;  Surgeon: Hollice Espy, MD;  Location: [facility] ORS;  Service: Urology;  Laterality: N/A;  . ROBOT ASSISTED INGUINAL HERNIA REPAIR Bilateral 07/06/2018   Procedure: ROBOT ASSISTED INGUINAL HERNIA REPAIR;  Surgeon: Jules Husbands, MD;  Location: [facility] ORS;  Service: General;  Laterality: Bilateral;  . TONSILLECTOMY    . UMBILICAL HERNIA REPAIR N/A 07/06/2018   Procedure: LAPAROSCOPIC ROBOT ASSISTED UMBILICAL HERNIA;  Surgeon: Jules Husbands, MD;  Location: [facility] ORS;  Service: General;  Laterality: N/A;   . XI ROBOTIC ASSISTED THORACOSCOPY- SEGMENTECTOMY Left 11/09/2019   Procedure: XI ROBOTIC ASSISTED THORACOSCOPY-WEDGE RESECTION LEFT LOWER LOBE;  Surgeon: Melrose Nakayama, MD;  Location[facility];  Service: Thoracic;  Laterality: Left;       Family History  Problem Relation Age of Onset  . Heart disease Father        Unclear details. Father passed in late 50's 2/2 cancer.  . Colon cancer Father   . Alcohol abuse Sister   . Drug abuse Sister   . Anxiety disorder Sister   . Depression Sister   . COPD Brother   . Obesity Brother   . Kidney disease Neg Hx   . Prostate cancer Neg Hx     Social History   Tobacco Use  . Smoking status: Current Every Day Smoker    Packs/day: 1.00    Years: 3.00    Pack years: 3.00    Types: Cigarettes    Start date: 07/28/1962  . Smokeless tobacco: Never Used  . Tobacco comment:  started back in march. 1 pack a day.  Vaping Use  . Vaping Use: Never used  Substance Use Topics  . Alcohol use: Yes    Alcohol/week: 1.0 standard drink    Types: 1 Cans of beer per week    Comment: 2-3 beers a day  . Drug use: Yes    Frequency: 7.0 times per week    Types: Marijuana    Comment: Endorsed heroin 06/2014, UDS also + benzos, opiates, THC, neg for cocaine at that time.    Home Medications Prior to Admission medications   Medication Sig Start Date End Date Taking? Authorizing Provider  albuterol (PROVENTIL) (2.5 MG/3ML) 0.083% nebulizer solution TAKE 3 MLS BY NEBULIZATION EVERY 4 (FOUR) HOURS AS NEEDED FOR WHEEZING OR SHORTNESS OF BREATH. 11/02/20   McLean-Scocuzza, [name], MD  albuterol (VENTOLIN HFA) 108 (90 Base) MCG/ACT inhaler Inhale 1-2 puffs into the lungs every 6 (six) hours as needed for wheezing or shortness of breath.     [provider]  alprazolam Duanne Moron) 1 MG tablet Take 1 tablet (1 mg total) by mouth as directed. 1/2 in am 1 pill qhs 08/12/19   McLean-Scocuzza, [name], MD  ARIPiprazole (ABILIFY) 10 MG tablet Take 10 mg by mouth  daily.    [provider]  aspirin EC 81 MG tablet Take 81 mg by mouth daily.    [provider]  Budeson-Glycopyrrol-Formoterol (BREZTRI AEROSPHERE) 160-9-4.8 MCG/ACT AERO Inhale 2 puffs into the lungs in the morning and at bedtime. 10/29/20   Tyler Pita, MD  Budeson-Glycopyrrol-Formoterol (BREZTRI AEROSPHERE) 160-9-4.8 MCG/ACT AERO Inhale 2 puffs into the lungs in the morning and at bedtime. 10/29/20   Tyler Pita, MD  celecoxib (CELEBREX) 200 MG capsule TAKE 1 CAPSULE (200 MG TOTAL) BY MOUTH DAILY AFTER BREAKFAST. 10/29/20   McLean-Scocuzza, [name], MD  clopidogrel (PLAVIX) 75 MG tablet Take 1 tablet (75 mg total) by mouth daily. Reported on 01/15/2016 08/15/19   McLean-Scocuzza, [name], MD  donepezil (ARICEPT) 5 MG tablet Take 1 tablet (5 mg total) by mouth at bedtime. 08/15/19   McLean-Scocuzza, [name], MD  FLUoxetine (PROZAC) 20 MG capsule Take 60 mg by mouth daily. 06/14/18   [provider]  furosemide (LASIX) 40 MG tablet Take 1 tablet (40 mg total) by mouth daily. 11/15/19   Gold, Wayne E, PA-C  memantine (NAMENDA) 5 MG tablet Take 1 tablet (5 mg total) by mouth 2 (two) times daily. Taking qd 08/23/20   McLean-Scocuzza, [name], MD  metoprolol succinate (TOPROL-XL) 25 MG 24 hr tablet Take 1 tablet (25 mg total) by mouth daily. 08/23/20   McLean-Scocuzza, [name], MD  mirabegron ER (MYRBETRIQ) 50 MG TB24 tablet Take 1 tablet (50 mg total) by mouth daily. 12/07/19   Hollice Espy, MD  Multiple Vitamins-Minerals (MULTIVITAMIN WITH MINERALS) tablet Take 1 tablet by mouth daily.    [provider]  nitroGLYCERIN (NITROSTAT) 0.4 MG SL tablet Place 1 tablet (0.4 mg total) under the tongue every 5 (five) minutes as needed for chest pain. Maximum of 3 doses 10/25/20   Furth, Cadence H, PA-C  ondansetron (ZOFRAN) 4 MG tablet Take 1 tablet (4 mg total) by mouth every 8 (eight) hours as needed. 11/01/20   McLean-Scocuzza, [name], MD  pantoprazole (PROTONIX) 40 MG  tablet Take 1 tablet (40 mg total) by mouth daily. 30 min before breakfast 08/28/20   McLean-Scocuzza, [name], MD  potassium chloride 20 MEQ TBCR Take 20 mEq by mouth daily.  11/15/19   Gold, Wayne E, PA-C  rosuvastatin (CRESTOR) 10 MG tablet Take 1 tablet (10 mg total) by mouth at bedtime. 07/11/20   McLean-Scocuzza, [name], MD  sucralfate (CARAFATE) 1 g tablet Take 1 tablet (1 g total) by mouth 4 (four) times daily -  with meals and at bedtime. Stir in [employer] of liquid and let dissolve 11/01/20   McLean-Scocuzza, [name], MD  SYMBICORT 160-4.5 MCG/ACT inhaler INHALE 2 PUFFS INTO THE LUNGS 2 (TWO) TIMES DAILY. RINSE MOUTH OUT 11/02/20   McLean-Scocuzza, [name], MD  tamsulosin (FLOMAX) 0.4 MG CAPS capsule TAKE 1 CAPSULE (0.4 MG TOTAL) BY MOUTH DAILY AFTER SUPPER. 10/24/20   McLean-Scocuzza, [name], MD  traZODone (DESYREL) 150 MG tablet Take 1 tablet (150 mg total) by mouth at bedtime. 08/15/19 10/30/20  McLean-Scocuzza, [name], MD    Allergies    Patient has no known allergies.  Review of Systems   Review of Systems  All other systems reviewed and are negative.   Physical Exam Updated Vital Signs BP 138/83   Pulse 76   Temp 98.3 F (36.8 C) (Oral)   Resp (!) 23   Ht 6' (1.829 m)   Wt 120.2 kg   SpO2 92%   BMI 35.94 kg/m   Physical Exam Vitals and nursing note reviewed.  Constitutional:      General: He is not in acute distress.    Appearance: Normal appearance. He is well-developed.  HENT:     Head: Normocephalic and atraumatic.     Nose: Nose normal.     Mouth/Throat:     Mouth: Mucous membranes are moist.  Eyes:     Conjunctiva/sclera: Conjunctivae normal.     Pupils: Pupils are equal, round, and reactive to light.  Cardiovascular:     Rate and Rhythm: Normal rate and regular rhythm.     Heart sounds: Normal heart sounds. No murmur heard.   Pulmonary:     Effort: Pulmonary effort is normal. No respiratory distress.     Breath sounds: Normal breath sounds.  Abdominal:      General: There is no distension.     Palpations: Abdomen is soft.     Tenderness: There is no abdominal tenderness.  Musculoskeletal:        General: No deformity. Normal range of motion.     Cervical back: Normal range of motion and neck supple.  Skin:    General: Skin is warm and dry.     Coloration: Skin is not jaundiced.  Neurological:     General: No focal deficit present.     Mental Status: He is alert and oriented to person, place, and time. Mental status is at baseline.     Cranial Nerves: No cranial nerve deficit.     Sensory: No sensory deficit.     Motor: No weakness.     Coordination: Coordination normal.     ED Results / Procedures / Treatments   Labs (all labs ordered are listed, but only abnormal results are displayed) Labs Reviewed  CBC - Abnormal; Notable for the following components:      Result Value   WBC 13.6 (*)    RBC 3.76 (*)    Hemoglobin 12.3 (*)    HCT 37.3 (*)    All other components within normal limits  COMPREHENSIVE METABOLIC PANEL  ETHANOL  CK  RAPID URINE DRUG SCREEN, HOSP PERFORMED    EKG None  Radiology CT Head Wo Contrast  Result Date: 11/06/2020  CLINICAL DATA:  Dizziness. EXAM: CT HEAD WITHOUT CONTRAST TECHNIQUE: Contiguous axial images were obtained from the base of the skull through the vertex without intravenous contrast. COMPARISON:  Head CT from 2015 and MRI brain from 2020 FINDINGS: Brain: Stable age related cerebral atrophy, ventriculomegaly and periventricular white matter disease. No extra-axial fluid collections are identified. No CT findings for acute hemispheric infarction or intracranial hemorrhage. No mass lesions. The brainstem and cerebellum are normal. Vascular: Scattered vascular calcifications. No aneurysm or hyperdense vessels. Skull: No skull fracture or bone lesions. Sinuses/Orbits: The paranasal sinuses and mastoid air cells are clear. The globes are intact. Other: No scalp lesions or scalp hematoma. IMPRESSION: 1.  Age related cerebral atrophy, ventriculomegaly and periventricular white matter disease. 2. No acute intracranial findings or mass lesions. Electronically Signed   By: Marijo Sanes M.D.   On: 11/06/2020 21:49    Procedures Procedures   Medications Ordered in ED Medications - No data to display  ED Course  I have reviewed the triage vital signs and the nursing notes.  Pertinent labs & imaging results that were available during my care of the patient were reviewed by me and considered in my medical decision making (see chart for details).    MDM Rules/Calculators/A&P                          MDM  Screen complete  DAMAN STEFFENHAGEN was evaluated in Emergency Department on 11/06/2020 for the symptoms described in the history of present illness. He was evaluated in the context of the global COVID-19 pandemic, which necessitated consideration that the patient might be at risk for infection with the SARS-CoV-2 virus that causes COVID-19. Institutional protocols and algorithms that pertain to the evaluation of patients at risk for COVID-19 are in a state of rapid change based on information released by regulatory bodies including the CDC and federal and state organizations. These policies and algorithms were followed during the patient's care in the ED.   Patient is presenting with multiple complaints.  Screening exam is not suggestive of significant acute emergent condition.  Screening labs and imaging studies are without significant acute process.  Patient does understand need for close follow-up.  Strict return precautions given and understood.  Of note, patient appears to have received a prescription of Xanax 4 days ago.  Patient did not report this to this provider.   Final Clinical Impression(s) / ED Diagnoses Final diagnoses:  Nausea    Rx / DC Orders ED Discharge Orders    None       Valarie Merino, MD 11/06/20 2323

## 2020-11-06 NOTE — ED Triage Notes (Signed)
Emergency Medicine Provider Triage Evaluation Note  Christopher Orr , a 75 y.o. male  was evaluated in triage.  Pt complains of body aches, n/v, cough, decreased appetetite. Pt states this has been present x1 month, since stopping xanax. sxs are worsening. Nothing makes it better. Seen at [facility] without answers or improvement.   Review of Systems  Positive: Body aches, n/v Negative: Fever, cp  Physical Exam  BP 134/89 (BP Location: Right Arm)   Pulse 74   Temp 98.3 F (36.8 C) (Oral)   Resp (!) 23   Ht 6' (1.829 m)   Wt 120.2 kg   SpO2 94%   BMI 35.94 kg/m  Gen:   Awake, no distress   HEENT:  Atraumatic  Resp:  Normal effort  Cardiac:  Normal rate  Abd:   Nondistended, nontender  MSK:   Moves extremities without difficulty  Neuro:  Speech clear   Medical Decision Making  Medically screening exam initiated at 4:57 PM.  Appropriate orders placed.  Christopher Orr was informed that the remainder of the evaluation will be completed by another provider, this initial triage assessment does not replace that evaluation, and the importance of remaining in the ED until their evaluation is complete.  Clinical Impression  Pt with 1 month of n/v, gen body aches. He is concerned about xanax withdrawal. No seizures, and due to duration, low suspicion for acute withdrawal.    Christopher Heidelberg, PA-C 11/06/20 1701

## 2020-11-06 NOTE — Telephone Encounter (Signed)
Patient calling back in and was informed by the front desk, Joesphine Bare.

## 2020-11-06 NOTE — ED Triage Notes (Signed)
Pt states he feels like he is going into withdraws because he has not had any xanax in a month. Pt states he has had nausea, vomiting, hasn't been able to eat, aching all over and states, "right now, I just need a xanax."

## 2020-11-06 NOTE — Discharge Instructions (Addendum)
Please return for any problem.  °

## 2020-11-06 NOTE — Telephone Encounter (Signed)
Correct yes I dont fill his psych meds I believe he sees [facility] he will need to call them for appt or for further refills psychiatry for all psych related meds never had filled this for him

## 2020-11-07 NOTE — Telephone Encounter (Signed)
noted 

## 2020-11-12 DIAGNOSIS — F331 Major depressive disorder, recurrent, moderate: Secondary | ICD-10-CM | POA: Diagnosis not present

## 2020-11-12 DIAGNOSIS — F4310 Post-traumatic stress disorder, unspecified: Secondary | ICD-10-CM | POA: Diagnosis not present

## 2020-11-12 DIAGNOSIS — F411 Generalized anxiety disorder: Secondary | ICD-10-CM | POA: Diagnosis not present

## 2020-11-12 DIAGNOSIS — F1411 Cocaine abuse, in remission: Secondary | ICD-10-CM | POA: Diagnosis not present

## 2020-11-12 DIAGNOSIS — F515 Nightmare disorder: Secondary | ICD-10-CM | POA: Diagnosis not present

## 2020-11-12 DIAGNOSIS — F410 Panic disorder [episodic paroxysmal anxiety] without agoraphobia: Secondary | ICD-10-CM | POA: Diagnosis not present

## 2020-11-12 DIAGNOSIS — Z8546 Personal history of malignant neoplasm of prostate: Secondary | ICD-10-CM | POA: Diagnosis not present

## 2020-11-12 DIAGNOSIS — J439 Emphysema, unspecified: Secondary | ICD-10-CM | POA: Diagnosis not present

## 2020-11-12 DIAGNOSIS — F0390 Unspecified dementia without behavioral disturbance: Secondary | ICD-10-CM | POA: Diagnosis not present

## 2020-11-12 DIAGNOSIS — F1611 Hallucinogen abuse, in remission: Secondary | ICD-10-CM | POA: Diagnosis not present

## 2020-11-12 DIAGNOSIS — F1220 Cannabis dependence, uncomplicated: Secondary | ICD-10-CM | POA: Diagnosis not present

## 2020-11-12 DIAGNOSIS — F1021 Alcohol dependence, in remission: Secondary | ICD-10-CM | POA: Diagnosis not present

## 2020-11-12 DIAGNOSIS — G4700 Insomnia, unspecified: Secondary | ICD-10-CM | POA: Diagnosis not present

## 2020-11-13 ENCOUNTER — Telehealth: Payer: Self-pay | Admitting: Internal Medicine

## 2020-11-13 ENCOUNTER — Other Ambulatory Visit: Payer: Self-pay | Admitting: *Deleted

## 2020-11-13 NOTE — Telephone Encounter (Signed)
Patient dropped off a renewal form for a handicapp plate. Also needs a refill on his nebolizer, fax number is [phone]

## 2020-11-14 NOTE — Telephone Encounter (Signed)
Placed on your desk. 

## 2020-11-14 NOTE — Telephone Encounter (Signed)
Ok complete bottom form thanks In box

## 2020-11-14 NOTE — Telephone Encounter (Signed)
Left message to return call.  Patient's nebulizer solution sent in with 3 refills on 11/02/20

## 2020-11-15 NOTE — Telephone Encounter (Signed)
Patient came in to the office today and picked up his placard paperwork in person

## 2020-11-16 ENCOUNTER — Ambulatory Visit: Payer: Medicare HMO

## 2020-11-20 ENCOUNTER — Inpatient Hospital Stay (HOSPITAL_BASED_OUTPATIENT_CLINIC_OR_DEPARTMENT_OTHER): Payer: Medicare HMO | Admitting: Oncology

## 2020-11-20 ENCOUNTER — Telehealth: Payer: Self-pay | Admitting: Oncology

## 2020-11-20 ENCOUNTER — Encounter: Payer: Self-pay | Admitting: Oncology

## 2020-11-20 ENCOUNTER — Inpatient Hospital Stay: Payer: Medicare HMO | Attending: Oncology

## 2020-11-20 VITALS — BP 122/86 | HR 66 | Temp 96.5°F | Resp 20 | Wt 245.0 lb

## 2020-11-20 DIAGNOSIS — F0390 Unspecified dementia without behavioral disturbance: Secondary | ICD-10-CM | POA: Insufficient documentation

## 2020-11-20 DIAGNOSIS — I509 Heart failure, unspecified: Secondary | ICD-10-CM | POA: Insufficient documentation

## 2020-11-20 DIAGNOSIS — F1020 Alcohol dependence, uncomplicated: Secondary | ICD-10-CM | POA: Diagnosis not present

## 2020-11-20 DIAGNOSIS — F331 Major depressive disorder, recurrent, moderate: Secondary | ICD-10-CM | POA: Diagnosis not present

## 2020-11-20 DIAGNOSIS — Z85118 Personal history of other malignant neoplasm of bronchus and lung: Secondary | ICD-10-CM

## 2020-11-20 DIAGNOSIS — K7460 Unspecified cirrhosis of liver: Secondary | ICD-10-CM | POA: Insufficient documentation

## 2020-11-20 DIAGNOSIS — B1920 Unspecified viral hepatitis C without hepatic coma: Secondary | ICD-10-CM | POA: Insufficient documentation

## 2020-11-20 DIAGNOSIS — I10 Essential (primary) hypertension: Secondary | ICD-10-CM | POA: Diagnosis not present

## 2020-11-20 DIAGNOSIS — I2510 Atherosclerotic heart disease of native coronary artery without angina pectoris: Secondary | ICD-10-CM | POA: Diagnosis not present

## 2020-11-20 DIAGNOSIS — F17210 Nicotine dependence, cigarettes, uncomplicated: Secondary | ICD-10-CM | POA: Diagnosis not present

## 2020-11-20 DIAGNOSIS — C3492 Malignant neoplasm of unspecified part of left bronchus or lung: Secondary | ICD-10-CM

## 2020-11-20 DIAGNOSIS — J449 Chronic obstructive pulmonary disease, unspecified: Secondary | ICD-10-CM | POA: Diagnosis not present

## 2020-11-20 DIAGNOSIS — Z08 Encounter for follow-up examination after completed treatment for malignant neoplasm: Secondary | ICD-10-CM | POA: Diagnosis not present

## 2020-11-20 DIAGNOSIS — F341 Dysthymic disorder: Secondary | ICD-10-CM | POA: Diagnosis not present

## 2020-11-20 DIAGNOSIS — C3490 Malignant neoplasm of unspecified part of unspecified bronchus or lung: Secondary | ICD-10-CM | POA: Insufficient documentation

## 2020-11-20 DIAGNOSIS — Z79899 Other long term (current) drug therapy: Secondary | ICD-10-CM | POA: Diagnosis not present

## 2020-11-20 DIAGNOSIS — F4310 Post-traumatic stress disorder, unspecified: Secondary | ICD-10-CM | POA: Diagnosis not present

## 2020-11-20 DIAGNOSIS — F319 Bipolar disorder, unspecified: Secondary | ICD-10-CM | POA: Insufficient documentation

## 2020-11-20 DIAGNOSIS — F411 Generalized anxiety disorder: Secondary | ICD-10-CM | POA: Diagnosis not present

## 2020-11-20 LAB — CBC WITH DIFFERENTIAL/PLATELET
Abs Immature Granulocytes: 0.04 10*3/uL (ref 0.00–0.07)
Basophils Absolute: 0.1 10*3/uL (ref 0.0–0.1)
Basophils Relative: 1 %
Eosinophils Absolute: 0.3 10*3/uL (ref 0.0–0.5)
Eosinophils Relative: 3 %
HCT: 36.6 % — ABNORMAL LOW (ref 39.0–52.0)
Hemoglobin: 12.6 g/dL — ABNORMAL LOW (ref 13.0–17.0)
Immature Granulocytes: 0 %
Lymphocytes Relative: 19 %
Lymphs Abs: 1.9 10*3/uL (ref 0.7–4.0)
MCH: 33.2 pg (ref 26.0–34.0)
MCHC: 34.4 g/dL (ref 30.0–36.0)
MCV: 96.6 fL (ref 80.0–100.0)
Monocytes Absolute: 0.9 10*3/uL (ref 0.1–1.0)
Monocytes Relative: 9 %
Neutro Abs: 6.8 10*3/uL (ref 1.7–7.7)
Neutrophils Relative %: 68 %
Platelets: 357 10*3/uL (ref 150–400)
RBC: 3.79 MIL/uL — ABNORMAL LOW (ref 4.22–5.81)
RDW: 14.1 % (ref 11.5–15.5)
WBC: 9.9 10*3/uL (ref 4.0–10.5)
nRBC: 0 % (ref 0.0–0.2)

## 2020-11-20 LAB — COMPREHENSIVE METABOLIC PANEL
ALT: 22 U/L (ref 0–44)
AST: 21 U/L (ref 15–41)
Albumin: 3.8 g/dL (ref 3.5–5.0)
Alkaline Phosphatase: 73 U/L (ref 38–126)
Anion gap: 10 (ref 5–15)
BUN: 22 mg/dL (ref 8–23)
CO2: 24 mmol/L (ref 22–32)
Calcium: 9 mg/dL (ref 8.9–10.3)
Chloride: 104 mmol/L (ref 98–111)
Creatinine, Ser: 1.1 mg/dL (ref 0.61–1.24)
GFR, Estimated: >60 mL/min (ref >60)
Glucose, Bld: 113 mg/dL — ABNORMAL HIGH (ref 70–99)
Potassium: 4.1 mmol/L (ref 3.5–5.1)
Sodium: 138 mmol/L (ref 135–145)
Total Bilirubin: 0.5 mg/dL (ref 0.3–1.2)
Total Protein: 7.4 g/dL (ref 6.5–8.1)

## 2020-11-20 NOTE — Progress Notes (Signed)
Hematology/Oncology Consult note [facility]  Telephone:([phone] [phone]  Patient Care Team: McLean-Scocuzza, [name], MD as PCP - General (Internal Medicine) Wellington Hampshire, MD as PCP - Cardiology (Cardiology) Telford Nab, RN as Oncology Nurse Navigator   Name of the patient: Christopher Orr  [id number]  1945-09-15   Date of visit: 11/20/20  Diagnosis- Stage IA lung cancer s/p surgery  Chief complaint/ Reason for visit-routine follow-up of lung cancer  Heme/Onc history: Patient is a 75 year old malewith past medical history significant for hypertension, hepatitis C, cirrhosis, COPD among other medical problems. He is not on any baseline oxygen. Patient underwent CT chest without contrast which showed a 1 cm left lower lobe pulmonary nodule concerning for bronchogenic carcinoma which was new as compared to his prior scan in 2006. No thoracic adenopathy.  PET CT scan showed hypermetabolic left lower lobe lesion measuring 1.2 x 1.2 cm. No evidence of intrathoracic adenopathy. Also noted to have hypermetabolic lesion in the left parotid gland 1.7 x 2 cm. 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 Patient underwent wedge resection of the left lower lobe lung nodule which showed a 1.6 cm squamous cell carcinoma of. No involvement of visceral. Margins negative. 5 lymph nodes negative for malignancy.  Interval history-patient reports feeling at his baseline.  He has some fatigue and exertional shortness of breath on moderate exertion.  He mostly leads a sedentary life.  Denies any cough or worsening shortness of breath  ECOG PS- 1 Pain scale- 0   Review of systems- Review of Systems  Constitutional: Positive for malaise/fatigue. Negative for chills, fever and weight loss.  HENT: Negative for congestion, ear discharge and nosebleeds.   Eyes: Negative for blurred vision.  Respiratory: Negative for  cough, hemoptysis, sputum production, shortness of breath and wheezing.   Cardiovascular: Negative for chest pain, palpitations, orthopnea and claudication.  Gastrointestinal: Negative for abdominal pain, blood in stool, constipation, diarrhea, heartburn, melena, nausea and vomiting.  Genitourinary: Negative for dysuria, flank pain, frequency, hematuria and urgency.  Musculoskeletal: Negative for back pain, joint pain and myalgias.  Skin: Negative for rash.  Neurological: Negative for dizziness, tingling, focal weakness, seizures, weakness and headaches.  Endo/Heme/Allergies: Does not bruise/bleed easily.  Psychiatric/Behavioral: Negative for depression and suicidal ideas. The patient does not have insomnia.        No Known Allergies   Past Medical History:  Diagnosis Date  . Alcohol abuse    pt states it is marijuana  . Anemia   . Arthritis   . Bipolar disorder ([place])   . BPH (benign prostatic hypertrophy) with urinary obstruction   . CAD (coronary artery disease)    a. s/p stent to LAD in 2006 at [facility], EF 60% at that time.  . Cancer [facility])    prostate  . CHF (congestive heart failure) ([place])   . Chronic pancreatitis ([place])   . Cirrhosis ([place])   . COPD (chronic obstructive pulmonary disease) ([place])   . Dementia ([place])    early dementia  . Depression   . Diverticulosis   . Drug use   . Dyspnea   . ED (erectile dysfunction)   . Gastritis   . GERD (gastroesophageal reflux disease)   . Headache    occasional migraines  . Hepatitis C    treated  . History of lumbar fusion   . Hyperlipidemia   . Hypertension    patient denies having high blood pressure  . Lung  cancer ([place])   . Mitral regurgitation   . Pancreatitis    x 2   . Pneumonia    07/06/18   . PTSD (post-traumatic stress disorder)   . Rib fracture    s/p fall 03/15/19   . Sinus disease   . Urge incontinence      Past Surgical History:  Procedure Laterality Date  . CARDIAC CATHETERIZATION  2006  . cateract   2013  . COLONOSCOPY WITH PROPOFOL N/A 10/04/2019   Procedure: COLONOSCOPY WITH PROPOFOL;  Surgeon: Lucilla Lame, MD;  Location: [facility] ENDOSCOPY;  Service: Endoscopy;  Laterality: N/A;  . CORONARY ANGIOPLASTY  2006  . CORONARY STENT PLACEMENT  2006   x 1  . ESOPHAGOGASTRODUODENOSCOPY N/A 12/22/2014   Procedure: ESOPHAGOGASTRODUODENOSCOPY (EGD);  Surgeon: [name], MD;  Location: [facility] ENDOSCOPY;  Service: Endoscopy;  Laterality: N/A;  . ESOPHAGOGASTRODUODENOSCOPY (EGD) WITH PROPOFOL N/A 10/04/2019   Procedure: ESOPHAGOGASTRODUODENOSCOPY (EGD) WITH PROPOFOL;  Surgeon: Lucilla Lame, MD;  Location: [facility] ENDOSCOPY;  Service: Endoscopy;  Laterality: N/A;  . HERNIA REPAIR  2015   left groin  . INTERCOSTAL NERVE BLOCK Left 11/09/2019   Procedure: Intercostal Nerve Block;  Surgeon: Melrose Nakayama, MD;  Location: [place];  Service: Thoracic;  Laterality: Left;  . LUMBAR FUSION    . RADIOACTIVE SEED IMPLANT N/A 04/22/2016   Procedure: RADIOACTIVE SEED IMPLANT/BRACHYTHERAPY IMPLANT;  Surgeon: Hollice Espy, MD;  Location: [facility] ORS;  Service: Urology;  Laterality: N/A;  . ROBOT ASSISTED INGUINAL HERNIA REPAIR Bilateral 07/06/2018   Procedure: ROBOT ASSISTED INGUINAL HERNIA REPAIR;  Surgeon: Jules Husbands, MD;  Location: [facility] ORS;  Service: General;  Laterality: Bilateral;  . TONSILLECTOMY    . UMBILICAL HERNIA REPAIR N/A 07/06/2018   Procedure: LAPAROSCOPIC ROBOT ASSISTED UMBILICAL HERNIA;  Surgeon: Jules Husbands, MD;  Location: [facility] ORS;  Service: General;  Laterality: N/A;  . XI ROBOTIC ASSISTED THORACOSCOPY- SEGMENTECTOMY Left 11/09/2019   Procedure: XI ROBOTIC ASSISTED THORACOSCOPY-WEDGE RESECTION LEFT LOWER LOBE;  Surgeon: Melrose Nakayama, MD;  Location: [place];  Service: Thoracic;  Laterality: Left;    Social History   Socioeconomic History  . Marital status: Divorced    Spouse name: Not on file  . Number of children: 4  . Years of education: Not on file  . Highest education  level: GED or equivalent  Occupational History  . Occupation: Aeronautical engineer    Comment: retired  Tobacco Use  . Smoking status: Current Every Day Smoker    Packs/day: 1.00    Years: 3.00    Pack years: 3.00    Types: Cigarettes    Start date: 07/28/1962  . Smokeless tobacco: Never Used  . Tobacco comment: started back in march. 1 pack a day.  Vaping Use  . Vaping Use: Never used  Substance and Sexual Activity  . Alcohol use: Yes    Alcohol/week: 1.0 standard drink    Types: 1 Cans of beer per week    Comment: 2-3 beers a day  . Drug use: Yes    Frequency: 7.0 times per week    Types: Marijuana    Comment: Endorsed heroin 06/2014, UDS also + benzos, opiates, THC, neg for cocaine at that time.  Marland Kitchen Sexual activity: Not Currently  Other Topics Concern  . Not on file  Social History Narrative   Lives at home    Has kids and grandkids   Smoker x 55 years 1 ppd as of 07/2019 he did quit x 2 years  Drinks 1 pt liq qd as of 07/2019    Social Determinants of Health   Financial Resource Strain: Low Risk   . Difficulty of Paying Living Expenses: Not very hard  Food Insecurity: No Food Insecurity  . Worried About Charity fundraiser in the Last Year: Never true  . Ran Out of Food in the Last Year: Never true  Transportation Needs: No Transportation Needs  . Lack of Transportation (Medical): No  . Lack of Transportation (Non-Medical): No  Physical Activity: Not on file  Stress: No Stress Concern Present  . Feeling of Stress : Not at all  Social Connections: Not on file  Intimate Partner Violence: Not At Risk  . Fear of Current or Ex-Partner: No  . Emotionally Abused: No  . Physically Abused: No  . Sexually Abused: No    Family History  Problem Relation Age of Onset  . Heart disease Father        Unclear details. Father passed in late 95's 2/2 cancer.  . Colon cancer Father   . Alcohol abuse Sister   . Drug abuse Sister   . Anxiety disorder Sister   . Depression  Sister   . COPD Brother   . Obesity Brother   . Kidney disease Neg Hx   . Prostate cancer Neg Hx      Current Outpatient Medications:  .  albuterol (PROVENTIL) (2.5 MG/3ML) 0.083% nebulizer solution, TAKE 3 MLS BY NEBULIZATION EVERY 4 (FOUR) HOURS AS NEEDED FOR WHEEZING OR SHORTNESS OF BREATH., Disp: 375 mL, Rfl: 3 .  albuterol (VENTOLIN HFA) 108 (90 Base) MCG/ACT inhaler, Inhale 1-2 puffs into the lungs every 6 (six) hours as needed for wheezing or shortness of breath. , Disp: , Rfl:  .  alprazolam (XANAX) 1 MG tablet, Take 1 tablet (1 mg total) by mouth as directed. 1/2 in am 1 pill qhs, Disp: , Rfl:  .  ARIPiprazole (ABILIFY) 10 MG tablet, Take 10 mg by mouth daily., Disp: , Rfl:  .  aspirin EC 81 MG tablet, Take 81 mg by mouth daily., Disp: , Rfl:  .  Budeson-Glycopyrrol-Formoterol (BREZTRI AEROSPHERE) 160-9-4.8 MCG/ACT AERO, Inhale 2 puffs into the lungs in the morning and at bedtime., Disp: 5.9 g, Rfl: 0 .  Budeson-Glycopyrrol-Formoterol (BREZTRI AEROSPHERE) 160-9-4.8 MCG/ACT AERO, Inhale 2 puffs into the lungs in the morning and at bedtime., Disp: 5.9 g, Rfl: 0 .  celecoxib (CELEBREX) 200 MG capsule, TAKE 1 CAPSULE (200 MG TOTAL) BY MOUTH DAILY AFTER BREAKFAST., Disp: 90 capsule, Rfl: 0 .  clopidogrel (PLAVIX) 75 MG tablet, Take 1 tablet (75 mg total) by mouth daily. Reported on 01/15/2016, Disp: 90 tablet, Rfl: 3 .  donepezil (ARICEPT) 5 MG tablet, Take 1 tablet (5 mg total) by mouth at bedtime., Disp: 90 tablet, Rfl: 3 .  FLUoxetine (PROZAC) 20 MG capsule, Take 60 mg by mouth daily., Disp: , Rfl: 1 .  furosemide (LASIX) 40 MG tablet, Take 1 tablet (40 mg total) by mouth daily., Disp: 5 tablet, Rfl: 0 .  memantine (NAMENDA) 5 MG tablet, Take 1 tablet (5 mg total) by mouth 2 (two) times daily. Taking qd, Disp: 180 tablet, Rfl: 3 .  metoprolol succinate (TOPROL-XL) 25 MG 24 hr tablet, Take 1 tablet (25 mg total) by mouth daily., Disp: 90 tablet, Rfl: 3 .  mirabegron ER (MYRBETRIQ) 50 MG  TB24 tablet, Take 1 tablet (50 mg total) by mouth daily., Disp: 90 tablet, Rfl: 3 .  Multiple Vitamins-Minerals (MULTIVITAMIN WITH MINERALS) tablet,  Take 1 tablet by mouth daily., Disp: , Rfl:  .  nitroGLYCERIN (NITROSTAT) 0.4 MG SL tablet, Place 1 tablet (0.4 mg total) under the tongue every 5 (five) minutes as needed for chest pain. Maximum of 3 doses, Disp: 25 tablet, Rfl: 3 .  ondansetron (ZOFRAN) 4 MG tablet, Take 1 tablet (4 mg total) by mouth every 8 (eight) hours as needed., Disp: 40 tablet, Rfl: 2 .  pantoprazole (PROTONIX) 40 MG tablet, Take 1 tablet (40 mg total) by mouth daily. 30 min before breakfast, Disp: 90 tablet, Rfl: 3 .  potassium chloride 20 MEQ TBCR, Take 20 mEq by mouth daily., Disp: 5 tablet, Rfl: 0 .  rosuvastatin (CRESTOR) 10 MG tablet, Take 1 tablet (10 mg total) by mouth at bedtime., Disp: 90 tablet, Rfl: 3 .  sucralfate (CARAFATE) 1 g tablet, Take 1 tablet (1 g total) by mouth 4 (four) times daily -  with meals and at bedtime. Stir in [employer] of liquid and let dissolve, Disp: 120 tablet, Rfl: 2 .  SYMBICORT 160-4.5 MCG/ACT inhaler, INHALE 2 PUFFS INTO THE LUNGS 2 (TWO) TIMES DAILY. RINSE MOUTH OUT, Disp: 30.6 each, Rfl: 4 .  tamsulosin (FLOMAX) 0.4 MG CAPS capsule, TAKE 1 CAPSULE (0.4 MG TOTAL) BY MOUTH DAILY AFTER SUPPER., Disp: 90 capsule, Rfl: 3  Physical exam:  Vitals:   11/20/20 1037  BP: 122/86  Pulse: 66  Resp: 20  Temp: (!) 96.5 F (35.8 C)  SpO2: 95%  Weight: 245 lb (111.1 kg)   Physical Exam Constitutional:      General: He is not in acute distress. Cardiovascular:     Rate and Rhythm: Normal rate and regular rhythm.     Heart sounds: Normal heart sounds.  Pulmonary:     Effort: Pulmonary effort is normal.     Breath sounds: Normal breath sounds.  Abdominal:     General: Bowel sounds are normal.     Palpations: Abdomen is soft.  Skin:    General: Skin is warm and dry.  Neurological:     Mental Status: He is alert and oriented to person, place,  and time.      CMP Latest Ref Rng & Units 11/20/2020  Glucose 70 - 99 mg/dL 113(H)  BUN 8 - 23 mg/dL 22  Creatinine 0.61 - 1.24 mg/dL 1.10  Sodium 135 - 145 mmol/L 138  Potassium 3.5 - 5.1 mmol/L 4.1  Chloride 98 - 111 mmol/L 104  CO2 22 - 32 mmol/L 24  Calcium 8.9 - 10.3 mg/dL 9.0  Total Protein 6.5 - 8.1 g/dL 7.4  Total Bilirubin 0.3 - 1.2 mg/dL 0.5  Alkaline Phos 38 - 126 U/L 73  AST 15 - 41 U/L 21  ALT 0 - 44 U/L 22   CBC Latest Ref Rng & Units 11/20/2020  WBC 4.0 - 10.5 K/uL 9.9  Hemoglobin 13.0 - 17.0 g/dL 12.6(L)  Hematocrit 39.0 - 52.0 % 36.6(L)  Platelets 150 - 400 K/uL 357      DG Chest 2 View  Result Date: 10/29/2020 CLINICAL DATA:  75 year old male with a history of COPD EXAM: CHEST - 2 VIEW COMPARISON:  02/14/2020, 05/17/2020 FINDINGS: Cardiomediastinal silhouette unchanged in size and contour. Linear opacities at the left greater than right lung bases, similar to the comparison. Opacity at the left lung base obscuring the left hemidiaphragm has improved though incompletely. Blunting at the costophrenic sulcus on the lateral view. No pneumothorax. No interlobular septal thickening. Degenerative changes of the spine.  No acute displaced  fracture IMPRESSION: Chronic lung changes with improved though persisting left-sided pleural effusion and associated scarring/atelectasis. Electronically Signed   By: Corrie Mckusick D.O.   On: 10/29/2020 12:16   CT Head Wo Contrast  Result Date: 11/06/2020 CLINICAL DATA:  Dizziness. EXAM: CT HEAD WITHOUT CONTRAST TECHNIQUE: Contiguous axial images were obtained from the base of the skull through the vertex without intravenous contrast. COMPARISON:  Head CT from 2015 and MRI brain from 2020 FINDINGS: Brain: Stable age related cerebral atrophy, ventriculomegaly and periventricular white matter disease. No extra-axial fluid collections are identified. No CT findings for acute hemispheric infarction or intracranial hemorrhage. No mass lesions.  The brainstem and cerebellum are normal. Vascular: Scattered vascular calcifications. No aneurysm or hyperdense vessels. Skull: No skull fracture or bone lesions. Sinuses/Orbits: The paranasal sinuses and mastoid air cells are clear. The globes are intact. Other: No scalp lesions or scalp hematoma. IMPRESSION: 1. Age related cerebral atrophy, ventriculomegaly and periventricular white matter disease. 2. No acute intracranial findings or mass lesions. Electronically Signed   By: Marijo Sanes M.D.   On: 11/06/2020 21:49   CT ABDOMEN PELVIS W CONTRAST  Result Date: 10/30/2020 CLINICAL DATA:  75 year old male with abdominal pain. EXAM: CT ABDOMEN AND PELVIS WITH CONTRAST TECHNIQUE: Multidetector CT imaging of the abdomen and pelvis was performed using the standard protocol following bolus administration of intravenous contrast. CONTRAST:  24mL OMNIPAQUE IOHEXOL 350 MG/ML SOLN COMPARISON:  CT of the abdomen pelvis dated 05/25/2018. chest CT dated 05/17/2020. FINDINGS: Lower chest: Partially visualized subpleural nodular densities involving the left lung base, present on the prior chest CT of 05/17/2020 likely postsurgical scarring. Right lung base atelectasis. There is coronary vascular calcification. No intra-abdominal free air or free fluid. Hepatobiliary: Mild fatty liver. Several subcentimeter hepatic hypodense lesions are too small to characterize. No intrahepatic biliary dilatation. The gallbladder is unremarkable. Pancreas: Unremarkable. No pancreatic ductal dilatation or surrounding inflammatory changes. Spleen: Normal in size without focal abnormality. Adrenals/Urinary Tract: The adrenal glands unremarkable. There is no hydronephrosis on either side. There is symmetric enhancement and excretion of contrast by both kidneys. Small bilateral renal cysts measuring up to 3 cm in the lower pole of the right kidney. Several additional subcentimeter hypodense lesions are too small to characterize. Mild bilateral  perinephric stranding, nonspecific. Correlation with urinalysis recommended to exclude UTI. The visualized ureters appear unremarkable. The urinary bladder is minimally distended. There is mild thickened appearance of the bladder wall which may be partly related to underdistention and partly secondary to chronic bladder outlet obstruction. Stomach/Bowel: There is severe distal colonic diverticulosis and scattered colonic diverticula without active inflammatory changes. There is no bowel obstruction or active inflammation. There is loose stool within the colon compatible with diarrheal state. The appendix is normal. Vascular/Lymphatic: Moderate aortoiliac atherosclerotic disease. The IVC is unremarkable. No portal venous gas. There is no adenopathy. Reproductive: Prostate brachytherapy seeds noted. The seminal vesicles are symmetric. Other: Left inguinal hernia repair. Musculoskeletal: Degenerative changes of the spine. No acute osseous pathology. Lower lumbar posterior fusion. IMPRESSION: 1. Diarrheal state. Correlation with clinical exam and stool cultures recommended. No bowel obstruction. Normal appendix. 2. Nonspecific perinephric stranding. Correlation with urinalysis recommended to exclude UTI. 3. Severe colonic diverticulosis. 4. Aortic Atherosclerosis (ICD10-I70.0). Electronically Signed   By: Anner Crete M.D.   On: 10/30/2020 17:43   Pulmonary Function Test [facility] Only  Result Date: 11/12/2020 Spirometry Data Is Acceptable and Reproducible Moderate Obstructive Airways Disease with Significant Broncho-Dilator Response +air trapping and +hyperinflation Consider outpatient Pulmonary Consultation  if needed Clinical Correlation Advised     Assessment and plan- Patient is a 75 y.o. male with stage IApT1b pN0 cM0 squamous cell carcinoma of the right lobe status post wedge resection.  He is here for routine follow-up of lung cancer  Patient was supposed to get a CT scan this month which has not been  done yet.  It will need to be reordered and I will get that done in the next 1 to 2 weeks.  I will call the patient with the results of CT scan.  I will tentatively see him back in 7 months with a scan prior.  Clinically patient is doing well with no concerning signs and symptoms of recurrence based on today's exam   Visit Diagnosis 1. Encounter for follow-up surveillance of lung cancer      Dr. Randa Evens, MD, MPH [facility] [phone] 11/20/2020 12:44 PM

## 2020-11-20 NOTE — Telephone Encounter (Signed)
Spoke with patient to make him aware that CT chest scan has been added to existing CT head scan on 11/26/20. Patient agreeable.

## 2020-11-20 NOTE — Addendum Note (Signed)
Addended by: Kern Alberta on: 11/20/2020 02:33 PM   Modules accepted: Orders

## 2020-11-21 ENCOUNTER — Ambulatory Visit: Payer: Medicare HMO | Admitting: Internal Medicine

## 2020-11-22 ENCOUNTER — Telehealth: Payer: Self-pay | Admitting: Internal Medicine

## 2020-11-22 NOTE — Addendum Note (Signed)
Addended by: Orland Mustard on: 11/22/2020 04:23 PM   Modules accepted: Orders

## 2020-11-22 NOTE — Telephone Encounter (Signed)
Why am I having to do this he has [facility]?  What is this order

## 2020-11-22 NOTE — Telephone Encounter (Signed)
Patient needs referral for counseling as we discussed.

## 2020-11-22 NOTE — Telephone Encounter (Signed)
[name] from [facility] called stating pt called their crisis line. Christopher Orr is requesting a referral for peer support/ outpatient, [facility]. Pt already sees them for med management. Please advise and Thank you!

## 2020-11-26 ENCOUNTER — Other Ambulatory Visit: Payer: Self-pay

## 2020-11-26 ENCOUNTER — Ambulatory Visit
Admission: RE | Admit: 2020-11-26 | Discharge: 2020-11-26 | Disposition: A | Discharge status: Home / Self Care | Payer: Medicare HMO | Source: Ambulatory Visit | Attending: Internal Medicine | Admitting: Internal Medicine

## 2020-11-26 DIAGNOSIS — Z85118 Personal history of other malignant neoplasm of bronchus and lung: Secondary | ICD-10-CM | POA: Diagnosis not present

## 2020-11-26 DIAGNOSIS — R42 Dizziness and giddiness: Secondary | ICD-10-CM | POA: Insufficient documentation

## 2020-11-26 DIAGNOSIS — J929 Pleural plaque without asbestos: Secondary | ICD-10-CM | POA: Diagnosis not present

## 2020-11-26 DIAGNOSIS — C3492 Malignant neoplasm of unspecified part of left bronchus or lung: Secondary | ICD-10-CM | POA: Diagnosis not present

## 2020-11-26 DIAGNOSIS — R112 Nausea with vomiting, unspecified: Secondary | ICD-10-CM | POA: Insufficient documentation

## 2020-11-26 DIAGNOSIS — C3490 Malignant neoplasm of unspecified part of unspecified bronchus or lung: Secondary | ICD-10-CM | POA: Diagnosis not present

## 2020-11-26 DIAGNOSIS — I2510 Atherosclerotic heart disease of native coronary artery without angina pectoris: Secondary | ICD-10-CM | POA: Diagnosis not present

## 2020-11-26 DIAGNOSIS — J984 Other disorders of lung: Secondary | ICD-10-CM | POA: Diagnosis not present

## 2020-11-27 ENCOUNTER — Telehealth: Payer: Self-pay | Admitting: Oncology

## 2020-11-27 ENCOUNTER — Telehealth: Payer: Self-pay | Admitting: *Deleted

## 2020-11-27 NOTE — Telephone Encounter (Signed)
Left VM with patient with date/time/instructions for CT scan on 06/13/21. Sending appointment reminder via mail.

## 2020-11-27 NOTE — Telephone Encounter (Signed)
-----   Message from Sindy Guadeloupe, MD sent at 11/27/2020  8:35 AM EDT ----- Please let patient know ct chest showed no recurrence. Needs repeat ct chest without contrast in 6 months prior to his appt with me

## 2020-11-27 NOTE — Telephone Encounter (Signed)
Called pt and let him know that his ct scna did not show any cancer recurrence. He will need another ct scan in 6 months and we will send him the date for scan and as well as md appt 1-2 days after the scan. The pt is ok with this. We will mail it to his home

## 2020-11-27 NOTE — Progress Notes (Signed)
CT scan scheduled. Left VM with patient and mailing reminder.

## 2020-11-28 DIAGNOSIS — F1020 Alcohol dependence, uncomplicated: Secondary | ICD-10-CM | POA: Diagnosis not present

## 2020-11-28 DIAGNOSIS — F411 Generalized anxiety disorder: Secondary | ICD-10-CM | POA: Diagnosis not present

## 2020-11-28 DIAGNOSIS — F341 Dysthymic disorder: Secondary | ICD-10-CM | POA: Diagnosis not present

## 2020-11-28 DIAGNOSIS — F331 Major depressive disorder, recurrent, moderate: Secondary | ICD-10-CM | POA: Diagnosis not present

## 2020-11-28 DIAGNOSIS — F4310 Post-traumatic stress disorder, unspecified: Secondary | ICD-10-CM | POA: Diagnosis not present

## 2020-12-05 ENCOUNTER — Other Ambulatory Visit: Payer: Self-pay | Admitting: Internal Medicine

## 2020-12-05 DIAGNOSIS — I2510 Atherosclerotic heart disease of native coronary artery without angina pectoris: Secondary | ICD-10-CM

## 2020-12-06 DIAGNOSIS — R1032 Left lower quadrant pain: Secondary | ICD-10-CM | POA: Diagnosis not present

## 2020-12-06 DIAGNOSIS — R109 Unspecified abdominal pain: Secondary | ICD-10-CM | POA: Diagnosis not present

## 2020-12-10 ENCOUNTER — Telehealth: Payer: Self-pay | Admitting: Internal Medicine

## 2020-12-10 DIAGNOSIS — F1411 Cocaine abuse, in remission: Secondary | ICD-10-CM | POA: Diagnosis not present

## 2020-12-10 DIAGNOSIS — J439 Emphysema, unspecified: Secondary | ICD-10-CM | POA: Diagnosis not present

## 2020-12-10 DIAGNOSIS — Z923 Personal history of irradiation: Secondary | ICD-10-CM | POA: Diagnosis not present

## 2020-12-10 DIAGNOSIS — Z7951 Long term (current) use of inhaled steroids: Secondary | ICD-10-CM | POA: Diagnosis not present

## 2020-12-10 DIAGNOSIS — F1611 Hallucinogen abuse, in remission: Secondary | ICD-10-CM | POA: Diagnosis not present

## 2020-12-10 DIAGNOSIS — F1220 Cannabis dependence, uncomplicated: Secondary | ICD-10-CM | POA: Diagnosis not present

## 2020-12-10 DIAGNOSIS — E785 Hyperlipidemia, unspecified: Secondary | ICD-10-CM | POA: Diagnosis not present

## 2020-12-10 DIAGNOSIS — D692 Other nonthrombocytopenic purpura: Secondary | ICD-10-CM | POA: Diagnosis not present

## 2020-12-10 DIAGNOSIS — F331 Major depressive disorder, recurrent, moderate: Secondary | ICD-10-CM | POA: Diagnosis not present

## 2020-12-10 DIAGNOSIS — I739 Peripheral vascular disease, unspecified: Secondary | ICD-10-CM | POA: Diagnosis not present

## 2020-12-10 DIAGNOSIS — I25118 Atherosclerotic heart disease of native coronary artery with other forms of angina pectoris: Secondary | ICD-10-CM | POA: Diagnosis not present

## 2020-12-10 DIAGNOSIS — Z7902 Long term (current) use of antithrombotics/antiplatelets: Secondary | ICD-10-CM | POA: Diagnosis not present

## 2020-12-10 DIAGNOSIS — F1021 Alcohol dependence, in remission: Secondary | ICD-10-CM | POA: Diagnosis not present

## 2020-12-10 DIAGNOSIS — I110 Hypertensive heart disease with heart failure: Secondary | ICD-10-CM | POA: Diagnosis not present

## 2020-12-10 DIAGNOSIS — Z902 Acquired absence of lung [part of]: Secondary | ICD-10-CM | POA: Diagnosis not present

## 2020-12-10 DIAGNOSIS — I509 Heart failure, unspecified: Secondary | ICD-10-CM | POA: Diagnosis not present

## 2020-12-10 DIAGNOSIS — E261 Secondary hyperaldosteronism: Secondary | ICD-10-CM | POA: Diagnosis not present

## 2020-12-10 DIAGNOSIS — Z7982 Long term (current) use of aspirin: Secondary | ICD-10-CM | POA: Diagnosis not present

## 2020-12-10 DIAGNOSIS — F0390 Unspecified dementia without behavioral disturbance: Secondary | ICD-10-CM | POA: Diagnosis not present

## 2020-12-10 DIAGNOSIS — Z85118 Personal history of other malignant neoplasm of bronchus and lung: Secondary | ICD-10-CM | POA: Diagnosis not present

## 2020-12-10 NOTE — Telephone Encounter (Signed)
Christopher Orr   [phone] form [facility] called in about patient 's B/P 88/58 little dizziness  No complaints wanted to know if Dr.Mclean wants to change his B/p medication

## 2020-12-10 NOTE — Telephone Encounter (Signed)
States that at her last visit with the Patient, BP was 110/60 2 months ago.   Recheck today was 90/62, asymptomatic for low BP symptoms such as dizziness. Patient has had dizziness in the past with low BP.   Given antibiotics a week ago for diverticulitis. No Diarrhea or vomiting today.

## 2020-12-10 NOTE — Telephone Encounter (Signed)
Please call and find out if the low blood pressure is a new issue or if this has been going on for a while.  Please see if they rechecked his blood pressure.  If they did not please see if the patient can have his blood pressure rechecked to see if it remains low.  Has he had other symptoms such as vomiting or diarrhea?

## 2020-12-10 NOTE — Telephone Encounter (Signed)
Sch appt this week  Make sure hydrates with water 55 ounces daily  Also lasix is he taking daily 40 mg ? Can cut in 1/2 so 20 mg and take as needed for leg swelling, abdomen swelling, weight gain Goal BP >90/>60  Also agree could cut metoprolol in 1/2 pill as prescribed frequency

## 2020-12-10 NOTE — Telephone Encounter (Signed)
Please advise 

## 2020-12-10 NOTE — Telephone Encounter (Signed)
Noted.  The only medicine he is on that could be altered would be the metoprolol.  They could try cutting the metoprolol pill in half and see if that helps with his blood pressure.  He needs follow-up with his PCP sometime this week if possible for his blood pressure.  If he develops lightheadedness, persistent blood pressure is less than 90/60, or any other new symptoms he needs to be evaluated in person right away.  Thanks.

## 2020-12-11 NOTE — Telephone Encounter (Signed)
Patient informed and verbalized understanding.  He will cut both medications in half. Patient scheduled to come in 12/12/20 at 1:30 pm.

## 2020-12-12 ENCOUNTER — Encounter: Payer: Self-pay | Admitting: Internal Medicine

## 2020-12-12 ENCOUNTER — Ambulatory Visit (INDEPENDENT_AMBULATORY_CARE_PROVIDER_SITE_OTHER): Payer: Medicare HMO | Admitting: Internal Medicine

## 2020-12-12 ENCOUNTER — Other Ambulatory Visit: Payer: Self-pay

## 2020-12-12 VITALS — Temp 97.5°F | Ht 72.0 in | Wt 245.2 lb

## 2020-12-12 DIAGNOSIS — K5792 Diverticulitis of intestine, part unspecified, without perforation or abscess without bleeding: Secondary | ICD-10-CM

## 2020-12-12 DIAGNOSIS — R42 Dizziness and giddiness: Secondary | ICD-10-CM

## 2020-12-12 DIAGNOSIS — I2510 Atherosclerotic heart disease of native coronary artery without angina pectoris: Secondary | ICD-10-CM

## 2020-12-12 DIAGNOSIS — R413 Other amnesia: Secondary | ICD-10-CM

## 2020-12-12 DIAGNOSIS — I959 Hypotension, unspecified: Secondary | ICD-10-CM

## 2020-12-12 DIAGNOSIS — I499 Cardiac arrhythmia, unspecified: Secondary | ICD-10-CM

## 2020-12-12 MED ORDER — METOPROLOL SUCCINATE ER 25 MG PO TB24: 12.5000 mg | ORAL_TABLET | Freq: Every day | ORAL | 3 refills | Status: DC

## 2020-12-12 NOTE — Patient Instructions (Addendum)
Stop lasix for now 40 mg Cut toprol/metoprolol 25 in 1/2 pill daily  Referred to Dr. Manuella Ghazi Neurology they will call for appt   Ordered ultrasound heart and neck  04/20/2020 Office Visit [facility]  [place], [place] [address]  [phone]  Ray Church, [place]  [facility]  [place], [place] [address]  [phone] (Work)  [phone] (Fax)

## 2020-12-12 NOTE — Progress Notes (Signed)
Chief Complaint  Patient presents with  . Follow-up   F/u  1. Hypotension on toprol xl 25 mg qd and lasix 20-40 mg qd prn no leg edeema orthostatics neg lying 94/60 hr 67, sitting 86/58 hr 70 dizzy and lightheaded blurry vision and standing 88/52 hr 58 orthostatics negative  Heart is irregular today as well  2.memory loss worse ? If taking aricept 5 mg qd ? If taking namenda 10 mg bid refer back to neurology and also dizziness  CT head 11/26/20 with brain shrinkage could be age/etoh related he stopped drinking since 10/2020 appt  3. Diverticulitis saw GI 12/06/20 given augmentin bid x 7 days and flagyl 500 tid x 7 days    Review of Systems  Constitutional: Negative for weight loss.       +down 20 lbs  HENT: Negative for hearing loss.   Eyes: Negative for blurred vision.  Respiratory: Negative for shortness of breath.   Cardiovascular: Negative for chest pain.  Gastrointestinal: Positive for diarrhea. Negative for abdominal pain.  Musculoskeletal: Positive for falls. Negative for joint pain.       Fall x 2   Skin: Negative for rash.  Neurological: Positive for dizziness.  Psychiatric/Behavioral: Positive for memory loss.   Past Medical History:  Diagnosis Date  . Alcohol abuse    pt states it is marijuana  . Anemia   . Arthritis   . Bipolar disorder ([place])   . BPH (benign prostatic hypertrophy) with urinary obstruction   . CAD (coronary artery disease)    a. s/p stent to LAD in 2006 at [facility], EF 60% at that time.  . Cancer [facility])    prostate  . CHF (congestive heart failure) ([place])   . Chronic pancreatitis ([place])   . Cirrhosis ([place])   . COPD (chronic obstructive pulmonary disease) ([place])   . COVID-19    08/22/20  . Dementia ([place])    early dementia  . Depression   . Diverticulitis    12/06/20 [facility]   . Diverticulosis   . Drug use   . Dyspnea   . ED (erectile dysfunction)   . Gastritis   . GERD (gastroesophageal reflux disease)   . Headache    occasional migraines  .  Hepatitis C    treated  . History of lumbar fusion   . Hyperlipidemia   . Hypertension    patient denies having high blood pressure  . Lung cancer ([place])   . Mitral regurgitation   . Pancreatitis    x 2   . Pneumonia    07/06/18   . PTSD (post-traumatic stress disorder)   . Rib fracture    s/p fall 03/15/19   . Sinus disease   . Urge incontinence    Past Surgical History:  Procedure Laterality Date  . CARDIAC CATHETERIZATION  2006  . cateract  2013  . COLONOSCOPY WITH PROPOFOL N/A 10/04/2019   Procedure: COLONOSCOPY WITH PROPOFOL;  Surgeon: Lucilla Lame, MD;  Location: [facility] END[facility]COPY;  Service: Endoscopy;  Laterality: N/A;  . CORONARY ANGIOPLASTY  2006  . CORONARY STENT PLACEMENT  2006   x 1  . ESOPHAGOGASTRODUODENOSCOPY N/A 12/22/2014   Procedure: ESOPHAGOGASTRODUODENOSCOPY (EGD);  Surgeon: [name], MD;  Location: [facility] ENDOSCOPY;  Service: Endoscopy;  Laterality: N/A;  . ESOPHAGOGASTRODUODENOSCOPY (EGD) WITH PROPOFOL N/A 10/04/2019   Procedure: ESOPHAGOGASTRODUODENOSCOPY (EGD) WITH PROPOFOL;  Surgeon: Lucilla Lame, MD;  Location: [facility] ENDOSCOPY;  Service: Endoscopy;  Laterality: N/A;  . HERNIA REPAIR  2015  left groin  . INTERCOSTAL NERVE BLOCK Left 11/09/2019   Procedure: Intercostal Nerve Block;  Surgeon: Melrose Nakayama, MD;  Location: [place];  Service: Thoracic;  Laterality: Left;  . LUMBAR FUSION    . RADIOACTIVE SEED IMPLANT N/A 04/22/2016   Procedure: RADIOACTIVE SEED IMPLANT/BRACHYTHERAPY IMPLANT;  Surgeon: Hollice Espy, MD;  Location: [facility] ORS;  Service: Urology;  Laterality: N/A;  . ROBOT ASSISTED INGUINAL HERNIA REPAIR Bilateral 07/06/2018   Procedure: ROBOT ASSISTED INGUINAL HERNIA REPAIR;  Surgeon: Jules Husbands, MD;  Location: [facility] ORS;  Service: General;  Laterality: Bilateral;  . TONSILLECTOMY    . UMBILICAL HERNIA REPAIR N/A 07/06/2018   Procedure: LAPAROSCOPIC ROBOT ASSISTED UMBILICAL HERNIA;  Surgeon: Jules Husbands, MD;  Location: [facility] ORS;   Service: General;  Laterality: N/A;  . XI ROBOTIC ASSISTED THORACOSCOPY- SEGMENTECTOMY Left 11/09/2019   Procedure: XI ROBOTIC ASSISTED THORACOSCOPY-WEDGE RESECTION LEFT LOWER LOBE;  Surgeon: Melrose Nakayama, MD;  Location: [facility] OR;  Service: Thoracic;  Laterality: Left;   Family History  Problem Relation Age of Onset  . Heart disease Father        Unclear details. Father passed in late 68's 2/2 cancer.  . Colon cancer Father   . Alcohol abuse Sister   . Drug abuse Sister   . Anxiety disorder Sister   . Depression Sister   . COPD Brother   . Obesity Brother   . Kidney disease Neg Hx   . Prostate cancer Neg Hx    Social History   Socioeconomic History  . Marital status: Divorced    Spouse name: Not on file  . Number of children: 4  . Years of education: Not on file  . Highest education level: GED or equivalent  Occupational History  . Occupation: Aeronautical engineer    Comment: retired  Tobacco Use  . Smoking status: Current Every Day Smoker    Packs/day: 1.00    Years: 3.00    Pack years: 3.00    Types: Cigarettes    Start date: 07/28/1962  . Smokeless tobacco: Never Used  . Tobacco comment: started back in march. 1 pack a day.  Vaping Use  . Vaping Use: Never used  Substance and Sexual Activity  . Alcohol use: Yes    Alcohol/week: 1.0 standard drink    Types: 1 Cans of beer per week    Comment: 2-3 beers a day  . Drug use: Yes    Frequency: 7.0 times per week    Types: Marijuana    Comment: Endorsed heroin 06/2014, UDS also + benzos, opiates, THC, neg for cocaine at that time.  Marland Kitchen Sexual activity: Not Currently  Other Topics Concern  . Not on file  Social History Narrative   Lives at home    Has kids and grandkids   Smoker x 55 years 1 ppd as of 07/2019 he did quit x 2 years    Drinks 1 pt liq qd as of 07/2019    Social Determinants of Health   Financial Resource Strain: Low Risk   . Difficulty of Paying Living Expenses: Not very hard  Food Insecurity: No  Food Insecurity  . Worried About Charity fundraiser in the Last Year: Never true  . Ran Out of Food in the Last Year: Never true  Transportation Needs: No Transportation Needs  . Lack of Transportation (Medical): No  . Lack of Transportation (Non-Medical): No  Physical Activity: Not on file  Stress: No Stress Concern Present  .  Feeling of Stress : Not at all  Social Connections: Not on file  Intimate Partner Violence: Not At Risk  . Fear of Current or Ex-Partner: No  . Emotionally Abused: No  . Physically Abused: No  . Sexually Abused: No   Current Meds  Medication Sig  . albuterol (PROVENTIL) (2.5 MG/3ML) 0.083% nebulizer solution TAKE 3 MLS BY NEBULIZATION EVERY 4 (FOUR) HOURS AS NEEDED FOR WHEEZING OR SHORTNESS OF BREATH.  Marland Kitchen albuterol (VENTOLIN HFA) 108 (90 Base) MCG/ACT inhaler Inhale 1-2 puffs into the lungs every 6 (six) hours as needed for wheezing or shortness of breath.   . alprazolam (XANAX) 1 MG tablet Take 1 tablet (1 mg total) by mouth as directed. 1/2 in am 1 pill qhs  . amoxicillin-clavulanate (AUGMENTIN) 875-125 MG tablet Take 1 tablet by mouth.  . ARIPiprazole (ABILIFY) 10 MG tablet Take 10 mg by mouth daily.  Marland Kitchen aspirin EC 81 MG tablet Take 81 mg by mouth daily.  . Budeson-Glycopyrrol-Formoterol (BREZTRI AEROSPHERE) 160-9-4.8 MCG/ACT AERO Inhale 2 puffs into the lungs in the morning and at bedtime.  . Budeson-Glycopyrrol-Formoterol (BREZTRI AEROSPHERE) 160-9-4.8 MCG/ACT AERO Inhale 2 puffs into the lungs in the morning and at bedtime.  . celecoxib (CELEBREX) 200 MG capsule TAKE 1 CAPSULE (200 MG TOTAL) BY MOUTH DAILY AFTER BREAKFAST.  Marland Kitchen clopidogrel (PLAVIX) 75 MG tablet TAKE 1 TABLET (75 MG TOTAL) BY MOUTH DAILY.  Marland Kitchen donepezil (ARICEPT) 5 MG tablet Take 1 tablet (5 mg total) by mouth at bedtime.  Marland Kitchen FLUoxetine (PROZAC) 20 MG capsule Take 60 mg by mouth daily.  . memantine (NAMENDA) 5 MG tablet Take 1 tablet (5 mg total) by mouth 2 (two) times daily. Taking qd  .  mirabegron ER (MYRBETRIQ) 50 MG TB24 tablet Take 1 tablet (50 mg total) by mouth daily.  . Multiple Vitamins-Minerals (MULTIVITAMIN WITH MINERALS) tablet Take 1 tablet by mouth daily.  . nitroGLYCERIN (NITROSTAT) 0.4 MG SL tablet Place 1 tablet (0.4 mg total) under the tongue every 5 (five) minutes as needed for chest pain. Maximum of 3 doses  . ondansetron (ZOFRAN) 4 MG tablet Take 1 tablet (4 mg total) by mouth every 8 (eight) hours as needed.  . pantoprazole (PROTONIX) 40 MG tablet Take 1 tablet (40 mg total) by mouth daily. 30 min before breakfast  . potassium chloride 20 MEQ TBCR Take 20 mEq by mouth daily.  . rosuvastatin (CRESTOR) 10 MG tablet Take 1 tablet (10 mg total) by mouth at bedtime.  . sucralfate (CARAFATE) 1 g tablet Take 1 tablet (1 g total) by mouth 4 (four) times daily -  with meals and at bedtime. Stir in [employer] of liquid and let dissolve  . SYMBICORT 160-4.5 MCG/ACT inhaler INHALE 2 PUFFS INTO THE LUNGS 2 (TWO) TIMES DAILY. RINSE MOUTH OUT  . tamsulosin (FLOMAX) 0.4 MG CAPS capsule TAKE 1 CAPSULE (0.4 MG TOTAL) BY MOUTH DAILY AFTER SUPPER.  . [DISCONTINUED] furosemide (LASIX) 40 MG tablet Take 1 tablet (40 mg total) by mouth daily.  . [DISCONTINUED] metoprolol succinate (TOPROL-XL) 25 MG 24 hr tablet Take 1 tablet (25 mg total) by mouth daily.   No Known Allergies Recent Results (from the past 2160 hour(s))  Basic metabolic panel     Status: Abnormal   Collection Time: 10/30/20  3:03 PM  Result Value Ref Range   Sodium 136 135 - 145 mmol/L   Potassium 4.2 3.5 - 5.1 mmol/L   Chloride 104 98 - 111 mmol/L   CO2 22 22 - 32  mmol/L   Glucose, Bld 103 (H) 70 - 99 mg/dL    Comment: Glucose reference range applies only to samples taken after fasting for at least 8 hours.   BUN 15 8 - 23 mg/dL   Creatinine, Ser 1.14 0.61 - 1.24 mg/dL   Calcium 9.2 8.9 - 10.3 mg/dL   GFR, Estimated >60 >60 mL/min    Comment: (NOTE) Calculated using the CKD-EPI Creatinine Equation (2021)     Anion gap 10 5 - 15    Comment: Performed at [facility], [address] [place], [place] [address]  CBC     Status: Abnormal   Collection Time: 10/30/20  3:03 PM  Result Value Ref Range   WBC 11.8 (H) 4.0 - 10.5 K/uL   RBC 3.86 (L) 4.22 - 5.81 MIL/uL   Hemoglobin 12.9 (L) 13.0 - 17.0 g/dL   HCT 37.3 (L) 39.0 - 52.0 %   MCV 96.6 80.0 - 100.0 fL   MCH 33.4 26.0 - 34.0 pg   MCHC 34.6 30.0 - 36.0 g/dL   RDW 14.6 11.5 - 15.5 %   Platelets 301 150 - 400 K/uL   nRBC 0.0 0.0 - 0.2 %    Comment: Performed at [facility], [address] [place], [place] [address]  Lipase, blood     Status: None   Collection Time: 10/30/20  3:03 PM  Result Value Ref Range   Lipase 33 11 - 51 U/L    Comment: Performed at [facility], [address] [place], [place] [address]  Hepatic function panel     Status: None   Collection Time: 10/30/20  3:03 PM  Result Value Ref Range   Total Protein 7.3 6.5 - 8.1 g/dL   Albumin 4.1 3.5 - 5.0 g/dL   AST 24 15 - 41 U/L   ALT 20 0 - 44 U/L   Alkaline Phosphatase 79 38 - 126 U/L   Total Bilirubin 0.9 0.3 - 1.2 mg/dL   Bilirubin, Direct 0.2 0.0 - 0.2 mg/dL   Indirect Bilirubin 0.7 0.3 - 0.9 mg/dL    Comment: Performed at [facility], [address] [place], [place] [address]  Magnesium     Status: None   Collection Time: 10/30/20  3:03 PM  Result Value Ref Range   Magnesium 2.0 1.7 - 2.4 mg/dL    Comment: Performed at [facility], [address] [place], [place] [address]  Urinalysis, Complete w Microscopic     Status: Abnormal   Collection Time: 10/30/20  5:02 PM  Result Value Ref Range   Color, Urine AMBER (A) YELLOW    Comment: BIOCHEMICALS MAY BE AFFECTED BY COLOR   APPearance HAZY (A) CLEAR   Specific Gravity, Urine 1.019 1.005 - 1.030   pH 5.0 5.0 - 8.0   Glucose, UA NEGATIVE NEGATIVE mg/dL   Hgb urine dipstick NEGATIVE NEGATIVE   Bilirubin Urine NEGATIVE NEGATIVE   Ketones, ur 20 (A) NEGATIVE  mg/dL   Protein, ur 30 (A) NEGATIVE mg/dL   Nitrite NEGATIVE NEGATIVE   Leukocytes,Ua NEGATIVE NEGATIVE   RBC / HPF 0-5 0 - 5 RBC/hpf   WBC, UA 0-5 0 - 5 WBC/hpf   Bacteria, UA NONE SEEN NONE SEEN   Squamous Epithelial / LPF 0-5 0 - 5   Mucus PRESENT    Hyaline Casts, UA PRESENT     Comment: Performed at [facility], [address] [place],  [address]  Urine Culture  Status: None   Collection Time: 11/01/20  3:30 PM   Specimen: Urine   Urine  Result Value Ref Range   Urine Culture, Routine Final report    Organism ID, Bacteria No growth   Gastrointestinal Panel by PCR , Stool     Status: None   Collection Time: 11/02/20 10:30 AM   Specimen: STOOL  Result Value Ref Range   Campylobacter species NOT DETECTED NOT DETECTED   Plesimonas shigelloides NOT DETECTED NOT DETECTED   Salmonella species NOT DETECTED NOT DETECTED   Yersinia enterocolitica NOT DETECTED NOT DETECTED   Vibrio species NOT DETECTED NOT DETECTED   Vibrio cholerae NOT DETECTED NOT DETECTED   Enteroaggregative E coli (EAEC) NOT DETECTED NOT DETECTED   Enteropathogenic E coli (EPEC) NOT DETECTED NOT DETECTED   Enterotoxigenic E coli (ETEC) NOT DETECTED NOT DETECTED   Shiga like toxin producing E coli (STEC) NOT DETECTED NOT DETECTED   Shigella/Enteroinvasive E coli (EIEC) NOT DETECTED NOT DETECTED   Cryptosporidium NOT DETECTED NOT DETECTED   Cyclospora cayetanensis NOT DETECTED NOT DETECTED   Entamoeba histolytica NOT DETECTED NOT DETECTED   Giardia lamblia NOT DETECTED NOT DETECTED   Adenovirus F40/41 NOT DETECTED NOT DETECTED   Astrovirus NOT DETECTED NOT DETECTED   Norovirus GI/GII NOT DETECTED NOT DETECTED   Rotavirus A NOT DETECTED NOT DETECTED   Sapovirus (I, II, IV, and V) NOT DETECTED NOT DETECTED    Comment: Performed at [facility], [address] [place], [place] [address]  C Difficile Quick Screen w PCR reflex     Status: None   Collection Time: 11/02/20 10:30 AM    Specimen: STOOL  Result Value Ref Range   C Diff antigen NEGATIVE NEGATIVE   C Diff toxin NEGATIVE NEGATIVE   C Diff interpretation No C. difficile detected.     Comment: Performed at [facility], [place], [place] [address]  SARS CORONAVIRUS 2 (TAT 6-24 HRS) Nasopharyngeal Nasopharyngeal Swab     Status: None   Collection Time: 11/02/20 10:45 AM   Specimen: Nasopharyngeal Swab  Result Value Ref Range   SARS Coronavirus 2 NEGATIVE NEGATIVE    Comment: (NOTE) SARS-CoV-2 target nucleic acids are NOT DETECTED.  The SARS-CoV-2 RNA is generally detectable in upper and lower respiratory specimens during the acute phase of infection. Negative results do not preclude SARS-CoV-2 infection, do not rule out co-infections with other pathogens, and should not be used as the sole basis for treatment or other patient management decisions. Negative results must be combined with clinical observations, patient history, and epidemiological information. The expected result is Negative.  Fact Sheet for Patients: SugarRoll.be  Fact Sheet for Healthcare Providers: https://www.woods-mathews.com/  This test is not yet approved or cleared by the [place] FDA and  has been authorized for detection and/or diagnosis of SARS-CoV-2 by FDA under an Emergency Use Authorization (EUA). This EUA will remain  in effect (meaning this test can be used) for the duration of the COVID-19 declaration under Se ction 564(b)(1) of the Act, 21 U.S.C. section 360bbb-3(b)(1), unless the authorization is terminated or revoked sooner.  Performed at [facility], [place] [address] [place], [place] [address]   Comprehensive metabolic panel     Status: None   Collection Time: 11/06/20  5:06 PM  Result Value Ref Range   Sodium 137 135 - 145 mmol/L   Potassium 4.2 3.5 - 5.1 mmol/L   Chloride 103 98 - 111 mmol/L   CO2 26  22 - 32 mmol/L   Glucose, Bld  82 70 - 99 mg/dL    Comment: Glucose reference range applies only to samples taken after fasting for at least 8 hours.   BUN 9 8 - 23 mg/dL   Creatinine, Ser 1.11 0.61 - 1.24 mg/dL   Calcium 9.2 8.9 - 10.3 mg/dL   Total Protein 7.3 6.5 - 8.1 g/dL   Albumin 3.7 3.5 - 5.0 g/dL   AST 23 15 - 41 U/L   ALT 21 0 - 44 U/L   Alkaline Phosphatase 78 38 - 126 U/L   Total Bilirubin 0.9 0.3 - 1.2 mg/dL   GFR, Estimated >60 >60 mL/min    Comment: (NOTE) Calculated using the CKD-EPI Creatinine Equation (2021)    Anion gap 8 5 - 15    Comment: Performed at [place] [address], [place], [place] [address]  Ethanol     Status: None   Collection Time: 11/06/20  5:06 PM  Result Value Ref Range   Alcohol, Ethyl (B) <10 <10 mg/dL    Comment: (NOTE) Lowest detectable limit for serum alcohol is 10 mg/dL.  For medical purposes only. Performed at [facility], [place] [address], [place], [place] [address]   cbc     Status: Abnormal   Collection Time: 11/06/20  5:06 PM  Result Value Ref Range   WBC 13.6 (H) 4.0 - 10.5 K/uL   RBC 3.76 (L) 4.22 - 5.81 MIL/uL   Hemoglobin 12.3 (L) 13.0 - 17.0 g/dL   HCT 37.3 (L) 39.0 - 52.0 %   MCV 99.2 80.0 - 100.0 fL   MCH 32.7 26.0 - 34.0 pg   MCHC 33.0 30.0 - 36.0 g/dL   RDW 14.3 11.5 - 15.5 %   Platelets 253 150 - 400 K/uL   nRBC 0.0 0.0 - 0.2 %    Comment: Performed at [facility], [place] [address] [place], [place] [address]  CK     Status: None   Collection Time: 11/06/20  5:06 PM  Result Value Ref Range   Total CK 195 49 - 397 U/L    Comment: Performed at [facility], [place] [address], [place], [place] [address]  Comprehensive metabolic panel     Status: Abnormal   Collection Time: 11/20/20  9:58 AM  Result Value Ref Range   Sodium 138 135 - 145 mmol/L   Potassium 4.1 3.5 - 5.1 mmol/L   Chloride 104 98 - 111 mmol/L   CO2 24 22 - 32 mmol/L   Glucose, Bld 113 (H) 70 - 99 mg/dL    Comment: Glucose reference range applies  only to samples taken after fasting for at least 8 hours.   BUN 22 8 - 23 mg/dL   Creatinine, Ser 1.10 0.61 - 1.24 mg/dL   Calcium 9.0 8.9 - 10.3 mg/dL   Total Protein 7.4 6.5 - 8.1 g/dL   Albumin 3.8 3.5 - 5.0 g/dL   AST 21 15 - 41 U/L   ALT 22 0 - 44 U/L   Alkaline Phosphatase 73 38 - 126 U/L   Total Bilirubin 0.5 0.3 - 1.2 mg/dL   GFR, Estimated >60 >60 mL/min    Comment: (NOTE) Calculated using the CKD-EPI Creatinine Equation (2021)    Anion gap 10 5 - 15    Comment: Performed at [facility], [address], [place], [place] [address]  CBC with Differential     Status: Abnormal  Collection Time: 11/20/20  9:58 AM  Result Value Ref Range   WBC 9.9 4.0 - 10.5 K/uL   RBC 3.79 (L) 4.22 - 5.81 MIL/uL   Hemoglobin 12.6 (L) 13.0 - 17.0 g/dL   HCT 36.6 (L) 39.0 - 52.0 %   MCV 96.6 80.0 - 100.0 fL   MCH 33.2 26.0 - 34.0 pg   MCHC 34.4 30.0 - 36.0 g/dL   RDW 14.1 11.5 - 15.5 %   Platelets 357 150 - 400 K/uL   nRBC 0.0 0.0 - 0.2 %   Neutrophils Relative % 68 %   Neutro Abs 6.8 1.7 - 7.7 K/uL   Lymphocytes Relative 19 %   Lymphs Abs 1.9 0.7 - 4.0 K/uL   Monocytes Relative 9 %   Monocytes Absolute 0.9 0.1 - 1.0 K/uL   Eosinophils Relative 3 %   Eosinophils Absolute 0.3 0.0 - 0.5 K/uL   Basophils Relative 1 %   Basophils Absolute 0.1 0.0 - 0.1 K/uL   Immature Granulocytes 0 %   Abs Immature Granulocytes 0.04 0.00 - 0.07 K/uL    Comment: Performed at [facility], [address] [place], [place] [address]   Objective  Body mass index is 33.26 kg/m. Wt Readings from Last 3 Encounters:  12/12/20 245 lb 3.2 oz (111.2 kg)  11/20/20 245 lb (111.1 kg)  11/06/20 265 lb (120.2 kg)   Temp Readings from Last 3 Encounters:  12/12/20 (!) 97.5 F (36.4 C) (Oral)  11/20/20 (!) 96.5 F (35.8 C)  11/06/20 98.1 F (36.7 C) (Oral)   BP Readings from Last 3 Encounters:  11/20/20 122/86  11/06/20 138/84  11/01/20 120/70   Pulse Readings from Last 3 Encounters:   11/20/20 66  11/06/20 75  11/01/20 97    Physical Exam Vitals and nursing note reviewed.  Constitutional:      Appearance: Normal appearance. He is well-developed and well-groomed.  HENT:     Head: Normocephalic and atraumatic.  Eyes:     Conjunctiva/sclera: Conjunctivae normal.     Pupils: Pupils are equal, round, and reactive to light.  Cardiovascular:     Rate and Rhythm: Normal rate and regular rhythm.     Heart sounds: Normal heart sounds. No murmur heard.   Pulmonary:     Effort: Pulmonary effort is normal.     Breath sounds: Normal breath sounds.  Skin:    General: Skin is warm and dry.  Neurological:     General: No focal deficit present.     Mental Status: He is alert and oriented to person, place, and time. Mental status is at baseline.     Gait: Gait normal.  Psychiatric:        Attention and Perception: Attention and perception normal.        Mood and Affect: Mood and affect normal.        Speech: Speech normal.        Behavior: Behavior normal. Behavior is cooperative.        Thought Content: Thought content normal.        Cognition and Memory: Cognition and memory normal.        Judgment: Judgment normal.     Assessment  Plan  Hypotension, unspecified hypotension type - Plan: EKG 12-Lead NSR LAD, ECHOCARDIOGRAM COMPLETE F/u with cardiology  Stop lasix 40 mg qd   Irregular heart beat - Plan: EKG 12-Lead, US Carotid Duplex Bilateral, ECHOCARDIOGRAM COMPLETE  Coronary artery disease involving native coronary artery of native  heart without angina pectoris - Plan: US Carotid Duplex Bilateral, ECHOCARDIOGRAM COMPLETE succinate (TOPROL-XL) 12.5 MG 24 hr tablet cut dose in 1/2  Memory loss - Plan: Ambulatory referral to Neurology ? Compliance with meds Dizziness - Plan: Ambulatory referral to Neurology, US Carotid Duplex Bilateral, ECHOCARDIOGRAM COMPLETE  Diverticulitis  Completing since 12/06/20 7 days course augmentin/flagyl    HM  Flu shot utd   prevnar utd -->pna 23due in 1 year9/18/21 Tdapconsider future shingrixconsider future covid vx per pt had3/3pfizer  COPD + Smoker 1 ppd x 55 years as of 07/2019 and had quit x 2 years in the past CT chest sch 05/17/20 s/p LLL resection 4/14/21and CT chest repeat 11/16/20  H/o hep C tx'ed Harvoni last 11/13/15 not detected quant will recheck with hep A immune 09/21/14/hep B >1000 09/21/14 and sAg negative  Alcohol abuse drinks 1 pt liquor qd as of 11/01/20 cute back   H/o prostate cancers/p brachytx with seedsPSA 05/18/19 0.22 f/u Dr.Brandon urology 10/2019 and Dr. Massie Maroon rad/onc PSA 05/23/20 <0.01  F/u urology 5/22  Skin-normal exam except for bruisingand reason for visit -seen [place] skin 2021or Dr. Phillip Heal   EGD/colonoscopy 10/04/19 Colonoscopy3/9/21 tubular and hyperplastic Dr. Allen Norris f/u in 5 years   Neurology Dr. Manuella Ghazi Cards Dr. Fletcher Anon  [facility] psych pulm Dr. Patsey Berthold   Provider: Dr. Olivia Mackie McLean-Scocuzza-Internal Medicine

## 2020-12-13 DIAGNOSIS — F13230 Sedative, hypnotic or anxiolytic dependence with withdrawal, uncomplicated: Secondary | ICD-10-CM | POA: Diagnosis not present

## 2020-12-13 DIAGNOSIS — F410 Panic disorder [episodic paroxysmal anxiety] without agoraphobia: Secondary | ICD-10-CM | POA: Diagnosis not present

## 2020-12-13 DIAGNOSIS — F1220 Cannabis dependence, uncomplicated: Secondary | ICD-10-CM | POA: Diagnosis not present

## 2020-12-13 DIAGNOSIS — F1021 Alcohol dependence, in remission: Secondary | ICD-10-CM | POA: Diagnosis not present

## 2020-12-13 DIAGNOSIS — F411 Generalized anxiety disorder: Secondary | ICD-10-CM | POA: Diagnosis not present

## 2020-12-13 DIAGNOSIS — F515 Nightmare disorder: Secondary | ICD-10-CM | POA: Diagnosis not present

## 2020-12-13 DIAGNOSIS — F0390 Unspecified dementia without behavioral disturbance: Secondary | ICD-10-CM | POA: Diagnosis not present

## 2020-12-13 DIAGNOSIS — F331 Major depressive disorder, recurrent, moderate: Secondary | ICD-10-CM | POA: Diagnosis not present

## 2020-12-13 DIAGNOSIS — F4310 Post-traumatic stress disorder, unspecified: Secondary | ICD-10-CM | POA: Diagnosis not present

## 2020-12-14 ENCOUNTER — Telehealth: Payer: Self-pay

## 2020-12-14 DIAGNOSIS — I25119 Atherosclerotic heart disease of native coronary artery with unspecified angina pectoris: Secondary | ICD-10-CM

## 2020-12-14 NOTE — Telephone Encounter (Signed)
-----   Message from Wellington Hampshire, MD sent at 12/14/2020  9:23 AM EDT ----- Christopher Orr,  Please schedule the echo and follow up after. Thanks.    ----- Message ----- From: McLean-Scocuzza, [name], MD Sent: 12/12/2020   2:25 PM EDT To: Wellington Hampshire, MD  Pt hypotensive today ekg nsr lad but BP see note  Ordering echo Stopped lasix 40 and reduced toprol 25 xl to 12.5 can you all contact for appt thanks

## 2020-12-14 NOTE — Telephone Encounter (Signed)
Order placed for echo. Msg fwd to scheduling to call the patient to schedule the echo and f/u appt after the test.

## 2020-12-14 NOTE — Telephone Encounter (Signed)
Scheduled

## 2020-12-18 ENCOUNTER — Other Ambulatory Visit: Payer: Self-pay

## 2020-12-18 ENCOUNTER — Ambulatory Visit (INDEPENDENT_AMBULATORY_CARE_PROVIDER_SITE_OTHER): Payer: Medicare HMO

## 2020-12-18 DIAGNOSIS — I499 Cardiac arrhythmia, unspecified: Secondary | ICD-10-CM | POA: Diagnosis not present

## 2020-12-18 DIAGNOSIS — R42 Dizziness and giddiness: Secondary | ICD-10-CM | POA: Diagnosis not present

## 2020-12-18 DIAGNOSIS — I2510 Atherosclerotic heart disease of native coronary artery without angina pectoris: Secondary | ICD-10-CM

## 2020-12-18 DIAGNOSIS — I959 Hypotension, unspecified: Secondary | ICD-10-CM

## 2020-12-18 LAB — ECHOCARDIOGRAM COMPLETE
AR max vel: 4.55 cm2
AV Area VTI: 4.23 cm2
AV Area mean vel: 4.23 cm2
AV Mean grad: 3 mmHg
AV Peak grad: 4.6 mmHg
Ao pk vel: 1.07 m/s
Area-P 1/2: 2.23 cm2
Calc EF: 58.6 %
S' Lateral: 3.4 cm
Single Plane A2C EF: 54.9 %
Single Plane A4C EF: 61.5 %

## 2020-12-20 ENCOUNTER — Other Ambulatory Visit: Payer: Self-pay | Admitting: *Deleted

## 2020-12-20 DIAGNOSIS — N281 Cyst of kidney, acquired: Secondary | ICD-10-CM

## 2020-12-21 ENCOUNTER — Other Ambulatory Visit: Payer: Self-pay

## 2020-12-21 ENCOUNTER — Ambulatory Visit: Payer: Medicare HMO | Admitting: Cardiovascular Disease

## 2020-12-21 ENCOUNTER — Encounter: Payer: Self-pay | Admitting: Cardiovascular Disease

## 2020-12-21 ENCOUNTER — Other Ambulatory Visit: Payer: Medicare HMO

## 2020-12-21 VITALS — BP 110/70 | HR 58 | Ht 72.0 in | Wt 243.1 lb

## 2020-12-21 DIAGNOSIS — E785 Hyperlipidemia, unspecified: Secondary | ICD-10-CM

## 2020-12-21 DIAGNOSIS — I2510 Atherosclerotic heart disease of native coronary artery without angina pectoris: Secondary | ICD-10-CM

## 2020-12-21 DIAGNOSIS — C61 Malignant neoplasm of prostate: Secondary | ICD-10-CM | POA: Diagnosis not present

## 2020-12-21 DIAGNOSIS — J449 Chronic obstructive pulmonary disease, unspecified: Secondary | ICD-10-CM

## 2020-12-21 DIAGNOSIS — Z720 Tobacco use: Secondary | ICD-10-CM | POA: Diagnosis not present

## 2020-12-21 NOTE — Patient Instructions (Signed)
Medication Instructions:  Your physician has recommended you make the following change in your medication:   STOP Metoprolol.  *If you need a refill on your cardiac medications before your next appointment, please call your pharmacy*   Lab Work: None ordered If you have labs (blood work) drawn today and your tests are completely normal, you will receive your results only by: Marland Kitchen MyChart Message (if you have MyChart) OR . A paper copy in the mail If you have any lab test that is abnormal or we need to change your treatment, we will call you to review the results.   Testing/Procedures: None ordered   Follow-Up: At [facility], you and your health needs are our priority.  As part of our continuing mission to provide you with exceptional heart care, we have created designated Provider Care Teams.  These Care Teams include your primary Cardiologist (physician) and Advanced Practice Providers (APPs -  Physician Assistants and Nurse Practitioners) who all work together to provide you with the care you need, when you need it.  We recommend signing up for the patient portal called "MyChart".  Sign up information is provided on this After Visit Summary.  MyChart is used to connect with patients for Virtual Visits (Telemedicine).  Patients are able to view lab/test results, encounter notes, upcoming appointments, etc.  Non-urgent messages can be sent to your provider as well.   To learn more about what you can do with MyChart, go to [email or web address].    Your next appointment:   Your physician wants you to follow-up in: 6 months You will receive a reminder letter in the mail two months in advance. If you don't receive a letter, please call our office to schedule the follow-up appointment.   The format for your next appointment:   In Person  Provider:   You may see Kathlyn Sacramento, MD or one of the following Advanced Practice Providers on your designated Care Team:    Murray Hodgkins,  NP  Christell Faith, PA-C  Marrianne Mood, PA-C  Cadence [place], [place]  Laurann [place], NP    Other Instructions N/A

## 2020-12-21 NOTE — Progress Notes (Signed)
Cardiology Office Note   Date:  12/21/2020   ID:  Christopher Orr, DOB 04-03-1946, MRN [id number]  PCP:  McLean-Scocuzza, [name], MD  Cardiologist:   Kathlyn Sacramento, MD   Chief Complaint  Patient presents with  . Follow up Echo     Patient c/o dizziness, decreased blood pressures, shortness of breath with little exertion. Medications reviewed by the patient verbally.       History of Present Illness: Christopher Orr is a 75 y.o. male who is here today for follow-up visit regarding coronary artery disease.  He has known history of coronary artery disease status post LAD stenting 2006 with Cypher drug-eluting stent. He has multiple medical problems including prolonged history of tobacco use, COPD, hyperlipidemia, obesity, previous excessive alcohol use and hepatitis C which was treated.  He also has mild dementia. No cardiac events since his stent placement in 2006.  He had a repeat cardiac catheterization in 2011 which showed patent stent with mild restenosis. He continues to smoke 1 pack/day.  He had a CT scan of the abdomen in 2019 that showed no evidence of aortic aneurysm.  He was diagnosed with squamous cell cancer of the lung in April of 2021 and underwent robotic assisted wedge resection of the left lower lobe.  He did not require radiation or chemotherapy.    He was seen recently by his primary care physician and was noted to be mildly hypotensive and dizzy.  Thus, Toprol was decreased in half and furosemide was discontinued.  An echocardiogram was done which showed an EF of 50 to 40%, grade 1 diastolic dysfunction and no significant valvular abnormalities.  The patient feels better with less dizziness.  No chest pain.  He has chronic exertional dyspnea.  Past Medical History:  Diagnosis Date  . Alcohol abuse    pt states it is marijuana  . Anemia   . Arthritis   . Bipolar disorder ([place])   . BPH (benign prostatic hypertrophy) with urinary obstruction   . CAD (coronary  artery disease)    a. s/p stent to LAD in 2006 at [facility], EF 60% at that time.  . Cancer [facility])    prostate  . CHF (congestive heart failure) ([place])   . Chronic pancreatitis ([place])   . Cirrhosis ([place])   . COPD (chronic obstructive pulmonary disease) ([place])   . COVID-19    08/22/20  . Dementia ([place])    early dementia  . Depression   . Diverticulitis    12/06/20 [facility]   . Diverticulosis   . Drug use   . Dyspnea   . ED (erectile dysfunction)   . Gastritis   . GERD (gastroesophageal reflux disease)   . Headache    occasional migraines  . Hepatitis C    treated  . History of lumbar fusion   . Hyperlipidemia   . Hypertension    patient denies having high blood pressure  . Lung cancer ([place])   . Mitral regurgitation   . Pancreatitis    x 2   . Pneumonia    07/06/18   . PTSD (post-traumatic stress disorder)   . Rib fracture    s/p fall 03/15/19   . Sinus disease   . Urge incontinence     Past Surgical History:  Procedure Laterality Date  . CARDIAC CATHETERIZATION  2006  . cateract  2013  . COLONOSCOPY WITH PROPOFOL N/A 10/04/2019   Procedure: COLONOSCOPY WITH PROPOFOL;  Surgeon: Lucilla Lame, MD;  Location: [facility] ENDOSCOPY;  Service: Endoscopy;  Laterality: N/A;  . CORONARY ANGIOPLASTY  2006  . CORONARY STENT PLACEMENT  2006   x 1  . ESOPHAGOGASTRODUODENOSCOPY N/A 12/22/2014   Procedure: ESOPHAGOGASTRODUODENOSCOPY (EGD);  Surgeon: [name], MD;  Location: [facility] ENDOSCOPY;  Service: Endoscopy;  Laterality: N/A;  . ESOPHAGOGASTRODUODENOSCOPY (EGD) WITH PROPOFOL N/A 10/04/2019   Procedure: ESOPHAGOGASTRODUODENOSCOPY (EGD) WITH PROPOFOL;  Surgeon: Lucilla Lame, MD;  Location: [facility] ENDOSCOPY;  Service: Endoscopy;  Laterality: N/A;  . HERNIA REPAIR  2015   left groin  . INTERCOSTAL NERVE BLOCK Left 11/09/2019   Procedure: Intercostal Nerve Block;  Surgeon: Melrose Nakayama, MD;  Location: [place];  Service: Thoracic;  Laterality: Left;  . LUMBAR FUSION    . RADIOACTIVE SEED  IMPLANT N/A 04/22/2016   Procedure: RADIOACTIVE SEED IMPLANT/BRACHYTHERAPY IMPLANT;  Surgeon: Hollice Espy, MD;  Location: [facility] ORS;  Service: Urology;  Laterality: N/A;  . ROBOT ASSISTED INGUINAL HERNIA REPAIR Bilateral 07/06/2018   Procedure: ROBOT ASSISTED INGUINAL HERNIA REPAIR;  Surgeon: Jules Husbands, MD;  Location: [facility] ORS;  Service: General;  Laterality: Bilateral;  . TONSILLECTOMY    . UMBILICAL HERNIA REPAIR N/A 07/06/2018   Procedure: LAPAROSCOPIC ROBOT ASSISTED UMBILICAL HERNIA;  Surgeon: Jules Husbands, MD;  Location: [facility] ORS;  Service: General;  Laterality: N/A;  . XI ROBOTIC ASSISTED THORACOSCOPY- SEGMENTECTOMY Left 11/09/2019   Procedure: XI ROBOTIC ASSISTED THORACOSCOPY-WEDGE RESECTION LEFT LOWER LOBE;  Surgeon: Melrose Nakayama, MD;  Location: [place];  Service: Thoracic;  Laterality: Left;     Current Outpatient Medications  Medication Sig Dispense Refill  . albuterol (PROVENTIL) (2.5 MG/3ML) 0.083% nebulizer solution TAKE 3 MLS BY NEBULIZATION EVERY 4 (FOUR) HOURS AS NEEDED FOR WHEEZING OR SHORTNESS OF BREATH. 375 mL 3  . albuterol (VENTOLIN HFA) 108 (90 Base) MCG/ACT inhaler Inhale 1-2 puffs into the lungs every 6 (six) hours as needed for wheezing or shortness of breath.     . alprazolam (XANAX) 1 MG tablet Take 1 tablet (1 mg total) by mouth as directed. 1/2 in am 1 pill qhs    . ARIPiprazole (ABILIFY) 10 MG tablet Take 10 mg by mouth daily.    Marland Kitchen aspirin EC 81 MG tablet Take 81 mg by mouth daily.    . Budeson-Glycopyrrol-Formoterol (BREZTRI AEROSPHERE) 160-9-4.8 MCG/ACT AERO Inhale 2 puffs into the lungs in the morning and at bedtime. 5.9 g 0  . Budeson-Glycopyrrol-Formoterol (BREZTRI AEROSPHERE) 160-9-4.8 MCG/ACT AERO Inhale 2 puffs into the lungs in the morning and at bedtime. 5.9 g 0  . celecoxib (CELEBREX) 200 MG capsule TAKE 1 CAPSULE (200 MG TOTAL) BY MOUTH DAILY AFTER BREAKFAST. 90 capsule 0  . clopidogrel (PLAVIX) 75 MG tablet TAKE 1 TABLET (75 MG TOTAL) BY  MOUTH DAILY. 90 tablet 3  . donepezil (ARICEPT) 5 MG tablet Take 1 tablet (5 mg total) by mouth at bedtime. 90 tablet 3  . FLUoxetine (PROZAC) 20 MG capsule Take 60 mg by mouth daily.  1  . hydrOXYzine (ATARAX/VISTARIL) 25 MG tablet Take 25 mg by mouth daily as needed.    . memantine (NAMENDA) 5 MG tablet Take 1 tablet (5 mg total) by mouth 2 (two) times daily. Taking qd 180 tablet 3  . metoprolol succinate (TOPROL-XL) 25 MG 24 hr tablet Take 0.5 tablets (12.5 mg total) by mouth daily. 45 tablet 3  . mirabegron ER (MYRBETRIQ) 50 MG TB24 tablet Take 1 tablet (50 mg total) by mouth daily. 90 tablet 3  . Multiple Vitamins-Minerals (MULTIVITAMIN  WITH MINERALS) tablet Take 1 tablet by mouth daily.    . nitroGLYCERIN (NITROSTAT) 0.4 MG SL tablet Place 1 tablet (0.4 mg total) under the tongue every 5 (five) minutes as needed for chest pain. Maximum of 3 doses 25 tablet 3  . ondansetron (ZOFRAN) 4 MG tablet Take 1 tablet (4 mg total) by mouth every 8 (eight) hours as needed. 40 tablet 2  . pantoprazole (PROTONIX) 40 MG tablet Take 1 tablet (40 mg total) by mouth daily. 30 min before breakfast 90 tablet 3  . potassium chloride 20 MEQ TBCR Take 20 mEq by mouth daily. 5 tablet 0  . rosuvastatin (CRESTOR) 10 MG tablet Take 1 tablet (10 mg total) by mouth at bedtime. 90 tablet 3  . sucralfate (CARAFATE) 1 g tablet Take 1 tablet (1 g total) by mouth 4 (four) times daily -  with meals and at bedtime. Stir in [employer] of liquid and let dissolve 120 tablet 2  . SYMBICORT 160-4.5 MCG/ACT inhaler INHALE 2 PUFFS INTO THE LUNGS 2 (TWO) TIMES DAILY. RINSE MOUTH OUT 30.6 each 4  . tamsulosin (FLOMAX) 0.4 MG CAPS capsule TAKE 1 CAPSULE (0.4 MG TOTAL) BY MOUTH DAILY AFTER SUPPER. 90 capsule 3   No current facility-administered medications for this visit.    Allergies:   Patient has no known allergies.    Social History:  The patient  reports that he has been smoking cigarettes. He started smoking about 58 years ago. He has  a 3.00 pack-year smoking history. He has never used smokeless tobacco. He reports current alcohol use of about 1.0 standard drink of alcohol per week. He reports current drug use. Frequency: 7.00 times per week. Drug: Marijuana.   Family History:  The patient's family history includes Alcohol abuse in his sister; Anxiety disorder in his sister; COPD in his brother; Colon cancer in his father; Depression in his sister; Drug abuse in his sister; Heart disease in his father; Obesity in his brother.    ROS:  Please see the history of present illness.   Otherwise, review of systems are positive for none.   All other systems are reviewed and negative.    PHYSICAL EXAM: VS:  BP 110/70 (BP Location: Left Arm, Patient Position: Sitting, Cuff Size: Normal)   Pulse (!) 58   Ht 6' (1.829 m)   Wt 243 lb 2 oz (110.3 kg)   SpO2 94%   BMI 32.97 kg/m  , BMI Body mass index is 32.97 kg/m. GEN: Well nourished, well developed, in no acute distress  HEENT: normal  Neck: no JVD, carotid bruits, or masses Cardiac: RRR with premature beats; no murmurs, rubs, or gallops,no edema.   Respiratory:  clear to auscultation bilaterally with diminished breath sounds bilaterally, normal work of breathing GI: soft, nontender, nondistended, + BS MS: no deformity or atrophy  Skin: warm and dry, no rash Neuro:  Strength and sensation are intact Psych: euthymic mood, full affect   EKG:  EKG is ordered today. The ekg ordered today demonstrates sinus bradycardia with left axis deviation and possible old inferior infarct.  Recent Labs: 10/30/2020: Magnesium 2.0 11/20/2020: ALT 22; BUN 22; Creatinine, Ser 1.10; Hemoglobin 12.6; Platelets 357; Potassium 4.1; Sodium 138    Lipid Panel    Component Value Date/Time   CHOL 111 05/21/2020 1051   TRIG 77 05/21/2020 1051   HDL 49 05/21/2020 1051   CHOLHDL 2.3 05/21/2020 1051   VLDL 15 05/21/2020 1051   LDLCALC 47 05/21/2020 1051  Wt Readings from Last 3 Encounters:   12/21/20 243 lb 2 oz (110.3 kg)  12/12/20 245 lb 3.2 oz (111.2 kg)  11/20/20 245 lb (111.1 kg)       PAD Screen 03/24/2019  Previous PAD dx? No  Previous surgical procedure? No  Pain with walking? No  Feet/toe relief with dangling? No  Painful, non-healing ulcers? No  Extremities discolored? No      ASSESSMENT AND PLAN:  1.  Coronary artery disease involving native coronary arteries without angina: He is doing well overall with no anginal symptoms.  Continue medical therapy.  Given that he has a first generation drug-eluting stent, continue dual antiplatelet therapy indefinitely as tolerated.  Given that he is mildly hypotensive and dizzy with underlying bradycardia, I elected to discontinue small dose Toprol.  2.  Hyperlipidemia: Continue treatment with rosuvastatin.  Most recent lipid profile showed an LDL of 47.  3.  Tobacco use: I again discussed with him the importance of smoking cessation but he reports inability to quit.  4.  COPD: On inhalers.  5.  Mild dementia: Currently on Aricept and Namenda.    Disposition:   FU with me in 6 months  Signed,  Kathlyn Sacramento, MD  12/21/2020 10:41 AM    [place]

## 2020-12-22 LAB — PSA: Prostate Specific Ag, Serum: 0.1 ng/mL (ref 0.0–4.0)

## 2020-12-25 ENCOUNTER — Ambulatory Visit: Payer: Self-pay | Admitting: Urology

## 2020-12-27 ENCOUNTER — Encounter: Payer: Self-pay | Admitting: Internal Medicine

## 2020-12-27 ENCOUNTER — Other Ambulatory Visit: Payer: Self-pay

## 2020-12-27 ENCOUNTER — Ambulatory Visit
Admission: RE | Admit: 2020-12-27 | Discharge: 2020-12-27 | Disposition: A | Discharge status: Home / Self Care | Payer: Medicare HMO | Source: Ambulatory Visit | Attending: Urology | Admitting: Urology

## 2020-12-27 ENCOUNTER — Ambulatory Visit: Payer: Medicare HMO | Admitting: Internal Medicine

## 2020-12-27 VITALS — BP 112/72 | HR 65 | Temp 97.8°F | Ht 72.0 in | Wt 245.6 lb

## 2020-12-27 DIAGNOSIS — K5750 Diverticulosis of both small and large intestine without perforation or abscess without bleeding: Secondary | ICD-10-CM | POA: Diagnosis not present

## 2020-12-27 DIAGNOSIS — R413 Other amnesia: Secondary | ICD-10-CM | POA: Diagnosis not present

## 2020-12-27 DIAGNOSIS — G8929 Other chronic pain: Secondary | ICD-10-CM

## 2020-12-27 DIAGNOSIS — Z981 Arthrodesis status: Secondary | ICD-10-CM | POA: Diagnosis not present

## 2020-12-27 DIAGNOSIS — N281 Cyst of kidney, acquired: Secondary | ICD-10-CM | POA: Insufficient documentation

## 2020-12-27 DIAGNOSIS — M47816 Spondylosis without myelopathy or radiculopathy, lumbar region: Secondary | ICD-10-CM | POA: Diagnosis not present

## 2020-12-27 DIAGNOSIS — K7689 Other specified diseases of liver: Secondary | ICD-10-CM | POA: Diagnosis not present

## 2020-12-27 DIAGNOSIS — M545 Low back pain, unspecified: Secondary | ICD-10-CM | POA: Diagnosis not present

## 2020-12-27 DIAGNOSIS — D7389 Other diseases of spleen: Secondary | ICD-10-CM | POA: Diagnosis not present

## 2020-12-27 HISTORY — DX: Arthrodesis status: Z98.1

## 2020-12-27 MED ORDER — OXYCODONE-ACETAMINOPHEN 5-325 MG PO TABS: 1.0000 | ORAL_TABLET | Freq: Two times a day (BID) | ORAL | 0 refills | Status: DC | PRN

## 2020-12-27 MED ORDER — GADOBUTROL 1 MMOL/ML IV SOLN
10.0000 mL | Freq: Once | INTRAVENOUS | Status: AC | PRN
  Administered 2020-12-27: 10 mL via INTRAVENOUS

## 2020-12-27 NOTE — Progress Notes (Signed)
Chief Complaint  Patient presents with  . Follow-up   F/u   1. Chronic back pain with arthritis, ddd and mri 11/08/15 mild to moderate stenosis tried PT in the past w/o help declines PT now and epidural injections  Declines mri for now at times knees weak denies kne pain  Pain daily at times 6-7-8/10  2. Memory getting worse h/o mental health d/o mood and anxiety and alcohol abuse still not drinking phq 9 score 10 and gad 7 score 8 today  Cc neurology to try to get appt sooner than 03/29/21  3. bp improved off lasix 40 and off 1/2 toprol xl 25 mg qd   Review of Systems  Constitutional: Negative for weight loss.  HENT: Negative for hearing loss.   Eyes: Negative for blurred vision.  Respiratory: Negative for shortness of breath.   Cardiovascular: Negative for chest pain.  Gastrointestinal: Negative for abdominal pain.  Musculoskeletal: Positive for falls.  Skin: Negative for rash.  Psychiatric/Behavioral: Positive for memory loss.   Past Medical History:  Diagnosis Date  . Alcohol abuse    pt states it is marijuana  . Anemia   . Arthritis   . Bipolar disorder ([place])   . BPH (benign prostatic hypertrophy) with urinary obstruction   . CAD (coronary artery disease)    a. s/p stent to LAD in 2006 at [facility], EF 60% at that time.  . Cancer [facility])    prostate  . CHF (congestive heart failure) ([place])   . Chronic pancreatitis ([place])   . Cirrhosis ([place])   . COPD (chronic obstructive pulmonary disease) ([place])   . COVID-19    08/22/20  . Dementia ([place])    early dementia  . Depression   . Diverticulitis    12/06/20 [facility]   . Diverticulosis   . Drug use   . Dyspnea   . ED (erectile dysfunction)   . Gastritis   . GERD (gastroesophageal reflux disease)   . Headache    occasional migraines  . Hepatitis C    treated  . History of lumbar fusion   . Hyperlipidemia   . Hypertension    patient denies having high blood pressure  . Lung cancer ([place])   . Mitral regurgitation   . Pancreatitis     x 2   . Pneumonia    07/06/18   . PTSD (post-traumatic stress disorder)   . Rib fracture    s/p fall 03/15/19   . Sinus disease   . Urge incontinence    Past Surgical History:  Procedure Laterality Date  . CARDIAC CATHETERIZATION  2006  . cateract  2013  . COLONOSCOPY WITH PROPOFOL N/A 10/04/2019   Procedure: COLONOSCOPY WITH PROPOFOL;  Surgeon: Lucilla Lame, MD;  Location: [facility] ENDOSCOPY;  Service: Endoscopy;  Laterality: N/A;  . CORONARY ANGIOPLASTY  2006  . CORONARY STENT PLACEMENT  2006   x 1  . ESOPHAGOGASTRODUODENOSCOPY N/A 12/22/2014   Procedure: ESOPHAGOGASTRODUODENOSCOPY (EGD);  Surgeon: [name], MD;  Location: [facility] ENDOSCOPY;  Service: Endoscopy;  Laterality: N/A;  . ESOPHAGOGASTRODUODENOSCOPY (EGD) WITH PROPOFOL N/A 10/04/2019   Procedure: ESOPHAGOGASTRODUODENOSCOPY (EGD) WITH PROPOFOL;  Surgeon: Lucilla Lame, MD;  Location: [facility] ENDOSCOPY;  Service: Endoscopy;  Laterality: N/A;  . HERNIA REPAIR  2015   left groin  . INTERCOSTAL NERVE BLOCK Left 11/09/2019   Procedure: Intercostal Nerve Block;  Surgeon: Melrose Nakayama, MD;  Location: [place];  Service: Thoracic;  Laterality: Left;  . LUMBAR FUSION    .  RADIOACTIVE SEED IMPLANT N/A 04/22/2016   Procedure: RADIOACTIVE SEED IMPLANT/BRACHYTHERAPY IMPLANT;  Surgeon: Hollice Espy, MD;  Location: [facility] ORS;  Service: Urology;  Laterality: N/A;  . ROBOT ASSISTED INGUINAL HERNIA REPAIR Bilateral 07/06/2018   Procedure: ROBOT ASSISTED INGUINAL HERNIA REPAIR;  Surgeon: Jules Husbands, MD;  Location: [facility] ORS;  Service: General;  Laterality: Bilateral;  . TONSILLECTOMY    . UMBILICAL HERNIA REPAIR N/A 07/06/2018   Procedure: LAPAROSCOPIC ROBOT ASSISTED UMBILICAL HERNIA;  Surgeon: Jules Husbands, MD;  Location: [facility] ORS;  Service: General;  Laterality: N/A;  . XI ROBOTIC ASSISTED THORACOSCOPY- SEGMENTECTOMY Left 11/09/2019   Procedure: XI ROBOTIC ASSISTED THORACOSCOPY-WEDGE RESECTION LEFT LOWER LOBE;  Surgeon:  Melrose Nakayama, MD;  Location: [facility] OR;  Service: Thoracic;  Laterality: Left;   Family History  Problem Relation Age of Onset  . Heart disease Father        Unclear details. Father passed in late 12's 2/2 cancer.  . Colon cancer Father   . Alcohol abuse Sister   . Drug abuse Sister   . Anxiety disorder Sister   . Depression Sister   . COPD Brother   . Obesity Brother   . Kidney disease Neg Hx   . Prostate cancer Neg Hx    Social History   Socioeconomic History  . Marital status: Divorced    Spouse name: Not on file  . Number of children: 4  . Years of education: Not on file  . Highest education level: GED or equivalent  Occupational History  . Occupation: Aeronautical engineer    Comment: retired  Tobacco Use  . Smoking status: Current Every Day Smoker    Packs/day: 1.00    Years: 3.00    Pack years: 3.00    Types: Cigarettes    Start date: 07/28/1962  . Smokeless tobacco: Never Used  . Tobacco comment: started back in march. 1 pack a day.  Vaping Use  . Vaping Use: Never used  Substance and Sexual Activity  . Alcohol use: Yes    Alcohol/week: 1.0 standard drink    Types: 1 Cans of beer per week    Comment: 2-3 beers a day  . Drug use: Yes    Frequency: 7.0 times per week    Types: Marijuana    Comment: Endorsed heroin 06/2014, UDS also + benzos, opiates, THC, neg for cocaine at that time.  Marland Kitchen Sexual activity: Not Currently  Other Topics Concern  . Not on file  Social History Narrative   Lives at home    Has kids and grandkids   Smoker x 55 years 1 ppd as of 07/2019 he did quit x 2 years    Drinks 1 pt liq qd as of 07/2019    Social Determinants of Health   Financial Resource Strain: Low Risk   . Difficulty of Paying Living Expenses: Not very hard  Food Insecurity: No Food Insecurity  . Worried About Charity fundraiser in the Last Year: Never true  . Ran Out of Food in the Last Year: Never true  Transportation Needs: No Transportation Needs  . Lack of  Transportation (Medical): No  . Lack of Transportation (Non-Medical): No  Physical Activity: Not on file  Stress: No Stress Concern Present  . Feeling of Stress : Not at all  Social Connections: Not on file  Intimate Partner Violence: Not At Risk  . Fear of Current or Ex-Partner: No  . Emotionally Abused: No  . Physically Abused: No  .  Sexually Abused: No   Current Meds  Medication Sig  . albuterol (PROVENTIL) (2.5 MG/3ML) 0.083% nebulizer solution TAKE 3 MLS BY NEBULIZATION EVERY 4 (FOUR) HOURS AS NEEDED FOR WHEEZING OR SHORTNESS OF BREATH.  Marland Kitchen albuterol (VENTOLIN HFA) 108 (90 Base) MCG/ACT inhaler Inhale 1-2 puffs into the lungs every 6 (six) hours as needed for wheezing or shortness of breath.   . alprazolam (XANAX) 1 MG tablet Take 1 tablet (1 mg total) by mouth as directed. 1/2 in am 1 pill qhs  . ARIPiprazole (ABILIFY) 10 MG tablet Take 10 mg by mouth daily.  Marland Kitchen aspirin EC 81 MG tablet Take 81 mg by mouth daily.  . Budeson-Glycopyrrol-Formoterol (BREZTRI AEROSPHERE) 160-9-4.8 MCG/ACT AERO Inhale 2 puffs into the lungs in the morning and at bedtime.  . Budeson-Glycopyrrol-Formoterol (BREZTRI AEROSPHERE) 160-9-4.8 MCG/ACT AERO Inhale 2 puffs into the lungs in the morning and at bedtime.  . celecoxib (CELEBREX) 200 MG capsule TAKE 1 CAPSULE (200 MG TOTAL) BY MOUTH DAILY AFTER BREAKFAST.  Marland Kitchen clopidogrel (PLAVIX) 75 MG tablet TAKE 1 TABLET (75 MG TOTAL) BY MOUTH DAILY.  Marland Kitchen donepezil (ARICEPT) 5 MG tablet Take 1 tablet (5 mg total) by mouth at bedtime.  Marland Kitchen FLUoxetine (PROZAC) 20 MG capsule Take 60 mg by mouth daily.  . hydrOXYzine (ATARAX/VISTARIL) 25 MG tablet Take 25 mg by mouth daily as needed.  . memantine (NAMENDA) 5 MG tablet Take 1 tablet (5 mg total) by mouth 2 (two) times daily. Taking qd  . mirabegron ER (MYRBETRIQ) 50 MG TB24 tablet Take 1 tablet (50 mg total) by mouth daily.  . Multiple Vitamins-Minerals (MULTIVITAMIN WITH MINERALS) tablet Take 1 tablet by mouth daily.  .  nitroGLYCERIN (NITROSTAT) 0.4 MG SL tablet Place 1 tablet (0.4 mg total) under the tongue every 5 (five) minutes as needed for chest pain. Maximum of 3 doses  . ondansetron (ZOFRAN) 4 MG tablet Take 1 tablet (4 mg total) by mouth every 8 (eight) hours as needed.  Marland Kitchen oxyCODONE-acetaminophen (PERCOCET) 5-325 MG tablet Take 1 tablet by mouth 2 (two) times daily as needed for severe pain.  . pantoprazole (PROTONIX) 40 MG tablet Take 1 tablet (40 mg total) by mouth daily. 30 min before breakfast  . potassium chloride 20 MEQ TBCR Take 20 mEq by mouth daily.  . rosuvastatin (CRESTOR) 10 MG tablet Take 1 tablet (10 mg total) by mouth at bedtime.  . sucralfate (CARAFATE) 1 g tablet Take 1 tablet (1 g total) by mouth 4 (four) times daily -  with meals and at bedtime. Stir in [employer] of liquid and let dissolve  . SYMBICORT 160-4.5 MCG/ACT inhaler INHALE 2 PUFFS INTO THE LUNGS 2 (TWO) TIMES DAILY. RINSE MOUTH OUT  . tamsulosin (FLOMAX) 0.4 MG CAPS capsule TAKE 1 CAPSULE (0.4 MG TOTAL) BY MOUTH DAILY AFTER SUPPER.   No Known Allergies Recent Results (from the past 2160 hour(s))  Basic metabolic panel     Status: Abnormal   Collection Time: 10/30/20  3:03 PM  Result Value Ref Range   Sodium 136 135 - 145 mmol/L   Potassium 4.2 3.5 - 5.1 mmol/L   Chloride 104 98 - 111 mmol/L   CO2 22 22 - 32 mmol/L   Glucose, Bld 103 (H) 70 - 99 mg/dL    Comment: Glucose reference range applies only to samples taken after fasting for at least 8 hours.   BUN 15 8 - 23 mg/dL   Creatinine, Ser 1.14 0.61 - 1.24 mg/dL   Calcium 9.2 8.9 -  10.3 mg/dL   GFR, Estimated >60 >60 mL/min    Comment: (NOTE) Calculated using the CKD-EPI Creatinine Equation (2021)    Anion gap 10 5 - 15    Comment: Performed at [facility], [address] [place], [place] [address]  CBC     Status: Abnormal   Collection Time: 10/30/20  3:03 PM  Result Value Ref Range   WBC 11.8 (H) 4.0 - 10.5 K/uL   RBC 3.86 (L) 4.22 - 5.81 MIL/uL    Hemoglobin 12.9 (L) 13.0 - 17.0 g/dL   HCT 37.3 (L) 39.0 - 52.0 %   MCV 96.6 80.0 - 100.0 fL   MCH 33.4 26.0 - 34.0 pg   MCHC 34.6 30.0 - 36.0 g/dL   RDW 14.6 11.5 - 15.5 %   Platelets 301 150 - 400 K/uL   nRBC 0.0 0.0 - 0.2 %    Comment: Performed at [facility], [address] [place], [place] [address]  Lipase, blood     Status: None   Collection Time: 10/30/20  3:03 PM  Result Value Ref Range   Lipase 33 11 - 51 U/L    Comment: Performed at [facility], [address] [place], [place] [address]  Hepatic function panel     Status: None   Collection Time: 10/30/20  3:03 PM  Result Value Ref Range   Total Protein 7.3 6.5 - 8.1 g/dL   Albumin 4.1 3.5 - 5.0 g/dL   AST 24 15 - 41 U/L   ALT 20 0 - 44 U/L   Alkaline Phosphatase 79 38 - 126 U/L   Total Bilirubin 0.9 0.3 - 1.2 mg/dL   Bilirubin, Direct 0.2 0.0 - 0.2 mg/dL   Indirect Bilirubin 0.7 0.3 - 0.9 mg/dL    Comment: Performed at [facility], [address] [place], [place] [address]  Magnesium     Status: None   Collection Time: 10/30/20  3:03 PM  Result Value Ref Range   Magnesium 2.0 1.7 - 2.4 mg/dL    Comment: Performed at [facility], [address] [place], [place] [address]  Urinalysis, Complete w Microscopic     Status: Abnormal   Collection Time: 10/30/20  5:02 PM  Result Value Ref Range   Color, Urine AMBER (A) YELLOW    Comment: BIOCHEMICALS MAY BE AFFECTED BY COLOR   APPearance HAZY (A) CLEAR   Specific Gravity, Urine 1.019 1.005 - 1.030   pH 5.0 5.0 - 8.0   Glucose, UA NEGATIVE NEGATIVE mg/dL   Hgb urine dipstick NEGATIVE NEGATIVE   Bilirubin Urine NEGATIVE NEGATIVE   Ketones, ur 20 (A) NEGATIVE mg/dL   Protein, ur 30 (A) NEGATIVE mg/dL   Nitrite NEGATIVE NEGATIVE   Leukocytes,Ua NEGATIVE NEGATIVE   RBC / HPF 0-5 0 - 5 RBC/hpf   WBC, UA 0-5 0 - 5 WBC/hpf   Bacteria, UA NONE SEEN NONE SEEN   Squamous Epithelial / LPF 0-5 0 - 5   Mucus PRESENT    Hyaline  Casts, UA PRESENT     Comment: Performed at [facility], [address] [place], [place] [address]  Urine Culture     Status: None   Collection Time: 11/01/20  3:30 PM   Specimen: Urine   Urine  Result Value Ref Range   Urine Culture, Routine Final report    Organism ID, Bacteria No growth   Gastrointestinal Panel by PCR , Stool     Status: None  Collection Time: 11/02/20 10:30 AM   Specimen: STOOL  Result Value Ref Range   Campylobacter species NOT DETECTED NOT DETECTED   Plesimonas shigelloides NOT DETECTED NOT DETECTED   Salmonella species NOT DETECTED NOT DETECTED   Yersinia enterocolitica NOT DETECTED NOT DETECTED   Vibrio species NOT DETECTED NOT DETECTED   Vibrio cholerae NOT DETECTED NOT DETECTED   Enteroaggregative E coli (EAEC) NOT DETECTED NOT DETECTED   Enteropathogenic E coli (EPEC) NOT DETECTED NOT DETECTED   Enterotoxigenic E coli (ETEC) NOT DETECTED NOT DETECTED   Shiga like toxin producing E coli (STEC) NOT DETECTED NOT DETECTED   Shigella/Enteroinvasive E coli (EIEC) NOT DETECTED NOT DETECTED   Cryptosporidium NOT DETECTED NOT DETECTED   Cyclospora cayetanensis NOT DETECTED NOT DETECTED   Entamoeba histolytica NOT DETECTED NOT DETECTED   Giardia lamblia NOT DETECTED NOT DETECTED   Adenovirus F40/41 NOT DETECTED NOT DETECTED   Astrovirus NOT DETECTED NOT DETECTED   Norovirus GI/GII NOT DETECTED NOT DETECTED   Rotavirus A NOT DETECTED NOT DETECTED   Sapovirus (I, II, IV, and V) NOT DETECTED NOT DETECTED    Comment: Performed at [facility], [address] [place], [place] [address]  C Difficile Quick Screen w PCR reflex     Status: None   Collection Time: 11/02/20 10:30 AM   Specimen: STOOL  Result Value Ref Range   C Diff antigen NEGATIVE NEGATIVE   C Diff toxin NEGATIVE NEGATIVE   C Diff interpretation No C. difficile detected.     Comment: Performed at [facility], [place], [place] [address]  SARS  CORONAVIRUS 2 (TAT 6-24 HRS) Nasopharyngeal Nasopharyngeal Swab     Status: None   Collection Time: 11/02/20 10:45 AM   Specimen: Nasopharyngeal Swab  Result Value Ref Range   SARS Coronavirus 2 NEGATIVE NEGATIVE    Comment: (NOTE) SARS-CoV-2 target nucleic acids are NOT DETECTED.  The SARS-CoV-2 RNA is generally detectable in upper and lower respiratory specimens during the acute phase of infection. Negative results do not preclude SARS-CoV-2 infection, do not rule out co-infections with other pathogens, and should not be used as the sole basis for treatment or other patient management decisions. Negative results must be combined with clinical observations, patient history, and epidemiological information. The expected result is Negative.  Fact Sheet for Patients: SugarRoll.be  Fact Sheet for Healthcare Providers: https://www.woods-mathews.com/  This test is not yet approved or cleared by the [place] FDA and  has been authorized for detection and/or diagnosis of SARS-CoV-2 by FDA under an Emergency Use Authorization (EUA). This EUA will remain  in effect (meaning this test can be used) for the duration of the COVID-19 declaration under Se ction 564(b)(1) of the Act, 21 U.S.C. section 360bbb-3(b)(1), unless the authorization is terminated or revoked sooner.  Performed at [facility], [place] [address] [place], [place] [address]   Comprehensive metabolic panel     Status: None   Collection Time: 11/06/20  5:06 PM  Result Value Ref Range   Sodium 137 135 - 145 mmol/L   Potassium 4.2 3.5 - 5.1 mmol/L   Chloride 103 98 - 111 mmol/L   CO2 26 22 - 32 mmol/L   Glucose, Bld 82 70 - 99 mg/dL    Comment: Glucose reference range applies only to samples taken after fasting for at least 8 hours.   BUN 9 8 - 23 mg/dL   Creatinine, Ser 1.11 0.61 - 1.24 mg/dL   Calcium 9.2  8.9 - 10.3 mg/dL   Total Protein 7.3 6.5 - 8.1 g/dL   Albumin  3.7 3.5 - 5.0 g/dL   AST 23 15 - 41 U/L   ALT 21 0 - 44 U/L   Alkaline Phosphatase 78 38 - 126 U/L   Total Bilirubin 0.9 0.3 - 1.2 mg/dL   GFR, Estimated >60 >60 mL/min    Comment: (NOTE) Calculated using the CKD-EPI Creatinine Equation (2021)    Anion gap 8 5 - 15    Comment: Performed at [facility] [address] [place], [place] [address]  Ethanol     Status: None   Collection Time: 11/06/20  5:06 PM  Result Value Ref Range   Alcohol, Ethyl (B) <10 <10 mg/dL    Comment: (NOTE) Lowest detectable limit for serum alcohol is 10 mg/dL.  For medical purposes only. Performed at [facility], [place] [address] [place], [place] [address]   cbc     Status: Abnormal   Collection Time: 11/06/20  5:06 PM  Result Value Ref Range   WBC 13.6 (H) 4.0 - 10.5 K/uL   RBC 3.76 (L) 4.22 - 5.81 MIL/uL   Hemoglobin 12.3 (L) 13.0 - 17.0 g/dL   HCT 37.3 (L) 39.0 - 52.0 %   MCV 99.2 80.0 - 100.0 fL   MCH 32.7 26.0 - 34.0 pg   MCHC 33.0 30.0 - 36.0 g/dL   RDW 14.3 11.5 - 15.5 %   Platelets 253 150 - 400 K/uL   nRBC 0.0 0.0 - 0.2 %    Comment: Performed at [facility], [place] [address] [place], [place] [address]  CK     Status: None   Collection Time: 11/06/20  5:06 PM  Result Value Ref Range   Total CK 195 49 - 397 U/L    Comment: Performed at [facility], [place] [address], [place], [place] [address]  Comprehensive metabolic panel     Status: Abnormal   Collection Time: 11/20/20  9:58 AM  Result Value Ref Range   Sodium 138 135 - 145 mmol/L   Potassium 4.1 3.5 - 5.1 mmol/L   Chloride 104 98 - 111 mmol/L   CO2 24 22 - 32 mmol/L   Glucose, Bld 113 (H) 70 - 99 mg/dL    Comment: Glucose reference range applies only to samples taken after fasting for at least 8 hours.   BUN 22 8 - 23 mg/dL   Creatinine, Ser 1.10 0.61 - 1.24 mg/dL   Calcium 9.0 8.9 - 10.3 mg/dL   Total Protein 7.4 6.5 - 8.1 g/dL   Albumin 3.8 3.5 - 5.0 g/dL   AST 21 15 - 41 U/L   ALT 22 0 - 44 U/L    Alkaline Phosphatase 73 38 - 126 U/L   Total Bilirubin 0.5 0.3 - 1.2 mg/dL   GFR, Estimated >60 >60 mL/min    Comment: (NOTE) Calculated using the CKD-EPI Creatinine Equation (2021)    Anion gap 10 5 - 15    Comment: Performed at [facility], [address] [place], [place] [address]  CBC with Differential     Status: Abnormal   Collection Time: 11/20/20  9:58 AM  Result Value Ref Range   WBC 9.9 4.0 - 10.5 K/uL   RBC 3.79 (L) 4.22 - 5.81 MIL/uL   Hemoglobin 12.6 (L) 13.0 - 17.0 g/dL   HCT 36.6 (L) 39.0 - 52.0 %   MCV 96.6 80.0 -  100.0 fL   MCH 33.2 26.0 - 34.0 pg   MCHC 34.4 30.0 - 36.0 g/dL   RDW 14.1 11.5 - 15.5 %   Platelets 357 150 - 400 K/uL   nRBC 0.0 0.0 - 0.2 %   Neutrophils Relative % 68 %   Neutro Abs 6.8 1.7 - 7.7 K/uL   Lymphocytes Relative 19 %   Lymphs Abs 1.9 0.7 - 4.0 K/uL   Monocytes Relative 9 %   Monocytes Absolute 0.9 0.1 - 1.0 K/uL   Eosinophils Relative 3 %   Eosinophils Absolute 0.3 0.0 - 0.5 K/uL   Basophils Relative 1 %   Basophils Absolute 0.1 0.0 - 0.1 K/uL   Immature Granulocytes 0 %   Abs Immature Granulocytes 0.04 0.00 - 0.07 K/uL    Comment: Performed at [facility], [address] [place], [place] [address]  ECHOCARDIOGRAM COMPLETE     Status: None   Collection Time: 12/18/20 11:13 AM  Result Value Ref Range   AR max vel 4.55 cm2   AV Peak grad 4.6 mmHg   Ao pk vel 1.07 m/s   S' Lateral 3.40 cm   Area-P 1/2 2.23 cm2   AV Area VTI 4.23 cm2   AV Mean grad 3.0 mmHg   Single Plane A4C EF 61.5 %   Single Plane A2C EF 54.9 %   Calc EF 58.6 %   AV Area mean vel 4.23 cm2  PSA     Status: None   Collection Time: 12/21/20 11:23 AM  Result Value Ref Range   Prostate Specific Ag, Serum 0.1 0.0 - 4.0 ng/mL    Comment: Roche ECLIA methodology. According to the American Urological Association, Serum PSA should decrease and remain at undetectable levels after radical prostatectomy. The AUA defines biochemical recurrence as an  initial PSA value 0.2 ng/mL or greater followed by a subsequent confirmatory PSA value 0.2 ng/mL or greater. Values obtained with different assay methods or kits cannot be used interchangeably. Results cannot be interpreted as absolute evidence of the presence or absence of malignant disease.    Objective  Body mass index is 33.31 kg/m. Wt Readings from Last 3 Encounters:  12/27/20 245 lb 9.6 oz (111.4 kg)  12/21/20 243 lb 2 oz (110.3 kg)  12/12/20 245 lb 3.2 oz (111.2 kg)   Temp Readings from Last 3 Encounters:  12/27/20 97.8 F (36.6 C) (Oral)  12/12/20 (!) 97.5 F (36.4 C) (Oral)  11/20/20 (!) 96.5 F (35.8 C)   BP Readings from Last 3 Encounters:  12/27/20 112/72  12/21/20 110/70  11/20/20 122/86   Pulse Readings from Last 3 Encounters:  12/27/20 65  12/21/20 (!) 58  11/20/20 66    Physical Exam Vitals and nursing note reviewed.  Constitutional:      Appearance: Normal appearance. He is well-developed and well-groomed. He is obese.  HENT:     Head: Normocephalic and atraumatic.  Eyes:     Conjunctiva/sclera: Conjunctivae normal.     Pupils: Pupils are equal, round, and reactive to light.  Cardiovascular:     Rate and Rhythm: Normal rate and regular rhythm.     Heart sounds: Normal heart sounds. No murmur heard.   Pulmonary:     Effort: Pulmonary effort is normal.     Breath sounds: Normal breath sounds.  Musculoskeletal:     Lumbar back: Tenderness present. Negative right straight leg raise test and negative left straight leg raise test.  Skin:    General: Skin  is warm and dry.  Neurological:     General: No focal deficit present.     Mental Status: He is alert and oriented to person, place, and time. Mental status is at baseline.  Psychiatric:        Attention and Perception: Attention and perception normal.        Mood and Affect: Mood and affect normal.        Speech: Speech normal.        Behavior: Behavior normal. Behavior is cooperative.         Thought Content: Thought content normal.        Cognition and Memory: Cognition and memory normal.        Judgment: Judgment normal.     Assessment  Plan  Chronic bilateral low back pain without sciatica s/p lumbar fusion- Plan: oxyCODONE-acetaminophen (PERCOCET) 5-325 MG tablet bid prn #20  Reviewed mris and imaging  Pain contract signed today  Memory loss  Anxiety/depression  -f/u [facility] neurology   HM Flu shot utd  prevnar utd -->pna 23due in 1 year9/18/21 Tdapconsider futurerx today 12/27/20 shingrixconsider future covid vx per pt had3/3pfizer rec 4th  COPD + Smoker 1 ppd x 55 years as of 07/2019 and had quit x 2 years in the past CT chest sch 05/17/20 s/p LLL resection 4/14/21and CT chest repeat 11/16/20  H/o hep C tx'ed [facility] last 11/13/15 not detected quant will recheck with hep A immune 09/21/14/hep B >1000 09/21/14 and sAg negative  Alcohol abuse drinks 1 pt liquor qdas of 11/01/20 cute back  H/o prostate cancers/p brachytx with seedsPSA 05/18/19 0.22 f/u Dr.Brandon urology 10/2019 and Dr. Massie Maroon rad/onc PSA 05/23/20 <0.01 F/u urology 5/22  Skin-normal exam except for bruisingand reason for visit -seen [place] skin 2021or Dr. Phillip Heal  EGD/colonoscopy 10/04/19 Colonoscopy3/9/21 tubular and hyperplastic Dr. Allen Norris f/u in 5 years   Neurology Dr. Manuella Ghazi Cards Dr. Fletcher Anon  [facility] psych pulm Dr. Patsey Berthold   Provider: Dr. Olivia Mackie McLean-Scocuzza-Internal Medicine

## 2020-12-27 NOTE — Patient Instructions (Addendum)
Consider pfizer 4th dose now  Consider Tdap Call back if you dont hear from neurology or [facility] mental health  salonpas or lidocaine pain patch over the counter

## 2021-01-01 ENCOUNTER — Telehealth: Payer: Self-pay

## 2021-01-01 NOTE — Telephone Encounter (Signed)
Faxed note from Dr [facility] asking Dr Manuella Ghazi for sooner appt due to memory getting worse. F [phone]

## 2021-01-02 ENCOUNTER — Ambulatory Visit: Payer: Self-pay | Admitting: Urology

## 2021-01-08 ENCOUNTER — Ambulatory Visit: Payer: Self-pay | Admitting: Urology

## 2021-01-14 ENCOUNTER — Other Ambulatory Visit: Payer: Self-pay | Admitting: Internal Medicine

## 2021-01-14 ENCOUNTER — Telehealth: Payer: Self-pay | Admitting: Internal Medicine

## 2021-01-14 DIAGNOSIS — Z981 Arthrodesis status: Secondary | ICD-10-CM

## 2021-01-14 DIAGNOSIS — G8929 Other chronic pain: Secondary | ICD-10-CM

## 2021-01-14 MED ORDER — OXYCODONE-ACETAMINOPHEN 5-325 MG PO TABS
1.0000 | ORAL_TABLET | Freq: Two times a day (BID) | ORAL | 0 refills | Status: DC | PRN
Start: 2021-01-14 — End: 2021-01-28

## 2021-01-14 NOTE — Telephone Encounter (Signed)
Contract printed. Patient informed and verbalized understanding. Will come in to sign this today

## 2021-01-14 NOTE — Telephone Encounter (Signed)
Patient informed and verbalized understanding.  Patient would like to sign a pain contract as he has chronic pain.   What sig will you be using for this medication for the contract. Patient aware that you are out of office this week and is okay to come in next week to sign contract

## 2021-01-14 NOTE — Telephone Encounter (Signed)
Bid same dose # 40 per month no refills

## 2021-01-14 NOTE — Telephone Encounter (Signed)
Patient is requesting a refill on his oxyCODONE-acetaminophen (PERCOCET) 5-325 MG tablet

## 2021-01-14 NOTE — Telephone Encounter (Signed)
If going to need long term will need to sign pain contract this is last fill before pain contract needs to be signed he can come to office to do this and have reviewed on nurse visit while I am away   Thank you

## 2021-01-15 DIAGNOSIS — F341 Dysthymic disorder: Secondary | ICD-10-CM | POA: Diagnosis not present

## 2021-01-15 DIAGNOSIS — F4310 Post-traumatic stress disorder, unspecified: Secondary | ICD-10-CM | POA: Diagnosis not present

## 2021-01-15 DIAGNOSIS — F1020 Alcohol dependence, uncomplicated: Secondary | ICD-10-CM | POA: Diagnosis not present

## 2021-01-15 DIAGNOSIS — F411 Generalized anxiety disorder: Secondary | ICD-10-CM | POA: Diagnosis not present

## 2021-01-15 DIAGNOSIS — F331 Major depressive disorder, recurrent, moderate: Secondary | ICD-10-CM | POA: Diagnosis not present

## 2021-01-21 ENCOUNTER — Other Ambulatory Visit: Payer: Self-pay

## 2021-01-21 ENCOUNTER — Other Ambulatory Visit: Payer: Self-pay | Admitting: Internal Medicine

## 2021-01-21 ENCOUNTER — Emergency Department
Admission: EM | Admit: 2021-01-21 | Discharge: 2021-01-21 | Disposition: A | Discharge status: Home / Self Care | Payer: Medicare HMO | Attending: Emergency Medicine | Admitting: Emergency Medicine

## 2021-01-21 ENCOUNTER — Encounter: Payer: Self-pay | Admitting: Emergency Medicine

## 2021-01-21 ENCOUNTER — Telehealth: Payer: Self-pay | Admitting: Internal Medicine

## 2021-01-21 DIAGNOSIS — R918 Other nonspecific abnormal finding of lung field: Secondary | ICD-10-CM | POA: Diagnosis not present

## 2021-01-21 DIAGNOSIS — R42 Dizziness and giddiness: Secondary | ICD-10-CM | POA: Diagnosis not present

## 2021-01-21 DIAGNOSIS — S0990XA Unspecified injury of head, initial encounter: Secondary | ICD-10-CM | POA: Diagnosis not present

## 2021-01-21 DIAGNOSIS — Z981 Arthrodesis status: Secondary | ICD-10-CM

## 2021-01-21 DIAGNOSIS — Z5321 Procedure and treatment not carried out due to patient leaving prior to being seen by health care provider: Secondary | ICD-10-CM | POA: Diagnosis not present

## 2021-01-21 DIAGNOSIS — I2510 Atherosclerotic heart disease of native coronary artery without angina pectoris: Secondary | ICD-10-CM | POA: Diagnosis not present

## 2021-01-21 DIAGNOSIS — R0902 Hypoxemia: Secondary | ICD-10-CM | POA: Diagnosis not present

## 2021-01-21 DIAGNOSIS — Z9981 Dependence on supplemental oxygen: Secondary | ICD-10-CM | POA: Diagnosis not present

## 2021-01-21 DIAGNOSIS — J941 Fibrothorax: Secondary | ICD-10-CM | POA: Diagnosis not present

## 2021-01-21 DIAGNOSIS — G8929 Other chronic pain: Secondary | ICD-10-CM

## 2021-01-21 DIAGNOSIS — R079 Chest pain, unspecified: Secondary | ICD-10-CM | POA: Diagnosis not present

## 2021-01-21 DIAGNOSIS — R0602 Shortness of breath: Secondary | ICD-10-CM | POA: Diagnosis not present

## 2021-01-21 DIAGNOSIS — R55 Syncope and collapse: Secondary | ICD-10-CM | POA: Diagnosis not present

## 2021-01-21 LAB — CBC
HCT: 34.9 % — ABNORMAL LOW (ref 39.0–52.0)
Hemoglobin: 12.2 g/dL — ABNORMAL LOW (ref 13.0–17.0)
MCH: 33.5 pg (ref 26.0–34.0)
MCHC: 35 g/dL (ref 30.0–36.0)
MCV: 95.9 fL (ref 80.0–100.0)
Platelets: 303 10*3/uL (ref 150–400)
RBC: 3.64 MIL/uL — ABNORMAL LOW (ref 4.22–5.81)
RDW: 15.9 % — ABNORMAL HIGH (ref 11.5–15.5)
WBC: 8.7 10*3/uL (ref 4.0–10.5)
nRBC: 0 % (ref 0.0–0.2)

## 2021-01-21 LAB — BASIC METABOLIC PANEL
Anion gap: 4 — ABNORMAL LOW (ref 5–15)
BUN: 25 mg/dL — ABNORMAL HIGH (ref 8–23)
CO2: 26 mmol/L (ref 22–32)
Calcium: 8.7 mg/dL — ABNORMAL LOW (ref 8.9–10.3)
Chloride: 108 mmol/L (ref 98–111)
Creatinine, Ser: 1.22 mg/dL (ref 0.61–1.24)
GFR, Estimated: >60 mL/min (ref >60)
Glucose, Bld: 83 mg/dL (ref 70–99)
Potassium: 4.6 mmol/L (ref 3.5–5.1)
Sodium: 138 mmol/L (ref 135–145)

## 2021-01-21 NOTE — ED Triage Notes (Signed)
Arrives via [facility].  C/O dizziness x 1 month.  States every night when he gets up to go to the bathroom, feels dizzy.  Same thing happens each morning.  AAOx3.  Skin warm and dry. NAD

## 2021-01-21 NOTE — Telephone Encounter (Signed)
PT is needing a refill of their oxyCODONE-acetaminophen (PERCOCET) 5-325 MG tablet as they are currently out of it.

## 2021-01-21 NOTE — Telephone Encounter (Signed)
Last filled 01/14/21 20 tablets, last OV 12/27/2020 please advise to refill.

## 2021-01-21 NOTE — Telephone Encounter (Signed)
Medication last sent in 20 tablets 01/14/21

## 2021-01-22 ENCOUNTER — Telehealth: Payer: Self-pay | Admitting: Internal Medicine

## 2021-01-22 DIAGNOSIS — J449 Chronic obstructive pulmonary disease, unspecified: Secondary | ICD-10-CM | POA: Diagnosis not present

## 2021-01-22 DIAGNOSIS — S0990XA Unspecified injury of head, initial encounter: Secondary | ICD-10-CM | POA: Diagnosis not present

## 2021-01-22 DIAGNOSIS — F0390 Unspecified dementia without behavioral disturbance: Secondary | ICD-10-CM | POA: Diagnosis not present

## 2021-01-22 DIAGNOSIS — F17210 Nicotine dependence, cigarettes, uncomplicated: Secondary | ICD-10-CM | POA: Diagnosis not present

## 2021-01-22 DIAGNOSIS — G8929 Other chronic pain: Secondary | ICD-10-CM

## 2021-01-22 DIAGNOSIS — R638 Other symptoms and signs concerning food and fluid intake: Secondary | ICD-10-CM | POA: Diagnosis not present

## 2021-01-22 DIAGNOSIS — R42 Dizziness and giddiness: Secondary | ICD-10-CM | POA: Diagnosis not present

## 2021-01-22 DIAGNOSIS — R3915 Urgency of urination: Secondary | ICD-10-CM | POA: Diagnosis not present

## 2021-01-22 DIAGNOSIS — J941 Fibrothorax: Secondary | ICD-10-CM | POA: Diagnosis not present

## 2021-01-22 DIAGNOSIS — I509 Heart failure, unspecified: Secondary | ICD-10-CM | POA: Diagnosis not present

## 2021-01-22 DIAGNOSIS — R918 Other nonspecific abnormal finding of lung field: Secondary | ICD-10-CM | POA: Diagnosis not present

## 2021-01-22 DIAGNOSIS — I951 Orthostatic hypotension: Secondary | ICD-10-CM | POA: Diagnosis not present

## 2021-01-22 DIAGNOSIS — R110 Nausea: Secondary | ICD-10-CM | POA: Diagnosis not present

## 2021-01-22 DIAGNOSIS — N401 Enlarged prostate with lower urinary tract symptoms: Secondary | ICD-10-CM | POA: Diagnosis not present

## 2021-01-22 DIAGNOSIS — I2510 Atherosclerotic heart disease of native coronary artery without angina pectoris: Secondary | ICD-10-CM | POA: Diagnosis not present

## 2021-01-22 DIAGNOSIS — K921 Melena: Secondary | ICD-10-CM | POA: Diagnosis not present

## 2021-01-22 DIAGNOSIS — K7460 Unspecified cirrhosis of liver: Secondary | ICD-10-CM | POA: Diagnosis not present

## 2021-01-22 DIAGNOSIS — C3490 Malignant neoplasm of unspecified part of unspecified bronchus or lung: Secondary | ICD-10-CM | POA: Diagnosis not present

## 2021-01-22 DIAGNOSIS — F1010 Alcohol abuse, uncomplicated: Secondary | ICD-10-CM | POA: Diagnosis not present

## 2021-01-22 DIAGNOSIS — Z981 Arthrodesis status: Secondary | ICD-10-CM

## 2021-01-22 DIAGNOSIS — R0902 Hypoxemia: Secondary | ICD-10-CM | POA: Diagnosis not present

## 2021-01-22 DIAGNOSIS — C61 Malignant neoplasm of prostate: Secondary | ICD-10-CM | POA: Diagnosis not present

## 2021-01-22 DIAGNOSIS — Z20822 Contact with and (suspected) exposure to covid-19: Secondary | ICD-10-CM | POA: Diagnosis not present

## 2021-01-22 DIAGNOSIS — R55 Syncope and collapse: Secondary | ICD-10-CM | POA: Diagnosis not present

## 2021-01-22 DIAGNOSIS — R0602 Shortness of breath: Secondary | ICD-10-CM | POA: Diagnosis not present

## 2021-01-22 DIAGNOSIS — R079 Chest pain, unspecified: Secondary | ICD-10-CM | POA: Diagnosis not present

## 2021-01-22 NOTE — Telephone Encounter (Signed)
Only gets 20 pills per month

## 2021-01-22 NOTE — Telephone Encounter (Signed)
RX Refill:percocet Last Seen:12-27-20 Last ordered:01-14-21

## 2021-01-22 NOTE — Telephone Encounter (Signed)
Refill too soon only 20 pills per month

## 2021-01-22 NOTE — Telephone Encounter (Signed)
He only gets 20 pills to last the entire month so he cant not get a refill until 02/13/21    He must stretch them out as they are controlled pills

## 2021-01-23 DIAGNOSIS — I951 Orthostatic hypotension: Secondary | ICD-10-CM | POA: Insufficient documentation

## 2021-01-23 DIAGNOSIS — F1010 Alcohol abuse, uncomplicated: Secondary | ICD-10-CM | POA: Diagnosis not present

## 2021-01-23 DIAGNOSIS — R3915 Urgency of urination: Secondary | ICD-10-CM | POA: Diagnosis not present

## 2021-01-23 DIAGNOSIS — R638 Other symptoms and signs concerning food and fluid intake: Secondary | ICD-10-CM | POA: Diagnosis not present

## 2021-01-23 DIAGNOSIS — N401 Enlarged prostate with lower urinary tract symptoms: Secondary | ICD-10-CM | POA: Diagnosis not present

## 2021-01-23 HISTORY — DX: Orthostatic hypotension: I95.1

## 2021-01-23 NOTE — Telephone Encounter (Signed)
Left message to return call 

## 2021-01-24 DIAGNOSIS — F1020 Alcohol dependence, uncomplicated: Secondary | ICD-10-CM | POA: Diagnosis not present

## 2021-01-24 DIAGNOSIS — Z8546 Personal history of malignant neoplasm of prostate: Secondary | ICD-10-CM | POA: Diagnosis not present

## 2021-01-24 DIAGNOSIS — F1611 Hallucinogen abuse, in remission: Secondary | ICD-10-CM | POA: Diagnosis not present

## 2021-01-24 DIAGNOSIS — F1220 Cannabis dependence, uncomplicated: Secondary | ICD-10-CM | POA: Diagnosis not present

## 2021-01-24 DIAGNOSIS — J439 Emphysema, unspecified: Secondary | ICD-10-CM | POA: Diagnosis not present

## 2021-01-24 DIAGNOSIS — Z923 Personal history of irradiation: Secondary | ICD-10-CM | POA: Diagnosis not present

## 2021-01-24 DIAGNOSIS — E785 Hyperlipidemia, unspecified: Secondary | ICD-10-CM | POA: Diagnosis not present

## 2021-01-24 DIAGNOSIS — D692 Other nonthrombocytopenic purpura: Secondary | ICD-10-CM | POA: Diagnosis not present

## 2021-01-24 DIAGNOSIS — Z85118 Personal history of other malignant neoplasm of bronchus and lung: Secondary | ICD-10-CM | POA: Diagnosis not present

## 2021-01-24 DIAGNOSIS — F1411 Cocaine abuse, in remission: Secondary | ICD-10-CM | POA: Diagnosis not present

## 2021-01-24 DIAGNOSIS — Z720 Tobacco use: Secondary | ICD-10-CM | POA: Diagnosis not present

## 2021-01-24 DIAGNOSIS — R55 Syncope and collapse: Secondary | ICD-10-CM | POA: Diagnosis not present

## 2021-01-24 DIAGNOSIS — F0390 Unspecified dementia without behavioral disturbance: Secondary | ICD-10-CM | POA: Diagnosis not present

## 2021-01-24 DIAGNOSIS — F331 Major depressive disorder, recurrent, moderate: Secondary | ICD-10-CM | POA: Diagnosis not present

## 2021-01-24 DIAGNOSIS — I509 Heart failure, unspecified: Secondary | ICD-10-CM | POA: Diagnosis not present

## 2021-01-24 DIAGNOSIS — E261 Secondary hyperaldosteronism: Secondary | ICD-10-CM | POA: Diagnosis not present

## 2021-01-24 DIAGNOSIS — Z6832 Body mass index (BMI) 32.0-32.9, adult: Secondary | ICD-10-CM | POA: Diagnosis not present

## 2021-01-24 DIAGNOSIS — R42 Dizziness and giddiness: Secondary | ICD-10-CM | POA: Diagnosis not present

## 2021-01-24 DIAGNOSIS — I25118 Atherosclerotic heart disease of native coronary artery with other forms of angina pectoris: Secondary | ICD-10-CM | POA: Diagnosis not present

## 2021-01-24 NOTE — Telephone Encounter (Signed)
Pt called to ask about refill on Percocet

## 2021-01-28 ENCOUNTER — Other Ambulatory Visit: Payer: Self-pay | Admitting: Internal Medicine

## 2021-01-28 DIAGNOSIS — F1210 Cannabis abuse, uncomplicated: Secondary | ICD-10-CM

## 2021-01-28 DIAGNOSIS — F1010 Alcohol abuse, uncomplicated: Secondary | ICD-10-CM

## 2021-01-28 DIAGNOSIS — G8929 Other chronic pain: Secondary | ICD-10-CM

## 2021-01-28 DIAGNOSIS — R55 Syncope and collapse: Secondary | ICD-10-CM

## 2021-01-28 IMAGING — DX DG CHEST 2V
2 series · 2 of 2 positions shown · non-contrast
Comparison: 11/13/2019

CLINICAL DATA: Shortness of breath. Status left lower lobe wedge
resection 5 days ago.

EXAM:
CHEST - 2 VIEW

[x chest ap]
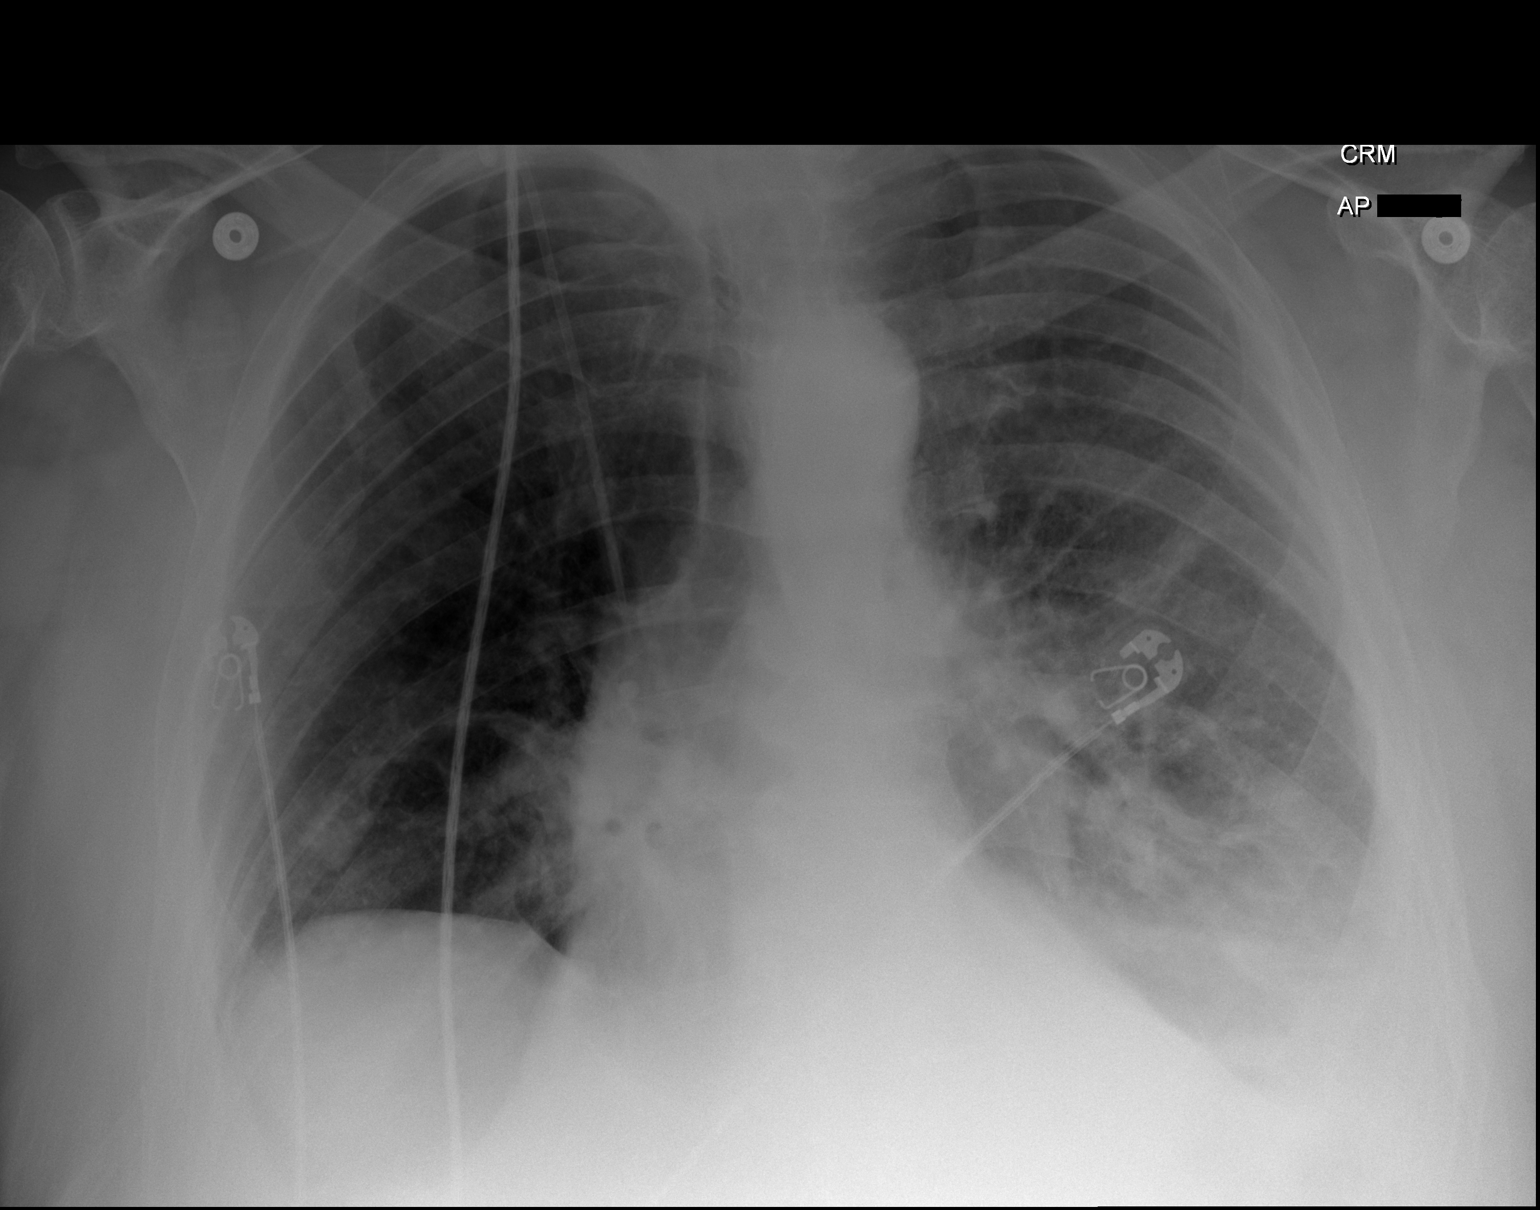

[w chest lat]
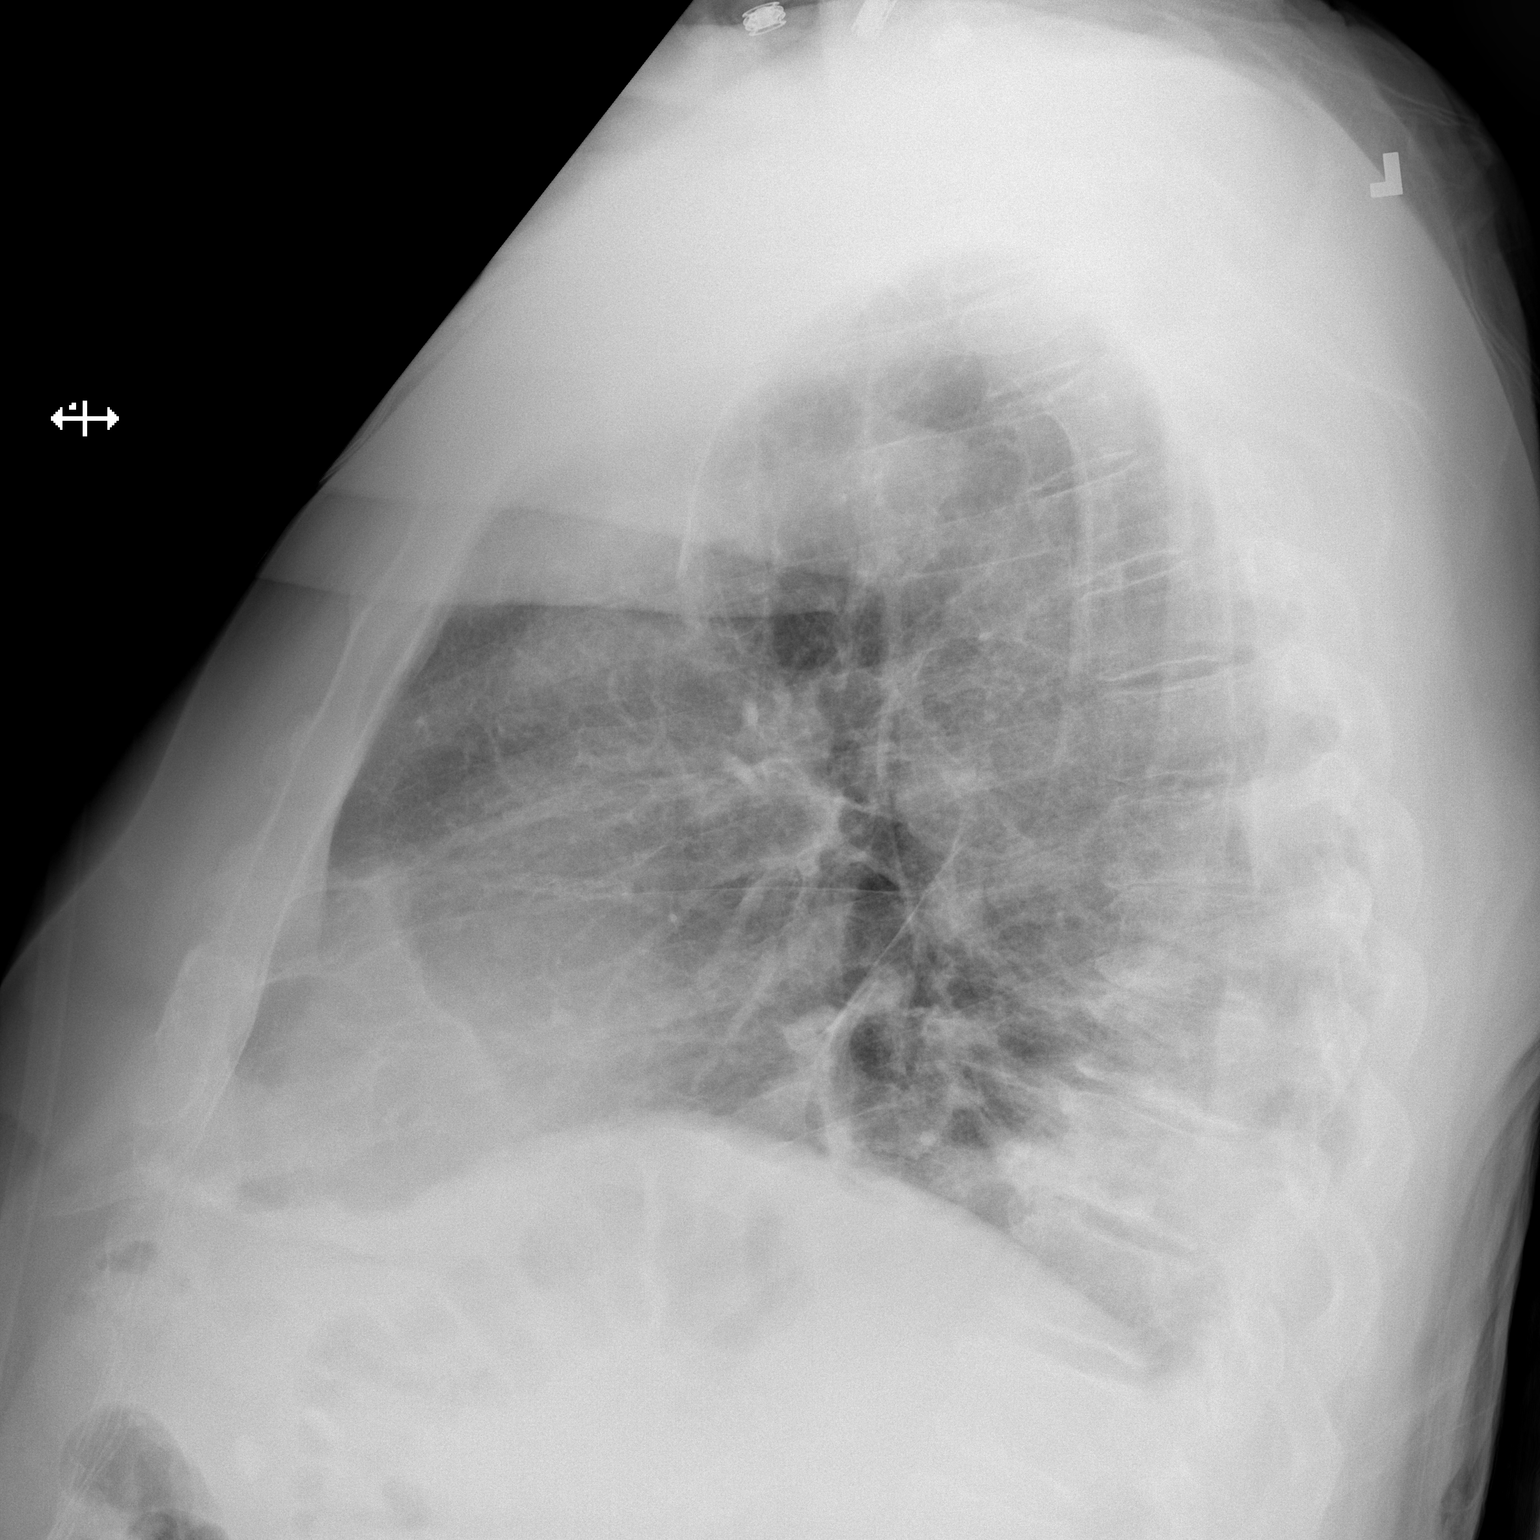

[2 of 2 positions shown; findings below may reference images not displayed]

FINDINGS: Stable right jugular catheter. Increased patchy and linear density
in the left lower lung zone. Probable mild increase in a small to
moderate-sized left pleural effusion. Interval resolved patchy
density at the right lung base with a small amount of right basilar
linear density remaining. The cardiac silhouette remains borderline
enlarged. Thoracic spine degenerative changes. No pneumothorax seen.
IMPRESSION: 1. Increased left basilar atelectasis and possible pneumonia.
2. Probable mild increase in a small to moderate-sized left pleural
effusion.
3. Significantly improved right basilar atelectasis.

## 2021-01-29 ENCOUNTER — Ambulatory Visit (INDEPENDENT_AMBULATORY_CARE_PROVIDER_SITE_OTHER): Payer: Medicare HMO | Admitting: Internal Medicine

## 2021-01-29 ENCOUNTER — Other Ambulatory Visit: Payer: Self-pay

## 2021-01-29 ENCOUNTER — Encounter: Payer: Self-pay | Admitting: Internal Medicine

## 2021-01-29 VITALS — BP 106/68 | HR 67 | Temp 98.0°F | Ht 72.0 in | Wt 247.2 lb

## 2021-01-29 DIAGNOSIS — K648 Other hemorrhoids: Secondary | ICD-10-CM

## 2021-01-29 DIAGNOSIS — I2510 Atherosclerotic heart disease of native coronary artery without angina pectoris: Secondary | ICD-10-CM

## 2021-01-29 DIAGNOSIS — R001 Bradycardia, unspecified: Secondary | ICD-10-CM | POA: Insufficient documentation

## 2021-01-29 DIAGNOSIS — F319 Bipolar disorder, unspecified: Secondary | ICD-10-CM | POA: Insufficient documentation

## 2021-01-29 DIAGNOSIS — G8929 Other chronic pain: Secondary | ICD-10-CM | POA: Diagnosis not present

## 2021-01-29 DIAGNOSIS — F1210 Cannabis abuse, uncomplicated: Secondary | ICD-10-CM | POA: Diagnosis not present

## 2021-01-29 DIAGNOSIS — R413 Other amnesia: Secondary | ICD-10-CM | POA: Diagnosis not present

## 2021-01-29 DIAGNOSIS — F339 Major depressive disorder, recurrent, unspecified: Secondary | ICD-10-CM | POA: Diagnosis not present

## 2021-01-29 DIAGNOSIS — F1010 Alcohol abuse, uncomplicated: Secondary | ICD-10-CM

## 2021-01-29 DIAGNOSIS — R42 Dizziness and giddiness: Secondary | ICD-10-CM | POA: Insufficient documentation

## 2021-01-29 DIAGNOSIS — R55 Syncope and collapse: Secondary | ICD-10-CM | POA: Diagnosis not present

## 2021-01-29 HISTORY — DX: Syncope and collapse: R55

## 2021-01-29 HISTORY — DX: Bradycardia, unspecified: R00.1

## 2021-01-29 HISTORY — DX: Other chronic pain: G89.29

## 2021-01-29 HISTORY — DX: Other hemorrhoids: K64.8

## 2021-01-29 NOTE — Progress Notes (Signed)
Chief Complaint  Patient presents with  . Hospitalization Follow-up   [facility] 01/22/21 for syncope ,SB CAD s/p stent in LAD trop neg x 1 01/22/21, dark stools 10/04/19 colonoscopy +hemorrhoids and c/w etoh abuse and thc abuse CTA chest negative heart enzyme normal, BAC undetectable CXR, EKD + SB, CT head:  There is no midline shift. No mass lesion. There is no evidence of acute infarct. No acute intracranial hemorrhage. There are scattered and confluent periventricular and subcortical hypodense foci that are non-specific, but likely consistent with chronic microvascular ischemic disease. There is generalized cerebral volume loss with sulcal prominence and ex vacuo ventricular dilation. No fractures are evident. The sinuses are pneumatized.   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  HENT:  Negative for hearing loss.   Eyes:  Negative for blurred vision.  Respiratory:  Negative for shortness of breath.   Cardiovascular:  Negative for chest pain.  Gastrointestinal:  Negative for abdominal pain.  Skin:  Negative for rash.  Neurological:  Positive for dizziness and loss of consciousness.  Psychiatric/Behavioral:  Positive for depression and memory loss.   Past Medical History:  Diagnosis Date  . Alcohol abuse    pt states it is marijuana  . Anemia   . Arthritis   . Bipolar disorder ([place])   . BPH (benign prostatic hypertrophy) with urinary obstruction   . CAD (coronary artery disease)    a. s/p stent to LAD in 2006 at [facility], EF 60% at that time.  . Cancer [facility])    prostate  . CHF (congestive  heart failure) ([place])   . Chronic pancreatitis ([place])   . Cirrhosis ([place])   . COPD (chronic obstructive pulmonary disease) ([place])   . COVID-19    08/22/20  . Dementia ([place])    early dementia  . Depression   . Diverticulitis    12/06/20 [facility]   . Diverticulosis   . Drug use   . Dyspnea   . ED (erectile dysfunction)   . Gastritis   . GERD (gastroesophageal reflux disease)   . Headache    occasional migraines  . Hepatitis C    treated  . History of lumbar fusion   . Hyperlipidemia   . Hypertension    patient denies having high blood pressure  . Lung cancer ([place])   . Mitral regurgitation   . Pancreatitis    x 2   . Pneumonia    07/06/18   . PTSD (post-traumatic stress disorder)   . Rib fracture    s/p fall 03/15/19   . Sinus disease   . Urge incontinence    Past Surgical History:  Procedure Laterality Date  . CARDIAC CATHETERIZATION  2006  . cateract  2013  . COLONOSCOPY WITH PROPOFOL N/A 10/04/2019   Procedure: COLONOSCOPY WITH PROPOFOL;  Surgeon: Lucilla Lame, MD;  Location[facility] ENDOSCOPY;  Service: Endoscopy;  Laterality: N/A;  . CORONARY ANGIOPLASTY  2006  . CORONARY STENT PLACEMENT  2006   x 1  . ESOPHAGOGASTRODUODENOSCOPY N/A 12/22/2014   Procedure: ESOPHAGOGASTRODUODENOSCOPY (  EGD);  Surgeon: [name], MD;  Location: [facility] ENDOSCOPY;  Service: Endoscopy;  Laterality: N/A;  . ESOPHAGOGASTRODUODENOSCOPY (EGD) WITH PROPOFOL N/A 10/04/2019   Procedure: ESOPHAGOGASTRODUODENOSCOPY (EGD) WITH PROPOFOL;  Surgeon: Lucilla Lame, MD;  Location: [facility] ENDOSCOPY;  Service: Endoscopy;  Laterality: N/A;  . HERNIA REPAIR  2015   left groin  . INTERCOSTAL NERVE BLOCK Left 11/09/2019   Procedure: Intercostal Nerve Block;  Surgeon: Melrose Nakayama, MD;  Location: [place];  Service: Thoracic;  Laterality: Left;  . LUMBAR FUSION    . RADIOACTIVE SEED IMPLANT N/A 04/22/2016   Procedure: RADIOACTIVE SEED IMPLANT/BRACHYTHERAPY IMPLANT;  Surgeon: Hollice Espy, MD;  Location: [facility]  ORS;  Service: Urology;  Laterality: N/A;  . ROBOT ASSISTED INGUINAL HERNIA REPAIR Bilateral 07/06/2018   Procedure: ROBOT ASSISTED INGUINAL HERNIA REPAIR;  Surgeon: Jules Husbands, MD;  Location: [facility] ORS;  Service: General;  Laterality: Bilateral;  . TONSILLECTOMY    . UMBILICAL HERNIA REPAIR N/A 07/06/2018   Procedure: LAPAROSCOPIC ROBOT ASSISTED UMBILICAL HERNIA;  Surgeon: Jules Husbands, MD;  Location: [facility] ORS;  Service: General;  Laterality: N/A;  . XI ROBOTIC ASSISTED THORACOSCOPY- SEGMENTECTOMY Left 11/09/2019   Procedure: XI ROBOTIC ASSISTED THORACOSCOPY-WEDGE RESECTION LEFT LOWER LOBE;  Surgeon: Melrose Nakayama, MD;  Location: [facility] OR;  Service: Thoracic;  Laterality: Left;   Family History  Problem Relation Age of Onset  . Heart disease Father        Unclear details. Father passed in late 39's 2/2 cancer.  . Colon cancer Father   . Alcohol abuse Sister   . Drug abuse Sister   . Anxiety disorder Sister   . Depression Sister   . COPD Brother   . Obesity Brother   . Kidney disease Neg Hx   . Prostate cancer Neg Hx    Social History   Socioeconomic History  . Marital status: Divorced    Spouse name: Not on file  . Number of children: 4  . Years of education: Not on file  . Highest education level: GED or equivalent  Occupational History  . Occupation: Aeronautical engineer    Comment: retired  Tobacco Use  . Smoking status: Every Day    Packs/day: 1.00    Years: 3.00    Pack years: 3.00    Types: Cigarettes    Start date: 07/28/1962  . Smokeless tobacco: Never  . Tobacco comments:    started back in march. 1 pack a day.  Vaping Use  . Vaping Use: Never used  Substance and Sexual Activity  . Alcohol use: Yes    Alcohol/week: 1.0 standard drink    Types: 1 Cans of beer per week    Comment: 2-3 beers a day  . Drug use: Yes    Frequency: 7.0 times per week    Types: Marijuana    Comment: Endorsed heroin 06/2014, UDS also + benzos, opiates, THC, neg for  cocaine at that time.  Marland Kitchen Sexual activity: Not Currently  Other Topics Concern  . Not on file  Social History Narrative   Lives at home    Has kids and grandkids   Smoker x 55 years 1 ppd as of 07/2019 he did quit x 2 years    Drinks 1 pt liq qd as of 07/2019    Social Determinants of Health   Financial Resource Strain: Low Risk   . Difficulty of Paying Living Expenses: Not very hard  Food Insecurity: No Food Insecurity  . Worried About  Running Out of Food in the Last Year: Never true  . Ran Out of Food in the Last Year: Never true  Transportation Needs: No Transportation Needs  . Lack of Transportation (Medical): No  . Lack of Transportation (Non-Medical): No  Physical Activity: Not on file  Stress: No Stress Concern Present  . Feeling of Stress : Not at all  Social Connections: Not on file  Intimate Partner Violence: Not At Risk  . Fear of Current or Ex-Partner: No  . Emotionally Abused: No  . Physically Abused: No  . Sexually Abused: No   Current Meds  Medication Sig  . albuterol (PROVENTIL) (2.5 MG/3ML) 0.083% nebulizer solution TAKE 3 MLS BY NEBULIZATION EVERY 4 (FOUR) HOURS AS NEEDED FOR WHEEZING OR SHORTNESS OF BREATH.  Marland Kitchen albuterol (VENTOLIN HFA) 108 (90 Base) MCG/ACT inhaler Inhale 1-2 puffs into the lungs every 6 (six) hours as needed for wheezing or shortness of breath.   . alprazolam (XANAX) 1 MG tablet Take 1 tablet (1 mg total) by mouth as directed. 1/2 in am 1 pill qhs  . aspirin EC 81 MG tablet Take 81 mg by mouth daily.  . celecoxib (CELEBREX) 200 MG capsule TAKE 1 CAPSULE (200 MG TOTAL) BY MOUTH DAILY AFTER BREAKFAST.  Marland Kitchen clopidogrel (PLAVIX) 75 MG tablet TAKE 1 TABLET (75 MG TOTAL) BY MOUTH DAILY.  Marland Kitchen donepezil (ARICEPT) 5 MG tablet Take 1 tablet (5 mg total) by mouth at bedtime.  Marland Kitchen FLUoxetine (PROZAC) 20 MG capsule Take 60 mg by mouth daily.  . hydrOXYzine (ATARAX/VISTARIL) 25 MG tablet Take 25 mg by mouth daily as needed.  . memantine (NAMENDA) 5 MG tablet Take  1 tablet (5 mg total) by mouth 2 (two) times daily. Taking qd  . mirabegron ER (MYRBETRIQ) 50 MG TB24 tablet Take 1 tablet (50 mg total) by mouth daily.  . Multiple Vitamins-Minerals (MULTIVITAMIN WITH MINERALS) tablet Take 1 tablet by mouth daily.  . ondansetron (ZOFRAN) 4 MG tablet Take 1 tablet (4 mg total) by mouth every 8 (eight) hours as needed.  . pantoprazole (PROTONIX) 40 MG tablet Take 1 tablet (40 mg total) by mouth daily. 30 min before breakfast  . potassium chloride 20 MEQ TBCR Take 20 mEq by mouth daily.  . rosuvastatin (CRESTOR) 10 MG tablet Take 1 tablet (10 mg total) by mouth at bedtime.  . sucralfate (CARAFATE) 1 g tablet Take 1 tablet (1 g total) by mouth 4 (four) times daily -  with meals and at bedtime. Stir in [employer] of liquid and let dissolve  . SYMBICORT 160-4.5 MCG/ACT inhaler INHALE 2 PUFFS INTO THE LUNGS 2 (TWO) TIMES DAILY. RINSE MOUTH OUT  . tamsulosin (FLOMAX) 0.4 MG CAPS capsule TAKE 1 CAPSULE (0.4 MG TOTAL) BY MOUTH DAILY AFTER SUPPER.   No Known Allergies Recent Results (from the past 2160 hour(s))  Urine Culture     Status: None   Collection Time: 11/01/20  3:30 PM   Specimen: Urine   Urine  Result Value Ref Range   Urine Culture, Routine Final report    Organism ID, Bacteria No growth   Gastrointestinal Panel by PCR , Stool     Status: None   Collection Time: 11/02/20 10:30 AM   Specimen: STOOL  Result Value Ref Range   Campylobacter species NOT DETECTED NOT DETECTED   Plesimonas shigelloides NOT DETECTED NOT DETECTED   Salmonella species NOT DETECTED NOT DETECTED   Yersinia enterocolitica NOT DETECTED NOT DETECTED   Vibrio species NOT DETECTED NOT DETECTED  Vibrio cholerae NOT DETECTED NOT DETECTED   Enteroaggregative E coli (EAEC) NOT DETECTED NOT DETECTED   Enteropathogenic E coli (EPEC) NOT DETECTED NOT DETECTED   Enterotoxigenic E coli (ETEC) NOT DETECTED NOT DETECTED   Shiga like toxin producing E coli (STEC) NOT DETECTED NOT DETECTED    Shigella/Enteroinvasive E coli (EIEC) NOT DETECTED NOT DETECTED   Cryptosporidium NOT DETECTED NOT DETECTED   Cyclospora cayetanensis NOT DETECTED NOT DETECTED   Entamoeba histolytica NOT DETECTED NOT DETECTED   Giardia lamblia NOT DETECTED NOT DETECTED   Adenovirus F40/41 NOT DETECTED NOT DETECTED   Astrovirus NOT DETECTED NOT DETECTED   Norovirus GI/GII NOT DETECTED NOT DETECTED   Rotavirus A NOT DETECTED NOT DETECTED   Sapovirus (I, II, IV, and V) NOT DETECTED NOT DETECTED    Comment: Performed at [facility], [address] [place], [place] [address]  C Difficile Quick Screen w PCR reflex     Status: None   Collection Time: 11/02/20 10:30 AM   Specimen: STOOL  Result Value Ref Range   C Diff antigen NEGATIVE NEGATIVE   C Diff toxin NEGATIVE NEGATIVE   C Diff interpretation No C. difficile detected.     Comment: Performed at [facility], [place], [place] [address]  SARS CORONAVIRUS 2 (TAT 6-24 HRS) Nasopharyngeal Nasopharyngeal Swab     Status: None   Collection Time: 11/02/20 10:45 AM   Specimen: Nasopharyngeal Swab  Result Value Ref Range   SARS Coronavirus 2 NEGATIVE NEGATIVE    Comment: (NOTE) SARS-CoV-2 target nucleic acids are NOT DETECTED.  The SARS-CoV-2 RNA is generally detectable in upper and lower respiratory specimens during the acute phase of infection. Negative results do not preclude SARS-CoV-2 infection, do not rule out co-infections with other pathogens, and should not be used as the sole basis for treatment or other patient management decisions. Negative results must be combined with clinical observations, patient history, and epidemiological information. The expected result is Negative.  Fact Sheet for Patients: SugarRoll.be  Fact Sheet for Healthcare Providers: https://www.woods-mathews.com/  This test is not yet approved or cleared by the [place] FDA and  has been  authorized for detection and/or diagnosis of SARS-CoV-2 by FDA under an Emergency Use Authorization (EUA). This EUA will remain  in effect (meaning this test can be used) for the duration of the COVID-19 declaration under Se ction 564(b)(1) of the Act, 21 U.S.C. section 360bbb-3(b)(1), unless the authorization is terminated or revoked sooner.  Performed at [facility], [place] [address] [place], [place] [address]   Comprehensive metabolic panel     Status: None   Collection Time: 11/06/20  5:06 PM  Result Value Ref Range   Sodium 137 135 - 145 mmol/L   Potassium 4.2 3.5 - 5.1 mmol/L   Chloride 103 98 - 111 mmol/L   CO2 26 22 - 32 mmol/L   Glucose, Bld 82 70 - 99 mg/dL    Comment: Glucose reference range applies only to samples taken after fasting for at least 8 hours.   BUN 9 8 - 23 mg/dL   Creatinine, Ser 1.11 0.61 - 1.24 mg/dL   Calcium 9.2 8.9 - 10.3 mg/dL   Total Protein 7.3 6.5 - 8.1 g/dL   Albumin 3.7 3.5 - 5.0 g/dL   AST 23 15 - 41 U/L   ALT 21 0 - 44 U/L   Alkaline Phosphatase 78 38 - 126 U/L   Total Bilirubin 0.9 0.3 - 1.2 mg/dL  GFR, Estimated >60 >60 mL/min    Comment: (NOTE) Calculated using the CKD-EPI Creatinine Equation (2021)    Anion gap 8 5 - 15    Comment: Performed at [facility], [place] [address] [place], [place] [address]  Ethanol     Status: None   Collection Time: 11/06/20  5:06 PM  Result Value Ref Range   Alcohol, Ethyl (B) <10 <10 mg/dL    Comment: (NOTE) Lowest detectable limit for serum alcohol is 10 mg/dL.  For medical purposes only. Performed at [facility], [place] [address] [place], [place] [address]   cbc     Status: Abnormal   Collection Time: 11/06/20  5:06 PM  Result Value Ref Range   WBC 13.6 (H) 4.0 - 10.5 K/uL   RBC 3.76 (L) 4.22 - 5.81 MIL/uL   Hemoglobin 12.3 (L) 13.0 - 17.0 g/dL   HCT 37.3 (L) 39.0 - 52.0 %   MCV 99.2 80.0 - 100.0 fL   MCH 32.7 26.0 - 34.0 pg   MCHC 33.0 30.0 - 36.0 g/dL   RDW 14.3 11.5  - 15.5 %   Platelets 253 150 - 400 K/uL   nRBC 0.0 0.0 - 0.2 %    Comment: Performed at [facility], [place] [address], [place], [place] [address]  CK     Status: None   Collection Time: 11/06/20  5:06 PM  Result Value Ref Range   Total CK 195 49 - 397 U/L    Comment: Performed at [facility], [place] [address] [place], [place] [address]  Comprehensive metabolic panel     Status: Abnormal   Collection Time: 11/20/20  9:58 AM  Result Value Ref Range   Sodium 138 135 - 145 mmol/L   Potassium 4.1 3.5 - 5.1 mmol/L   Chloride 104 98 - 111 mmol/L   CO2 24 22 - 32 mmol/L   Glucose, Bld 113 (H) 70 - 99 mg/dL    Comment: Glucose reference range applies only to samples taken after fasting for at least 8 hours.   BUN 22 8 - 23 mg/dL   Creatinine, Ser 1.10 0.61 - 1.24 mg/dL   Calcium 9.0 8.9 - 10.3 mg/dL   Total Protein 7.4 6.5 - 8.1 g/dL   Albumin 3.8 3.5 - 5.0 g/dL   AST 21 15 - 41 U/L   ALT 22 0 - 44 U/L   Alkaline Phosphatase 73 38 - 126 U/L   Total Bilirubin 0.5 0.3 - 1.2 mg/dL   GFR, Estimated >60 >60 mL/min    Comment: (NOTE) Calculated using the CKD-EPI Creatinine Equation (2021)    Anion gap 10 5 - 15    Comment: Performed at [facility], [address] [place], [place] [address]  CBC with Differential     Status: Abnormal   Collection Time: 11/20/20  9:58 AM  Result Value Ref Range   WBC 9.9 4.0 - 10.5 K/uL   RBC 3.79 (L) 4.22 - 5.81 MIL/uL   Hemoglobin 12.6 (L) 13.0 - 17.0 g/dL   HCT 36.6 (L) 39.0 - 52.0 %   MCV 96.6 80.0 - 100.0 fL   MCH 33.2 26.0 - 34.0 pg   MCHC 34.4 30.0 - 36.0 g/dL   RDW 14.1 11.5 - 15.5 %   Platelets 357 150 - 400 K/uL   nRBC 0.0 0.0 - 0.2 %   Neutrophils Relative % 68 %   Neutro Abs 6.8 1.7 - 7.7  K/uL   Lymphocytes Relative 19 %   Lymphs Abs 1.9 0.7 - 4.0 K/uL   Monocytes Relative 9 %   Monocytes Absolute 0.9 0.1 - 1.0 K/uL   Eosinophils Relative 3 %   Eosinophils Absolute 0.3 0.0 - 0.5 K/uL   Basophils Relative 1 %    Basophils Absolute 0.1 0.0 - 0.1 K/uL   Immature Granulocytes 0 %   Abs Immature Granulocytes 0.04 0.00 - 0.07 K/uL    Comment: Performed at [facility], [address] [place], [place] [address]  ECHOCARDIOGRAM COMPLETE     Status: None   Collection Time: 12/18/20 11:13 AM  Result Value Ref Range   AR max vel 4.55 cm2   AV Peak grad 4.6 mmHg   Ao pk vel 1.07 m/s   S' Lateral 3.40 cm   Area-P 1/2 2.23 cm2   AV Area VTI 4.23 cm2   AV Mean grad 3.0 mmHg   Single Plane A4C EF 61.5 %   Single Plane A2C EF 54.9 %   Calc EF 58.6 %   AV Area mean vel 4.23 cm2  PSA     Status: None   Collection Time: 12/21/20 11:23 AM  Result Value Ref Range   Prostate Specific Ag, Serum 0.1 0.0 - 4.0 ng/mL    Comment: Roche ECLIA methodology. According to the American Urological Association, Serum PSA should decrease and remain at undetectable levels after radical prostatectomy. The AUA defines biochemical recurrence as an initial PSA value 0.2 ng/mL or greater followed by a subsequent confirmatory PSA value 0.2 ng/mL or greater. Values obtained with different assay methods or kits cannot be used interchangeably. Results cannot be interpreted as absolute evidence of the presence or absence of malignant disease.   Basic metabolic panel     Status: Abnormal   Collection Time: 01/21/21  1:01 PM  Result Value Ref Range   Sodium 138 135 - 145 mmol/L   Potassium 4.6 3.5 - 5.1 mmol/L   Chloride 108 98 - 111 mmol/L   CO2 26 22 - 32 mmol/L   Glucose, Bld 83 70 - 99 mg/dL    Comment: Glucose reference range applies only to samples taken after fasting for at least 8 hours.   BUN 25 (H) 8 - 23 mg/dL   Creatinine, Ser 1.22 0.61 - 1.24 mg/dL   Calcium 8.7 (L) 8.9 - 10.3 mg/dL   GFR, Estimated >60 >60 mL/min    Comment: (NOTE) Calculated using the CKD-EPI Creatinine Equation (2021)    Anion gap 4 (L) 5 - 15    Comment: Performed at [facility], [address] [place], [place] [address]   CBC     Status: Abnormal   Collection Time: 01/21/21  1:01 PM  Result Value Ref Range   WBC 8.7 4.0 - 10.5 K/uL   RBC 3.64 (L) 4.22 - 5.81 MIL/uL   Hemoglobin 12.2 (L) 13.0 - 17.0 g/dL   HCT 34.9 (L) 39.0 - 52.0 %   MCV 95.9 80.0 - 100.0 fL   MCH 33.5 26.0 - 34.0 pg   MCHC 35.0 30.0 - 36.0 g/dL   RDW 15.9 (H) 11.5 - 15.5 %   Platelets 303 150 - 400 K/uL   nRBC 0.0 0.0 - 0.2 %    Comment: Performed at [facility], [address] [place], [place] [address]   Objective  Body mass index is 33.53 kg/m. Wt Readings from Last 3 Encounters:  01/29/21 247 lb 3.2 oz (  112.1 kg)  01/21/21 245 lb 9.5 oz (111.4 kg)  12/27/20 245 lb 9.6 oz (111.4 kg)   Temp Readings from Last 3 Encounters:  01/29/21 98 F (36.7 C) (Oral)  01/21/21 98.1 F (36.7 C) (Oral)  12/27/20 97.8 F (36.6 C) (Oral)   BP Readings from Last 3 Encounters:  01/29/21 106/68  01/21/21 104/69  12/27/20 112/72   Pulse Readings from Last 3 Encounters:  01/29/21 67  01/21/21 (!) 55  12/27/20 65    Physical Exam Vitals and nursing note reviewed.  Constitutional:      Appearance: Normal appearance. He is well-developed and well-groomed.  HENT:     Head: Normocephalic and atraumatic.  Eyes:     Conjunctiva/sclera: Conjunctivae normal.     Pupils: Pupils are equal, round, and reactive to light.  Cardiovascular:     Rate and Rhythm: Normal rate and regular rhythm.     Heart sounds: Normal heart sounds. No murmur heard. Pulmonary:     Effort: Pulmonary effort is normal.     Breath sounds: Normal breath sounds.  Skin:    General: Skin is warm and dry.  Neurological:     General: No focal deficit present.     Mental Status: He is alert and oriented to person, place, and time. Mental status is at baseline.     Gait: Gait normal.  Psychiatric:        Attention and Perception: Attention and perception normal.        Mood and Affect: Mood and affect normal.        Speech: Speech normal.         Behavior: Behavior normal. Behavior is cooperative.        Thought Content: Thought content normal.        Cognition and Memory: Cognition and memory normal.        Judgment: Judgment normal.    Assessment  Plan  Syncope, unspecified syncope type could be cardiac, neuro related or substance - Plan: [id number] 7+Alc-Unbund Dizziness Refer to neuro and cards may need zio  Sinus bradycardia Memory loss f/u with neurology  Coronary artery disease involving native coronary artery of native heart without angina pectoris F/u with cards   Alcohol abuse - Plan: [id number] 7+Alc-Unbund Tetrahydrocannabinol (THC) use disorder, mild, abuse - Plan: 801655 7+Alc-Unbund Other chronic pain - Plan: [id number] 7+Alc-Unbund Terminated pain contract due to above  Depression, recurrent ([place]) Needs to f/u with [facility] psych and therapy  Bipolar affective disorder, remission status unspecified ([place])  Provider: Dr. Olivia Mackie McLean-Scocuzza-Internal Medicine

## 2021-01-29 NOTE — Patient Instructions (Addendum)
Lidocaine pain patch or aspercream with lidocaine  Heating pad  Tylenol   No metoprolol/Toprol XL or other blood pressure medications or fluid  pills lasix

## 2021-01-30 ENCOUNTER — Ambulatory Visit: Payer: Self-pay | Admitting: Urology

## 2021-02-01 DIAGNOSIS — F32A Depression, unspecified: Secondary | ICD-10-CM | POA: Diagnosis not present

## 2021-02-01 DIAGNOSIS — F0390 Unspecified dementia without behavioral disturbance: Secondary | ICD-10-CM | POA: Diagnosis not present

## 2021-02-01 DIAGNOSIS — M5416 Radiculopathy, lumbar region: Secondary | ICD-10-CM | POA: Diagnosis not present

## 2021-02-01 DIAGNOSIS — F419 Anxiety disorder, unspecified: Secondary | ICD-10-CM | POA: Diagnosis not present

## 2021-02-01 DIAGNOSIS — M79605 Pain in left leg: Secondary | ICD-10-CM | POA: Diagnosis not present

## 2021-02-05 ENCOUNTER — Other Ambulatory Visit: Payer: Self-pay | Admitting: *Deleted

## 2021-02-05 DIAGNOSIS — C3492 Malignant neoplasm of unspecified part of left bronchus or lung: Secondary | ICD-10-CM

## 2021-02-05 DIAGNOSIS — Z08 Encounter for follow-up examination after completed treatment for malignant neoplasm: Secondary | ICD-10-CM

## 2021-02-05 DIAGNOSIS — Z85118 Personal history of other malignant neoplasm of bronchus and lung: Secondary | ICD-10-CM

## 2021-02-05 LAB — 726778 7+ALC-UNBUND
Amphetamines, Urine: NEGATIVE ng/mL
Barbiturate Quant, Ur: NEGATIVE ng/mL
Benzodiazepine Quant, Ur: NEGATIVE
Cannabinoid Quant, Ur: POSITIVE — AB
Cocaine (Metab.): NEGATIVE ng/mL
Ethanol, Urine: NEGATIVE %
Opiate Quant, Ur: NEGATIVE ng/mL
PCP Quant, Ur: NEGATIVE ng/mL

## 2021-02-06 DIAGNOSIS — M1990 Unspecified osteoarthritis, unspecified site: Secondary | ICD-10-CM | POA: Diagnosis not present

## 2021-02-06 DIAGNOSIS — K648 Other hemorrhoids: Secondary | ICD-10-CM | POA: Diagnosis not present

## 2021-02-06 DIAGNOSIS — F1010 Alcohol abuse, uncomplicated: Secondary | ICD-10-CM | POA: Diagnosis not present

## 2021-02-06 DIAGNOSIS — I509 Heart failure, unspecified: Secondary | ICD-10-CM | POA: Diagnosis not present

## 2021-02-06 DIAGNOSIS — I110 Hypertensive heart disease with heart failure: Secondary | ICD-10-CM | POA: Diagnosis not present

## 2021-02-06 DIAGNOSIS — D649 Anemia, unspecified: Secondary | ICD-10-CM | POA: Diagnosis not present

## 2021-02-06 DIAGNOSIS — I2510 Atherosclerotic heart disease of native coronary artery without angina pectoris: Secondary | ICD-10-CM | POA: Diagnosis not present

## 2021-02-06 DIAGNOSIS — Q7649 Other congenital malformations of spine, not associated with scoliosis: Secondary | ICD-10-CM | POA: Diagnosis not present

## 2021-02-06 DIAGNOSIS — F319 Bipolar disorder, unspecified: Secondary | ICD-10-CM | POA: Diagnosis not present

## 2021-02-08 ENCOUNTER — Telehealth: Payer: Self-pay | Admitting: Internal Medicine

## 2021-02-08 NOTE — Telephone Encounter (Signed)
Lft pt vm to call ofc to follow up on referral to [facility].thanks

## 2021-02-13 ENCOUNTER — Other Ambulatory Visit: Payer: Self-pay | Admitting: Urology

## 2021-02-13 ENCOUNTER — Telehealth: Payer: Self-pay | Admitting: Internal Medicine

## 2021-02-13 DIAGNOSIS — I2510 Atherosclerotic heart disease of native coronary artery without angina pectoris: Secondary | ICD-10-CM | POA: Diagnosis not present

## 2021-02-13 DIAGNOSIS — F319 Bipolar disorder, unspecified: Secondary | ICD-10-CM | POA: Diagnosis not present

## 2021-02-13 DIAGNOSIS — F1010 Alcohol abuse, uncomplicated: Secondary | ICD-10-CM | POA: Diagnosis not present

## 2021-02-13 DIAGNOSIS — D649 Anemia, unspecified: Secondary | ICD-10-CM | POA: Diagnosis not present

## 2021-02-13 DIAGNOSIS — I110 Hypertensive heart disease with heart failure: Secondary | ICD-10-CM | POA: Diagnosis not present

## 2021-02-13 DIAGNOSIS — Q7649 Other congenital malformations of spine, not associated with scoliosis: Secondary | ICD-10-CM | POA: Diagnosis not present

## 2021-02-13 DIAGNOSIS — I509 Heart failure, unspecified: Secondary | ICD-10-CM | POA: Diagnosis not present

## 2021-02-13 DIAGNOSIS — M1990 Unspecified osteoarthritis, unspecified site: Secondary | ICD-10-CM | POA: Diagnosis not present

## 2021-02-13 DIAGNOSIS — K648 Other hemorrhoids: Secondary | ICD-10-CM | POA: Diagnosis not present

## 2021-02-13 NOTE — Telephone Encounter (Signed)
Lisa from [facility] called regarding the patients medicines and would like to know if he is supposed to be taking potassium chloride 20 MEQ TBCR and aracept.If the pt is supposed to take these medicines she stated he will need a perscription sent to his pharmacy which is [facility] [address] in [place].

## 2021-02-14 NOTE — Telephone Encounter (Signed)
Please advise, Potassium last filled on 11/14/20 Aricept last filled on 08/15/2019.

## 2021-02-18 ENCOUNTER — Other Ambulatory Visit: Payer: Self-pay | Admitting: Internal Medicine

## 2021-02-18 DIAGNOSIS — R4189 Other symptoms and signs involving cognitive functions and awareness: Secondary | ICD-10-CM

## 2021-02-18 MED ORDER — DONEPEZIL HCL 5 MG PO TABS: 5.0000 mg | ORAL_TABLET | Freq: Every day | ORAL | 3 refills | Status: DC

## 2021-02-18 NOTE — Telephone Encounter (Signed)
Sent aricept further refills on memory meds per neurology/tx  No longer needed potassium   Dr. Gayland Curry

## 2021-02-20 ENCOUNTER — Ambulatory Visit: Payer: Self-pay | Admitting: Urology

## 2021-02-20 DIAGNOSIS — J449 Chronic obstructive pulmonary disease, unspecified: Secondary | ICD-10-CM | POA: Diagnosis not present

## 2021-02-20 DIAGNOSIS — I340 Nonrheumatic mitral (valve) insufficiency: Secondary | ICD-10-CM | POA: Diagnosis not present

## 2021-02-20 DIAGNOSIS — I2510 Atherosclerotic heart disease of native coronary artery without angina pectoris: Secondary | ICD-10-CM | POA: Diagnosis not present

## 2021-02-20 DIAGNOSIS — M5116 Intervertebral disc disorders with radiculopathy, lumbar region: Secondary | ICD-10-CM | POA: Diagnosis not present

## 2021-02-20 DIAGNOSIS — F4310 Post-traumatic stress disorder, unspecified: Secondary | ICD-10-CM | POA: Diagnosis not present

## 2021-02-20 DIAGNOSIS — K219 Gastro-esophageal reflux disease without esophagitis: Secondary | ICD-10-CM | POA: Diagnosis not present

## 2021-02-20 DIAGNOSIS — K5790 Diverticulosis of intestine, part unspecified, without perforation or abscess without bleeding: Secondary | ICD-10-CM | POA: Diagnosis not present

## 2021-02-20 DIAGNOSIS — F0390 Unspecified dementia without behavioral disturbance: Secondary | ICD-10-CM | POA: Diagnosis not present

## 2021-02-20 DIAGNOSIS — F418 Other specified anxiety disorders: Secondary | ICD-10-CM | POA: Diagnosis not present

## 2021-02-21 DIAGNOSIS — F1010 Alcohol abuse, uncomplicated: Secondary | ICD-10-CM | POA: Diagnosis not present

## 2021-02-21 DIAGNOSIS — D649 Anemia, unspecified: Secondary | ICD-10-CM | POA: Diagnosis not present

## 2021-02-21 DIAGNOSIS — M1990 Unspecified osteoarthritis, unspecified site: Secondary | ICD-10-CM | POA: Diagnosis not present

## 2021-02-21 DIAGNOSIS — K648 Other hemorrhoids: Secondary | ICD-10-CM | POA: Diagnosis not present

## 2021-02-21 DIAGNOSIS — I110 Hypertensive heart disease with heart failure: Secondary | ICD-10-CM | POA: Diagnosis not present

## 2021-02-21 DIAGNOSIS — Q7649 Other congenital malformations of spine, not associated with scoliosis: Secondary | ICD-10-CM | POA: Diagnosis not present

## 2021-02-21 DIAGNOSIS — F319 Bipolar disorder, unspecified: Secondary | ICD-10-CM | POA: Diagnosis not present

## 2021-02-21 DIAGNOSIS — I509 Heart failure, unspecified: Secondary | ICD-10-CM | POA: Diagnosis not present

## 2021-02-21 DIAGNOSIS — I2510 Atherosclerotic heart disease of native coronary artery without angina pectoris: Secondary | ICD-10-CM | POA: Diagnosis not present

## 2021-02-22 ENCOUNTER — Encounter: Payer: Self-pay | Admitting: Urology

## 2021-02-26 DIAGNOSIS — M1990 Unspecified osteoarthritis, unspecified site: Secondary | ICD-10-CM | POA: Diagnosis not present

## 2021-02-26 DIAGNOSIS — K648 Other hemorrhoids: Secondary | ICD-10-CM | POA: Diagnosis not present

## 2021-02-26 DIAGNOSIS — I509 Heart failure, unspecified: Secondary | ICD-10-CM | POA: Diagnosis not present

## 2021-02-26 DIAGNOSIS — F319 Bipolar disorder, unspecified: Secondary | ICD-10-CM | POA: Diagnosis not present

## 2021-02-26 DIAGNOSIS — I110 Hypertensive heart disease with heart failure: Secondary | ICD-10-CM | POA: Diagnosis not present

## 2021-02-26 DIAGNOSIS — Q7649 Other congenital malformations of spine, not associated with scoliosis: Secondary | ICD-10-CM | POA: Diagnosis not present

## 2021-02-26 DIAGNOSIS — D649 Anemia, unspecified: Secondary | ICD-10-CM | POA: Diagnosis not present

## 2021-02-26 DIAGNOSIS — F1010 Alcohol abuse, uncomplicated: Secondary | ICD-10-CM | POA: Diagnosis not present

## 2021-02-26 DIAGNOSIS — I2510 Atherosclerotic heart disease of native coronary artery without angina pectoris: Secondary | ICD-10-CM | POA: Diagnosis not present

## 2021-02-27 DIAGNOSIS — K648 Other hemorrhoids: Secondary | ICD-10-CM | POA: Diagnosis not present

## 2021-02-27 DIAGNOSIS — M1990 Unspecified osteoarthritis, unspecified site: Secondary | ICD-10-CM | POA: Diagnosis not present

## 2021-02-27 DIAGNOSIS — I2510 Atherosclerotic heart disease of native coronary artery without angina pectoris: Secondary | ICD-10-CM | POA: Diagnosis not present

## 2021-02-27 DIAGNOSIS — F1010 Alcohol abuse, uncomplicated: Secondary | ICD-10-CM | POA: Diagnosis not present

## 2021-02-27 DIAGNOSIS — Q7649 Other congenital malformations of spine, not associated with scoliosis: Secondary | ICD-10-CM | POA: Diagnosis not present

## 2021-02-27 DIAGNOSIS — F319 Bipolar disorder, unspecified: Secondary | ICD-10-CM | POA: Diagnosis not present

## 2021-02-27 DIAGNOSIS — D649 Anemia, unspecified: Secondary | ICD-10-CM | POA: Diagnosis not present

## 2021-02-27 DIAGNOSIS — I509 Heart failure, unspecified: Secondary | ICD-10-CM | POA: Diagnosis not present

## 2021-02-27 DIAGNOSIS — I110 Hypertensive heart disease with heart failure: Secondary | ICD-10-CM | POA: Diagnosis not present

## 2021-02-28 DIAGNOSIS — M5116 Intervertebral disc disorders with radiculopathy, lumbar region: Secondary | ICD-10-CM | POA: Diagnosis not present

## 2021-02-28 DIAGNOSIS — F4310 Post-traumatic stress disorder, unspecified: Secondary | ICD-10-CM | POA: Diagnosis not present

## 2021-02-28 DIAGNOSIS — I340 Nonrheumatic mitral (valve) insufficiency: Secondary | ICD-10-CM | POA: Diagnosis not present

## 2021-02-28 DIAGNOSIS — F418 Other specified anxiety disorders: Secondary | ICD-10-CM | POA: Diagnosis not present

## 2021-02-28 DIAGNOSIS — F0390 Unspecified dementia without behavioral disturbance: Secondary | ICD-10-CM | POA: Diagnosis not present

## 2021-02-28 DIAGNOSIS — K5790 Diverticulosis of intestine, part unspecified, without perforation or abscess without bleeding: Secondary | ICD-10-CM | POA: Diagnosis not present

## 2021-02-28 DIAGNOSIS — J449 Chronic obstructive pulmonary disease, unspecified: Secondary | ICD-10-CM | POA: Diagnosis not present

## 2021-02-28 DIAGNOSIS — I2510 Atherosclerotic heart disease of native coronary artery without angina pectoris: Secondary | ICD-10-CM | POA: Diagnosis not present

## 2021-02-28 DIAGNOSIS — K219 Gastro-esophageal reflux disease without esophagitis: Secondary | ICD-10-CM | POA: Diagnosis not present

## 2021-03-05 DIAGNOSIS — I110 Hypertensive heart disease with heart failure: Secondary | ICD-10-CM | POA: Diagnosis not present

## 2021-03-05 DIAGNOSIS — F1010 Alcohol abuse, uncomplicated: Secondary | ICD-10-CM | POA: Diagnosis not present

## 2021-03-05 DIAGNOSIS — Q7649 Other congenital malformations of spine, not associated with scoliosis: Secondary | ICD-10-CM | POA: Diagnosis not present

## 2021-03-05 DIAGNOSIS — F319 Bipolar disorder, unspecified: Secondary | ICD-10-CM | POA: Diagnosis not present

## 2021-03-05 DIAGNOSIS — I509 Heart failure, unspecified: Secondary | ICD-10-CM | POA: Diagnosis not present

## 2021-03-05 DIAGNOSIS — M1990 Unspecified osteoarthritis, unspecified site: Secondary | ICD-10-CM | POA: Diagnosis not present

## 2021-03-05 DIAGNOSIS — K648 Other hemorrhoids: Secondary | ICD-10-CM | POA: Diagnosis not present

## 2021-03-05 DIAGNOSIS — I2510 Atherosclerotic heart disease of native coronary artery without angina pectoris: Secondary | ICD-10-CM | POA: Diagnosis not present

## 2021-03-05 DIAGNOSIS — D649 Anemia, unspecified: Secondary | ICD-10-CM | POA: Diagnosis not present

## 2021-03-08 DIAGNOSIS — I509 Heart failure, unspecified: Secondary | ICD-10-CM | POA: Diagnosis not present

## 2021-03-08 DIAGNOSIS — F319 Bipolar disorder, unspecified: Secondary | ICD-10-CM | POA: Diagnosis not present

## 2021-03-08 DIAGNOSIS — D649 Anemia, unspecified: Secondary | ICD-10-CM | POA: Diagnosis not present

## 2021-03-08 DIAGNOSIS — I2510 Atherosclerotic heart disease of native coronary artery without angina pectoris: Secondary | ICD-10-CM | POA: Diagnosis not present

## 2021-03-08 DIAGNOSIS — K648 Other hemorrhoids: Secondary | ICD-10-CM | POA: Diagnosis not present

## 2021-03-08 DIAGNOSIS — Q7649 Other congenital malformations of spine, not associated with scoliosis: Secondary | ICD-10-CM | POA: Diagnosis not present

## 2021-03-08 DIAGNOSIS — M1990 Unspecified osteoarthritis, unspecified site: Secondary | ICD-10-CM | POA: Diagnosis not present

## 2021-03-08 DIAGNOSIS — I110 Hypertensive heart disease with heart failure: Secondary | ICD-10-CM | POA: Diagnosis not present

## 2021-03-08 DIAGNOSIS — F1010 Alcohol abuse, uncomplicated: Secondary | ICD-10-CM | POA: Diagnosis not present

## 2021-03-12 NOTE — Progress Notes (Signed)
Cardiology Office Note:    Date:  03/13/2021   ID:  Christopher Orr, DOB [birth year], MRN [id number]  PCP:  McLean-Scocuzza, [name], MD  [facility] Cardiologist:  Kathlyn Sacramento, MD  [facility] Electrophysiologist:  None   Referring MD: McLean-Scocuzza, Olivia Mackie *   Chief Complaint: syncope/dizziness follow-up  History of Present Illness:    Christopher Orr is a 75 y.o. male with a hx of CAD s/p LAD stenting in 2006 it Cypher DES, tobacco/marijuana, alcohol use, COPD, HLD, obesity, and hepatitis C (treated), mild dementia who presents for follow-up.    Repat cath in 2011 showed patent stent with mild restenosis. CT in 2019 that showed no aortic aneuryms. Patient was diagnosed with squamous cell cancer of the lung in 2021  and underwent robotic assisted wedge resection. He did not require chemo or radiation. Plans to follow up with oncology. Patient was seen 02/2020 and was stable from a cardiac perspective. Plan to indefinitley continue DAPT with Aspirin and plavix.   Referred by PCP for dizziness and seen 12/21/20 Toprol was decreased. Lasix was discontinued. Echo was obtained which LVEF 50-55%, G1DD and no significant valvular abnormalities.   Today, the patient reports his son died 2 weeks ago of a heart attack and he has been drunk most days for the last 2 weeks. Hasn't been eating much. Reported he was taking his medications. He had been seeing a therapist for 10 years, they recently said they can't see [employer]. He was on multiple psych medications and is looking for another therapist. No chest pain. He has SOB from COPD, it is unchanged. Still has occasional dizzy spells, but it's much better now. No LLE, orthopnea, pnd, palpitations. Looks like BB stopped for bradycardia/orthostasis.   I reviewed PCP note and he recorded a syncope event. 2 months ago the patient passed out at home. He Got up and fell out on the floor. Went to the ER but work-up was unremarkable. Still  drinking alcohol daily. Also smoking tobacco and weed. Orthostatics today negative.   Past Medical History:  Diagnosis Date   Alcohol abuse    pt states it is marijuana   Anemia    Arthritis    Bipolar disorder (HCC)    BPH (benign prostatic hypertrophy) with urinary obstruction    CAD (coronary artery disease)    a. s/p stent to LAD in 2006 at [facility], EF 60% at that time.   [facility])    prostate   CHF (congestive heart failure) (HCC)    Chronic pancreatitis (HCC)    Cirrhosis (HCC)    COPD (chronic obstructive pulmonary disease) ([place])    COVID-19    08/22/20   Dementia ([place])    early dementia   Depression    Diverticulitis    12/06/20 [facility]    Diverticulosis    Drug use    Dyspnea    ED (erectile dysfunction)    Gastritis    GERD (gastroesophageal reflux disease)    Headache    occasional migraines   Hepatitis C    treated   History of lumbar fusion    Hyperlipidemia    Hypertension    patient denies having high blood pressure   Lung cancer ([place])    Mitral regurgitation    Pancreatitis    x 2    Pneumonia    07/06/18    PTSD (post-traumatic stress disorder)    Rib fracture    s/p fall 03/15/19  Sinus disease    Urge incontinence     Past Surgical History:  Procedure Laterality Date   CARDIAC CATHETERIZATION  2006   cateract  2013   COLONOSCOPY WITH PROPOFOL N/A 10/04/2019   Procedure: COLONOSCOPY WITH PROPOFOL;  Surgeon: Lucilla Lame, MD;  Location: [facility] ENDOSCOPY;  Service: Endoscopy;  Laterality: N/A;   CORONARY ANGIOPLASTY  2006   CORONARY STENT PLACEMENT  2006   x 1   ESOPHAGOGASTRODUODENOSCOPY N/A 12/22/2014   Procedure: ESOPHAGOGASTRODUODENOSCOPY (EGD);  Surgeon: [name], MD;  Location: [facility] ENDOSCOPY;  Service: Endoscopy;  Laterality: N/A;   ESOPHAGOGASTRODUODENOSCOPY (EGD) WITH PROPOFOL N/A 10/04/2019   Procedure: ESOPHAGOGASTRODUODENOSCOPY (EGD) WITH PROPOFOL;  Surgeon: Lucilla Lame, MD;  Location: [facility] ENDOSCOPY;  Service: Endoscopy;   Laterality: N/A;   HERNIA REPAIR  2015   left groin   INTERCOSTAL NERVE BLOCK Left 11/09/2019   Procedure: Intercostal Nerve Block;  Surgeon: Melrose Nakayama, MD;  Location: [place];  Service: Thoracic;  Laterality: Left;   LUMBAR FUSION     RADIOACTIVE SEED IMPLANT N/A 04/22/2016   Procedure: RADIOACTIVE SEED IMPLANT/BRACHYTHERAPY IMPLANT;  Surgeon: Hollice Espy, MD;  Location: [facility] ORS;  Service: Urology;  Laterality: N/A;   ROBOT ASSISTED INGUINAL HERNIA REPAIR Bilateral 07/06/2018   Procedure: ROBOT ASSISTED INGUINAL HERNIA REPAIR;  Surgeon: Jules Husbands, MD;  Location: [facility] ORS;  Service: General;  Laterality: Bilateral;   TONSILLECTOMY     UMBILICAL HERNIA REPAIR N/A 07/06/2018   Procedure: LAPAROSCOPIC ROBOT ASSISTED UMBILICAL HERNIA;  Surgeon: Jules Husbands, MD;  Location: [facility] ORS;  Service: General;  Laterality: N/A;   XI ROBOTIC ASSISTED THORACOSCOPY- SEGMENTECTOMY Left 11/09/2019   Procedure: XI ROBOTIC ASSISTED THORACOSCOPY-WEDGE RESECTION LEFT LOWER LOBE;  Surgeon: Melrose Nakayama, MD;  Location: [facility] OR;  Service: Thoracic;  Laterality: Left;    Current Medications: Current Meds  Medication Sig   albuterol (PROVENTIL) (2.5 MG/3ML) 0.083% nebulizer solution TAKE 3 MLS BY NEBULIZATION EVERY 4 (FOUR) HOURS AS NEEDED FOR WHEEZING OR SHORTNESS OF BREATH.   albuterol (VENTOLIN HFA) 108 (90 Base) MCG/ACT inhaler Inhale 1-2 puffs into the lungs every 6 (six) hours as needed for wheezing or shortness of breath.    alprazolam (XANAX) 1 MG tablet Take 1 tablet (1 mg total) by mouth as directed. 1/2 in am 1 pill qhs   aspirin EC 81 MG tablet Take 81 mg by mouth daily.   celecoxib (CELEBREX) 200 MG capsule TAKE 1 CAPSULE (200 MG TOTAL) BY MOUTH DAILY AFTER BREAKFAST.   clopidogrel (PLAVIX) 75 MG tablet TAKE 1 TABLET (75 MG TOTAL) BY MOUTH DAILY.   donepezil (ARICEPT) 5 MG tablet Take 1 tablet (5 mg total) by mouth at bedtime. Further refills per [facility] neurology   FLUoxetine  (PROZAC) 20 MG capsule Take 60 mg by mouth daily.   hydrOXYzine (ATARAX/VISTARIL) 25 MG tablet Take 25 mg by mouth daily as needed.   memantine (NAMENDA) 5 MG tablet Take 1 tablet (5 mg total) by mouth 2 (two) times daily. Taking qd   Multiple Vitamins-Minerals (MULTIVITAMIN WITH MINERALS) tablet Take 1 tablet by mouth daily.   MYRBETRIQ 50 MG TB24 tablet TAKE 1 TABLET BY MOUTH DAILY   nitroGLYCERIN (NITROSTAT) 0.4 MG SL tablet Place 1 tablet (0.4 mg total) under the tongue every 5 (five) minutes as needed for chest pain. Maximum of 3 doses   ondansetron (ZOFRAN) 4 MG tablet Take 1 tablet (4 mg total) by mouth every 8 (eight) hours as needed.   pantoprazole (PROTONIX) 40  MG tablet Take 1 tablet (40 mg total) by mouth daily. 30 min before breakfast   rosuvastatin (CRESTOR) 10 MG tablet Take 1 tablet (10 mg total) by mouth at bedtime.   sucralfate (CARAFATE) 1 g tablet Take 1 tablet (1 g total) by mouth 4 (four) times daily -  with meals and at bedtime. Stir in [employer] of liquid and let dissolve   SYMBICORT 160-4.5 MCG/ACT inhaler INHALE 2 PUFFS INTO THE LUNGS 2 (TWO) TIMES DAILY. RINSE MOUTH OUT   tamsulosin (FLOMAX) 0.4 MG CAPS capsule TAKE 1 CAPSULE (0.4 MG TOTAL) BY MOUTH DAILY AFTER SUPPER.     Allergies:   Patient has no known allergies.   Social History   Socioeconomic History   Marital status: Divorced    Spouse name: Not on file   Number of children: 4   Years of education: Not on file   Highest education level: GED or equivalent  Occupational History   Occupation: Aeronautical engineer    Comment: retired  Tobacco Use   Smoking status: Every Day    Packs/day: 1.00    Years: 3.00    Pack years: 3.00    Types: Cigarettes    Start date: 07/28/1962   Smokeless tobacco: Never   Tobacco comments:    started back in march. 1 pack a day.  Vaping Use   Vaping Use: Never used  Substance and Sexual Activity   Alcohol use: Yes    Alcohol/week: 1.0 standard drink    Types: 1 Cans of  beer per week    Comment: 2-3 beers a day   Drug use: Yes    Frequency: 7.0 times per week    Types: Marijuana    Comment: Endorsed heroin 06/2014, UDS also + benzos, opiates, THC, neg for cocaine at that time.   Sexual activity: Not Currently  Other Topics Concern   Not on file  Social History Narrative   Lives at home    Has kids and grandkids   Smoker x 55 years 1 ppd as of 07/2019 he did quit x 2 years    Drinks 1 pt liq qd as of 07/2019    Social Determinants of Health   Financial Resource Strain: Low Risk    Difficulty of Paying Living Expenses: Not very hard  Food Insecurity: No Food Insecurity   Worried About Charity fundraiser in the Last Year: Never true   Ran Out of Food in the Last Year: Never true  Transportation Needs: No Transportation Needs   Lack of Transportation (Medical): No   Lack of Transportation (Non-Medical): No  Physical Activity: Not on file  Stress: No Stress Concern Present   Feeling of Stress : Not at all  Social Connections: Not on file     Family History: The patient's family history includes Alcohol abuse in his sister; Anxiety disorder in his sister; COPD in his brother; Colon cancer in his father; Depression in his sister; Drug abuse in his sister; Heart disease in his father; Obesity in his brother. There is no history of Kidney disease or Prostate cancer.  ROS:   Please see the history of present illness.     All other systems reviewed and are negative.  EKGs/Labs/Other Studies Reviewed:    The following studies were reviewed today:  Echo 5/24/222  1. Left ventricular ejection fraction, by estimation, is 50 to 55%. The  left ventricle has low normal function. Left ventricular endocardial  border not optimally defined to evaluate  regional wall motion. There is  moderate left ventricular hypertrophy.  Left ventricular diastolic parameters are consistent with Grade I  diastolic dysfunction (impaired relaxation). The average left  ventricular  global longitudinal strain is -16.5 %.   2. Right ventricular systolic function is normal. The right ventricular  size is mildly enlarged.   3. Left atrial size was mildly dilated.   4. Right atrial size was mildly dilated.   5. The mitral valve was not well visualized. No evidence of mitral valve  regurgitation. No evidence of mitral stenosis.   6. The aortic valve is tricuspid. Aortic valve regurgitation is not  visualized. No aortic stenosis is present.   7. Aortic dilatation noted. There is mild dilatation of the aortic root,  measuring 44 mm. There is mild dilatation of the ascending aorta,  measuring 40 mm. There is mild dilatation of the aortic arch, measuring 37  mm.   Comparison(s): EF 60-65%, RVSP 34.8 mmHg, mild to moderate TR.   EKG:  EKG is  ordered today.  The ekg ordered today demonstrates NSR, 76bpm, PAC, LAD, QTC 474ms, no ischemic changes  Recent Labs: 10/30/2020: Magnesium 2.0 11/20/2020: ALT 22 01/21/2021: BUN 25; Creatinine, Ser 1.22; Hemoglobin 12.2; Platelets 303; Potassium 4.6; Sodium 138  Recent Lipid Panel    Component Value Date/Time   CHOL 111 05/21/2020 1051   TRIG 77 05/21/2020 1051   HDL 49 05/21/2020 1051   CHOLHDL 2.3 05/21/2020 1051   VLDL 15 05/21/2020 1051   LDLCALC 47 05/21/2020 1051   Physical Exam:    VS:  BP 124/74 (BP Location: Right Arm, Patient Position: Sitting, Cuff Size: Normal)   Pulse 72   Ht 6' (1.829 m)   Wt 245 lb 2 oz (111.2 kg)   SpO2 97%   BMI 33.24 kg/m     Wt Readings from Last 3 Encounters:  03/13/21 245 lb 2 oz (111.2 kg)  01/29/21 247 lb 3.2 oz (112.1 kg)  01/21/21 245 lb 9.5 oz (111.4 kg)     GEN:  Well nourished, well developed in no acute distress HEENT: Normal NECK: No JVD; No carotid bruits LYMPHATICS: No lymphadenopathy CARDIAC: RRR, no murmurs, rubs, gallops RESPIRATORY:  Clear to auscultation without rales, wheezing or rhonchi  ABDOMEN: Soft, non-tender, non-distended MUSCULOSKELETAL:   No edema; No deformity  SKIN: Warm and dry NEUROLOGIC:  Alert and oriented x 3 PSYCHIATRIC:  Normal affect   ASSESSMENT:    1. Syncope, unspecified syncope type   2. Tobacco abuse   3. Alcohol use   4. Polysubstance abuse ([place])   5. Chronic obstructive pulmonary disease, unspecified COPD type ([place])   6. Coronary artery disease involving native coronary artery of native heart with angina pectoris ([place])   7. Syncope and collapse   8. Dizziness    PLAN:    In order of problems listed above:  Syncope Dizziness Orthostatic symptoms Patient with history of dizziness, Toprol and lasix stopped at prior visits. He had syncopal episode 12/2020 and went to the ER. Syncope in the setting of ETOH use, poor PO intake, narco and benzo use, dehydration, cirrhosis, and Flomax use. Episode sounded orthostatic in nature. ER work-up was unremarkable. No further syncopal episodes. Dizziness has improved since discontinuation of BB and lasix. Orthostatics today negative. BP good at 124/74. I will order 2 week heart monitor. Echo earlier this year showed normal pump function.   CAD s/p DES to LAD 2006 Patient denies chest pain. Cath in 2011 showed patent stent with  minimal ISR. Recommendations for indefinite DAPT with Aspirin and Plavix. Continue Aspirin, plavix, statin. No BB with dizziness/orthostasis.  HFpEF Echo 11/2020 showed normal pump function. He is not on diuretic. Appears euvolemic on exam. No BB with dizziness.   HLD LDL 47 in 04/2020. Continue Crestor 10mg  daily.   Tobacco use/COPD He has chronic shortness of breath that is unchanged. Still using tobacco/weed. Cessation advised  Polysubtance abuse/Alcohol use Continued alcohol use. Also uses narcotics and benzos. Recent alcohol increase in the setting of son's death. Alcohol cessation advised.   Disposition: Follow up in 1-2 month(s) with MD/APP   Signed, Tavon Corriher Ninfa Meeker, PA-C  03/13/2021 4:17 PM    [facility]  HeartCare

## 2021-03-13 ENCOUNTER — Encounter: Payer: Self-pay | Admitting: Medical

## 2021-03-13 ENCOUNTER — Telehealth: Payer: Self-pay | Admitting: Internal Medicine

## 2021-03-13 ENCOUNTER — Ambulatory Visit (INDEPENDENT_AMBULATORY_CARE_PROVIDER_SITE_OTHER): Payer: Medicare HMO

## 2021-03-13 ENCOUNTER — Other Ambulatory Visit: Payer: Self-pay

## 2021-03-13 ENCOUNTER — Ambulatory Visit: Payer: Medicare HMO | Admitting: Medical

## 2021-03-13 VITALS — BP 124/74 | HR 72 | Ht 72.0 in | Wt 245.1 lb

## 2021-03-13 DIAGNOSIS — F319 Bipolar disorder, unspecified: Secondary | ICD-10-CM | POA: Diagnosis not present

## 2021-03-13 DIAGNOSIS — F1010 Alcohol abuse, uncomplicated: Secondary | ICD-10-CM | POA: Diagnosis not present

## 2021-03-13 DIAGNOSIS — Z7289 Other problems related to lifestyle: Secondary | ICD-10-CM | POA: Diagnosis not present

## 2021-03-13 DIAGNOSIS — Z789 Other specified health status: Secondary | ICD-10-CM

## 2021-03-13 DIAGNOSIS — R55 Syncope and collapse: Secondary | ICD-10-CM

## 2021-03-13 DIAGNOSIS — Z720 Tobacco use: Secondary | ICD-10-CM | POA: Diagnosis not present

## 2021-03-13 DIAGNOSIS — J449 Chronic obstructive pulmonary disease, unspecified: Secondary | ICD-10-CM

## 2021-03-13 DIAGNOSIS — F1910 Other psychoactive substance abuse, uncomplicated: Secondary | ICD-10-CM

## 2021-03-13 DIAGNOSIS — I509 Heart failure, unspecified: Secondary | ICD-10-CM | POA: Diagnosis not present

## 2021-03-13 DIAGNOSIS — I2510 Atherosclerotic heart disease of native coronary artery without angina pectoris: Secondary | ICD-10-CM | POA: Diagnosis not present

## 2021-03-13 DIAGNOSIS — R42 Dizziness and giddiness: Secondary | ICD-10-CM | POA: Diagnosis not present

## 2021-03-13 DIAGNOSIS — I110 Hypertensive heart disease with heart failure: Secondary | ICD-10-CM | POA: Diagnosis not present

## 2021-03-13 DIAGNOSIS — Q7649 Other congenital malformations of spine, not associated with scoliosis: Secondary | ICD-10-CM | POA: Diagnosis not present

## 2021-03-13 DIAGNOSIS — M1990 Unspecified osteoarthritis, unspecified site: Secondary | ICD-10-CM | POA: Diagnosis not present

## 2021-03-13 DIAGNOSIS — D649 Anemia, unspecified: Secondary | ICD-10-CM | POA: Diagnosis not present

## 2021-03-13 DIAGNOSIS — K648 Other hemorrhoids: Secondary | ICD-10-CM | POA: Diagnosis not present

## 2021-03-13 DIAGNOSIS — I25119 Atherosclerotic heart disease of native coronary artery with unspecified angina pectoris: Secondary | ICD-10-CM | POA: Diagnosis not present

## 2021-03-13 NOTE — Patient Instructions (Signed)
Medication Instructions:  No changes at this time.  *If you need a refill on your cardiac medications before your next appointment, please call your pharmacy*   Lab Work: None  If you have labs (blood work) drawn today and your tests are completely normal, you will receive your results only by: [place] (if you have MyChart) OR A paper copy in the mail If you have any lab test that is abnormal or we need to change your treatment, we will call you to review the results.   Testing/Procedures: Your physician has recommended that you wear a Zio monitor for 2 weeks. Remove monitor on August 31 st and return in box provided.   This monitor is a medical device that records the heart's electrical activity. Doctors most often use these monitors to diagnose arrhythmias. Arrhythmias are problems with the speed or rhythm of the heartbeat. The monitor is a small device applied to your chest. You can wear one while you do your normal daily activities. While wearing this monitor if you have any symptoms to push the button and record what you felt. Once you have worn this monitor for the period of time provider prescribed (Usually 14 days), you will return the monitor device in the postage paid box. Once it is returned they will download the data collected and provide [place] with a report which the provider will then review and we will call you with those results. Important tips:  Avoid showering during the first 24 hours of wearing the monitor. Avoid excessive sweating to help maximize wear time. Do not submerge the device, no hot tubs, and no swimming pools. Keep any lotions or oils away from the patch. After 24 hours you may shower with the patch on. Take brief showers with your back facing the shower head.  Do not remove patch once it has been placed because that will interrupt data and decrease adhesive wear time. Push the button when you have any symptoms and write down what you were  feeling. Once you have completed wearing your monitor, remove and place into box which has postage paid and place in your outgoing mailbox.  If for some reason you have misplaced your box then call our office and we can provide another box and/or mail it off for you.      Follow-Up: At [facility], you and your health needs are our priority.  As part of our continuing mission to provide you with exceptional heart care, we have created designated Provider Care Teams.  These Care Teams include your primary Cardiologist (physician) and Advanced Practice Providers (APPs -  Physician Assistants and Nurse Practitioners) who all work together to provide you with the care you need, when you need it.    Your next appointment:   2 month(s)  The format for your next appointment:   In Person  Provider:   Kathlyn Sacramento, MD or Cadence Kathlen Mody, [place]

## 2021-03-13 NOTE — Telephone Encounter (Signed)
Lft pt vm to call ofc to sch [facility]. thanks ?

## 2021-03-18 ENCOUNTER — Telehealth: Payer: Self-pay | Admitting: Internal Medicine

## 2021-03-18 NOTE — Telephone Encounter (Signed)
Zara from [facility] is calling with the patient on the line to request a psychiatry referral.He would like for the referral to be sent to [place] and their fax number is [phone].

## 2021-03-19 DIAGNOSIS — I509 Heart failure, unspecified: Secondary | ICD-10-CM | POA: Diagnosis not present

## 2021-03-19 DIAGNOSIS — I2510 Atherosclerotic heart disease of native coronary artery without angina pectoris: Secondary | ICD-10-CM | POA: Diagnosis not present

## 2021-03-19 DIAGNOSIS — K648 Other hemorrhoids: Secondary | ICD-10-CM | POA: Diagnosis not present

## 2021-03-19 DIAGNOSIS — I110 Hypertensive heart disease with heart failure: Secondary | ICD-10-CM | POA: Diagnosis not present

## 2021-03-19 DIAGNOSIS — D649 Anemia, unspecified: Secondary | ICD-10-CM | POA: Diagnosis not present

## 2021-03-19 DIAGNOSIS — Q7649 Other congenital malformations of spine, not associated with scoliosis: Secondary | ICD-10-CM | POA: Diagnosis not present

## 2021-03-19 DIAGNOSIS — M1990 Unspecified osteoarthritis, unspecified site: Secondary | ICD-10-CM | POA: Diagnosis not present

## 2021-03-19 DIAGNOSIS — F1010 Alcohol abuse, uncomplicated: Secondary | ICD-10-CM | POA: Diagnosis not present

## 2021-03-19 DIAGNOSIS — F319 Bipolar disorder, unspecified: Secondary | ICD-10-CM | POA: Diagnosis not present

## 2021-03-19 NOTE — Telephone Encounter (Signed)
Referral updated to [place] psych and re-sent.   Patient informed and verbalized understanding.

## 2021-03-19 NOTE — Addendum Note (Signed)
Addended by: Thressa Sheller on: 03/19/2021 01:40 PM   Modules accepted: Orders

## 2021-03-22 ENCOUNTER — Telehealth: Payer: Self-pay

## 2021-03-22 DIAGNOSIS — F4323 Adjustment disorder with mixed anxiety and depressed mood: Secondary | ICD-10-CM | POA: Diagnosis not present

## 2021-03-22 DIAGNOSIS — F339 Major depressive disorder, recurrent, unspecified: Secondary | ICD-10-CM | POA: Diagnosis not present

## 2021-03-22 NOTE — Telephone Encounter (Signed)
Confirmed fax for Christopher Orr plan of care form to [place]. Sent to scan

## 2021-03-22 NOTE — Telephone Encounter (Signed)
Pt home health certification and plan of care has been confirmed fax and sent to scan.

## 2021-03-26 DIAGNOSIS — J449 Chronic obstructive pulmonary disease, unspecified: Secondary | ICD-10-CM | POA: Diagnosis not present

## 2021-03-26 DIAGNOSIS — R030 Elevated blood-pressure reading, without diagnosis of hypertension: Secondary | ICD-10-CM | POA: Diagnosis not present

## 2021-03-26 DIAGNOSIS — F331 Major depressive disorder, recurrent, moderate: Secondary | ICD-10-CM | POA: Diagnosis not present

## 2021-03-26 DIAGNOSIS — Z720 Tobacco use: Secondary | ICD-10-CM | POA: Diagnosis not present

## 2021-03-26 DIAGNOSIS — I110 Hypertensive heart disease with heart failure: Secondary | ICD-10-CM | POA: Diagnosis not present

## 2021-03-26 DIAGNOSIS — F1010 Alcohol abuse, uncomplicated: Secondary | ICD-10-CM | POA: Diagnosis not present

## 2021-03-26 DIAGNOSIS — Q7649 Other congenital malformations of spine, not associated with scoliosis: Secondary | ICD-10-CM | POA: Diagnosis not present

## 2021-03-26 DIAGNOSIS — F319 Bipolar disorder, unspecified: Secondary | ICD-10-CM | POA: Diagnosis not present

## 2021-03-26 DIAGNOSIS — M1990 Unspecified osteoarthritis, unspecified site: Secondary | ICD-10-CM | POA: Diagnosis not present

## 2021-03-26 DIAGNOSIS — E261 Secondary hyperaldosteronism: Secondary | ICD-10-CM | POA: Diagnosis not present

## 2021-03-26 DIAGNOSIS — D692 Other nonthrombocytopenic purpura: Secondary | ICD-10-CM | POA: Diagnosis not present

## 2021-03-26 DIAGNOSIS — I509 Heart failure, unspecified: Secondary | ICD-10-CM | POA: Diagnosis not present

## 2021-03-26 DIAGNOSIS — K648 Other hemorrhoids: Secondary | ICD-10-CM | POA: Diagnosis not present

## 2021-03-26 DIAGNOSIS — D649 Anemia, unspecified: Secondary | ICD-10-CM | POA: Diagnosis not present

## 2021-03-26 DIAGNOSIS — Z7902 Long term (current) use of antithrombotics/antiplatelets: Secondary | ICD-10-CM | POA: Diagnosis not present

## 2021-03-26 DIAGNOSIS — I2510 Atherosclerotic heart disease of native coronary artery without angina pectoris: Secondary | ICD-10-CM | POA: Diagnosis not present

## 2021-03-26 DIAGNOSIS — F330 Major depressive disorder, recurrent, mild: Secondary | ICD-10-CM | POA: Diagnosis not present

## 2021-03-26 DIAGNOSIS — I739 Peripheral vascular disease, unspecified: Secondary | ICD-10-CM | POA: Diagnosis not present

## 2021-03-26 DIAGNOSIS — F0390 Unspecified dementia without behavioral disturbance: Secondary | ICD-10-CM | POA: Diagnosis not present

## 2021-03-29 ENCOUNTER — Telehealth: Payer: Self-pay

## 2021-03-29 NOTE — Telephone Encounter (Signed)
Confirmed fax from [facility] home health orders. Orders have been sent to scan.

## 2021-04-02 DIAGNOSIS — I110 Hypertensive heart disease with heart failure: Secondary | ICD-10-CM | POA: Diagnosis not present

## 2021-04-02 DIAGNOSIS — Q7649 Other congenital malformations of spine, not associated with scoliosis: Secondary | ICD-10-CM | POA: Diagnosis not present

## 2021-04-02 DIAGNOSIS — D649 Anemia, unspecified: Secondary | ICD-10-CM | POA: Diagnosis not present

## 2021-04-02 DIAGNOSIS — F319 Bipolar disorder, unspecified: Secondary | ICD-10-CM | POA: Diagnosis not present

## 2021-04-02 DIAGNOSIS — I509 Heart failure, unspecified: Secondary | ICD-10-CM | POA: Diagnosis not present

## 2021-04-02 DIAGNOSIS — R55 Syncope and collapse: Secondary | ICD-10-CM | POA: Diagnosis not present

## 2021-04-02 DIAGNOSIS — I2510 Atherosclerotic heart disease of native coronary artery without angina pectoris: Secondary | ICD-10-CM | POA: Diagnosis not present

## 2021-04-02 DIAGNOSIS — M1990 Unspecified osteoarthritis, unspecified site: Secondary | ICD-10-CM | POA: Diagnosis not present

## 2021-04-02 DIAGNOSIS — K648 Other hemorrhoids: Secondary | ICD-10-CM | POA: Diagnosis not present

## 2021-04-02 DIAGNOSIS — F1010 Alcohol abuse, uncomplicated: Secondary | ICD-10-CM | POA: Diagnosis not present

## 2021-04-03 ENCOUNTER — Encounter: Payer: Self-pay | Admitting: Podiatry

## 2021-04-03 ENCOUNTER — Other Ambulatory Visit: Payer: Self-pay

## 2021-04-03 ENCOUNTER — Ambulatory Visit: Payer: Medicare HMO | Admitting: Podiatry

## 2021-04-03 DIAGNOSIS — L603 Nail dystrophy: Secondary | ICD-10-CM | POA: Diagnosis not present

## 2021-04-03 MED ORDER — NEOMYCIN-POLYMYXIN-HC 1 % OT SOLN: OTIC | 0 refills | Status: DC

## 2021-04-03 NOTE — Patient Instructions (Signed)

## 2021-04-05 DIAGNOSIS — F330 Major depressive disorder, recurrent, mild: Secondary | ICD-10-CM | POA: Diagnosis not present

## 2021-04-06 NOTE — Progress Notes (Signed)
  Subjective:  Patient ID: Christopher Orr, male    DOB: 1946/01/03,  MRN: [id number]  Chief Complaint  Patient presents with   Nail Problem      New pt- Big toe nails need to come off. per pt    75 y.o. male presents with the above complaint. History confirmed with patient.  Both nails are thick and cause quite a bit of pain when he is in shoe gear he thinks he is damaged and before  Objective:  Physical Exam: warm, good capillary refill, no trophic changes or ulcerative lesions, normal DP and PT pulses, and normal sensory exam.  Bilateral hallux severe nail dystrophy  Assessment:   1. Nail dystrophy      Plan:  Patient was evaluated and treated and all questions answered.    Dystrophic Nail, bilaterally -Patient elects to proceed with minor surgery to remove toenail today. Consent reviewed and signed by patient. -Ingrown nail excised. See procedure note. -Educated on post-procedure care including soaking. Written instructions provided and reviewed. -Patient to follow up in 2 weeks for nail check.  Procedure: Excision of Toenail Location: Bilateral 1st toe  nail . Anesthesia: Lidocaine 1% plain; 1.5 mL and Marcaine 0.5% plain; 1.5 mL, digital block. Skin Prep: Betadine. Dressing: Silvadene; telfa; dry, sterile, compression dressing. Technique: Following skin prep, the toe was exsanguinated and a tourniquet was secured at the base of the toe. The affected nail was freed and excised. Chemical matrixectomy was then performed with phenol and irrigated out with alcohol. The tourniquet was then removed and sterile dressing applied. Disposition: Patient tolerated procedure well. Patient to return in 2 weeks for follow-up.    Return in about 2 weeks (around 04/17/2021) for bilateral nail check.

## 2021-04-09 ENCOUNTER — Telehealth: Payer: Self-pay

## 2021-04-09 NOTE — Telephone Encounter (Signed)
Received a fax from [facility] that home health services were denied. Fax has been given to doc of the day.

## 2021-04-10 ENCOUNTER — Telehealth: Payer: Self-pay

## 2021-04-10 NOTE — Telephone Encounter (Signed)
Reviewed notice and reviewed information in chart.  Recently saw cardiology.  Blood pressure stable.  Not orthostatic on exam.  Per note, had been receiving services, but apparently does not qualify for extension of services.

## 2021-04-10 NOTE — Telephone Encounter (Signed)
Was able to reach out to pt via phone and make contact to review their recent ZIO monitor results. Cadence Kathlen Mody, PA-C advised based on the current results.  Heart monitor showed predominately SR with average heart rate of 71bpm, 25 runs of SVT (fast rhthym), frequent PACs, rare Pvcs, no triggered events which is overall reassuring. BB previously stopped for syncope and dizziness and patient reports he was feeling better after this. Follow-up with PCP.   Pt verbalized understanding, is thankful for the results call, all questions and concerns were address. Will call back for anything further, f/u as schedule.

## 2021-04-17 ENCOUNTER — Ambulatory Visit (INDEPENDENT_AMBULATORY_CARE_PROVIDER_SITE_OTHER): Payer: Medicare HMO

## 2021-04-17 ENCOUNTER — Ambulatory Visit: Payer: Medicare HMO | Admitting: Podiatry

## 2021-04-17 VITALS — Ht 72.0 in | Wt 245.0 lb

## 2021-04-17 DIAGNOSIS — Z0000 Encounter for general adult medical examination without abnormal findings: Secondary | ICD-10-CM | POA: Diagnosis not present

## 2021-04-17 NOTE — Patient Instructions (Addendum)
  Christopher Orr , Thank you for taking time to come for your Medicare Wellness Visit. I appreciate your ongoing commitment to your health goals. Please review the following plan we discussed and let me know if I can assist you in the future.   These are the goals we discussed:  Goals       [name] (pt-stated)      To be completed.       Other     Healthy lifestyle      Low carb Stay hydrated Stay active          This is a list of the screening recommended for you and due dates:  Health Maintenance  Topic Date Due   COVID-19 Vaccine (4 - Booster for Pfizer series) 05/03/2021*   Zoster (Shingles) Vaccine (1 of 2) 07/17/2021*   Flu Shot  10/25/2021*   Tetanus Vaccine  04/17/2022*   Colon Cancer Screening  10/03/2029   Hepatitis C Screening: USPSTF Recommendation to screen - Ages 18-79 yo.  Completed   HPV Vaccine  Aged Out  *Topic was postponed. The date shown is not the original due date.

## 2021-04-17 NOTE — Progress Notes (Addendum)
Subjective:   Christopher Orr is a 75 y.o. male who presents for Medicare Annual/Subsequent preventive examination.  Review of Systems    No ROS.  Medicare Wellness Virtual Visit.  Visual/audio telehealth visit, UTA vital signs.   See social history for additional risk factors.   Cardiac Risk Factors include: none;advanced age (>52men, >66 women);male gender     Objective:    Today's Vitals   04/17/21 0950  Weight: 245 lb (111.1 kg)  Height: 6' (1.829 m)   Body mass index is 33.23 kg/m.  Advanced Directives 04/17/2021 01/21/2021 10/30/2020 05/30/2020 05/21/2020 04/16/2020 02/15/2020  Does Patient Have a Medical Advance Directive? Yes No No No No Yes No  Type of Paramedic of [place];Living will - - - - - -  Does patient want to make changes to medical advance directive? No - Patient declined - - - - No - Patient declined -  Copy of [place] in Chart? Yes - validated most recent copy scanned in chart (See row information) - - - - - -  Would patient like information on creating a medical advance directive? - No - Patient declined - No - Guardian declined No - Patient declined - Yes (MAU/Ambulatory/Procedural Areas - Information given)    Current Medications (verified) Outpatient Encounter Medications as of 04/17/2021  Medication Sig   albuterol (PROVENTIL) (2.5 MG/3ML) 0.083% nebulizer solution TAKE 3 MLS BY NEBULIZATION EVERY 4 (FOUR) HOURS AS NEEDED FOR WHEEZING OR SHORTNESS OF BREATH.   albuterol (VENTOLIN HFA) 108 (90 Base) MCG/ACT inhaler Inhale 1-2 puffs into the lungs every 6 (six) hours as needed for wheezing or shortness of breath.    ALPRAZolam (XANAX) 0.25 MG tablet    alprazolam (XANAX) 1 MG tablet Take 1 tablet (1 mg total) by mouth as directed. 1/2 in am 1 pill qhs   ARIPiprazole (ABILIFY) 10 MG tablet Take 10 mg by mouth daily. (Patient not taking: No sig reported)   aspirin EC 81 MG tablet Take 81 mg by mouth daily.    Budeson-Glycopyrrol-Formoterol (BREZTRI AEROSPHERE) 160-9-4.8 MCG/ACT AERO Inhale 2 puffs into the lungs in the morning and at bedtime. (Patient not taking: No sig reported)   celecoxib (CELEBREX) 200 MG capsule TAKE 1 CAPSULE (200 MG TOTAL) BY MOUTH DAILY AFTER BREAKFAST.   clopidogrel (PLAVIX) 75 MG tablet TAKE 1 TABLET (75 MG TOTAL) BY MOUTH DAILY.   donepezil (ARICEPT) 5 MG tablet Take 1 tablet (5 mg total) by mouth at bedtime. Further refills per [facility] neurology   FLUoxetine (PROZAC) 20 MG capsule Take 60 mg by mouth daily.   hydrOXYzine (ATARAX/VISTARIL) 25 MG tablet Take 25 mg by mouth daily as needed.   memantine (NAMENDA) 5 MG tablet Take 1 tablet (5 mg total) by mouth 2 (two) times daily. Taking qd   metoprolol succinate (TOPROL-XL) 25 MG 24 hr tablet    Multiple Vitamins-Minerals (MULTIVITAMIN WITH MINERALS) tablet Take 1 tablet by mouth daily.   MYRBETRIQ 50 MG TB24 tablet TAKE 1 TABLET BY MOUTH DAILY   NEOMYCIN-POLYMYXIN-HYDROCORTISONE (CORTISPORIN) 1 % SOLN OTIC solution Apply to nail beds from procedure site twice daily after soaks   nitroGLYCERIN (NITROSTAT) 0.4 MG SL tablet Place 1 tablet (0.4 mg total) under the tongue every 5 (five) minutes as needed for chest pain. Maximum of 3 doses   ondansetron (ZOFRAN) 4 MG tablet Take 1 tablet (4 mg total) by mouth every 8 (eight) hours as needed.   pantoprazole (PROTONIX) 40 MG tablet Take  1 tablet (40 mg total) by mouth daily. 30 min before breakfast   rosuvastatin (CRESTOR) 10 MG tablet Take 1 tablet (10 mg total) by mouth at bedtime.   sucralfate (CARAFATE) 1 g tablet Take 1 tablet (1 g total) by mouth 4 (four) times daily -  with meals and at bedtime. Stir in [employer] of liquid and let dissolve   SYMBICORT 160-4.5 MCG/ACT inhaler INHALE 2 PUFFS INTO THE LUNGS 2 (TWO) TIMES DAILY. RINSE MOUTH OUT   tamsulosin (FLOMAX) 0.4 MG CAPS capsule TAKE 1 CAPSULE (0.4 MG TOTAL) BY MOUTH DAILY AFTER SUPPER.   [DISCONTINUED] traZODone (DESYREL) 150 MG  tablet Take 1 tablet (150 mg total) by mouth at bedtime.   No facility-administered encounter medications on file as of 04/17/2021.    Allergies (verified) Patient has no known allergies.   History: Past Medical History:  Diagnosis Date   Alcohol abuse    pt states it is marijuana   Anemia    Arthritis    Bipolar disorder (HCC)    BPH (benign prostatic hypertrophy) with urinary obstruction    CAD (coronary artery disease)    a. s/p stent to LAD in 2006 at [facility], EF 60% at that time.   Cancer [facility])    prostate   CHF (congestive heart failure) (HCC)    Chronic pancreatitis (HCC)    Cirrhosis (HCC)    COPD (chronic obstructive pulmonary disease) ([place])    COVID-19    08/22/20   Dementia ([place])    early dementia   Depression    Diverticulitis    12/06/20 [facility]    Diverticulosis    Drug use    Dyspnea    ED (erectile dysfunction)    Gastritis    GERD (gastroesophageal reflux disease)    Headache    occasional migraines   Hepatitis C    treated   History of lumbar fusion    Hyperlipidemia    Hypertension    patient denies having high blood pressure   Lung cancer ([place])    Mitral regurgitation    Pancreatitis    x 2    Pneumonia    07/06/18    PTSD (post-traumatic stress disorder)    Rib fracture    s/p fall 03/15/19    Sinus disease    Urge incontinence    Past Surgical History:  Procedure Laterality Date   CARDIAC CATHETERIZATION  2006   cateract  2013   COLONOSCOPY WITH PROPOFOL N/A 10/04/2019   Procedure: COLONOSCOPY WITH PROPOFOL;  Surgeon: Lucilla Lame, MD;  Location: [facility] ENDOSCOPY;  Service: Endoscopy;  Laterality: N/A;   CORONARY ANGIOPLASTY  2006   CORONARY STENT PLACEMENT  2006   x 1   ESOPHAGOGASTRODUODENOSCOPY N/A 12/22/2014   Procedure: ESOPHAGOGASTRODUODENOSCOPY (EGD);  Surgeon: [name], MD;  Location: [facility] ENDOSCOPY;  Service: Endoscopy;  Laterality: N/A;   ESOPHAGOGASTRODUODENOSCOPY (EGD) WITH PROPOFOL N/A 10/04/2019   Procedure:  ESOPHAGOGASTRODUODENOSCOPY (EGD) WITH PROPOFOL;  Surgeon: Lucilla Lame, MD;  Location: [facility] ENDOSCOPY;  Service: Endoscopy;  Laterality: N/A;   HERNIA REPAIR  2015   left groin   INTERCOSTAL NERVE BLOCK Left 11/09/2019   Procedure: Intercostal Nerve Block;  Surgeon: Melrose Nakayama, MD;  Location: [place];  Service: Thoracic;  Laterality: Left;   LUMBAR FUSION     RADIOACTIVE SEED IMPLANT N/A 04/22/2016   Procedure: RADIOACTIVE SEED IMPLANT/BRACHYTHERAPY IMPLANT;  Surgeon: Hollice Espy, MD;  Location: [facility] ORS;  Service: Urology;  Laterality: N/A;   ROBOT ASSISTED  INGUINAL HERNIA REPAIR Bilateral 07/06/2018   Procedure: ROBOT ASSISTED INGUINAL HERNIA REPAIR;  Surgeon: Jules Husbands, MD;  Location: [facility] ORS;  Service: General;  Laterality: Bilateral;   TONSILLECTOMY     UMBILICAL HERNIA REPAIR N/A 07/06/2018   Procedure: LAPAROSCOPIC ROBOT ASSISTED UMBILICAL HERNIA;  Surgeon: Jules Husbands, MD;  Location: [facility] ORS;  Service: General;  Laterality: N/A;   XI ROBOTIC ASSISTED THORACOSCOPY- SEGMENTECTOMY Left 11/09/2019   Procedure: XI ROBOTIC ASSISTED THORACOSCOPY-WEDGE RESECTION LEFT LOWER LOBE;  Surgeon: Melrose Nakayama, MD;  Location: [facility] OR;  Service: Thoracic;  Laterality: Left;   Family History  Problem Relation Age of Onset   Heart disease Father        Unclear details. Father passed in late 12's 2/2 cancer.   Colon cancer Father    Alcohol abuse Sister    Drug abuse Sister    Anxiety disorder Sister    Depression Sister    COPD Brother    Obesity Brother    Kidney disease Neg Hx    Prostate cancer Neg Hx    Social History   Socioeconomic History   Marital status: Divorced    Spouse name: Not on file   Number of children: 4   Years of education: Not on file   Highest education level: GED or equivalent  Occupational History   Occupation: Aeronautical engineer    Comment: retired  Tobacco Use   Smoking status: Every Day    Packs/day: 1.00    Years: 3.00    Pack  years: 3.00    Types: Cigarettes    Start date: 07/28/1962   Smokeless tobacco: Never   Tobacco comments:    started back in march. 1 pack a day.  Vaping Use   Vaping Use: Never used  Substance and Sexual Activity   Alcohol use: Yes    Alcohol/week: 1.0 standard drink    Types: 1 Cans of beer per week    Comment: 2-3 beers a day   Drug use: Yes    Frequency: 7.0 times per week    Types: Marijuana    Comment: Endorsed heroin 06/2014, UDS also + benzos, opiates, THC, neg for cocaine at that time.   Sexual activity: Not Currently  Other Topics Concern   Not on file  Social History Narrative   Lives at home    Has kids and grandkids   Smoker x 55 years 1 ppd as of 07/2019 he did quit x 2 years    Drinks 1 pt liq qd as of 07/2019    Social Determinants of Health   Financial Resource Strain: Low Risk    Difficulty of Paying Living Expenses: Not hard at all  Food Insecurity: No Food Insecurity   Worried About Charity fundraiser in the Last Year: Never true   Arboriculturist in the Last Year: Never true  Transportation Needs: No Transportation Needs   Lack of Transportation (Medical): No   Lack of Transportation (Non-Medical): No  Physical Activity: Not on file  Stress: No Stress Concern Present   Feeling of Stress : Not at all  Social Connections: Moderately Isolated   Frequency of Communication with Friends and Family: More than three times a week   Frequency of Social Gatherings with Friends and Family: Once a week   Attends Religious Services: Never   Christopher Orr or Organizations: Yes   Attends Music therapist: More than 4 times per year  Marital Status: Divorced    Tobacco Counseling Ready to quit: Not Answered Counseling given: Not Answered Tobacco comments: started back in march. 1 pack a day.   Clinical Intake:  Pre-visit preparation completed: Yes        Diabetes: No  How often do you need to have someone help you when you read  instructions, pamphlets, or other written materials from your doctor or pharmacy?: 1 - Never    Interpreter Needed?: No      Activities of Daily Living In your present state of health, do you have any difficulty performing the following activities: 04/17/2021  Hearing? N  Vision? N  Difficulty concentrating or making decisions? Y  Comment Followed by Neurology  Walking or climbing stairs? Y  Comment Leg weakness  Dressing or bathing? N  Doing errands, shopping? N  Preparing Food and eating ? N  Using the Toilet? N  In the past six months, have you accidently leaked urine? Y  Do you have problems with loss of bowel control? N  Managing your Medications? N  Managing your Finances? N  Housekeeping or managing your Housekeeping? N  Some recent data might be hidden    Patient Care Team: McLean-Scocuzza, [name], MD as PCP - General (Internal Medicine) Wellington Hampshire, MD as PCP - Cardiology (Cardiology) Telford Nab, RN as Oncology Nurse Navigator  Indicate any recent Medical Services you may have received from other than [facility] providers in the past year (date may be approximate).     Assessment:   This is a routine wellness examination for Christopher Orr.  I connected with Christopher Orr today by telephone and verified that I am speaking with the correct person using two identifiers. Location patient: home Location provider: work Persons participating in the virtual visit: patient, Christopher Orr.    I discussed the limitations, risks, security and privacy concerns of performing an evaluation and management service by telephone and the availability of in person appointments. I also discussed with the patient that there may be a patient responsible charge related to this service. The patient expressed understanding and verbally consented to this telephonic visit.    Interactive audio and video telecommunications were attempted between this provider and patient, however failed, due to patient  having technical difficulties OR patient did not have access to video capability.  We continued and completed visit with audio only.  Some vital signs may be absent or patient reported.   Hearing/Vision screen Hearing Screening - Comments:: Patient is able to hear conversational tones without difficulty. No issues reported.  Vision Screening - Comments:: Wears corrective lenses   Dietary issues and exercise activities discussed: Current Exercise Habits: The patient does not participate in regular exercise at present Regular diet    Goals Addressed             This Visit's Progress    Healthy lifestyle       Low carb Stay hydrated Stay active         Depression Screen PHQ 2/9 Scores 04/17/2021 12/27/2020 04/16/2020 11/15/2019 08/12/2019 04/06/2019 04/09/2017  PHQ - 2 Score 0 2 0 6 0 0 0  PHQ- 9 Score - 10 - 18 - - -    Fall Risk Fall Risk  04/17/2021 01/29/2021 12/27/2020 12/12/2020 08/21/2020  Falls in the past year? 1 1 0 0 0  Number falls in past yr: - 1 0 0 0  Injury with Fall? - 1 0 0 0  Risk for fall due to : History of  fall(s) History of fall(s) - - -  Follow up Falls evaluation completed Falls evaluation completed Falls evaluation completed Falls evaluation completed Falls evaluation completed    Cross: Adequate lighting in your home to reduce risk of falls? Yes   ASSISTIVE DEVICES UTILIZED TO PREVENT FALLS: Life alert? No  Use of a cane, walker or w/c? No  Grab bars in the bathroom? No  Shower chair or bench in shower? No  Elevated toilet seat or a handicapped toilet? No   TIMED UP AND GO: Was the test performed? No .   Cognitive Function:  Patient is alert and oriented x3.  Followed by Neurology, Dr. Manuella Ghazi Last visit 02/22/21. Taking Namenda and Aricept as directed.    6CIT Screen 04/17/2021 04/16/2020 04/06/2019  What Year? 0 points 0 points 0 points  What month? 0 points 0 points 0 points  What time? 0 points 0 points 0  points  Count back from 20 - - 0 points  Months in reverse - - 0 points  Repeat phrase - - 0 points  Total Score - - 0    Immunizations Immunization History  Administered Date(s) Administered   Fluad Quad(high Dose 65+) 05/04/2019   Influenza, High Dose Seasonal PF 04/15/2019, 05/10/2020   Influenza,inj,Quad PF,6+ Mos 05/04/2018   PFIZER(Purple Top)SARS-COV-2 Vaccination 09/28/2019, 10/19/2019, 05/10/2020   Pneumococcal Conjugate-13 04/15/2019   TDAP status: Due, Education has been provided regarding the importance of this vaccine. Advised may receive this vaccine at local pharmacy or Health Dept. Aware to provide a copy of the vaccination record if obtained from local pharmacy or Health Dept. Verbalized acceptance and understanding. Deferred.   Shingles vaccine- Due, Education has been provided regarding the importance of this vaccine. Advised may receive this vaccine at local pharmacy or Health Dept. Aware to provide a copy of the vaccination record if obtained from local pharmacy or Health Dept. Verbalized acceptance and understanding. Deferred.  Influenza vaccine- plans to receive later in the season.   Health Maintenance Health Maintenance  Topic Date Due   COVID-19 Vaccine (4 - Booster for Pfizer series) 05/03/2021 (Originally 08/02/2020)   Zoster Vaccines- Shingrix (1 of 2) 07/17/2021 (Originally 08/12/1964)   INFLUENZA VACCINE  10/25/2021 (Originally 02/25/2021)   TETANUS/TDAP  04/17/2022 (Originally 08/12/1964)   COLONOSCOPY (Pts 45-58yrs Insurance coverage will need to be confirmed)  10/03/2029   Hepatitis C Screening  Completed   HPV VACCINES  Aged Out   Lung Cancer Screening: (Low Dose CT Chest recommended if Age 23-80 years, 30 pack-year currently smoking OR have quit w/in 15years.) Scheduled 06/13/21.  Vision Screening: Recommended annual ophthalmology exams for early detection of glaucoma and other disorders of the eye.  Dental Screening: Recommended annual dental exams  for proper oral hygiene  Community Resource Referral / Chronic Care Management: CRR required this visit?  No   CCM required this visit?  No      Plan:   Keep all routine maintenance appointments.   I have personally reviewed and noted the following in the patient's chart:   Medical and social history Use of alcohol, tobacco or illicit drugs  Current medications and supplements including opioid prescriptions. Patient is not currently taking opioid prescriptions. Functional ability and status Nutritional status Physical activity Advanced directives List of other physicians Hospitalizations, surgeries, and ER visits in previous 12 months Vitals Screenings to include cognitive, depression, and falls Referrals and appointments  In addition, I have reviewed and discussed with patient certain preventive  protocols, quality metrics, and best practice recommendations. A written personalized care plan for preventive services as well as general preventive health recommendations were provided to patient via mychart.     OBrien-Blaney, Darria Corvera L, LPN   08/06/3157     I have reviewed the above information and agree with above.   Deborra Medina, MD

## 2021-05-07 ENCOUNTER — Ambulatory Visit: Payer: Medicare HMO

## 2021-05-16 ENCOUNTER — Ambulatory Visit (INDEPENDENT_AMBULATORY_CARE_PROVIDER_SITE_OTHER): Payer: Medicare HMO

## 2021-05-16 ENCOUNTER — Other Ambulatory Visit: Payer: Self-pay

## 2021-05-16 ENCOUNTER — Encounter: Payer: Self-pay | Admitting: Medical

## 2021-05-16 ENCOUNTER — Ambulatory Visit: Payer: Medicare HMO | Admitting: Medical

## 2021-05-16 VITALS — BP 138/68 | HR 84 | Ht 72.0 in | Wt 246.5 lb

## 2021-05-16 DIAGNOSIS — I2510 Atherosclerotic heart disease of native coronary artery without angina pectoris: Secondary | ICD-10-CM | POA: Diagnosis not present

## 2021-05-16 DIAGNOSIS — I5032 Chronic diastolic (congestive) heart failure: Secondary | ICD-10-CM

## 2021-05-16 DIAGNOSIS — I25119 Atherosclerotic heart disease of native coronary artery with unspecified angina pectoris: Secondary | ICD-10-CM

## 2021-05-16 DIAGNOSIS — Z789 Other specified health status: Secondary | ICD-10-CM | POA: Diagnosis not present

## 2021-05-16 DIAGNOSIS — R42 Dizziness and giddiness: Secondary | ICD-10-CM

## 2021-05-16 DIAGNOSIS — R55 Syncope and collapse: Secondary | ICD-10-CM

## 2021-05-16 DIAGNOSIS — Z720 Tobacco use: Secondary | ICD-10-CM

## 2021-05-16 DIAGNOSIS — J449 Chronic obstructive pulmonary disease, unspecified: Secondary | ICD-10-CM

## 2021-05-16 DIAGNOSIS — E782 Mixed hyperlipidemia: Secondary | ICD-10-CM | POA: Diagnosis not present

## 2021-05-16 MED ORDER — METOPROLOL SUCCINATE ER 25 MG PO TB24: 25.0000 mg | ORAL_TABLET | Freq: Every day | ORAL | 6 refills | Status: DC

## 2021-05-16 NOTE — Patient Instructions (Signed)
Medication Instructions:  Your physician has recommended you make the following change in your medication:   START Toprol XL (Metoprolol succinate) 25 mg once daily   *If you need a refill on your cardiac medications before your next appointment, please call your pharmacy*   Lab Work: None  If you have labs (blood work) drawn today and your tests are completely normal, you will receive your results only by: [address] (if you have MyChart) OR A paper copy in the mail If you have any lab test that is abnormal or we need to change your treatment, we will call you to review the results.   Testing/Procedures: Your physician has recommended that you wear a Zio monitor for 7 days then on 05/23/21 remove monitor and place in box provided then place in mail.   This monitor is a medical device that records the heart's electrical activity. Doctors most often use these monitors to diagnose arrhythmias. Arrhythmias are problems with the speed or rhythm of the heartbeat. The monitor is a small device applied to your chest. You can wear one while you do your normal daily activities. While wearing this monitor if you have any symptoms to push the button and record what you felt. Once you have worn this monitor for the period of time provider prescribed (Usually 14 days), you will return the monitor device in the postage paid box. Once it is returned they will download the data collected and provide [place] with a report which the provider will then review and we will call you with those results. Important tips:  Avoid showering during the first 24 hours of wearing the monitor. Avoid excessive sweating to help maximize wear time. Do not submerge the device, no hot tubs, and no swimming pools. Keep any lotions or oils away from the patch. After 24 hours you may shower with the patch on. Take brief showers with your back facing the shower head.  Do not remove patch once it has been placed because that will  interrupt data and decrease adhesive wear time. Push the button when you have any symptoms and write down what you were feeling. Once you have completed wearing your monitor, remove and place into box which has postage paid and place in your outgoing mailbox.  If for some reason you have misplaced your box then call our office and we can provide another box and/or mail it off for you.   Follow-Up: At [facility], you and your health needs are our priority.  As part of our continuing mission to provide you with exceptional heart care, we have created designated Provider Care Teams.  These Care Teams include your primary Cardiologist (physician) and Advanced Practice Providers (APPs -  Physician Assistants and Nurse Practitioners) who all work together to provide you with the care you need, when you need it.   Your next appointment:   1 month(s)  The format for your next appointment:   In Person  Provider:   You may see Kathlyn Sacramento, MD or one of the following Advanced Practice Providers on your designated Care Team:   Murray Hodgkins, NP Christell Faith, PA-C Marrianne Mood, PA-C Cadence Bangor, [place]

## 2021-05-16 NOTE — Progress Notes (Signed)
Cardiology Office Note:    Date:  05/16/2021   ID:  Christopher Orr, DOB 10/05/1945, MRN [id number]  PCP:  McLean-Scocuzza, [name], MD  [facility] Cardiologist:  Kathlyn Sacramento, MD  [facility] Electrophysiologist:  None   Referring MD: McLean-Scocuzza, Olivia Mackie *   Chief Complaint: 2 month follow-up  History of Present Illness:    Christopher Orr is a 74 y.o. male with a hx of CAD s/p LAD stenting in 2006 it Cypher DES, tobacco/marijuana, alcohol use, COPD, HLD, obesity, and hepatitis C (treated), mild dementia who presents for follow-up.    Repat cath in 2011 showed patent stent with mild restenosis. CT in 2019 that showed no aortic aneuryms. Patient was diagnosed with squamous cell cancer of the lung in 2021  and underwent robotic assisted wedge resection. He did not require chemo or radiation. Plans to follow up with oncology. Patient was seen 02/2020 and was stable from a cardiac perspective. Plan to indefinitley continue DAPT with Aspirin and plavix.   PCP note and he recorded a syncope event. 2 months ago the patient passed out at home. He Got up and fell out on the floor. Went to the ER but work-up was unremarkable. Still drinking alcohol daily. Also smoking tobacco and weed. Orthostatics today negative.    Syncopal episode 12/2020 and went to the ER. Syncope in the setting of ETOH use, poor PO intake, narco and benzo use, dehydration, cirrhosis, and Flomax use. Episode sounded orthostatic in nature. ER work-up was unremarkable. Referred by PCP for dizziness and seen 12/21/20 Toprol was decreased. Lasix was discontinued. Echo was obtained which LVEF 50-55%, G1DD and no significant valvular abnormalities.   Last seen 03/13/21 and was still drinking alcohol, son had recently passed away.orthostatics negative. Heart monitor was ordered.   Today,  the patient reports he has been doing well since the last visit. Heart monitor was reviewed. No palpitations, chest pain. Shortness of  breath from emphysema. Heart monitor showed 7.8%PAC burden. Unsure if he is taking BB, although it is on his medication list. No Lle, orthopnea, pnd. Drinking alcohol, 1 pint a night. Plans on decreasing alcohol, will follow this. No further episodes of syncope or LOC.   Past Medical History:  Diagnosis Date   Alcohol abuse    pt states it is marijuana   Anemia    Arthritis    Bipolar disorder (HCC)    BPH (benign prostatic hypertrophy) with urinary obstruction    CAD (coronary artery disease)    a. s/p stent to LAD in 2006 at [facility], EF 60% at that time.   Cancer [facility])    prostate   CHF (congestive heart failure) (HCC)    Chronic pancreatitis (HCC)    Cirrhosis (HCC)    COPD (chronic obstructive pulmonary disease) ([place])    COVID-19    08/22/20   Dementia ([place])    early dementia   Depression    Diverticulitis    12/06/20 [facility]    Diverticulosis    Drug use    Dyspnea    ED (erectile dysfunction)    Gastritis    GERD (gastroesophageal reflux disease)    Headache    occasional migraines   Hepatitis C    treated   History of lumbar fusion    Hyperlipidemia    Hypertension    patient denies having high blood pressure   Lung cancer (HCC)    Mitral regurgitation    Pancreatitis    x 2  Pneumonia    07/06/18    PTSD (post-traumatic stress disorder)    Rib fracture    s/p fall 03/15/19    Sinus disease    Urge incontinence     Past Surgical History:  Procedure Laterality Date   CARDIAC CATHETERIZATION  2006   cateract  2013   COLONOSCOPY WITH PROPOFOL N/A 10/04/2019   Procedure: COLONOSCOPY WITH PROPOFOL;  Surgeon: Lucilla Lame, MD;  Location[facility]COPY;  Service: Endoscopy;  Laterality: N/A;   CORONARY ANGIOPLASTY  2006   CORONARY STENT PLACEMENT  2006   x 1   ESOPHAGOGASTRODUODENOSCOPY N/A 12/22/2014   Procedure: ESOPHAGOGASTRODUODENOSCOPY (EGD);  Surgeon: [name], MD;  Location: [facility] ENDOSCOPY;  Service: Endoscopy;  Laterality: N/A;    ESOPHAGOGASTRODUODENOSCOPY (EGD) WITH PROPOFOL N/A 10/04/2019   Procedure: ESOPHAGOGASTRODUODENOSCOPY (EGD) WITH PROPOFOL;  Surgeon: Lucilla Lame, MD;  Location: [facility] ENDOSCOPY;  Service: Endoscopy;  Laterality: N/A;   HERNIA REPAIR  2015   left groin   INTERCOSTAL NERVE BLOCK Left 11/09/2019   Procedure: Intercostal Nerve Block;  Surgeon: Melrose Nakayama, MD;  Location: [place];  Service: Thoracic;  Laterality: Left;   LUMBAR FUSION     RADIOACTIVE SEED IMPLANT N/A 04/22/2016   Procedure: RADIOACTIVE SEED IMPLANT/BRACHYTHERAPY IMPLANT;  Surgeon: Hollice Espy, MD;  Location: [facility] ORS;  Service: Urology;  Laterality: N/A;   ROBOT ASSISTED INGUINAL HERNIA REPAIR Bilateral 07/06/2018   Procedure: ROBOT ASSISTED INGUINAL HERNIA REPAIR;  Surgeon: Jules Husbands, MD;  Location: [facility] ORS;  Service: General;  Laterality: Bilateral;   TONSILLECTOMY     UMBILICAL HERNIA REPAIR N/A 07/06/2018   Procedure: LAPAROSCOPIC ROBOT ASSISTED UMBILICAL HERNIA;  Surgeon: Jules Husbands, MD;  Location: [facility] ORS;  Service: General;  Laterality: N/A;   XI ROBOTIC ASSISTED THORACOSCOPY- SEGMENTECTOMY Left 11/09/2019   Procedure: XI ROBOTIC ASSISTED THORACOSCOPY-WEDGE RESECTION LEFT LOWER LOBE;  Surgeon: Melrose Nakayama, MD;  Location: [facility] OR;  Service: Thoracic;  Laterality: Left;    Current Medications: Current Meds  Medication Sig   albuterol (PROVENTIL) (2.5 MG/3ML) 0.083% nebulizer solution TAKE 3 MLS BY NEBULIZATION EVERY 4 (FOUR) HOURS AS NEEDED FOR WHEEZING OR SHORTNESS OF BREATH.   albuterol (VENTOLIN HFA) 108 (90 Base) MCG/ACT inhaler Inhale 1-2 puffs into the lungs every 6 (six) hours as needed for wheezing or shortness of breath.    alprazolam (XANAX) 1 MG tablet Take 1 tablet (1 mg total) by mouth as directed. 1/2 in am 1 pill qhs   ARIPiprazole (ABILIFY) 10 MG tablet Take 10 mg by mouth daily.   aspirin EC 81 MG tablet Take 81 mg by mouth daily.   Budeson-Glycopyrrol-Formoterol (BREZTRI  AEROSPHERE) 160-9-4.8 MCG/ACT AERO Inhale 2 puffs into the lungs in the morning and at bedtime.   celecoxib (CELEBREX) 200 MG capsule TAKE 1 CAPSULE (200 MG TOTAL) BY MOUTH DAILY AFTER BREAKFAST.   clopidogrel (PLAVIX) 75 MG tablet TAKE 1 TABLET (75 MG TOTAL) BY MOUTH DAILY.   donepezil (ARICEPT) 5 MG tablet Take 1 tablet (5 mg total) by mouth at bedtime. Further refills per [facility] neurology   FLUoxetine (PROZAC) 20 MG capsule Take 60 mg by mouth daily.   hydrOXYzine (ATARAX/VISTARIL) 25 MG tablet Take 25 mg by mouth daily as needed.   memantine (NAMENDA) 5 MG tablet Take 1 tablet (5 mg total) by mouth 2 (two) times daily. Taking qd   metoprolol succinate (TOPROL-XL) 25 MG 24 hr tablet Take 1 tablet (25 mg total) by mouth daily.   Multiple Vitamins-Minerals (MULTIVITAMIN  WITH MINERALS) tablet Take 1 tablet by mouth daily.   MYRBETRIQ 50 MG TB24 tablet TAKE 1 TABLET BY MOUTH DAILY   NEOMYCIN-POLYMYXIN-HYDROCORTISONE (CORTISPORIN) 1 % SOLN OTIC solution Apply to nail beds from procedure site twice daily after soaks   nitroGLYCERIN (NITROSTAT) 0.4 MG SL tablet Place 1 tablet (0.4 mg total) under the tongue every 5 (five) minutes as needed for chest pain. Maximum of 3 doses   ondansetron (ZOFRAN) 4 MG tablet Take 1 tablet (4 mg total) by mouth every 8 (eight) hours as needed.   pantoprazole (PROTONIX) 40 MG tablet Take 1 tablet (40 mg total) by mouth daily. 30 min before breakfast   rosuvastatin (CRESTOR) 10 MG tablet Take 1 tablet (10 mg total) by mouth at bedtime.   sucralfate (CARAFATE) 1 g tablet Take 1 tablet (1 g total) by mouth 4 (four) times daily -  with meals and at bedtime. Stir in [employer] of liquid and let dissolve   SYMBICORT 160-4.5 MCG/ACT inhaler INHALE 2 PUFFS INTO THE LUNGS 2 (TWO) TIMES DAILY. RINSE MOUTH OUT   tamsulosin (FLOMAX) 0.4 MG CAPS capsule TAKE 1 CAPSULE (0.4 MG TOTAL) BY MOUTH DAILY AFTER SUPPER.   [DISCONTINUED] metoprolol succinate (TOPROL-XL) 25 MG 24 hr tablet Take 25 mg by  mouth daily.     Allergies:   Patient has no known allergies.   Social History   Socioeconomic History   Marital status: Divorced    Spouse name: Not on file   Number of children: 4   Years of education: Not on file   Highest education level: GED or equivalent  Occupational History   Occupation: Aeronautical engineer    Comment: retired  Tobacco Use   Smoking status: Every Day    Packs/day: 1.00    Years: 3.00    Pack years: 3.00    Types: Cigarettes    Start date: 07/28/1962   Smokeless tobacco: Never   Tobacco comments:    started back in march. 1 pack a day.  Vaping Use   Vaping Use: Never used  Substance and Sexual Activity   Alcohol use: Yes    Alcohol/week: 1.0 standard drink    Types: 1 Cans of beer per week    Comment: 2-3 beers a day   Drug use: Yes    Frequency: 7.0 times per week    Types: Marijuana    Comment: Endorsed heroin 06/2014, UDS also + benzos, opiates, THC, neg for cocaine at that time.   Sexual activity: Not Currently  Other Topics Concern   Not on file  Social History Narrative   Lives at home    Has kids and grandkids   Smoker x 55 years 1 ppd as of 07/2019 he did quit x 2 years    Drinks 1 pt liq qd as of 07/2019    Social Determinants of Health   Financial Resource Strain: Low Risk    Difficulty of Paying Living Expenses: Not hard at all  Food Insecurity: No Food Insecurity   Worried About Charity fundraiser in the Last Year: Never true   Arboriculturist in the Last Year: Never true  Transportation Needs: No Transportation Needs   Lack of Transportation (Medical): No   Lack of Transportation (Non-Medical): No  Physical Activity: Not on file  Stress: No Stress Concern Present   Feeling of Stress : Not at all  Social Connections: Moderately Isolated   Frequency of Communication with Friends and Family: More than  three times a week   Frequency of Social Gatherings with Friends and Family: Once a week   Attends Religious Services: Never    Marine scientist or Organizations: Yes   Attends Music therapist: More than 4 times per year   Marital Status: Divorced     Family History: The patient's family history includes Alcohol abuse in his sister; Anxiety disorder in his sister; COPD in his brother; Colon cancer in his father; Depression in his sister; Drug abuse in his sister; Heart disease in his father; Obesity in his brother. There is no history of Kidney disease or Prostate cancer.  ROS:   Please see the history of present illness.     All other systems reviewed and are negative.  EKGs/Labs/Other Studies Reviewed:    The following studies were reviewed today:  Heart monitor 03/2021 Patch Wear Time:  13 days and 18 hours (2022-08-17T15:47:07-398 to 2022-08-31T10:34:20-0400)   Patient had a min HR of 51 bpm, max HR of 203 bpm, and avg HR of 71 bpm.  Predominant underlying rhythm was Sinus Rhythm.  25 Supraventricular Tachycardia runs occurred, the run with the fastest interval lasting 4 beats with a max rate of 203 bpm, the longest lasting 14 beats with an avg rate of 147 bpm. Frequent PACs with a burden of 7.8%. Rare PVCs.  Echo 5/24/222  1. Left ventricular ejection fraction, by estimation, is 50 to 55%. The  left ventricle has low normal function. Left ventricular endocardial  border not optimally defined to evaluate regional wall motion. There is  moderate left ventricular hypertrophy.  Left ventricular diastolic parameters are consistent with Grade I  diastolic dysfunction (impaired relaxation). The average left ventricular  global longitudinal strain is -16.5 %.   2. Right ventricular systolic function is normal. The right ventricular  size is mildly enlarged.   3. Left atrial size was mildly dilated.   4. Right atrial size was mildly dilated.   5. The mitral valve was not well visualized. No evidence of mitral valve  regurgitation. No evidence of mitral stenosis.   6. The aortic valve  is tricuspid. Aortic valve regurgitation is not  visualized. No aortic stenosis is present.   7. Aortic dilatation noted. There is mild dilatation of the aortic root,  measuring 44 mm. There is mild dilatation of the ascending aorta,  measuring 40 mm. There is mild dilatation of the aortic arch, measuring 37  mm.   Comparison(s): EF 60-65%, RVSP 34.8 mmHg, mild to moderate TR.   EKG:  EKG is not ordered today.   Recent Labs: 10/30/2020: Magnesium 2.0 11/20/2020: ALT 22 01/21/2021: BUN 25; Creatinine, Ser 1.22; Hemoglobin 12.2; Platelets 303; Potassium 4.6; Sodium 138  Recent Lipid Panel    Component Value Date/Time   CHOL 111 05/21/2020 1051   TRIG 77 05/21/2020 1051   HDL 49 05/21/2020 1051   CHOLHDL 2.3 05/21/2020 1051   VLDL 15 05/21/2020 1051   LDLCALC 47 05/21/2020 1051     Physical Exam:    VS:  BP 138/68 (BP Location: Left Arm, Patient Position: Sitting, Cuff Size: Normal)   Pulse 84   Ht 6' (1.829 m)   Wt 246 lb 8 oz (111.8 kg)   SpO2 96%   BMI 33.43 kg/m     Wt Readings from Last 3 Encounters:  05/16/21 246 lb 8 oz (111.8 kg)  04/17/21 245 lb (111.1 kg)  03/13/21 245 lb 2 oz (111.2 kg)     GEN:  Well nourished, well developed in no acute distress HEENT: Normal NECK: No JVD; No carotid bruits LYMPHATICS: No lymphadenopathy CARDIAC: RRR, no murmurs, rubs, gallops RESPIRATORY:  Clear to auscultation without rales, wheezing or rhonchi  ABDOMEN: Soft, non-tender, non-distended MUSCULOSKELETAL:  No edema; No deformity  SKIN: Warm and dry NEUROLOGIC:  Alert and oriented x 3 PSYCHIATRIC:  Normal affect   ASSESSMENT:    1. Syncope, unspecified syncope type   2. Chronic obstructive pulmonary disease, unspecified COPD type ([place])   3. Coronary artery disease involving native coronary artery of native heart without angina pectoris   4. Chronic diastolic heart failure ([place])   5. Hyperlipidemia, mixed   6. Tobacco use   7. Dizziness   8. Alcohol use    PLAN:     In order of problems listed above:  Syncope Dizziness Orthostatic symptoms Toprol and lasix were previously stopped for dizziness and orthostatic symptoms. He had prior syncopal episode in 12/2020, seen in the ER, in the setting of ETOH use, poor orla intake, narco and benzo use. Prior echo showed normal pump function. Heart monitor showed predominately NSR avg HR 71bpm, 25 runs of SVT longest 14 beats, PAC burden 7.8%, rare PVCs. Orthostatics previously negative. Since then he reports no further syncopal episodes. He is still drinking alcohol daily. BP today is good.   PACs 7.8% burden seen on recent heart monitor. Notes say BB discontined for dizziness, however it is still on med list, unsure if he is taking it. We will try Torpol-XL 25mg  daily. Repeat 1 week heart monitor. Recommend he monitor for dizziness. May been referral to EP for PACs.    CAD s/p DES to LAD 2006 Patient denies anginal symptoms. Cath in 2011 showed patent stent with minimal ISR. Recommendations were fro indefinite DAPT with ASA and plavix.  Continue ASA, Plavix, statin. Re-trial BB as above. No further work-up at this time.    HFpEF Echo 11/2020 showed normal pump function. He is euvolemic on exam. Resume BB as above, monitor for dizziness.    HLD LDL 47 in 04/2020. Continue Crestor.  Tobacco use/COPD He reports unchanged dyspnea on exertion.   Polysubtance abuse/Alcohol use Continued alcohol use. Also uses narcotics and benzos. Cessation encouraged.   Disposition: Follow up in 1 month(s) with MD/APP     Signed, Tylin Force Ninfa Meeker, PA-C  05/16/2021 3:58 PM    [facility]

## 2021-05-23 ENCOUNTER — Inpatient Hospital Stay: Payer: Medicare HMO | Attending: Radiation Oncology

## 2021-05-23 ENCOUNTER — Other Ambulatory Visit: Payer: Self-pay

## 2021-05-23 DIAGNOSIS — C61 Malignant neoplasm of prostate: Secondary | ICD-10-CM | POA: Insufficient documentation

## 2021-05-23 LAB — PSA: Prostatic Specific Antigen: 0.13 ng/mL (ref 0.00–4.00)

## 2021-05-30 DIAGNOSIS — I2510 Atherosclerotic heart disease of native coronary artery without angina pectoris: Secondary | ICD-10-CM | POA: Diagnosis not present

## 2021-05-30 DIAGNOSIS — R55 Syncope and collapse: Secondary | ICD-10-CM | POA: Diagnosis not present

## 2021-05-30 DIAGNOSIS — I25119 Atherosclerotic heart disease of native coronary artery with unspecified angina pectoris: Secondary | ICD-10-CM | POA: Diagnosis not present

## 2021-05-30 DIAGNOSIS — I5032 Chronic diastolic (congestive) heart failure: Secondary | ICD-10-CM | POA: Diagnosis not present

## 2021-05-31 ENCOUNTER — Ambulatory Visit: Payer: Medicare HMO | Admitting: Radiation Oncology

## 2021-05-31 ENCOUNTER — Ambulatory Visit
Admission: RE | Admit: 2021-05-31 | Discharge: 2021-05-31 | Disposition: A | Discharge status: Home / Self Care | Payer: Medicare HMO | Source: Ambulatory Visit | Attending: Radiation Oncology | Admitting: Radiation Oncology

## 2021-05-31 ENCOUNTER — Other Ambulatory Visit: Payer: Self-pay

## 2021-05-31 VITALS — BP 118/76 | HR 75 | Temp 97.2°F | Resp 20 | Wt 247.6 lb

## 2021-05-31 DIAGNOSIS — C61 Malignant neoplasm of prostate: Secondary | ICD-10-CM | POA: Insufficient documentation

## 2021-05-31 DIAGNOSIS — R351 Nocturia: Secondary | ICD-10-CM | POA: Diagnosis not present

## 2021-05-31 DIAGNOSIS — Z923 Personal history of irradiation: Secondary | ICD-10-CM | POA: Insufficient documentation

## 2021-05-31 NOTE — Progress Notes (Signed)
Radiation Oncology Follow up Note  Name: Christopher Orr   Date:   05/31/2021 MRN:  [id number] DOB: 1946-01-02    This 75 y.o. male presents to the clinic today for 5-year follow-up status post I-125 interstitial implant for Gleason 6 adenocarcinoma the prostate.  REFERRING PROVIDER: McLean-Scocuzza, Olivia Mackie *  HPI: Patient is a 75 year old male now out 5 years having completed I-125 interstitial implant for Gleason 6 (3+3) adenocarcinoma the prostate.  Seen today in routine follow-up he is doing well s.  He is having some decreased stream as well as significant nocturia.  He is taking his Flomax although not in the evening.  He is also currently on Myrbetriq.  He is having no diarrhea or fatigue.  His PSA remains under excellent control at 0.13.  Patient has had an MRI scan of the abdomen showing a Bosniak category 40F lesion of the right kidney being followed by urology COMPLICATIONS OF TREATMENT: none  FOLLOW UP COMPLIANCE: keeps appointments   PHYSICAL EXAM:  BP 118/76   Pulse 75   Temp (!) 97.2 F (36.2 C) (Tympanic)   Resp 20   Wt 247 lb 9.6 oz (112.3 kg)   BMI 33.58 kg/m  Well-developed well-nourished patient in NAD. HEENT reveals PERLA, EOMI, discs not visualized.  Oral cavity is clear. No oral mucosal lesions are identified. Neck is clear without evidence of cervical or supraclavicular adenopathy. Lungs are clear to A&P. Cardiac examination is essentially unremarkable with regular rate and rhythm without murmur rub or thrill. Abdomen is benign with no organomegaly or masses noted. Motor sensory and DTR levels are equal and symmetric in the upper and lower extremities. Cranial nerves II through XII are grossly intact. Proprioception is intact. No peripheral adenopathy or edema is identified. No motor or sensory levels are noted. Crude visual fields are within normal range.  RADIOLOGY RESULTS: MRI of the abdomen reviewed compatible with above-stated findings PLAN: Present time patient  is doing fairly well.  Of asked him to return to Dr. Erlene Quan for evaluation of some of his urinary symptoms.  I am going to discontinue follow-up care since were now 5 years out with excellent biochemical control.  Patient knows to call with any concerns at any time.  I would like to take this opportunity to thank you for allowing me to participate in the care of your patient.Noreene Filbert, MD

## 2021-06-03 ENCOUNTER — Telehealth: Payer: Self-pay | Admitting: Medical

## 2021-06-03 NOTE — Telephone Encounter (Signed)
Furth, Cadence H, PA-C  P Cv Div [facility]url Triage Heart monitor showed predominately NSR, 42 runs of SVT with the fastest interval 5 beats. Frequent PACs with a 8% burden. Lets confirm he has been taking toprol 25mg  daily. Lets increase this to 50mg  daily.

## 2021-06-03 NOTE — Telephone Encounter (Signed)
Called to give the patient monitor results. Unable to lmom. Pt voicemail is full.

## 2021-06-04 ENCOUNTER — Other Ambulatory Visit: Payer: Self-pay | Admitting: Internal Medicine

## 2021-06-04 DIAGNOSIS — R112 Nausea with vomiting, unspecified: Secondary | ICD-10-CM

## 2021-06-04 MED ORDER — METOPROLOL SUCCINATE ER 50 MG PO TB24: 50.0000 mg | ORAL_TABLET | Freq: Every day | ORAL | 1 refills | Status: DC

## 2021-06-04 NOTE — Telephone Encounter (Signed)
Called and spoke to patient and gave him the monitor results. Patient verbalized understanding and agreed with plan. New prescription sent in for Toprol XL 50 MG.

## 2021-06-13 ENCOUNTER — Ambulatory Visit
Admission: RE | Admit: 2021-06-13 | Discharge: 2021-06-13 | Disposition: A | Discharge status: Home / Self Care | Payer: Medicare HMO | Source: Ambulatory Visit | Attending: Oncology | Admitting: Oncology

## 2021-06-13 ENCOUNTER — Other Ambulatory Visit: Payer: Self-pay

## 2021-06-13 DIAGNOSIS — Z08 Encounter for follow-up examination after completed treatment for malignant neoplasm: Secondary | ICD-10-CM | POA: Insufficient documentation

## 2021-06-13 DIAGNOSIS — C3490 Malignant neoplasm of unspecified part of unspecified bronchus or lung: Secondary | ICD-10-CM | POA: Diagnosis not present

## 2021-06-13 DIAGNOSIS — I700 Atherosclerosis of aorta: Secondary | ICD-10-CM | POA: Diagnosis not present

## 2021-06-13 DIAGNOSIS — Z85118 Personal history of other malignant neoplasm of bronchus and lung: Secondary | ICD-10-CM | POA: Diagnosis not present

## 2021-06-13 DIAGNOSIS — J439 Emphysema, unspecified: Secondary | ICD-10-CM | POA: Diagnosis not present

## 2021-06-13 DIAGNOSIS — J9811 Atelectasis: Secondary | ICD-10-CM | POA: Diagnosis not present

## 2021-06-14 ENCOUNTER — Ambulatory Visit: Payer: Medicare HMO | Admitting: Medical

## 2021-06-14 ENCOUNTER — Telehealth: Payer: Self-pay | Admitting: Internal Medicine

## 2021-06-14 ENCOUNTER — Encounter: Payer: Self-pay | Admitting: Medical

## 2021-06-14 VITALS — BP 108/68 | HR 69 | Ht 72.0 in | Wt 254.0 lb

## 2021-06-14 DIAGNOSIS — I25119 Atherosclerotic heart disease of native coronary artery with unspecified angina pectoris: Secondary | ICD-10-CM

## 2021-06-14 DIAGNOSIS — Z789 Other specified health status: Secondary | ICD-10-CM | POA: Diagnosis not present

## 2021-06-14 DIAGNOSIS — I491 Atrial premature depolarization: Secondary | ICD-10-CM

## 2021-06-14 DIAGNOSIS — J449 Chronic obstructive pulmonary disease, unspecified: Secondary | ICD-10-CM | POA: Diagnosis not present

## 2021-06-14 DIAGNOSIS — F1090 Alcohol use, unspecified, uncomplicated: Secondary | ICD-10-CM

## 2021-06-14 DIAGNOSIS — I5032 Chronic diastolic (congestive) heart failure: Secondary | ICD-10-CM | POA: Diagnosis not present

## 2021-06-14 DIAGNOSIS — I739 Peripheral vascular disease, unspecified: Secondary | ICD-10-CM | POA: Diagnosis not present

## 2021-06-14 DIAGNOSIS — E782 Mixed hyperlipidemia: Secondary | ICD-10-CM

## 2021-06-14 NOTE — Telephone Encounter (Signed)
Pt called in requesting a prescription for medication (traZODone (DESYREL) 150 MG tablet). Pt stated that [facility] is no longer accepting his insurance. Pt stated New Pharmacy is [facility] on [address] in [place], [place].

## 2021-06-14 NOTE — Patient Instructions (Addendum)
Medication Instructions:  - Your physician recommends that you continue on your current medications as directed. Please refer to the Current Medication list given to you today.  *If you need a refill on your cardiac medications before your next appointment, please call your pharmacy*   Lab Work: - none ordered  If you have labs (blood work) drawn today and your tests are completely normal, you will receive your results only by: [place] (if you have MyChart) OR A paper copy in the mail If you have any lab test that is abnormal or we need to change your treatment, we will call you to review the results.   Testing/Procedures: 1) You have been referred to : Cardiac Electrophysiology- Dr. Caryl Comes or Dr. Quentin Ore- for evaluation of Premature Atrial Contractions (PACs)    2) Your physician has requested that you have an ankle brachial index (ABI). During this test an ultrasound and blood pressure cuff are used to evaluate the arteries that supply the arms and legs with blood. Allow thirty minutes for this exam. There are no restrictions or special instructions.  Your physician has requested that you have a lower extremity arterial duplex. This test is an ultrasound of the arteries in the legs. It looks at arterial blood flow in the legs. Allow one hour for Lower Arterial scans. There are no restrictions or special instructions. This portion of the test will only be done if the ankle brachial index (ABI) is abnormal.    Follow-Up: At [facility], you and your health needs are our priority.  As part of our continuing mission to provide you with exceptional heart care, we have created designated Provider Care Teams.  These Care Teams include your primary Cardiologist (physician) and Advanced Practice Providers (APPs -  Physician Assistants and Nurse Practitioners) who all work together to provide you with the care you need, when you need it.  We recommend signing up for the patient portal  called "MyChart".  Sign up information is provided on this After Visit Summary.  MyChart is used to connect with patients for Virtual Visits (Telemedicine).  Patients are able to view lab/test results, encounter notes, upcoming appointments, etc.  Non-urgent messages can be sent to your provider as well.   To learn more about what you can do with MyChart, go to [email or web address].    Your next appointment:   3 month(s)  The format for your next appointment:   In Person  Provider:   You may see Kathlyn Sacramento, MD or one of the following Advanced Practice Providers on your designated Care Team:   Murray Hodgkins, NP Christell Faith, PA-C Cadence Kathlen Mody, [place]    Other Instructions  Ankle-Brachial Index Test Why am I having this test? The ankle-brachial index (ABI) test is used to diagnose peripheral vascular disease (PVD). PVD is also known as peripheral arterial disease (PAD). PVD is the blocking or hardening of the arteries anywhere within the circulatory system beyond the heart. It is a form of cardiovascular disease. PVD is caused by: Cholesterol deposits in your blood vessels (atherosclerosis). This is the most common cause of this condition. Inflammation in the blood vessels. Blood clots in the vessels. Cholesterol deposits cause arteries to narrow. Decreased delivery of oxygen to your tissues causes muscle pain, cramping, and fatigue that occurs during exercise and is relieved by rest. This is called intermittent claudication. PVD means that there may also be buildup of cholesterol: In your heart. This raises the risk of heart attacks. In your brain.  This raises the risk of strokes. What is being tested? The ankle-brachial index test measures the blood flow in your arms and legs. The blood flow will show if blood vessels in your legs have been narrowed by cholesterol deposits. How do I prepare for this test? Wear loose, comfortable clothing with shoes that are easy to remove. Do  not use any products that contain nicotine or tobacco for at least 30 minutes before the test. These products include cigarettes, chewing tobacco, and vaping devices, such as e-cigarettes. Avoid caffeine, alcohol, and heavy activity an hour before the test. What happens during the test?  You will lie down in a resting position with your legs and arms at heart level. Your health care provider will use a blood pressure machine and a small ultrasound device (Doppler) to measure the systolic pressures on your upper arms and ankles. Systolic pressure is the pressure inside your arteries when your heart pumps. Systolic pressures will be taken several times, and at several points, on both the ankle and the arm. Your health care provider may also measure your toe. Your health care provider will divide the highest systolic pressure of the ankle by the highest systolic pressure of the arm. The result is the ankle-brachial pressure ratio, or ABI. Sometimes this test will be repeated after you have exercised on a treadmill for 5 minutes. You may have leg pain during the exercise portion of the test if you suffer from PVD. If the index number drops after exercise, this may show that PVD is present. How are the results reported? Your test results will be reported as a value that shows the ratio of your ankle pressure to your arm pressure (ABI ratio). Your health care provider will compare your results to normal ranges that were established after testing a large group of people (reference ranges). These ranges may vary among labs and hospitals. For this test, an abnormal reading is typically considered, which is an ABI ratio of 0.9 or less. What do the results mean? An ABI ratio that is below the reference range is considered abnormal and may indicate PVD in the legs. Talk with your health care provider about what your results mean. Questions to ask your health care provider Ask your health care provider, or the  department that is doing the test: When will my results be ready? How will I get my results? What are my treatment options? What other tests do I need? What are my next steps? Summary The ankle-brachial index (ABI) test is used to diagnose peripheral vascular disease (PVD). PVD is also known as peripheral arterial disease (PAD). The ABI test measures the blood flow in your arms and legs. The highest systolic pressure of the ankle is divided by the highest systolic pressure of the arm. The result is the ABI ratio. An ABI ratio of 0.9 or less is considered abnormal and may indicate PVD in the legs. This information is not intended to replace advice given to you by your health care provider. Make sure you discuss any questions you have with your health care provider. Document Revised: 01/16/2020 Document Reviewed: 01/16/2020 Elsevier Patient Education  [place].

## 2021-06-14 NOTE — Progress Notes (Signed)
Cardiology Office Note:    Date:  06/14/2021   ID:  JAE SKEET, DOB 1946/05/09, MRN [id number]  PCP:  McLean-Scocuzza, [name], MD  [facility] Cardiologist:  Kathlyn Sacramento, MD  [facility] Electrophysiologist:  None   Referring MD: McLean-Scocuzza, Olivia Mackie *   Chief Complaint: 2 month follow-up  History of Present Illness:    Christopher Orr is a 75 y.o. male with a hx of CAD s/p LAD stenting in 2006 it Cypher DES, tobacco/marijuana, alcohol use, COPD, HLD, obesity, and hepatitis C (treated), mild dementia who presents for follow-up.    Repat cath in 2011 showed patent stent with mild restenosis. CT in 2019 that showed no aortic aneuryms. Patient was diagnosed with squamous cell cancer of the lung in 2021  and underwent robotic assisted wedge resection. He did not require chemo or radiation. Plans to follow up with oncology. Patient was seen 02/2020 and was stable from a cardiac perspective. Plan to indefinitley continue DAPT with Aspirin and plavix.    PCP note and he recorded a syncope event. 2 months ago the patient passed out at home. He Got up and fell out on the floor. Went to the ER but work-up was unremarkable. Still drinking alcohol daily. Also smoking tobacco and weed. Orthostatics today negative.    Syncopal episode 12/2020 and went to the ER. Syncope in the setting of ETOH use, poor PO intake, narco and benzo use, dehydration, cirrhosis, and Flomax use. Episode sounded orthostatic in nature. ER work-up was unremarkable. Referred by PCP for dizziness and seen 12/21/20 Toprol was decreased. Lasix was discontinued. Echo was obtained which LVEF 50-55%, G1DD and no significant valvular abnormalities.    Seen 03/13/21 and was still drinking alcohol, son had recently passed away.orthostatics negative. Heart monitor was ordered.   Last seen 05/16/21 and Heart monitor showed 7.8%PAC burden. Unsure if he is taking BB. Toprol was restarted and 1 week heart monitor was ordered.    Today, the patient is unsure what he is taking, pharmacy arranges pills for him. Recent heart monitor showed 8% burden of PACs, he was supposed to be on Toprol 25mg  during that time. Toprol was then increased to 50mg  daily. No chest pain . Has chronic shortness of breath. He is still drinking 2-3 twisted teas a night. Doesn't plan on cutting down anymore on alcohol use. Reports claudication symptoms on the left lower extremity.   Past Medical History:  Diagnosis Date   Alcohol abuse    pt states it is marijuana   Anemia    Arthritis    Bipolar disorder (HCC)    BPH (benign prostatic hypertrophy) with urinary obstruction    CAD (coronary artery disease)    a. s/p stent to LAD in 2006 at [facility], EF 60% at that time.   Cancer [facility])    prostate   CHF (congestive heart failure) (HCC)    Chronic pancreatitis (HCC)    Cirrhosis (HCC)    COPD (chronic obstructive pulmonary disease) ([place])    COVID-19    08/22/20   Dementia ([place])    early dementia   Depression    Diverticulitis    12/06/20 [facility]    Diverticulosis    Drug use    Dyspnea    ED (erectile dysfunction)    Gastritis    GERD (gastroesophageal reflux disease)    Headache    occasional migraines   Hepatitis C    treated   History of lumbar fusion  Hyperlipidemia    Hypertension    patient denies having high blood pressure   Lung cancer (HCC)    Mitral regurgitation    Pancreatitis    x 2    Pneumonia    07/06/18    PTSD (post-traumatic stress disorder)    Rib fracture    s/p fall 03/15/19    Sinus disease    Urge incontinence     Past Surgical History:  Procedure Laterality Date   CARDIAC CATHETERIZATION  2006   cateract  2013   COLONOSCOPY WITH PROPOFOL N/A 10/04/2019   Procedure: COLONOSCOPY WITH PROPOFOL;  Surgeon: Lucilla Lame, MD;  [facility] END[facility]COPY;  Service: Endoscopy;  Laterality: N/A;   CORONARY ANGIOPLASTY  2006   CORONARY STENT PLACEMENT  2006   x 1   ESOPHAGOGASTRODUODENOSCOPY N/A 12/22/2014    Procedure: ESOPHAGOGASTRODUODENOSCOPY (EGD);  Surgeon: [name], MD;  Location: [facility] ENDOSCOPY;  Service: Endoscopy;  Laterality: N/A;   ESOPHAGOGASTRODUODENOSCOPY (EGD) WITH PROPOFOL N/A 10/04/2019   Procedure: ESOPHAGOGASTRODUODENOSCOPY (EGD) WITH PROPOFOL;  Surgeon: Lucilla Lame, MD;  Location: [facility] ENDOSCOPY;  Service: Endoscopy;  Laterality: N/A;   HERNIA REPAIR  2015   left groin   INTERCOSTAL NERVE BLOCK Left 11/09/2019   Procedure: Intercostal Nerve Block;  Surgeon: Melrose Nakayama, MD;  Location: [place];  Service: Thoracic;  Laterality: Left;   LUMBAR FUSION     RADIOACTIVE SEED IMPLANT N/A 04/22/2016   Procedure: RADIOACTIVE SEED IMPLANT/BRACHYTHERAPY IMPLANT;  Surgeon: Hollice Espy, MD;  Location: [facility] ORS;  Service: Urology;  Laterality: N/A;   ROBOT ASSISTED INGUINAL HERNIA REPAIR Bilateral 07/06/2018   Procedure: ROBOT ASSISTED INGUINAL HERNIA REPAIR;  Surgeon: Jules Husbands, MD;  Location: [facility] ORS;  Service: General;  Laterality: Bilateral;   TONSILLECTOMY     UMBILICAL HERNIA REPAIR N/A 07/06/2018   Procedure: LAPAROSCOPIC ROBOT ASSISTED UMBILICAL HERNIA;  Surgeon: Jules Husbands, MD;  Location: [facility] ORS;  Service: General;  Laterality: N/A;   XI ROBOTIC ASSISTED THORACOSCOPY- SEGMENTECTOMY Left 11/09/2019   Procedure: XI ROBOTIC ASSISTED THORACOSCOPY-WEDGE RESECTION LEFT LOWER LOBE;  Surgeon: Melrose Nakayama, MD;  Location: [facility] OR;  Service: Thoracic;  Laterality: Left;    Current Medications: Current Meds  Medication Sig   albuterol (PROVENTIL) (2.5 MG/3ML) 0.083% nebulizer solution TAKE 3 MLS BY NEBULIZATION EVERY 4 (FOUR) HOURS AS NEEDED FOR WHEEZING OR SHORTNESS OF BREATH.   albuterol (VENTOLIN HFA) 108 (90 Base) MCG/ACT inhaler Inhale 1-2 puffs into the lungs every 6 (six) hours as needed for wheezing or shortness of breath.    alprazolam (XANAX) 1 MG tablet Take 1 tablet (1 mg total) by mouth as directed. 1/2 in am 1 pill qhs   ARIPiprazole  (ABILIFY) 10 MG tablet Take 10 mg by mouth daily.   aspirin EC 81 MG tablet Take 81 mg by mouth daily.   Budeson-Glycopyrrol-Formoterol (BREZTRI AEROSPHERE) 160-9-4.8 MCG/ACT AERO Inhale 2 puffs into the lungs in the morning and at bedtime.   celecoxib (CELEBREX) 200 MG capsule TAKE 1 CAPSULE (200 MG TOTAL) BY MOUTH DAILY AFTER BREAKFAST.   clopidogrel (PLAVIX) 75 MG tablet TAKE 1 TABLET (75 MG TOTAL) BY MOUTH DAILY.   donepezil (ARICEPT) 5 MG tablet Take 1 tablet (5 mg total) by mouth at bedtime. Further refills per [facility] neurology   FLUoxetine (PROZAC) 20 MG capsule Take 60 mg by mouth daily.   hydrOXYzine (ATARAX/VISTARIL) 25 MG tablet Take 25 mg by mouth daily as needed.   memantine (NAMENDA) 5 MG tablet Take 1  tablet (5 mg total) by mouth 2 (two) times daily. Taking qd   metoprolol succinate (TOPROL-XL) 50 MG 24 hr tablet Take 1 tablet (50 mg total) by mouth daily.   Multiple Vitamins-Minerals (MULTIVITAMIN WITH MINERALS) tablet Take 1 tablet by mouth daily.   MYRBETRIQ 50 MG TB24 tablet TAKE 1 TABLET BY MOUTH DAILY   NEOMYCIN-POLYMYXIN-HYDROCORTISONE (CORTISPORIN) 1 % SOLN OTIC solution Apply to nail beds from procedure site twice daily after soaks   nitroGLYCERIN (NITROSTAT) 0.4 MG SL tablet Place 1 tablet (0.4 mg total) under the tongue every 5 (five) minutes as needed for chest pain. Maximum of 3 doses   ondansetron (ZOFRAN) 4 MG tablet Take 1 tablet (4 mg total) by mouth every 8 (eight) hours as needed.   pantoprazole (PROTONIX) 40 MG tablet Take 1 tablet (40 mg total) by mouth daily. 30 min before breakfast   rosuvastatin (CRESTOR) 10 MG tablet Take 1 tablet (10 mg total) by mouth at bedtime.   sucralfate (CARAFATE) 1 g tablet TAKE 1 TABLET FOUR TIMES A DAY   SYMBICORT 160-4.5 MCG/ACT inhaler INHALE 2 PUFFS INTO THE LUNGS 2 (TWO) TIMES DAILY. RINSE MOUTH OUT   tamsulosin (FLOMAX) 0.4 MG CAPS capsule TAKE 1 CAPSULE (0.4 MG TOTAL) BY MOUTH DAILY AFTER SUPPER.     Allergies:   Patient has  no known allergies.   Social History   Socioeconomic History   Marital status: Divorced    Spouse name: Not on file   Number of children: 4   Years of education: Not on file   Highest education level: GED or equivalent  Occupational History   Occupation: Aeronautical engineer    Comment: retired  Tobacco Use   Smoking status: Every Day    Packs/day: 1.00    Years: 3.00    Pack years: 3.00    Types: Cigarettes    Start date: 07/28/1962   Smokeless tobacco: Never   Tobacco comments:    started back in march. 1 pack a day.  Vaping Use   Vaping Use: Never used  Substance and Sexual Activity   Alcohol use: Yes    Alcohol/week: 1.0 standard drink    Types: 1 Cans of beer per week    Comment: 2-3 beers a day   Drug use: Yes    Frequency: 7.0 times per week    Types: Marijuana    Comment: Endorsed heroin 06/2014, UDS also + benzos, opiates, THC, neg for cocaine at that time.   Sexual activity: Not Currently  Other Topics Concern   Not on file  Social History Narrative   Lives at home    Has kids and grandkids   Smoker x 55 years 1 ppd as of 07/2019 he did quit x 2 years    Drinks 1 pt liq qd as of 07/2019    Social Determinants of Health   Financial Resource Strain: Low Risk    Difficulty of Paying Living Expenses: Not hard at all  Food Insecurity: No Food Insecurity   Worried About Charity fundraiser in the Last Year: Never true   Arboriculturist in the Last Year: Never true  Transportation Needs: No Transportation Needs   Lack of Transportation (Medical): No   Lack of Transportation (Non-Medical): No  Physical Activity: Not on file  Stress: No Stress Concern Present   Feeling of Stress : Not at all  Social Connections: Moderately Isolated   Frequency of Communication with Friends and Family: More than three  times a week   Frequency of Social Gatherings with Friends and Family: Once a week   Attends Religious Services: Never   Marine scientist or Organizations:  Yes   Attends Music therapist: More than 4 times per year   Marital Status: Divorced     Family History: The patient's family history includes Alcohol abuse in his sister; Anxiety disorder in his sister; COPD in his brother; Colon cancer in his father; Depression in his sister; Drug abuse in his sister; Heart disease in his father; Obesity in his brother. There is no history of Kidney disease or Prostate cancer.  ROS:   Please see the history of present illness.     All other systems reviewed and are negative.  EKGs/Labs/Other Studies Reviewed:    The following studies were reviewed today:  Echo 12/18/20  1. Left ventricular ejection fraction, by estimation, is 50 to 55%. The  left ventricle has low normal function. Left ventricular endocardial  border not optimally defined to evaluate regional wall motion. There is  moderate left ventricular hypertrophy.  Left ventricular diastolic parameters are consistent with Grade I  diastolic dysfunction (impaired relaxation). The average left ventricular  global longitudinal strain is -16.5 %.   2. Right ventricular systolic function is normal. The right ventricular  size is mildly enlarged.   3. Left atrial size was mildly dilated.   4. Right atrial size was mildly dilated.   5. The mitral valve was not well visualized. No evidence of mitral valve  regurgitation. No evidence of mitral stenosis.   6. The aortic valve is tricuspid. Aortic valve regurgitation is not  visualized. No aortic stenosis is present.   7. Aortic dilatation noted. There is mild dilatation of the aortic root,  measuring 44 mm. There is mild dilatation of the ascending aorta,  measuring 40 mm. There is mild dilatation of the aortic arch, measuring 37  mm.   Comparison(s): EF 60-65%, RVSP 34.8 mmHg, mild to moderate TR.   Long term heart monitor 05/2021 Patch Wear Time:  7 days and 19 hours (2022-10-20T14:16:09-398 to 2022-10-28T09:32:13-0400)    Patient had a min HR of 60 bpm, max HR of 210 bpm, and avg HR of 80 bpm.  Predominant underlying rhythm was Sinus Rhythm.  42 Supraventricular Tachycardia runs occurred, the run with the fastest interval lasting 5 beats with a max rate of 210 bpm, the longest lasting 5 beats with an avg rate of 116 bpm. Frequent PACs with a burden of 8% Rare PVCs.  EKG:  EKG is  ordered today.  The ekg ordered today demonstrates NSR, 69bpm, LAD, nonspecific T wave changes  Recent Labs: 10/30/2020: Magnesium 2.0 11/20/2020: ALT 22 01/21/2021: BUN 25; Creatinine, Ser 1.22; Hemoglobin 12.2; Platelets 303; Potassium 4.6; Sodium 138  Recent Lipid Panel    Component Value Date/Time   CHOL 111 05/21/2020 1051   TRIG 77 05/21/2020 1051   HDL 49 05/21/2020 1051   CHOLHDL 2.3 05/21/2020 1051   VLDL 15 05/21/2020 1051   LDLCALC 47 05/21/2020 1051     Physical Exam:    VS:  BP 108/68   Pulse 69   Ht 6' (1.829 m)   Wt 254 lb (115.2 kg)   SpO2 95%   BMI 34.45 kg/m     Wt Readings from Last 3 Encounters:  06/14/21 254 lb (115.2 kg)  05/31/21 247 lb 9.6 oz (112.3 kg)  05/16/21 246 lb 8 oz (111.8 kg)     GEN:  Well nourished, well developed in no acute distress HEENT: Normal NECK: No JVD; No carotid bruits LYMPHATICS: No lymphadenopathy CARDIAC: RRR, no murmurs, rubs, gallops RESPIRATORY:  Clear to auscultation without rales, wheezing or rhonchi  ABDOMEN: Soft, non-tender, non-distended MUSCULOSKELETAL:  No edema; No deformity  SKIN: Warm and dry NEUROLOGIC:  Alert and oriented x 3 PSYCHIATRIC:  Normal affect   ASSESSMENT:    1. PAC (premature atrial contraction)   2. Chronic diastolic heart failure ([place])   3. Coronary artery disease involving native coronary artery of native heart with angina pectoris ([place])   4. Chronic obstructive pulmonary disease, unspecified COPD type ([place])   5. Hyperlipidemia, mixed   6. Alcohol use   7. Claudication [facility])    PLAN:    In order of problems listed  above:  H/o syncope/dizziness Toprol and lasix were previously stopped for dizziness and orthostatic symptoms. He had prior syncopal episode in 12/2020, seen in the ER, in the setting of ETOH use, poor oral intake, narco and benzo use. Prior echo showed normal pump function. Heart monitor showed predominately NSR avg HR 71bpm, 25 runs of SVT longest 14 beats, PAC burden 7.8%, rare PVCs. Orthostatics previously negative. Since then he reports no further syncopal episodes. He is still drinking alcohol daily. BP today is good.   PACs Repeat heart monitor showed 8% PAC burden on Toprol 25mg  daily, this was then increased to 50mg  daily. Due to mild dementia reported by patient, he is unsure what he is taking. I will refer to EP for PACs.  CAD s/p DES to LAD in 2006 Patient denies chest pain. He has stable and chronic DOE. Cath in 2011 showed patent stent with minimal ISR. Recommendations were for indefinite DAPT with ASA and Plavix. Continue statin, BB, ASA and Plavix. No further ischemic work-up at this time.   HFpEF Appears euvolemic on exam.   HLD LDL 47 04/2020. Continue Crestor.   Tobacco use/COPD Polysubstance abuse/alcohol use Still drinking 3 alcoholic drinks nightly. Still smoking. He has stable DOE.  Claudication Reports claudication symptoms on the left lower extremity. Will get bilateral ABIs   Disposition: Follow up in 3 month(s) with MD/APP     Signed, Marion Seese Ninfa Meeker, PA-C  06/14/2021 3:06 PM    [facility]

## 2021-06-18 ENCOUNTER — Other Ambulatory Visit: Payer: Self-pay

## 2021-06-18 DIAGNOSIS — F419 Anxiety disorder, unspecified: Secondary | ICD-10-CM

## 2021-06-18 MED ORDER — TRAZODONE HCL 150 MG PO TABS: 150.0000 mg | ORAL_TABLET | Freq: Every day | ORAL | 1 refills | Status: DC

## 2021-06-18 NOTE — Telephone Encounter (Signed)
Pt called in requesting update on medication ((traZODone (DESYREL) 150 MG tablet) refill. Pt is requesting callback.

## 2021-06-18 NOTE — Telephone Encounter (Signed)
Medication sent in to preferred pharmacy per protocol.   Patient informed and verbalized understanding.

## 2021-06-21 ENCOUNTER — Other Ambulatory Visit: Payer: Medicare HMO

## 2021-06-21 ENCOUNTER — Ambulatory Visit: Payer: Medicare HMO | Admitting: Oncology

## 2021-06-24 NOTE — Progress Notes (Signed)
06/25/21 8:58 AM   Christopher Orr March 13, 1946 [id number]  Referring provider:  McLean-Scocuzza, [name], MD [place],  [place] [address] Chief Complaint  Patient presents with   Prostate Cancer     HPI: Christopher Orr is a 75 y.o.male with a personal history of prostate cancer, BPH with LUTS, urinary frequency, ED, renal cyst, and microscopic hematuria, who presents today for 1 year follow-up   He was diagnosed with prostate cancer on 12/20/2015. Pathology revealed low risk prostate cancer with Gleason 3+3 involving 7 our of 12 cores  including all cores on left and single core at right apex up to 70% tissue.  TRUS volume 42 cc. He is s/p brachytherapy seed implant on 04/22/2016  His most recent PSA as of 05/23/2021 was 0.13.   He underwent a cystoscopy in 12/20/2019 for further evaluation of his microscopic hematuria which was unremarkable.   He underwent an MRI on 12/27/2020 for further evaluation of lesion on the right  kidney. It showed that the lesion of concern in the right kidney anteriorly has reduced complexity compared to previously, and is currently categorized as a Bosniak category 2 complex but benign cyst. Bosniak category 1 and category 2 cysts are present in the kidneys.  He has been managed for urinary frequency for many years he was on Flomax and Mybetriq 50 mg. Contributing factors include alcohol abuse. He had a cystoscopy last year.  His urine today shows 11-30 RBCs and 6-10 WBCs.   He reports today that he experiences leakage. He reports that he has stopped drinking whiskey but he is drinking tea that contains alcohol at least 3-4 times nightly.   He is very bothered by his urinary symptoms.  He has also seen blood x 1.  No dyuria.    PSA trend:  Component Prostatic Specific Antigen  Latest Ref Rng & Units 0.00 - 4.00 ng/mL  04/02/2017 0.88  04/07/2018 0.46  05/18/2019 0.22  05/21/2020 <0.01  05/23/2020 <0.01  05/23/2021 0.13    IPSS      Row Name 06/25/21 0800         International Prostate Symptom Score   How often have you had the sensation of not emptying your bladder? Less than half the time     How often have you had to urinate less than every two hours? Almost always     How often have you found you stopped and started again several times when you urinated? More than half the time     How often have you found it difficult to postpone urination? Almost always     How often have you had a weak urinary stream? Almost always     How often have you had to strain to start urination? Not at All     How many times did you typically get up at night to urinate? 3 Times     Total IPSS Score 24       Quality of Life due to urinary symptoms   If you were to spend the rest of your life with your urinary condition just the way it is now how would you feel about that? Terrible              Score:  1-7 Mild 8-19 Moderate 20-35 Severe   PMH: Past Medical History:  Diagnosis Date   Alcohol abuse    pt states it is marijuana   Anemia    Arthritis    Bipolar  disorder ([place])    BPH (benign prostatic hypertrophy) with urinary obstruction    CAD (coronary artery disease)    a. s/p stent to LAD in 2006 at [facility], EF 60% at that time.   Cancer [facility])    prostate   CHF (congestive heart failure) (HCC)    Chronic pancreatitis (HCC)    Cirrhosis (HCC)    COPD (chronic obstructive pulmonary disease) ([place])    COVID-19    08/22/20   Dementia ([place])    early dementia   Depression    Diverticulitis    12/06/20 [facility]    Diverticulosis    Drug use    Dyspnea    ED (erectile dysfunction)    Gastritis    GERD (gastroesophageal reflux disease)    Headache    occasional migraines   Hepatitis C    treated   History of lumbar fusion    Hyperlipidemia    Hypertension    patient denies having high blood pressure   Lung cancer ([place])    Mitral regurgitation    Pancreatitis    x 2    Pneumonia    07/06/18    PTSD  (post-traumatic stress disorder)    Rib fracture    s/p fall 03/15/19    Sinus disease    Urge incontinence     Surgical History: Past Surgical History:  Procedure Laterality Date   CARDIAC CATHETERIZATION  2006   cateract  2013   COLONOSCOPY WITH PROPOFOL N/A 10/04/2019   Procedure: COLONOSCOPY WITH PROPOFOL;  Surgeon: Lucilla Lame, MD;  Location: [facility] ENDOSCOPY;  Service: Endoscopy;  Laterality: N/A;   CORONARY ANGIOPLASTY  2006   CORONARY STENT PLACEMENT  2006   x 1   ESOPHAGOGASTRODUODENOSCOPY N/A 12/22/2014   Procedure: ESOPHAGOGASTRODUODENOSCOPY (EGD);  Surgeon: [name], MD;  Location: [facility] ENDOSCOPY;  Service: Endoscopy;  Laterality: N/A;   ESOPHAGOGASTRODUODENOSCOPY (EGD) WITH PROPOFOL N/A 10/04/2019   Procedure: ESOPHAGOGASTRODUODENOSCOPY (EGD) WITH PROPOFOL;  Surgeon: Lucilla Lame, MD;  Location: [facility] ENDOSCOPY;  Service: Endoscopy;  Laterality: N/A;   HERNIA REPAIR  2015   left groin   INTERCOSTAL NERVE BLOCK Left 11/09/2019   Procedure: Intercostal Nerve Block;  Surgeon: Melrose Nakayama, MD;  Location: [place];  Service: Thoracic;  Laterality: Left;   LUMBAR FUSION     RADIOACTIVE SEED IMPLANT N/A 04/22/2016   Procedure: RADIOACTIVE SEED IMPLANT/BRACHYTHERAPY IMPLANT;  Surgeon: Hollice Espy, MD;  Location: [facility] ORS;  Service: Urology;  Laterality: N/A;   ROBOT ASSISTED INGUINAL HERNIA REPAIR Bilateral 07/06/2018   Procedure: ROBOT ASSISTED INGUINAL HERNIA REPAIR;  Surgeon: Jules Husbands, MD;  Location: [facility] ORS;  Service: General;  Laterality: Bilateral;   TONSILLECTOMY     UMBILICAL HERNIA REPAIR N/A 07/06/2018   Procedure: LAPAROSCOPIC ROBOT ASSISTED UMBILICAL HERNIA;  Surgeon: Jules Husbands, MD;  Location: [facility] ORS;  Service: General;  Laterality: N/A;   XI ROBOTIC ASSISTED THORACOSCOPY- SEGMENTECTOMY Left 11/09/2019   Procedure: XI ROBOTIC ASSISTED THORACOSCOPY-WEDGE RESECTION LEFT LOWER LOBE;  Surgeon: Melrose Nakayama, MD;  Location: [place];   Service: Thoracic;  Laterality: Left;    Home Medications:  Allergies as of 06/25/2021   No Known Allergies      Medication List        Accurate as of June 25, 2021  8:58 AM. If you have any questions, ask your nurse or doctor.          STOP taking these medications    Myrbetriq 50 MG Tb24  tablet Generic drug: mirabegron ER Stopped by: Hollice Espy, MD       TAKE these medications    albuterol 108 (90 Base) MCG/ACT inhaler Commonly known as: VENTOLIN HFA Inhale 1-2 puffs into the lungs every 6 (six) hours as needed for wheezing or shortness of breath.   albuterol (2.5 MG/3ML) 0.083% nebulizer solution Commonly known as: PROVENTIL TAKE 3 MLS BY NEBULIZATION EVERY 4 (FOUR) HOURS AS NEEDED FOR WHEEZING OR SHORTNESS OF BREATH.   ALPRAZolam 1 MG tablet Commonly known as: XANAX Take 1 tablet (1 mg total) by mouth as directed. 1/2 in am 1 pill qhs   ARIPiprazole 10 MG tablet Commonly known as: ABILIFY Take 10 mg by mouth daily.   aspirin EC 81 MG tablet Take 81 mg by mouth daily.   Breztri Aerosphere 160-9-4.8 MCG/ACT Aero Generic drug: Budeson-Glycopyrrol-Formoterol Inhale 2 puffs into the lungs in the morning and at bedtime.   celecoxib 200 MG capsule Commonly known as: CELEBREX TAKE 1 CAPSULE (200 MG TOTAL) BY MOUTH DAILY AFTER BREAKFAST.   clopidogrel 75 MG tablet Commonly known as: PLAVIX TAKE 1 TABLET (75 MG TOTAL) BY MOUTH DAILY.   donepezil 5 MG tablet Commonly known as: Aricept Take 1 tablet (5 mg total) by mouth at bedtime. Further refills per [facility] neurology   FLUoxetine 20 MG capsule Commonly known as: PROZAC Take 60 mg by mouth daily.   Gemtesa 75 MG Tabs Generic drug: Vibegron Take 75 mg by mouth daily. Started by: Hollice Espy, MD   hydrOXYzine 25 MG tablet Commonly known as: ATARAX Take 25 mg by mouth daily as needed.   memantine 5 MG tablet Commonly known as: NAMENDA Take 1 tablet (5 mg total) by mouth 2 (two) times daily.  Taking qd   metoprolol succinate 50 MG 24 hr tablet Commonly known as: TOPROL-XL Take 1 tablet (50 mg total) by mouth daily.   multivitamin with minerals tablet Take 1 tablet by mouth daily.   NEOMYCIN-POLYMYXIN-HYDROCORTISONE 1 % Soln OTIC solution Commonly known as: CORTISPORIN Apply to nail beds from procedure site twice daily after soaks   nitroGLYCERIN 0.4 MG SL tablet Commonly known as: NITROSTAT Place 1 tablet (0.4 mg total) under the tongue every 5 (five) minutes as needed for chest pain. Maximum of 3 doses   ondansetron 4 MG tablet Commonly known as: Zofran Take 1 tablet (4 mg total) by mouth every 8 (eight) hours as needed.   pantoprazole 40 MG tablet Commonly known as: PROTONIX Take 1 tablet (40 mg total) by mouth daily. 30 min before breakfast   rosuvastatin 10 MG tablet Commonly known as: CRESTOR Take 1 tablet (10 mg total) by mouth at bedtime.   sucralfate 1 g tablet Commonly known as: CARAFATE TAKE 1 TABLET FOUR TIMES A DAY   Symbicort 160-4.5 MCG/ACT inhaler Generic drug: budesonide-formoterol INHALE 2 PUFFS INTO THE LUNGS 2 (TWO) TIMES DAILY. RINSE MOUTH OUT   tamsulosin 0.4 MG Caps capsule Commonly known as: FLOMAX TAKE 1 CAPSULE (0.4 MG TOTAL) BY MOUTH DAILY AFTER SUPPER.   traZODone 150 MG tablet Commonly known as: DESYREL Take 1 tablet (150 mg total) by mouth at bedtime.        Allergies: No Known Allergies  Family History: Family History  Problem Relation Age of Onset   Heart disease Father        Unclear details. Father passed in late 26's 2/2 cancer.   Colon cancer Father    Alcohol abuse Sister    Drug abuse Sister  Anxiety disorder Sister    Depression Sister    COPD Brother    Obesity Brother    Kidney disease Neg Hx    Prostate cancer Neg Hx     Social History:  reports that he has been smoking cigarettes. He started smoking about 58 years ago. He has a 3.00 pack-year smoking history. He has never used smokeless tobacco.  He reports current alcohol use of about 1.0 standard drink per week. He reports current drug use. Frequency: 7.00 times per week. Drug: Marijuana.   Physical Exam: BP 135/64   Pulse 77   Ht 6' (1.829 m)   Wt 250 lb (113.4 kg)   BMI 33.91 kg/m   Constitutional:  Alert and oriented, No acute distress. HEENT: [place] AT, moist mucus membranes.  Trachea midline, no masses. Cardiovascular: No clubbing, cyanosis, or edema. Respiratory: Normal respiratory effort, no increased work of breathing. Skin: No rashes, bruises or suspicious lesions. Neurologic: Grossly intact, no focal deficits, moving all 4 extremities. Psychiatric: Normal mood and affect.  Laboratory Data:  Lab Results  Component Value Date   CREATININE 1.22 01/21/2021    Lab Results  Component Value Date   HGBA1C 5.6 05/21/2020    Urinalysis 11-30 RBCs, 6-10 WBCs  Pertinent Imaging: CLINICAL DATA:  Bosniak category IIF lesion of the right kidney lower pole anteriorly, surveillance assessment.   EXAM: MRI ABDOMEN WITHOUT AND WITH CONTRAST   TECHNIQUE: Multiplanar multisequence MR imaging of the abdomen was performed both before and after the administration of intravenous contrast.   CONTRAST:  85mL GADAVIST GADOBUTROL 1 MMOL/ML IV SOLN   COMPARISON:  Multiple exams, including 08/30/2019   FINDINGS: Lower chest: Scarring at the left lung base with trace left pleural effusion.   Hepatobiliary: Multiple small hepatic cysts. Gallbladder unremarkable. No biliary dilatation.   Pancreas:  Unremarkable   Spleen: 1.2 cm in diameter lesion in the upper margin of the spleen has ring enhancement on arterial phase images and diffuse delayed enhancement on later images, unchanged in size, appearance favors hemangioma.   Adrenals/Urinary Tract: Both adrenal glands appear normal. Bilateral Bosniak category 1 and category 2 cysts.   Lesion anteriorly along the inferior aspect of the right kidney lower pole has 2  internal air-fluid levels, with dependent portions showing progressively lower T2 signal and higher T1 signal, but without observed enhancement. No internal nodularity.   The lesion of concern on prior MRI appears to represent a confluence of several smaller cysts. The more concerning lateral portion previously currently demonstrates only minimal complexity with linear low T2 signal on image 21 series 4 and faint accentuated T1 signal on image 44 series 12. There is no worrisome enhancement in this region and overall this lesion is considered downgraded to Bosniak category 2 (complex, but benign, and not requiring further imaging workup).   Stomach/Bowel: Sigmoid colon diverticulosis.   Vascular/Lymphatic: Aortoiliac atherosclerotic vascular disease. No pathologic adenopathy is identified.   Other:  No supplemental non-categorized findings.   Musculoskeletal: Posterolateral rod and pedicle screw fixation at L4-5. Lumbar spondylosis and degenerative disc disease with degenerative endplate findings at G8-3.   IMPRESSION: 1. The lesion of concern in the right kidney anteriorly has reduced complexity compared to previously, and is currently categorized as a Bosniak category 2 complex but benign cyst. This requires no further follow up. 2. Other Bosniak category 1 and category 2 cysts are present in the kidneys. 3. Stable 1.2 cm upper splenic lesion demonstrates delayed diffuse enhancement favoring hemangioma. 4. Sigmoid colon  diverticulosis. 5.  Aortic Atherosclerosis (ICD10-I70.0). 6. Lumbar spondylosis and degenerative disc disease. 7. Chronic scarring at the left lung base with a trace left pleural effusion.   MRI personally reviewed, agree with above   Assessment & Plan:   History of prostate cancer   - PSA still low, well controlled  - Will continue annual check of PSA   2. BPH with urinary incontinence  - worsening  - will adjust medications.  - avoid  antichologenerics due to dementia  - stop mybetriq and start Gemtesa 75 mg  -continue flomax -cysto as below  3. Microscopic hematuria  - persistent  - recommend cystoscopy to evaluate prostate anatomy  - will hold off on any upper tract imaging due to recent MRI scan.  -high risk for bladder CA due to ongoing smoking and history of XRT - urine culture to rule out infection   4. Renal cysts MRI c/w with benign cyst, no further imaging needed    Return for cystoscopy   I,Kailey Littlejohn,acting as a scribe for Hollice Espy, MD.,have documented all relevant documentation on the behalf of Hollice Espy, MD,as directed by  Hollice Espy, MD while in the presence of Hollice Espy, MD.  I have reviewed the above documentation for accuracy and completeness, and I agree with the above.   Hollice Espy, MD   [facility] [address], [place], [place] [address] [phone]

## 2021-06-25 ENCOUNTER — Encounter: Payer: Self-pay | Admitting: Oncology

## 2021-06-25 ENCOUNTER — Inpatient Hospital Stay: Payer: Medicare HMO | Admitting: Oncology

## 2021-06-25 ENCOUNTER — Inpatient Hospital Stay: Payer: Medicare HMO | Attending: Oncology

## 2021-06-25 ENCOUNTER — Other Ambulatory Visit: Payer: Self-pay

## 2021-06-25 ENCOUNTER — Encounter: Payer: Self-pay | Admitting: Urology

## 2021-06-25 ENCOUNTER — Ambulatory Visit: Payer: Medicare HMO | Admitting: Urology

## 2021-06-25 VITALS — BP 109/76 | HR 64 | Temp 98.1°F | Resp 20 | Wt 253.4 lb

## 2021-06-25 VITALS — BP 135/64 | HR 77 | Ht 72.0 in | Wt 250.0 lb

## 2021-06-25 DIAGNOSIS — Z08 Encounter for follow-up examination after completed treatment for malignant neoplasm: Secondary | ICD-10-CM | POA: Diagnosis not present

## 2021-06-25 DIAGNOSIS — C61 Malignant neoplasm of prostate: Secondary | ICD-10-CM

## 2021-06-25 DIAGNOSIS — C3490 Malignant neoplasm of unspecified part of unspecified bronchus or lung: Secondary | ICD-10-CM | POA: Diagnosis not present

## 2021-06-25 DIAGNOSIS — R3129 Other microscopic hematuria: Secondary | ICD-10-CM

## 2021-06-25 DIAGNOSIS — C3492 Malignant neoplasm of unspecified part of left bronchus or lung: Secondary | ICD-10-CM

## 2021-06-25 DIAGNOSIS — Z85118 Personal history of other malignant neoplasm of bronchus and lung: Secondary | ICD-10-CM

## 2021-06-25 LAB — CBC WITH DIFFERENTIAL/PLATELET
Abs Immature Granulocytes: 0.03 10*3/uL (ref 0.00–0.07)
Basophils Absolute: 0.1 10*3/uL (ref 0.0–0.1)
Basophils Relative: 1 %
Eosinophils Absolute: 0.2 10*3/uL (ref 0.0–0.5)
Eosinophils Relative: 2 %
HCT: 35.6 % — ABNORMAL LOW (ref 39.0–52.0)
Hemoglobin: 11.7 g/dL — ABNORMAL LOW (ref 13.0–17.0)
Immature Granulocytes: 0 %
Lymphocytes Relative: 23 %
Lymphs Abs: 2.2 10*3/uL (ref 0.7–4.0)
MCH: 33 pg (ref 26.0–34.0)
MCHC: 32.9 g/dL (ref 30.0–36.0)
MCV: 100.3 fL — ABNORMAL HIGH (ref 80.0–100.0)
Monocytes Absolute: 0.9 10*3/uL (ref 0.1–1.0)
Monocytes Relative: 9 %
Neutro Abs: 6.4 10*3/uL (ref 1.7–7.7)
Neutrophils Relative %: 65 %
Platelets: 267 10*3/uL (ref 150–400)
RBC: 3.55 MIL/uL — ABNORMAL LOW (ref 4.22–5.81)
RDW: 14.2 % (ref 11.5–15.5)
WBC: 9.9 10*3/uL (ref 4.0–10.5)
nRBC: 0 % (ref 0.0–0.2)

## 2021-06-25 LAB — URINALYSIS, COMPLETE
Bilirubin, UA: NEGATIVE
Glucose, UA: NEGATIVE
Ketones, UA: NEGATIVE
Leukocytes,UA: NEGATIVE
Nitrite, UA: NEGATIVE
Protein,UA: NEGATIVE
Specific Gravity, UA: 1.015 (ref 1.005–1.030)
Urobilinogen, Ur: 0.2 mg/dL (ref 0.2–1.0)
pH, UA: 7.5 (ref 5.0–7.5)

## 2021-06-25 LAB — COMPREHENSIVE METABOLIC PANEL
ALT: 15 U/L (ref 0–44)
AST: 19 U/L (ref 15–41)
Albumin: 3.8 g/dL (ref 3.5–5.0)
Alkaline Phosphatase: 80 U/L (ref 38–126)
Anion gap: 4 — ABNORMAL LOW (ref 5–15)
BUN: 11 mg/dL (ref 8–23)
CO2: 28 mmol/L (ref 22–32)
Calcium: 8.7 mg/dL — ABNORMAL LOW (ref 8.9–10.3)
Chloride: 106 mmol/L (ref 98–111)
Creatinine, Ser: 1.09 mg/dL (ref 0.61–1.24)
GFR, Estimated: >60 mL/min (ref >60)
Glucose, Bld: 84 mg/dL (ref 70–99)
Potassium: 4.2 mmol/L (ref 3.5–5.1)
Sodium: 138 mmol/L (ref 135–145)
Total Bilirubin: 0.3 mg/dL (ref 0.3–1.2)
Total Protein: 6.8 g/dL (ref 6.5–8.1)

## 2021-06-25 LAB — MICROSCOPIC EXAMINATION
Bacteria, UA: NONE SEEN
Epithelial Cells (non renal): NONE SEEN /hpf (ref 0–10)

## 2021-06-25 LAB — BLADDER SCAN AMB NON-IMAGING: Scan Result: 0

## 2021-06-25 MED ORDER — GEMTESA 75 MG PO TABS: 75.0000 mg | ORAL_TABLET | Freq: Every day | ORAL | 0 refills | Status: DC

## 2021-06-25 NOTE — Progress Notes (Signed)
Hematology/Oncology Consult note [facility]  [phone] [phone]  Patient Care Team: McLean-Scocuzza, [name], MD as PCP - General (Internal Medicine) Wellington Hampshire, MD as PCP - Cardiology (Cardiology) Telford Nab, RN as Oncology Nurse Navigator   Name of the patient: Christopher Orr  [id number]  1945/10/20   Date of visit: 06/25/21  Diagnosis- Stage IA lung cancer s/p surgery  Chief complaint/ Reason for visit-routine follow-up of lung cancer  Heme/Onc history:  Patient is a 75 year old male with past medical history significant for hypertension, hepatitis C, cirrhosis, COPD among other medical problems.  He is not on any baseline oxygen.  Patient underwent CT chest without contrast which showed a 1 cm left lower lobe pulmonary nodule concerning for bronchogenic carcinoma which was new as compared to his prior scan in 2006.  No thoracic adenopathy.    PET CT scan in February 2021 showed hypermetabolic left lower lobe lesion measuring 1.2 x 1.2 cm.  No evidence of intrathoracic adenopathy.  Also noted to have hypermetabolic lesion in the left parotid gland 1.7 x 2 cm.  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  Patient underwent wedge resection of the left lower lobe lung nodule in April 2021 which showed a 1.6 cm squamous cell carcinoma of.  No involvement of visceral.  Margins negative. 5 lymph nodes negative for malignancy.  Patient did not require any adjuvant chemotherapy and is currently in remission under surveillance  Interval history-patient currently reports ongoing symptoms of urinary incontinence for which she is following up with urology.  He has an upcoming cystoscopy.  Patient continues to smoke.  Reports occasional exertional shortness of breath but denies other complaints at this time.  ECOG PS- 1 Pain scale- 0  Review of systems- Review of Systems  Constitutional:  Positive for  malaise/fatigue. Negative for chills, fever and weight loss.  HENT:  Negative for congestion, ear discharge and nosebleeds.   Eyes:  Negative for blurred vision.  Respiratory:  Negative for cough, hemoptysis, sputum production, shortness of breath and wheezing.   Cardiovascular:  Negative for chest pain, palpitations, orthopnea and claudication.  Gastrointestinal:  Negative for abdominal pain, blood in stool, constipation, diarrhea, heartburn, melena, nausea and vomiting.  Genitourinary:  Negative for dysuria, flank pain, frequency, hematuria and urgency.       Urinary incontinence  Musculoskeletal:  Negative for back pain, joint pain and myalgias.  Skin:  Negative for rash.  Neurological:  Negative for dizziness, tingling, focal weakness, seizures, weakness and headaches.  Endo/Heme/Allergies:  Does not bruise/bleed easily.  Psychiatric/Behavioral:  Negative for depression and suicidal ideas. The patient does not have insomnia.       No Known Allergies   Past Medical History:  Diagnosis Date   Alcohol abuse    pt states it is marijuana   Anemia    Arthritis    Bipolar disorder (HCC)    BPH (benign prostatic hypertrophy) with urinary obstruction    CAD (coronary artery disease)    a. s/p stent to LAD in 2006 at [facility], EF 60% at that time.   Cancer [facility])    prostate   CHF (congestive heart failure) (HCC)    Chronic pancreatitis (HCC)    Cirrhosis (HCC)    COPD (chronic obstructive pulmonary disease) ([place])    COVID-19    08/22/20   Dementia ([place])    early dementia   Depression    Diverticulitis    12/06/20  [facility]    Diverticulosis    Drug use    Dyspnea    ED (erectile dysfunction)    Gastritis    GERD (gastroesophageal reflux disease)    Headache    occasional migraines   Hepatitis C    treated   History of lumbar fusion    Hyperlipidemia    Hypertension    patient denies having high blood pressure   Lung cancer ([place])    Mitral regurgitation    Pancreatitis    x  2    Pneumonia    07/06/18    PTSD (post-traumatic stress disorder)    Rib fracture    s/p fall 03/15/19    Sinus disease    Urge incontinence      Past Surgical History:  Procedure Laterality Date   CARDIAC CATHETERIZATION  2006   cateract  2013   COLONOSCOPY WITH PROPOFOL N/A 10/04/2019   Procedure: COLONOSCOPY WITH PROPOFOL;  Surgeon: Lucilla Lame, MD;  Location: [facility] ENDOSCOPY;  Service: Endoscopy;  Laterality: N/A;   CORONARY ANGIOPLASTY  2006   CORONARY STENT PLACEMENT  2006   x 1   ESOPHAGOGASTRODUODENOSCOPY N/A 12/22/2014   Procedure: ESOPHAGOGASTRODUODENOSCOPY (EGD);  Surgeon: [name], MD;  Location: [facility] ENDOSCOPY;  Service: Endoscopy;  Laterality: N/A;   ESOPHAGOGASTRODUODENOSCOPY (EGD) WITH PROPOFOL N/A 10/04/2019   Procedure: ESOPHAGOGASTRODUODENOSCOPY (EGD) WITH PROPOFOL;  Surgeon: Lucilla Lame, MD;  Location: [facility] ENDOSCOPY;  Service: Endoscopy;  Laterality: N/A;   HERNIA REPAIR  2015   left groin   INTERCOSTAL NERVE BLOCK Left 11/09/2019   Procedure: Intercostal Nerve Block;  Surgeon: Melrose Nakayama, MD;  Location: [place];  Service: Thoracic;  Laterality: Left;   LUMBAR FUSION     RADIOACTIVE SEED IMPLANT N/A 04/22/2016   Procedure: RADIOACTIVE SEED IMPLANT/BRACHYTHERAPY IMPLANT;  Surgeon: Hollice Espy, MD;  Location: [facility] ORS;  Service: Urology;  Laterality: N/A;   ROBOT ASSISTED INGUINAL HERNIA REPAIR Bilateral 07/06/2018   Procedure: ROBOT ASSISTED INGUINAL HERNIA REPAIR;  Surgeon: Jules Husbands, MD;  Location: [facility] ORS;  Service: General;  Laterality: Bilateral;   TONSILLECTOMY     UMBILICAL HERNIA REPAIR N/A 07/06/2018   Procedure: LAPAROSCOPIC ROBOT ASSISTED UMBILICAL HERNIA;  Surgeon: Jules Husbands, MD;  Location: [facility] ORS;  Service: General;  Laterality: N/A;   XI ROBOTIC ASSISTED THORACOSCOPY- SEGMENTECTOMY Left 11/09/2019   Procedure: XI ROBOTIC ASSISTED THORACOSCOPY-WEDGE RESECTION LEFT LOWER LOBE;  Surgeon: Melrose Nakayama, MD;   Location: [facility] OR;  Service: Thoracic;  Laterality: Left;    Social History   Socioeconomic History   Marital status: Divorced    Spouse name: Not on file   Number of children: 4   Years of education: Not on file   Highest education level: GED or equivalent  Occupational History   Occupation: Aeronautical engineer    Comment: retired  Tobacco Use   Smoking status: Every Day    Packs/day: 1.00    Years: 3.00    Pack years: 3.00    Types: Cigarettes    Start date: 07/28/1962   Smokeless tobacco: Never   Tobacco comments:    started back in march. 1 pack a day.  Vaping Use   Vaping Use: Never used  Substance and Sexual Activity   Alcohol use: Yes    Alcohol/week: 1.0 standard drink    Types: 1 Cans of beer per week    Comment: 2-3 beers a day   Drug use: Yes    Frequency: 7.0  times per week    Types: Marijuana    Comment: Endorsed heroin 06/2014, UDS also + benzos, opiates, THC, neg for cocaine at that time.   Sexual activity: Not Currently  Other Topics Concern   Not on file  Social History Narrative   Lives at home    Has kids and grandkids   Smoker x 55 years 1 ppd as of 07/2019 he did quit x 2 years    Drinks 1 pt liq qd as of 07/2019    Social Determinants of Health   Financial Resource Strain: Low Risk    Difficulty of Paying Living Expenses: Not hard at all  Food Insecurity: No Food Insecurity   Worried About Charity fundraiser in the Last Year: Never true   Arboriculturist in the Last Year: Never true  Transportation Needs: No Transportation Needs   Lack of Transportation (Medical): No   Lack of Transportation (Non-Medical): No  Physical Activity: Not on file  Stress: No Stress Concern Present   Feeling of Stress : Not at all  Social Connections: Moderately Isolated   Frequency of Communication with Friends and Family: More than three times a week   Frequency of Social Gatherings with Friends and Family: Once a week   Attends Religious Services: Never    Marine scientist or Organizations: Yes   Attends Music therapist: More than 4 times per year   Marital Status: Divorced  Human resources officer Violence: Not At Risk   Fear of Current or Ex-Partner: No   Emotionally Abused: No   Physically Abused: No   Sexually Abused: No    Family History  Problem Relation Age of Onset   Heart disease Father        Unclear details. Father passed in late 20's 2/2 cancer.   Colon cancer Father    Alcohol abuse Sister    Drug abuse Sister    Anxiety disorder Sister    Depression Sister    COPD Brother    Obesity Brother    Kidney disease Neg Hx    Prostate cancer Neg Hx      Current Outpatient Medications:    albuterol (PROVENTIL) (2.5 MG/3ML) 0.083% nebulizer solution, TAKE 3 MLS BY NEBULIZATION EVERY 4 (FOUR) HOURS AS NEEDED FOR WHEEZING OR SHORTNESS OF BREATH., Disp: 375 mL, Rfl: 3   albuterol (VENTOLIN HFA) 108 (90 Base) MCG/ACT inhaler, Inhale 1-2 puffs into the lungs every 6 (six) hours as needed for wheezing or shortness of breath. , Disp: , Rfl:    ARIPiprazole (ABILIFY) 10 MG tablet, Take 10 mg by mouth daily., Disp: , Rfl:    aspirin EC 81 MG tablet, Take 81 mg by mouth daily., Disp: , Rfl:    Budeson-Glycopyrrol-Formoterol (BREZTRI AEROSPHERE) 160-9-4.8 MCG/ACT AERO, Inhale 2 puffs into the lungs in the morning and at bedtime., Disp: 5.9 g, Rfl: 0   celecoxib (CELEBREX) 200 MG capsule, TAKE 1 CAPSULE (200 MG TOTAL) BY MOUTH DAILY AFTER BREAKFAST., Disp: 90 capsule, Rfl: 0   clopidogrel (PLAVIX) 75 MG tablet, TAKE 1 TABLET (75 MG TOTAL) BY MOUTH DAILY., Disp: 90 tablet, Rfl: 3   donepezil (ARICEPT) 5 MG tablet, Take 1 tablet (5 mg total) by mouth at bedtime. Further refills per [facility] neurology, Disp: 90 tablet, Rfl: 3   FLUoxetine (PROZAC) 20 MG capsule, Take 60 mg by mouth daily., Disp: , Rfl: 1   hydrOXYzine (ATARAX/VISTARIL) 25 MG tablet, Take 25 mg by mouth daily as  needed., Disp: , Rfl:    memantine (NAMENDA) 5 MG tablet,  Take 1 tablet (5 mg total) by mouth 2 (two) times daily. Taking qd, Disp: 180 tablet, Rfl: 3   metoprolol succinate (TOPROL-XL) 50 MG 24 hr tablet, Take 1 tablet (50 mg total) by mouth daily., Disp: 90 tablet, Rfl: 1   Multiple Vitamins-Minerals (MULTIVITAMIN WITH MINERALS) tablet, Take 1 tablet by mouth daily., Disp: , Rfl:    pantoprazole (PROTONIX) 40 MG tablet, Take 1 tablet (40 mg total) by mouth daily. 30 min before breakfast, Disp: 90 tablet, Rfl: 3   rosuvastatin (CRESTOR) 10 MG tablet, Take 1 tablet (10 mg total) by mouth at bedtime., Disp: 90 tablet, Rfl: 3   sucralfate (CARAFATE) 1 g tablet, TAKE 1 TABLET FOUR TIMES A DAY, Disp: 120 tablet, Rfl: 2   SYMBICORT 160-4.5 MCG/ACT inhaler, INHALE 2 PUFFS INTO THE LUNGS 2 (TWO) TIMES DAILY. RINSE MOUTH OUT, Disp: 30.6 each, Rfl: 4   tamsulosin (FLOMAX) 0.4 MG CAPS capsule, TAKE 1 CAPSULE (0.4 MG TOTAL) BY MOUTH DAILY AFTER SUPPER., Disp: 90 capsule, Rfl: 3   traZODone (DESYREL) 150 MG tablet, Take 1 tablet (150 mg total) by mouth at bedtime., Disp: 90 tablet, Rfl: 1   Vibegron (GEMTESA) 75 MG TABS, Take 75 mg by mouth daily., Disp: 30 tablet, Rfl: 0   alprazolam (XANAX) 1 MG tablet, Take 1 tablet (1 mg total) by mouth as directed. 1/2 in am 1 pill qhs (Patient not taking: Reported on 06/25/2021), Disp: , Rfl:    NEOMYCIN-POLYMYXIN-HYDROCORTISONE (CORTISPORIN) 1 % SOLN OTIC solution, Apply to nail beds from procedure site twice daily after soaks (Patient not taking: Reported on 06/25/2021), Disp: 10 mL, Rfl: 0   nitroGLYCERIN (NITROSTAT) 0.4 MG SL tablet, Place 1 tablet (0.4 mg total) under the tongue every 5 (five) minutes as needed for chest pain. Maximum of 3 doses (Patient not taking: Reported on 06/25/2021), Disp: 25 tablet, Rfl: 3   ondansetron (ZOFRAN) 4 MG tablet, Take 1 tablet (4 mg total) by mouth every 8 (eight) hours as needed. (Patient not taking: Reported on 06/25/2021), Disp: 40 tablet, Rfl: 2  Physical exam:  Vitals:   06/25/21  1414  BP: 109/76  Pulse: 64  Resp: 20  Temp: 98.1 F (36.7 C)  SpO2: 93%  Weight: 253 lb 6.4 oz (114.9 kg)   Physical Exam Constitutional:      General: He is not in acute distress. Cardiovascular:     Rate and Rhythm: Normal rate and regular rhythm.     Heart sounds: Normal heart sounds.  Pulmonary:     Effort: Pulmonary effort is normal.     Breath sounds: Normal breath sounds.  Abdominal:     General: Bowel sounds are normal.     Palpations: Abdomen is soft.  Skin:    General: Skin is warm and dry.  Neurological:     Mental Status: He is alert and oriented to person, place, and time.     CMP Latest Ref Rng & Units 06/25/2021  Glucose 70 - 99 mg/dL 84  BUN 8 - 23 mg/dL 11  Creatinine 0.61 - 1.24 mg/dL 1.09  Sodium 135 - 145 mmol/L 138  Potassium 3.5 - 5.1 mmol/L 4.2  Chloride 98 - 111 mmol/L 106  CO2 22 - 32 mmol/L 28  Calcium 8.9 - 10.3 mg/dL 8.7(L)  Total Protein 6.5 - 8.1 g/dL 6.8  Total Bilirubin 0.3 - 1.2 mg/dL 0.3  Alkaline Phos 38 - 126 U/L 80  AST 15 - 41 U/L 19  ALT 0 - 44 U/L 15   CBC Latest Ref Rng & Units 06/25/2021  WBC 4.0 - 10.5 K/uL 9.9  Hemoglobin 13.0 - 17.0 g/dL 11.7(L)  Hematocrit 39.0 - 52.0 % 35.6(L)  Platelets 150 - 400 K/uL 267    No images are attached to the encounter.  CT Chest Wo Contrast  Result Date: 06/14/2021 CLINICAL DATA:  Non-small cell lung cancer status post wedge resection EXAM: CT CHEST WITHOUT CONTRAST TECHNIQUE: Multidetector CT imaging of the chest was performed following the standard protocol without IV contrast. COMPARISON:  11/26/2020 FINDINGS: Cardiovascular: Aortic atherosclerosis. Normal heart size. Three-vessel coronary artery calcifications and stents. No pericardial effusion. Mediastinum/Nodes: No enlarged mediastinal, hilar, or axillary lymph nodes. Thyroid gland, trachea, and esophagus demonstrate no significant findings. Lungs/Pleura: Moderate centrilobular and paraseptal. Diffuse bilateral bronchial wall  thickening. Unchanged postoperative appearance of the dependent left lower lobe status post wedge resection, with somewhat nodular scar tissue about inferior aspect of the wedge resection line (series 4, image 122). Subpleural rounded atelectasis about the lateral left lower lobe and lingula (series 4, image 27). No pleural effusion or pneumothorax. Upper Abdomen: No acute abnormality. Musculoskeletal: No chest wall mass or suspicious bone lesions identified. IMPRESSION: 1. Unchanged postoperative appearance of the dependent left lower lobe status post wedge resection, with somewhat nodular scar tissue about inferior aspect of the wedge resection line. Attention on follow-up. No evidence of malignant recurrence. 2. Moderate emphysema. 3. Coronary artery disease. Emphysema (ICD10-J43.9). Electronically Signed   By: Delanna Ahmadi M.D.   On: 06/14/2021 11:29   LONG TERM MONITOR (3-14 DAYS)  Result Date: 05/31/2021 Patch Wear Time:  7 days and 19 hours (2022-10-20T14:16:09-398 to 2022-10-28T09:32:13-0400) Patient had a min HR of 60 bpm, max HR of 210 bpm, and avg HR of 80 bpm. Predominant underlying rhythm was Sinus Rhythm. 42 Supraventricular Tachycardia runs occurred, the run with the fastest interval lasting 5 beats with a max rate of 210 bpm, the longest lasting 5 beats with an avg rate of 116 bpm. Frequent PACs with a burden of 8% Rare PVCs.     Assessment and plan- Patient is a 75 y.o. male with stage IA pT1b pN0 cM0 squamous cell carcinoma of the right lobe status post wedge resection.  He is here for routine follow-up of lung cancer  I have reviewed recent CT chest images independently and discussed findings with the patient which shows postoperative changes in the left lower lobe s/p wedge resection.  Nodular scar tissue in the inferior aspect of the wedge resection but no overt concerning findings for recurrence.  My plan is to repeat CT chest without contrast in 6 months and I will see him  thereafter.  I have encouraged the patient to quit smoking since would not be associated with increased risk of lung cancer recurrence as well as bladder cancer   Visit Diagnosis 1. Encounter for follow-up surveillance of lung cancer      Dr. Randa Evens, MD, MPH [facility] [phone] 06/25/2021 4:05 PM

## 2021-06-25 NOTE — Patient Instructions (Signed)

## 2021-06-25 NOTE — Progress Notes (Signed)
Pt states that he has been having to change clothes at least 2x a day due to incontinence being followed by Dr. Hollice Espy. Pt states this morning he had a fall when waking up due to dizziness and not being able to hold his balance.

## 2021-06-28 ENCOUNTER — Ambulatory Visit (INDEPENDENT_AMBULATORY_CARE_PROVIDER_SITE_OTHER): Payer: Medicare HMO | Admitting: Internal Medicine

## 2021-06-28 ENCOUNTER — Encounter: Payer: Self-pay | Admitting: Internal Medicine

## 2021-06-28 ENCOUNTER — Other Ambulatory Visit: Payer: Self-pay

## 2021-06-28 ENCOUNTER — Telehealth: Payer: Self-pay | Admitting: Internal Medicine

## 2021-06-28 VITALS — BP 110/70 | HR 73 | Temp 98.7°F | Ht 72.0 in | Wt 254.2 lb

## 2021-06-28 DIAGNOSIS — J449 Chronic obstructive pulmonary disease, unspecified: Secondary | ICD-10-CM

## 2021-06-28 DIAGNOSIS — Z13220 Encounter for screening for lipoid disorders: Secondary | ICD-10-CM | POA: Diagnosis not present

## 2021-06-28 DIAGNOSIS — Z0000 Encounter for general adult medical examination without abnormal findings: Secondary | ICD-10-CM | POA: Diagnosis not present

## 2021-06-28 DIAGNOSIS — J439 Emphysema, unspecified: Secondary | ICD-10-CM | POA: Diagnosis not present

## 2021-06-28 DIAGNOSIS — D7589 Other specified diseases of blood and blood-forming organs: Secondary | ICD-10-CM

## 2021-06-28 DIAGNOSIS — M159 Polyosteoarthritis, unspecified: Secondary | ICD-10-CM

## 2021-06-28 DIAGNOSIS — I2510 Atherosclerotic heart disease of native coronary artery without angina pectoris: Secondary | ICD-10-CM | POA: Diagnosis not present

## 2021-06-28 DIAGNOSIS — E785 Hyperlipidemia, unspecified: Secondary | ICD-10-CM

## 2021-06-28 DIAGNOSIS — R739 Hyperglycemia, unspecified: Secondary | ICD-10-CM

## 2021-06-28 DIAGNOSIS — R5383 Other fatigue: Secondary | ICD-10-CM

## 2021-06-28 DIAGNOSIS — K219 Gastro-esophageal reflux disease without esophagitis: Secondary | ICD-10-CM

## 2021-06-28 DIAGNOSIS — Z1329 Encounter for screening for other suspected endocrine disorder: Secondary | ICD-10-CM | POA: Diagnosis not present

## 2021-06-28 DIAGNOSIS — D649 Anemia, unspecified: Secondary | ICD-10-CM | POA: Diagnosis not present

## 2021-06-28 MED ORDER — CLOPIDOGREL BISULFATE 75 MG PO TABS: 75.0000 mg | ORAL_TABLET | Freq: Every day | ORAL | 3 refills | Status: DC

## 2021-06-28 MED ORDER — ALBUTEROL SULFATE HFA 108 (90 BASE) MCG/ACT IN AERS: 1.0000 | INHALATION_SPRAY | Freq: Four times a day (QID) | RESPIRATORY_TRACT | 12 refills | Status: DC | PRN

## 2021-06-28 MED ORDER — ROSUVASTATIN CALCIUM 10 MG PO TABS: 10.0000 mg | ORAL_TABLET | Freq: Every day | ORAL | 3 refills | Status: DC

## 2021-06-28 MED ORDER — CELECOXIB 200 MG PO CAPS: 200.0000 mg | ORAL_CAPSULE | Freq: Every day | ORAL | 1 refills | Status: DC

## 2021-06-28 MED ORDER — PANTOPRAZOLE SODIUM 40 MG PO TBEC: 40.0000 mg | DELAYED_RELEASE_TABLET | Freq: Every day | ORAL | 3 refills | Status: DC

## 2021-06-28 MED ORDER — BUDESONIDE-FORMOTEROL FUMARATE 160-4.5 MCG/ACT IN AERO: INHALATION_SPRAY | RESPIRATORY_TRACT | 4 refills | Status: DC

## 2021-06-28 NOTE — Progress Notes (Signed)
Chief Complaint  Patient presents with   Annual Exam   Annual  1. Copd still smoking cut back on prn albuterol and using symbicort stable  2. Psych seeing someone in [facility] phone call today will get name of psychiatrist  3. Lung cancer ct chest due 11/2021 rec smoking cessation 4. C/o bladder leakage, no pressure weak stream, nocuturia 2-3 x per at night only been on gemtesa 75 2-3 doses please address again 07/11/21  Stop myrbetriq continue flomax per urology cystoscopy for blood in urine sch 07/11/21    Review of Systems  Constitutional:  Negative for weight loss.  HENT:  Negative for hearing loss.   Eyes:  Negative for blurred vision.  Respiratory:  Negative for shortness of breath.   Cardiovascular:  Negative for chest pain.  Gastrointestinal:  Negative for abdominal pain and blood in stool.  Genitourinary:  Positive for frequency.       BPH sxs  Musculoskeletal:  Negative for back pain.  Skin:  Negative for rash.  Neurological:  Negative for headaches.  Psychiatric/Behavioral:  Negative for depression.   Past Medical History:  Diagnosis Date   Alcohol abuse    pt states it is marijuana   Anemia    Arthritis    Bipolar disorder (HCC)    BPH (benign prostatic hypertrophy) with urinary obstruction    CAD (coronary artery disease)    a. s/p stent to LAD in 2006 at [facility], EF 60% at that time.   Cancer [facility])    prostate   CHF (congestive heart failure) (HCC)    Chronic pancreatitis (HCC)    Cirrhosis (HCC)    COPD (chronic obstructive pulmonary disease) ([place])    COVID-19    08/22/20   Dementia ([place])    early dementia   Depression    Diverticulitis    12/06/20 [facility]    Diverticulosis    Drug use    Dyspnea    ED (erectile dysfunction)    Gastritis    GERD (gastroesophageal reflux disease)    Headache    occasional migraines   Hepatitis C    treated   History of lumbar fusion    Hyperlipidemia    Hypertension    patient denies having high blood pressure   Lung  cancer ([place])    Mitral regurgitation    Pancreatitis    x 2    Pneumonia    07/06/18    Prostate cancer ([place])    with seeds   PTSD (post-traumatic stress disorder)    Rib fracture    s/p fall 03/15/19    Sinus disease    Urge incontinence    Past Surgical History:  Procedure Laterality Date   CARDIAC CATHETERIZATION  2006   cateract  2013   COLONOSCOPY WITH PROPOFOL N/A 10/04/2019   Procedure: COLONOSCOPY WITH PROPOFOL;  Surgeon: Lucilla Lame, MD;  Location: [facility] ENDOSCOPY;  Service: Endoscopy;  Laterality: N/A;   CORONARY ANGIOPLASTY  2006   CORONARY STENT PLACEMENT  2006   x 1   ESOPHAGOGASTRODUODENOSCOPY N/A 12/22/2014   Procedure: ESOPHAGOGASTRODUODENOSCOPY (EGD);  Surgeon: [name], MD;  Location: [facility] ENDOSCOPY;  Service: Endoscopy;  Laterality: N/A;   ESOPHAGOGASTRODUODENOSCOPY (EGD) WITH PROPOFOL N/A 10/04/2019   Procedure: ESOPHAGOGASTRODUODENOSCOPY (EGD) WITH PROPOFOL;  Surgeon: Lucilla Lame, MD;  Location: [facility] ENDOSCOPY;  Service: Endoscopy;  Laterality: N/A;   HERNIA REPAIR  2015   left groin   INTERCOSTAL NERVE BLOCK Left 11/09/2019   Procedure: Intercostal Nerve Block;  Surgeon: Melrose Nakayama, MD;  Location: [place];  Service: Thoracic;  Laterality: Left;   LUMBAR FUSION     RADIOACTIVE SEED IMPLANT N/A 04/22/2016   Procedure: RADIOACTIVE SEED IMPLANT/BRACHYTHERAPY IMPLANT;  Surgeon: Hollice Espy, MD;  Location: [facility] ORS;  Service: Urology;  Laterality: N/A;   ROBOT ASSISTED INGUINAL HERNIA REPAIR Bilateral 07/06/2018   Procedure: ROBOT ASSISTED INGUINAL HERNIA REPAIR;  Surgeon: Jules Husbands, MD;  Location: [facility] ORS;  Service: General;  Laterality: Bilateral;   TONSILLECTOMY     UMBILICAL HERNIA REPAIR N/A 07/06/2018   Procedure: LAPAROSCOPIC ROBOT ASSISTED UMBILICAL HERNIA;  Surgeon: Jules Husbands, MD;  Location: [facility] ORS;  Service: General;  Laterality: N/A;   XI ROBOTIC ASSISTED THORACOSCOPY- SEGMENTECTOMY Left 11/09/2019   Procedure: XI ROBOTIC  ASSISTED THORACOSCOPY-WEDGE RESECTION LEFT LOWER LOBE;  Surgeon: Melrose Nakayama, MD;  Location: [facility] OR;  Service: Thoracic;  Laterality: Left;   Family History  Problem Relation Age of Onset   Heart disease Father        Unclear details. Father passed in late 31's 2/2 cancer.   Colon cancer Father    Alcohol abuse Sister    Drug abuse Sister    Anxiety disorder Sister    Depression Sister    COPD Brother    Obesity Brother    Kidney disease Neg Hx    Prostate cancer Neg Hx    Social History   Socioeconomic History   Marital status: Divorced    Spouse name: Not on file   Number of children: 4   Years of education: Not on file   Highest education level: GED or equivalent  Occupational History   Occupation: Aeronautical engineer    Comment: retired  Tobacco Use   Smoking status: Every Day    Packs/day: 1.00    Years: 3.00    Pack years: 3.00    Types: Cigarettes    Start date: 07/28/1962   Smokeless tobacco: Never   Tobacco comments:    started back in march. 1 pack a day.  Vaping Use   Vaping Use: Never used  Substance and Sexual Activity   Alcohol use: Yes    Alcohol/week: 1.0 standard drink    Types: 1 Cans of beer per week    Comment: 2-3 beers a day   Drug use: Yes    Frequency: 7.0 times per week    Types: Marijuana    Comment: Endorsed heroin 06/2014, UDS also + benzos, opiates, THC, neg for cocaine at that time.   Sexual activity: Not Currently  Other Topics Concern   Not on file  Social History Narrative   Lives at home    Has kids and grandkids   Smoker x 55 years 1 ppd as of 07/2019 he did quit x 2 years    Drinks 1 pt liq qd as of 07/2019    Social Determinants of Health   Financial Resource Strain: Low Risk    Difficulty of Paying Living Expenses: Not hard at all  Food Insecurity: No Food Insecurity   Worried About Charity fundraiser in the Last Year: Never true   Arboriculturist in the Last Year: Never true  Transportation Needs: No  Transportation Needs   Lack of Transportation (Medical): No   Lack of Transportation (Non-Medical): No  Physical Activity: Not on file  Stress: No Stress Concern Present   Feeling of Stress : Not at all  Social Connections: Moderately Isolated  Frequency of Communication with Friends and Family: More than three times a week   Frequency of Social Gatherings with Friends and Family: Once a week   Attends Religious Services: Never   Marine scientist or Organizations: Yes   Attends Music therapist: More than 4 times per year   Marital Status: Divorced  Human resources officer Violence: Not At Risk   Fear of Current or Ex-Partner: No   Emotionally Abused: No   Physically Abused: No   Sexually Abused: No   Current Meds  Medication Sig   albuterol (PROVENTIL) (2.5 MG/3ML) 0.083% nebulizer solution TAKE 3 MLS BY NEBULIZATION EVERY 4 (FOUR) HOURS AS NEEDED FOR WHEEZING OR SHORTNESS OF BREATH.   alprazolam (XANAX) 1 MG tablet Take 1 tablet (1 mg total) by mouth as directed. 1/2 in am 1 pill qhs   ARIPiprazole (ABILIFY) 10 MG tablet Take 10 mg by mouth daily.   aspirin EC 81 MG tablet Take 81 mg by mouth daily.   Budeson-Glycopyrrol-Formoterol (BREZTRI AEROSPHERE) 160-9-4.8 MCG/ACT AERO Inhale 2 puffs into the lungs in the morning and at bedtime.   donepezil (ARICEPT) 5 MG tablet Take 1 tablet (5 mg total) by mouth at bedtime. Further refills per [facility] neurology   FLUoxetine (PROZAC) 20 MG capsule Take 60 mg by mouth daily.   hydrOXYzine (ATARAX/VISTARIL) 25 MG tablet Take 25 mg by mouth daily as needed.   memantine (NAMENDA) 5 MG tablet Take 1 tablet (5 mg total) by mouth 2 (two) times daily. Taking qd   metoprolol succinate (TOPROL-XL) 50 MG 24 hr tablet Take 1 tablet (50 mg total) by mouth daily.   Multiple Vitamins-Minerals (MULTIVITAMIN WITH MINERALS) tablet Take 1 tablet by mouth daily.   NEOMYCIN-POLYMYXIN-HYDROCORTISONE (CORTISPORIN) 1 % SOLN OTIC solution Apply to nail beds  from procedure site twice daily after soaks   nitroGLYCERIN (NITROSTAT) 0.4 MG SL tablet Place 1 tablet (0.4 mg total) under the tongue every 5 (five) minutes as needed for chest pain. Maximum of 3 doses   ondansetron (ZOFRAN) 4 MG tablet Take 1 tablet (4 mg total) by mouth every 8 (eight) hours as needed.   sucralfate (CARAFATE) 1 g tablet TAKE 1 TABLET FOUR TIMES A DAY   tamsulosin (FLOMAX) 0.4 MG CAPS capsule TAKE 1 CAPSULE (0.4 MG TOTAL) BY MOUTH DAILY AFTER SUPPER.   traZODone (DESYREL) 150 MG tablet Take 1 tablet (150 mg total) by mouth at bedtime.   Vibegron (GEMTESA) 75 MG TABS Take 75 mg by mouth daily.   [DISCONTINUED] albuterol (VENTOLIN HFA) 108 (90 Base) MCG/ACT inhaler Inhale 1-2 puffs into the lungs every 6 (six) hours as needed for wheezing or shortness of breath.    [DISCONTINUED] celecoxib (CELEBREX) 200 MG capsule TAKE 1 CAPSULE (200 MG TOTAL) BY MOUTH DAILY AFTER BREAKFAST.   [DISCONTINUED] clopidogrel (PLAVIX) 75 MG tablet TAKE 1 TABLET (75 MG TOTAL) BY MOUTH DAILY.   [DISCONTINUED] pantoprazole (PROTONIX) 40 MG tablet Take 1 tablet (40 mg total) by mouth daily. 30 min before breakfast   [DISCONTINUED] rosuvastatin (CRESTOR) 10 MG tablet Take 1 tablet (10 mg total) by mouth at bedtime.   [DISCONTINUED] SYMBICORT 160-4.5 MCG/ACT inhaler INHALE 2 PUFFS INTO THE LUNGS 2 (TWO) TIMES DAILY. RINSE MOUTH OUT   No Known Allergies Recent Results (from the past 2160 hour(s))  PSA     Status: None   Collection Time: 05/23/21 11:13 AM  Result Value Ref Range   Prostatic Specific Antigen 0.13 0.00 - 4.00 ng/mL  Comment: (NOTE) While PSA levels of <=4.0 ng/ml are reported as reference range, some men with levels below 4.0 ng/ml can have prostate cancer and many men with PSA above 4.0 ng/ml do not have prostate cancer.  Other tests such as free PSA, age specific reference ranges, PSA velocity and PSA doubling time may be helpful especially in men less than 49 years old. Performed  at [facility], [place] [address] [place], [place] [address]   Urinalysis, Complete     Status: Abnormal   Collection Time: 06/25/21  8:29 AM  Result Value Ref Range   Specific Gravity, UA 1.015 1.005 - 1.030   pH, UA 7.5 5.0 - 7.5   Color, UA Yellow Yellow   Appearance Ur Clear Clear   Leukocytes,UA Negative Negative   Protein,UA Negative Negative/Trace   Glucose, UA Negative Negative   Ketones, UA Negative Negative   RBC, UA Trace (A) Negative   Bilirubin, UA Negative Negative   Urobilinogen, Ur 0.2 0.2 - 1.0 mg/dL   Nitrite, UA Negative Negative   Microscopic Examination See below:   Microscopic Examination     Status: Abnormal   Collection Time: 06/25/21  8:29 AM   Urine  Result Value Ref Range   WBC, UA 6-10 (A) 0 - 5 /hpf   RBC 11-30 (A) 0 - 2 /hpf   Epithelial Cells (non renal) None seen 0 - 10 /hpf   Bacteria, UA None seen None seen/Few  BLADDER SCAN AMB NON-IMAGING     Status: None   Collection Time: 06/25/21  8:50 AM  Result Value Ref Range   Scan Result 0 ml   CULTURE, URINE COMPREHENSIVE     Status: None (Preliminary result)   Collection Time: 06/25/21  8:56 AM   Specimen: Urine   UR  Result Value Ref Range   Urine Culture, Comprehensive Preliminary report    Organism ID, Bacteria Comment     Comment: Mixed urogenital flora 1,000 Colonies/mL   Comprehensive metabolic panel     Status: Abnormal   Collection Time: 06/25/21  1:50 PM  Result Value Ref Range   Sodium 138 135 - 145 mmol/L   Potassium 4.2 3.5 - 5.1 mmol/L   Chloride 106 98 - 111 mmol/L   CO2 28 22 - 32 mmol/L   Glucose, Bld 84 70 - 99 mg/dL    Comment: Glucose reference range applies only to samples taken after fasting for at least 8 hours.   BUN 11 8 - 23 mg/dL   Creatinine, Ser 1.09 0.61 - 1.24 mg/dL   Calcium 8.7 (L) 8.9 - 10.3 mg/dL   Total Protein 6.8 6.5 - 8.1 g/dL   Albumin 3.8 3.5 - 5.0 g/dL   AST 19 15 - 41 U/L   ALT 15 0 - 44 U/L   Alkaline Phosphatase 80 38 - 126 U/L    Total Bilirubin 0.3 0.3 - 1.2 mg/dL   GFR, Estimated >60 >60 mL/min    Comment: (NOTE) Calculated using the CKD-EPI Creatinine Equation (2021)    Anion gap 4 (L) 5 - 15    Comment: Performed at [facility], [address] [place], [place] [address]  CBC with Differential     Status: Abnormal   Collection Time: 06/25/21  1:50 PM  Result Value Ref Range   WBC 9.9 4.0 - 10.5 K/uL   RBC 3.55 (L) 4.22 - 5.81 MIL/uL   Hemoglobin 11.7 (L) 13.0 - 17.0 g/dL   HCT 35.6 (L)  39.0 - 52.0 %   MCV 100.3 (H) 80.0 - 100.0 fL   MCH 33.0 26.0 - 34.0 pg   MCHC 32.9 30.0 - 36.0 g/dL   RDW 14.2 11.5 - 15.5 %   Platelets 267 150 - 400 K/uL   nRBC 0.0 0.0 - 0.2 %   Neutrophils Relative % 65 %   Neutro Abs 6.4 1.7 - 7.7 K/uL   Lymphocytes Relative 23 %   Lymphs Abs 2.2 0.7 - 4.0 K/uL   Monocytes Relative 9 %   Monocytes Absolute 0.9 0.1 - 1.0 K/uL   Eosinophils Relative 2 %   Eosinophils Absolute 0.2 0.0 - 0.5 K/uL   Basophils Relative 1 %   Basophils Absolute 0.1 0.0 - 0.1 K/uL   Immature Granulocytes 0 %   Abs Immature Granulocytes 0.03 0.00 - 0.07 K/uL    Comment: Performed at [facility], [address] [place], [place] [address]   Objective  Body mass index is 34.48 kg/m. Wt Readings from Last 3 Encounters:  06/28/21 254 lb 3.2 oz (115.3 kg)  06/25/21 253 lb 6.4 oz (114.9 kg)  06/25/21 250 lb (113.4 kg)   Temp Readings from Last 3 Encounters:  06/28/21 98.7 F (37.1 C) (Oral)  06/25/21 98.1 F (36.7 C)  05/31/21 (!) 97.2 F (36.2 C) (Tympanic)   BP Readings from Last 3 Encounters:  06/28/21 110/70  06/25/21 109/76  06/25/21 135/64   Pulse Readings from Last 3 Encounters:  06/28/21 73  06/25/21 64  06/25/21 77    Physical Exam Vitals and nursing note reviewed.  Constitutional:      Appearance: Normal appearance. He is well-developed and well-groomed. He is obese.  HENT:     Head: Normocephalic and atraumatic.  Eyes:     Conjunctiva/sclera: Conjunctivae  normal.     Pupils: Pupils are equal, round, and reactive to light.  Cardiovascular:     Rate and Rhythm: Normal rate and regular rhythm.     Heart sounds: Normal heart sounds.  Pulmonary:     Effort: Pulmonary effort is normal. No respiratory distress.     Breath sounds: Normal breath sounds.  Abdominal:     Tenderness: There is no abdominal tenderness.  Musculoskeletal:     Lumbar back: Tenderness present. Negative right straight leg raise test and negative left straight leg raise test.  Skin:    General: Skin is warm and moist.  Neurological:     General: No focal deficit present.     Mental Status: He is alert and oriented to person, place, and time. Mental status is at baseline.     Sensory: Sensation is intact.     Motor: Motor function is intact.     Coordination: Coordination is intact.     Gait: Gait is intact. Gait normal.  Psychiatric:        Attention and Perception: Attention and perception normal.        Mood and Affect: Mood and affect normal.        Speech: Speech normal.        Behavior: Behavior normal. Behavior is cooperative.        Thought Content: Thought content normal.        Cognition and Memory: Cognition and memory normal.        Judgment: Judgment normal.    Assessment  Plan  Annual physical exam Call [facility] for vaccines as of 06/28/21  Flu shot utd  prevnar utd --> pna 23 due in  1 year 04/14/20 ? If had Tdap consider future rx today 12/27/20 shingrix consider future  covid vx per pt had 3/3 pfizer rec 4th pt declines 06/28/21   COPD + Smoker 1 ppd x 55 years as of 07/2019 and had quit x 2 years in the past  CT chest sch 05/17/20 s/p LLL resection 11/09/19 and CT chest repeat 11/16/20    H/o hep C tx'ed [facility] last 11/13/15 not detected quant will recheck with hep A immune 09/21/14/hep B >1000 09/21/14 and sAg negative  Alcohol abuse drinks 1 pt liquor qd as of 11/01/20 cute back    H/o prostate cancer s/p brachytx with seeds PSA 0.1 12/21/20 f/u  Dr.Brandon urology 10/2019 and Dr. Massie Maroon rad/onc PSA 05/23/20 <0.01  F/u urology 5/22    Skin-normal exam except for bruising and reason for visit  -seen [facility] 2021or Dr. Phillip Heal    EGD/colonoscopy 10/04/19 Colonoscopy 10/04/19 tubular and hyperplastic Dr. Allen Norris f/u in 5 years   Rec smoking cessation and etoh per pt quit  As of 06/28/21 consider [facility] f/u    Neurology Dr. Manuella Ghazi Cards Dr. Fletcher Anon  [facility] psych  pulm Dr. Patsey Berthold   Macrocytosis - Plan: Folate per pt stopped drinking  Pulmonary emphysema, unspecified emphysema type ([place]) - Plan: albuterol (VENTOLIN HFA) 108 (90 Base) MCG/ACT inhaler Rec smoking cessation   Osteoarthritis of multiple joints, unspecified osteoarthritis type - Plan: celecoxib (CELEBREX) 200 MG capsule  Coronary artery disease involving native coronary artery of native heart without angina pectoris - Plan: clopidogrel (PLAVIX) 75 MG tablet  Gastroesophageal reflux disease, unspecified whether esophagitis present - Plan: pantoprazole (PROTONIX) 40 MG tablet  Hyperlipidemia, unspecified hyperlipidemia type - Plan: rosuvastatin (CRESTOR) 10 MG tablet     Provider: Dr. Olivia Mackie McLean-Scocuzza-Internal Medicine

## 2021-06-28 NOTE — Patient Instructions (Addendum)
BPH with urinary incontinence  - worsening  - stop mybetriq and start Gemtesa 75 mg  -continue flomax -cystoscopy 07/11/21   Consider multivitamin with iron centrum or nature made (over the counter)

## 2021-06-29 LAB — CULTURE, URINE COMPREHENSIVE

## 2021-06-30 ENCOUNTER — Emergency Department
Admission: EM | Admit: 2021-06-30 | Discharge: 2021-06-30 | Disposition: A | Discharge status: Home / Self Care | Payer: Medicare HMO | Attending: Emergency Medicine | Admitting: Emergency Medicine

## 2021-06-30 ENCOUNTER — Encounter: Payer: Self-pay | Admitting: Emergency Medicine

## 2021-06-30 ENCOUNTER — Other Ambulatory Visit: Payer: Self-pay

## 2021-06-30 DIAGNOSIS — F1094 Alcohol use, unspecified with alcohol-induced mood disorder: Secondary | ICD-10-CM | POA: Diagnosis not present

## 2021-06-30 DIAGNOSIS — R45851 Suicidal ideations: Secondary | ICD-10-CM | POA: Insufficient documentation

## 2021-06-30 DIAGNOSIS — Z79899 Other long term (current) drug therapy: Secondary | ICD-10-CM | POA: Insufficient documentation

## 2021-06-30 DIAGNOSIS — I2510 Atherosclerotic heart disease of native coronary artery without angina pectoris: Secondary | ICD-10-CM | POA: Insufficient documentation

## 2021-06-30 DIAGNOSIS — G301 Alzheimer's disease with late onset: Secondary | ICD-10-CM | POA: Diagnosis not present

## 2021-06-30 DIAGNOSIS — Z85118 Personal history of other malignant neoplasm of bronchus and lung: Secondary | ICD-10-CM | POA: Insufficient documentation

## 2021-06-30 DIAGNOSIS — F1014 Alcohol abuse with alcohol-induced mood disorder: Secondary | ICD-10-CM | POA: Insufficient documentation

## 2021-06-30 DIAGNOSIS — Z7982 Long term (current) use of aspirin: Secondary | ICD-10-CM | POA: Diagnosis not present

## 2021-06-30 DIAGNOSIS — F17210 Nicotine dependence, cigarettes, uncomplicated: Secondary | ICD-10-CM | POA: Insufficient documentation

## 2021-06-30 DIAGNOSIS — J449 Chronic obstructive pulmonary disease, unspecified: Secondary | ICD-10-CM | POA: Diagnosis not present

## 2021-06-30 DIAGNOSIS — I10 Essential (primary) hypertension: Secondary | ICD-10-CM | POA: Diagnosis not present

## 2021-06-30 DIAGNOSIS — F0280 Dementia in other diseases classified elsewhere without behavioral disturbance: Secondary | ICD-10-CM | POA: Insufficient documentation

## 2021-06-30 DIAGNOSIS — Z8616 Personal history of COVID-19: Secondary | ICD-10-CM | POA: Insufficient documentation

## 2021-06-30 DIAGNOSIS — F1994 Other psychoactive substance use, unspecified with psychoactive substance-induced mood disorder: Secondary | ICD-10-CM

## 2021-06-30 DIAGNOSIS — Z046 Encounter for general psychiatric examination, requested by authority: Secondary | ICD-10-CM | POA: Diagnosis present

## 2021-06-30 DIAGNOSIS — Z8546 Personal history of malignant neoplasm of prostate: Secondary | ICD-10-CM | POA: Insufficient documentation

## 2021-06-30 DIAGNOSIS — F10129 Alcohol abuse with intoxication, unspecified: Secondary | ICD-10-CM | POA: Insufficient documentation

## 2021-06-30 DIAGNOSIS — F10929 Alcohol use, unspecified with intoxication, unspecified: Secondary | ICD-10-CM | POA: Diagnosis present

## 2021-06-30 LAB — COMPREHENSIVE METABOLIC PANEL
ALT: 17 U/L (ref 0–44)
AST: 22 U/L (ref 15–41)
Albumin: 4.2 g/dL (ref 3.5–5.0)
Alkaline Phosphatase: 88 U/L (ref 38–126)
Anion gap: 6 (ref 5–15)
BUN: 15 mg/dL (ref 8–23)
CO2: 25 mmol/L (ref 22–32)
Calcium: 8.8 mg/dL — ABNORMAL LOW (ref 8.9–10.3)
Chloride: 105 mmol/L (ref 98–111)
Creatinine, Ser: 0.89 mg/dL (ref 0.61–1.24)
GFR, Estimated: >60 mL/min (ref >60)
Glucose, Bld: 97 mg/dL (ref 70–99)
Potassium: 4.1 mmol/L (ref 3.5–5.1)
Sodium: 136 mmol/L (ref 135–145)
Total Bilirubin: 0.5 mg/dL (ref 0.3–1.2)
Total Protein: 7.4 g/dL (ref 6.5–8.1)

## 2021-06-30 LAB — URINE DRUG SCREEN, QUALITATIVE (ARMC ONLY)
Amphetamines, Ur Screen: NOT DETECTED
Barbiturates, Ur Screen: NOT DETECTED
Benzodiazepine, Ur Scrn: POSITIVE — AB
Cannabinoid 50 Ng, Ur ~~LOC~~: POSITIVE — AB
Cocaine Metabolite,Ur ~~LOC~~: NOT DETECTED
MDMA (Ecstasy)Ur Screen: NOT DETECTED
Methadone Scn, Ur: NOT DETECTED
Opiate, Ur Screen: NOT DETECTED
Phencyclidine (PCP) Ur S: NOT DETECTED
Tricyclic, Ur Screen: NOT DETECTED

## 2021-06-30 LAB — CBC
HCT: 37.4 % — ABNORMAL LOW (ref 39.0–52.0)
Hemoglobin: 12.7 g/dL — ABNORMAL LOW (ref 13.0–17.0)
MCH: 34 pg (ref 26.0–34.0)
MCHC: 34 g/dL (ref 30.0–36.0)
MCV: 100.3 fL — ABNORMAL HIGH (ref 80.0–100.0)
Platelets: 292 10*3/uL (ref 150–400)
RBC: 3.73 MIL/uL — ABNORMAL LOW (ref 4.22–5.81)
RDW: 14 % (ref 11.5–15.5)
WBC: 7.3 10*3/uL (ref 4.0–10.5)
nRBC: 0 % (ref 0.0–0.2)

## 2021-06-30 LAB — ETHANOL: Alcohol, Ethyl (B): 152 mg/dL — ABNORMAL HIGH (ref <10)

## 2021-06-30 LAB — ACETAMINOPHEN LEVEL: Acetaminophen (Tylenol), Serum: <10 ug/mL — ABNORMAL LOW (ref 10–30)

## 2021-06-30 LAB — SALICYLATE LEVEL: Salicylate Lvl: <7 mg/dL — ABNORMAL LOW (ref 7.0–30.0)

## 2021-06-30 MED ORDER — IBUPROFEN 600 MG PO TABS
600.0000 mg | ORAL_TABLET | Freq: Once | ORAL | Status: AC
  Administered 2021-06-30: 07:00:00 600 mg via ORAL
  Filled 2021-06-30: qty 1

## 2021-06-30 NOTE — ED Notes (Signed)
Personal Belongings:  Occupational psychologist (black) Watch (grey and yellow) White socks Black necklace Black belt [employer], $466 in [place] boxers

## 2021-06-30 NOTE — Consult Note (Signed)
[facility] Consult   Reason for Consult:  suicide statement when he was intoxicated Referring Physician:  EDP Patient Identification: Christopher Orr MRN:  [id number] Principal Diagnosis: Alcohol-induced mood disorder ([place]) Diagnosis:  Principal Problem:   Alcohol-induced mood disorder ([place]) Active Problems:   Alcohol intoxication ([place])   Total Time spent with patient: 1 hour  Subjective:   Christopher Orr is a 75 y.o. male patient admitted with alcohol intoxication and threat to end his life.  "I got drunk and called the law. I need to get my dog.  I'm ok.  I'm ready to go."  HPI:  75 yo male presents to the hospital under the influence of alcohol after calling the police with suicidal ideations.  Today, he is clear and coherent with no suicidal ideations.  One past attempt over 50 years ago when he was in [place].  Denies any intent or owning a gun.  He does have nightmares from his 20 plus years in prison.  Agreeable to go to therapy for this issue.  Denies withdrawal symptoms and states he was drinking whisky last night, a fifth, even though he had stopped as it "messes me up". And does not plan on returning to drink it.  He typically drinks 3-4 beers daily and declined detox/rehab.  No hallucinations or paranoia.  He gave permission to talk to his daughter, Christopher Orr.  Psychiatrically cleared for discharge.  Per his daughter, Christopher Orr:  "I don't think he's going to hurt himself."  Evidently, he only "gets like this when he is drinking", typically whisky. She confirms that he does not own guns.    Past Psychiatric History: nightmares, alcohol abuse, dementia  Risk to Self:  none Risk to Others:  none Prior Inpatient Therapy:  jail once Prior Outpatient Therapy:  [facility] in the past  Past Medical History:  Past Medical History:  Diagnosis Date   Alcohol abuse    pt states it is marijuana   Anemia    Arthritis    Bipolar disorder ([place])    BPH (benign prostatic  hypertrophy) with urinary obstruction    CAD (coronary artery disease)    a. s/p stent to LAD in 2006 at [facility], EF 60% at that time.   Cancer [facility])    prostate   CHF (congestive heart failure) (HCC)    Chronic pancreatitis (HCC)    Cirrhosis (HCC)    COPD (chronic obstructive pulmonary disease) ([place])    COVID-19    08/22/20   Dementia ([place])    early dementia   Depression    Diverticulitis    12/06/20 [facility]    Diverticulosis    Drug use    Dyspnea    ED (erectile dysfunction)    Gastritis    GERD (gastroesophageal reflux disease)    Headache    occasional migraines   Hepatitis C    treated   History of lumbar fusion    Hyperlipidemia    Hypertension    patient denies having high blood pressure   Lung cancer ([place])    Mitral regurgitation    Pancreatitis    x 2    Pneumonia    07/06/18    Prostate cancer ([place])    with seeds   PTSD (post-traumatic stress disorder)    Rib fracture    s/p fall 03/15/19    Sinus disease    Urge incontinence     Past Surgical History:  Procedure Laterality Date   CARDIAC CATHETERIZATION  2006   cateract  2013   COLONOSCOPY WITH PROPOFOL N/A 10/04/2019   Procedure: COLONOSCOPY WITH PROPOFOL;  Surgeon: Lucilla Lame, MD;  Location: [facility] ENDOSCOPY;  Service: Endoscopy;  Laterality: N/A;   CORONARY ANGIOPLASTY  2006   CORONARY STENT PLACEMENT  2006   x 1   ESOPHAGOGASTRODUODENOSCOPY N/A 12/22/2014   Procedure: ESOPHAGOGASTRODUODENOSCOPY (EGD);  Surgeon: [name], MD;  Location[facility] ENDOSCOPY;  Service: Endoscopy;  Laterality: N/A;   ESOPHAGOGASTRODUODENOSCOPY (EGD) WITH PROPOFOL N/A 10/04/2019   Procedure: ESOPHAGOGASTRODUODENOSCOPY (EGD) WITH PROPOFOL;  Surgeon: Lucilla Lame, MD;  Location: [facility] ENDOSCOPY;  Service: Endoscopy;  Laterality: N/A;   HERNIA REPAIR  2015   left groin   INTERCOSTAL NERVE BLOCK Left 11/09/2019   Procedure: Intercostal Nerve Block;  Surgeon: Melrose Nakayama, MD;  Location: [place];  Service: Thoracic;   Laterality: Left;   LUMBAR FUSION     RADIOACTIVE SEED IMPLANT N/A 04/22/2016   Procedure: RADIOACTIVE SEED IMPLANT/BRACHYTHERAPY IMPLANT;  Surgeon: Hollice Espy, MD;  Location: [facility] ORS;  Service: Urology;  Laterality: N/A;   ROBOT ASSISTED INGUINAL HERNIA REPAIR Bilateral 07/06/2018   Procedure: ROBOT ASSISTED INGUINAL HERNIA REPAIR;  Surgeon: Jules Husbands, MD;  Location: [facility] ORS;  Service: General;  Laterality: Bilateral;   TONSILLECTOMY     UMBILICAL HERNIA REPAIR N/A 07/06/2018   Procedure: LAPAROSCOPIC ROBOT ASSISTED UMBILICAL HERNIA;  Surgeon: Jules Husbands, MD;  Location: [facility] ORS;  Service: General;  Laterality: N/A;   XI ROBOTIC ASSISTED THORACOSCOPY- SEGMENTECTOMY Left 11/09/2019   Procedure: XI ROBOTIC ASSISTED THORACOSCOPY-WEDGE RESECTION LEFT LOWER LOBE;  Surgeon: Melrose Nakayama, MD;  Location: [facility] OR;  Service: Thoracic;  Laterality: Left;   Family History:  Family History  Problem Relation Age of Onset   Heart disease Father        Unclear details. Father passed in late 47's 2/2 cancer.   Colon cancer Father    Alcohol abuse Sister    Drug abuse Sister    Anxiety disorder Sister    Depression Sister    COPD Brother    Obesity Brother    Kidney disease Neg Hx    Prostate cancer Neg Hx    Family Psychiatric  History: see above Social History:  Social History   Substance and Sexual Activity  Alcohol Use Yes   Alcohol/week: 1.0 standard drink   Types: 1 Cans of beer per week   Comment: 2-3 beers a day     Social History   Substance and Sexual Activity  Drug Use Yes   Frequency: 7.0 times per week   Types: Marijuana   Comment: Endorsed heroin 06/2014, UDS also + benzos, opiates, THC, neg for cocaine at that time.    Social History   Socioeconomic History   Marital status: Divorced    Spouse name: Not on file   Number of children: 4   Years of education: Not on file   Highest education level: GED or equivalent  Occupational History    Occupation: Aeronautical engineer    Comment: retired  Tobacco Use   Smoking status: Every Day    Packs/day: 1.00    Years: 3.00    Pack years: 3.00    Types: Cigarettes    Start date: 07/28/1962   Smokeless tobacco: Never   Tobacco comments:    started back in march. 1 pack a day.  Vaping Use   Vaping Use: Never used  Substance and Sexual Activity   Alcohol use: Yes  Alcohol/week: 1.0 standard drink    Types: 1 Cans of beer per week    Comment: 2-3 beers a day   Drug use: Yes    Frequency: 7.0 times per week    Types: Marijuana    Comment: Endorsed heroin 06/2014, UDS also + benzos, opiates, THC, neg for cocaine at that time.   Sexual activity: Not Currently  Other Topics Concern   Not on file  Social History Narrative   Lives at home    Has kids and grandkids   Smoker x 55 years 1 ppd as of 07/2019 he did quit x 2 years    Drinks 1 pt liq qd as of 07/2019    Social Determinants of Health   Financial Resource Strain: Low Risk    Difficulty of Paying Living Expenses: Not hard at all  Food Insecurity: No Food Insecurity   Worried About Charity fundraiser in the Last Year: Never true   Arboriculturist in the Last Year: Never true  Transportation Needs: No Transportation Needs   Lack of Transportation (Medical): No   Lack of Transportation (Non-Medical): No  Physical Activity: Not on file  Stress: No Stress Concern Present   Feeling of Stress : Not at all  Social Connections: Moderately Isolated   Frequency of Communication with Friends and Family: More than three times a week   Frequency of Social Gatherings with Friends and Family: Once a week   Attends Religious Services: Never   Marine scientist or Organizations: Yes   Attends Music therapist: More than 4 times per year   Marital Status: Divorced   Additional Social History:    Allergies:  No Known Allergies  Labs:  Results for orders placed or performed during the hospital encounter of  06/30/21 (from the past 48 hour(s))  Comprehensive metabolic panel     Status: Abnormal   Collection Time: 06/30/21  4:47 AM  Result Value Ref Range   Sodium 136 135 - 145 mmol/L   Potassium 4.1 3.5 - 5.1 mmol/L   Chloride 105 98 - 111 mmol/L   CO2 25 22 - 32 mmol/L   Glucose, Bld 97 70 - 99 mg/dL    Comment: Glucose reference range applies only to samples taken after fasting for at least 8 hours.   BUN 15 8 - 23 mg/dL   Creatinine, Ser 0.89 0.61 - 1.24 mg/dL   Calcium 8.8 (L) 8.9 - 10.3 mg/dL   Total Protein 7.4 6.5 - 8.1 g/dL   Albumin 4.2 3.5 - 5.0 g/dL   AST 22 15 - 41 U/L   ALT 17 0 - 44 U/L   Alkaline Phosphatase 88 38 - 126 U/L   Total Bilirubin 0.5 0.3 - 1.2 mg/dL   GFR, Estimated >60 >60 mL/min    Comment: (NOTE) Calculated using the CKD-EPI Creatinine Equation (2021)    Anion gap 6 5 - 15    Comment: Performed at [facility], [address] [place], [place] [address]  Ethanol     Status: Abnormal   Collection Time: 06/30/21  4:47 AM  Result Value Ref Range   Alcohol, Ethyl (B) 152 (H) <10 mg/dL    Comment: (NOTE) Lowest detectable limit for serum alcohol is 10 mg/dL.  For medical purposes only. Performed at [facility], [address], [place], [place] [address]   Salicylate level     Status: Abnormal   Collection Time: 06/30/21  4:47 AM  Result Value Ref Range   Salicylate Lvl <7.8 (L) 7.0 - 30.0 mg/dL    Comment: Performed at [facility], [address] [place], [place] [address]  Acetaminophen level     Status: Abnormal   Collection Time: 06/30/21  4:47 AM  Result Value Ref Range   Acetaminophen (Tylenol), Serum <10 (L) 10 - 30 ug/mL    Comment: (NOTE) Therapeutic concentrations vary significantly. A range of 10-30 ug/mL  may be an effective concentration for many patients. However, some  are best treated at concentrations outside of this range. Acetaminophen concentrations >150 ug/mL at 4 hours after ingestion  and >50  ug/mL at 12 hours after ingestion are often associated with  toxic reactions.  Performed at [facility], [address] [place], [place] [address]   cbc     Status: Abnormal   Collection Time: 06/30/21  4:47 AM  Result Value Ref Range   WBC 7.3 4.0 - 10.5 K/uL   RBC 3.73 (L) 4.22 - 5.81 MIL/uL   Hemoglobin 12.7 (L) 13.0 - 17.0 g/dL   HCT 37.4 (L) 39.0 - 52.0 %   MCV 100.3 (H) 80.0 - 100.0 fL   MCH 34.0 26.0 - 34.0 pg   MCHC 34.0 30.0 - 36.0 g/dL   RDW 14.0 11.5 - 15.5 %   Platelets 292 150 - 400 K/uL   nRBC 0.0 0.0 - 0.2 %    Comment: Performed at [facility], [address], [place], [place] [address]  Urine Drug Screen, Qualitative     Status: Abnormal   Collection Time: 06/30/21  5:31 AM  Result Value Ref Range   Tricyclic, Ur Screen NONE DETECTED NONE DETECTED   Amphetamines, Ur Screen NONE DETECTED NONE DETECTED   MDMA (Ecstasy)Ur Screen NONE DETECTED NONE DETECTED   Cocaine Metabolite,Ur [place] NONE DETECTED NONE DETECTED   Opiate, Ur Screen NONE DETECTED NONE DETECTED   Phencyclidine (PCP) Ur S NONE DETECTED NONE DETECTED   Cannabinoid 50 Ng, Ur [place] POSITIVE (A) NONE DETECTED   Barbiturates, Ur Screen NONE DETECTED NONE DETECTED   Benzodiazepine, Ur Scrn POSITIVE (A) NONE DETECTED   Methadone Scn, Ur NONE DETECTED NONE DETECTED    Comment: (NOTE) Tricyclics + metabolites, urine    Cutoff 1000 ng/mL Amphetamines + metabolites, urine  Cutoff 1000 ng/mL MDMA (Ecstasy), urine              Cutoff 500 ng/mL Cocaine Metabolite, urine          Cutoff 300 ng/mL Opiate + metabolites, urine        Cutoff 300 ng/mL Phencyclidine (PCP), urine         Cutoff 25 ng/mL Cannabinoid, urine                 Cutoff 50 ng/mL Barbiturates + metabolites, urine  Cutoff 200 ng/mL Benzodiazepine, urine              Cutoff 200 ng/mL Methadone, urine                   Cutoff 300 ng/mL  The urine drug screen provides only a preliminary, unconfirmed analytical test result and  should not be used for non-medical purposes. Clinical consideration and professional judgment should be applied to any positive drug screen result due to possible interfering substances. A more specific alternate chemical method must be used in order to obtain a confirmed analytical result. Gas chromatography / mass spectrometry (GC/MS) is the preferred confirm atory  method. Performed at [facility], [address] [place], [place] [address]     No current facility-administered medications for this encounter.   Current Outpatient Medications  Medication Sig Dispense Refill   albuterol (VENTOLIN HFA) 108 (90 Base) MCG/ACT inhaler Inhale 1-2 puffs into the lungs every 6 (six) hours as needed for wheezing or shortness of breath. 18 g 12   alprazolam (XANAX) 1 MG tablet Take 1 tablet (1 mg total) by mouth as directed. 1/2 in am 1 pill qhs     ARIPiprazole (ABILIFY) 10 MG tablet Take 10 mg by mouth daily.     aspirin EC 81 MG tablet Take 81 mg by mouth daily.     budesonide-formoterol (SYMBICORT) 160-4.5 MCG/ACT inhaler INHALE 2 PUFFS INTO THE LUNGS 2 (TWO) TIMES DAILY. RINSE MOUTH OUT 30.6 each 4   celecoxib (CELEBREX) 200 MG capsule Take 1 capsule (200 mg total) by mouth daily after breakfast. 90 capsule 1   clopidogrel (PLAVIX) 75 MG tablet Take 1 tablet (75 mg total) by mouth daily. 90 tablet 3   donepezil (ARICEPT) 5 MG tablet Take 1 tablet (5 mg total) by mouth at bedtime. Further refills per [facility] neurology 90 tablet 3   FLUoxetine (PROZAC) 20 MG capsule Take 60 mg by mouth daily.  1   memantine (NAMENDA) 5 MG tablet Take 1 tablet (5 mg total) by mouth 2 (two) times daily. Taking qd 180 tablet 3   metoprolol succinate (TOPROL-XL) 50 MG 24 hr tablet Take 1 tablet (50 mg total) by mouth daily. 90 tablet 1   Multiple Vitamins-Minerals (MULTIVITAMIN WITH MINERALS) tablet Take 1 tablet by mouth daily.     pantoprazole (PROTONIX) 40 MG tablet Take 1 tablet (40 mg total) by mouth  daily. 30 min before breakfast 90 tablet 3   Vibegron (GEMTESA) 75 MG TABS Take 75 mg by mouth daily. 30 tablet 0   albuterol (PROVENTIL) (2.5 MG/3ML) 0.083% nebulizer solution TAKE 3 MLS BY NEBULIZATION EVERY 4 (FOUR) HOURS AS NEEDED FOR WHEEZING OR SHORTNESS OF BREATH. 375 mL 3   Budeson-Glycopyrrol-Formoterol (BREZTRI AEROSPHERE) 160-9-4.8 MCG/ACT AERO Inhale 2 puffs into the lungs in the morning and at bedtime. (Patient not taking: Reported on 06/30/2021) 5.9 g 0   hydrOXYzine (ATARAX/VISTARIL) 25 MG tablet Take 25 mg by mouth daily as needed.     NEOMYCIN-POLYMYXIN-HYDROCORTISONE (CORTISPORIN) 1 % SOLN OTIC solution Apply to nail beds from procedure site twice daily after soaks (Patient not taking: Reported on 06/30/2021) 10 mL 0   nitroGLYCERIN (NITROSTAT) 0.4 MG SL tablet Place 1 tablet (0.4 mg total) under the tongue every 5 (five) minutes as needed for chest pain. Maximum of 3 doses 25 tablet 3   ondansetron (ZOFRAN) 4 MG tablet Take 1 tablet (4 mg total) by mouth every 8 (eight) hours as needed. 40 tablet 2   rosuvastatin (CRESTOR) 10 MG tablet Take 1 tablet (10 mg total) by mouth at bedtime. 90 tablet 3   sucralfate (CARAFATE) 1 g tablet TAKE 1 TABLET FOUR TIMES A DAY (Patient not taking: Reported on 06/30/2021) 120 tablet 2   tamsulosin (FLOMAX) 0.4 MG CAPS capsule TAKE 1 CAPSULE (0.4 MG TOTAL) BY MOUTH DAILY AFTER SUPPER. 90 capsule 3   traZODone (DESYREL) 150 MG tablet Take 1 tablet (150 mg total) by mouth at bedtime. 90 tablet 1    Musculoskeletal: Strength & Muscle Tone: within normal limits Gait & Station: normal Patient leans: N/A  Psychiatric Specialty Exam: Physical Exam Vitals and nursing note reviewed.  Constitutional:      Appearance: Normal appearance.  HENT:     Head: Normocephalic.     Nose: Nose normal.  Pulmonary:     Effort: Pulmonary effort is normal.  Musculoskeletal:        General: Normal range of motion.     Cervical back: Normal range of motion.   Neurological:     General: No focal deficit present.     Mental Status: He is alert and oriented to person, place, and time.  Psychiatric:        Attention and Perception: Attention and perception normal.        Mood and Affect: Mood is anxious.        Speech: Speech normal.        Behavior: Behavior normal. Behavior is cooperative.        Thought Content: Thought content normal.        Cognition and Memory: Cognition and memory normal.        Judgment: Judgment normal.    Review of Systems  Psychiatric/Behavioral:  Positive for substance abuse. The patient is nervous/anxious.   All other systems reviewed and are negative.  Blood pressure 105/84, pulse 64, temperature 97.7 F (36.5 C), temperature source Oral, resp. rate 18, height 6' (1.829 m), weight 115.3 kg, SpO2 97 %.Body mass index is 34.48 kg/m.  General Appearance: Casual  Eye Contact:  Good  Speech:  Normal Rate  Volume:  Normal  Mood:  Anxious  Affect:  Congruent  Thought Process:  Coherent and Descriptions of Associations: Intact  Orientation:  Full (Time, Place, and Person)  Thought Content:  WDL and Logical  Suicidal Thoughts:  No  Homicidal Thoughts:  No  Memory:  Immediate;   Good Recent;   Good Remote;   Good  Judgement:  Fair  Insight:  Fair  Psychomotor Activity:  Normal  Concentration:  Concentration: Good and Attention Span: Good  Recall:  Good  Fund of Knowledge:  Good  Language:  Good  Akathisia:  No  Handed:  Right  AIMS (if indicated):     Assets:  Housing Leisure Time Resilience Social Support  ADL's:  Intact  Cognition:  WNL  Sleep:        Physical Exam: Physical Exam Vitals and nursing note reviewed.  Constitutional:      Appearance: Normal appearance.  HENT:     Head: Normocephalic.     Nose: Nose normal.  Pulmonary:     Effort: Pulmonary effort is normal.  Musculoskeletal:        General: Normal range of motion.     Cervical back: Normal range of motion.  Neurological:      General: No focal deficit present.     Mental Status: He is alert and oriented to person, place, and time.  Psychiatric:        Attention and Perception: Attention and perception normal.        Mood and Affect: Mood is anxious.        Speech: Speech normal.        Behavior: Behavior normal. Behavior is cooperative.        Thought Content: Thought content normal.        Cognition and Memory: Cognition and memory normal.        Judgment: Judgment normal.   Review of Systems  Psychiatric/Behavioral:  Positive for substance abuse. The patient is nervous/anxious.   All other systems reviewed and are negative. Blood pressure 105/84, pulse  64, temperature 97.7 F (36.5 C), temperature source Oral, resp. rate 18, height 6' (1.829 m), weight 115.3 kg, SpO2 97 %. Body mass index is 34.48 kg/m.  Treatment Plan Summary: Alcohol use disorder: Recommend detox/rehab, he declined Recommend IOP at [facility]  Alcohol induced mood disorder: -Continue Prozac 20 mg daily at bedtime Follow up with outpatient resources  Insomnia: -Continue Trazodone 150 mg daily at bedtime  Disposition: No evidence of imminent risk to self or others at present.   Patient does not meet criteria for psychiatric inpatient admission.  Waylan Boga, NP 06/30/2021 11:46 AM

## 2021-06-30 NOTE — Discharge Instructions (Signed)
Cleared by psychiatry for discharge home.

## 2021-06-30 NOTE — ED Triage Notes (Addendum)
Pt comes into the ED via [place] under IVC.  Pt called law enforcement himself and informed them that he felt suicidal but had no active plan.,  Pt has a h/o depression and anxiety and states he has been seen at [facility] in the past but they are nor longer accepting his insurance.  Pt also admits to ETOH use tonight and that he called and told the police he was SI because he was lonely and wanted someone to talk to.  Pt is denying any thoughts of SI or HI at this time in triage. Pt is calm and cooperative.

## 2021-06-30 NOTE — ED Notes (Signed)
Pt. Was request for breakfast, pt. Was made aware by this tech; that breakfast has not came down yet; and it should be here anytime now. Pt. Also wanted something to drink, pt. Was provided with ginger ale.

## 2021-06-30 NOTE — BH Assessment (Signed)
Comprehensive Clinical Assessment (CCA) Note  06/30/2021 Christopher Orr [id number]  Chief Complaint:  Chief Complaint  Patient presents with   Suicidal   Visit Diagnosis: Alcohol Induced Mood Disorder   Christopher Orr is a 75 year old male who presents to the ER under IVC due to voicing SI. Patient states he got drunk and said somethings he didn't' mean. Upon arrival to the ER, his BAC was 152.  Now that he is sober is denying SI. He is also denying HI and AV/H. He further reports his last attempt was approximately 50 years ago when he was incarcerated. He cut his wrist. Since then he has had no desire, attempts or gestures. He reports this is the first time he has drank liquor in a long time. Yesterday he drank a fifth of whisky and he believes that is what caused his current ER visit.  CCA Screening, Triage and Referral (STR)  Patient Reported Information How did you hear about [place]? Self  What Is the Reason for Your Visit/Call Today? Patient was intoxicated and voiced SI  How Long Has This Been Causing You Problems? <Week  What Do You Feel Would Help You the Most Today? Alcohol or Drug Use Treatment; Treatment for Depression or other mood problem   Have You Recently Had Any Thoughts About Hurting Yourself? No  Are You Planning to Commit Suicide/Harm Yourself At This time? No   Have you Recently Had Thoughts About [place]? No  Are You Planning to Harm Someone at This Time? No  Explanation: No data recorded  Have You Used Any Alcohol or Drugs in the Past 24 Hours? Yes  How Long Ago Did You Use Drugs or Alcohol? No data recorded What Did You Use and How Much? Alcohol   Do You Currently Have a Therapist/Psychiatrist? No  Name of Therapist/Psychiatrist: No data recorded  Have You Been Recently Discharged From Any Office Practice or Programs? No  Explanation of Discharge From Practice/Program: No data recorded    CCA Screening Triage Referral  Assessment Type of Contact: Face-to-Face  Telemedicine Service Delivery:   Is this Initial or Reassessment? No data recorded Date Telepsych consult ordered in [facility]:  No data recorded Time Telepsych consult ordered in [facility]:  No data recorded Location of Assessment: [facility] ED  Provider Location: [facility] ED   Collateral Involvement: No data recorded  Does Patient Have a [place]? No data recorded Name and Contact of Legal Guardian: No data recorded If Minor and Not Living with Parent(s), Who has Custody? No data recorded Is CPS involved or ever been involved? Never  Is APS involved or ever been involved? Never   Patient Determined To Be At Risk for Harm To Self or Others Based on Review of Patient Reported Information or Presenting Complaint? No  Method: No data recorded Availability of Means: No data recorded Intent: No data recorded Notification Required: No data recorded Additional Information for Danger to Others Potential: No data recorded Additional Comments for Danger to Others Potential: No data recorded Are There Guns or Other Weapons in Your Home? No data recorded Types of Guns/Weapons: No data recorded Are These Weapons Safely Secured?                            No data recorded Who Could Verify You Are Able To Have These Secured: No data recorded Do You Have any Outstanding Charges, Pending Court Dates, Parole/Probation? No data recorded  Contacted To Inform of Risk of Harm To Self or Others: No data recorded   Does Patient Present under Involuntary Commitment? No data recorded IVC Papers Initial File Date: No data recorded  [place] of Residence: [place]   Patient Currently Receiving the Following Services: Not Receiving Services   Determination of Need: Emergent (2 hours)   Options For Referral: ED Visit     CCA Biopsychosocial Patient Reported Schizophrenia/Schizoaffective Diagnosis in Past: No   Strengths: Have some insight, have  support system, and stable housing   Mental Health Symptoms Depression:   Change in energy/activity; Hopelessness   Duration of Depressive symptoms:  Duration of Depressive Symptoms: Less than two weeks   Mania:   N/A   Anxiety:    N/A   Psychosis:   None   Duration of Psychotic symptoms:    Trauma:   N/A   Obsessions:   N/A   Compulsions:   N/A   Inattention:   N/A   Hyperactivity/Impulsivity:   N/A   Oppositional/Defiant Behaviors:   N/A   Emotional Irregularity:   N/A   Other Mood/Personality Symptoms:  No data recorded   Mental Status Exam Appearance and self-care  Stature:   Average   Weight:   Average weight   Clothing:   Careless/inappropriate   Grooming:   Normal   Cosmetic use:   None   Posture/gait:   Normal   Motor activity:   -- (Within normal range)   Sensorium  Attention:   Normal   Concentration:   Normal   Orientation:   X5   Recall/memory:   Normal   Affect and Mood  Affect:   Appropriate   Mood:   Anxious   Relating  Eye contact:   Normal   Facial expression:   Depressed; Responsive   Attitude toward examiner:   Cooperative   Thought and Language  Speech flow:  Clear and Coherent   Thought content:   Appropriate to Mood and Circumstances   Preoccupation:   None   Hallucinations:   None   Organization:  No data recorded  [employer] of Knowledge:   Average   Intelligence:   Average   Abstraction:   Normal   Judgement:   Fair   Art therapist:   Adequate   Insight:   Fair   Decision Making:   Normal   Social Functioning  Social Maturity:   Responsible; Impulsive   Social Judgement:   Normal; "Games developer"   Stress  Stressors:   Museum/gallery curator; Relationship   Coping Ability:   Normal   Skill Deficits:   None   Supports:   Family; Friends/Service system     Religion: Religion/Spirituality Are You A Religious Person?:  No  Leisure/Recreation: Leisure / Recreation Do You Have Hobbies?: No  Exercise/Diet: Exercise/Diet Do You Exercise?: No Have You Gained or Lost A Significant Amount of Weight in the Past Six Months?: No Do You Follow a Special Diet?: No Do You Have Any Trouble Sleeping?: No   CCA Employment/Education Employment/Work Situation: Employment / Work Nurse, children's Situation: Retired Social research officer, government has Been Impacted by Current Illness: No Has Patient ever Been in Passenger transport manager?: No  Education: Education Is Patient Currently Attending School?: No Did You Have An Individualized Education Program (IIEP): No Did You Have Any Difficulty At [employer]?: No Patient's Education Has Been Impacted by Current Illness: No   CCA Family/Childhood History Family and Relationship History: Family history Marital status: Single Does patient  have children?: No  Childhood History:  Childhood History By whom was/is the patient raised?: Mother Did patient suffer any verbal/emotional/physical/sexual abuse as a child?: No Did patient suffer from severe childhood neglect?: No Has patient ever been sexually abused/assaulted/raped as an adolescent or adult?: No Was the patient ever a victim of a crime or a disaster?: No Witnessed domestic violence?: No Has patient been affected by domestic violence as an adult?: No  Child/Adolescent Assessment:     CCA Substance Use Alcohol/Drug Use: Alcohol / Drug Use Pain Medications: See  PTA Prescriptions: See  PTA Over the Counter: See  PTA History of alcohol / drug use?: Yes Longest period of sobriety (when/how long): Unable to quantify Substance #1 Name of Substance 1: Alcohol 1 - Frequency: Daily   ASAM's:  Six Dimensions of Multidimensional Assessment  Dimension 1:  Acute Intoxication and/or Withdrawal Potential:      Dimension 2:  Biomedical Conditions and Complications:      Dimension 3:  Emotional, Behavioral, or Cognitive Conditions  and Complications:     Dimension 4:  Readiness to Change:     Dimension 5:  Relapse, Continued use, or Continued Problem Potential:     Dimension 6:  Recovery/Living Environment:     ASAM Severity Score:    ASAM Recommended Level of Treatment:     Substance use Disorder (SUD)    Recommendations for Services/Supports/Treatments:    Discharge Disposition:    DSM5 Diagnoses: Patient Active Problem List   Diagnosis Date Noted   Alcohol-induced mood disorder ([place]) 06/30/2021   Alcohol intoxication ([place]) 06/30/2021   Bipolar disorder ([place]) 01/29/2021   Dizziness 01/29/2021   Syncope 01/29/2021   Sinus bradycardia 01/29/2021   Tetrahydrocannabinol (THC) use disorder, mild, abuse 01/29/2021   Other chronic pain 01/29/2021   Internal hemorrhoids 01/29/2021   Syncope due to orthostatic hypotension 01/23/2021   Chronic bilateral low back pain without sciatica 12/27/2020   S/P lumbar fusion 12/27/2020   Alcohol abuse 11/01/2020   Malignant neoplasm of lower lobe of left lung ([place]) 05/15/2020   SOB (shortness of breath) on exertion 05/15/2020   Late onset Alzheimer's disease without behavioral disturbance ([place]) 05/07/2020   Squamous carcinoma of lung, left ([place]) 11/26/2019   Goals of care, counseling/discussion 11/26/2019   Nodule of lower lobe of left lung 11/09/2019   Lung cancer ([place]) 11/01/2019   Abnormal findings on diagnostic imaging of digestive system    Polyp of transverse colon    Prediabetes 09/14/2019   Kidney lesion 08/15/2019   History of prostate cancer 08/15/2019   Arthritis 08/15/2019   Hernia, inguinal, bilateral 08/15/2019   History of pancreatitis 08/15/2019   Cirrhosis of liver ([place]) 08/15/2019   Insomnia 08/15/2019   Memory loss 08/15/2019   Nodule of middle lobe of right lung 08/15/2019   Tobacco abuse 08/15/2019   CAD (coronary artery disease)    Degenerative disc disease, lumbar 11/01/2018   Cigarette smoker 08/05/2018   Acute respiratory failure  with hypoxia ([place]) 07/06/2018   Bilateral inguinal hernia, without obstruction or gangrene, recurrent    Umbilical hernia without obstruction and without gangrene    Panic attacks 06/14/2018   Benzodiazepine dependence ([place]) 06/14/2018   Post traumatic stress disorder (PTSD) 06/14/2018   Depression, recurrent ([place]) 06/14/2018   Coronary artery disease involving native heart with angina pectoris ([place]) 05/31/2018   Osteoarthritis of multiple joints 05/31/2018   Hyperlipidemia 05/31/2018   Chronic viral hepatitis C ([place]) 09/24/2014   Chest pain 07/28/2014   COPD (  chronic obstructive pulmonary disease) ([place]) 07/28/2014   Anxiety and depression 07/28/2014   Aspiration pneumonia ([place]) 07/28/2014   Elevated troponin I level 07/28/2014   Acute kidney injury ([place]) 07/28/2014   GERD (gastroesophageal reflux disease) 07/28/2014     Referrals to Alternative Service(s): Referred to Alternative Service(s):   Place:   Date:   Time:    Referred to Alternative Service(s):   Place:   Date:   Time:    Referred to Alternative Service(s):   Place:   Date:   Time:    Referred to Alternative Service(s):   Place:   Date:   Time:     Gunnar Fusi MS, LCAS, [facility], [facility] Therapeutic Triage Specialist 06/30/2021 1:47 PM

## 2021-06-30 NOTE — ED Triage Notes (Signed)
Pt arrived via [facility] with IVC paperwork, pt calm and cooperative on arrival, pt called law enforcement and told them that he wants to kill himself, but did not currently have a plan. Pt told [employer] and a crisis line that he had no reason to live. Pt has been to [facility] before, but they no longer take his insurance. Pt reported that he has been taking his prescribed medications.

## 2021-06-30 NOTE — ED Notes (Signed)
Pharmacy tech at bedside 

## 2021-06-30 NOTE — ED Notes (Signed)
IVC/pending psych consult

## 2021-06-30 NOTE — ED Notes (Signed)
Pt given a warm blanket 

## 2021-06-30 NOTE — ED Provider Notes (Signed)
[facility] Emergency Department Provider Note  ____________________________________________   Event Date/Time   First MD Initiated Contact with Patient 06/30/21 [phone]     (approximate)  I have reviewed the triage vital signs and the nursing notes.   HISTORY  Chief Complaint Suicidal  Level 5 caveat: History may be limited by acute alcohol intoxication.  HPI Christopher Orr is a 75 y.o. male with medical and psychiatric history as listed below who presents under IVC by law enforcement.  Allegedly he called law enforcement and stated that he was depressed and wanted to kill himself.  When officers responded, he said he was just kidding but he is depressed and he has nightmares and he was lonely and wanted to talk somebody.  He also admitted to alcohol use tonight.  He was placed under involuntary commitment and brought to [facility] for further assessment.  The patient tells me that he was lonely and depressed that he has trouble sleeping and he has nightmares that keep him from sleeping.  He was previously on Xanax but he is no longer able to go to [facility] so he has not been on Xanax for about a month and has not been able to sleep well since that time.  His symptoms are severe and nothing in particular makes them better or worse.  He denies chest pain, shortness of breath, nausea, vomiting, and abdominal pain.     Past Medical History:  Diagnosis Date   Alcohol abuse    pt states it is marijuana   Anemia    Arthritis    Bipolar disorder (HCC)    BPH (benign prostatic hypertrophy) with urinary obstruction    CAD (coronary artery disease)    a. s/p stent to LAD in 2006 at [facility], EF 60% at that time.   Cancer [facility])    prostate   CHF (congestive heart failure) (HCC)    Chronic pancreatitis (HCC)    Cirrhosis (HCC)    COPD (chronic obstructive pulmonary disease) ([place])    COVID-19    08/22/20   Dementia ([place])    early dementia   Depression    Diverticulitis     12/06/20 [facility]    Diverticulosis    Drug use    Dyspnea    ED (erectile dysfunction)    Gastritis    GERD (gastroesophageal reflux disease)    Headache    occasional migraines   Hepatitis C    treated   History of lumbar fusion    Hyperlipidemia    Hypertension    patient denies having high blood pressure   Lung cancer ([place])    Mitral regurgitation    Pancreatitis    x 2    Pneumonia    07/06/18    Prostate cancer ([place])    with seeds   PTSD (post-traumatic stress disorder)    Rib fracture    s/p fall 03/15/19    Sinus disease    Urge incontinence     Patient Active Problem List   Diagnosis Date Noted   Bipolar disorder ([place]) 01/29/2021   Dizziness 01/29/2021   Syncope 01/29/2021   Sinus bradycardia 01/29/2021   Tetrahydrocannabinol (THC) use disorder, mild, abuse 01/29/2021   Other chronic pain 01/29/2021   Internal hemorrhoids 01/29/2021   Syncope due to orthostatic hypotension 01/23/2021   Chronic bilateral low back pain without sciatica 12/27/2020   S/P lumbar fusion 12/27/2020   Alcohol abuse 11/01/2020   Malignant neoplasm of lower lobe  of left lung ([place]) 05/15/2020   SOB (shortness of breath) on exertion 05/15/2020   Late onset Alzheimer's disease without behavioral disturbance ([place]) 05/07/2020   Squamous carcinoma of lung, left ([place]) 11/26/2019   Goals of care, counseling/discussion 11/26/2019   Nodule of lower lobe of left lung 11/09/2019   Lung cancer ([place]) 11/01/2019   Abnormal findings on diagnostic imaging of digestive system    Polyp of transverse colon    Prediabetes 09/14/2019   Kidney lesion 08/15/2019   History of prostate cancer 08/15/2019   Arthritis 08/15/2019   Hernia, inguinal, bilateral 08/15/2019   History of pancreatitis 08/15/2019   Cirrhosis of liver ([place]) 08/15/2019   Insomnia 08/15/2019   Memory loss 08/15/2019   Nodule of middle lobe of right lung 08/15/2019   Tobacco abuse 08/15/2019   CAD (coronary artery disease)     Degenerative disc disease, lumbar 11/01/2018   Cigarette smoker 08/05/2018   Acute respiratory failure with hypoxia ([place]) 07/06/2018   Bilateral inguinal hernia, without obstruction or gangrene, recurrent    Umbilical hernia without obstruction and without gangrene    Panic attacks 06/14/2018   Benzodiazepine dependence ([place]) 06/14/2018   Post traumatic stress disorder (PTSD) 06/14/2018   Depression, recurrent ([place]) 06/14/2018   Coronary artery disease involving native heart with angina pectoris ([place]) 05/31/2018   Osteoarthritis of multiple joints 05/31/2018   Hyperlipidemia 05/31/2018   Chronic viral hepatitis C ([place]) 09/24/2014   Chest pain 07/28/2014   COPD (chronic obstructive pulmonary disease) ([place]) 07/28/2014   Anxiety and depression 07/28/2014   Aspiration pneumonia ([place]) 07/28/2014   Elevated troponin I level 07/28/2014   Acute kidney injury ([place]) 07/28/2014   GERD (gastroesophageal reflux disease) 07/28/2014    Past Surgical History:  Procedure Laterality Date   CARDIAC CATHETERIZATION  2006   cateract  2013   COLONOSCOPY WITH PROPOFOL N/A 10/04/2019   Procedure: COLONOSCOPY WITH PROPOFOL;  Surgeon: Lucilla Lame, MD;  Location: [facility] ENDOSCOPY;  Service: Endoscopy;  Laterality: N/A;   CORONARY ANGIOPLASTY  2006   CORONARY STENT PLACEMENT  2006   x 1   ESOPHAGOGASTRODUODENOSCOPY N/A 12/22/2014   Procedure: ESOPHAGOGASTRODUODENOSCOPY (EGD);  Surgeon: [name], MD;  Location: [facility] ENDOSCOPY;  Service: Endoscopy;  Laterality: N/A;   ESOPHAGOGASTRODUODENOSCOPY (EGD) WITH PROPOFOL N/A 10/04/2019   Procedure: ESOPHAGOGASTRODUODENOSCOPY (EGD) WITH PROPOFOL;  Surgeon: Lucilla Lame, MD;  Location: [facility] ENDOSCOPY;  Service: Endoscopy;  Laterality: N/A;   HERNIA REPAIR  2015   left groin   INTERCOSTAL NERVE BLOCK Left 11/09/2019   Procedure: Intercostal Nerve Block;  Surgeon: Melrose Nakayama, MD;  Location: [place];  Service: Thoracic;  Laterality: Left;   LUMBAR FUSION      RADIOACTIVE SEED IMPLANT N/A 04/22/2016   Procedure: RADIOACTIVE SEED IMPLANT/BRACHYTHERAPY IMPLANT;  Surgeon: Hollice Espy, MD;  Location: [facility] ORS;  Service: Urology;  Laterality: N/A;   ROBOT ASSISTED INGUINAL HERNIA REPAIR Bilateral 07/06/2018   Procedure: ROBOT ASSISTED INGUINAL HERNIA REPAIR;  Surgeon: Jules Husbands, MD;  Location: [facility] ORS;  Service: General;  Laterality: Bilateral;   TONSILLECTOMY     UMBILICAL HERNIA REPAIR N/A 07/06/2018   Procedure: LAPAROSCOPIC ROBOT ASSISTED UMBILICAL HERNIA;  Surgeon: Jules Husbands, MD;  Location: [facility] ORS;  Service: General;  Laterality: N/A;   XI ROBOTIC ASSISTED THORACOSCOPY- SEGMENTECTOMY Left 11/09/2019   Procedure: XI ROBOTIC ASSISTED THORACOSCOPY-WEDGE RESECTION LEFT LOWER LOBE;  Surgeon: Melrose Nakayama, MD;  Location: [place];  Service: Thoracic;  Laterality: Left;    Prior to Admission  medications   Medication Sig Start Date End Date Taking? Authorizing Provider  albuterol (PROVENTIL) (2.5 MG/3ML) 0.083% nebulizer solution TAKE 3 MLS BY NEBULIZATION EVERY 4 (FOUR) HOURS AS NEEDED FOR WHEEZING OR SHORTNESS OF BREATH. 11/02/20   McLean-Scocuzza, [name], MD  albuterol (VENTOLIN HFA) 108 (90 Base) MCG/ACT inhaler Inhale 1-2 puffs into the lungs every 6 (six) hours as needed for wheezing or shortness of breath. 06/28/21   McLean-Scocuzza, [name], MD  alprazolam Duanne Moron) 1 MG tablet Take 1 tablet (1 mg total) by mouth as directed. 1/2 in am 1 pill qhs 08/12/19   McLean-Scocuzza, [name], MD  ARIPiprazole (ABILIFY) 10 MG tablet Take 10 mg by mouth daily.    [provider]  aspirin EC 81 MG tablet Take 81 mg by mouth daily.    [provider]  Budeson-Glycopyrrol-Formoterol (BREZTRI AEROSPHERE) 160-9-4.8 MCG/ACT AERO Inhale 2 puffs into the lungs in the morning and at bedtime. 10/29/20   Tyler Pita, MD  budesonide-formoterol (SYMBICORT) 160-4.5 MCG/ACT inhaler INHALE 2 PUFFS INTO THE LUNGS 2 (TWO) TIMES DAILY. RINSE  MOUTH OUT 06/28/21   McLean-Scocuzza, [name], MD  celecoxib (CELEBREX) 200 MG capsule Take 1 capsule (200 mg total) by mouth daily after breakfast. 06/28/21   McLean-Scocuzza, [name], MD  clopidogrel (PLAVIX) 75 MG tablet Take 1 tablet (75 mg total) by mouth daily. 06/28/21   McLean-Scocuzza, [name], MD  donepezil (ARICEPT) 5 MG tablet Take 1 tablet (5 mg total) by mouth at bedtime. Further refills per [facility] neurology 02/18/21   McLean-Scocuzza, [name], MD  FLUoxetine (PROZAC) 20 MG capsule Take 60 mg by mouth daily. 06/14/18   [provider]  hydrOXYzine (ATARAX/VISTARIL) 25 MG tablet Take 25 mg by mouth daily as needed. 10/31/20   [provider]  memantine (NAMENDA) 5 MG tablet Take 1 tablet (5 mg total) by mouth 2 (two) times daily. Taking qd 08/23/20   McLean-Scocuzza, [name], MD  metoprolol succinate (TOPROL-XL) 50 MG 24 hr tablet Take 1 tablet (50 mg total) by mouth daily. 06/04/21   Furth, Cadence H, PA-C  Multiple Vitamins-Minerals (MULTIVITAMIN WITH MINERALS) tablet Take 1 tablet by mouth daily.    [provider]  NEOMYCIN-POLYMYXIN-HYDROCORTISONE (CORTISPORIN) 1 % SOLN OTIC solution Apply to nail beds from procedure site twice daily after soaks 04/03/21   McDonald, Stephan Minister, DPM  nitroGLYCERIN (NITROSTAT) 0.4 MG SL tablet Place 1 tablet (0.4 mg total) under the tongue every 5 (five) minutes as needed for chest pain. Maximum of 3 doses 10/25/20   Furth, Cadence H, PA-C  ondansetron (ZOFRAN) 4 MG tablet Take 1 tablet (4 mg total) by mouth every 8 (eight) hours as needed. 11/01/20   McLean-Scocuzza, [name], MD  pantoprazole (PROTONIX) 40 MG tablet Take 1 tablet (40 mg total) by mouth daily. 30 min before breakfast 06/28/21   McLean-Scocuzza, [name], MD  rosuvastatin (CRESTOR) 10 MG tablet Take 1 tablet (10 mg total) by mouth at bedtime. 06/28/21   McLean-Scocuzza, [name], MD  sucralfate (CARAFATE) 1 g tablet TAKE 1 TABLET FOUR TIMES A DAY 06/04/21   McLean-Scocuzza, [name], MD   tamsulosin (FLOMAX) 0.4 MG CAPS capsule TAKE 1 CAPSULE (0.4 MG TOTAL) BY MOUTH DAILY AFTER SUPPER. 10/24/20   McLean-Scocuzza, [name], MD  traZODone (DESYREL) 150 MG tablet Take 1 tablet (150 mg total) by mouth at bedtime. 06/18/21   McLean-Scocuzza, [name], MD  Vibegron (GEMTESA) 75 MG TABS Take 75 mg by mouth daily. 06/25/21  Hollice Espy, MD    Allergies Patient has no known allergies.  Family History  Problem Relation Age of Onset   Heart disease Father        Unclear details. Father passed in late 64's 2/2 cancer.   Colon cancer Father    Alcohol abuse Sister    Drug abuse Sister    Anxiety disorder Sister    Depression Sister    COPD Brother    Obesity Brother    Kidney disease Neg Hx    Prostate cancer Neg Hx     Social History Social History   Tobacco Use   Smoking status: Every Day    Packs/day: 1.00    Years: 3.00    Pack years: 3.00    Types: Cigarettes    Start date: 07/28/1962   Smokeless tobacco: Never   Tobacco comments:    started back in march. 1 pack a day.  Vaping Use   Vaping Use: Never used  Substance Use Topics   Alcohol use: Yes    Alcohol/week: 1.0 standard drink    Types: 1 Cans of beer per week    Comment: 2-3 beers a day   Drug use: Yes    Frequency: 7.0 times per week    Types: Marijuana    Comment: Endorsed heroin 06/2014, UDS also + benzos, opiates, THC, neg for cocaine at that time.    Review of Systems Level 5 caveat: History may be limited by acute alcohol intoxication.  Constitutional: No fever/chills Eyes: No visual changes. ENT: No sore throat. Cardiovascular: Denies chest pain. Respiratory: Denies shortness of breath. Gastrointestinal: No abdominal pain.  No nausea, no vomiting.  No diarrhea.  No constipation. Genitourinary: Negative for dysuria. Musculoskeletal: Negative for neck pain.  Negative for back pain. Integumentary: Negative for rash. Neurological: Negative for headaches, focal weakness or  numbness. Psychiatric: Depression, insomnia, nightmares, alcohol use, questionable suicidal ideation  ____________________________________________   PHYSICAL EXAM:  VITAL SIGNS: ED Triage Vitals  Enc Vitals Group     BP 06/30/21 0432 107/71     Pulse Rate 06/30/21 0432 64     Resp 06/30/21 0432 20     Temp 06/30/21 0432 97.7 F (36.5 C)     Temp Source 06/30/21 0432 Oral     SpO2 06/30/21 0432 95 %     Weight 06/30/21 0445 115.3 kg (254 lb 3.2 oz)     Height 06/30/21 0445 1.829 m (6')     Head Circumference --      Peak Flow --      Pain Score 06/30/21 0445 7     Pain Loc --      Pain Edu? --      Excl. in [place]? --     Constitutional: Alert and oriented.  Eyes: Conjunctivae are normal.  Head: Atraumatic. Nose: No congestion/rhinnorhea. Mouth/Throat: Patient is wearing a mask. Neck: No stridor.  No meningeal signs.   Cardiovascular: Normal rate, regular rhythm. Good peripheral circulation. Respiratory: Normal respiratory effort.  No retractions. Gastrointestinal: Soft and nontender. No distention.  Musculoskeletal: No lower extremity tenderness nor edema. No gross deformities of extremities. Neurologic:  Normal speech and language. No gross focal neurologic deficits are appreciated.  Skin:  Skin is warm, dry and intact. Psychiatric: Mood and affect are somewhat depressed.  He is now denying SI but admits to alcohol use and long-term depression.  ____________________________________________   LABS (all labs ordered are listed, but only abnormal results are displayed)  Labs Reviewed  COMPREHENSIVE METABOLIC PANEL - Abnormal; Notable for the following components:      Result Value   Calcium 8.8 (*)    All other components within normal limits  ETHANOL - Abnormal; Notable for the following components:   Alcohol, Ethyl (B) 152 (*)    All other components within normal limits  SALICYLATE LEVEL - Abnormal; Notable for the following components:   Salicylate Lvl <4.0 (*)     All other components within normal limits  ACETAMINOPHEN LEVEL - Abnormal; Notable for the following components:   Acetaminophen (Tylenol), Serum <10 (*)    All other components within normal limits  CBC - Abnormal; Notable for the following components:   RBC 3.73 (*)    Hemoglobin 12.7 (*)    HCT 37.4 (*)    MCV 100.3 (*)    All other components within normal limits  URINE DRUG SCREEN, QUALITATIVE ([facility] ONLY) - Abnormal; Notable for the following components:   Cannabinoid 50 Ng, Ur [place] POSITIVE (*)    Benzodiazepine, Ur Scrn POSITIVE (*)    All other components within normal limits   ____________________________________________    INITIAL IMPRESSION / MDM / ASSESSMENT AND PLAN / ED COURSE  As part of my medical decision making, I reviewed the following data within the electronic MEDICAL RECORD NUMBER Nursing notes reviewed and incorporated, Labs reviewed , Old chart reviewed, A consult was requested and obtained from this/these consultant(s) , and Notes from prior ED visits   Differential diagnosis includes, but is not limited to, substance-induced mood disorder, depression, bipolar disorder.  Patient was intoxicated upon arrival which likely contributed to his poor judgment and decision-making.  However he was placed under involuntary commitment by law enforcement for the statements he was making about wanting to kill himself.  I think it is reasonable at this point to uphold the IVC and have him evaluated by psychiatry.  He is not reporting any medical complaints other than a headache for which I ordered ibuprofen 600 mg by mouth.  Vital signs stable.  Acetaminophen and salicylate levels are negative.  Ethanol 152.  CMP, CBC are both within normal limits.  Urine drug screen is positive for cannabinoids and benzodiazepines, which is notable because he told me he has not been able to get any Xanax for about a month.   ____________________________________________  FINAL CLINICAL  IMPRESSION(S) / ED DIAGNOSES  Final diagnoses:  Substance induced mood disorder (HCC)  Alcoholic intoxication with complication ([place])     MEDICATIONS GIVEN DURING THIS VISIT:  Medications  ibuprofen (ADVIL) tablet 600 mg (600 mg Oral Given 06/30/21 0644)     ED Discharge Orders     None        Note:  This document was prepared using Dragon voice recognition software and may include unintentional dictation errors.   Hinda Kehr, MD 06/30/21 [phone]

## 2021-07-02 NOTE — Progress Notes (Signed)
Electrophysiology Office Note:    Date:  07/03/2021   ID:  Christopher Orr, DOB 1946/06/06, MRN [id number]  PCP:  Orr, Christopher Glow, MD  [facility] Cardiologist:  Kathlyn Sacramento, MD  [facility] Electrophysiologist:  Vickie Epley, MD   Referring MD: Antony Madura, PA-C   Chief Complaint: PACs  History of Present Illness:    Christopher Orr is a 75 y.o. male who presents for an evaluation of PACs at the request of Christopher Orr, [place]. Their medical history includes alcohol abuse, cirrhosis, THC use, bipolar disorder, coronary artery disease post PCI in 2006, chronic pancreatitis, COPD, hepatitis C treated, hypertension.  The patient was seen by Christopher on June 14, 2021.  He had previously worn a heart monitor in October of this year which showed a 7.8% burden of PACs. He also has a history of syncope in the setting of alcohol use. He is never felt with PACs.  He is felt no different on the metoprolol.  His biggest complaint today is trouble sleeping and trouble with his bladder.    Past Medical History:  Diagnosis Date   Alcohol abuse    pt states it is marijuana   Anemia    Arthritis    Bipolar disorder (HCC)    BPH (benign prostatic hypertrophy) with urinary obstruction    CAD (coronary artery disease)    a. s/p stent to LAD in 2006 at [facility], EF 60% at that time.   [facility])    prostate   CHF (congestive heart failure) (HCC)    Chronic pancreatitis (HCC)    Cirrhosis (HCC)    COPD (chronic obstructive pulmonary disease) ([place])    COVID-19    08/22/20   Dementia ([place])    early dementia   Depression    Diverticulitis    12/06/20 [facility]    Diverticulosis    Drug use    Dyspnea    ED (erectile dysfunction)    Gastritis    GERD (gastroesophageal reflux disease)    Headache    occasional migraines   Hepatitis C    treated   History of lumbar fusion    Hyperlipidemia    Hypertension    patient denies having high blood pressure   Lung cancer  ([place])    Mitral regurgitation    Pancreatitis    x 2    Pneumonia    07/06/18    Prostate cancer ([place])    with seeds   PTSD (post-traumatic stress disorder)    Rib fracture    s/p fall 03/15/19    Sinus disease    Urge incontinence     Past Surgical History:  Procedure Laterality Date   CARDIAC CATHETERIZATION  2006   cateract  2013   COLONOSCOPY WITH PROPOFOL N/A 10/04/2019   Procedure: COLONOSCOPY WITH PROPOFOL;  Surgeon: Lucilla Lame, MD;  Location: [facility] ENDOSCOPY;  Service: Endoscopy;  Laterality: N/A;   CORONARY ANGIOPLASTY  2006   CORONARY STENT PLACEMENT  2006   x 1   ESOPHAGOGASTRODUODENOSCOPY N/A 12/22/2014   Procedure: ESOPHAGOGASTRODUODENOSCOPY (EGD);  Surgeon: [name], MD;  Location: [facility] ENDOSCOPY;  Service: Endoscopy;  Laterality: N/A;   ESOPHAGOGASTRODUODENOSCOPY (EGD) WITH PROPOFOL N/A 10/04/2019   Procedure: ESOPHAGOGASTRODUODENOSCOPY (EGD) WITH PROPOFOL;  Surgeon: Lucilla Lame, MD;  Location: [facility] ENDOSCOPY;  Service: Endoscopy;  Laterality: N/A;   HERNIA REPAIR  2015   left groin   INTERCOSTAL NERVE BLOCK Left 11/09/2019   Procedure: Intercostal Nerve Block;  Surgeon: Melrose Nakayama, MD;  Location: [place];  Service: Thoracic;  Laterality: Left;   LUMBAR FUSION     RADIOACTIVE SEED IMPLANT N/A 04/22/2016   Procedure: RADIOACTIVE SEED IMPLANT/BRACHYTHERAPY IMPLANT;  Surgeon: Hollice Espy, MD;  Location: [facility] ORS;  Service: Urology;  Laterality: N/A;   ROBOT ASSISTED INGUINAL HERNIA REPAIR Bilateral 07/06/2018   Procedure: ROBOT ASSISTED INGUINAL HERNIA REPAIR;  Surgeon: Jules Husbands, MD;  Location: [facility] ORS;  Service: General;  Laterality: Bilateral;   TONSILLECTOMY     UMBILICAL HERNIA REPAIR N/A 07/06/2018   Procedure: LAPAROSCOPIC ROBOT ASSISTED UMBILICAL HERNIA;  Surgeon: Jules Husbands, MD;  Location: [facility] ORS;  Service: General;  Laterality: N/A;   XI ROBOTIC ASSISTED THORACOSCOPY- SEGMENTECTOMY Left 11/09/2019   Procedure: XI ROBOTIC  ASSISTED THORACOSCOPY-WEDGE RESECTION LEFT LOWER LOBE;  Surgeon: Melrose Nakayama, MD;  Location: [facility] OR;  Service: Thoracic;  Laterality: Left;    Current Medications: Current Meds  Medication Sig   albuterol (PROVENTIL) (2.5 MG/3ML) 0.083% nebulizer solution TAKE 3 MLS BY NEBULIZATION EVERY 4 (FOUR) HOURS AS NEEDED FOR WHEEZING OR SHORTNESS OF BREATH.   albuterol (VENTOLIN HFA) 108 (90 Base) MCG/ACT inhaler Inhale 1-2 puffs into the lungs every 6 (six) hours as needed for wheezing or shortness of breath.   ARIPiprazole (ABILIFY) 10 MG tablet Take 10 mg by mouth daily.   aspirin EC 81 MG tablet Take 81 mg by mouth daily.   budesonide-formoterol (SYMBICORT) 160-4.5 MCG/ACT inhaler INHALE 2 PUFFS INTO THE LUNGS 2 (TWO) TIMES DAILY. RINSE MOUTH OUT   celecoxib (CELEBREX) 200 MG capsule Take 1 capsule (200 mg total) by mouth daily after breakfast.   clopidogrel (PLAVIX) 75 MG tablet Take 1 tablet (75 mg total) by mouth daily.   donepezil (ARICEPT) 5 MG tablet Take 1 tablet (5 mg total) by mouth at bedtime. Further refills per [facility] neurology   FLUoxetine (PROZAC) 20 MG capsule Take 60 mg by mouth daily.   hydrOXYzine (ATARAX/VISTARIL) 25 MG tablet Take 25 mg by mouth daily as needed.   memantine (NAMENDA) 5 MG tablet Take 1 tablet (5 mg total) by mouth 2 (two) times daily. Taking qd   metoprolol succinate (TOPROL-XL) 50 MG 24 hr tablet Take 1 tablet (50 mg total) by mouth daily.   Multiple Vitamins-Minerals (MULTIVITAMIN WITH MINERALS) tablet Take 1 tablet by mouth daily.   NEOMYCIN-POLYMYXIN-HYDROCORTISONE (CORTISPORIN) 1 % SOLN OTIC solution Apply to nail beds from procedure site twice daily after soaks   nitroGLYCERIN (NITROSTAT) 0.4 MG SL tablet Place 1 tablet (0.4 mg total) under the tongue every 5 (five) minutes as needed for chest pain. Maximum of 3 doses   ondansetron (ZOFRAN) 4 MG tablet Take 1 tablet (4 mg total) by mouth every 8 (eight) hours as needed.   pantoprazole (PROTONIX) 40 MG  tablet Take 1 tablet (40 mg total) by mouth daily. 30 min before breakfast   rosuvastatin (CRESTOR) 10 MG tablet Take 1 tablet (10 mg total) by mouth at bedtime.   tamsulosin (FLOMAX) 0.4 MG CAPS capsule TAKE 1 CAPSULE (0.4 MG TOTAL) BY MOUTH DAILY AFTER SUPPER.   traZODone (DESYREL) 150 MG tablet Take 1 tablet (150 mg total) by mouth at bedtime.   Vibegron (GEMTESA) 75 MG TABS Take 75 mg by mouth daily.     Allergies:   Patient has no known allergies.   Social History   Socioeconomic History   Marital status: Divorced    Spouse name: Not on file   Number of children:  4   Years of education: Not on file   Highest education level: GED or equivalent  Occupational History   Occupation: Aeronautical engineer    Comment: retired  Tobacco Use   Smoking status: Every Day    Packs/day: 1.00    Years: 3.00    Pack years: 3.00    Types: Cigarettes    Start date: 07/28/1962   Smokeless tobacco: Never   Tobacco comments:    started back in march. 1 pack a day.  Vaping Use   Vaping Use: Never used  Substance and Sexual Activity   Alcohol use: Yes    Alcohol/week: 1.0 standard drink    Types: 1 Cans of beer per week    Comment: 2-3 beers a day   Drug use: Yes    Frequency: 7.0 times per week    Types: Marijuana    Comment: Endorsed heroin 06/2014, UDS also + benzos, opiates, THC, neg for cocaine at that time.   Sexual activity: Not Currently  Other Topics Concern   Not on file  Social History Narrative   Lives at home    Has kids and grandkids   Smoker x 55 years 1 ppd as of 07/2019 he did quit x 2 years    Drinks 1 pt liq qd as of 07/2019    Social Determinants of Health   Financial Resource Strain: Low Risk    Difficulty of Paying Living Expenses: Not hard at all  Food Insecurity: No Food Insecurity   Worried About Charity fundraiser in the Last Year: Never true   Arboriculturist in the Last Year: Never true  Transportation Needs: No Transportation Needs   Lack of  Transportation (Medical): No   Lack of Transportation (Non-Medical): No  Physical Activity: Not on file  Stress: No Stress Concern Present   Feeling of Stress : Not at all  Social Connections: Moderately Isolated   Frequency of Communication with Friends and Family: More than three times a week   Frequency of Social Gatherings with Friends and Family: Once a week   Attends Religious Services: Never   Marine scientist or Organizations: Yes   Attends Music therapist: More than 4 times per year   Marital Status: Divorced     Family History: The patient's family history includes Alcohol abuse in his sister; Anxiety disorder in his sister; COPD in his brother; Colon cancer in his father; Depression in his sister; Drug abuse in his sister; Heart disease in his father; Obesity in his brother. There is no history of Kidney disease or Prostate cancer.  ROS:   Please see the history of present illness.    All other systems reviewed and are negative.  EKGs/Labs/Other Studies Reviewed:    The following studies were reviewed today:  May 31, 2021 ZIO 8% PAC burden, 42 episodes of SVT, longest episode lasting 5 beats  EKG:  The ekg ordered today demonstrates sinus rhythm.  No PACs.   Recent Labs: 10/30/2020: Magnesium 2.0 06/30/2021: ALT 17; BUN 15; Creatinine, Ser 0.89; Hemoglobin 12.7; Platelets 292; Potassium 4.1; Sodium 136  Recent Lipid Panel    Component Value Date/Time   CHOL 111 05/21/2020 1051   TRIG 77 05/21/2020 1051   HDL 49 05/21/2020 1051   CHOLHDL 2.3 05/21/2020 1051   VLDL 15 05/21/2020 1051   LDLCALC 47 05/21/2020 1051    Physical Exam:    VS:  BP 130/72   Pulse 70  Ht 6' (1.829 m)   Wt 254 lb 9.6 oz (115.5 kg)   SpO2 95%   BMI 34.53 kg/m     Wt Readings from Last 3 Encounters:  07/03/21 254 lb 9.6 oz (115.5 kg)  06/30/21 254 lb 3.2 oz (115.3 kg)  06/28/21 254 lb 3.2 oz (115.3 kg)     GEN:  Well nourished, well developed in no acute  distress. HEENT: Normal NECK: No JVD; No carotid bruits LYMPHATICS: No lymphadenopathy CARDIAC: RRR, no murmurs, rubs, gallops RESPIRATORY:  Clear to auscultation without rales, wheezing or rhonchi  ABDOMEN: Soft, non-tender, non-distended MUSCULOSKELETAL:  No edema; No deformity  SKIN: Warm and dry NEUROLOGIC:  Alert and oriented x 3 PSYCHIATRIC:  Normal affect       ASSESSMENT:    1. PAC (premature atrial contraction)   2. SVT (supraventricular tachycardia) ([place])   3. Coronary artery disease involving native coronary artery of native heart with angina pectoris (HCC)    PLAN:    In order of problems listed above:  #Asymptomatic PACs Continue metoprolol succinate 50 mg by mouth once daily  #Coronary artery disease Encourage cessation of tobacco Continue aspirin and Plavix  Patient asked for benzodiazepine during today's visit.  I referred him to his primary care physician to discuss further.  Follow-up with EP as needed.   Medication Adjustments/Labs and Tests Ordered: Current medicines are reviewed at length with the patient today.  Concerns regarding medicines are outlined above.  Orders Placed This Encounter  Procedures   EKG 12-Lead   No orders of the defined types were placed in this encounter.    Signed, Hilton Cork. Quentin Ore, MD, [facility], [facility] 07/03/2021 10:55 AM    Electrophysiology [facility]

## 2021-07-03 ENCOUNTER — Encounter: Payer: Self-pay | Admitting: Cardiology

## 2021-07-03 ENCOUNTER — Other Ambulatory Visit: Payer: Self-pay

## 2021-07-03 ENCOUNTER — Ambulatory Visit: Payer: Medicare HMO | Admitting: Cardiology

## 2021-07-03 VITALS — BP 130/72 | HR 70 | Ht 72.0 in | Wt 254.6 lb

## 2021-07-03 DIAGNOSIS — I491 Atrial premature depolarization: Secondary | ICD-10-CM | POA: Diagnosis not present

## 2021-07-03 DIAGNOSIS — I471 Supraventricular tachycardia: Secondary | ICD-10-CM | POA: Diagnosis not present

## 2021-07-03 DIAGNOSIS — I25119 Atherosclerotic heart disease of native coronary artery with unspecified angina pectoris: Secondary | ICD-10-CM | POA: Diagnosis not present

## 2021-07-03 NOTE — Patient Instructions (Addendum)

## 2021-07-10 NOTE — H&P (View-Only) (Signed)
° °  07/11/2021 CC:  Chief Complaint  Patient presents with   Cysto     HPI: Christopher Orr is a 75 y.o. male with a personal history of of prostate cancer, BPH with LUTS, urinary frequency, ED, renal cyst, and microscopic hematuria, who presents today for a cystoscopy.   He was diagnosed with prostate cancer on 12/20/2015. Pathology revealed low risk prostate cancer with Gleason 3+3 involving 7 our of 12 cores  including all cores on left and single core at right apex up to 70% tissue.  TRUS volume 42 cc. He is s/p brachytherapy seed implant on 04/22/2016.   He underwent an MRI on 12/27/2020 for further evaluation of lesion on the right  kidney. It showed that the lesion of concern in the right kidney anteriorly has reduced complexity compared to previously, and is currently categorized as a Bosniak category 2 complex but benign cyst. Bosniak category 1 and category 2 cysts are present in the kidneys.  He has been managed for urinary frequency for many years he was on Flomax and Mybetriq 50 mg. Contributing factors include alcohol abuse.  He was just admitted for alcohol abuse a suicidal ideation.   He is doing well today.   Vitals:   07/11/21 0952  BP: 133/81  Pulse: 92  NED. A&Ox3.   No respiratory distress   Abd soft, NT, ND Normal phallus with bilateral descended testicles  Cystoscopy Procedure Note  Patient identification was confirmed, informed consent was obtained, and patient was prepped using Betadine solution.  Lidocaine jelly was administered per urethral meatus.     Pre-Procedure: - Inspection reveals a normal caliber ureteral meatus.  Procedure: The flexible cystoscope was introduced without difficulty - Urethral stricture at junction of bulbar and prostatic urethra; unable to advance scope, approximately 10 French in diameter   Post-Procedure: - Patient tolerated the procedure well   Assessment/ Plan:  Bulbar Urethral stricture  (radiation/postprocedural) - Recommend proceeding to operating room for urethral dilation with UroLume balloon. He understands he will need a Foley 24-72 hours Risk and benefits were discussed in detail today. -Risk of bleeding, infection, need for Foley catheter were discussed.  All questions were answered. -SPECT this is the source of his ongoing worsening urinary symptoms - Urine sent for pre-op culture   2.  Microscopic hematuria We will also plan for bilateral retrograde pyelogram/cystoscopy at the time of urethral dilation to complete hematuria evaluation   I,Kailey Littlejohn,acting as a scribe for Hollice Espy, MD.,have documented all relevant documentation on the behalf of Hollice Espy, MD,as directed by  Hollice Espy, MD while in the presence of Hollice Espy, MD.  I have reviewed the above documentation for accuracy and completeness, and I agree with the above.   Hollice Espy, MD

## 2021-07-10 NOTE — Progress Notes (Signed)
° °  07/11/2021 CC:  Chief Complaint  Patient presents with   Cysto     HPI: Christopher Orr is a 75 y.o. male with a personal history of of prostate cancer, BPH with LUTS, urinary frequency, ED, renal cyst, and microscopic hematuria, who presents today for a cystoscopy.   He was diagnosed with prostate cancer on 12/20/2015. Pathology revealed low risk prostate cancer with Gleason 3+3 involving 7 our of 12 cores  including all cores on left and single core at right apex up to 70% tissue.  TRUS volume 42 cc. He is s/p brachytherapy seed implant on 04/22/2016.   He underwent an MRI on 12/27/2020 for further evaluation of lesion on the right  kidney. It showed that the lesion of concern in the right kidney anteriorly has reduced complexity compared to previously, and is currently categorized as a Bosniak category 2 complex but benign cyst. Bosniak category 1 and category 2 cysts are present in the kidneys.  He has been managed for urinary frequency for many years he was on Flomax and Mybetriq 50 mg. Contributing factors include alcohol abuse.  He was just admitted for alcohol abuse a suicidal ideation.   He is doing well today.   Vitals:   07/11/21 0952  BP: 133/81  Pulse: 92  NED. A&Ox3.   No respiratory distress   Abd soft, NT, ND Normal phallus with bilateral descended testicles  Cystoscopy Procedure Note  Patient identification was confirmed, informed consent was obtained, and patient was prepped using Betadine solution.  Lidocaine jelly was administered per urethral meatus.     Pre-Procedure: - Inspection reveals a normal caliber ureteral meatus.  Procedure: The flexible cystoscope was introduced without difficulty - Urethral stricture at junction of bulbar and prostatic urethra; unable to advance scope, approximately 10 French in diameter   Post-Procedure: - Patient tolerated the procedure well   Assessment/ Plan:  Bulbar Urethral stricture  (radiation/postprocedural) - Recommend proceeding to operating room for urethral dilation with UroLume balloon. He understands he will need a Foley 24-72 hours Risk and benefits were discussed in detail today. -Risk of bleeding, infection, need for Foley catheter were discussed.  All questions were answered. -SPECT this is the source of his ongoing worsening urinary symptoms - Urine sent for pre-op culture   2.  Microscopic hematuria We will also plan for bilateral retrograde pyelogram/cystoscopy at the time of urethral dilation to complete hematuria evaluation   I,Kailey Littlejohn,acting as a scribe for Hollice Espy, MD.,have documented all relevant documentation on the behalf of Hollice Espy, MD,as directed by  Hollice Espy, MD while in the presence of Hollice Espy, MD.  I have reviewed the above documentation for accuracy and completeness, and I agree with the above.   Hollice Espy, MD

## 2021-07-11 ENCOUNTER — Other Ambulatory Visit: Payer: Self-pay

## 2021-07-11 ENCOUNTER — Ambulatory Visit: Payer: Medicare HMO | Admitting: Urology

## 2021-07-11 ENCOUNTER — Other Ambulatory Visit: Payer: Self-pay | Admitting: Urology

## 2021-07-11 VITALS — BP 133/81 | HR 92 | Ht 72.0 in | Wt 255.0 lb

## 2021-07-11 DIAGNOSIS — N35812 Other urethral bulbous stricture, male: Secondary | ICD-10-CM | POA: Diagnosis not present

## 2021-07-11 DIAGNOSIS — R3129 Other microscopic hematuria: Secondary | ICD-10-CM | POA: Diagnosis not present

## 2021-07-11 LAB — URINALYSIS, COMPLETE
Bilirubin, UA: NEGATIVE
Glucose, UA: NEGATIVE
Ketones, UA: NEGATIVE
Leukocytes,UA: NEGATIVE
Nitrite, UA: NEGATIVE
Protein,UA: NEGATIVE
Specific Gravity, UA: 1.01 (ref 1.005–1.030)
Urobilinogen, Ur: 0.2 mg/dL (ref 0.2–1.0)
pH, UA: 7 (ref 5.0–7.5)

## 2021-07-11 LAB — MICROSCOPIC EXAMINATION: Bacteria, UA: NONE SEEN

## 2021-07-11 NOTE — Progress Notes (Signed)
Surgical Physician Order Form [facility]rology [place]  * Scheduling expectation : Next Available  *Length of Case:   *Clearance needed: yes; Cardiology [place]  *Anticoagulation Instructions: Hold all anticoagulants  *Aspirin Instructions: Okay to continue aspirin, will need to hold Plavix if possible  *Post-op visit Date/Instructions:  1-3 day cath removal  *Diagnosis: Ureteral Stricture  *Procedure: bilateral  retrograde pyelogram, cystoscopy, urethral dilation with Urolume balloon   Additional orders: N/A  -Admit type: OUTpatient  -Anesthesia: General  -VTE Prophylaxis Standing Order SCDs       Other:   -Standing Lab Orders Per Anesthesia    Lab other: None  -Standing Test orders EKG/Chest x-ray per Anesthesia       Test other:   - Medications:  Ancef 2gm IV  -Other orders:  N/A

## 2021-07-11 NOTE — Patient Instructions (Signed)
Urethral Stricture Urethral stricture is narrowing of the tube (urethra) that carries urine from the bladder out of the body. The urethra can become narrow due to scar tissue from an injury or infection. This can make it difficult to pass urine. In women, the urethra opens above the vaginal opening. In men, the urethra opens at the tip of the penis, and the urethra is much longer than it is in women. Because of the length of the male urethra, urethral stricture is much more common in men. This condition is treated with surgery. What are the causes? In both men and women, common causes of urethral stricture include: Urinary tract infection (UTI). Sexually transmitted infection (STI). Use of a tube placed into the urethra to drain urine from the bladder (urinary catheter). Urinary tract surgery. In men, common causes of urethral stricture include: A severe injury to the pelvis. Prostate surgery. Injury to the penis. In many cases, the cause of urethral stricture is not known. What increases the risk? You are more likely to develop this condition if you: Are male. Men who have had prostate surgery are at risk of developing this condition. Use a urinary catheter. Have had urinary tract surgery. What are the signs or symptoms? The main symptom of this condition is difficulty passing urine. This may cause decreased urine flow, dribbling, or spraying of urine. Other symptom of this condition may include: Frequent UTIs. Blood in the urine. Pain when urinating. Swelling of the penis in men. Inability to pass urine (urinary obstruction). How is this diagnosed? This condition may be diagnosed based on: Your medical history and a physical exam. Urine tests to check for infection or bleeding. X-rays. Ultrasound. Retrograde urethrogram. This is a type of test in which dye is injected into the urethra and then an X-ray is taken. Urethroscopy. This is when a thin tube with a light and camera on the  end (urethroscope) is used to look at the urethra. How is this treated? This condition is treated with surgery. The type of surgery that you have depends on the severity of your condition. You may have: Urethral dilation. In this procedure, the narrow part of the urethra is stretched open (dilated) with dilating instruments or a small balloon. Urethrotomy. In this procedure, a urethroscope is placed into the urethra, and the narrow part of the urethra is cut open with a surgical blade inserted through the urethroscope. Open surgery. In this procedure, an incision is made in the urethra, the narrow part is removed, and the urethra is reconstructed. Follow these instructions at home:  Take over-the-counter and prescription medicines only as told by your health care provider. If you were prescribed an antibiotic medicine, take it as told by your health care provider. Do not stop taking the antibiotic even if you start to feel better. Drink enough fluid to keep your urine pale yellow. Keep all follow-up visits as told by your health care provider. This is important. Contact a health care provider if: You have signs of a urinary tract infection, such as: Frequent urination or passing small amounts of urine frequently. Needing to urinate urgently. Pain or burning with urination. Urine that smells bad or unusual. Cloudy urine. Pain in the lower abdomen or back. Trouble urinating. Blood in the urine. Vomiting or being less hungry than normal. Diarrhea or abdominal pain. Vaginal discharge, if you are male. Your symptoms are getting worse instead of better. Get help right away if: You cannot pass urine. You have a fever. You  have swelling, bruising, or discoloration of your genital area. This includes the penis, scrotum, and inner thighs for men, and the outer genital organs (vulva) and inner thighs for women. You develop swelling in your legs. You have difficulty breathing. Summary Urethral  stricture is narrowing of the tube (urethra) that carries urine from the bladder out of the body. The urethra can become narrow due to scar tissue from an injury or infection. This condition can make it difficult to pass urine. This condition is treated with surgery. The type of surgery that you have depends on the severity of your condition. Contact a health care provider if your symptoms get worse or you have signs of a urinary tract infection. This information is not intended to replace advice given to you by your health care provider. Make sure you discuss any questions you have with your health care provider. Document Revised: 02/24/2018 Document Reviewed: 02/24/2018 Elsevier Patient Education  [place].

## 2021-07-15 ENCOUNTER — Encounter: Payer: Self-pay | Admitting: Urology

## 2021-07-17 ENCOUNTER — Telehealth: Payer: Self-pay | Admitting: Internal Medicine

## 2021-07-17 ENCOUNTER — Telehealth: Payer: Self-pay | Admitting: Urgent Care

## 2021-07-17 LAB — CULTURE, URINE COMPREHENSIVE

## 2021-07-17 NOTE — Telephone Encounter (Signed)
Called to check on Patient. States he was not going to the hospital and would like a virtual. Scheduled Patient to be seen tomorrow 07/18/21 at 7:30 am with Sharyn Lull Flinchum.   Patient has been sick for 5 days and had a negative COVID 19 home test. Having head congestion/heaviness, Shortness of Breath, runny nose, and over all not feeling well. Had Patient check oxygen while on the phone and this was 93. Informed the Patient should this drop below 90 he needs to go to the emergency room right away. Patient verbalized understanding   For your information

## 2021-07-17 NOTE — Telephone Encounter (Signed)
Patient called in to confirm lab appt for tomorrow, requesting to see a doctor  because Cough , running nose headache and body aches no fever, change appetite, dizzy feeling weak xfer to Access Nurse

## 2021-07-17 NOTE — Progress Notes (Signed)
[facility] Posting Form   Surgery Date/Time: Date: 08/08/2021  Surgeon: Dr. Hollice Espy, MD  Surgery Location: Day Surgery  Inpt ( No  )   Outpt (Yes)   Obs ( No  )   Diagnosis: Urethral Stricture N35.812  -CPT: 16109, [phone]  Surgery: Cystoscopy with Urethral Dilation using Optilume and bilateral retrograde pyelograms  Stop Anticoagulations: Yes, OK to continue ASA  Cardiac/Medical/Pulmonary Clearance needed: Yes  Clearance needed from Dr: Fletcher Anon  Clearance request sent on: Date: 07/17/21, sent by Honor Loh NP with Peri-Operative services    *Orders entered into EPIC  Date: 07/17/21   *Case booked in EPIC  Date: 07/15/2021  *Notified pt of Surgery: Date: 07/15/2021  PRE-OP UA & CX: no   *Placed into Prior Authorization Work [place] Date: 07/17/21   Assistant/laser/rep:No

## 2021-07-17 NOTE — Progress Notes (Signed)
°  Perioperative Services Pre-Admission/Anesthesia Testing     Date: 07/17/21  Name: Christopher Orr MRN:   [id number]  Re: Request from surgery for clearance prior to scheduled procedure  Patient is scheduled to undergo a CYSTOSCOPY WITH URETHRAL DILATATION WITH UROLUME BALLOON; CYSTOSCOPY WITH RETROGRADE PYELOGRAM on 08/08/2021 with Dr. Hollice Espy, MD. Patient has not been scheduled for his PAT appointment at this point, thus has not undergone review by PAT RN and/or APP. Received communication from primary attending surgeon's office requesting that patient be submitted for clearance from cardiology.   PROVIDER SPECIALTY FAXED TO   Kathlyn Sacramento, MD Cardiology APP Pool   Plan:  Clearance documents generated and faxed to appropriate provider(s) as noted above. Note will be updated to reflect communication with provider's office as it relates to clearance being provided and/or the need for office visit prior to clearance for surgery being issued.   Honor Loh, MSN, APRN, FNP-C, CEN [facility]  Peri-operative Services Nurse Practitioner Phone: [phone] 07/17/21 4:39 PM  NOTE: This note has been prepared using Dragon dictation software. Despite my best ability to proofread, there is always the potential that unintentional transcriptional errors may still occur from this process.

## 2021-07-18 ENCOUNTER — Other Ambulatory Visit: Payer: Self-pay

## 2021-07-18 ENCOUNTER — Telehealth: Payer: Self-pay | Admitting: *Deleted

## 2021-07-18 ENCOUNTER — Encounter: Payer: Self-pay | Admitting: Adult Health

## 2021-07-18 ENCOUNTER — Other Ambulatory Visit: Payer: Self-pay | Admitting: Internal Medicine

## 2021-07-18 ENCOUNTER — Ambulatory Visit (INDEPENDENT_AMBULATORY_CARE_PROVIDER_SITE_OTHER): Payer: Medicare HMO | Admitting: Adult Health

## 2021-07-18 ENCOUNTER — Other Ambulatory Visit (INDEPENDENT_AMBULATORY_CARE_PROVIDER_SITE_OTHER): Payer: Medicare HMO

## 2021-07-18 VITALS — Ht 72.01 in | Wt 255.0 lb

## 2021-07-18 DIAGNOSIS — H9201 Otalgia, right ear: Secondary | ICD-10-CM

## 2021-07-18 DIAGNOSIS — D649 Anemia, unspecified: Secondary | ICD-10-CM | POA: Diagnosis not present

## 2021-07-18 DIAGNOSIS — Z13220 Encounter for screening for lipoid disorders: Secondary | ICD-10-CM | POA: Diagnosis not present

## 2021-07-18 DIAGNOSIS — J069 Acute upper respiratory infection, unspecified: Secondary | ICD-10-CM | POA: Diagnosis not present

## 2021-07-18 DIAGNOSIS — R5383 Other fatigue: Secondary | ICD-10-CM | POA: Diagnosis not present

## 2021-07-18 DIAGNOSIS — R739 Hyperglycemia, unspecified: Secondary | ICD-10-CM

## 2021-07-18 DIAGNOSIS — Z1329 Encounter for screening for other suspected endocrine disorder: Secondary | ICD-10-CM

## 2021-07-18 DIAGNOSIS — D7589 Other specified diseases of blood and blood-forming organs: Secondary | ICD-10-CM | POA: Diagnosis not present

## 2021-07-18 DIAGNOSIS — E785 Hyperlipidemia, unspecified: Secondary | ICD-10-CM

## 2021-07-18 HISTORY — DX: Otalgia, right ear: H92.01

## 2021-07-18 HISTORY — DX: Acute upper respiratory infection, unspecified: J06.9

## 2021-07-18 LAB — LIPID PANEL
Cholesterol: 98 mg/dL (ref 0–200)
HDL: 43.8 mg/dL (ref >39.00)
LDL Cholesterol: 25 mg/dL (ref 0–99)
NonHDL: 53.76
Total CHOL/HDL Ratio: 2
Triglycerides: 146 mg/dL (ref 0.0–149.0)
VLDL: 29.2 mg/dL (ref 0.0–40.0)

## 2021-07-18 LAB — IBC + FERRITIN
Ferritin: 92.1 ng/mL (ref 22.0–322.0)
Iron: 131 ug/dL (ref 42–165)
Saturation Ratios: 41.8 % (ref 20.0–50.0)
TIBC: 313.6 ug/dL (ref 250.0–450.0)
Transferrin: 224 mg/dL (ref 212.0–360.0)

## 2021-07-18 LAB — FOLATE: Folate: >24.2 ng/mL (ref >5.9)

## 2021-07-18 LAB — HEMOGLOBIN A1C: Hgb A1c MFr Bld: 5.8 % (ref 4.6–6.5)

## 2021-07-18 LAB — TSH: TSH: 0.51 u[IU]/mL (ref 0.35–5.50)

## 2021-07-18 MED ORDER — AMOXICILLIN-POT CLAVULANATE 875-125 MG PO TABS: 1.0000 | ORAL_TABLET | Freq: Two times a day (BID) | ORAL | 0 refills | Status: AC

## 2021-07-18 NOTE — Telephone Encounter (Signed)
° °  Primary Cardiologist: Kathlyn Sacramento, MD  Christopher Orr would be at acceptable risk for the planned procedure without further cardiovascular testing.   Recommendations from Dr. Fletcher Anon regarding clopidogrel and aspirin are as follows:  Hold Plavix 7 days before.  Prefer to keep him on aspirin 81 mg daily given that he had a first generation drug-eluting stent placement in 2006.  If risk of bleeding is too high, aspirin can be held 5 days before  I will route this recommendation to the requesting party via [facility] fax function and remove from pre-op pool.  Please call with questions.  Emmaline Life, NP-C    07/18/2021, 2:38 PM [place] [address], [address] Office [phone] Fax [phone]

## 2021-07-18 NOTE — Telephone Encounter (Signed)
-----   Message from Karen Kitchens, NP sent at 07/17/2021  4:38 PM EST ----- Regarding: Request for pre-operative cardiac clearance Request for pre-operative cardiac clearance:  1. What type of surgery is being performed?  CYSTOSCOPY WITH URETHRAL DILATATION WITH UROLUME BALLOON; CYSTOSCOPY WITH RETROGRADE PYELOGRAM  2. When is this surgery scheduled?  08/08/2021  3. Are there any medications that need to be held prior to surgery? ASA + CLOPIDOGREL  4. Practice name and name of physician performing surgery?  Performing surgeon: Dr. Hollice Espy, MD Requesting clearance: Honor Loh, FNP-C    5. Anesthesia type (none, local, MAC, general)? General   6. What is the office phone and fax number?   Phone: [phone] Fax: [phone]  ATTENTION: Unable to create telephone message as per your standard workflow. Directed by [facility] providers to send requests for cardiac clearance to this pool for appropriate distribution to provider covering pre-operative clearances.   Honor Loh, MSN, APRN, FNP-C, CEN [facility]  Peri-operative Services Nurse Practitioner Phone: [phone] 07/17/21 4:38 PM

## 2021-07-18 NOTE — Telephone Encounter (Signed)
1. What type of surgery is being performed?  CYSTOSCOPY WITH URETHRAL DILATATION WITH UROLUME BALLOON; CYSTOSCOPY WITH RETROGRADE PYELOGRAM   2. When is this surgery scheduled?  08/08/2021     3. Are there any medications that need to be held prior to surgery?  ASA + CLOPIDOGREL   4. Practice name and name of physician performing surgery?  Performing surgeon: Dr. Hollice Espy, MD  Requesting clearance: Honor Loh, FNP-C       5. Anesthesia type (none, local, MAC, general)? General   6. What is the office phone and fax number?    Phone: [phone]  Fax: [phone]

## 2021-07-18 NOTE — Progress Notes (Signed)
Virtual Visit via Telephone Note  I connected with Christopher Orr on 07/18/21 at  7:30 AM EST by telephone and verified that I am speaking with the correct person using two identifiers.  Location: Patient: at home  Provider: Provider: Provider's office at  [facility], [place].      I discussed the limitations, risks, security and privacy concerns of performing an evaluation and management service by telephone and the availability of in person appointments. I also discussed with the patient that there may be a patient responsible charge related to this service. The patient expressed understanding and agreed to proceed.   History of Present Illness: Patient with sinus pain, pressure, congestion for over 1 week, denies any fever. Sinus pressure.  Ear pain.  Patient  denies any fever, body aches,chills, rash, chest pain, shortness of breath, nausea, vomiting, or diarrhea.  No shortness of breath other than " normal " with COPD.   Denies any distress feels well otherwise.     Past Medical History:  Diagnosis Date   Alcohol abuse    pt states it is marijuana   Anemia    Arthritis    Bipolar disorder (HCC)    BPH (benign prostatic hypertrophy) with urinary obstruction    CAD (coronary artery disease)    a. s/p stent to LAD in 2006 at [facility], EF 60% at that time.   Cancer [facility]    prostate   CHF (congestive heart failure) (HCC)    Chronic pancreatitis (HCC)    Cirrhosis (HCC)    COPD (chronic obstructive pulmonary disease) ([place])    COVID-19    08/22/20   Dementia ([place])    early dementia   Depression    Diverticulitis    12/06/20 [facility]    Diverticulosis    Drug use    Dyspnea    ED (erectile dysfunction)    Gastritis    GERD (gastroesophageal reflux disease)    Headache    occasional migraines   Hepatitis C    treated   History of lumbar fusion    Hyperlipidemia    Hypertension    patient denies having high blood pressure   Lung cancer  ([place])    Mitral regurgitation    Pancreatitis    x 2    Pneumonia    07/06/18    Prostate cancer ([place])    with seeds   PTSD (post-traumatic stress disorder)    Rib fracture    s/p fall 03/15/19    Sinus disease    Urge incontinence     Observations/Objective:  Patient is alert and oriented and responsive to questions Engages in conversation with provider. Speaks in full sentences without any pauses without any shortness of breath or distress.    Assessment and Plan:  Viral upper respiratory tract infection - Plan: COVID-19, Flu A+B and RSV  Right ear pain  Meds ordered this encounter  Medications   amoxicillin-clavulanate (AUGMENTIN) 875-125 MG tablet    Sig: Take 1 tablet by mouth 2 (two) times daily for 10 days.    Dispense:  20 tablet    Refill:  0     Follow Up Instructions:  Red Flags discussed. The patient was given clear instructions to go to ER or return to medical center if any red flags develop, symptoms do not improve, worsen or new problems develop. They verbalized understanding.    I discussed the assessment and treatment plan with the patient. The patient was  provided an opportunity to ask questions and all were answered. The patient agreed with the plan and demonstrated an understanding of the instructions.   The patient was advised to call back or seek an in-person evaluation if the symptoms worsen or if the condition fails to improve as anticipated.  Return in about 1 week (around 07/25/2021), or if symptoms worsen or fail to improve, for at any time for any worsening symptoms, Go to Emergency room/ urgent care if worse.  Time spent 22 minutes.    Marcille Buffy, FNP

## 2021-07-18 NOTE — Telephone Encounter (Signed)
Hold Plavix 7 days before.  Prefer to keep him on aspirin 81 mg daily given that he had a first generation drug-eluting stent placement in 2006.  If risk of bleeding is too high, aspirin can be held 5 days before.

## 2021-07-18 NOTE — Telephone Encounter (Signed)
Christopher Orr 75 year old male is requesting preoperative cardiac evaluation for cystoscopy with ureteral dilation and retrograde pyelogram.  He was last seen in the clinic by Lars Mage on 07/03/2021 during that time he was asymptomatic.  His PACs were reviewed and he was continued on metoprolol.  His biggest complaint was trouble with his sleeping and his bladder.  His PMH includes alcohol abuse, cirrhosis, THC use, bipolar, CAD status post PCI in 2006, chronic pancreatitis, COPD, hepatitis C, and hypertension.  May his aspirin or Plavix be held prior to his procedure?  Thank you for your help.  Please direct response to CV DIV preop pool.  Jossie Ng. Sahan Pen NP-C    07/18/2021, 7:23 AM [place] [address] Office [phone] Fax [phone]

## 2021-07-18 NOTE — Telephone Encounter (Signed)
Pt seen virtually 12/22 at 7:30 am by [facility]. Coming to office for testing.

## 2021-07-19 LAB — COVID-19, FLU A+B AND RSV
Influenza A, NAA: DETECTED — AB
Influenza B, NAA: NOT DETECTED
RSV, NAA: NOT DETECTED
SARS-CoV-2, NAA: NOT DETECTED

## 2021-07-19 NOTE — Progress Notes (Signed)
Influenza A / flu A is positive.

## 2021-07-22 NOTE — Patient Instructions (Signed)
Upper Respiratory Infection, Adult An upper respiratory infection (URI) affects the nose, throat, and upper airways that lead to the lungs. The most common type of URI is often called the common cold. URIs usually get better on their own, without medical treatment. What are the causes? A URI is caused by a germ (virus). You may catch these germs by: Breathing in droplets from an infected person's cough or sneeze. Touching something that has the germ on it (is contaminated) and then touching your mouth, nose, or eyes. What increases the risk? You are more likely to get a URI if: You are very young or very old. You have close contact with others, such as at work, school, or a health care facility. You smoke. You have long-term (chronic) heart or lung disease. You have a weakened disease-fighting system (immune system). You have nasal allergies or asthma. You have a lot of stress. You have poor nutrition. What are the signs or symptoms? Runny or stuffy (congested) nose. Cough. Sneezing. Sore throat. Headache. Feeling tired (fatigue). Fever. Not wanting to eat as much as usual. Pain in your forehead, behind your eyes, and over your cheekbones (sinus pain). Muscle aches. Redness or irritation of the eyes. Pressure in the ears or face. How is this treated? URIs usually get better on their own within 7-10 days. Medicines cannot cure URIs, but your doctor may recommend certain medicines to help relieve symptoms, such as: Over-the-counter cold medicines. Medicines to reduce coughing (cough suppressants). Coughing is a type of defense against infection that helps to clear the nose, throat, windpipe, and lungs (respiratory system). Take these medicines only as told by your doctor. Medicines to lower your fever. Follow these instructions at home: Activity Rest as needed. If you have a fever, stay home from work or school until your fever is gone, or until your doctor says you may return to  work or school. You should stay home until you cannot spread the infection anymore (you are not contagious). Your doctor may have you wear a face mask so you have less risk of spreading the infection. Relieving symptoms Rinse your mouth often with salt water. To make salt water, dissolve -1 tsp (3-6 g) of salt in 1 cup (237 mL) of warm water. Use a cool-mist humidifier to add moisture to the air. This can help you breathe more easily. Eating and drinking  Drink enough fluid to keep your pee (urine) pale yellow. Eat soups and other clear broths. General instructions  Take over-the-counter and prescription medicines only as told by your doctor. Do not smoke or use any products that contain nicotine or tobacco. If you need help quitting, ask your doctor. Avoid being where people are smoking (avoid secondhand smoke). Stay up to date on all your shots (immunizations), and get the flu shot every year. Keep all follow-up visits. How to prevent the spread of infection to others  Wash your hands with soap and water for at least 20 seconds. If you cannot use soap and water, use hand sanitizer. Avoid touching your mouth, face, eyes, or nose. Cough or sneeze into a tissue or your sleeve or elbow. Do not cough or sneeze into your hand or into the air. Contact a doctor if: You are getting worse, not better. You have any of these: A fever or chills. Brown or red mucus in your nose. Yellow or brown fluid (discharge)coming from your nose. Pain in your face, especially when you bend forward. Swollen neck glands. Pain when you swallow.  White areas in the back of your throat. Get help right away if: You have shortness of breath that gets worse. You have very bad or constant: Headache. Ear pain. Pain in your forehead, behind your eyes, and over your cheekbones (sinus pain). Chest pain. You have long-lasting (chronic) lung disease along with any of these: Making high-pitched whistling sounds when  you breathe, most often when you breathe out (wheezing). Long-lasting cough (more than 14 days). Coughing up blood. A change in your usual mucus. You have a stiff neck. You have changes in your: Vision. Hearing. Thinking. Mood. These symptoms may be an emergency. Get help right away. Call 911. Do not wait to see if the symptoms will go away. Do not drive yourself to the hospital. Summary An upper respiratory infection (URI) is caused by a germ (virus). The most common type of URI is often called the common cold. URIs usually get better within 7-10 days. Take over-the-counter and prescription medicines only as told by your doctor. This information is not intended to replace advice given to you by your health care provider. Make sure you discuss any questions you have with your health care provider. Document Revised: 02/13/2021 Document Reviewed: 02/13/2021 Elsevier Patient Education  [place]. Otitis Media, Adult Otitis media is a condition in which the middle ear is red and swollen (inflamed) and full of fluid. The middle ear is the part of the ear that contains bones for hearing as well as air that helps send sounds to the brain. The condition usually goes away on its own. What are the causes? This condition is caused by a blockage in the eustachian tube. This tube connects the middle ear to the back of the nose. It normally allows air into the middle ear. The blockage is caused by fluid or swelling. Problems that can cause blockage include: A cold or infection that affects the nose, mouth, or throat. Allergies. An irritant, such as tobacco smoke. Adenoids that have become large. The adenoids are soft tissue located in the back of the throat, behind the nose and the roof of the mouth. Growth or swelling in the upper part of the throat, just behind the nose (nasopharynx). Damage to the ear caused by a change in pressure. This is called barotrauma. What increases the risk? You  are more likely to develop this condition if you: Smoke or are exposed to tobacco smoke. Have an opening in the roof of your mouth (cleft palate). Have acid reflux. Have problems in your body's defense system (immune system). What are the signs or symptoms? Symptoms of this condition include: Ear pain. Fever. Problems with hearing. Being tired. Fluid leaking from the ear. Ringing in the ear. How is this treated? This condition can go away on its own within 3-5 days. But if the condition is caused by germs (bacteria) and does not go away on its own, or if it keeps coming back, your doctor may: Give you antibiotic medicines. Give you medicines for pain. Follow these instructions at home: Take over-the-counter and prescription medicines only as told by your doctor. If you were prescribed an antibiotic medicine, take it as told by your doctor. Do not stop taking it even if you start to feel better. Keep all follow-up visits. Contact a doctor if: You have bleeding from your nose. There is a lump on your neck. You are not feeling better in 5 days. You feel worse instead of better. Get help right away if: You have pain that  is not helped with medicine. You have swelling, redness, or pain around your ear. You get a stiff neck. You cannot move part of your face (paralysis). You notice that the bone behind your ear hurts when you touch it. You get a very bad headache. Summary Otitis media means that the middle ear is red, swollen, and full of fluid. This condition usually goes away on its own. If the problem does not go away, treatment may be needed. You may be given medicines to treat the infection or to treat your pain. If you were prescribed an antibiotic medicine, take it as told by your doctor. Do not stop taking it even if you start to feel better. Keep all follow-up visits. This information is not intended to replace advice given to you by your health care provider. Make sure you  discuss any questions you have with your health care provider. Document Revised: 10/22/2020 Document Reviewed: 10/22/2020 Elsevier Patient Education  [place].

## 2021-07-24 ENCOUNTER — Other Ambulatory Visit: Payer: Self-pay

## 2021-07-24 ENCOUNTER — Ambulatory Visit (INDEPENDENT_AMBULATORY_CARE_PROVIDER_SITE_OTHER): Payer: Medicare HMO

## 2021-07-24 DIAGNOSIS — I739 Peripheral vascular disease, unspecified: Secondary | ICD-10-CM

## 2021-07-25 ENCOUNTER — Other Ambulatory Visit
Admission: RE | Admit: 2021-07-25 | Discharge: 2021-07-25 | Disposition: A | Discharge status: Home / Self Care | Payer: Medicare HMO | Source: Ambulatory Visit | Attending: Urology | Admitting: Urology

## 2021-07-25 NOTE — Patient Instructions (Addendum)
Your procedure is scheduled on: Thursday Januray 12, 2023 Report to Day Surgery inside [facility] 2nd floor.  To find out your arrival time please call [phone] between 1PM - 3PM on Wednesday August 07, 2020.  Remember: Instructions that are not followed completely may result in serious medical risk,  up to and including death, or upon the discretion of your surgeon and anesthesiologist your  surgery may need to be rescheduled.     _X__ 1. Do not eat food or drink fluids after midnight the night before your procedure.                 No chewing gum or hard candies.   __X__2.  On the morning of surgery brush your teeth with toothpaste and water, you                may rinse your mouth with mouthwash if you wish.  Do not swallow any toothpaste or mouthwash.     _X__ 3.  No Alcohol for 24 hours before or after surgery.   _X__ 4.  Do Not Smoke or use e-cigarettes For 24 Hours Prior to Your Surgery.                 Do not use any chewable tobacco products for at least 6 hours prior to                 Surgery.  _X__  5.  Do not use any recreational drugs (marijuana, cocaine, heroin, ecstasy, MDMA or other)                For at least one week prior to your surgery.  Combination of these drugs with anesthesia                May have life threatening results.  __X__6.  Notify your doctor if there is any change in your medical condition      (cold, fever, infections).     Do not wear jewelry, make-up, hairpins, clips or nail polish. Do not wear lotions, powders, or perfumes. You may wear deodorant. Do not shave 48 hours prior to surgery. Men may shave face and neck. Do not bring valuables to the hospital.    [facility] is not responsible for any belongings or valuables.  Contacts, dentures or bridgework may not be worn into surgery. Leave your suitcase in the car. After surgery it may be brought to your room. For patients admitted to the hospital, discharge  time is determined by your treatment team.   Patients discharged the day of surgery will not be allowed to drive home.   Make arrangements for someone to be with you for the first 24 hours of your Same Day Discharge.   __X__ Take these medicines the morning of surgery with A SIP OF WATER:    1. ARIPiprazole (ABILIFY) 10 MG  2. metoprolol succinate (TOPROL-XL) 50 MG 24 hr tablet  3. FLUoxetine (PROZAC) 40 MG   4. pantoprazole (PROTONIX) 40 MG   5. memantine (NAMENDA) 5 MG  6.  ____ Fleet Enema (as directed)   ____ Use CHG Soap (or wipes) as directed  ____ Use Benzoyl Peroxide Gel as instructed  __X__ Use inhalers on the day of surgery  budesonide-formoterol (SYMBICORT) 160-4.5 MCG/ACT inhaler  ____ Stop metformin 2 days prior to surgery    ____ Take 1/2 of usual insulin dose the night before surgery. No insulin the morning  of surgery.   __X__ Stop clopidogrel (PLAVIX) 75 MG 1 week piror to surgery as instructed by your doctor. (Last dose will be 08/01/21)  __X__ One Week prior to surgery- Stop Anti-inflammatories such as Ibuprofen, Aleve, Advil, Motrin, meloxicam (MOBIC), diclofenac, etodolac, ketorolac, Toradol, Daypro, piroxicam, Goody's or BC powders. OK TO USE TYLENOL IF NEEDED   __X__ Do not start any new supplements until after surgery.    ____ Bring C-Pap to the hospital.    If you have any questions regarding your pre-procedure instructions,  Please call Pre-admit Testing at [phone]

## 2021-07-31 ENCOUNTER — Other Ambulatory Visit: Payer: Self-pay | Admitting: Internal Medicine

## 2021-07-31 DIAGNOSIS — E785 Hyperlipidemia, unspecified: Secondary | ICD-10-CM

## 2021-08-01 NOTE — Telephone Encounter (Signed)
error 

## 2021-08-05 NOTE — Pre-Procedure Instructions (Signed)
Cardiac clearance on chart from Dr Fletcher Anon Christen Bame PA)-Acceptable Risk

## 2021-08-08 ENCOUNTER — Emergency Department
Admission: EM | Admit: 2021-08-08 | Discharge: 2021-08-08 | Disposition: A | Discharge status: Home / Self Care | Payer: Medicare HMO | Source: Home / Self Care | Attending: Emergency Medicine | Admitting: Emergency Medicine

## 2021-08-08 ENCOUNTER — Encounter: Payer: Self-pay | Admitting: Emergency Medicine

## 2021-08-08 ENCOUNTER — Ambulatory Visit: Payer: Medicare HMO | Admitting: Urgent Care

## 2021-08-08 ENCOUNTER — Ambulatory Visit
Admission: RE | Admit: 2021-08-08 | Discharge: 2021-08-08 | Disposition: A | Discharge status: Home / Self Care | Payer: Medicare HMO | Attending: Urology | Admitting: Urology

## 2021-08-08 ENCOUNTER — Ambulatory Visit: Payer: Medicare HMO

## 2021-08-08 ENCOUNTER — Telehealth: Payer: Self-pay | Admitting: Internal Medicine

## 2021-08-08 ENCOUNTER — Encounter
Admission: RE | Disposition: A | Discharge status: Home / Self Care | Payer: Self-pay | Source: Home / Self Care | Attending: Urology

## 2021-08-08 ENCOUNTER — Other Ambulatory Visit: Payer: Self-pay

## 2021-08-08 ENCOUNTER — Encounter: Payer: Self-pay | Admitting: Urology

## 2021-08-08 ENCOUNTER — Emergency Department: Payer: Medicare HMO

## 2021-08-08 DIAGNOSIS — Z7902 Long term (current) use of antithrombotics/antiplatelets: Secondary | ICD-10-CM | POA: Insufficient documentation

## 2021-08-08 DIAGNOSIS — N35912 Unspecified bulbous urethral stricture, male: Secondary | ICD-10-CM | POA: Insufficient documentation

## 2021-08-08 DIAGNOSIS — N35812 Other urethral bulbous stricture, male: Secondary | ICD-10-CM

## 2021-08-08 DIAGNOSIS — R3129 Other microscopic hematuria: Secondary | ICD-10-CM | POA: Insufficient documentation

## 2021-08-08 DIAGNOSIS — K219 Gastro-esophageal reflux disease without esophagitis: Secondary | ICD-10-CM | POA: Diagnosis not present

## 2021-08-08 DIAGNOSIS — N401 Enlarged prostate with lower urinary tract symptoms: Secondary | ICD-10-CM | POA: Insufficient documentation

## 2021-08-08 DIAGNOSIS — N1330 Unspecified hydronephrosis: Secondary | ICD-10-CM | POA: Diagnosis not present

## 2021-08-08 DIAGNOSIS — I2510 Atherosclerotic heart disease of native coronary artery without angina pectoris: Secondary | ICD-10-CM | POA: Insufficient documentation

## 2021-08-08 DIAGNOSIS — Z902 Acquired absence of lung [part of]: Secondary | ICD-10-CM | POA: Diagnosis not present

## 2021-08-08 DIAGNOSIS — I10 Essential (primary) hypertension: Secondary | ICD-10-CM | POA: Insufficient documentation

## 2021-08-08 DIAGNOSIS — N3941 Urge incontinence: Secondary | ICD-10-CM | POA: Insufficient documentation

## 2021-08-08 DIAGNOSIS — R3915 Urgency of urination: Secondary | ICD-10-CM | POA: Diagnosis not present

## 2021-08-08 DIAGNOSIS — Z978 Presence of other specified devices: Secondary | ICD-10-CM

## 2021-08-08 DIAGNOSIS — I700 Atherosclerosis of aorta: Secondary | ICD-10-CM | POA: Diagnosis not present

## 2021-08-08 DIAGNOSIS — Z6834 Body mass index (BMI) 34.0-34.9, adult: Secondary | ICD-10-CM | POA: Diagnosis not present

## 2021-08-08 DIAGNOSIS — Z85118 Personal history of other malignant neoplasm of bronchus and lung: Secondary | ICD-10-CM | POA: Diagnosis not present

## 2021-08-08 DIAGNOSIS — I509 Heart failure, unspecified: Secondary | ICD-10-CM | POA: Insufficient documentation

## 2021-08-08 DIAGNOSIS — Z8546 Personal history of malignant neoplasm of prostate: Secondary | ICD-10-CM | POA: Diagnosis not present

## 2021-08-08 DIAGNOSIS — J449 Chronic obstructive pulmonary disease, unspecified: Secondary | ICD-10-CM | POA: Insufficient documentation

## 2021-08-08 DIAGNOSIS — Z955 Presence of coronary angioplasty implant and graft: Secondary | ICD-10-CM | POA: Insufficient documentation

## 2021-08-08 DIAGNOSIS — R1084 Generalized abdominal pain: Secondary | ICD-10-CM | POA: Insufficient documentation

## 2021-08-08 DIAGNOSIS — N35919 Unspecified urethral stricture, male, unspecified site: Secondary | ICD-10-CM | POA: Diagnosis not present

## 2021-08-08 DIAGNOSIS — F17200 Nicotine dependence, unspecified, uncomplicated: Secondary | ICD-10-CM | POA: Insufficient documentation

## 2021-08-08 DIAGNOSIS — R1030 Lower abdominal pain, unspecified: Secondary | ICD-10-CM | POA: Diagnosis not present

## 2021-08-08 DIAGNOSIS — Z923 Personal history of irradiation: Secondary | ICD-10-CM | POA: Insufficient documentation

## 2021-08-08 DIAGNOSIS — N281 Cyst of kidney, acquired: Secondary | ICD-10-CM | POA: Diagnosis not present

## 2021-08-08 HISTORY — PX: CYSTOSCOPY W/ RETROGRADES: SHX1426

## 2021-08-08 HISTORY — PX: CYSTOSCOPY WITH URETHRAL DILATATION: SHX5125

## 2021-08-08 LAB — URINALYSIS, ROUTINE W REFLEX MICROSCOPIC
Bilirubin Urine: NEGATIVE
Glucose, UA: NEGATIVE mg/dL
Ketones, ur: 5 mg/dL — AB
Nitrite: NEGATIVE
Protein, ur: 100 mg/dL — AB
RBC / HPF: >50 RBC/hpf — ABNORMAL HIGH (ref 0–5)
Specific Gravity, Urine: 1.023 (ref 1.005–1.030)
Squamous Epithelial / HPF: NONE SEEN (ref 0–5)
WBC, UA: >50 WBC/hpf — ABNORMAL HIGH (ref 0–5)
pH: 5 (ref 5.0–8.0)

## 2021-08-08 LAB — COMPREHENSIVE METABOLIC PANEL
ALT: 19 U/L (ref 0–44)
AST: 23 U/L (ref 15–41)
Albumin: 4.2 g/dL (ref 3.5–5.0)
Alkaline Phosphatase: 88 U/L (ref 38–126)
Anion gap: 7 (ref 5–15)
BUN: 18 mg/dL (ref 8–23)
CO2: 23 mmol/L (ref 22–32)
Calcium: 9 mg/dL (ref 8.9–10.3)
Chloride: 104 mmol/L (ref 98–111)
Creatinine, Ser: 1.12 mg/dL (ref 0.61–1.24)
GFR, Estimated: >60 mL/min (ref >60)
Glucose, Bld: 143 mg/dL — ABNORMAL HIGH (ref 70–99)
Potassium: 4 mmol/L (ref 3.5–5.1)
Sodium: 134 mmol/L — ABNORMAL LOW (ref 135–145)
Total Bilirubin: 0.4 mg/dL (ref 0.3–1.2)
Total Protein: 7.8 g/dL (ref 6.5–8.1)

## 2021-08-08 LAB — CBC
HCT: 37.8 % — ABNORMAL LOW (ref 39.0–52.0)
Hemoglobin: 12.4 g/dL — ABNORMAL LOW (ref 13.0–17.0)
MCH: 32.6 pg (ref 26.0–34.0)
MCHC: 32.8 g/dL (ref 30.0–36.0)
MCV: 99.5 fL (ref 80.0–100.0)
Platelets: 247 10*3/uL (ref 150–400)
RBC: 3.8 MIL/uL — ABNORMAL LOW (ref 4.22–5.81)
RDW: 13.2 % (ref 11.5–15.5)
WBC: 16.5 10*3/uL — ABNORMAL HIGH (ref 4.0–10.5)
nRBC: 0 % (ref 0.0–0.2)

## 2021-08-08 LAB — LIPASE, BLOOD: Lipase: 35 U/L (ref 11–51)

## 2021-08-08 SURGERY — CYSTOSCOPY, WITH URETHRAL DILATION
Anesthesia: General

## 2021-08-08 MED ORDER — ONDANSETRON HCL 4 MG/2ML IJ SOLN
INTRAMUSCULAR | Status: AC
  Filled 2021-08-08: qty 2

## 2021-08-08 MED ORDER — ONDANSETRON HCL 4 MG/2ML IJ SOLN
4.0000 mg | Freq: Once | INTRAMUSCULAR | Status: AC | PRN
  Administered 2021-08-08: 4 mg via INTRAVENOUS
  Filled 2021-08-08: qty 2

## 2021-08-08 MED ORDER — DEXAMETHASONE SODIUM PHOSPHATE 10 MG/ML IJ SOLN
INTRAMUSCULAR | Status: AC
  Filled 2021-08-08: qty 1

## 2021-08-08 MED ORDER — ORAL CARE MOUTH RINSE: 15.0000 mL | Freq: Once | OROMUCOSAL | Status: AC

## 2021-08-08 MED ORDER — CEFAZOLIN SODIUM-DEXTROSE 2-4 GM/100ML-% IV SOLN
INTRAVENOUS | Status: AC
  Filled 2021-08-08: qty 100

## 2021-08-08 MED ORDER — ONDANSETRON HCL 4 MG/2ML IJ SOLN: 4.0000 mg | Freq: Once | INTRAMUSCULAR | Status: DC | PRN

## 2021-08-08 MED ORDER — OXYCODONE-ACETAMINOPHEN 5-325 MG PO TABS
1.0000 | ORAL_TABLET | Freq: Once | ORAL | Status: AC
  Administered 2021-08-08: 1 via ORAL
  Filled 2021-08-08: qty 1

## 2021-08-08 MED ORDER — FENTANYL CITRATE (PF) 100 MCG/2ML IJ SOLN
INTRAMUSCULAR | Status: DC | PRN
  Administered 2021-08-08: 50 ug via INTRAVENOUS
  Administered 2021-08-08 (×2): 25 ug via INTRAVENOUS

## 2021-08-08 MED ORDER — CEFAZOLIN SODIUM-DEXTROSE 2-4 GM/100ML-% IV SOLN
2.0000 g | INTRAVENOUS | Status: AC
  Administered 2021-08-08: 2 g via INTRAVENOUS

## 2021-08-08 MED ORDER — ACETAMINOPHEN 10 MG/ML IV SOLN: 1000.0000 mg | Freq: Once | INTRAVENOUS | Status: DC | PRN

## 2021-08-08 MED ORDER — LACTATED RINGERS IV BOLUS
1000.0000 mL | Freq: Once | INTRAVENOUS | Status: AC
  Administered 2021-08-08: 1000 mL via INTRAVENOUS

## 2021-08-08 MED ORDER — OXYCODONE HCL 5 MG PO TABS: 5.0000 mg | ORAL_TABLET | Freq: Once | ORAL | Status: DC | PRN

## 2021-08-08 MED ORDER — CHLORHEXIDINE GLUCONATE 0.12 % MT SOLN
15.0000 mL | Freq: Once | OROMUCOSAL | Status: AC
  Administered 2021-08-08: 15 mL via OROMUCOSAL

## 2021-08-08 MED ORDER — LACTATED RINGERS IV SOLN: INTRAVENOUS | Status: DC

## 2021-08-08 MED ORDER — PROPOFOL 10 MG/ML IV BOLUS
INTRAVENOUS | Status: AC
  Filled 2021-08-08: qty 20

## 2021-08-08 MED ORDER — SODIUM CHLORIDE 0.9 % IR SOLN
Status: DC | PRN
  Administered 2021-08-08: 1000 mL via INTRAVESICAL

## 2021-08-08 MED ORDER — PHENYLEPHRINE HCL (PRESSORS) 10 MG/ML IV SOLN
INTRAVENOUS | Status: DC | PRN
  Administered 2021-08-08 (×2): 160 ug via INTRAVENOUS

## 2021-08-08 MED ORDER — IOHEXOL 300 MG/ML  SOLN
100.0000 mL | Freq: Once | INTRAMUSCULAR | Status: AC | PRN
  Administered 2021-08-08: 100 mL via INTRAVENOUS

## 2021-08-08 MED ORDER — PROPOFOL 10 MG/ML IV BOLUS
INTRAVENOUS | Status: DC | PRN
  Administered 2021-08-08: 20 mg via INTRAVENOUS
  Administered 2021-08-08: 180 mg via INTRAVENOUS
  Administered 2021-08-08 (×2): 100 mg via INTRAVENOUS

## 2021-08-08 MED ORDER — HYDROCODONE-ACETAMINOPHEN 5-325 MG PO TABS
1.0000 | ORAL_TABLET | Freq: Four times a day (QID) | ORAL | 0 refills | Status: DC | PRN
Start: 2021-08-08 — End: 2021-09-18

## 2021-08-08 MED ORDER — OXYCODONE HCL 5 MG/5ML PO SOLN: 5.0000 mg | Freq: Once | ORAL | Status: DC | PRN

## 2021-08-08 MED ORDER — IPRATROPIUM-ALBUTEROL 0.5-2.5 (3) MG/3ML IN SOLN
RESPIRATORY_TRACT | Status: AC
  Filled 2021-08-08: qty 3

## 2021-08-08 MED ORDER — CHLORHEXIDINE GLUCONATE 0.12 % MT SOLN
OROMUCOSAL | Status: AC
  Filled 2021-08-08: qty 15

## 2021-08-08 MED ORDER — LIDOCAINE HCL (PF) 2 % IJ SOLN
INTRAMUSCULAR | Status: AC
  Filled 2021-08-08: qty 5

## 2021-08-08 MED ORDER — IPRATROPIUM-ALBUTEROL 0.5-2.5 (3) MG/3ML IN SOLN
3.0000 mL | Freq: Once | RESPIRATORY_TRACT | Status: AC
  Administered 2021-08-08: 3 mL via RESPIRATORY_TRACT

## 2021-08-08 MED ORDER — DEXAMETHASONE SODIUM PHOSPHATE 10 MG/ML IJ SOLN
INTRAMUSCULAR | Status: DC | PRN
  Administered 2021-08-08: 10 mg via INTRAVENOUS

## 2021-08-08 MED ORDER — SODIUM CHLORIDE (PF) 0.9 % IJ SOLN
INTRAMUSCULAR | Status: AC
  Filled 2021-08-08: qty 10

## 2021-08-08 MED ORDER — FENTANYL CITRATE (PF) 100 MCG/2ML IJ SOLN
INTRAMUSCULAR | Status: AC
  Filled 2021-08-08: qty 2

## 2021-08-08 MED ORDER — FENTANYL CITRATE (PF) 100 MCG/2ML IJ SOLN: 25.0000 ug | INTRAMUSCULAR | Status: DC | PRN

## 2021-08-08 MED ORDER — LIDOCAINE HCL (CARDIAC) PF 100 MG/5ML IV SOSY
PREFILLED_SYRINGE | INTRAVENOUS | Status: DC | PRN
  Administered 2021-08-08: 100 mg via INTRAVENOUS

## 2021-08-08 MED ORDER — MORPHINE SULFATE (PF) 4 MG/ML IV SOLN
4.0000 mg | Freq: Once | INTRAVENOUS | Status: AC
  Administered 2021-08-08: 4 mg via INTRAVENOUS
  Filled 2021-08-08: qty 1

## 2021-08-08 SURGICAL SUPPLY — 32 items
BAG DRAIN CYSTO-URO LG1000N (MISCELLANEOUS) ×2 IMPLANT
BAG DRN RND TRDRP ANRFLXCHMBR (UROLOGICAL SUPPLIES) ×1
BAG URINE DRAIN 2000ML AR STRL (UROLOGICAL SUPPLIES) ×2 IMPLANT
BALLN OPTILUME DCB 24X5X75 (BALLOONS)
BALLN OPTILUME DCB 30X3X75 (BALLOONS)
BALLN OPTILUME DCB 30X5X75 (BALLOONS) ×2
BALLOON OPTILUME DCB 24X5X75 (BALLOONS) IMPLANT
BALLOON OPTILUME DCB 30X3X75 (BALLOONS) IMPLANT
BALLOON OPTILUME DCB 30X5X75 (BALLOONS) IMPLANT
BRUSH SCRUB EZ 1% IODOPHOR (MISCELLANEOUS) ×2 IMPLANT
CATH FOL 2WAY LX 16X5 (CATHETERS) IMPLANT
CATH FOL 2WAY LX 18X30 (CATHETERS) IMPLANT
CATH URETHRAL DIL 7.0X29 (CATHETERS) ×2 IMPLANT
CATH URETL OPEN 5X70 (CATHETERS) ×2 IMPLANT
DRAPE UTILITY 15X26 TOWEL STRL (DRAPES) ×2 IMPLANT
ELECT REM PT RETURN 9FT ADLT (ELECTROSURGICAL)
ELECTRODE REM PT RTRN 9FT ADLT (ELECTROSURGICAL) IMPLANT
GAUZE 4X4 16PLY ~~LOC~~+RFID DBL (SPONGE) ×4 IMPLANT
GLOVE SURG ENC MOIS LTX SZ6.5 (GLOVE) ×2 IMPLANT
GOWN STRL REUS W/ TWL LRG LVL3 (GOWN DISPOSABLE) ×2 IMPLANT
GOWN STRL REUS W/TWL LRG LVL3 (GOWN DISPOSABLE) ×4
GUIDEWIRE STR DUAL SENSOR (WIRE) ×2 IMPLANT
IV NS IRRIG 3000ML ARTHROMATIC (IV SOLUTION) ×2 IMPLANT
KIT TURNOVER CYSTO (KITS) ×2 IMPLANT
MANIFOLD NEPTUNE II (INSTRUMENTS) ×2 IMPLANT
PACK CYSTO AR (MISCELLANEOUS) ×2 IMPLANT
SET CYSTO W/LG BORE CLAMP LF (SET/KITS/TRAYS/PACK) ×2 IMPLANT
SURGILUBE 2OZ TUBE FLIPTOP (MISCELLANEOUS) ×2 IMPLANT
SYR 10ML LL (SYRINGE) ×2 IMPLANT
SYR 30ML LL (SYRINGE) ×2 IMPLANT
WATER STERILE IRR 1000ML POUR (IV SOLUTION) ×2 IMPLANT
WATER STERILE IRR 500ML POUR (IV SOLUTION) ×2 IMPLANT

## 2021-08-08 NOTE — Discharge Instructions (Signed)
AMBULATORY SURGERY  ?DISCHARGE INSTRUCTIONS ? ? ?The drugs that you were given will stay in your system until tomorrow so for the next 24 hours you should not: ? ?Drive an automobile ?Make any legal decisions ?Drink any alcoholic beverage ? ? ?You may resume regular meals tomorrow.  Today it is better to start with liquids and gradually work up to solid foods. ? ?You may eat anything you prefer, but it is better to start with liquids, then soup and crackers, and gradually work up to solid foods. ? ? ?Please notify your doctor immediately if you have any unusual bleeding, trouble breathing, redness and pain at the surgery site, drainage, fever, or pain not relieved by medication. ? ? ? ?Additional Instructions: ? ? ? ?Please contact your physician with any problems or Same Day Surgery at [phone], Monday through Friday 6 am to 4 pm, or [place] [facility] number at [phone].  ?

## 2021-08-08 NOTE — Telephone Encounter (Signed)
Patient called and stated he is having severe stomach pain and some weakness.He stated that he had a procedure at the hospital and is not sure if that is this the issue.  Patient was transferred to Access Nurse.

## 2021-08-08 NOTE — Telephone Encounter (Signed)
Patient was informed to go to the ED right away. Patient's chart shows that he is currently at the hospital.

## 2021-08-08 NOTE — Anesthesia Postprocedure Evaluation (Signed)
Anesthesia Post Note  Patient: VIGGO PERKO  Procedure(s) Performed: CYSTOSCOPY WITH URETHRAL DILATATION WITH UROLUME BALLOON CYSTOSCOPY WITH RETROGRADE PYELOGRAM  Patient location during evaluation: PACU Anesthesia Type: General Level of consciousness: awake and alert, oriented and patient cooperative Pain management: pain level controlled Vital Signs Assessment: post-procedure vital signs reviewed and stable Respiratory status: spontaneous breathing, nonlabored ventilation and respiratory function stable Cardiovascular status: blood pressure returned to baseline and stable Postop Assessment: adequate PO intake Anesthetic complications: no   No notable events documented.   Last Vitals:  Vitals:   08/08/21 0915 08/08/21 0927  BP: 119/79 135/85  Pulse: 69 66  Resp: 18 14  Temp: (!) 36.1 C (!) 36.1 C  SpO2: 95% 93%    Last Pain:  Vitals:   08/08/21 0927  TempSrc: Temporal  PainSc: 0-No pain                 Darrin Nipper

## 2021-08-08 NOTE — Progress Notes (Signed)
Irrigated foley. No clots

## 2021-08-08 NOTE — Anesthesia Procedure Notes (Signed)
Procedure Name: Intubation Date/Time: 08/08/2021 7:53 AM Performed by: Loletha Grayer, CRNA Pre-anesthesia Checklist: Patient identified, Patient being monitored, Timeout performed, Emergency Drugs available and Suction available Patient Re-evaluated:Patient Re-evaluated prior to induction Oxygen Delivery Method: Circle system utilized Preoxygenation: Pre-oxygenation with 100% oxygen Induction Type: IV induction Ventilation: Mask ventilation without difficulty Laryngoscope Size: Mac and 3 Grade View: Grade II Tube type: Oral Tube size: 7.5 mm Number of attempts: 1 Airway Equipment and Method: Stylet Placement Confirmation: ETT inserted through vocal cords under direct vision, positive ETCO2 and breath sounds checked- equal and bilateral Secured at: 22 cm Tube secured with: Tape Dental Injury: Teeth and Oropharynx as per pre-operative assessment

## 2021-08-08 NOTE — Interval H&P Note (Signed)
H&P up-to-date  RRR CTA B All questions answered

## 2021-08-08 NOTE — Op Note (Signed)
Date of procedure: 08/08/21  Preoperative diagnosis:  Bulbar urethral stricture, postprocedural History of prostate cancer status post brachytherapy Severe urinary urgency and urge incontinence  Postoperative diagnosis:  Same as above  Procedure: Cystoscopy Bilateral retrograde pyelogram Dilation of urethral stricture via Optilume balloon Foley catheter placement  Surgeon: Hollice Espy, MD  Anesthesia: General  Complications: None  Intraoperative findings: Tight approximately 8 French bulbar urethral stricture through which an open-ended ureteral catheter was able to be passed in bluntly dilated with the cystoscope, relatively soft and short.  Trabeculated bladder.  Unremarkable bilateral retrograde pyelogram.  EBL: Minimal  Specimens: None  Drains: 18 French Foley catheter with 30 cc balloon  Indication: Christopher Orr is a 76 y.o. patient with severe urinary symptoms found to have a bulbar urethral stricture.  After reviewing the management options for treatment, he elected to proceed with the above surgical procedure(s). We have discussed the potential benefits and risks of the procedure, side effects of the proposed treatment, the likelihood of the patient achieving the goals of the procedure, and any potential problems that might occur during the procedure or recuperation. Informed consent has been obtained.  Description of procedure:  The patient was taken to the operating room and general anesthesia was induced.  The patient was placed in the dorsal lithotomy position, prepped and draped in the usual sterile fashion, and preoperative antibiotics were administered. A preoperative time-out was performed.   A 21 French cystoscope was advanced per urethra to the bulbar area of the urethra where a fairly tight urethral stricture was identified.  I approximated this stricture around 8 French in diameter.  Under direct visualization, I advanced a 5 [place] open-ended ureteral  catheter through the stricture and was able to easily bluntly dilate through the strictured area through the prostate and up into the bladder.  Notably, the stricture was relatively soft, nontense and short.  Upon entering the bladder, is noted to be moderately trabeculated.  There is no bladder lesions or stones identified.  Attention was turned to the right ureteral orifice which was cannulated using a 5 [place] open-ended ureteral catheter.  Gentle retrograde pyelogram on the side showed a mildly tortuous distal ureter but otherwise no hydroureteronephrosis or filling defects.  The same procedure was formed on the left which is also unremarkable without hydronephrosis or filling defects.  These drained relatively promptly.  A wire was then placed into the level of the bladder.  I removed the scope.  I advanced a OPTi lumen balloon across the bulbar urethral stricture traversing part of the prostate as well as distal to the location of the stricture.  I used a 30 French 50 mm balloon for this purpose.  It was inflated to 10 atm using Conray to directly visualize this.  It was left in place for 5 minutes.  Ended 5 minutes, was deflated and removed.  I was able to advance an 52 [place] without a guide easily into the bladder.  The balloon was filled with 30 cc of sterile water.  The patient was then cleaned and dried, repositioned to supine position, reversed of anesthesia, and taken to PACU in stable condition.  There were no complications in this case.  Plan: We will remove his catheter on Tuesday next week and monitor his urinary symptoms.  Hollice Espy, M.D.

## 2021-08-08 NOTE — Transfer of Care (Signed)
Immediate Anesthesia Transfer of Care Note  Patient: Christopher Orr  Procedure(s) Performed: CYSTOSCOPY WITH URETHRAL DILATATION WITH UROLUME BALLOON CYSTOSCOPY WITH RETROGRADE PYELOGRAM  Patient Location: PACU  Anesthesia Type:General  Level of Consciousness: awake  Airway & Oxygen Therapy: Patient Spontanous Breathing and Patient connected to face mask oxygen  Post-op Assessment: Report given to RN and Post -op Vital signs reviewed and stable  Post vital signs: Reviewed and stable  Last Vitals:  Vitals Value Taken Time  BP 105/68 08/08/21 0831  Temp    Pulse 71 08/08/21 0835  Resp 19 08/08/21 0835  SpO2 97 % 08/08/21 0835  Vitals shown include unvalidated device data.  Last Pain:  Vitals:   08/08/21 0639  TempSrc: Temporal  PainSc: 3          Complications: No notable events documented.

## 2021-08-08 NOTE — Telephone Encounter (Signed)
Noted  

## 2021-08-08 NOTE — ED Provider Notes (Signed)
[facility] Provider Note    Event Date/Time   First MD Initiated Contact with Patient 08/08/21 1502     (approximate)   History   Chief Complaint Abdominal Pain and Emesis   HPI  Christopher Orr is a 76 y.o. male with past medical history of hyperlipidemia, CAD, COPD, CHF, hepatitis C, cirrhosis, alcohol abuse, and chronic pancreatitis who presents to the ED complaining of abdominal pain.  Patient reports that he has had about 1 week of intermittent pain affecting different areas of his abdomen.  He describes the pain as sharp, not exacerbated or alleviated by anything particular.  It can affect both sides of his lower abdomen as well as both sides of his upper abdomen at different times.  He has been feeling nauseous and has vomited a couple of times over the course of this illness.  He denies any flank pain or burning when he urinates, but does state that he had been leaking urine recently.  He did undergo cystoscopy with urethral dilation for stricture earlier today, had been discharged for about 2 hours before pain seemed to worsen.  He spoke with his PCPs office, who recommended he come to the ED for further evaluation.     Physical Exam   Triage Vital Signs: ED Triage Vitals  Enc Vitals Group     BP 08/08/21 1303 (!) 103/91     Pulse Rate 08/08/21 1303 61     Resp 08/08/21 1303 19     Temp 08/08/21 1303 98.1 F (36.7 C)     Temp Source 08/08/21 1303 Oral     SpO2 08/08/21 1303 93 %     Weight 08/08/21 1304 255 lb (115.7 kg)     Height 08/08/21 1304 6' (1.829 m)     Head Circumference --      Peak Flow --      Pain Score 08/08/21 1304 10     Pain Loc --      Pain Edu? --      Excl. in [place]? --     Most recent vital signs: Vitals:   08/08/21 1303 08/08/21 1753  BP: (!) 103/91 (!) 141/84  Pulse: 61 66  Resp: 19 20  Temp: 98.1 F (36.7 C) 99.2 F (37.3 C)  SpO2: 93% (S) 91%    Constitutional: Alert and oriented. Eyes: Conjunctivae are  normal. Head: Atraumatic. Nose: No congestion/rhinnorhea. Mouth/Throat: Mucous membranes are moist.  Cardiovascular: Normal rate, regular rhythm. Grossly normal heart sounds.  2+ radial pulses bilaterally. Respiratory: Normal respiratory effort.  No retractions. Lungs CTAB. Gastrointestinal: Soft and tender to palpation diffusely with no rebound or guarding. No distention.  No CVA tenderness bilaterally. Genitourinary: Foley catheter in place draining yellow urine with slight bloody tinge. Musculoskeletal: No lower extremity tenderness nor edema.  Neurologic:  Normal speech and language. No gross focal neurologic deficits are appreciated.    ED Results / Procedures / Treatments   Labs (all labs ordered are listed, but only abnormal results are displayed) Labs Reviewed  COMPREHENSIVE METABOLIC PANEL - Abnormal; Notable for the following components:      Result Value   Sodium 134 (*)    Glucose, Bld 143 (*)    All other components within normal limits  CBC - Abnormal; Notable for the following components:   WBC 16.5 (*)    RBC 3.80 (*)    Hemoglobin 12.4 (*)    HCT 37.8 (*)    All other components within  normal limits  URINALYSIS, ROUTINE W REFLEX MICROSCOPIC - Abnormal; Notable for the following components:   Color, Urine BROWN (*)    APPearance CLOUDY (*)    Hgb urine dipstick LARGE (*)    Ketones, ur 5 (*)    Protein, ur 100 (*)    Leukocytes,Ua SMALL (*)    RBC / HPF >50 (*)    WBC, UA >50 (*)    Bacteria, UA RARE (*)    All other components within normal limits  URINE CULTURE  LIPASE, BLOOD    RADIOLOGY CT of abdomen/pelvis reviewed by me with bilateral hydronephrosis and fat stranding on the left.  PROCEDURES:  Critical Care performed: No  Procedures   MEDICATIONS ORDERED IN ED: Medications  oxyCODONE-acetaminophen (PERCOCET/ROXICET) 5-325 MG per tablet 1 tablet (has no administration in time range)  ondansetron (ZOFRAN) injection 4 mg (4 mg Intravenous  Given 08/08/21 1313)  morphine 4 MG/ML injection 4 mg (4 mg Intravenous Given 08/08/21 1605)  lactated ringers bolus 1,000 mL (0 mLs Intravenous Stopped 08/08/21 1719)  iohexol (OMNIPAQUE) 300 MG/ML solution 100 mL (100 mLs Intravenous Contrast Given 08/08/21 1620)     IMPRESSION / MDM / ASSESSMENT AND PLAN / ED COURSE  I reviewed the triage vital signs and the nursing notes.                              76 y.o. male with past medical history of hyperlipidemia, CAD, CHF, COPD, hepatitis C, cirrhosis, alcohol abuse, and chronic pancreatitis who presents to the ED complaining of worsening intermittent abdominal pain over the past week associated with nausea and occasional vomiting.  He underwent cystoscopy with urethral dilatation earlier today and now has Foley catheter in place.  Differential diagnosis includes, but is not limited to, UTI, pyelonephritis, gastroenteritis, cholecystitis, biliary colic, pancreatitis, appendicitis, diverticulitis, obstructed Foley catheter.  Patient is well-appearing and in no acute distress, does have diffuse abdominal tenderness on exam.  Foley catheter appears to be draining appropriately with significant slightly blood-tinged urine in collecting bag.  There are signs of infection on UA, but presentation was reviewed with Dr. Diamantina Providence from urology and this is likely due to his procedure earlier today.  Given lack of urinary symptoms, low suspicion for UTI.  Per Dr. Diamantina Providence, given uncomplicated procedure there is very low likelihood for pain to be related to this.  We will further assess with CT scan given pain predated his procedure.  We will treat symptomatically with IV morphine and Zofran, hydrate with IV fluids.  CT scan remarkable for bilateral hydronephrosis and left perinephric fat stranding.  Findings discussed with Dr. Diamantina Providence of urology, fat stranding is apparently chronic and seen on CT from April of last year.  Hydronephrosis is not unexpected given his  cystoscopy with dye administration, patient now with Foley catheter in place that continues to drain appropriately.  He is appropriate for outpatient follow-up with urology, was counseled to return to the ED for new worsening symptoms, patient and family agree with plan.        FINAL CLINICAL IMPRESSION(S) / ED DIAGNOSES   Final diagnoses:  Generalized abdominal pain  Stricture of bulbous urethra in male, unspecified stricture type  Foley catheter in place     Rx / DC Orders   ED Discharge Orders     None        Note:  This document was prepared using Dragon voice recognition software and may  include unintentional dictation errors.   Blake Divine, MD 08/08/21 [phone]

## 2021-08-08 NOTE — Anesthesia Preprocedure Evaluation (Signed)
Anesthesia Evaluation  Patient identified by MRN, date of birth, ID band Patient awake    Reviewed: Allergy & Precautions, NPO status , Patient's Chart, lab work & pertinent test results  History of Anesthesia Complications Negative for: history of anesthetic complications  Airway Mallampati: III   Neck ROM: Full    Dental no notable dental hx.    Pulmonary Current Smoker (1 ppd)Patient did not abstain from smoking.,  Lung CA s/p resection    + decreased breath sounds      Cardiovascular hypertension, + CAD (s/p stents on Plavix, last dose 08/01/21)  Normal cardiovascular exam Rhythm:Regular Rate:Normal     Neuro/Psych PSYCHIATRIC DISORDERS (PTSD) Anxiety Depression Bipolar Disorder Dementia negative neurological ROS     GI/Hepatic GERD  Poorly Controlled,(+) Cirrhosis       , Hepatitis -, C  Endo/Other  Obesity   Renal/GU    BPH    Musculoskeletal   Abdominal   Peds  Hematology  (+) Blood dyscrasia, anemia ,   Anesthesia Other Findings   Reproductive/Obstetrics                             Anesthesia Physical Anesthesia Plan  ASA: 4  Anesthesia Plan: General   Post-op Pain Management:    Induction: Intravenous and Rapid sequence  PONV Risk Score and Plan: 1 and Ondansetron, Dexamethasone and Treatment may vary due to age or medical condition  Airway Management Planned: Oral ETT  Additional Equipment:   Intra-op Plan:   Post-operative Plan: Extubation in OR  Informed Consent: I have reviewed the patients History and Physical, chart, labs and discussed the procedure including the risks, benefits and alternatives for the proposed anesthesia with the patient or authorized representative who has indicated his/her understanding and acceptance.     Dental advisory given  Plan Discussed with: CRNA  Anesthesia Plan Comments: (Patient consented for risks of anesthesia  including but not limited to:  - adverse reactions to medications - damage to eyes, teeth, lips or other oral mucosa - nerve damage due to positioning  - sore throat or hoarseness - damage to heart, brain, nerves, lungs, other parts of body or loss of life  Informed patient about role of CRNA in peri- and intra-operative care.  Patient voiced understanding.)        Anesthesia Quick Evaluation

## 2021-08-08 NOTE — ED Notes (Signed)
EDP at [facility]. Daughter at [facility]. Pt alert, NAD, calm, interactive. C/o abd pain.

## 2021-08-08 NOTE — ED Triage Notes (Signed)
Pt comes into the ED via POV c/o severe abd pain and emesis that is uncontrolled.  Pt had a procedure this morning for a cystoscopy and pyelogram.  Pt has only been discharged for about 2 hours before the severe pain and vomiting started.  Pt actively vomiting in triage at this time.

## 2021-08-08 NOTE — ED Notes (Signed)
Up to b./r, dressed self, emptied leg bag.

## 2021-08-08 NOTE — ED Notes (Signed)
Pt to CT

## 2021-08-09 ENCOUNTER — Telehealth: Payer: Self-pay | Admitting: *Deleted

## 2021-08-09 MED ORDER — ONDANSETRON 4 MG PO TBDP: 4.0000 mg | ORAL_TABLET | Freq: Three times a day (TID) | ORAL | 0 refills | Status: DC | PRN

## 2021-08-09 NOTE — Telephone Encounter (Signed)
Okay to call in Zofran 4 mg disp #15  He was discharged just yesterday with 10 tablets of hydrocodone and thus the pharmacy will likely not let me refill this again already.    I recommend that he alternate his narcotics with ibuprofen and hopefully he will start feeling better.    Hollice Espy, MD

## 2021-08-09 NOTE — Telephone Encounter (Signed)
Patient called Triage line reports he went to ER last night with severe abdominal pain and nausea. He was treated and released. Still in some pain and cannot keep any food or water down. Denies any urinary symptoms.  He is requesting refill on pain medications and medication for nausea

## 2021-08-09 NOTE — Telephone Encounter (Signed)
Patient informed, sent in Zofran to [facility] as requested. He voiced understanding to alternate taking ibuprofen and narcotic.

## 2021-08-10 LAB — URINE CULTURE: Culture: NO GROWTH

## 2021-08-12 ENCOUNTER — Ambulatory Visit: Payer: Medicare HMO | Admitting: *Deleted

## 2021-08-12 ENCOUNTER — Telehealth: Payer: Self-pay | Admitting: Internal Medicine

## 2021-08-12 ENCOUNTER — Other Ambulatory Visit: Payer: Self-pay

## 2021-08-12 ENCOUNTER — Telehealth: Payer: Self-pay

## 2021-08-12 DIAGNOSIS — N35812 Other urethral bulbous stricture, male: Secondary | ICD-10-CM

## 2021-08-12 NOTE — Progress Notes (Signed)
Catheter Removal  Patient is present today for a catheter removal.  74ml of water was drained from the balloon. A 18FR foley cath was removed from the bladder no complications were noted . Patient tolerated well.  Performed by: Gaspar Cola CMA    Follow up/ Additional notes: Patient advised after lunch if he cant pee he needs to call the office and may need to come back in . Advised him to drink water today .

## 2021-08-12 NOTE — Telephone Encounter (Signed)
I am ok to do this I think has to be sent as DME ask Juliann Pulse how and where to send it I thinks its [facility] not pharmacy or somewhere else or can we give him one and have him sign the form?

## 2021-08-12 NOTE — Telephone Encounter (Signed)
Pt was on nurse schedule today for foley removal. No follow up scheduled? Does he need to be seen in the future for post op?

## 2021-08-12 NOTE — Telephone Encounter (Signed)
Pt called stating that he needs a nebulizer machine because his broke

## 2021-08-12 NOTE — Telephone Encounter (Signed)
Please schedule him for 6 weeks with [facility]  Hollice Espy, MD

## 2021-08-12 NOTE — Telephone Encounter (Signed)
Scheduled

## 2021-08-12 NOTE — Telephone Encounter (Signed)
Spoke with CMA and advised we can use one from the office if we have it.

## 2021-08-12 NOTE — Telephone Encounter (Signed)
Please advise, okay to send order to pharmacy?

## 2021-08-13 NOTE — Telephone Encounter (Signed)
Form filled out and Patient informed to come in and sign form to pick up the machine. Patient verbalized understanding  Machine and forms placed upfront

## 2021-08-23 ENCOUNTER — Other Ambulatory Visit: Payer: Self-pay | Admitting: Internal Medicine

## 2021-08-23 DIAGNOSIS — E785 Hyperlipidemia, unspecified: Secondary | ICD-10-CM

## 2021-08-23 DIAGNOSIS — R4189 Other symptoms and signs involving cognitive functions and awareness: Secondary | ICD-10-CM

## 2021-08-26 ENCOUNTER — Other Ambulatory Visit: Payer: Self-pay | Admitting: Internal Medicine

## 2021-08-26 DIAGNOSIS — R4189 Other symptoms and signs involving cognitive functions and awareness: Secondary | ICD-10-CM

## 2021-08-26 DIAGNOSIS — E785 Hyperlipidemia, unspecified: Secondary | ICD-10-CM

## 2021-08-26 MED ORDER — MEMANTINE HCL 5 MG PO TABS: 5.0000 mg | ORAL_TABLET | Freq: Two times a day (BID) | ORAL | 3 refills | Status: DC

## 2021-08-26 MED ORDER — ROSUVASTATIN CALCIUM 10 MG PO TABS: 10.0000 mg | ORAL_TABLET | Freq: Every day | ORAL | 3 refills | Status: DC

## 2021-08-26 NOTE — Telephone Encounter (Signed)
Filled by another provider are you okay with filling.

## 2021-09-16 ENCOUNTER — Telehealth: Payer: Self-pay | Admitting: Internal Medicine

## 2021-09-16 NOTE — Telephone Encounter (Signed)
Pt called in stating his oxygen level is at 84 and was that something to be concerned about. Sent to access nurse

## 2021-09-17 ENCOUNTER — Ambulatory Visit: Payer: Medicare HMO | Admitting: Nurse Practitioner

## 2021-09-17 ENCOUNTER — Encounter: Payer: Self-pay | Admitting: Nurse Practitioner

## 2021-09-17 NOTE — Progress Notes (Unsigned)
Office Visit    Patient Name: Christopher Orr Date of Encounter: 09/17/2021  Primary Care Provider:  McLean-Scocuzza, [name], MD Primary Cardiologist:  Kathlyn Sacramento, MD  Chief Complaint    76 year old male with a history of CAD status post LAD stenting (2006), HFpEF, hyperlipidemia, frequent PACs, brief runs of PSVT, dilated ascending aorta, COPD, tobacco abuse, prior heavy alcohol use, hepatitis C, prostate cancer, squamous cell carcinoma of the left lung status post wedge resection (2021), and mild dementia, who presents for follow-up related to ***  Past Medical History    Past Medical History:  Diagnosis Date   Alcohol abuse    pt states it is marijuana   Anemia    Arthritis    Bipolar disorder ([place])    BPH (benign prostatic hypertrophy) with urinary obstruction    CAD (coronary artery disease)    a. 2006 PCI/DES to LAD, EF 60%.   Chronic heart failure with preserved ejection fraction (HFpEF) ([place])    a. 07/2014 Echo: EF 55%; b. 11/2020 Echo: EF 50-55%, mod LVH, GrI DD, mildly enlarged RV w/ nl fxn, mild BAE.   Chronic pancreatitis (HCC)    Cirrhosis ([place])    COPD (chronic obstructive pulmonary disease) ([place])    COVID-19    08/22/20   Dementia ([place])    early dementia   Depression    Dilated ascending aorta and aortic root ([place])    a. 11/2020 Echo: Ao root 74mm, Asc Ao 65mm. Ao arch 54mm.   Diverticulitis    12/06/20 [facility]    Diverticulosis    Drug use    Dyspnea    ED (erectile dysfunction)    Gastritis    GERD (gastroesophageal reflux disease)    Headache    occasional migraines   Hepatitis C    treated   History of lumbar fusion    Hyperlipidemia    Hypertension    patient denies having high blood pressure   Lung cancer ([place])    Pancreatitis    x 2    Pneumonia    07/06/18    Premature atrial contractions    a. 04/2021 [facility]: Freq PACs w/ 8% burden.   Prostate cancer [facility])    with seeds   PSVT (paroxysmal supraventricular tachycardia) ([place])    a.  04/2021 [facility]: Avg HR 80 (60-210). 42 SVT runs - longest 5 beats @ 116, fastest 210 x 5 beats. Freq PACs (8%).   PTSD (post-traumatic stress disorder)    Rib fracture    s/p fall 03/15/19    Sinus disease    Urge incontinence    Past Surgical History:  Procedure Laterality Date   CARDIAC CATHETERIZATION  2006   cateract  2013   COLONOSCOPY WITH PROPOFOL N/A 10/04/2019   Procedure: COLONOSCOPY WITH PROPOFOL;  Surgeon: Lucilla Lame, MD;  Location: [facility] ENDOSCOPY;  Service: Endoscopy;  Laterality: N/A;   CORONARY ANGIOPLASTY  2006   CORONARY STENT PLACEMENT  2006   x 1   CYSTOSCOPY W/ RETROGRADES N/A 08/08/2021   Procedure: CYSTOSCOPY WITH RETROGRADE PYELOGRAM;  Surgeon: Hollice Espy, MD;  Location: [facility] ORS;  Service: Urology;  Laterality: N/A;   CYSTOSCOPY WITH URETHRAL DILATATION N/A 08/08/2021   Procedure: CYSTOSCOPY WITH URETHRAL DILATATION WITH UROLUME BALLOON;  Surgeon: Hollice Espy, MD;  Location: [facility] ORS;  Service: Urology;  Laterality: N/A;   ESOPHAGOGASTRODUODENOSCOPY N/A 12/22/2014   Procedure: ESOPHAGOGASTRODUODENOSCOPY (EGD);  Surgeon: [name], MD;  Location[facility] ENDOSCOPY;  Service: Endoscopy;  Laterality: N/A;   ESOPHAGOGASTRODUODENOSCOPY (EGD) WITH PROPOFOL N/A 10/04/2019   Procedure: ESOPHAGOGASTRODUODENOSCOPY (EGD) WITH PROPOFOL;  Surgeon: Lucilla Lame, MD;  Location: [facility] ENDOSCOPY;  Service: Endoscopy;  Laterality: N/A;   HERNIA REPAIR  2015   left groin   INTERCOSTAL NERVE BLOCK Left 11/09/2019   Procedure: Intercostal Nerve Block;  Surgeon: Melrose Nakayama, MD;  Location: [place];  Service: Thoracic;  Laterality: Left;   LUMBAR FUSION     RADIOACTIVE SEED IMPLANT N/A 04/22/2016   Procedure: RADIOACTIVE SEED IMPLANT/BRACHYTHERAPY IMPLANT;  Surgeon: Hollice Espy, MD;  Location: [facility] ORS;  Service: Urology;  Laterality: N/A;   ROBOT ASSISTED INGUINAL HERNIA REPAIR Bilateral 07/06/2018   Procedure: ROBOT ASSISTED INGUINAL HERNIA REPAIR;  Surgeon: Jules Husbands, MD;  Location: [facility] ORS;  Service: General;  Laterality: Bilateral;   TONSILLECTOMY     UMBILICAL HERNIA REPAIR N/A 07/06/2018   Procedure: LAPAROSCOPIC ROBOT ASSISTED UMBILICAL HERNIA;  Surgeon: Jules Husbands, MD;  Location: [facility] ORS;  Service: General;  Laterality: N/A;   XI ROBOTIC ASSISTED THORACOSCOPY- SEGMENTECTOMY Left 11/09/2019   Procedure: XI ROBOTIC ASSISTED THORACOSCOPY-WEDGE RESECTION LEFT LOWER LOBE;  Surgeon: Melrose Nakayama, MD;  Location: [place];  Service: Thoracic;  Laterality: Left;    Allergies  No Known Allergies  History of Present Illness    76 year old male with above past medical history including CAD, HFpEF, hyperlipidemia, frequent PACs, brief runs of PSVT, dilated ascending aorta, COPD, tobacco abuse, prior heavy alcohol use, hepatitis C, prostate cancer, squamous cell carcinoma of the left lung status post wedge resection (2021), and mild dementia.  Following stenting of his LAD in 2006, he did well.  He did require repeat catheterization 2011, which showed patent stent with mild restenosis.  In April 2021, he was diagnosed with squamous cell carcinoma of the left lung.  He underwent left lower lobe robotic assisted wedge resection and did not require radiation or chemotherapy.  In April 2022, he suffered a syncopal spell in the setting of alcohol, poor p.o. intake, narcotic and benzodiazepine usage, dehydration, cirrhosis, and Flomax use.  ER work-up was unremarkable.  He f/u w/ primary care and metoprolol was reduced, while furosemide was discontinued.  An echocardiogram was performed which showed an EF of 50 to 55% with grade 1 diastolic dysfunction.  Metoprolol was subsequently discontinued in the setting of mild hypotension and bradycardia, when he saw Dr. Fletcher Anon in May 2022.  In the setting of reports of recurrent syncope, an event monitor was placed in September 2022, that showed frequent PACs with a 7.8% burden, as well as 25 brief runs of SVT.  He was  placed back on metoprolol therapy and underwent repeat monitoring, which showed an 8% PAC burden with 42 brief runs of SVT, the longest lasting 5 beats with the fastest at a rate of 210 bpm.  No symptoms were reported.  His Toprol-XL was increased to 50 mg daily and he was referred to electrophysiology, where he was seen December 7 with recommendation for continuation of beta-blocker therapy.  Mr. Kuennen was seen in the ER on January 12 secondary to a 1 week history of intermittent abdominal pain associated with nausea and vomiting.  CT of the abdomen was notable for bilateral hydronephrosis and left perinephric fat stranding.  Case was discussed with urology and in the setting of recent cystoscopy with contrast administration, it was felt that hydronephrosis was an expected finding.  He was discharged home from the ED.  Home Medications  Current Outpatient Medications  Medication Sig Dispense Refill   albuterol (PROVENTIL) (2.5 MG/3ML) 0.083% nebulizer solution TAKE 3 MLS BY NEBULIZATION EVERY 4 (FOUR) HOURS AS NEEDED FOR WHEEZING OR SHORTNESS OF BREATH. (Patient not taking: Reported on 08/08/2021) 375 mL 3   albuterol (VENTOLIN HFA) 108 (90 Base) MCG/ACT inhaler Inhale 1-2 puffs into the lungs every 6 (six) hours as needed for wheezing or shortness of breath. 18 g 12   ARIPiprazole (ABILIFY) 10 MG tablet Take 10 mg by mouth daily.     aspirin EC 81 MG tablet Take 81 mg by mouth daily.     Budeson-Glycopyrrol-Formoterol (BREZTRI AEROSPHERE) 160-9-4.8 MCG/ACT AERO Inhale 2 puffs into the lungs in the morning and at bedtime. (Patient not taking: Reported on 08/08/2021) 5.9 g 0   budesonide-formoterol (SYMBICORT) 160-4.5 MCG/ACT inhaler INHALE 2 PUFFS INTO THE LUNGS 2 (TWO) TIMES DAILY. RINSE MOUTH OUT 30.6 each 4   celecoxib (CELEBREX) 200 MG capsule Take 1 capsule (200 mg total) by mouth daily after breakfast. 90 capsule 1   clopidogrel (PLAVIX) 75 MG tablet Take 1 tablet (75 mg total) by mouth daily.  90 tablet 3   donepezil (ARICEPT) 5 MG tablet Take 1 tablet (5 mg total) by mouth at bedtime. Further refills per [facility] neurology 90 tablet 3   FLUoxetine (PROZAC) 40 MG capsule Take 40 mg by mouth daily.     HYDROcodone-acetaminophen (NORCO/VICODIN) 5-325 MG tablet Take 1-2 tablets by mouth every 6 (six) hours as needed for moderate pain. 10 tablet 0   hydrOXYzine (ATARAX/VISTARIL) 25 MG tablet Take 25 mg by mouth daily as needed for anxiety.     memantine (NAMENDA) 5 MG tablet Take 1 tablet (5 mg total) by mouth 2 (two) times daily. 180 tablet 3   metoprolol succinate (TOPROL-XL) 50 MG 24 hr tablet Take 1 tablet (50 mg total) by mouth daily. 90 tablet 1   Multiple Vitamins-Minerals (MULTIVITAMIN WITH MINERALS) tablet Take 1 tablet by mouth daily.     NEOMYCIN-POLYMYXIN-HYDROCORTISONE (CORTISPORIN) 1 % SOLN OTIC solution Apply to nail beds from procedure site twice daily after soaks (Patient not taking: Reported on 07/19/2021) 10 mL 0   nitroGLYCERIN (NITROSTAT) 0.4 MG SL tablet Place 1 tablet (0.4 mg total) under the tongue every 5 (five) minutes as needed for chest pain. Maximum of 3 doses 25 tablet 3   ondansetron (ZOFRAN-ODT) 4 MG disintegrating tablet Take 1 tablet (4 mg total) by mouth every 8 (eight) hours as needed for nausea or vomiting. 15 tablet 0   pantoprazole (PROTONIX) 40 MG tablet Take 1 tablet (40 mg total) by mouth daily. 30 min before breakfast 90 tablet 3   rosuvastatin (CRESTOR) 10 MG tablet Take 1 tablet (10 mg total) by mouth at bedtime. 90 tablet 3   sucralfate (CARAFATE) 1 g tablet TAKE 1 TABLET BY MOUTH FOUR TIMES A DAY 120 tablet 11   tamsulosin (FLOMAX) 0.4 MG CAPS capsule TAKE 1 CAPSULE (0.4 MG TOTAL) BY MOUTH DAILY AFTER SUPPER. 90 capsule 3   traZODone (DESYREL) 150 MG tablet Take 1 tablet (150 mg total) by mouth at bedtime. 90 tablet 1   Vibegron (GEMTESA) 75 MG TABS Take 75 mg by mouth daily. 30 tablet 0   No current facility-administered medications for this visit.      Review of Systems    ***.  All other systems reviewed and are otherwise negative except as noted above.    Physical Exam    VS:  There were no vitals taken  for this visit. , BMI There is no height or weight on file to calculate BMI.     GEN: Well nourished, well developed, in no acute distress. HEENT: normal. Neck: Supple, no JVD, carotid bruits, or masses. Cardiac: RRR, no murmurs, rubs, or gallops. No clubbing, cyanosis, edema.  Radials/DP/PT 2+ and equal bilaterally.  Respiratory:  Respirations regular and unlabored, clear to auscultation bilaterally. GI: Soft, nontender, nondistended, BS + x 4. MS: no deformity or atrophy. Skin: warm and dry, no rash. Neuro:  Strength and sensation are intact. Psych: Normal affect.  Accessory Clinical Findings    ECG personally reviewed by me today - *** - no acute changes.  Lab Results  Component Value Date   WBC 16.5 (H) 08/08/2021   HGB 12.4 (L) 08/08/2021   HCT 37.8 (L) 08/08/2021   MCV 99.5 08/08/2021   PLT 247 08/08/2021   Lab Results  Component Value Date   CREATININE 1.12 08/08/2021   BUN 18 08/08/2021   NA 134 (L) 08/08/2021   K 4.0 08/08/2021   CL 104 08/08/2021   CO2 23 08/08/2021   Lab Results  Component Value Date   ALT 19 08/08/2021   AST 23 08/08/2021   ALKPHOS 88 08/08/2021   BILITOT 0.4 08/08/2021   Lab Results  Component Value Date   CHOL 98 07/18/2021   HDL 43.80 07/18/2021   LDLCALC 25 07/18/2021   TRIG 146.0 07/18/2021   CHOLHDL 2 07/18/2021    Lab Results  Component Value Date   HGBA1C 5.8 07/18/2021    Assessment & Plan    1.  ***   Murray Hodgkins, NP 09/17/2021, 10:00 AM

## 2021-09-17 NOTE — Telephone Encounter (Signed)
Spoke with pt to follow up and he stated that he has not been evaluated. Pt stated that he "is better now". Had pt check his O2 while on the phone and it was 87% but by the time I got off the phone with him it was 92%. Pt was advised that he needs to be evaluated with his oxygen dropping down as low as 84%. Pt stated that he is unable to go anywhere this evening because he is babysitting his grandchildren. Pt was advised that if his symptoms worsened he would need to find someone to come get his grandchildren so he can get to the ED. Pt gave verbal understanding. Also scheduled pt for a virtual visit with Dr. Olivia Mackie tomorrow at 1:15pm.

## 2021-09-18 ENCOUNTER — Encounter: Payer: Self-pay | Admitting: Internal Medicine

## 2021-09-18 ENCOUNTER — Telehealth (INDEPENDENT_AMBULATORY_CARE_PROVIDER_SITE_OTHER): Payer: Medicare HMO | Admitting: Internal Medicine

## 2021-09-18 ENCOUNTER — Other Ambulatory Visit: Payer: Self-pay

## 2021-09-18 ENCOUNTER — Encounter: Payer: Self-pay | Admitting: Nurse Practitioner

## 2021-09-18 VITALS — Ht 72.0 in | Wt 265.0 lb

## 2021-09-18 DIAGNOSIS — J441 Chronic obstructive pulmonary disease with (acute) exacerbation: Secondary | ICD-10-CM | POA: Diagnosis not present

## 2021-09-18 DIAGNOSIS — Z85118 Personal history of other malignant neoplasm of bronchus and lung: Secondary | ICD-10-CM

## 2021-09-18 DIAGNOSIS — J321 Chronic frontal sinusitis: Secondary | ICD-10-CM | POA: Diagnosis not present

## 2021-09-18 MED ORDER — LEVOFLOXACIN 750 MG PO TABS: 750.0000 mg | ORAL_TABLET | Freq: Every day | ORAL | 0 refills | Status: DC

## 2021-09-18 MED ORDER — PREDNISONE 20 MG PO TABS: 40.0000 mg | ORAL_TABLET | Freq: Every day | ORAL | 0 refills | Status: DC

## 2021-09-18 MED ORDER — DM-GUAIFENESIN ER 60-1200 MG PO TB12: 1.0000 | ORAL_TABLET | Freq: Two times a day (BID) | ORAL | 0 refills | Status: DC

## 2021-09-18 MED ORDER — SALINE SPRAY 0.65 % NA SOLN: 2.0000 | NASAL | 11 refills | Status: DC | PRN

## 2021-09-18 MED ORDER — FLUTICASONE PROPIONATE 50 MCG/ACT NA SUSP: 2.0000 | Freq: Every day | NASAL | 11 refills | Status: DC

## 2021-09-18 NOTE — Telephone Encounter (Signed)
Agree will need to call 911 if O2 <90 %  I do not manage low O2 needs to go to ED or f/u with pulmonary asap  Rec stop smoking if doing so as well  Needs f/u with pulmonary as well asap

## 2021-09-18 NOTE — Telephone Encounter (Signed)
Patient seen virtually today, 09/18/21

## 2021-09-18 NOTE — Progress Notes (Signed)
Patient states that when waking up in the morning he has a headache, stuffy and runny nose. This goes away through out the day. Clear mucus from the nose, coughing up white phlegm. Having back pain as well.   No one around the Patient is sick.

## 2021-09-18 NOTE — Progress Notes (Signed)
Virtual Visit via Video Note  I connected with [name]  on 09/18/21 at  1:15 PM EST by a video enabled telemedicine application and verified that I am speaking with the correct person using two identifiers.  Location patient: [place] Location provider:work or home office Persons participating in the virtual visit: patient, provider  I discussed the limitations and requested verbal permission for telemedicine visit. The patient expressed understanding and agreed to proceed.   HPI:  Acute telemedicine visit for : Patient states that when waking up in the morning he has a headache, stuffy and runny nose. This goes away through out the day. Clear mucus from the nose, coughing up white phlegm. Having back pain as well.    No one around the Patient is sick. 2 home covid tests negative  C/o nasal congestion clear, h/a in am sob, white phelgm x weeks h/o copd denies sore throat/fever having +sinus pain and pressure prior imaging moderate copd and h/o lung cnacer  O2 sats now 88-95% with home pulse ox   -Pertinent past medical history: see below -Pertinent medication allergies:No Known Allergies -COVID-19 vaccine status:  Immunization History  Administered Date(s) Administered   Fluad Quad(high Dose 65+) 05/04/2019   Influenza, High Dose Seasonal PF 04/15/2019, 05/10/2020   Influenza,inj,Quad PF,6+ Mos 05/04/2018   Influenza-Unspecified 04/27/2021   PFIZER(Purple Top)SARS-COV-2 Vaccination 09/28/2019, 10/19/2019, 05/10/2020   Pneumococcal Conjugate-13 04/15/2019     ROS: See pertinent positives and negatives per HPI.  Past Medical History:  Diagnosis Date   Alcohol abuse    pt states it is marijuana   Anemia    Arthritis    Bipolar disorder (HCC)    BPH (benign prostatic hypertrophy) with urinary obstruction    CAD (coronary artery disease)    a. 2006 PCI/DES to LAD, EF 60%.   Chronic heart failure with preserved ejection fraction (HFpEF) ([place])    a. 07/2014 Echo: EF 55%; b.  11/2020 Echo: EF 50-55%, mod LVH, GrI DD, mildly enlarged RV w/ nl fxn, mild BAE.   Chronic pancreatitis (HCC)    Cirrhosis ([place])    COPD (chronic obstructive pulmonary disease) ([place])    COVID-19    08/22/20   Dementia ([place])    early dementia   Depression    Dilated ascending aorta and aortic root ([place])    a. 11/2020 Echo: Ao root 56mm, Asc Ao 34mm. Ao arch 65mm.   Diverticulitis    12/06/20 [facility]    Diverticulosis    Drug use    Dyspnea    ED (erectile dysfunction)    Gastritis    GERD (gastroesophageal reflux disease)    Headache    occasional migraines   Hepatitis C    treated   History of lumbar fusion    Hyperlipidemia    Hypertension    patient denies having high blood pressure   Lung cancer ([place])    Pancreatitis    x 2    Pneumonia    07/06/18    Premature atrial contractions    a. 04/2021 [facility]: Freq PACs w/ 8% burden.   Prostate cancer [facility])    with seeds   PSVT (paroxysmal supraventricular tachycardia) ([place])    a. 04/2021 [facility]: Avg HR 80 (60-210). 42 SVT runs - longest 5 beats @ 116, fastest 210 x 5 beats. Freq PACs (8%).   PTSD (post-traumatic stress disorder)    Rib fracture    s/p fall 03/15/19    Sinus disease    Urge incontinence  Past Surgical History:  Procedure Laterality Date   CARDIAC CATHETERIZATION  2006   cateract  2013   COLONOSCOPY WITH PROPOFOL N/A 10/04/2019   Procedure: COLONOSCOPY WITH PROPOFOL;  Surgeon: Lucilla Lame, MD;  Location: [facility] ENDOSCOPY;  Service: Endoscopy;  Laterality: N/A;   CORONARY ANGIOPLASTY  2006   CORONARY STENT PLACEMENT  2006   x 1   CYSTOSCOPY W/ RETROGRADES N/A 08/08/2021   Procedure: CYSTOSCOPY WITH RETROGRADE PYELOGRAM;  Surgeon: Hollice Espy, MD;  Location: [facility] ORS;  Service: Urology;  Laterality: N/A;   CYSTOSCOPY WITH URETHRAL DILATATION N/A 08/08/2021   Procedure: CYSTOSCOPY WITH URETHRAL DILATATION WITH UROLUME BALLOON;  Surgeon: Hollice Espy, MD;  Location: [facility] ORS;  Service: Urology;  Laterality:  N/A;   ESOPHAGOGASTRODUODENOSCOPY N/A 12/22/2014   Procedure: ESOPHAGOGASTRODUODENOSCOPY (EGD);  Surgeon: [name], MD;  Location: [facility] ENDOSCOPY;  Service: Endoscopy;  Laterality: N/A;   ESOPHAGOGASTRODUODENOSCOPY (EGD) WITH PROPOFOL N/A 10/04/2019   Procedure: ESOPHAGOGASTRODUODENOSCOPY (EGD) WITH PROPOFOL;  Surgeon: Lucilla Lame, MD;  Location: [facility] ENDOSCOPY;  Service: Endoscopy;  Laterality: N/A;   HERNIA REPAIR  2015   left groin   INTERCOSTAL NERVE BLOCK Left 11/09/2019   Procedure: Intercostal Nerve Block;  Surgeon: Melrose Nakayama, MD;  Location: [place];  Service: Thoracic;  Laterality: Left;   LUMBAR FUSION     RADIOACTIVE SEED IMPLANT N/A 04/22/2016   Procedure: RADIOACTIVE SEED IMPLANT/BRACHYTHERAPY IMPLANT;  Surgeon: Hollice Espy, MD;  Location: [facility] ORS;  Service: Urology;  Laterality: N/A;   ROBOT ASSISTED INGUINAL HERNIA REPAIR Bilateral 07/06/2018   Procedure: ROBOT ASSISTED INGUINAL HERNIA REPAIR;  Surgeon: Jules Husbands, MD;  Location: [facility] ORS;  Service: General;  Laterality: Bilateral;   TONSILLECTOMY     UMBILICAL HERNIA REPAIR N/A 07/06/2018   Procedure: LAPAROSCOPIC ROBOT ASSISTED UMBILICAL HERNIA;  Surgeon: Jules Husbands, MD;  Location: [facility] ORS;  Service: General;  Laterality: N/A;   XI ROBOTIC ASSISTED THORACOSCOPY- SEGMENTECTOMY Left 11/09/2019   Procedure: XI ROBOTIC ASSISTED THORACOSCOPY-WEDGE RESECTION LEFT LOWER LOBE;  Surgeon: Melrose Nakayama, MD;  Location: [facility] OR;  Service: Thoracic;  Laterality: Left;     Current Outpatient Medications:    albuterol (PROVENTIL) (2.5 MG/3ML) 0.083% nebulizer solution, TAKE 3 MLS BY NEBULIZATION EVERY 4 (FOUR) HOURS AS NEEDED FOR WHEEZING OR SHORTNESS OF BREATH., Disp: 375 mL, Rfl: 3   albuterol (VENTOLIN HFA) 108 (90 Base) MCG/ACT inhaler, Inhale 1-2 puffs into the lungs every 6 (six) hours as needed for wheezing or shortness of breath., Disp: 18 g, Rfl: 12   ARIPiprazole (ABILIFY) 10 MG tablet, Take 10  mg by mouth daily., Disp: , Rfl:    aspirin EC 81 MG tablet, Take 81 mg by mouth daily., Disp: , Rfl:    budesonide-formoterol (SYMBICORT) 160-4.5 MCG/ACT inhaler, INHALE 2 PUFFS INTO THE LUNGS 2 (TWO) TIMES DAILY. RINSE MOUTH OUT, Disp: 30.6 each, Rfl: 4   celecoxib (CELEBREX) 200 MG capsule, Take 1 capsule (200 mg total) by mouth daily after breakfast., Disp: 90 capsule, Rfl: 1   clopidogrel (PLAVIX) 75 MG tablet, Take 1 tablet (75 mg total) by mouth daily., Disp: 90 tablet, Rfl: 3   Dextromethorphan-Guaifenesin 60-1200 MG 12hr tablet, Take 1 tablet by mouth every 12 (twelve) hours. Prn cough, Disp: 30 tablet, Rfl: 0   donepezil (ARICEPT) 5 MG tablet, Take 1 tablet (5 mg total) by mouth at bedtime. Further refills per [facility] neurology, Disp: 90 tablet, Rfl: 3   FLUoxetine (PROZAC) 40 MG capsule, Take 40 mg by mouth daily.,  Disp: , Rfl:    fluticasone (FLONASE) 50 MCG/ACT nasal spray, Place 2 sprays into both nostrils daily. Prn after nasal saline, Disp: 16 g, Rfl: 11   levofloxacin (LEVAQUIN) 750 MG tablet, Take 1 tablet (750 mg total) by mouth daily. With food, Disp: 7 tablet, Rfl: 0   memantine (NAMENDA) 5 MG tablet, Take 1 tablet (5 mg total) by mouth 2 (two) times daily., Disp: 180 tablet, Rfl: 3   metoprolol succinate (TOPROL-XL) 50 MG 24 hr tablet, Take 1 tablet (50 mg total) by mouth daily., Disp: 90 tablet, Rfl: 1   Multiple Vitamins-Minerals (MULTIVITAMIN WITH MINERALS) tablet, Take 1 tablet by mouth daily., Disp: , Rfl:    MYRBETRIQ 50 MG TB24 tablet, Take by mouth., Disp: , Rfl:    ondansetron (ZOFRAN-ODT) 4 MG disintegrating tablet, Take 1 tablet (4 mg total) by mouth every 8 (eight) hours as needed for nausea or vomiting., Disp: 15 tablet, Rfl: 0   pantoprazole (PROTONIX) 40 MG tablet, Take 1 tablet (40 mg total) by mouth daily. 30 min before breakfast, Disp: 90 tablet, Rfl: 3   predniSONE (DELTASONE) 20 MG tablet, Take 2 tablets (40 mg total) by mouth daily with breakfast. In am, Disp: 14  tablet, Rfl: 0   rosuvastatin (CRESTOR) 10 MG tablet, Take 1 tablet (10 mg total) by mouth at bedtime., Disp: 90 tablet, Rfl: 3   sodium chloride (OCEAN) 0.65 % SOLN nasal spray, Place 2 sprays into both nostrils as needed for congestion., Disp: 30 mL, Rfl: 11   sucralfate (CARAFATE) 1 g tablet, TAKE 1 TABLET BY MOUTH FOUR TIMES A DAY, Disp: 120 tablet, Rfl: 11   tamsulosin (FLOMAX) 0.4 MG CAPS capsule, TAKE 1 CAPSULE (0.4 MG TOTAL) BY MOUTH DAILY AFTER SUPPER., Disp: 90 capsule, Rfl: 3   traZODone (DESYREL) 150 MG tablet, Take 1 tablet (150 mg total) by mouth at bedtime., Disp: 90 tablet, Rfl: 1   Vibegron (GEMTESA) 75 MG TABS, Take 75 mg by mouth daily., Disp: 30 tablet, Rfl: 0   Budeson-Glycopyrrol-Formoterol (BREZTRI AEROSPHERE) 160-9-4.8 MCG/ACT AERO, Inhale 2 puffs into the lungs in the morning and at bedtime. (Patient not taking: Reported on 08/08/2021), Disp: 5.9 g, Rfl: 0   HYDROcodone-acetaminophen (NORCO/VICODIN) 5-325 MG tablet, Take 1-2 tablets by mouth every 6 (six) hours as needed for moderate pain. (Patient not taking: Reported on 09/18/2021), Disp: 10 tablet, Rfl: 0   hydrOXYzine (ATARAX/VISTARIL) 25 MG tablet, Take 25 mg by mouth daily as needed for anxiety. (Patient not taking: Reported on 09/18/2021), Disp: , Rfl:    NEOMYCIN-POLYMYXIN-HYDROCORTISONE (CORTISPORIN) 1 % SOLN OTIC solution, Apply to nail beds from procedure site twice daily after soaks (Patient not taking: Reported on 07/19/2021), Disp: 10 mL, Rfl: 0   nitroGLYCERIN (NITROSTAT) 0.4 MG SL tablet, Place 1 tablet (0.4 mg total) under the tongue every 5 (five) minutes as needed for chest pain. Maximum of 3 doses (Patient not taking: Reported on 09/18/2021), Disp: 25 tablet, Rfl: 3  EXAM:  VITALS per patient if applicable:  GENERAL: alert, oriented, appears well and in no acute distress  HEENT: atraumatic, conjunttiva clear, no obvious abnormalities on inspection of external nose and ears  NECK: normal movements of the  head and neck  LUNGS: on inspection no signs of respiratory distress, breathing rate appears normal, no obvious gross SOB, gasping or wheezing  CV: no obvious cyanosis  MS: moves all visible extremities without noticeable abnormality  PSYCH/NEURO: pleasant and cooperative, no obvious depression or anxiety, speech and thought processing grossly  intact  ASSESSMENT AND PLAN:  Discussed the following assessment and plan:  COPD exacerbation ([place]) - Plan: predniSONE (DELTASONE) 20 MG tablet, levofloxacin (LEVAQUIN) 750 MG tablet, Dextromethorphan-Guaifenesin 60-1200 MG 12hr tablet, Ambulatory referral to Pulmonology Rec prn albuterol neb and inhaler has symbicort 160 not using rec use 2 pf bid rinse mouth   Frontal sinusitis, unspecified chronicity - Plan: predniSONE (DELTASONE) 20 MG tablet, levofloxacin (LEVAQUIN) 750 MG tablet, Dextromethorphan-Guaifenesin 60-1200 MG 12hr tablet  History of lung cancer - Plan: Ambulatory referral to Pulmonology  -we discussed possible serious and likely etiologies, options for evaluation and workup, limitations of telemedicine visit vs in person visit, treatment, treatment risks and precautions. Pt is agreeable to treatment via telemedicine at this moment.  I discussed the assessment and treatment plan with the patient. The patient was provided an opportunity to ask questions and all were answered. The patient agreed with the plan and demonstrated an understanding of the instructions.    Time spent 20 min Delorise Jackson, MD

## 2021-09-18 NOTE — Telephone Encounter (Signed)
Inform and then pt needs to call [facility] for f/u

## 2021-09-18 NOTE — Telephone Encounter (Signed)
Patient has not been seen in the office since April 2022 and failed to follow-up.  I recommend that he be evaluated in the ED.  Our ASAP slots are extremely limited and currently taken.

## 2021-09-25 ENCOUNTER — Ambulatory Visit: Payer: Medicare HMO | Admitting: Medical

## 2021-09-25 ENCOUNTER — Encounter: Payer: Self-pay | Admitting: Urology

## 2021-09-25 ENCOUNTER — Encounter: Payer: Self-pay | Admitting: Medical

## 2021-09-25 ENCOUNTER — Ambulatory Visit: Payer: Medicare HMO | Admitting: Urology

## 2021-09-25 ENCOUNTER — Other Ambulatory Visit: Payer: Self-pay

## 2021-09-25 VITALS — BP 122/71 | HR 70 | Ht 72.0 in | Wt 256.0 lb

## 2021-09-25 VITALS — BP 100/68 | HR 65 | Ht 72.0 in | Wt 256.1 lb

## 2021-09-25 DIAGNOSIS — I5032 Chronic diastolic (congestive) heart failure: Secondary | ICD-10-CM

## 2021-09-25 DIAGNOSIS — I491 Atrial premature depolarization: Secondary | ICD-10-CM

## 2021-09-25 DIAGNOSIS — E782 Mixed hyperlipidemia: Secondary | ICD-10-CM | POA: Diagnosis not present

## 2021-09-25 DIAGNOSIS — N3941 Urge incontinence: Secondary | ICD-10-CM

## 2021-09-25 DIAGNOSIS — I2510 Atherosclerotic heart disease of native coronary artery without angina pectoris: Secondary | ICD-10-CM

## 2021-09-25 DIAGNOSIS — F1910 Other psychoactive substance abuse, uncomplicated: Secondary | ICD-10-CM | POA: Diagnosis not present

## 2021-09-25 LAB — BLADDER SCAN AMB NON-IMAGING: Scan Result: 20

## 2021-09-25 NOTE — Progress Notes (Signed)
09/25/21 6:36 PM   Christopher Orr [birth year] [id number]  Referring provider:  McLean-Scocuzza, [name], MD [place],  [place] [address] Chief Complaint  Patient presents with   Hematuria     HPI: Christopher Orr is a 76 y.o.male with a personal history of of prostate cancer, BPH with LUTS, urinary frequency, ED, renal cyst, and microscopic hematuria, who presents today for   He was diagnosed with prostate cancer on 12/20/2015. Pathology revealed low risk prostate cancer with Gleason 3+3 involving 7 our of 12 cores  including all cores on left and single core at right apex up to 70% tissue.  TRUS volume 42 cc. He is s/p brachytherapy seed implant on 04/22/2016.    He underwent an MRI on 12/27/2020 for further evaluation of lesion on the right  kidney. It showed that the lesion of concern in the right kidney anteriorly has reduced complexity compared to previously, and is currently categorized as a Bosniak category 2 complex but benign cyst. Bosniak category 1 and category 2 cysts are present in the kidneys.  He underwent a cystoscopy on 07/11/2021 and was found to have a bulbar urethral stricture. He was taken to the OR on 08/08/2021 for urethral dilation via optilume balloon. Intraoperative findings: Tight approximately 8 French bulbar urethral stricture through which an open-ended ureteral catheter was able to be passed in bluntly dilated with the cystoscope, relatively soft and short.  Trabeculated bladder.  Unremarkable bilateral retrograde pyelogram.  He was seen in the ED post-op and had mild bilateral hydronephrosis.   Foley catheter has since been removed.  He reports that his urinary symptoms have improved he denies any burning. He reports that he is wearing diapers and he has leakage he changes it once a day so he does not have a urinary accident.  He is still drinking significant alcohol.   IPSS     Row Name 09/25/21 1300         International Prostate Symptom  Score   How often have you had the sensation of not emptying your bladder? Less than 1 in 5     How often have you had to urinate less than every two hours? About half the time     How often have you found you stopped and started again several times when you urinated? Not at All     How often have you found it difficult to postpone urination? About half the time     How often have you had a weak urinary stream? Less than 1 in 5 times     How often have you had to strain to start urination? Not at All     How many times did you typically get up at night to urinate? 2 Times     Total IPSS Score 10       Quality of Life due to urinary symptoms   If you were to spend the rest of your life with your urinary condition just the way it is now how would you feel about that? Mixed              Score:  1-7 Mild 8-19 Moderate 20-35 Severe  PMH: Past Medical History:  Diagnosis Date   Alcohol abuse    pt states it is marijuana   Anemia    Arthritis    Bipolar disorder (HCC)    BPH (benign prostatic hypertrophy) with urinary obstruction    CAD (coronary artery disease)  a. 2006 PCI/DES to LAD, EF 60%.   Chronic heart failure with preserved ejection fraction (HFpEF) ([place])    a. 07/2014 Echo: EF 55%; b. 11/2020 Echo: EF 50-55%, mod LVH, GrI DD, mildly enlarged RV w/ nl fxn, mild BAE.   Chronic pancreatitis (HCC)    Cirrhosis ([place])    COPD (chronic obstructive pulmonary disease) ([place])    COVID-19    08/22/20   Dementia ([place])    early dementia   Depression    Dilated ascending aorta and aortic root ([place])    a. 11/2020 Echo: Ao root 37mm, Asc Ao 43mm. Ao arch 48mm.   Diverticulitis    12/06/20 [facility]    Diverticulosis    Drug use    Dyspnea    ED (erectile dysfunction)    Gastritis    GERD (gastroesophageal reflux disease)    Headache    occasional migraines   Hepatitis C    treated   History of lumbar fusion    Hyperlipidemia    Hypertension    patient denies having high blood  pressure   Lung cancer ([place])    Pancreatitis    x 2    Pneumonia    07/06/18    Premature atrial contractions    a. 04/2021 [facility]: Freq PACs w/ 8% burden.   Prostate cancer [facility])    with seeds   PSVT (paroxysmal supraventricular tachycardia) ([place])    a. 04/2021 [facility]: Avg HR 80 (60-210). 42 SVT runs - longest 5 beats @ 116, fastest 210 x 5 beats. Freq PACs (8%).   PTSD (post-traumatic stress disorder)    Rib fracture    s/p fall 03/15/19    Sinus disease    Urge incontinence     Surgical History: Past Surgical History:  Procedure Laterality Date   CARDIAC CATHETERIZATION  2006   cateract  2013   COLONOSCOPY WITH PROPOFOL N/A 10/04/2019   Procedure: COLONOSCOPY WITH PROPOFOL;  Surgeon: Lucilla Lame, MD;  Location: [facility] ENDOSCOPY;  Service: Endoscopy;  Laterality: N/A;   CORONARY ANGIOPLASTY  2006   CORONARY STENT PLACEMENT  2006   x 1   CYSTOSCOPY W/ RETROGRADES N/A 08/08/2021   Procedure: CYSTOSCOPY WITH RETROGRADE PYELOGRAM;  Surgeon: Hollice Espy, MD;  Location: [facility] ORS;  Service: Urology;  Laterality: N/A;   CYSTOSCOPY WITH URETHRAL DILATATION N/A 08/08/2021   Procedure: CYSTOSCOPY WITH URETHRAL DILATATION WITH UROLUME BALLOON;  Surgeon: Hollice Espy, MD;  Location: [facility] ORS;  Service: Urology;  Laterality: N/A;   ESOPHAGOGASTRODUODENOSCOPY N/A 12/22/2014   Procedure: ESOPHAGOGASTRODUODENOSCOPY (EGD);  Surgeon: [name], MD;  Location: [facility] ENDOSCOPY;  Service: Endoscopy;  Laterality: N/A;   ESOPHAGOGASTRODUODENOSCOPY (EGD) WITH PROPOFOL N/A 10/04/2019   Procedure: ESOPHAGOGASTRODUODENOSCOPY (EGD) WITH PROPOFOL;  Surgeon: Lucilla Lame, MD;  Location: [facility] ENDOSCOPY;  Service: Endoscopy;  Laterality: N/A;   HERNIA REPAIR  2015   left groin   INTERCOSTAL NERVE BLOCK Left 11/09/2019   Procedure: Intercostal Nerve Block;  Surgeon: Melrose Nakayama, MD;  Location: [place];  Service: Thoracic;  Laterality: Left;   LUMBAR FUSION     RADIOACTIVE SEED IMPLANT N/A 04/22/2016    Procedure: RADIOACTIVE SEED IMPLANT/BRACHYTHERAPY IMPLANT;  Surgeon: Hollice Espy, MD;  Location: [facility] ORS;  Service: Urology;  Laterality: N/A;   ROBOT ASSISTED INGUINAL HERNIA REPAIR Bilateral 07/06/2018   Procedure: ROBOT ASSISTED INGUINAL HERNIA REPAIR;  Surgeon: Jules Husbands, MD;  Location: [facility] ORS;  Service: General;  Laterality: Bilateral;   TONSILLECTOMY     UMBILICAL  HERNIA REPAIR N/A 07/06/2018   Procedure: LAPAROSCOPIC ROBOT ASSISTED UMBILICAL HERNIA;  Surgeon: Jules Husbands, MD;  Location: [facility] ORS;  Service: General;  Laterality: N/A;   XI ROBOTIC ASSISTED THORACOSCOPY- SEGMENTECTOMY Left 11/09/2019   Procedure: XI ROBOTIC ASSISTED THORACOSCOPY-WEDGE RESECTION LEFT LOWER LOBE;  Surgeon: Melrose Nakayama, MD;  Location: [place];  Service: Thoracic;  Laterality: Left;    Home Medications:  Allergies as of 09/25/2021   No Known Allergies      Medication List        Accurate as of September 25, 2021 11:59 PM. If you have any questions, ask your nurse or doctor.          albuterol (2.5 MG/3ML) 0.083% nebulizer solution Commonly known as: PROVENTIL TAKE 3 MLS BY NEBULIZATION EVERY 4 (FOUR) HOURS AS NEEDED FOR WHEEZING OR SHORTNESS OF BREATH.   albuterol 108 (90 Base) MCG/ACT inhaler Commonly known as: VENTOLIN HFA Inhale 1-2 puffs into the lungs every 6 (six) hours as needed for wheezing or shortness of breath.   ARIPiprazole 10 MG tablet Commonly known as: ABILIFY Take 10 mg by mouth daily.   aspirin EC 81 MG tablet Take 81 mg by mouth daily.   budesonide-formoterol 160-4.5 MCG/ACT inhaler Commonly known as: Symbicort INHALE 2 PUFFS INTO THE LUNGS 2 (TWO) TIMES DAILY. RINSE MOUTH OUT   celecoxib 200 MG capsule Commonly known as: CELEBREX Take 1 capsule (200 mg total) by mouth daily after breakfast.   clopidogrel 75 MG tablet Commonly known as: PLAVIX Take 1 tablet (75 mg total) by mouth daily.   Dextromethorphan-Guaifenesin 60-1200 MG 12hr tablet Take  1 tablet by mouth every 12 (twelve) hours. Prn cough   donepezil 5 MG tablet Commonly known as: Aricept Take 1 tablet (5 mg total) by mouth at bedtime. Further refills per [facility] neurology   FLUoxetine 40 MG capsule Commonly known as: PROZAC Take 40 mg by mouth daily.   fluticasone 50 MCG/ACT nasal spray Commonly known as: FLONASE Place 2 sprays into both nostrils daily. Prn after nasal saline   Gemtesa 75 MG Tabs Generic drug: Vibegron Take 75 mg by mouth daily.   levofloxacin 750 MG tablet Commonly known as: Levaquin Take 1 tablet (750 mg total) by mouth daily. With food   memantine 5 MG tablet Commonly known as: NAMENDA Take 1 tablet (5 mg total) by mouth 2 (two) times daily.   metoprolol succinate 50 MG 24 hr tablet Commonly known as: TOPROL-XL Take 1 tablet (50 mg total) by mouth daily.   multivitamin with minerals tablet Take 1 tablet by mouth daily.   Myrbetriq 50 MG Tb24 tablet Generic drug: mirabegron ER Take by mouth.   nitroGLYCERIN 0.4 MG SL tablet Commonly known as: NITROSTAT Place 1 tablet (0.4 mg total) under the tongue every 5 (five) minutes as needed for chest pain. Maximum of 3 doses   ondansetron 4 MG disintegrating tablet Commonly known as: ZOFRAN-ODT Take 1 tablet (4 mg total) by mouth every 8 (eight) hours as needed for nausea or vomiting.   pantoprazole 40 MG tablet Commonly known as: PROTONIX Take 1 tablet (40 mg total) by mouth daily. 30 min before breakfast   predniSONE 20 MG tablet Commonly known as: DELTASONE Take 2 tablets (40 mg total) by mouth daily with breakfast. In am   rosuvastatin 10 MG tablet Commonly known as: CRESTOR Take 1 tablet (10 mg total) by mouth at bedtime.   sodium chloride 0.65 % Soln nasal spray Commonly known as: Ship broker  2 sprays into both nostrils as needed for congestion.   sucralfate 1 g tablet Commonly known as: CARAFATE TAKE 1 TABLET BY MOUTH FOUR TIMES A DAY   tamsulosin 0.4 MG Caps  capsule Commonly known as: FLOMAX TAKE 1 CAPSULE (0.4 MG TOTAL) BY MOUTH DAILY AFTER SUPPER.   traZODone 100 MG tablet Commonly known as: DESYREL Take by mouth. What changed: Another medication with the same name was removed. Continue taking this medication, and follow the directions you see here. Changed by: Hollice Espy, MD        Allergies: No Known Allergies  Family History: Family History  Problem Relation Age of Onset   Heart disease Father        Unclear details. Father passed in late 41's 2/2 cancer.   Colon cancer Father    Alcohol abuse Sister    Drug abuse Sister    Anxiety disorder Sister    Depression Sister    COPD Brother    Obesity Brother    Kidney disease Neg Hx    Prostate cancer Neg Hx     Social History:  reports that he quit smoking 9 days ago. His smoking use included cigarettes. He started smoking about 59 years ago. He has a 55.00 pack-year smoking history. He has never used smokeless tobacco. He reports current alcohol use of about 1.0 standard drink per week. He reports current drug use. Frequency: 7.00 times per week. Drug: Marijuana.   Physical Exam: BP 122/71    Pulse 70    Ht 6' (1.829 m)    Wt 256 lb (116.1 kg)    BMI 34.72 kg/m   Constitutional:  Alert and oriented, No acute distress. HEENT: [place] AT, moist mucus membranes.  Trachea midline, no masses. Cardiovascular: No clubbing, cyanosis, or edema. Respiratory: Normal respiratory effort, no increased work of breathing. Skin: No rashes, bruises or suspicious lesions. Neurologic: Grossly intact, no focal deficits, moving all 4 extremities. Psychiatric: Normal mood and affect.  Laboratory Data:  Lab Results  Component Value Date   CREATININE 1.12 08/08/2021    Lab Results  Component Value Date   PSA 1.74 10/02/2016    Lab Results  Component Value Date   TESTOSTERONE 134 (L) 07/02/2020    Lab Results  Component Value Date   HGBA1C 5.8 07/18/2021   Pertinent  Imaging: Results for orders placed or performed in visit on 09/25/21  BLADDER SCAN AMB NON-IMAGING  Result Value Ref Range   Scan Result 20     Assessment & Plan:   Bulbar Urethral stricture (radiation/postprocedural) - S/p urethral dilation with Optilume ballon.  - He is emptying adequately today and his urinary symptoms have improved   2. History of prostate cancer    - Will continue annual check of PSA  Return in 6 months for PSA, IPSS, and PVR  I,Tylesha Gibeault,acting as a scribe for Hollice Espy, MD.,have documented all relevant documentation on the behalf of Hollice Espy, MD,as directed by  Hollice Espy, MD while in the presence of Hollice Espy, MD.  I have reviewed the above documentation for accuracy and completeness, and I agree with the above.   Hollice Espy, MD    [facility] [address] [place],  [address] [phone]

## 2021-09-25 NOTE — Patient Instructions (Signed)
Medication Instructions:  ? ?Your physician recommends that you continue on your current medications as directed. Please refer to the Current Medication list given to you today.  ? ?*If you need a refill on your cardiac medications before your next appointment, please call your pharmacy* ? ? ?Lab Work: ?None ordered ? ?If you have labs (blood work) drawn today and your tests are completely normal, you will receive your results only by: ?MyChart Message (if you have MyChart) OR ?A paper copy in the mail ?If you have any lab test that is abnormal or we need to change your treatment, we will call you to review the results. ? ? ?Testing/Procedures: ?None ordered ? ? ?Follow-Up: ?At [facility], you and your health needs are our priority.  As part of our continuing mission to provide you with exceptional heart care, we have created designated Provider Care Teams.  These Care Teams include your primary Cardiologist (physician) and Advanced Practice Providers (APPs -  Physician Assistants and Nurse Practitioners) who all work together to provide you with the care you need, when you need it. ? ?We recommend signing up for the patient portal called "MyChart".  Sign up information is provided on this After Visit Summary.  MyChart is used to connect with patients for Virtual Visits (Telemedicine).  Patients are able to view lab/test results, encounter notes, upcoming appointments, etc.  Non-urgent messages can be sent to your provider as well.   ?To learn more about what you can do with MyChart, go to [email or web address].   ? ?Your next appointment:   ?6 month(s) ? ?The format for your next appointment:   ?In Person ? ?Provider:   ?You may see Kathlyn Sacramento, MD or one of the following Advanced Practice Providers on your designated Care Team:   ?Murray Hodgkins, NP ?Christell Faith, PA-C ?Cadence Kathlen Mody, PA-C ? ? ?Other Instructions ?N/A ?

## 2021-09-25 NOTE — Progress Notes (Signed)
Cardiology Office Note:    Date:  09/25/2021   ID:  MARSH HECKLER, DOB [birth year], MRN [id number]  PCP:  McLean-Scocuzza, [name], MD  [facility] Cardiologist:  Kathlyn Sacramento, MD  [facility] Electrophysiologist:  Vickie Epley, MD   Referring MD: McLean-Scocuzza, Olivia Mackie *   Chief Complaint: 6 month follow-up  History of Present Illness:    Christopher Orr is a 76 y.o. male with a hx of CAD s/p LAD stenting in 2006 it Cypher DES, tobacco/marijuana, alcohol use, COPD, HLD, obesity, and hepatitis C (treated), mild dementia who presents for follow-up.    Repeat cath in 2011 showed patent stent with mild restenosis. CT in 2019 that showed no aortic aneuryms. Patient was diagnosed with squamous cell cancer of the lung in 2021  and underwent robotic assisted wedge resection. He did not require chemo or radiation. Plans to follow up with oncology. Patient was seen 02/2020 and was stable from a cardiac perspective. Plan to indefinitley continue DAPT with Aspirin and plavix.    PCP note and he recorded a syncope event. 2 months ago the patient passed out at home. He Got up and fell out on the floor. Went to the ER but work-up was unremarkable. Still drinking alcohol daily. Also smoking tobacco and weed. Orthostatics today negative.    Syncopal episode 12/2020 and went to the ER. Syncope in the setting of ETOH use, poor PO intake, narco and benzo use, dehydration, cirrhosis, and Flomax use. Episode sounded orthostatic in nature. ER work-up was unremarkable. Referred by PCP for dizziness and seen 12/21/20 Toprol was decreased. Lasix was discontinued. Echo was obtained which LVEF 50-55%, G1DD and no significant valvular abnormalities.    Seen 03/13/21 and was still drinking alcohol, son had recently passed away.orthostatics negative. Heart monitor was ordered.    Seen 05/16/21 and Heart monitor showed 7.8%PAC burden. Unsure if he is taking BB. Toprol was restarted and 1 week heart monitor was  ordered. Repeat monitor showed 8% PAC burden. Toprol was increased  Last seen 05/2021 and it was unclear if he was taking the BB at prescribed dose. Still drinking alcohol. He was referred to EP.   He saw EP 06/2021 and continued BB use was recommended  Today, the patient reports he has been doing well since the last visit. No chest pain. He has chronic shortness of breath that is unchanged. No LLE, orthopnea, pnd. No palpitations.  BP borderline soft, no dizziness or lightheadedness. Labs last month reviewed.   Past Medical History:  Diagnosis Date   Alcohol abuse    pt states it is marijuana   Anemia    Arthritis    Bipolar disorder (HCC)    BPH (benign prostatic hypertrophy) with urinary obstruction    CAD (coronary artery disease)    a. 2006 PCI/DES to LAD, EF 60%.   Chronic heart failure with preserved ejection fraction (HFpEF) ([place])    a. 07/2014 Echo: EF 55%; b. 11/2020 Echo: EF 50-55%, mod LVH, GrI DD, mildly enlarged RV w/ nl fxn, mild BAE.   Chronic pancreatitis (HCC)    Cirrhosis ([place])    COPD (chronic obstructive pulmonary disease) ([place])    COVID-19    08/22/20   Dementia ([place])    early dementia   Depression    Dilated ascending aorta and aortic root ([place])    a. 11/2020 Echo: Ao root 46mm, Asc Ao 32mm. Ao arch 21mm.   Diverticulitis    12/06/20 [facility]  Diverticulosis    Drug use    Dyspnea    ED (erectile dysfunction)    Gastritis    GERD (gastroesophageal reflux disease)    Headache    occasional migraines   Hepatitis C    treated   History of lumbar fusion    Hyperlipidemia    Hypertension    patient denies having high blood pressure   Lung cancer (HCC)    Pancreatitis    x 2    Pneumonia    07/06/18    Premature atrial contractions    a. 04/2021 Zio: Freq PACs w/ 8% burden.   Prostate cancer [facility])    with seeds   PSVT (paroxysmal supraventricular tachycardia) ([place])    a. 04/2021 Zio: Avg HR 80 (60-210). 42 SVT runs - longest 5 beats @ 116, fastest  210 x 5 beats. Freq PACs (8%).   PTSD (post-traumatic stress disorder)    Rib fracture    s/p fall 03/15/19    Sinus disease    Urge incontinence     Past Surgical History:  Procedure Laterality Date   CARDIAC CATHETERIZATION  2006   cateract  2013   COLONOSCOPY WITH PROPOFOL N/A 10/04/2019   Procedure: COLONOSCOPY WITH PROPOFOL;  Surgeon: Lucilla Lame, MD;  Location[facility] ENDOSCOPY;  Service: Endoscopy;  Laterality: N/A;   CORONARY ANGIOPLASTY  2006   CORONARY STENT PLACEMENT  2006   x 1   CYSTOSCOPY W/ RETROGRADES N/A 08/08/2021   Procedure: CYSTOSCOPY WITH RETROGRADE PYELOGRAM;  Surgeon: Hollice Espy, MD;  Location: [facility] ORS;  Service: Urology;  Laterality: N/A;   CYSTOSCOPY WITH URETHRAL DILATATION N/A 08/08/2021   Procedure: CYSTOSCOPY WITH URETHRAL DILATATION WITH UROLUME BALLOON;  Surgeon: Hollice Espy, MD;  Location: [facility] ORS;  Service: Urology;  Laterality: N/A;   ESOPHAGOGASTRODUODENOSCOPY N/A 12/22/2014   Procedure: ESOPHAGOGASTRODUODENOSCOPY (EGD);  Surgeon: [name], MD;  Location: [facility] ENDOSCOPY;  Service: Endoscopy;  Laterality: N/A;   ESOPHAGOGASTRODUODENOSCOPY (EGD) WITH PROPOFOL N/A 10/04/2019   Procedure: ESOPHAGOGASTRODUODENOSCOPY (EGD) WITH PROPOFOL;  Surgeon: Lucilla Lame, MD;  Location: [facility] ENDOSCOPY;  Service: Endoscopy;  Laterality: N/A;   HERNIA REPAIR  2015   left groin   INTERCOSTAL NERVE BLOCK Left 11/09/2019   Procedure: Intercostal Nerve Block;  Surgeon: Melrose Nakayama, MD;  Location: [place];  Service: Thoracic;  Laterality: Left;   LUMBAR FUSION     RADIOACTIVE SEED IMPLANT N/A 04/22/2016   Procedure: RADIOACTIVE SEED IMPLANT/BRACHYTHERAPY IMPLANT;  Surgeon: Hollice Espy, MD;  Location: [facility] ORS;  Service: Urology;  Laterality: N/A;   ROBOT ASSISTED INGUINAL HERNIA REPAIR Bilateral 07/06/2018   Procedure: ROBOT ASSISTED INGUINAL HERNIA REPAIR;  Surgeon: Jules Husbands, MD;  Location: [facility] ORS;  Service: General;  Laterality: Bilateral;    TONSILLECTOMY     UMBILICAL HERNIA REPAIR N/A 07/06/2018   Procedure: LAPAROSCOPIC ROBOT ASSISTED UMBILICAL HERNIA;  Surgeon: Jules Husbands, MD;  Location: [facility] ORS;  Service: General;  Laterality: N/A;   XI ROBOTIC ASSISTED THORACOSCOPY- SEGMENTECTOMY Left 11/09/2019   Procedure: XI ROBOTIC ASSISTED THORACOSCOPY-WEDGE RESECTION LEFT LOWER LOBE;  Surgeon: Melrose Nakayama, MD;  Location: [facility] OR;  Service: Thoracic;  Laterality: Left;    Current Medications: Current Meds  Medication Sig   albuterol (PROVENTIL) (2.5 MG/3ML) 0.083% nebulizer solution TAKE 3 MLS BY NEBULIZATION EVERY 4 (FOUR) HOURS AS NEEDED FOR WHEEZING OR SHORTNESS OF BREATH.   albuterol (VENTOLIN HFA) 108 (90 Base) MCG/ACT inhaler Inhale 1-2 puffs into the lungs every 6 (six) hours as  needed for wheezing or shortness of breath.   ARIPiprazole (ABILIFY) 10 MG tablet Take 10 mg by mouth daily.   aspirin EC 81 MG tablet Take 81 mg by mouth daily.   budesonide-formoterol (SYMBICORT) 160-4.5 MCG/ACT inhaler INHALE 2 PUFFS INTO THE LUNGS 2 (TWO) TIMES DAILY. RINSE MOUTH OUT   celecoxib (CELEBREX) 200 MG capsule Take 1 capsule (200 mg total) by mouth daily after breakfast.   clopidogrel (PLAVIX) 75 MG tablet Take 1 tablet (75 mg total) by mouth daily.   Dextromethorphan-Guaifenesin 60-1200 MG 12hr tablet Take 1 tablet by mouth every 12 (twelve) hours. Prn cough   donepezil (ARICEPT) 5 MG tablet Take 1 tablet (5 mg total) by mouth at bedtime. Further refills per [facility] neurology   FLUoxetine (PROZAC) 40 MG capsule Take 40 mg by mouth daily.   fluticasone (FLONASE) 50 MCG/ACT nasal spray Place 2 sprays into both nostrils daily. Prn after nasal saline   levofloxacin (LEVAQUIN) 750 MG tablet Take 1 tablet (750 mg total) by mouth daily. With food   memantine (NAMENDA) 5 MG tablet Take 1 tablet (5 mg total) by mouth 2 (two) times daily.   metoprolol succinate (TOPROL-XL) 50 MG 24 hr tablet Take 1 tablet (50 mg total) by mouth daily.    Multiple Vitamins-Minerals (MULTIVITAMIN WITH MINERALS) tablet Take 1 tablet by mouth daily.   MYRBETRIQ 50 MG TB24 tablet Take by mouth.   nitroGLYCERIN (NITROSTAT) 0.4 MG SL tablet Place 1 tablet (0.4 mg total) under the tongue every 5 (five) minutes as needed for chest pain. Maximum of 3 doses   ondansetron (ZOFRAN-ODT) 4 MG disintegrating tablet Take 1 tablet (4 mg total) by mouth every 8 (eight) hours as needed for nausea or vomiting.   pantoprazole (PROTONIX) 40 MG tablet Take 1 tablet (40 mg total) by mouth daily. 30 min before breakfast   predniSONE (DELTASONE) 20 MG tablet Take 2 tablets (40 mg total) by mouth daily with breakfast. In am   rosuvastatin (CRESTOR) 10 MG tablet Take 1 tablet (10 mg total) by mouth at bedtime.   sodium chloride (OCEAN) 0.65 % SOLN nasal spray Place 2 sprays into both nostrils as needed for congestion.   sucralfate (CARAFATE) 1 g tablet TAKE 1 TABLET BY MOUTH FOUR TIMES A DAY   tamsulosin (FLOMAX) 0.4 MG CAPS capsule TAKE 1 CAPSULE (0.4 MG TOTAL) BY MOUTH DAILY AFTER SUPPER.   traZODone (DESYREL) 150 MG tablet Take 1 tablet (150 mg total) by mouth at bedtime.   Vibegron (GEMTESA) 75 MG TABS Take 75 mg by mouth daily.     Allergies:   Patient has no known allergies.   Social History   Socioeconomic History   Marital status: Divorced    Spouse name: Not on file   Number of children: 4   Years of education: Not on file   Highest education level: GED or equivalent  Occupational History   Occupation: Aeronautical engineer    Comment: retired  Tobacco Use   Smoking status: Former    Packs/day: 1.00    Years: 3.00    Pack years: 3.00    Types: Cigarettes    Start date: 07/28/1962    Quit date: 09/21/2021    Years since quitting: 0.0   Smokeless tobacco: Never   Tobacco comments:    started back in march. 1 pack a day.  Vaping Use   Vaping Use: Never used  Substance and Sexual Activity   Alcohol use: Yes    Alcohol/week: 1.0 standard  drink     Types: 1 Cans of beer per week    Comment: 2-3 beers a day   Drug use: Yes    Frequency: 7.0 times per week    Types: Marijuana    Comment: Endorsed heroin 06/2014, UDS also + benzos, opiates, THC, neg for cocaine at that time.   Sexual activity: Not Currently  Other Topics Concern   Not on file  Social History Narrative   Lives at home    Has kids and grandkids   Smoker x 55 years 1 ppd as of 07/2019 he did quit x 2 years    Drinks 1 pt liq qd as of 07/2019    Social Determinants of Health   Financial Resource Strain: Low Risk    Difficulty of Paying Living Expenses: Not hard at all  Food Insecurity: No Food Insecurity   Worried About Charity fundraiser in the Last Year: Never true   Arboriculturist in the Last Year: Never true  Transportation Needs: No Transportation Needs   Lack of Transportation (Medical): No   Lack of Transportation (Non-Medical): No  Physical Activity: Not on file  Stress: No Stress Concern Present   Feeling of Stress : Not at all  Social Connections: Moderately Isolated   Frequency of Communication with Friends and Family: More than three times a week   Frequency of Social Gatherings with Friends and Family: Once a week   Attends Religious Services: Never   Marine scientist or Organizations: Yes   Attends Music therapist: More than 4 times per year   Marital Status: Divorced     Family History: The patient's family history includes Alcohol abuse in his sister; Anxiety disorder in his sister; COPD in his brother; Colon cancer in his father; Depression in his sister; Drug abuse in his sister; Heart disease in his father; Obesity in his brother. There is no history of Kidney disease or Prostate cancer.  ROS:   Please see the history of present illness.     All other systems reviewed and are negative.  EKGs/Labs/Other Studies Reviewed:    The following studies were reviewed today: Echo 12/18/20  1. Left ventricular ejection  fraction, by estimation, is 50 to 55%. The  left ventricle has low normal function. Left ventricular endocardial  border not optimally defined to evaluate regional wall motion. There is  moderate left ventricular hypertrophy.  Left ventricular diastolic parameters are consistent with Grade I  diastolic dysfunction (impaired relaxation). The average left ventricular  global longitudinal strain is -16.5 %.   2. Right ventricular systolic function is normal. The right ventricular  size is mildly enlarged.   3. Left atrial size was mildly dilated.   4. Right atrial size was mildly dilated.   5. The mitral valve was not well visualized. No evidence of mitral valve  regurgitation. No evidence of mitral stenosis.   6. The aortic valve is tricuspid. Aortic valve regurgitation is not  visualized. No aortic stenosis is present.   7. Aortic dilatation noted. There is mild dilatation of the aortic root,  measuring 44 mm. There is mild dilatation of the ascending aorta,  measuring 40 mm. There is mild dilatation of the aortic arch, measuring 37  mm.   Comparison(s): EF 60-65%, RVSP 34.8 mmHg, mild to moderate TR.    Long term heart monitor 05/2021 Patch Wear Time:  7 days and 19 hours (2022-10-20T14:16:09-398 to 2022-10-28T09:32:13-0400)   Patient had a min  HR of 60 bpm, max HR of 210 bpm, and avg HR of 80 bpm.  Predominant underlying rhythm was Sinus Rhythm.  42 Supraventricular Tachycardia runs occurred, the run with the fastest interval lasting 5 beats with a max rate of 210 bpm, the longest lasting 5 beats with an avg rate of 116 bpm. Frequent PACs with a burden of 8% Rare PVCs.  Heart monitor 03/2021 Patch Wear Time:  13 days and 18 hours (2022-08-17T15:47:07-398 to 2022-08-31T10:34:20-0400)   Patient had a min HR of 51 bpm, max HR of 203 bpm, and avg HR of 71 bpm.  Predominant underlying rhythm was Sinus Rhythm.  25 Supraventricular Tachycardia runs occurred, the run with the fastest  interval lasting 4 beats with a max rate of 203 bpm, the longest lasting 14 beats with an avg rate of 147 bpm. Frequent PACs with a burden of 7.8%. Rare PVCs.  EKG:  EKG is  ordered today.  The ekg ordered today demonstrates NSR 65bpm, LAD,nonspecific T wave changes III, LAFB  Recent Labs: 10/30/2020: Magnesium 2.0 07/18/2021: TSH 0.51 08/08/2021: ALT 19; BUN 18; Creatinine, Ser 1.12; Hemoglobin 12.4; Platelets 247; Potassium 4.0; Sodium 134  Recent Lipid Panel    Component Value Date/Time   CHOL 98 07/18/2021 1043   TRIG 146.0 07/18/2021 1043   HDL 43.80 07/18/2021 1043   CHOLHDL 2 07/18/2021 1043   VLDL 29.2 07/18/2021 1043   LDLCALC 25 07/18/2021 1043     Physical Exam:    VS:  BP 100/68 (BP Location: Left Arm, Patient Position: Sitting, Cuff Size: Normal)    Pulse 65    Ht 6' (1.829 m)    Wt 256 lb 2 oz (116.2 kg)    SpO2 94%    BMI 34.74 kg/m     Wt Readings from Last 3 Encounters:  09/25/21 256 lb 2 oz (116.2 kg)  09/18/21 265 lb (120.2 kg)  08/08/21 255 lb (115.7 kg)     GEN:  Well nourished, well developed in no acute distress HEENT: Normal NECK: No JVD; No carotid bruits LYMPHATICS: No lymphadenopathy CARDIAC: RRR, no murmurs, rubs, gallops RESPIRATORY:  Clear to auscultation without rales, wheezing or rhonchi  ABDOMEN: Soft, non-tender, non-distended MUSCULOSKELETAL:  No edema; No deformity  SKIN: Warm and dry NEUROLOGIC:  Alert and oriented x 3 PSYCHIATRIC:  Normal affect   ASSESSMENT:    1. Coronary artery disease involving native coronary artery of native heart without angina pectoris   2. Chronic diastolic heart failure ([place])   3. Hyperlipidemia, mixed   4. PAC (premature atrial contraction)   5. Polysubstance abuse ([place])    PLAN:    In order of problems listed above:  CAD s/p DES to LAD in 2006 Patient denies chest pain, he has chronic SOB that is unchanged. EKG stable. No further work-up indicated at this time. EKG with no new changes. Continue  indefinite DAPT with Aspirin and Plavix. Continue Crestor and Toprol.   HFpEF Echo 11/2020 showed LVEF 50-55%, G1DD, mildly enlarged RV function, mild dilation of the aortic root. He is euvolemic on exam. Continue BB.   HLD LDL 25 in 06/2021. Continue Crestor and fish oil.   PACs Prior heart monitor showed 8% burden, patient is asymptomatic. EKG with no PACs today. Continue Toprol-XL 50mg  daily.   Polysubstance abuse He is still drinking alcohol 3-4 beers nightly, doesn't want to decrease. He quit smoking 4 days ago.   Disposition: Follow up in 6 month(s) with MD/APP  Signed, Marrah Vanevery Ninfa Meeker, PA-C  09/25/2021 1:16 PM    [facility]

## 2021-09-26 ENCOUNTER — Encounter: Payer: Self-pay | Admitting: Pulmonary Disease

## 2021-09-26 ENCOUNTER — Ambulatory Visit: Payer: Medicare HMO | Admitting: Pulmonary Disease

## 2021-09-26 ENCOUNTER — Telehealth: Payer: Self-pay

## 2021-09-26 VITALS — BP 110/60 | HR 74 | Temp 98.8°F | Ht 72.0 in | Wt 261.2 lb

## 2021-09-26 DIAGNOSIS — J449 Chronic obstructive pulmonary disease, unspecified: Secondary | ICD-10-CM | POA: Diagnosis not present

## 2021-09-26 DIAGNOSIS — R0609 Other forms of dyspnea: Secondary | ICD-10-CM | POA: Diagnosis not present

## 2021-09-26 DIAGNOSIS — C3492 Malignant neoplasm of unspecified part of left bronchus or lung: Secondary | ICD-10-CM

## 2021-09-26 DIAGNOSIS — F17210 Nicotine dependence, cigarettes, uncomplicated: Secondary | ICD-10-CM

## 2021-09-26 MED ORDER — NICOTINE 7 MG/24HR TD PT24: 7.0000 mg | MEDICATED_PATCH | Freq: Every day | TRANSDERMAL | 0 refills | Status: DC

## 2021-09-26 MED ORDER — BREZTRI AEROSPHERE 160-9-4.8 MCG/ACT IN AERO: 2.0000 | INHALATION_SPRAY | Freq: Two times a day (BID) | RESPIRATORY_TRACT | 0 refills | Status: DC

## 2021-09-26 MED ORDER — ALBUTEROL SULFATE (2.5 MG/3ML) 0.083% IN NEBU: 2.5000 mg | INHALATION_SOLUTION | Freq: Four times a day (QID) | RESPIRATORY_TRACT | 3 refills | Status: DC | PRN

## 2021-09-26 MED ORDER — BREZTRI AEROSPHERE 160-9-4.8 MCG/ACT IN AERO: 2.0000 | INHALATION_SPRAY | Freq: Two times a day (BID) | RESPIRATORY_TRACT | 11 refills | Status: DC

## 2021-09-26 MED ORDER — NICOTINE 14 MG/24HR TD PT24: 14.0000 mg | MEDICATED_PATCH | TRANSDERMAL | 0 refills | Status: AC

## 2021-09-26 MED ORDER — NICOTINE 21 MG/24HR TD PT24: 21.0000 mg | MEDICATED_PATCH | Freq: Every day | TRANSDERMAL | 0 refills | Status: DC

## 2021-09-26 NOTE — Patient Instructions (Addendum)
We are going to change Symbicort to [employer] 2 puffs twice a day.  Make sure you rinse your mouth well after you use it. ? ?STOP SYMBICORT as he will be on Breztri from here on. ? ?We have refilled your albuterol nebulizer solution. ? ?We have written for a prescription for patches. ? ?We will schedule you for breathing tests. ? ?We will do a heart test to check your heart as well. ? ?We are ordering an oxygen level at nighttime. ? ?We will see you in follow-up in 4 to 6 weeks time call sooner should any new problems arise. ?

## 2021-09-26 NOTE — Progress Notes (Signed)
Subjective:    Patient ID: Christopher Orr, male    DOB: 1946-01-25, 76 y.o.   MRN: [id number] Patient Care Team: McLean-Scocuzza, [name], MD as PCP - General (Internal Medicine) Wellington Hampshire, MD as PCP - Cardiology (Cardiology) Vickie Epley, MD as PCP - Electrophysiology (Cardiology) Telford Nab, RN as Oncology Nurse Navigator  Chief Complaint  Patient presents with   Follow-up   HPI Patient is a 76 year old current smoker (1 PPD, 31 PY) who presents for follow-up on the issue of COPD and shortness of breath.  This is a scheduled visit.  He has a history of not following up with schedule appointments.  Last seen on 29 October 2020, at that time instructed to follow in 3 to 4 weeks time and failed to do so.  Higher to that had been seen in June 2021 so missing follow-up appointment after that.  Patient has a history of stopping and starting smoking.  He has noted increasing shortness of breath over the last month which also coincides with him relapsing into smoking 1 pack of cigarettes per day.  He continues to have very poor insight as to the cause and effect of his shortness of breath.  He also has significant coronary artery disease as well which could also be the culprit for his shortness of breath.  Patient has mild cognitive impairment and as noted above has very poor insight as to his disease process.  We tried to explain things to him again today.  He has noted increasing shortness of breath since he started smoking again after a brief hiatus of less than a month.  He has chronic morning cough that is productive of whitish to grayish sputum with no hemoptysis.  He does not describe any orthopnea or paroxysmal nocturnal dyspnea.  No lower extremity edema.  Does not have any fevers, chills or sweats.  No chest pain.  On a prior visit he was ordered overnight oximetry which he did not done as requested.  DATA 08 September 2019 preoperative PFTs: FEV1 2.43 L or 71% predicted.   FVC 3.88 L or 83% predicted.  FEV1/FVC 63%.  No bronchodilator response.  Lung volumes normal.  Diffusion capacity 53%.  Insistent with moderate obstructive defect, diffusion capacity decreased suggestive of emphysema 27 December 2019 ambulatory oximetry: No desaturation with exercise oxygen saturations remained 95 to 96% 17 May 2020 CT chest: Status post wedge resection left lower lobe pulmonary nodule.  Irregular parenchymal density surrounding the clips, no evidence of metastatic disease elsewhere, small loculated pleural effusion, attention on follow-up recommended 05 November 2020 FTs: FEV1 2.0 L or 59% predicted, FVC 3.58 L or 77% predicted, FEV1/FVC 56%, there was significant bronchodilator response with 14% net change postbronchodilator on FEV1.  Mild air trapping.  No hyperinflation.  Diffusion capacity moderately reduced.  Consistent with moderate COPD on the basis of emphysema with airways reversibility 12 Dec 2020 echocardiogram: LVEF 50 to 55%, LV with low normal function.  Grade I DD.  Mildly enlarged RV.  Mildly dilated LV and RV.  Mild  dilatation of the aortic root 13 June 2021 CT chest without contrast: Moderate centrilobular and paraseptal emphysema, diffuse bronchial thickening status post left lower lobe wedge resection with nodular scar tissue, subpleural round atelectasis about lateral left lower lobe and lingula, no evidence of recurrence.  Incidental finding of coronary artery disease (three-vessel coronary artery calcifications and stents)  Review of Systems A 10 point review of systems was performed and it is  as noted above otherwise negative.   Patient Active Problem List   Diagnosis Date Noted   Right ear pain 07/18/2021   Viral upper respiratory tract infection 07/18/2021   Alcohol-induced mood disorder ([place]) 06/30/2021   Alcohol intoxication ([place]) 06/30/2021   Bipolar disorder ([place]) 01/29/2021   Dizziness 01/29/2021   Syncope 01/29/2021   Sinus bradycardia  01/29/2021   Tetrahydrocannabinol (THC) use disorder, mild, abuse 01/29/2021   Other chronic pain 01/29/2021   Internal hemorrhoids 01/29/2021   Syncope due to orthostatic hypotension 01/23/2021   Chronic bilateral low back pain without sciatica 12/27/2020   S/P lumbar fusion 12/27/2020   Alcohol abuse 11/01/2020   Malignant neoplasm of lower lobe of left lung ([place]) 05/15/2020   SOB (shortness of breath) on exertion 05/15/2020   Late onset Alzheimer's disease without behavioral disturbance ([place]) 05/07/2020   Squamous carcinoma of lung, left ([place]) 11/26/2019   Goals of care, counseling/discussion 11/26/2019   Nodule of lower lobe of left lung 11/09/2019   Lung cancer ([place]) 11/01/2019   Abnormal findings on diagnostic imaging of digestive system    Polyp of transverse colon    Prediabetes 09/14/2019   Kidney lesion 08/15/2019   History of prostate cancer 08/15/2019   Arthritis 08/15/2019   Hernia, inguinal, bilateral 08/15/2019   History of pancreatitis 08/15/2019   Cirrhosis of liver ([place]) 08/15/2019   Insomnia 08/15/2019   Memory loss 08/15/2019   Nodule of middle lobe of right lung 08/15/2019   Tobacco abuse 08/15/2019   CAD (coronary artery disease)    Degenerative disc disease, lumbar 11/01/2018   Cigarette smoker 08/05/2018   Acute respiratory failure with hypoxia ([place]) 07/06/2018   Bilateral inguinal hernia, without obstruction or gangrene, recurrent    Umbilical hernia without obstruction and without gangrene    Panic attacks 06/14/2018   Benzodiazepine dependence ([place]) 06/14/2018   Post traumatic stress disorder (PTSD) 06/14/2018   Depression, recurrent ([place]) 06/14/2018   Coronary artery disease involving native heart with angina pectoris ([place]) 05/31/2018   Osteoarthritis of multiple joints 05/31/2018   Hyperlipidemia 05/31/2018   Chronic viral hepatitis C ([place]) 09/24/2014   Chest pain 07/28/2014   COPD (chronic obstructive pulmonary disease) ([place]) 07/28/2014    Anxiety and depression 07/28/2014   Aspiration pneumonia ([place]) 07/28/2014   Elevated troponin I level 07/28/2014   Acute kidney injury ([place]) 07/28/2014   GERD (gastroesophageal reflux disease) 07/28/2014   Social History   Tobacco Use   Smoking status: Former    Packs/day: 1.00    Years: 55.00    Pack years: 55.00    Types: Cigarettes    Start date: 07/28/1962    Quit date: 09/21/2021    Years since quitting: 0.0   Smokeless tobacco: Never   Tobacco comments:    Quit smoking 09/21/21 - 09/26/2021 *suspect he is still smoking*  Substance Use Topics   Alcohol use: Yes    Alcohol/week: 1.0 standard drink    Types: 1 Cans of beer per week    Comment: 2-3 beers a day   No Known Allergies  Current Meds  Medication Sig   albuterol (PROVENTIL) (2.5 MG/3ML) 0.083% nebulizer solution TAKE 3 MLS BY NEBULIZATION EVERY 4 (FOUR) HOURS AS NEEDED FOR WHEEZING OR SHORTNESS OF BREATH.   albuterol (VENTOLIN HFA) 108 (90 Base) MCG/ACT inhaler Inhale 1-2 puffs into the lungs every 6 (six) hours as needed for wheezing or shortness of breath.   ARIPiprazole (ABILIFY) 10 MG tablet Take 10 mg by mouth daily.  aspirin EC 81 MG tablet Take 81 mg by mouth daily.   budesonide-formoterol (SYMBICORT) 160-4.5 MCG/ACT inhaler INHALE 2 PUFFS INTO THE LUNGS 2 (TWO) TIMES DAILY. RINSE MOUTH OUT   celecoxib (CELEBREX) 200 MG capsule Take 1 capsule (200 mg total) by mouth daily after breakfast.   clopidogrel (PLAVIX) 75 MG tablet Take 1 tablet (75 mg total) by mouth daily.   Dextromethorphan-Guaifenesin 60-1200 MG 12hr tablet Take 1 tablet by mouth every 12 (twelve) hours. Prn cough   donepezil (ARICEPT) 5 MG tablet Take 1 tablet (5 mg total) by mouth at bedtime. Further refills per [facility] neurology   FLUoxetine (PROZAC) 40 MG capsule Take 40 mg by mouth daily.   fluticasone (FLONASE) 50 MCG/ACT nasal spray Place 2 sprays into both nostrils daily. Prn after nasal saline   levofloxacin (LEVAQUIN) 750 MG tablet Take 1  tablet (750 mg total) by mouth daily. With food   memantine (NAMENDA) 5 MG tablet Take 1 tablet (5 mg total) by mouth 2 (two) times daily.   metoprolol succinate (TOPROL-XL) 50 MG 24 hr tablet Take 1 tablet (50 mg total) by mouth daily.   Multiple Vitamins-Minerals (MULTIVITAMIN WITH MINERALS) tablet Take 1 tablet by mouth daily.   MYRBETRIQ 50 MG TB24 tablet Take by mouth.   nitroGLYCERIN (NITROSTAT) 0.4 MG SL tablet Place 1 tablet (0.4 mg total) under the tongue every 5 (five) minutes as needed for chest pain. Maximum of 3 doses   ondansetron (ZOFRAN-ODT) 4 MG disintegrating tablet Take 1 tablet (4 mg total) by mouth every 8 (eight) hours as needed for nausea or vomiting.   pantoprazole (PROTONIX) 40 MG tablet Take 1 tablet (40 mg total) by mouth daily. 30 min before breakfast   predniSONE (DELTASONE) 20 MG tablet Take 2 tablets (40 mg total) by mouth daily with breakfast. In am   rosuvastatin (CRESTOR) 10 MG tablet Take 1 tablet (10 mg total) by mouth at bedtime.   sodium chloride (OCEAN) 0.65 % SOLN nasal spray Place 2 sprays into both nostrils as needed for congestion.   sucralfate (CARAFATE) 1 g tablet TAKE 1 TABLET BY MOUTH FOUR TIMES A DAY   tamsulosin (FLOMAX) 0.4 MG CAPS capsule TAKE 1 CAPSULE (0.4 MG TOTAL) BY MOUTH DAILY AFTER SUPPER.   traZODone (DESYREL) 100 MG tablet Take by mouth.   Vibegron (GEMTESA) 75 MG TABS Take 75 mg by mouth daily.   Immunization History  Administered Date(s) Administered   Fluad Quad(high Dose 65+) 05/04/2019   Influenza, High Dose Seasonal PF 04/15/2019, 05/10/2020   Influenza,inj,Quad PF,6+ Mos 05/04/2018   Influenza-Unspecified 04/27/2021   PFIZER(Purple Top)SARS-COV-2 Vaccination 09/28/2019, 10/19/2019, 05/10/2020   Pneumococcal Conjugate-13 04/15/2019      Objective:   Physical Exam BP 110/60 (BP Location: Left Arm, Patient Position: Sitting, Cuff Size: Normal)    Pulse 74    Temp 98.8 F (37.1 C) (Oral)    Ht 6' (1.829 m)    Wt 261 lb 3.2 oz  (118.5 kg)    SpO2 93%    BMI 35.43 kg/m GENERAL: Obese gentleman, no acute respiratory distress.  Fully ambulatory.  Smells heavily of tobacco.  Rudy complexion, no respiratory distress  HEAD: Normocephalic, atraumatic.  EYES: Pupils equal, round, reactive to light.  No scleral icterus.  MOUTH: Nose/mouth/throat not examined due to masking requirements for COVID 19. NECK: Supple.  Thick neck.  No thyromegaly. Trachea midline. No JVD.  No adenopathy. PULMONARY: Good air entry bilaterally.  Scattered rhonchi and wheezes throughout.  CARDIOVASCULAR: S1 and  S2. Regular rate and rhythm.  No rubs murmurs or gallops heard. GASTROINTESTINAL: Obese, otherwise, benign. MUSCULOSKELETAL: No joint deformity, no clubbing, no edema.  NEUROLOGIC: No overt focal deficits. Speech is fluent.  No gait disturbance noticed on ambulation. SKIN: Intact,warm,dry. Nicotine stained fingers on right hand. PSYCH: Normal mood and affect.  Seems befuddled at times.   Ambulatory oximetry was performed today: Oxygen saturation at rest was 95%, patient did not desaturate past 94% throughout the ambulation.  Had significant dyspnea not correlating with oxygen saturations.  Heart rate response to exercise was somewhat blunted.    Assessment & Plan:     ICD-10-CM   1. Dyspnea on exertion  R06.09 Pulmonary Function Test [facility] Only    ECHOCARDIOGRAM COMPLETE   Likely multifactorial COPD, diastolic dysfunction, CAD Obesity/deconditioning also playing a part Ongoing tobacco use    2. Stage 2 moderate COPD by GOLD classification (HCC)  J44.9 albuterol (PROVENTIL) (2.5 MG/3ML) 0.083% nebulizer solution    Pulmonary Function Test [facility] Only    ECHOCARDIOGRAM COMPLETE    Pulse oximetry, overnight   Recheck PFTs Change Symbicort to Breztri 2 puffs twice a day Albuterol nebulizer solution for as needed use QUIT SMOKING    3. Squamous carcinoma of lung, left [facility])  C34.92 Pulmonary Function Test [facility] Only    ECHOCARDIOGRAM  COMPLETE   No evidence of recurrence as of 05/2021 CT Follows with medical oncology, Dr. Janese Banks    4. Tobacco dependence due to cigarettes  F17.210 Pulmonary Function Test [facility] Only    ECHOCARDIOGRAM COMPLETE   Patient counseled regards to discontinuation of smoking Requested patches, prescriptions written Instructed on proper use of nicotine replacement patch use     Orders Placed This Encounter  Procedures   Pulmonary Function Test [facility] Only    Standing Status:   Future    Standing Expiration Date:   09/27/2022    Order Specific Question:   Full PFT: includes the following: basic spirometry, spirometry pre & post bronchodilator, diffusion capacity (DLCO), lung volumes    Answer:   Full PFT    Order Specific Question:   This test can only be performed at    Answer:   [facility]   Pulse oximetry, overnight    ON RA    Standing Status:   Future    Standing Expiration Date:   09/27/2022   ECHOCARDIOGRAM COMPLETE    Standing Status:   Future    Standing Expiration Date:   03/29/2022    Order Specific Question:   Where should this test be performed    Answer:   [facility]    Order Specific Question:   Perflutren DEFINITY (image enhancing agent) should be administered unless hypersensitivity or allergy exist    Answer:   Administer Perflutren    Order Specific Question:   Reason for exam-Echo    Answer:   SBE I33.9   Meds ordered this encounter  Medications   Budeson-Glycopyrrol-Formoterol (BREZTRI AEROSPHERE) 160-9-4.8 MCG/ACT AERO    Sig: Inhale 2 puffs into the lungs in the morning and at bedtime.    Dispense:  10.7 g    Refill:  11   albuterol (PROVENTIL) (2.5 MG/3ML) 0.083% nebulizer solution    Sig: Take 3 mLs (2.5 mg total) by nebulization every 6 (six) hours as needed for wheezing or shortness of breath.    Dispense:  375 mL    Refill:  3   nicotine (NICODERM CQ - DOSED IN MG/24 HOURS) 21 mg/24hr patch  Sig: Place 1 patch (21 mg total) onto the skin daily. Take  patch off at bedtime, place new patch on in AM.    Dispense:  28 patch    Refill:  0   Budeson-Glycopyrrol-Formoterol (BREZTRI AEROSPHERE) 160-9-4.8 MCG/ACT AERO    Sig: Inhale 2 puffs into the lungs in the morning and at bedtime.    Dispense:  5.9 g    Refill:  0    Order Specific Question:   Lot Number?    Answer:   [id number]    Order Specific Question:   Expiration Date?    Answer:   04/26/2024    Order Specific Question:   Manufacturer?    Answer:   [employer] [[id number]]    Order Specific Question:   NDC    Answer:   2800-3491-79 [[id number]]    Order Specific Question:   Quantity    Answer:   2   nicotine (NICODERM CQ - DOSED IN MG/24 HOURS) 14 mg/24hr patch    Sig: Place 1 patch (14 mg total) onto the skin daily. Take patch off at bedtime and place new patch in AM. Second step,.    Dispense:  28 patch    Refill:  0    This prescription is to start no sooner than March 27th   nicotine (NICODERM CQ - DOSED IN MG/24 HR) 7 mg/24hr patch    Sig: Place 1 patch (7 mg total) onto the skin daily. Third step    Dispense:  14 patch    Refill:  0    Do not fill prior to 11 November 2021   We will see the patient in follow-up in 4 to 6 weeks time.  He has been instructed to discontinue Symbicort as this has been replaced by Judithann Sauger 2 puffs twice a day.  Prescription for nicotine patches written.  We will call the patient with the results of overnight oximetry, PFTs and 2D echo.  Renold Don, MD Advanced Bronchoscopy [facility]    *This note was dictated using voice recognition software/Dragon.  Despite best efforts to proofread, errors can occur which can change the meaning. Any transcriptional errors that result from this process are unintentional and may not be fully corrected at the time of dictation.

## 2021-10-07 ENCOUNTER — Other Ambulatory Visit
Admission: RE | Admit: 2021-10-07 | Discharge: 2021-10-07 | Disposition: A | Discharge status: Home / Self Care | Payer: Medicare HMO | Source: Ambulatory Visit | Attending: Pulmonary Disease | Admitting: Pulmonary Disease

## 2021-10-07 ENCOUNTER — Other Ambulatory Visit: Payer: Self-pay

## 2021-10-07 ENCOUNTER — Telehealth: Payer: Self-pay

## 2021-10-07 DIAGNOSIS — Z20822 Contact with and (suspected) exposure to covid-19: Secondary | ICD-10-CM | POA: Diagnosis not present

## 2021-10-07 DIAGNOSIS — Z01812 Encounter for preprocedural laboratory examination: Secondary | ICD-10-CM | POA: Insufficient documentation

## 2021-10-07 LAB — SARS CORONAVIRUS 2 (TAT 6-24 HRS): SARS Coronavirus 2: NEGATIVE

## 2021-10-07 NOTE — Telephone Encounter (Signed)
Spoke to patient. He is aware of date/time of covid test prior to PFT. He stated that he is about to head to [facility] now.  ?

## 2021-10-08 ENCOUNTER — Ambulatory Visit: Payer: Medicare HMO | Attending: Pulmonary Disease

## 2021-10-08 DIAGNOSIS — E669 Obesity, unspecified: Secondary | ICD-10-CM | POA: Insufficient documentation

## 2021-10-08 DIAGNOSIS — F17210 Nicotine dependence, cigarettes, uncomplicated: Secondary | ICD-10-CM | POA: Insufficient documentation

## 2021-10-08 DIAGNOSIS — J449 Chronic obstructive pulmonary disease, unspecified: Secondary | ICD-10-CM | POA: Insufficient documentation

## 2021-10-08 DIAGNOSIS — I2510 Atherosclerotic heart disease of native coronary artery without angina pectoris: Secondary | ICD-10-CM | POA: Insufficient documentation

## 2021-10-08 DIAGNOSIS — R0609 Other forms of dyspnea: Secondary | ICD-10-CM

## 2021-10-08 DIAGNOSIS — I5030 Unspecified diastolic (congestive) heart failure: Secondary | ICD-10-CM | POA: Insufficient documentation

## 2021-10-08 DIAGNOSIS — C3492 Malignant neoplasm of unspecified part of left bronchus or lung: Secondary | ICD-10-CM

## 2021-10-08 MED ORDER — ALBUTEROL SULFATE (2.5 MG/3ML) 0.083% IN NEBU
2.5000 mg | INHALATION_SOLUTION | Freq: Once | RESPIRATORY_TRACT | Status: AC
  Administered 2021-10-08: 2.5 mg via RESPIRATORY_TRACT
  Filled 2021-10-08: qty 3

## 2021-10-08 NOTE — Telephone Encounter (Signed)
Created in error

## 2021-10-09 DIAGNOSIS — G4730 Sleep apnea, unspecified: Secondary | ICD-10-CM | POA: Diagnosis not present

## 2021-10-09 DIAGNOSIS — R0683 Snoring: Secondary | ICD-10-CM | POA: Diagnosis not present

## 2021-10-14 ENCOUNTER — Ambulatory Visit (INDEPENDENT_AMBULATORY_CARE_PROVIDER_SITE_OTHER): Payer: Medicare HMO

## 2021-10-14 ENCOUNTER — Other Ambulatory Visit: Payer: Self-pay

## 2021-10-14 DIAGNOSIS — F17210 Nicotine dependence, cigarettes, uncomplicated: Secondary | ICD-10-CM | POA: Diagnosis not present

## 2021-10-14 DIAGNOSIS — C3492 Malignant neoplasm of unspecified part of left bronchus or lung: Secondary | ICD-10-CM

## 2021-10-14 DIAGNOSIS — I77810 Thoracic aortic ectasia: Secondary | ICD-10-CM | POA: Diagnosis not present

## 2021-10-14 DIAGNOSIS — I5030 Unspecified diastolic (congestive) heart failure: Secondary | ICD-10-CM | POA: Diagnosis not present

## 2021-10-14 DIAGNOSIS — J449 Chronic obstructive pulmonary disease, unspecified: Secondary | ICD-10-CM

## 2021-10-14 DIAGNOSIS — I517 Cardiomegaly: Secondary | ICD-10-CM | POA: Diagnosis not present

## 2021-10-14 DIAGNOSIS — R0609 Other forms of dyspnea: Secondary | ICD-10-CM | POA: Diagnosis not present

## 2021-10-14 LAB — ECHOCARDIOGRAM COMPLETE
AR max vel: 4.39 cm2
AV Area VTI: 5.33 cm2
AV Area mean vel: 4.72 cm2
AV Mean grad: 3 mmHg
AV Peak grad: 6.5 mmHg
Ao pk vel: 1.27 m/s
Area-P 1/2: 2.11 cm2
Calc EF: 62.6 %
S' Lateral: 3.3 cm
Single Plane A2C EF: 62.4 %
Single Plane A4C EF: 65.2 %

## 2021-10-18 ENCOUNTER — Telehealth: Payer: Self-pay | Admitting: Pulmonary Disease

## 2021-10-18 NOTE — Telephone Encounter (Signed)
ONO reviewed by Dr. Dietrich Pates 2L QHS. Lowest spo2 82% ? ?Spoke to patient and relayed results/recommendations. He voiced his understanding.  ?He stated that he has a concentrator from his sister. He will set it at 2L and wear it when sleeping.  ?Nothing further needed.  ? ?

## 2021-10-25 ENCOUNTER — Encounter: Payer: Self-pay | Admitting: Internal Medicine

## 2021-10-25 ENCOUNTER — Ambulatory Visit (INDEPENDENT_AMBULATORY_CARE_PROVIDER_SITE_OTHER): Payer: Medicare HMO | Admitting: Internal Medicine

## 2021-10-25 ENCOUNTER — Ambulatory Visit (INDEPENDENT_AMBULATORY_CARE_PROVIDER_SITE_OTHER): Payer: Medicare HMO

## 2021-10-25 VITALS — BP 102/64 | HR 72 | Temp 98.1°F | Ht 72.0 in | Wt 255.6 lb

## 2021-10-25 DIAGNOSIS — J441 Chronic obstructive pulmonary disease with (acute) exacerbation: Secondary | ICD-10-CM | POA: Diagnosis not present

## 2021-10-25 DIAGNOSIS — F5104 Psychophysiologic insomnia: Secondary | ICD-10-CM | POA: Diagnosis not present

## 2021-10-25 DIAGNOSIS — J321 Chronic frontal sinusitis: Secondary | ICD-10-CM | POA: Diagnosis not present

## 2021-10-25 DIAGNOSIS — M19071 Primary osteoarthritis, right ankle and foot: Secondary | ICD-10-CM | POA: Diagnosis not present

## 2021-10-25 DIAGNOSIS — M25571 Pain in right ankle and joints of right foot: Secondary | ICD-10-CM

## 2021-10-25 DIAGNOSIS — M79671 Pain in right foot: Secondary | ICD-10-CM | POA: Diagnosis not present

## 2021-10-25 MED ORDER — PREDNISONE 20 MG PO TABS: 40.0000 mg | ORAL_TABLET | Freq: Every day | ORAL | 0 refills | Status: DC

## 2021-10-25 MED ORDER — HYDROCODONE-ACETAMINOPHEN 5-325 MG PO TABS: 1.0000 | ORAL_TABLET | Freq: Two times a day (BID) | ORAL | 0 refills | Status: DC | PRN

## 2021-10-25 NOTE — Progress Notes (Signed)
Chief Complaint  ?Patient presents with  ? Foot Pain  ?  Pain in the center of the right foot. No swelling or redness, no known injuries. Onset of 2 days ago. Pain radiating up into the ankle.   ? ?F/u  ?1. Right foot and ankle pain x 2-3 days 10/10 swelling no redness pain with walking nothing tried no injury  ?2. Chronic insomnia psychiatry is telemedicine out of [place] will contact him to address sleep  ? ? ?Review of Systems  ?Constitutional:  Negative for weight loss.  ?HENT:  Negative for hearing loss.   ?Eyes:  Negative for blurred vision.  ?Respiratory:  Negative for shortness of breath.   ?Cardiovascular:  Negative for chest pain.  ?Gastrointestinal:  Negative for abdominal pain and blood in stool.  ?Musculoskeletal:  Positive for joint pain. Negative for back pain.  ?Skin:  Negative for rash.  ?Neurological:  Negative for headaches.  ?Psychiatric/Behavioral:  Negative for depression.   ?Past Medical History:  ?Diagnosis Date  ? Alcohol abuse   ? pt states it is marijuana  ? Anemia   ? Arthritis   ? Bipolar disorder ([place])   ? BPH (benign prostatic hypertrophy) with urinary obstruction   ? CAD (coronary artery disease)   ? a. 2006 PCI/DES to LAD, EF 60%.  ? Chronic heart failure with preserved ejection fraction (HFpEF) ([place])   ? a. 07/2014 Echo: EF 55%; b. 11/2020 Echo: EF 50-55%, mod LVH, GrI DD, mildly enlarged RV w/ nl fxn, mild BAE.  ? Chronic pancreatitis ([place])   ? Cirrhosis ([place])   ? COPD (chronic obstructive pulmonary disease) ([place])   ? COVID-19   ? 08/22/20  ? Dementia ([place])   ? early dementia  ? Depression   ? Dilated ascending aorta and aortic root ([place])   ? a. 11/2020 Echo: Ao root 57mm, Asc Ao 73mm. Ao arch 19mm.  ? Diverticulitis   ? 12/06/20 [facility]   ? Diverticulosis   ? Drug use   ? Dyspnea   ? ED (erectile dysfunction)   ? Gastritis   ? GERD (gastroesophageal reflux disease)   ? Headache   ? occasional migraines  ? Hepatitis C   ? treated  ? History of lumbar fusion   ? Hyperlipidemia   ? Hypertension    ? patient denies having high blood pressure  ? Lung cancer ([place])   ? Pancreatitis   ? x 2   ? Pneumonia   ? 07/06/18   ? Premature atrial contractions   ? a. 04/2021 [facility]: Freq PACs w/ 8% burden.  ? Prostate cancer ([place])   ? with seeds  ? PSVT (paroxysmal supraventricular tachycardia) ([place])   ? a. 04/2021 [facility]: Avg HR 80 (60-210). 42 SVT runs - longest 5 beats @ 116, fastest 210 x 5 beats. Freq PACs (8%).  ? PTSD (post-traumatic stress disorder)   ? Rib fracture   ? s/p fall 03/15/19   ? Sinus disease   ? Urge incontinence   ? ?Past Surgical History:  ?Procedure Laterality Date  ? CARDIAC CATHETERIZATION  2006  ? cateract  2013  ? COLONOSCOPY WITH PROPOFOL N/A 10/04/2019  ? Procedure: COLONOSCOPY WITH PROPOFOL;  Surgeon: Lucilla Lame, MD;  Location: [facility] ENDOSCOPY;  Service: Endoscopy;  Laterality: N/A;  ? CORONARY ANGIOPLASTY  2006  ? CORONARY STENT PLACEMENT  2006  ? x 1  ? CYSTOSCOPY W/ RETROGRADES N/A 08/08/2021  ? Procedure: CYSTOSCOPY WITH RETROGRADE PYELOGRAM;  Surgeon: Hollice Espy, MD;  Location: [facility] ORS;  Service: Urology;  Laterality: N/A;  ? CYSTOSCOPY WITH URETHRAL DILATATION N/A 08/08/2021  ? Procedure: CYSTOSCOPY WITH URETHRAL DILATATION WITH UROLUME BALLOON;  Surgeon: Hollice Espy, MD;  Location: [facility] ORS;  Service: Urology;  Laterality: N/A;  ? ESOPHAGOGASTRODUODENOSCOPY N/A 12/22/2014  ? Procedure: ESOPHAGOGASTRODUODENOSCOPY (EGD);  Surgeon: [name], MD;  Location[facility] ENDOSCOPY;  Service: Endoscopy;  Laterality: N/A;  ? ESOPHAGOGASTRODUODENOSCOPY (EGD) WITH PROPOFOL N/A 10/04/2019  ? Procedure: ESOPHAGOGASTRODUODENOSCOPY (EGD) WITH PROPOFOL;  Surgeon: Lucilla Lame, MD;  Location: [facility] ENDOSCOPY;  Service: Endoscopy;  Laterality: N/A;  ? HERNIA REPAIR  2015  ? left groin  ? INTERCOSTAL NERVE BLOCK Left 11/09/2019  ? Procedure: Intercostal Nerve Block;  Surgeon: Melrose Nakayama, MD;  Location: [place];  Service: Thoracic;  Laterality: Left;  ? LUMBAR FUSION    ? RADIOACTIVE SEED  IMPLANT N/A 04/22/2016  ? Procedure: RADIOACTIVE SEED IMPLANT/BRACHYTHERAPY IMPLANT;  Surgeon: Hollice Espy, MD;  Location: [facility] ORS;  Service: Urology;  Laterality: N/A;  ? ROBOT ASSISTED INGUINAL HERNIA REPAIR Bilateral 07/06/2018  ? Procedure: ROBOT ASSISTED INGUINAL HERNIA REPAIR;  Surgeon: Jules Husbands, MD;  Location: [facility] ORS;  Service: General;  Laterality: Bilateral;  ? TONSILLECTOMY    ? UMBILICAL HERNIA REPAIR N/A 07/06/2018  ? Procedure: LAPAROSCOPIC ROBOT ASSISTED UMBILICAL HERNIA;  Surgeon: Jules Husbands, MD;  Location: [facility] ORS;  Service: General;  Laterality: N/A;  ? XI ROBOTIC ASSISTED THORACOSCOPY- SEGMENTECTOMY Left 11/09/2019  ? Procedure: XI ROBOTIC ASSISTED THORACOSCOPY-WEDGE RESECTION LEFT LOWER LOBE;  Surgeon: Melrose Nakayama, MD;  Location: [facility];  Service: Thoracic;  Laterality: Left;  ? ?Family History  ?Problem Relation Age of Onset  ? Heart disease Father   ?     Unclear details. Father passed in late 51's 2/2 cancer.  ? Colon cancer Father   ? Alcohol abuse Sister   ? Drug abuse Sister   ? Anxiety disorder Sister   ? Depression Sister   ? COPD Brother   ? Obesity Brother   ? Kidney disease Neg Hx   ? Prostate cancer Neg Hx   ? ?Social History  ? ?Socioeconomic History  ? Marital status: Divorced  ?  Spouse name: Not on file  ? Number of children: 4  ? Years of education: Not on file  ? Highest education level: GED or equivalent  ?Occupational History  ? Occupation: Aeronautical engineer  ?  Comment: retired  ?Tobacco Use  ? Smoking status: Former  ?  Packs/day: 1.00  ?  Years: 55.00  ?  Pack years: 55.00  ?  Types: Cigarettes  ?  Start date: 07/28/1962  ?  Quit date: 09/21/2021  ?  Years since quitting: 0.0  ? Smokeless tobacco: Never  ? Tobacco comments:  ?  Quit smoking 09/21/21 - 09/26/2021 *suspect he is still smoking*  ?Vaping Use  ? Vaping Use: Never used  ?Substance and Sexual Activity  ? Alcohol use: Yes  ?  Alcohol/week: 1.0 standard drink  ?  Types: 1 Cans of beer per week  ?   Comment: 2-3 beers a day  ? Drug use: Yes  ?  Frequency: 7.0 times per week  ?  Types: Marijuana  ?  Comment: Endorsed heroin 06/2014, UDS also + benzos, opiates, THC, neg for cocaine at that time.  ? Sexual activity: Not Currently  ?Other Topics Concern  ? Not on file  ?Social History Narrative  ? Lives at home   ? Has kids  and grandkids  ? Smoker x 55 years 1 ppd as of 07/2019 he did quit x 2 years   ? Drinks 1 pt liq qd as of 07/2019   ? ?Social Determinants of Health  ? ?Financial Resource Strain: Low Risk   ? Difficulty of Paying Living Expenses: Not hard at all  ?Food Insecurity: No Food Insecurity  ? Worried About Charity fundraiser in the Last Year: Never true  ? Ran Out of Food in the Last Year: Never true  ?Transportation Needs: No Transportation Needs  ? Lack of Transportation (Medical): No  ? Lack of Transportation (Non-Medical): No  ?Physical Activity: Not on file  ?Stress: No Stress Concern Present  ? Feeling of Stress : Not at all  ?Social Connections: Moderately Isolated  ? Frequency of Communication with Friends and Family: More than three times a week  ? Frequency of Social Gatherings with Friends and Family: Once a week  ? Attends Religious Services: Never  ? Active Member of Clubs or Organizations: Yes  ? Attends Archivist Meetings: More than 4 times per year  ? Marital Status: Divorced  ?Intimate Partner Violence: Not At Risk  ? Fear of Current or Ex-Partner: No  ? Emotionally Abused: No  ? Physically Abused: No  ? Sexually Abused: No  ? ?Current Meds  ?Medication Sig  ? albuterol (PROVENTIL) (2.5 MG/3ML) 0.083% nebulizer solution Take 3 mLs (2.5 mg total) by nebulization every 6 (six) hours as needed for wheezing or shortness of breath.  ? ARIPiprazole (ABILIFY) 10 MG tablet Take 10 mg by mouth daily.  ? aspirin EC 81 MG tablet Take 81 mg by mouth daily.  ? Budeson-Glycopyrrol-Formoterol (BREZTRI AEROSPHERE) 160-9-4.8 MCG/ACT AERO Inhale 2 puffs into the lungs in the morning and at  bedtime.  ? Budeson-Glycopyrrol-Formoterol (BREZTRI AEROSPHERE) 160-9-4.8 MCG/ACT AERO Inhale 2 puffs into the lungs in the morning and at bedtime.  ? celecoxib (CELEBREX) 200 MG capsule Take 1 capsule (20

## 2021-10-25 NOTE — Patient Instructions (Addendum)
Christopher Orr [phone]  ?This is the name of your psychiatrist  ?[facility]  ?Call about sleep issues and I will send a message too  ? ?Appt [facility] Monday 10/28/21 at 3:15 PM Dr. Sherryle Lis  ? ?Foot Pain ?Many things can cause foot pain. Some common causes are: ?An injury. ?A sprain. ?Arthritis. ?Blisters. ?Bunions. ?Follow these instructions at home: ?Managing pain, stiffness, and swelling ?If directed, put ice on the painful area: ?Put ice in a plastic bag. ?Place a towel between your skin and the bag. ?Leave the ice on for 20 minutes, 2-3 times a day. ? ?Activity ?Do not stand or walk for long periods. ?Return to your normal activities as told by your health care provider. Ask your health care provider what activities are safe for you. ?Do stretches to relieve foot pain and stiffness as told by your health care provider. ?Do not lift anything that is heavier than 10 lb (4.5 kg), or the limit that you are told, until your health care provider says that it is safe. Lifting a lot of weight can put added pressure on your feet. ?Lifestyle ?Wear comfortable, supportive shoes that fit you well. Do not wear high heels. ?Keep your feet clean and dry. ?General instructions ?Take over-the-counter and prescription medicines only as told by your health care provider. ?Rub your foot gently. ?Pay attention to any changes in your symptoms. ?Keep all follow-up visits as told by your health care provider. This is important. ?Contact a health care provider if: ?Your pain does not get better after a few days of self-care. ?Your pain gets worse. ?You cannot stand on your foot. ?Get help right away if: ?Your foot is numb or tingling. ?Your foot or toes are swollen. ?Your foot or toes turn white or blue. ?You have warmth and redness along your foot. ?Summary ?Common causes of foot pain are injury, sprain, arthritis, blisters, or bunions. ?Ice, medicines, and comfortable shoes may help foot pain. ?Contact  your health care provider if your pain does not get better after a few days of self-care. ?This information is not intended to replace advice given to you by your health care provider. Make sure you discuss any questions you have with your health care provider. ?Document Revised: 10/17/2020 Document Reviewed: 10/17/2020 ?Elsevier Patient Education ? [place]. ? ?Ankle Pain ?The ankle joint holds your body weight and allows you to move around. Ankle pain can occur on either side or the back of one ankle or both ankles. Ankle pain may be sharp and burning or dull and aching. There may be tenderness, stiffness, redness, or warmth around the ankle. Many things can cause ankle pain, including an injury to the area and overuse of the ankle. ?Follow these instructions at home: ?Activity ?Rest your ankle as told by your health care provider. Avoid any activities that cause ankle pain. ?Do not use the injured limb to support your body weight until your health care provider says that you can. Use crutches as told by your health care provider. ?Do exercises as told by your health care provider. ?Ask your health care provider when it is safe to drive if you have a brace on your ankle. ?If you have a brace: ?Wear the brace as told by your health care provider. Remove it only as told by your health care provider. ?Loosen the brace if your toes tingle, become numb, or turn cold and blue. ?Keep the brace clean. ?If the brace  is not waterproof: ?Do not let it get wet. ?Cover it with a watertight covering when you take a bath or shower. ?If you were given an elastic bandage: ? ?Remove it when you take a bath or a shower. ?Try not to move your ankle very much, but wiggle your toes from time to time. This helps to prevent swelling. ?Adjust the bandage to make it more comfortable if it feels too tight. ?Loosen the bandage if you have numbness or tingling in your foot or if your foot turns cold and blue. ?Managing pain,  stiffness, and swelling ? ?If directed, put ice on the painful area. ?If you have a removable brace or elastic bandage, remove it as told by your health care provider. ?Put ice in a plastic bag. ?Place a towel between your skin and the bag. ?Leave the ice on for 20 minutes, 2-3 times a day. ?Move your toes often to avoid stiffness and to lessen swelling. ?Raise (elevate) your ankle above the level of your heart while you are sitting or lying down. ?General instructions ?Record information about your pain. Writing down the following may be helpful for you and your health care provider: ?How often you have ankle pain. ?Where the pain is located. ?What the pain feels like. ?If treatment involves wearing a prescribed shoe or insole, make sure you wear it correctly and for as long as told by your health care provider. ?Take over-the-counter and prescription medicines only as told by your health care provider. ?Keep all follow-up visits as told by your health care provider. This is important. ?Contact a health care provider if: ?Your pain gets worse. ?Your pain is not relieved with medicines. ?You have a fever or chills. ?You are having more trouble with walking. ?You have new symptoms. ?Get help right away if: ?Your foot, leg, toes, or ankle: ?Tingles or becomes numb. ?Becomes swollen. ?Turns pale or blue. ?Summary ?Ankle pain can occur on either side or the back of one ankle or both ankles. ?Ankle pain may be sharp and burning or dull and aching. ?Rest your ankle as told by your health care provider. If told, apply ice to the area. ?Take over-the-counter and prescription medicines only as told by your health care provider. ?This information is not intended to replace advice given to you by your health care provider. Make sure you discuss any questions you have with your health care provider. ?Document Revised: 09/06/2020 Document Reviewed: 09/06/2020 ?Elsevier Patient Education ? [place]. ? ?

## 2021-10-28 ENCOUNTER — Ambulatory Visit: Payer: Medicare HMO | Admitting: Podiatry

## 2021-10-28 DIAGNOSIS — M10071 Idiopathic gout, right ankle and foot: Secondary | ICD-10-CM

## 2021-10-28 MED ORDER — HYDROMORPHONE HCL 2 MG PO TABS: 2.0000 mg | ORAL_TABLET | Freq: Three times a day (TID) | ORAL | 0 refills | Status: AC | PRN

## 2021-10-29 DIAGNOSIS — M10071 Idiopathic gout, right ankle and foot: Secondary | ICD-10-CM | POA: Diagnosis not present

## 2021-10-30 LAB — CBC WITH DIFFERENTIAL/PLATELET
Basophils Absolute: 0 10*3/uL (ref 0.0–0.2)
Basos: 0 %
EOS (ABSOLUTE): 0 10*3/uL (ref 0.0–0.4)
Eos: 0 %
Hematocrit: 36.3 % — ABNORMAL LOW (ref 37.5–51.0)
Hemoglobin: 12.2 g/dL — ABNORMAL LOW (ref 13.0–17.7)
Immature Grans (Abs): 0.1 10*3/uL (ref 0.0–0.1)
Immature Granulocytes: 1 %
Lymphocytes Absolute: 1.7 10*3/uL (ref 0.7–3.1)
Lymphs: 14 %
MCH: 32.7 pg (ref 26.6–33.0)
MCHC: 33.6 g/dL (ref 31.5–35.7)
MCV: 97 fL (ref 79–97)
Monocytes Absolute: 1.1 10*3/uL — ABNORMAL HIGH (ref 0.1–0.9)
Monocytes: 9 %
Neutrophils Absolute: 9.2 10*3/uL — ABNORMAL HIGH (ref 1.4–7.0)
Neutrophils: 76 %
Platelets: 274 10*3/uL (ref 150–450)
RBC: 3.73 x10E6/uL — ABNORMAL LOW (ref 4.14–5.80)
RDW: 13.7 % (ref 11.6–15.4)
WBC: 12.1 10*3/uL — ABNORMAL HIGH (ref 3.4–10.8)

## 2021-10-30 LAB — BASIC METABOLIC PANEL
BUN/Creatinine Ratio: 17 (ref 10–24)
BUN: 18 mg/dL (ref 8–27)
CO2: 23 mmol/L (ref 20–29)
Calcium: 9.8 mg/dL (ref 8.6–10.2)
Chloride: 104 mmol/L (ref 96–106)
Creatinine, Ser: 1.03 mg/dL (ref 0.76–1.27)
Glucose: 84 mg/dL (ref 70–99)
Potassium: 4.4 mmol/L (ref 3.5–5.2)
Sodium: 142 mmol/L (ref 134–144)
eGFR: 76 mL/min/{1.73_m2} (ref >59)

## 2021-10-30 LAB — URIC ACID: Uric Acid: 5.1 mg/dL (ref 3.8–8.4)

## 2021-10-31 ENCOUNTER — Encounter: Payer: Self-pay | Admitting: Pulmonary Disease

## 2021-10-31 ENCOUNTER — Ambulatory Visit: Payer: Medicare HMO | Admitting: Pulmonary Disease

## 2021-10-31 VITALS — BP 110/60 | HR 71 | Temp 98.0°F | Ht 72.0 in | Wt 265.2 lb

## 2021-10-31 DIAGNOSIS — J449 Chronic obstructive pulmonary disease, unspecified: Secondary | ICD-10-CM | POA: Diagnosis not present

## 2021-10-31 DIAGNOSIS — R0609 Other forms of dyspnea: Secondary | ICD-10-CM

## 2021-10-31 DIAGNOSIS — C3492 Malignant neoplasm of unspecified part of left bronchus or lung: Secondary | ICD-10-CM

## 2021-10-31 DIAGNOSIS — F17210 Nicotine dependence, cigarettes, uncomplicated: Secondary | ICD-10-CM | POA: Diagnosis not present

## 2021-10-31 MED ORDER — BREZTRI AEROSPHERE 160-9-4.8 MCG/ACT IN AERO: 2.0000 | INHALATION_SPRAY | Freq: Two times a day (BID) | RESPIRATORY_TRACT | 0 refills | Status: DC

## 2021-10-31 NOTE — Patient Instructions (Signed)
We have given you a sample of the [place]. ? ?We sent a prescription for your Christopher Orr to your pharmacy. ? ?Please reconsider and stop smoking. ? ?You need to stop using any caffeine after 3 PM in the afternoon.  This will cause issues with your sleeping if you continue to take caffeine throughout the day. ? ?You may use melatonin 5 mg 1 hour before bedtime every day to help you with your sleep.  You can find melatonin over-the-counter. ? ?We will see you in follow-up in 3 months time call sooner should any new problems arise. ?

## 2021-10-31 NOTE — Progress Notes (Signed)
Subjective:    Patient ID: Christopher Orr, male    DOB: 11-24-1945, 76 y.o.   MRN: [id number] Patient Care Team: McLean-Scocuzza, Pasty Spillers, MD as PCP - General (Internal Medicine) Iran Ouch, MD as PCP - Cardiology (Cardiology) Lanier Prude, MD as PCP - Electrophysiology (Cardiology) Glory Buff, RN as Oncology Nurse Navigator  Chief Complaint  Patient presents with   Follow-up    SOB with exertion and occ wheezing.     HPI This is a 76 year old current smoker (1 PPD) who presents for follow-up on the issue of stage II COPD and dyspnea.  He has a history of noncompliance and poor insight as to his medical issues.  He was last seen on 26 September 2021.  At that time he had ambulatory oximetry that failed to show any significant oxygen desaturations.  Pulmonary function testing and echocardiograms were ordered.  Also had pulse oximetry overnight.  His prior visit he also had a trial of Breztri.  He states that [name] is helping with his breathing.  We also counseled him with regards to discontinuation of smoking and wrote for nicotine patches.  The patient states that he is having difficulties with insomnia but does admit that he drinks coffee throughout the day and sometimes up till 2:00 in the morning.  We discussed he needs to stop all caffeinated beverages between 2:58 in the afternoon and nothing further after that.  This may be affecting his sleep hygiene.  He continues to smoke a pack of cigarettes per day and is not motivated to quit.  Echocardiogram showed that he had LVEF of 55 to 60% grade 1 DD and right ventricular size that was moderately enlarged in keeping with Cor pulmonale.  There is also dilatation of the aortic root.  PFTs were performed on 14 March and these showed FEV1 of 2.11 L or 63% predicted, FVC of 3.66 L or 80% predicted, FEV1/FVC of 58%, there is significant bronchodilator response, there is moderate/severe diffusion capacity impairment in keeping with  emphysema and airway obstruction with reversible component.  Lung volumes were normal.  He has not had overnight oximetry performed yet.  Today he notes that his shortness of breath on exertion and wheezing are better on the [facility].  He does not endorse any other symptomatology.  He seems forgetful at times.   DATA 08 September 2019 preoperative PFTs: FEV1 2.43 L or 71% predicted.  FVC 3.88 L or 83% predicted.  FEV1/FVC 63%.  No bronchodilator response.  Lung volumes normal.  Diffusion capacity 53%.  Insistent with moderate obstructive defect, diffusion capacity decreased suggestive of emphysema 27 December 2019 ambulatory oximetry: No desaturation with exercise oxygen saturations remained 95 to 96% 17 May 2020 CT chest: Status post wedge resection left lower lobe pulmonary nodule.  Irregular parenchymal density surrounding the clips, no evidence of metastatic disease elsewhere, small loculated pleural effusion, attention on follow-up recommended 05 November 2020 FTs: FEV1 2.0 L or 59% predicted, FVC 3.58 L or 77% predicted, FEV1/FVC 56%, there was significant bronchodilator response with 14% net change postbronchodilator on FEV1.  Mild air trapping.  No hyperinflation.  Diffusion capacity moderately reduced.  Consistent with moderate COPD on the basis of emphysema with airways reversibility 12 Dec 2020 echocardiogram: LVEF 50 to 55%, LV with low normal function.  Grade I DD.  Mildly enlarged RV.  Mildly dilated LV and RV.  Mild  dilatation of the aortic root 13 June 2021 CT chest without contrast: Moderate centrilobular and paraseptal  emphysema, diffuse bronchial thickening status post left lower lobe wedge resection with nodular scar tissue, subpleural round atelectasis about lateral left lower lobe and lingula, no evidence of recurrence.  Incidental finding of coronary artery disease (three-vessel coronary artery calcifications and stents) 08 October 2021 PFTs:FEV1 of 2.11 L or 63% predicted, FVC of  3.66 L or 80% predicted, FEV1/FVC of 58%, there is significant bronchodilator response, there is moderate/severe diffusion capacity impairment in keeping with emphysema and airway obstruction with reversible component.  Lung volumes were normal.   Review of Systems A 10 point review of systems was performed and it is as noted above otherwise negative.  Patient Active Problem List   Diagnosis Date Noted   Right ear pain 07/18/2021   Viral upper respiratory tract infection 07/18/2021   Alcohol-induced mood disorder (HCC) 06/30/2021   Alcohol intoxication (HCC) 06/30/2021   Bipolar disorder (HCC) 01/29/2021   Dizziness 01/29/2021   Syncope 01/29/2021   Sinus bradycardia 01/29/2021   Tetrahydrocannabinol (THC) use disorder, mild, abuse 01/29/2021   Other chronic pain 01/29/2021   Internal hemorrhoids 01/29/2021   Syncope due to orthostatic hypotension 01/23/2021   Chronic bilateral low back pain without sciatica 12/27/2020   S/P lumbar fusion 12/27/2020   Alcohol abuse 11/01/2020   Malignant neoplasm of lower lobe of left lung (HCC) 05/15/2020   SOB (shortness of breath) on exertion 05/15/2020   Late onset Alzheimer's disease without behavioral disturbance (HCC) 05/07/2020   Squamous carcinoma of lung, left (HCC) 11/26/2019   Goals of care, counseling/discussion 11/26/2019   Nodule of lower lobe of left lung 11/09/2019   Lung cancer (HCC) 11/01/2019   Abnormal findings on diagnostic imaging of digestive system    Polyp of transverse colon    Prediabetes 09/14/2019   Kidney lesion 08/15/2019   History of prostate cancer 08/15/2019   Arthritis 08/15/2019   Hernia, inguinal, bilateral 08/15/2019   History of pancreatitis 08/15/2019   Cirrhosis of liver (HCC) 08/15/2019   Insomnia 08/15/2019   Memory loss 08/15/2019   Nodule of middle lobe of right lung 08/15/2019   Tobacco abuse 08/15/2019   CAD (coronary artery disease)    Degenerative disc disease, lumbar 11/01/2018    Cigarette smoker 08/05/2018   Acute respiratory failure with hypoxia (HCC) 07/06/2018   Bilateral inguinal hernia, without obstruction or gangrene, recurrent    Umbilical hernia without obstruction and without gangrene    Panic attacks 06/14/2018   Benzodiazepine dependence (HCC) 06/14/2018   Post traumatic stress disorder (PTSD) 06/14/2018   Depression, recurrent (HCC) 06/14/2018   Coronary artery disease involving native heart with angina pectoris (HCC) 05/31/2018   Osteoarthritis of multiple joints 05/31/2018   Hyperlipidemia 05/31/2018   Chronic viral hepatitis C (HCC) 09/24/2014   Chest pain 07/28/2014   COPD (chronic obstructive pulmonary disease) (HCC) 07/28/2014   Anxiety and depression 07/28/2014   Aspiration pneumonia (HCC) 07/28/2014   Elevated troponin I level 07/28/2014   Acute kidney injury (HCC) 07/28/2014   GERD (gastroesophageal reflux disease) 07/28/2014   Social History   Tobacco Use   Smoking status: Former    Packs/day: 1.00    Years: 55.00    Pack years: 55.00    Types: Cigarettes    Start date: 07/28/1962   Smokeless tobacco: Never  Substance Use Topics   Alcohol use: Yes    Alcohol/week: 1.0 standard drink    Types: 1 Cans of beer per week    Comment: 2-3 beers a day   .    Objective:  Physical Exam BP 110/60 (BP Location: Left Arm, Cuff Size: Large)   Pulse 71   Temp 98 F (36.7 C) (Temporal)   Ht 6' (1.829 m)   Wt 265 lb 3.2 oz (120.3 kg)   SpO2 94%   BMI 35.97 kg/m  GENERAL: Obese gentleman, no acute respiratory distress.  Fully ambulatory.  Smells heavily of tobacco.  Rudy complexion, no respiratory distress speech is fluent. HEAD: Normocephalic, atraumatic.  EYES: Pupils equal, round, reactive to light.  No scleral icterus.  MOUTH: Poor dentition, oral mucosa moist. NECK: Supple.  Thick neck.  No thyromegaly. Trachea midline. No JVD.  No adenopathy. PULMONARY: Good air entry bilaterally.  Scattered rhonchi and wheezes throughout.   CARDIOVASCULAR: S1 and S2. Regular rate and rhythm.  No rubs murmurs or gallops heard. GASTROINTESTINAL: Obese, otherwise, benign. MUSCULOSKELETAL: No joint deformity, no clubbing, no edema.  NEUROLOGIC: No overt focal deficits. Speech is fluent.  No gait disturbance noticed on ambulation. SKIN: Intact,warm,dry. Nicotine stained fingers on right hand. PSYCH: Normal mood and affect.  Seems befuddled at times.     Assessment & Plan:     ICD-10-CM   1. Stage 2 moderate COPD by GOLD classification (HCC)  J44.9    Continue Breztri 2 puffs twice a day Continue as needed albuterol STOP SMOKING!    2. Dyspnea on exertion  R06.09    Improved on Breztri Overnight oximetry pending Has evidence of Cor pulmonale (chronic)    3. Squamous carcinoma of lung, left (HCC)  C34.92    Continue follow-up with oncology Has close follow-up with CT of the chest    4. Tobacco dependence due to cigarettes  F17.210    Patient counseled regards discontinuation of smoking Total counseling time 3 to 5 minutes     Meds ordered this encounter  Medications   Budeson-Glycopyrrol-Formoterol (BREZTRI AEROSPHERE) 160-9-4.8 MCG/ACT AERO    Sig: Inhale 2 puffs into the lungs in the morning and at bedtime.    Dispense:  5.9 g    Refill:  0    Order Specific Question:   Lot Number?    Answer:   [id number]    Order Specific Question:   Expiration Date?    Answer:   03/28/2024    Order Specific Question:   Manufacturer?    Answer:   [employer] [[id number]]    Order Specific Question:   Quantity    Answer:   1   We discussed appropriate sleep hygiene with the patient.  Discussed stopping any caffeine use after 3 PM in the afternoon.  Discussed also use of LOW dose melatonin an hour before bedtime.  Will see the patient in follow-up in 3 months time call sooner should any new problems arise.  Gailen Shelter, MD Advanced Bronchoscopy PCCM [facility]    *This note was dictated using voice  recognition software/Dragon.  Despite best efforts to proofread, errors can occur which can change the meaning. Any transcriptional errors that result from this process are unintentional and may not be fully corrected at the time of dictation.

## 2021-11-02 NOTE — Progress Notes (Signed)
?  Subjective:  ?Patient ID: Christopher Orr, male    DOB: 03-10-1946,  MRN: [id number] ? ?Chief Complaint  ?Patient presents with  ? Foot Swelling  ?  foot right ankle swelling and pain-Dr Karlyn Agee called to schedule his appt 3.31.2023. She is doing xrays in her office.  ? ? ?76 y.o. male presents with the above complaint. History confirmed with patient.  Does not recall any trauma or injury or falling on it.  He was prescribed Vicodin but it is not helping.  Has a lot of swelling in the right foot and ankle ? ?Objective:  ?Physical Exam: ?warm, good capillary refill, no trophic changes or ulcerative lesions, normal DP and PT pulses, normal sensory exam, and significant pain and swelling in the right ankle and subtalar joint lateral. ? ?Assessment:  ? ?1. Acute idiopathic gout of right ankle   ? ? ? ?Plan:  ?Patient was evaluated and treated and all questions answered. ? ?Discussed with him I think this likely is gout I have ordered lab work for him.  Is been very painful.  Vicodin has not been helpful.  I did prescribe him Dilaudid and cautioned him on use possible side effects and risk of opioid addiction.  I reviewed the PDMP prior to prescribing.  Discussed if not improving by next visit consider an injection in the ankle and subtalar joint.  I will see him back in 1 month. ? ?Return in about 1 month (around 11/27/2021) for ankle pain and swelling .  ? ?

## 2021-11-11 ENCOUNTER — Telehealth: Payer: Self-pay | Admitting: Podiatry

## 2021-11-11 NOTE — Telephone Encounter (Signed)
Notified pt and he is scheduled to see Dr Sherryle Lis on 5.3.2023. ?

## 2021-11-11 NOTE — Telephone Encounter (Signed)
Pt called and stated he has terrible pain and swelling from gout in his ankles and he is asking if the doctor could call in a pain medication for him. He uses [facility] on [address] in [place]. ?

## 2021-11-11 NOTE — Telephone Encounter (Signed)
I cannot prescribe any more of the pain medication. He was told to make an appointment if any worsening for an injection into the ankle.

## 2021-11-18 ENCOUNTER — Other Ambulatory Visit: Payer: Self-pay | Admitting: Internal Medicine

## 2021-11-18 ENCOUNTER — Other Ambulatory Visit: Payer: Self-pay | Admitting: Medical

## 2021-11-18 DIAGNOSIS — K219 Gastro-esophageal reflux disease without esophagitis: Secondary | ICD-10-CM

## 2021-11-18 DIAGNOSIS — N400 Enlarged prostate without lower urinary tract symptoms: Secondary | ICD-10-CM

## 2021-11-18 DIAGNOSIS — R4189 Other symptoms and signs involving cognitive functions and awareness: Secondary | ICD-10-CM

## 2021-11-27 ENCOUNTER — Ambulatory Visit: Payer: Medicare HMO | Admitting: Podiatry

## 2021-11-27 DIAGNOSIS — M10071 Idiopathic gout, right ankle and foot: Secondary | ICD-10-CM

## 2021-11-27 MED ORDER — METHYLPREDNISOLONE 4 MG PO TBPK: ORAL_TABLET | ORAL | 0 refills | Status: DC

## 2021-11-28 ENCOUNTER — Telehealth: Payer: Self-pay

## 2021-11-28 NOTE — Telephone Encounter (Signed)
Faxed medical records to [facility] at [phone]. ?

## 2021-12-02 ENCOUNTER — Encounter: Payer: Self-pay | Admitting: Podiatry

## 2021-12-02 NOTE — Progress Notes (Signed)
?  Subjective:  ?Patient ID: Christopher Orr, male    DOB: 08/19/1945,  MRN: [id number] ? ?Chief Complaint  ?Patient presents with  ? Gout  ?  Right ankle pain and swelling  ? ? ?76 y.o. male presents with the above complaint. History confirmed with patient.  Doing better than it was the last visit seems to be improving but will go away ? ?Objective:  ?Physical Exam: ?warm, good capillary refill, no trophic changes or ulcerative lesions, normal DP and PT pulses, normal sensory exam, and pain and edema over the lateral ankle and subtalar joint has improved some ? ?Assessment:  ? ?1. Acute idiopathic gout of right ankle   ? ? ? ?Plan:  ?Patient was evaluated and treated and all questions answered. ? ?Assessment continued pain.  I recommended a repeat of the prednisone taper and this was prescribed for him.  We reviewed its use and possible side effects and advised to watch his blood glucose.  Corticosteroid injection was performed of the lateral subtalar joint through a sinus tarsi approach with 10 mg of Kenalog and 2 mg of dexamethasone phosphate with 1 cc of lidocaine.  He tolerated this well and hopefully should eliminate the rest of his pain. ? ?No follow-ups on file.  ? ?

## 2021-12-13 ENCOUNTER — Other Ambulatory Visit: Payer: Self-pay | Admitting: Internal Medicine

## 2021-12-13 ENCOUNTER — Telehealth: Payer: Self-pay | Admitting: Internal Medicine

## 2021-12-13 MED ORDER — TRAZODONE HCL 100 MG PO TABS: 100.0000 mg | ORAL_TABLET | Freq: Every evening | ORAL | 3 refills | Status: DC | PRN

## 2021-12-13 NOTE — Telephone Encounter (Signed)
Pt called wanting his Trazadone changed from 100mg  to 150mg  sent to [place].pt does not know who changed it

## 2021-12-13 NOTE — Telephone Encounter (Signed)
Trazadone interferes with fluoxetine so If pt having trouble sleeping discuss with psychiatry if we increase it there is a risk of drug drug interactions serotonin syndrome

## 2021-12-18 ENCOUNTER — Other Ambulatory Visit: Payer: Self-pay | Admitting: Medical

## 2021-12-18 ENCOUNTER — Other Ambulatory Visit: Payer: Self-pay | Admitting: Internal Medicine

## 2021-12-18 DIAGNOSIS — M159 Polyosteoarthritis, unspecified: Secondary | ICD-10-CM

## 2021-12-18 DIAGNOSIS — K219 Gastro-esophageal reflux disease without esophagitis: Secondary | ICD-10-CM

## 2021-12-24 ENCOUNTER — Ambulatory Visit
Admission: RE | Admit: 2021-12-24 | Discharge: 2021-12-24 | Disposition: A | Discharge status: Home / Self Care | Payer: Medicare HMO | Source: Ambulatory Visit | Attending: Oncology | Admitting: Oncology

## 2021-12-24 ENCOUNTER — Inpatient Hospital Stay: Payer: Medicare HMO | Attending: Internal Medicine

## 2021-12-24 DIAGNOSIS — J9809 Other diseases of bronchus, not elsewhere classified: Secondary | ICD-10-CM | POA: Diagnosis not present

## 2021-12-24 DIAGNOSIS — R911 Solitary pulmonary nodule: Secondary | ICD-10-CM | POA: Diagnosis not present

## 2021-12-24 DIAGNOSIS — Z85118 Personal history of other malignant neoplasm of bronchus and lung: Secondary | ICD-10-CM | POA: Insufficient documentation

## 2021-12-24 DIAGNOSIS — Z08 Encounter for follow-up examination after completed treatment for malignant neoplasm: Secondary | ICD-10-CM | POA: Insufficient documentation

## 2021-12-24 DIAGNOSIS — J432 Centrilobular emphysema: Secondary | ICD-10-CM | POA: Diagnosis not present

## 2021-12-24 LAB — COMPREHENSIVE METABOLIC PANEL
ALT: 17 U/L (ref 0–44)
AST: 19 U/L (ref 15–41)
Albumin: 3.8 g/dL (ref 3.5–5.0)
Alkaline Phosphatase: 83 U/L (ref 38–126)
Anion gap: 6 (ref 5–15)
BUN: 17 mg/dL (ref 8–23)
CO2: 26 mmol/L (ref 22–32)
Calcium: 8.8 mg/dL — ABNORMAL LOW (ref 8.9–10.3)
Chloride: 107 mmol/L (ref 98–111)
Creatinine, Ser: 1.32 mg/dL — ABNORMAL HIGH (ref 0.61–1.24)
GFR, Estimated: 56 mL/min — ABNORMAL LOW (ref >60)
Glucose, Bld: 99 mg/dL (ref 70–99)
Potassium: 4.1 mmol/L (ref 3.5–5.1)
Sodium: 139 mmol/L (ref 135–145)
Total Bilirubin: 0.2 mg/dL — ABNORMAL LOW (ref 0.3–1.2)
Total Protein: 7.2 g/dL (ref 6.5–8.1)

## 2021-12-24 LAB — CBC WITH DIFFERENTIAL/PLATELET
Abs Immature Granulocytes: 0.03 10*3/uL (ref 0.00–0.07)
Basophils Absolute: 0.1 10*3/uL (ref 0.0–0.1)
Basophils Relative: 1 %
Eosinophils Absolute: 0.2 10*3/uL (ref 0.0–0.5)
Eosinophils Relative: 2 %
HCT: 36.6 % — ABNORMAL LOW (ref 39.0–52.0)
Hemoglobin: 12.5 g/dL — ABNORMAL LOW (ref 13.0–17.0)
Immature Granulocytes: 0 %
Lymphocytes Relative: 21 %
Lymphs Abs: 1.9 10*3/uL (ref 0.7–4.0)
MCH: 33.2 pg (ref 26.0–34.0)
MCHC: 34.2 g/dL (ref 30.0–36.0)
MCV: 97.3 fL (ref 80.0–100.0)
Monocytes Absolute: 0.8 10*3/uL (ref 0.1–1.0)
Monocytes Relative: 9 %
Neutro Abs: 5.8 10*3/uL (ref 1.7–7.7)
Neutrophils Relative %: 67 %
Platelets: 290 10*3/uL (ref 150–400)
RBC: 3.76 MIL/uL — ABNORMAL LOW (ref 4.22–5.81)
RDW: 14 % (ref 11.5–15.5)
WBC: 8.8 10*3/uL (ref 4.0–10.5)
nRBC: 0 % (ref 0.0–0.2)

## 2021-12-27 ENCOUNTER — Inpatient Hospital Stay: Payer: Medicare HMO | Attending: Oncology | Admitting: Medical Oncology

## 2021-12-27 DIAGNOSIS — Z85118 Personal history of other malignant neoplasm of bronchus and lung: Secondary | ICD-10-CM | POA: Insufficient documentation

## 2021-12-27 DIAGNOSIS — Z08 Encounter for follow-up examination after completed treatment for malignant neoplasm: Secondary | ICD-10-CM | POA: Insufficient documentation

## 2021-12-31 ENCOUNTER — Inpatient Hospital Stay: Payer: Medicare HMO | Admitting: Medical Oncology

## 2021-12-31 ENCOUNTER — Encounter: Payer: Self-pay | Admitting: Medical Oncology

## 2021-12-31 VITALS — BP 111/78 | HR 72 | Temp 97.1°F | Resp 18 | Wt 265.0 lb

## 2021-12-31 DIAGNOSIS — Z08 Encounter for follow-up examination after completed treatment for malignant neoplasm: Secondary | ICD-10-CM | POA: Diagnosis not present

## 2021-12-31 DIAGNOSIS — C3492 Malignant neoplasm of unspecified part of left bronchus or lung: Secondary | ICD-10-CM

## 2021-12-31 DIAGNOSIS — D539 Nutritional anemia, unspecified: Secondary | ICD-10-CM | POA: Diagnosis not present

## 2021-12-31 DIAGNOSIS — Z85118 Personal history of other malignant neoplasm of bronchus and lung: Secondary | ICD-10-CM | POA: Diagnosis not present

## 2021-12-31 NOTE — Progress Notes (Signed)
Hematology/Oncology Consult note [facility]  [phone] [phone]  Patient Care Team: McLean-Scocuzza, [name], MD as PCP - General (Internal Medicine) Wellington Hampshire, MD as PCP - Cardiology (Cardiology) Vickie Epley, MD as PCP - Electrophysiology (Cardiology) Telford Nab, RN as Oncology Nurse Navigator   Name of the patient: Christopher Orr  [id number]  [birth year]   Date of visit: 12/31/21  Diagnosis- Stage IA lung cancer s/p surgery  Chief complaint/ Reason for visit-routine follow-up of lung cancer  Heme/Onc history:  Patient is a 76 year old male with past medical history significant for hypertension, hepatitis C, cirrhosis, COPD among other medical problems.  He is not on any baseline oxygen.  Patient underwent CT chest without contrast which showed a 1 cm left lower lobe pulmonary nodule concerning for bronchogenic carcinoma which was new as compared to his prior scan in 2006.  No thoracic adenopathy.    PET CT scan in February 2021 showed hypermetabolic left lower lobe lesion measuring 1.2 x 1.2 cm.  No evidence of intrathoracic adenopathy.  Also noted to have hypermetabolic lesion in the left parotid gland 1.7 x 2 cm.  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  Patient underwent wedge resection of the left lower lobe lung nodule in April 2021 which showed a 1.6 cm squamous cell carcinoma of.  No involvement of visceral.  Margins negative. 5 lymph nodes negative for malignancy.  Patient did not require any adjuvant chemotherapy and is currently in remission under surveillance  Interval history-patient reports that he is doing well.  His shortness of breath is chronic and stable.  He continues to smoke the same amount.  He denies any weight loss, night sweats, hemoptysis.   ECOG PS- 1 Pain scale- 0  Review of systems- Review of Systems  Constitutional:  Positive for malaise/fatigue.  Negative for chills, fever and weight loss.  HENT:  Negative for congestion, ear discharge and nosebleeds.   Eyes:  Negative for blurred vision.  Respiratory:  Negative for cough, hemoptysis, sputum production, shortness of breath and wheezing.   Cardiovascular:  Negative for chest pain, palpitations, orthopnea and claudication.  Gastrointestinal:  Negative for abdominal pain, blood in stool, constipation, diarrhea, heartburn, melena, nausea and vomiting.  Genitourinary:  Negative for dysuria, flank pain, frequency, hematuria and urgency.       Urinary incontinence  Musculoskeletal:  Negative for back pain, joint pain and myalgias.  Skin:  Negative for rash.  Neurological:  Negative for dizziness, tingling, focal weakness, seizures, weakness and headaches.  Endo/Heme/Allergies:  Does not bruise/bleed easily.  Psychiatric/Behavioral:  Negative for depression and suicidal ideas. The patient does not have insomnia.       No Known Allergies   Past Medical History:  Diagnosis Date   Alcohol abuse    pt states it is marijuana   Anemia    Arthritis    Bipolar disorder (HCC)    BPH (benign prostatic hypertrophy) with urinary obstruction    CAD (coronary artery disease)    a. 2006 PCI/DES to LAD, EF 60%.   Chronic heart failure with preserved ejection fraction (HFpEF) ([place])    a. 07/2014 Echo: EF 55%; b. 11/2020 Echo: EF 50-55%, mod LVH, GrI DD, mildly enlarged RV w/ nl fxn, mild BAE.   Chronic pancreatitis (HCC)    Cirrhosis ([place])    COPD (chronic obstructive pulmonary disease) ([place])    COVID-19    08/22/20   Dementia ([place])  early dementia   Depression    Dilated ascending aorta and aortic root ([place])    a. 11/2020 Echo: Ao root 11mm, Asc Ao 30mm. Ao arch 46mm.   Diverticulitis    12/06/20 [facility]    Diverticulosis    Drug use    Dyspnea    ED (erectile dysfunction)    Gastritis    GERD (gastroesophageal reflux disease)    Headache    occasional migraines   Hepatitis C     treated   History of lumbar fusion    Hyperlipidemia    Hypertension    patient denies having high blood pressure   Lung cancer ([place])    Pancreatitis    x 2    Pneumonia    07/06/18    Premature atrial contractions    a. 04/2021 [facility]: Freq PACs w/ 8% burden.   Prostate cancer [facility])    with seeds   PSVT (paroxysmal supraventricular tachycardia) ([place])    a. 04/2021 [facility]: Avg HR 80 (60-210). 42 SVT runs - longest 5 beats @ 116, fastest 210 x 5 beats. Freq PACs (8%).   PTSD (post-traumatic stress disorder)    Rib fracture    s/p fall 03/15/19    Sinus disease    Urge incontinence      Past Surgical History:  Procedure Laterality Date   CARDIAC CATHETERIZATION  2006   cateract  2013   COLONOSCOPY WITH PROPOFOL N/A 10/04/2019   Procedure: COLONOSCOPY WITH PROPOFOL;  Surgeon: Lucilla Lame, MD;  Location: [facility] ENDOSCOPY;  Service: Endoscopy;  Laterality: N/A;   CORONARY ANGIOPLASTY  2006   CORONARY STENT PLACEMENT  2006   x 1   CYSTOSCOPY W/ RETROGRADES N/A 08/08/2021   Procedure: CYSTOSCOPY WITH RETROGRADE PYELOGRAM;  Surgeon: Hollice Espy, MD;  Location: [facility] ORS;  Service: Urology;  Laterality: N/A;   CYSTOSCOPY WITH URETHRAL DILATATION N/A 08/08/2021   Procedure: CYSTOSCOPY WITH URETHRAL DILATATION WITH UROLUME BALLOON;  Surgeon: Hollice Espy, MD;  Location: [facility] ORS;  Service: Urology;  Laterality: N/A;   ESOPHAGOGASTRODUODENOSCOPY N/A 12/22/2014   Procedure: ESOPHAGOGASTRODUODENOSCOPY (EGD);  Surgeon: [name], MD;  Location[facility] ENDOSCOPY;  Service: Endoscopy;  Laterality: N/A;   ESOPHAGOGASTRODUODENOSCOPY (EGD) WITH PROPOFOL N/A 10/04/2019   Procedure: ESOPHAGOGASTRODUODENOSCOPY (EGD) WITH PROPOFOL;  Surgeon: Lucilla Lame, MD;  Location: [facility] ENDOSCOPY;  Service: Endoscopy;  Laterality: N/A;   HERNIA REPAIR  2015   left groin   INTERCOSTAL NERVE BLOCK Left 11/09/2019   Procedure: Intercostal Nerve Block;  Surgeon: Melrose Nakayama, MD;  Location: [place];   Service: Thoracic;  Laterality: Left;   LUMBAR FUSION     RADIOACTIVE SEED IMPLANT N/A 04/22/2016   Procedure: RADIOACTIVE SEED IMPLANT/BRACHYTHERAPY IMPLANT;  Surgeon: Hollice Espy, MD;  Location: [facility] ORS;  Service: Urology;  Laterality: N/A;   ROBOT ASSISTED INGUINAL HERNIA REPAIR Bilateral 07/06/2018   Procedure: ROBOT ASSISTED INGUINAL HERNIA REPAIR;  Surgeon: Jules Husbands, MD;  Location: [facility] ORS;  Service: General;  Laterality: Bilateral;   TONSILLECTOMY     UMBILICAL HERNIA REPAIR N/A 07/06/2018   Procedure: LAPAROSCOPIC ROBOT ASSISTED UMBILICAL HERNIA;  Surgeon: Jules Husbands, MD;  Location: [facility] ORS;  Service: General;  Laterality: N/A;   XI ROBOTIC ASSISTED THORACOSCOPY- SEGMENTECTOMY Left 11/09/2019   Procedure: XI ROBOTIC ASSISTED THORACOSCOPY-WEDGE RESECTION LEFT LOWER LOBE;  Surgeon: Melrose Nakayama, MD;  Location: [place];  Service: Thoracic;  Laterality: Left;    Social History   Socioeconomic History   Marital status: Divorced  Spouse name: Not on file   Number of children: 4   Years of education: Not on file   Highest education level: GED or equivalent  Occupational History   Occupation: Aeronautical engineer    Comment: retired  Tobacco Use   Smoking status: Every Day    Packs/day: 1.00    Years: 55.00    Pack years: 55.00    Types: Cigarettes    Start date: 07/28/1962   Smokeless tobacco: Never  Vaping Use   Vaping Use: Never used  Substance and Sexual Activity   Alcohol use: Yes    Alcohol/week: 1.0 standard drink    Types: 1 Cans of beer per week    Comment: 2-3 beers a day   Drug use: Yes    Frequency: 7.0 times per week    Types: Marijuana    Comment: Endorsed heroin 06/2014, UDS also + benzos, opiates, THC, neg for cocaine at that time.   Sexual activity: Not Currently  Other Topics Concern   Not on file  Social History Narrative   Lives at home    Has kids and grandkids   Smoker x 55 years 1 ppd as of 07/2019 he did quit x 2 years     Drinks 1 pt liq qd as of 07/2019    Social Determinants of Health   Financial Resource Strain: Low Risk    Difficulty of Paying Living Expenses: Not hard at all  Food Insecurity: No Food Insecurity   Worried About Charity fundraiser in the Last Year: Never true   Arboriculturist in the Last Year: Never true  Transportation Needs: No Transportation Needs   Lack of Transportation (Medical): No   Lack of Transportation (Non-Medical): No  Physical Activity: Not on file  Stress: No Stress Concern Present   Feeling of Stress : Not at all  Social Connections: Moderately Isolated   Frequency of Communication with Friends and Family: More than three times a week   Frequency of Social Gatherings with Friends and Family: Once a week   Attends Religious Services: Never   Marine scientist or Organizations: Yes   Attends Music therapist: More than 4 times per year   Marital Status: Divorced  Human resources officer Violence: Not At Risk   Fear of Current or Ex-Partner: No   Emotionally Abused: No   Physically Abused: No   Sexually Abused: No    Family History  Problem Relation Age of Onset   Heart disease Father        Unclear details. Father passed in late 42's 2/2 cancer.   Colon cancer Father    Alcohol abuse Sister    Drug abuse Sister    Anxiety disorder Sister    Depression Sister    COPD Brother    Obesity Brother    Kidney disease Neg Hx    Prostate cancer Neg Hx      Current Outpatient Medications:    albuterol (PROVENTIL) (2.5 MG/3ML) 0.083% nebulizer solution, Take 3 mLs (2.5 mg total) by nebulization every 6 (six) hours as needed for wheezing or shortness of breath., Disp: 375 mL, Rfl: 3   albuterol (VENTOLIN HFA) 108 (90 Base) MCG/ACT inhaler, Inhale 1-2 puffs into the lungs every 6 (six) hours as needed for wheezing or shortness of breath., Disp: 18 g, Rfl: 12   ARIPiprazole (ABILIFY) 10 MG tablet, 1 TAB BY MOUTH EVERY DAY AT BEDTIME, Disp: 28 tablet, Rfl:  2  aspirin EC 81 MG tablet, Take 81 mg by mouth daily., Disp: , Rfl:    Budeson-Glycopyrrol-Formoterol (BREZTRI AEROSPHERE) 160-9-4.8 MCG/ACT AERO, Inhale 2 puffs into the lungs in the morning and at bedtime., Disp: 10.7 g, Rfl: 11   Budeson-Glycopyrrol-Formoterol (BREZTRI AEROSPHERE) 160-9-4.8 MCG/ACT AERO, Inhale 2 puffs into the lungs in the morning and at bedtime., Disp: 5.9 g, Rfl: 0   celecoxib (CELEBREX) 200 MG capsule, TAKE 1 CAPSULE (200 MG TOTAL) BY MOUTH DAILY AFTER BREAKFAST., Disp: 28 capsule, Rfl: 0   clopidogrel (PLAVIX) 75 MG tablet, Take 1 tablet (75 mg total) by mouth daily., Disp: 90 tablet, Rfl: 3   Dextromethorphan-Guaifenesin 60-1200 MG 12hr tablet, Take 1 tablet by mouth every 12 (twelve) hours. Prn cough, Disp: 30 tablet, Rfl: 0   donepezil (ARICEPT) 5 MG tablet, Take 1 tablet (5 mg total) by mouth at bedtime. Further refills per [facility] neurology, Disp: 90 tablet, Rfl: 3   FLUoxetine (PROZAC) 40 MG capsule, 1 CAPSULE BY MOUTH EVERY MORNING, Disp: 28 capsule, Rfl: 2   fluticasone (FLONASE) 50 MCG/ACT nasal spray, Place 2 sprays into both nostrils daily. Prn after nasal saline, Disp: 16 g, Rfl: 11   memantine (NAMENDA) 5 MG tablet, TAKE 1 TABLET BY MOUTH TWICE A DAY, Disp: 56 tablet, Rfl: 2   methylPREDNISolone (MEDROL DOSEPAK) 4 MG TBPK tablet, 6 day dose pack - take as directed, Disp: 21 tablet, Rfl: 0   metoprolol succinate (TOPROL-XL) 50 MG 24 hr tablet, TAKE 1 TABLET DAILY, Disp: 90 tablet, Rfl: 3   Multiple Vitamins-Minerals (MULTIVITAMIN WITH MINERALS) tablet, Take 1 tablet by mouth daily., Disp: , Rfl:    MYRBETRIQ 50 MG TB24 tablet, Take by mouth., Disp: , Rfl:    ondansetron (ZOFRAN-ODT) 4 MG disintegrating tablet, Take 1 tablet (4 mg total) by mouth every 8 (eight) hours as needed for nausea or vomiting., Disp: 15 tablet, Rfl: 0   pantoprazole (PROTONIX) 40 MG tablet, TAKE 1 TABLET BY MOUTH EVERY MORNING 30 MINUTES PRIOR TO BREAKFAST, Disp: 28 tablet, Rfl: 2    rosuvastatin (CRESTOR) 10 MG tablet, Take 1 tablet (10 mg total) by mouth at bedtime., Disp: 90 tablet, Rfl: 3   sodium chloride (OCEAN) 0.65 % SOLN nasal spray, Place 2 sprays into both nostrils as needed for congestion., Disp: 30 mL, Rfl: 11   sucralfate (CARAFATE) 1 g tablet, TAKE 1 TABLET BY MOUTH FOUR TIMES A DAY, Disp: 120 tablet, Rfl: 11   tamsulosin (FLOMAX) 0.4 MG CAPS capsule, TAKE 1 CAPSULE BY MOUTH EVERY EVENING AFTER SUPPER, Disp: 28 capsule, Rfl: 2   traZODone (DESYREL) 100 MG tablet, Take 1 tablet (100 mg total) by mouth at bedtime as needed for sleep., Disp: 90 tablet, Rfl: 3   Vibegron (GEMTESA) 75 MG TABS, Take 75 mg by mouth daily., Disp: 30 tablet, Rfl: 0  Physical exam:  Vitals:   12/31/21 1121  BP: 111/78  Pulse: 72  Resp: 18  Temp: (!) 97.1 F (36.2 C)  TempSrc: Tympanic  SpO2: 94%  Weight: 265 lb (120.2 kg)   Physical Exam Constitutional:      General: He is not in acute distress. Cardiovascular:     Rate and Rhythm: Normal rate and regular rhythm.     Heart sounds: Normal heart sounds.  Pulmonary:     Effort: Pulmonary effort is normal.     Breath sounds: Normal breath sounds.  Abdominal:     General: Bowel sounds are normal.     Palpations: Abdomen is soft.  Skin:    General: Skin is warm and dry.  Neurological:     Mental Status: He is alert and oriented to person, place, and time.        Latest Ref Rng & Units 12/24/2021    2:25 PM  CMP  Glucose 70 - 99 mg/dL 99    BUN 8 - 23 mg/dL 17    Creatinine 0.61 - 1.24 mg/dL 1.32    Sodium 135 - 145 mmol/L 139    Potassium 3.5 - 5.1 mmol/L 4.1    Chloride 98 - 111 mmol/L 107    CO2 22 - 32 mmol/L 26    Calcium 8.9 - 10.3 mg/dL 8.8    Total Protein 6.5 - 8.1 g/dL 7.2    Total Bilirubin 0.3 - 1.2 mg/dL 0.2    Alkaline Phos 38 - 126 U/L 83    AST 15 - 41 U/L 19    ALT 0 - 44 U/L 17        Latest Ref Rng & Units 12/24/2021    2:25 PM  CBC  WBC 4.0 - 10.5 K/uL 8.8    Hemoglobin 13.0 - 17.0 g/dL  12.5    Hematocrit 39.0 - 52.0 % 36.6    Platelets 150 - 400 K/uL 290      No images are attached to the encounter.  CT Chest Wo Contrast  Result Date: 12/25/2021 CLINICAL DATA:  Follow-up lung cancer diagnosed 2021 status post left lower lobectomy. History of prostate cancer. * Tracking Code: BO * EXAM: CT CHEST WITHOUT CONTRAST TECHNIQUE: Multidetector CT imaging of the chest was performed following the standard protocol without IV contrast. RADIATION DOSE REDUCTION: This exam was performed according to the departmental dose-optimization program which includes automated exposure control, adjustment of the mA and/or kV according to patient size and/or use of iterative reconstruction technique. COMPARISON:  Chest CT June 13, 2021 FINDINGS: Cardiovascular: Aortic atherosclerosis. Three-vessel coronary artery calcifications stents/. Normal size heart. No significant pericardial effusion/thickening. Mediastinum/Nodes: No suspicious thyroid nodule. No pathologically enlarged mediastinal, hilar or axillary lymph nodes, noting limited sensitivity for the detection of hilar adenopathy on this noncontrast study. Esophagus is grossly unremarkable. Lungs/Pleura: Unchanged postoperative appearance of the dependent left lower lobe status post wedge resection with nodular scar tissue about the inferior aspect of the wedge resection line on image 125/3, unchanged from prior. Subpleural round atelectasis about the left lateral left lower lobe and lingula is also unchanged on image 110/3. Solid 3 mm pulmonary nodule in the posterior right lower lobe on image 98/3 is unchanged from prior. No new suspicious pulmonary nodules or masses. Moderate centrilobular and paraseptal emphysema with diffuse bronchial wall thickening. No pleural effusion or pneumothorax. Upper Abdomen: No acute abnormality. Musculoskeletal: Remote bilateral rib fractures. No aggressive lytic or blastic lesion of bone. Thoracic spondylosis.  IMPRESSION: 1. Unchanged postoperative appearance of the left lower lobe status post wedge resection with nodular scar tissue about the inferior aspect of the wedge resection line. Attention on follow-up imaging suggested. No convincing evidence of local recurrence or thoracic metastatic disease. 2. Moderate emphysema with diffuse bronchial wall thickening, suggesting COPD. 3. Three-vessel coronary artery calcifications/ stents. 4. Aortic Atherosclerosis (ICD10-I70.0) and Emphysema (ICD10-J43.9). Electronically Signed   By: Dahlia Bailiff M.D.   On: 12/25/2021 13:01     Assessment and plan- Patient is a 76 y.o. male with stage IA pT1b pN0 cM0 squamous cell carcinoma of the right lobe status post wedge resection.  He is here for routine  follow-up of lung cancer  Chronic in nature.  I reviewed his recent CT chest report as well as lab work and discussed these findings with patient.  CT scan continues to show postoperative changes in the left lower lobe s/p wedge resection.  Nodular scar tissue in the inferior aspect of the wedge resection but no overt concerning findings for recurrence.  Continue with plan to monitor every 6 months with chest CT and labs.  Smoking cessation encouraged    Visit Diagnosis 1. Encounter for follow-up surveillance of lung cancer   2. Squamous carcinoma of lung, left ([place])   3. Macrocytic anemia     Vallery Sa  [facility] [phone] 12/31/2021 11:39 AM

## 2021-12-31 NOTE — Progress Notes (Signed)
Return for lung CA follow-up and CT scan results. Denies any new concerns.

## 2022-01-03 ENCOUNTER — Ambulatory Visit (INDEPENDENT_AMBULATORY_CARE_PROVIDER_SITE_OTHER): Payer: Medicare HMO | Admitting: Internal Medicine

## 2022-01-03 ENCOUNTER — Telehealth: Payer: Self-pay

## 2022-01-03 ENCOUNTER — Encounter: Payer: Self-pay | Admitting: Internal Medicine

## 2022-01-03 DIAGNOSIS — R600 Localized edema: Secondary | ICD-10-CM

## 2022-01-03 DIAGNOSIS — K7030 Alcoholic cirrhosis of liver without ascites: Secondary | ICD-10-CM

## 2022-01-03 HISTORY — DX: Localized edema: R60.0

## 2022-01-03 MED ORDER — SPIRONOLACTONE 25 MG PO TABS: 12.5000 mg | ORAL_TABLET | Freq: Every day | ORAL | 3 refills | Status: DC | PRN

## 2022-01-03 MED ORDER — FUROSEMIDE 20 MG PO TABS: 10.0000 mg | ORAL_TABLET | Freq: Every day | ORAL | 3 refills | Status: DC | PRN

## 2022-01-03 NOTE — Telephone Encounter (Signed)
Called pt to start tele visit today 01/03/22 with Dr.Tracy @1pm . Unable to reach nor lvm.

## 2022-01-03 NOTE — Progress Notes (Signed)
Telephone Note  I connected with Christopher Orr  on 01/03/22 at  1:10 PM EDT by a video enabled telemedicine application and verified that I am speaking with the correct person using two identifiers.  Location patient: [place] Location provider:work or home office Persons participating in the virtual visit: patient, provider  I discussed the limitations and requested verbal permission for telemedicine visit. The patient expressed understanding and agreed to proceed.   HPI:  Acute telemedicine visit for : B/l ankle swelling outside of ankles consistent for weeks saw podiatry 09/2021 and had foot arthritis and given steroid shot which helps swelling painful and has trouble walking   -Pertinent past medical history: see below -Pertinent medication allergies:No Known Allergies -COVID-19 vaccine status:  Immunization History  Administered Date(s) Administered   Fluad Quad(high Dose 65+) 05/04/2019   Influenza, High Dose Seasonal PF 04/15/2019, 05/10/2020   Influenza,inj,Quad PF,6+ Mos 05/04/2018   Influenza-Unspecified 04/27/2021   PFIZER(Purple Top)SARS-COV-2 Vaccination 09/28/2019, 10/19/2019, 05/10/2020   Pneumococcal Conjugate-13 04/15/2019     ROS: See pertinent positives and negatives per HPI.  Past Medical History:  Diagnosis Date   Alcohol abuse    pt states it is marijuana   Anemia    Arthritis    Bipolar disorder (HCC)    BPH (benign prostatic hypertrophy) with urinary obstruction    CAD (coronary artery disease)    a. 2006 PCI/DES to LAD, EF 60%.   Chronic heart failure with preserved ejection fraction (HFpEF) ([place])    a. 07/2014 Echo: EF 55%; b. 11/2020 Echo: EF 50-55%, mod LVH, GrI DD, mildly enlarged RV w/ nl fxn, mild BAE.   Chronic pancreatitis (HCC)    Cirrhosis ([place])    COPD (chronic obstructive pulmonary disease) ([place])    COVID-19    08/22/20   Dementia ([place])    early dementia   Depression    Dilated ascending aorta and aortic root ([place])    a. 11/2020 Echo: Ao  root 52mm, Asc Ao 63mm. Ao arch 86mm.   Diverticulitis    12/06/20 [facility]    Diverticulosis    Drug use    Dyspnea    ED (erectile dysfunction)    Gastritis    GERD (gastroesophageal reflux disease)    Headache    occasional migraines   Hepatitis C    treated   History of lumbar fusion    Hyperlipidemia    Hypertension    patient denies having high blood pressure   Lung cancer ([place])    Pancreatitis    x 2    Pneumonia    07/06/18    Premature atrial contractions    a. 04/2021 [facility]: Freq PACs w/ 8% burden.   Prostate cancer [facility])    with seeds   PSVT (paroxysmal supraventricular tachycardia) ([place])    a. 04/2021 [facility]: Avg HR 80 (60-210). 42 SVT runs - longest 5 beats @ 116, fastest 210 x 5 beats. Freq PACs (8%).   PTSD (post-traumatic stress disorder)    Rib fracture    s/p fall 03/15/19    Sinus disease    Urge incontinence     Past Surgical History:  Procedure Laterality Date   CARDIAC CATHETERIZATION  2006   cateract  2013   COLONOSCOPY WITH PROPOFOL N/A 10/04/2019   Procedure: COLONOSCOPY WITH PROPOFOL;  Surgeon: Lucilla Lame, MD;  Location: [facility] ENDOSCOPY;  Service: Endoscopy;  Laterality: N/A;   CORONARY ANGIOPLASTY  2006   CORONARY STENT PLACEMENT  2006   x 1  CYSTOSCOPY W/ RETROGRADES N/A 08/08/2021   Procedure: CYSTOSCOPY WITH RETROGRADE PYELOGRAM;  Surgeon: Hollice Espy, MD;  Location: [facility] ORS;  Service: Urology;  Laterality: N/A;   CYSTOSCOPY WITH URETHRAL DILATATION N/A 08/08/2021   Procedure: CYSTOSCOPY WITH URETHRAL DILATATION WITH UROLUME BALLOON;  Surgeon: Hollice Espy, MD;  Location: [facility] ORS;  Service: Urology;  Laterality: N/A;   ESOPHAGOGASTRODUODENOSCOPY N/A 12/22/2014   Procedure: ESOPHAGOGASTRODUODENOSCOPY (EGD);  Surgeon: [name], MD;  Location: [facility] ENDOSCOPY;  Service: Endoscopy;  Laterality: N/A;   ESOPHAGOGASTRODUODENOSCOPY (EGD) WITH PROPOFOL N/A 10/04/2019   Procedure: ESOPHAGOGASTRODUODENOSCOPY (EGD) WITH PROPOFOL;  Surgeon:  Lucilla Lame, MD;  Location: [facility] ENDOSCOPY;  Service: Endoscopy;  Laterality: N/A;   HERNIA REPAIR  2015   left groin   INTERCOSTAL NERVE BLOCK Left 11/09/2019   Procedure: Intercostal Nerve Block;  Surgeon: Melrose Nakayama, MD;  Location: [place];  Service: Thoracic;  Laterality: Left;   LUMBAR FUSION     RADIOACTIVE SEED IMPLANT N/A 04/22/2016   Procedure: RADIOACTIVE SEED IMPLANT/BRACHYTHERAPY IMPLANT;  Surgeon: Hollice Espy, MD;  Location: [facility] ORS;  Service: Urology;  Laterality: N/A;   ROBOT ASSISTED INGUINAL HERNIA REPAIR Bilateral 07/06/2018   Procedure: ROBOT ASSISTED INGUINAL HERNIA REPAIR;  Surgeon: Jules Husbands, MD;  Location: [facility] ORS;  Service: General;  Laterality: Bilateral;   TONSILLECTOMY     UMBILICAL HERNIA REPAIR N/A 07/06/2018   Procedure: LAPAROSCOPIC ROBOT ASSISTED UMBILICAL HERNIA;  Surgeon: Jules Husbands, MD;  Location: [facility] ORS;  Service: General;  Laterality: N/A;   XI ROBOTIC ASSISTED THORACOSCOPY- SEGMENTECTOMY Left 11/09/2019   Procedure: XI ROBOTIC ASSISTED THORACOSCOPY-WEDGE RESECTION LEFT LOWER LOBE;  Surgeon: Melrose Nakayama, MD;  [facility];  Service: Thoracic;  Laterality: Left;     Current Outpatient Medications:    albuterol (PROVENTIL) (2.5 MG/3ML) 0.083% nebulizer solution, Take 3 mLs (2.5 mg total) by nebulization every 6 (six) hours as needed for wheezing or shortness of breath., Disp: 375 mL, Rfl: 3   albuterol (VENTOLIN HFA) 108 (90 Base) MCG/ACT inhaler, Inhale 1-2 puffs into the lungs every 6 (six) hours as needed for wheezing or shortness of breath., Disp: 18 g, Rfl: 12   ARIPiprazole (ABILIFY) 10 MG tablet, 1 TAB BY MOUTH EVERY DAY AT BEDTIME, Disp: 28 tablet, Rfl: 2   aspirin EC 81 MG tablet, Take 81 mg by mouth daily., Disp: , Rfl:    Budeson-Glycopyrrol-Formoterol (BREZTRI AEROSPHERE) 160-9-4.8 MCG/ACT AERO, Inhale 2 puffs into the lungs in the morning and at bedtime., Disp: 10.7 g, Rfl: 11   Budeson-Glycopyrrol-Formoterol  (BREZTRI AEROSPHERE) 160-9-4.8 MCG/ACT AERO, Inhale 2 puffs into the lungs in the morning and at bedtime., Disp: 5.9 g, Rfl: 0   celecoxib (CELEBREX) 200 MG capsule, TAKE 1 CAPSULE (200 MG TOTAL) BY MOUTH DAILY AFTER BREAKFAST., Disp: 28 capsule, Rfl: 0   clopidogrel (PLAVIX) 75 MG tablet, Take 1 tablet (75 mg total) by mouth daily., Disp: 90 tablet, Rfl: 3   Dextromethorphan-Guaifenesin 60-1200 MG 12hr tablet, Take 1 tablet by mouth every 12 (twelve) hours. Prn cough, Disp: 30 tablet, Rfl: 0   donepezil (ARICEPT) 5 MG tablet, Take 1 tablet (5 mg total) by mouth at bedtime. Further refills per [facility] neurology, Disp: 90 tablet, Rfl: 3   FLUoxetine (PROZAC) 40 MG capsule, 1 CAPSULE BY MOUTH EVERY MORNING, Disp: 28 capsule, Rfl: 2   fluticasone (FLONASE) 50 MCG/ACT nasal spray, Place 2 sprays into both nostrils daily. Prn after nasal saline, Disp: 16 g, Rfl: 11   metoprolol succinate (TOPROL-XL)  50 MG 24 hr tablet, TAKE 1 TABLET DAILY, Disp: 90 tablet, Rfl: 3   Multiple Vitamins-Minerals (MULTIVITAMIN WITH MINERALS) tablet, Take 1 tablet by mouth daily., Disp: , Rfl:    MYRBETRIQ 50 MG TB24 tablet, Take by mouth., Disp: , Rfl:    ondansetron (ZOFRAN-ODT) 4 MG disintegrating tablet, Take 1 tablet (4 mg total) by mouth every 8 (eight) hours as needed for nausea or vomiting., Disp: 15 tablet, Rfl: 0   pantoprazole (PROTONIX) 40 MG tablet, TAKE 1 TABLET BY MOUTH EVERY MORNING 30 MINUTES PRIOR TO BREAKFAST, Disp: 28 tablet, Rfl: 2   rosuvastatin (CRESTOR) 10 MG tablet, Take 1 tablet (10 mg total) by mouth at bedtime., Disp: 90 tablet, Rfl: 3   sodium chloride (OCEAN) 0.65 % SOLN nasal spray, Place 2 sprays into both nostrils as needed for congestion., Disp: 30 mL, Rfl: 11   sucralfate (CARAFATE) 1 g tablet, TAKE 1 TABLET BY MOUTH FOUR TIMES A DAY, Disp: 120 tablet, Rfl: 11   tamsulosin (FLOMAX) 0.4 MG CAPS capsule, TAKE 1 CAPSULE BY MOUTH EVERY EVENING AFTER SUPPER, Disp: 28 capsule, Rfl: 2   traZODone  (DESYREL) 100 MG tablet, Take 1 tablet (100 mg total) by mouth at bedtime as needed for sleep., Disp: 90 tablet, Rfl: 3   Vibegron (GEMTESA) 75 MG TABS, Take 75 mg by mouth daily., Disp: 30 tablet, Rfl: 0   furosemide (LASIX) 20 MG tablet, Take 0.5 tablets (10 mg total) by mouth daily as needed. In am, Disp: 30 tablet, Rfl: 3   memantine (NAMENDA) 5 MG tablet, TAKE 1 TABLET BY MOUTH TWICE A DAY, Disp: 56 tablet, Rfl: 2   methylPREDNISolone (MEDROL DOSEPAK) 4 MG TBPK tablet, 6 day dose pack - take as directed (Patient not taking: Reported on 01/03/2022), Disp: 21 tablet, Rfl: 0   spironolactone (ALDACTONE) 25 MG tablet, Take 0.5 tablets (12.5 mg total) by mouth daily as needed. In am, Disp: 45 tablet, Rfl: 3  EXAM:  VITALS per patient if applicable:  GENERAL: alert, oriented, appears well and in no acute distress   MS: moves all visible extremities without noticeable abnormality  PSYCH/NEURO: pleasant and cooperative, no obvious depression or anxiety, speech and thought processing grossly intact  ASSESSMENT AND PLAN:  Discussed the following assessment and plan:  Alcoholic cirrhosis, suspect ankle foot swelling related to this echo 0/0762 diastolic dysfunction labs 12/24/21 no elevated lfts gfr 56 other ddx lymphedema - Plan: Basic Metabolic Panel (BMET), furosemide (LASIX) 10 MG tablet, spironolactone (ALDACTONE) 12.5 MG tablet qam  Monitor BP Leg edema - see above  -we discussed possible serious and likely etiologies, options for evaluation and workup, limitations of telemedicine visit vs in person visit, treatment, treatment risks and precautions. Pt is agreeable to treatment via telemedicine at this moment.     I discussed the assessment and treatment plan with the patient. The patient was provided an opportunity to ask questions and all were answered. The patient agreed with the plan and demonstrated an understanding of the instructions.    Time spent 15 minutes Delorise Jackson, MD

## 2022-01-07 DIAGNOSIS — F10220 Alcohol dependence with intoxication, uncomplicated: Secondary | ICD-10-CM | POA: Diagnosis not present

## 2022-01-07 DIAGNOSIS — F330 Major depressive disorder, recurrent, mild: Secondary | ICD-10-CM | POA: Diagnosis not present

## 2022-01-10 ENCOUNTER — Other Ambulatory Visit: Payer: Medicare HMO

## 2022-01-13 ENCOUNTER — Other Ambulatory Visit: Payer: Self-pay | Admitting: Internal Medicine

## 2022-01-13 DIAGNOSIS — M159 Polyosteoarthritis, unspecified: Secondary | ICD-10-CM

## 2022-01-20 DIAGNOSIS — F10220 Alcohol dependence with intoxication, uncomplicated: Secondary | ICD-10-CM | POA: Diagnosis not present

## 2022-01-20 DIAGNOSIS — F330 Major depressive disorder, recurrent, mild: Secondary | ICD-10-CM | POA: Diagnosis not present

## 2022-01-30 ENCOUNTER — Encounter: Payer: Self-pay | Admitting: Pulmonary Disease

## 2022-01-30 ENCOUNTER — Ambulatory Visit: Payer: Medicare HMO | Admitting: Pulmonary Disease

## 2022-01-30 VITALS — BP 118/74 | HR 84 | Temp 97.9°F | Ht 72.0 in | Wt 264.6 lb

## 2022-01-30 DIAGNOSIS — R0609 Other forms of dyspnea: Secondary | ICD-10-CM

## 2022-01-30 DIAGNOSIS — F17210 Nicotine dependence, cigarettes, uncomplicated: Secondary | ICD-10-CM | POA: Diagnosis not present

## 2022-01-30 DIAGNOSIS — G4734 Idiopathic sleep related nonobstructive alveolar hypoventilation: Secondary | ICD-10-CM

## 2022-01-30 DIAGNOSIS — C3492 Malignant neoplasm of unspecified part of left bronchus or lung: Secondary | ICD-10-CM

## 2022-01-30 DIAGNOSIS — J449 Chronic obstructive pulmonary disease, unspecified: Secondary | ICD-10-CM

## 2022-01-30 NOTE — Progress Notes (Signed)
Subjective:    Patient ID: Christopher Orr, male    DOB: 05-29-1946, 76 y.o.   MRN: [id number] Patient Care Team: McLean-Scocuzza, [name], MD as PCP - General (Internal Medicine) Wellington Hampshire, MD as PCP - Cardiology (Cardiology) Vickie Epley, MD as PCP - Electrophysiology (Cardiology) Telford Nab, RN as Oncology Nurse Navigator  Chief Complaint  Patient presents with   Follow-up    SOB with exertion and prod cough with clear sputum.     HPI This is a 76 year old current smoker (1 PPD) who presents for follow-up on the issue of stage II COPD and dyspnea.  He was last evaluated here on 31 October 2021, at that time was given samples of Breztri and sent prescriptions to his pharmacy.  He was also instructed to curtail use of caffeine after 3 PM in the afternoon.  It was recommended that he take melatonin 1 hour before bedtime to help him with sleep.  He has significant overnight oxygen desaturations with great variability noted on overnight oximetry.  He was placed on oxygen at nighttime.  He continues to have difficulties with sleep particularly nonrestorative sleep.  He was reluctant to undergo a sleep study as he does not want to wear CPAP.  He was placed on oxygen supplementation to see if this would take care of his nocturnal hypoxemia which is quite severe.  He has not noticed much improvement with the therapy.  I suspect that this is likely due to severe underlying sleep apnea.  With regards to his COPD symptoms he feels that Judithann Sauger is keeping his symptoms controlled.  Sputum is less tenacious on the medication.  Does not feel as winded when ambulating.   DATA 08 September 2019 preoperative PFTs: FEV1 2.43 L or 71% predicted.  FVC 3.88 L or 83% predicted.  FEV1/FVC 63%.  No bronchodilator response.  Lung volumes normal.  Diffusion capacity 53%.  Insistent with moderate obstructive defect, diffusion capacity decreased suggestive of emphysema 27 December 2019 ambulatory oximetry: No  desaturation with exercise oxygen saturations remained 95 to 96% 17 May 2020 CT chest: Status post wedge resection left lower lobe pulmonary nodule.  Irregular parenchymal density surrounding the clips, no evidence of metastatic disease elsewhere, small loculated pleural effusion, attention on follow-up recommended 05 November 2020 PFTs: FEV1 2.0 L or 59% predicted, FVC 3.58 L or 77% predicted, FEV1/FVC 56%, there was significant bronchodilator response with 14% net change postbronchodilator on FEV1.  Mild air trapping.  No hyperinflation.  Diffusion capacity moderately reduced.  Consistent with moderate COPD on the basis of emphysema with airways reversibility 12 Dec 2020 echocardiogram: LVEF 50 to 55%, LV with low normal function.  Grade I DD.  Mildly enlarged RV.  Mildly dilated LV and RV.  Mild  dilatation of the aortic root 13 June 2021 CT chest without contrast: Moderate centrilobular and paraseptal emphysema, diffuse bronchial thickening status post left lower lobe wedge resection with nodular scar tissue, subpleural round atelectasis about lateral left lower lobe and lingula, no evidence of recurrence.  Incidental finding of coronary artery disease (three-vessel coronary artery calcifications and stents)  Review of Systems A 10 point review of systems was performed and it is as noted above otherwise negative.  Patient Active Problem List   Diagnosis Date Noted   Leg edema 01/03/2022   Right ear pain 07/18/2021   Viral upper respiratory tract infection 07/18/2021   Alcohol-induced mood disorder ([place]) 06/30/2021   Alcohol intoxication ([place]) 06/30/2021   Bipolar disorder (  [place]) 01/29/2021   Dizziness 01/29/2021   Syncope 01/29/2021   Sinus bradycardia 01/29/2021   Tetrahydrocannabinol (THC) use disorder, mild, abuse 01/29/2021   Other chronic pain 01/29/2021   Internal hemorrhoids 01/29/2021   Syncope due to orthostatic hypotension 01/23/2021   Chronic bilateral low back pain without  sciatica 12/27/2020   S/P lumbar fusion 12/27/2020   Alcohol abuse 11/01/2020   Malignant neoplasm of lower lobe of left lung ([place]) 05/15/2020   SOB (shortness of breath) on exertion 05/15/2020   Late onset Alzheimer's disease without behavioral disturbance ([place]) 05/07/2020   Squamous carcinoma of lung, left ([place]) 11/26/2019   Goals of care, counseling/discussion 11/26/2019   Nodule of lower lobe of left lung 11/09/2019   Lung cancer ([place]) 11/01/2019   Abnormal findings on diagnostic imaging of digestive system    Polyp of transverse colon    Prediabetes 09/14/2019   Kidney lesion 08/15/2019   History of prostate cancer 08/15/2019   Arthritis 08/15/2019   Hernia, inguinal, bilateral 08/15/2019   History of pancreatitis 08/15/2019   Cirrhosis of liver ([place]) 08/15/2019   Insomnia 08/15/2019   Memory loss 08/15/2019   Nodule of middle lobe of right lung 08/15/2019   Tobacco abuse 08/15/2019   CAD (coronary artery disease)    Degenerative disc disease, lumbar 11/01/2018   Cigarette smoker 08/05/2018   Acute respiratory failure with hypoxia ([place]) 07/06/2018   Bilateral inguinal hernia, without obstruction or gangrene, recurrent    Umbilical hernia without obstruction and without gangrene    Panic attacks 06/14/2018   Benzodiazepine dependence ([place]) 06/14/2018   Post traumatic stress disorder (PTSD) 06/14/2018   Depression, recurrent ([place]) 06/14/2018   Coronary artery disease involving native heart with angina pectoris ([place]) 05/31/2018   Osteoarthritis of multiple joints 05/31/2018   Hyperlipidemia 05/31/2018   Chronic viral hepatitis C ([place]) 09/24/2014   Chest pain 07/28/2014   COPD (chronic obstructive pulmonary disease) ([place]) 07/28/2014   Anxiety and depression 07/28/2014   Aspiration pneumonia ([place]) 07/28/2014   Elevated troponin I level 07/28/2014   Acute kidney injury ([place]) 07/28/2014   GERD (gastroesophageal reflux disease) 07/28/2014   Social History   Tobacco Use    Smoking status: Every Day    Packs/day: 1.50    Years: 55.00    Total pack years: 82.50    Types: Cigarettes    Start date: 07/28/1962   Smokeless tobacco: Never   Tobacco comments:    1PPD 01/30/2022  Substance Use Topics   Alcohol use: Yes    Alcohol/week: 1.0 standard drink of alcohol    Types: 1 Cans of beer per week    Comment: 2-3 beers a day   No Known Allergies  Current Meds  Medication Sig   albuterol (PROVENTIL) (2.5 MG/3ML) 0.083% nebulizer solution Take 3 mLs (2.5 mg total) by nebulization every 6 (six) hours as needed for wheezing or shortness of breath.   albuterol (VENTOLIN HFA) 108 (90 Base) MCG/ACT inhaler Inhale 1-2 puffs into the lungs every 6 (six) hours as needed for wheezing or shortness of breath.   ARIPiprazole (ABILIFY) 10 MG tablet 1 TAB BY MOUTH EVERY DAY AT BEDTIME   aspirin EC 81 MG tablet Take 81 mg by mouth daily.   Budeson-Glycopyrrol-Formoterol (BREZTRI AEROSPHERE) 160-9-4.8 MCG/ACT AERO Inhale 2 puffs into the lungs in the morning and at bedtime.   celecoxib (CELEBREX) 200 MG capsule TAKE 1 CAPSULE BY MOUTH DAILY AFTER BREAKFAST   clopidogrel (PLAVIX) 75 MG tablet Take 1 tablet (75 mg total)  by mouth daily.   Dextromethorphan-Guaifenesin 60-1200 MG 12hr tablet Take 1 tablet by mouth every 12 (twelve) hours. Prn cough   donepezil (ARICEPT) 5 MG tablet Take 1 tablet (5 mg total) by mouth at bedtime. Further refills per [facility] neurology   FLUoxetine (PROZAC) 40 MG capsule 1 CAPSULE BY MOUTH EVERY MORNING   fluticasone (FLONASE) 50 MCG/ACT nasal spray Place 2 sprays into both nostrils daily. Prn after nasal saline   furosemide (LASIX) 20 MG tablet Take 0.5 tablets (10 mg total) by mouth daily as needed. In am   memantine (NAMENDA) 5 MG tablet TAKE 1 TABLET BY MOUTH TWICE A DAY   methylPREDNISolone (MEDROL DOSEPAK) 4 MG TBPK tablet 6 day dose pack - take as directed   metoprolol succinate (TOPROL-XL) 50 MG 24 hr tablet TAKE 1 TABLET DAILY   Multiple  Vitamins-Minerals (MULTIVITAMIN WITH MINERALS) tablet Take 1 tablet by mouth daily.   MYRBETRIQ 50 MG TB24 tablet Take by mouth.   ondansetron (ZOFRAN-ODT) 4 MG disintegrating tablet Take 1 tablet (4 mg total) by mouth every 8 (eight) hours as needed for nausea or vomiting.   pantoprazole (PROTONIX) 40 MG tablet TAKE 1 TABLET BY MOUTH EVERY MORNING 30 MINUTES PRIOR TO BREAKFAST   rosuvastatin (CRESTOR) 10 MG tablet Take 1 tablet (10 mg total) by mouth at bedtime.   sodium chloride (OCEAN) 0.65 % SOLN nasal spray Place 2 sprays into both nostrils as needed for congestion.   spironolactone (ALDACTONE) 25 MG tablet Take 0.5 tablets (12.5 mg total) by mouth daily as needed. In am   sucralfate (CARAFATE) 1 g tablet TAKE 1 TABLET BY MOUTH FOUR TIMES A DAY   tamsulosin (FLOMAX) 0.4 MG CAPS capsule TAKE 1 CAPSULE BY MOUTH EVERY EVENING AFTER SUPPER   traZODone (DESYREL) 100 MG tablet Take 1 tablet (100 mg total) by mouth at bedtime as needed for sleep.   Vibegron (GEMTESA) 75 MG TABS Take 75 mg by mouth daily.       Immunization History  Administered Date(s) Administered   Fluad Quad(high Dose 65+) 05/04/2019   Influenza, High Dose Seasonal PF 04/15/2019, 05/10/2020   Influenza,inj,Quad PF,6+ Mos 05/04/2018   Influenza-Unspecified 04/27/2021   PFIZER(Purple Top)SARS-COV-2 Vaccination 09/28/2019, 10/19/2019, 05/10/2020   Pneumococcal Conjugate-13 04/15/2019        Objective:   Physical Exam BP 118/74 (BP Location: Left Arm, Cuff Size: Large)   Pulse 84   Temp 97.9 F (36.6 C) (Temporal)   Ht 6' (1.829 m)   Wt 264 lb 9.6 oz (120 kg)   SpO2 92%   BMI 35.89 kg/m  GENERAL: Obese gentleman, no acute respiratory distress.  Fully ambulatory.  Smells heavily of tobacco.  Rudy complexion, no respiratory distress  HEAD: Normocephalic, atraumatic.  EYES: Pupils equal, round, reactive to light.  No scleral icterus.  MOUTH: Nose/mouth/throat not examined due to masking requirements for COVID  19. NECK: Supple.  Thick neck.  No thyromegaly. Trachea midline. No JVD.  No adenopathy. PULMONARY: Good air entry bilaterally.  Scattered rhonchi and wheezes throughout.  CARDIOVASCULAR: S1 and S2. Regular rate and rhythm.  No rubs murmurs or gallops heard. GASTROINTESTINAL: Obese, otherwise, benign. MUSCULOSKELETAL: No joint deformity, no clubbing, no edema.  NEUROLOGIC: No overt focal deficits. Speech is fluent.  No gait disturbance noticed on ambulation. SKIN: Intact,warm,dry. Nicotine stained fingers on right hand. PSYCH: Normal mood and affect.  Seems befuddled at times.     Assessment & Plan:     ICD-10-CM   1. Stage 2 moderate  COPD by GOLD classification ([place])  J44.9 Pulse oximetry, overnight   Appears to be fairly well compensated Continue Breztri 2 puffs twice a day Continue as needed albuterol    2. Nocturnal hypoxemia  G47.34    Reassess oximetry on oxygen supplementation May need formal sleep study if persistent hypoxemia    3. Squamous carcinoma of lung, left (HCC)  C34.92    Status post left lower wedge resection He follows with oncology in this regard CT chest May 2023 showed no recurrence Scarring noted, he is on surveillance    4. Tobacco dependence due to cigarettes  F17.210    Patient was counseled regards to discontinuation of smoking  Total counseling time 3 to 5 minutes     Orders Placed This Encounter  Procedures   Pulse oximetry, overnight    On roomair  DME:new start    Standing Status:   Future    Standing Expiration Date:   01/31/2023   We reiterated instructions for Breztri with the patient.  He replicated the maneuvers well.  We will check overnight oximetry on current oxygen supplementation.  If he continues to have great variability on the saturations and continues to desaturate despite O2 he will need sleep study to further evaluate the nature of his hypoxemia.  He is having some cognitive impairment which may be related to nocturnal  hypoxemia.  We will see him in follow-up in 4 months time he is to contact us prior to that time should any new difficulties arise.  Renold Don, MD Advanced Bronchoscopy PCCM [facility]    *This note was dictated using voice recognition software/Dragon.  Despite best efforts to proofread, errors can occur which can change the meaning. Any transcriptional errors that result from this process are unintentional and may not be fully corrected at the time of dictation.

## 2022-01-30 NOTE — Patient Instructions (Signed)
Continue using your Breztri (yellow inhaler).  Note that this is 2 puffs twice a day.  We are going to get an overnight oxygen test that will be done in your home this way we can monitor what your oxygen level is doing during sleep.  This will also help [place] tell you how much oxygen you really need at nighttime.  We will see you in follow-up in 4 months time call sooner should any new problems arise.

## 2022-02-12 ENCOUNTER — Other Ambulatory Visit: Payer: Self-pay | Admitting: Internal Medicine

## 2022-02-12 DIAGNOSIS — M159 Polyosteoarthritis, unspecified: Secondary | ICD-10-CM

## 2022-02-12 DIAGNOSIS — N400 Enlarged prostate without lower urinary tract symptoms: Secondary | ICD-10-CM

## 2022-02-12 DIAGNOSIS — R4189 Other symptoms and signs involving cognitive functions and awareness: Secondary | ICD-10-CM

## 2022-02-13 DIAGNOSIS — J449 Chronic obstructive pulmonary disease, unspecified: Secondary | ICD-10-CM | POA: Diagnosis not present

## 2022-02-25 DIAGNOSIS — F10220 Alcohol dependence with intoxication, uncomplicated: Secondary | ICD-10-CM | POA: Diagnosis not present

## 2022-02-25 DIAGNOSIS — F330 Major depressive disorder, recurrent, mild: Secondary | ICD-10-CM | POA: Diagnosis not present

## 2022-02-28 ENCOUNTER — Telehealth: Payer: Self-pay

## 2022-02-28 DIAGNOSIS — J449 Chronic obstructive pulmonary disease, unspecified: Secondary | ICD-10-CM

## 2022-02-28 NOTE — Telephone Encounter (Signed)
ONO reviewed by Dr. Patsey Berthold- recommend 2L QHS.   Spoke to patient and relayed below message/recommendations. He stated that he has a  concentrator and he will start wearing 2L QHS.  Order placed for repeat ONO.  Nothing further needed.

## 2022-03-06 ENCOUNTER — Encounter: Payer: Self-pay | Admitting: Pulmonary Disease

## 2022-03-07 ENCOUNTER — Other Ambulatory Visit: Payer: Self-pay | Admitting: Internal Medicine

## 2022-03-07 DIAGNOSIS — K219 Gastro-esophageal reflux disease without esophagitis: Secondary | ICD-10-CM

## 2022-03-07 DIAGNOSIS — R4189 Other symptoms and signs involving cognitive functions and awareness: Secondary | ICD-10-CM

## 2022-03-07 DIAGNOSIS — M159 Polyosteoarthritis, unspecified: Secondary | ICD-10-CM

## 2022-03-10 ENCOUNTER — Other Ambulatory Visit: Payer: Self-pay | Admitting: Internal Medicine

## 2022-03-10 DIAGNOSIS — J449 Chronic obstructive pulmonary disease, unspecified: Secondary | ICD-10-CM | POA: Diagnosis not present

## 2022-03-10 DIAGNOSIS — R4189 Other symptoms and signs involving cognitive functions and awareness: Secondary | ICD-10-CM

## 2022-03-25 ENCOUNTER — Telehealth: Payer: Self-pay

## 2022-03-25 DIAGNOSIS — Z9189 Other specified personal risk factors, not elsewhere classified: Secondary | ICD-10-CM

## 2022-03-25 NOTE — Telephone Encounter (Signed)
ONO reviewed by Dr. Patsey Berthold- even with o2 he continues to have oxygen desaturations. Recommend split night sleep study in lab.   Lm for patient.

## 2022-03-26 NOTE — Telephone Encounter (Signed)
Spoke to patient and relayed below results/recommendations. He does not wish to proceed with in lab sleep study, however he would be willing to do at home study.  Dr. Patsey Berthold, please advise. Thanks

## 2022-03-26 NOTE — Telephone Encounter (Signed)
Unfortunately in this situation and in-home study would not be helpful because of the his severe desaturations with oxygen.  He needs to have an in lab study.

## 2022-03-26 NOTE — Telephone Encounter (Signed)
Spoke to patient and relayed below message/recommendations. He voiced his understanding and agreed with sleep study.  Order has been placed.  Nothing further needed

## 2022-03-27 ENCOUNTER — Other Ambulatory Visit: Payer: Self-pay | Admitting: *Deleted

## 2022-03-27 DIAGNOSIS — C61 Malignant neoplasm of prostate: Secondary | ICD-10-CM

## 2022-03-28 ENCOUNTER — Other Ambulatory Visit: Payer: Medicare HMO

## 2022-03-28 ENCOUNTER — Encounter: Payer: Self-pay | Admitting: Urology

## 2022-03-28 DIAGNOSIS — C61 Malignant neoplasm of prostate: Secondary | ICD-10-CM

## 2022-03-29 LAB — PSA: Prostate Specific Ag, Serum: <0.1 ng/mL (ref 0.0–4.0)

## 2022-03-31 DIAGNOSIS — J440 Chronic obstructive pulmonary disease with acute lower respiratory infection: Secondary | ICD-10-CM | POA: Diagnosis not present

## 2022-03-31 DIAGNOSIS — R0602 Shortness of breath: Secondary | ICD-10-CM | POA: Diagnosis not present

## 2022-04-01 ENCOUNTER — Ambulatory Visit: Payer: Medicare HMO | Admitting: Urology

## 2022-04-01 VITALS — BP 126/77 | HR 79 | Ht 72.0 in | Wt 268.0 lb

## 2022-04-01 DIAGNOSIS — Z8546 Personal history of malignant neoplasm of prostate: Secondary | ICD-10-CM | POA: Diagnosis not present

## 2022-04-01 DIAGNOSIS — N35812 Other urethral bulbous stricture, male: Secondary | ICD-10-CM

## 2022-04-01 DIAGNOSIS — N3941 Urge incontinence: Secondary | ICD-10-CM

## 2022-04-01 DIAGNOSIS — C61 Malignant neoplasm of prostate: Secondary | ICD-10-CM

## 2022-04-01 LAB — BLADDER SCAN AMB NON-IMAGING

## 2022-04-01 NOTE — Progress Notes (Signed)
04/01/2022 1:59 PM   Christopher Orr [birth year] [id number]  Referring provider: McLean-Scocuzza, [name], MD [place],  [place] [address]  Chief Complaint  Patient presents with   Urinary Incontinence    HPI: 76 year old male with a personal history of prostate cancer, urethral stricture and renal cyst who presents today for 4-month follow-up.  He was diagnosed with prostate cancer on 12/20/2015. Pathology revealed low risk prostate cancer with Gleason 3+3 involving 7 our of 12 cores  including all cores on left and single core at right apex up to 70% tissue.  TRUS volume 42 cc. He is s/p brachytherapy seed implant on 04/22/2016.   PSA remains undetectable as of 03/28/2022.   He underwent an MRI on 12/27/2020 for further evaluation of lesion on the right  kidney. It showed that the lesion of concern in the right kidney anteriorly has reduced complexity compared to previously, and is currently categorized as a Bosniak category 2 complex but benign cyst. Bosniak category 1 and category 2 cysts are present in the kidneys.   He underwent a cystoscopy on 07/11/2021 and was found to have a bulbar urethral stricture. He was taken to the OR on 08/08/2021 for urethral dilation via optilume balloon. Intraoperative findings: Tight approximately 8 French bulbar urethral stricture through which an open-ended ureteral catheter was able to be passed in bluntly dilated with the cystoscope, relatively soft and short.  Trabeculated bladder.  Unremarkable bilateral retrograde pyelogram.    He continues to have baseline urinary symptoms, IPSS as below.  Past, some of this has been attributed to his ongoing alcohol intake.  He also is scheduled for sleep study for ongoing oxygen desaturations at nighttime.  He reports today that he is cut back on his drinking and is smoking more pot.  He continues to smoke cigarettes.  Primary complaints are urgency frequency with occasional urge incontinence.  He  does that he is doing better since bulbar urethral stricture dilation.  PVR today 0.    He has tried and failed oral agents including Gemtesa,.      He is currently on Myrbetriq and Flomax.  PVR today 0.   IPSS     Row Name 04/01/22 1345         International Prostate Symptom Score   How often have you had the sensation of not emptying your bladder? Almost always     How often have you had to urinate less than every two hours? More than half the time     How often have you found you stopped and started again several times when you urinated? Not at All     How often have you found it difficult to postpone urination? More than half the time     How often have you had a weak urinary stream? Not at All     How often have you had to strain to start urination? Not at All     How many times did you typically get up at night to urinate? 3 Times     Total IPSS Score 16       Quality of Life due to urinary symptoms   If you were to spend the rest of your life with your urinary condition just the way it is now how would you feel about that? Mixed             Score:  1-7 Mild 8-19 Moderate 20-35 Severe   PMH: Past Medical History:  Diagnosis  Date   Alcohol abuse    pt states it is marijuana   Anemia    Arthritis    Bipolar disorder (HCC)    BPH (benign prostatic hypertrophy) with urinary obstruction    CAD (coronary artery disease)    a. 2006 PCI/DES to LAD, EF 60%.   Chronic heart failure with preserved ejection fraction (HFpEF) ([place])    a. 07/2014 Echo: EF 55%; b. 11/2020 Echo: EF 50-55%, mod LVH, GrI DD, mildly enlarged RV w/ nl fxn, mild BAE.   Chronic pancreatitis (HCC)    Cirrhosis ([place])    COPD (chronic obstructive pulmonary disease) ([place])    COVID-19    08/22/20   Dementia ([place])    early dementia   Depression    Dilated ascending aorta and aortic root ([place])    a. 11/2020 Echo: Ao root 70mm, Asc Ao 85mm. Ao arch 20mm.   Diverticulitis    12/06/20 [facility]     Diverticulosis    Drug use    Dyspnea    ED (erectile dysfunction)    Gastritis    GERD (gastroesophageal reflux disease)    Headache    occasional migraines   Hepatitis C    treated   History of lumbar fusion    Hyperlipidemia    Hypertension    patient denies having high blood pressure   Lung cancer ([place])    Pancreatitis    x 2    Pneumonia    07/06/18    Premature atrial contractions    a. 04/2021 [facility]: Freq PACs w/ 8% burden.   Prostate cancer [facility])    with seeds   PSVT (paroxysmal supraventricular tachycardia) ([place])    a. 04/2021 [facility]: Avg HR 80 (60-210). 42 SVT runs - longest 5 beats @ 116, fastest 210 x 5 beats. Freq PACs (8%).   PTSD (post-traumatic stress disorder)    Rib fracture    s/p fall 03/15/19    Sinus disease    Urge incontinence     Surgical History: Past Surgical History:  Procedure Laterality Date   CARDIAC CATHETERIZATION  2006   cateract  2013   COLONOSCOPY WITH PROPOFOL N/A 10/04/2019   Procedure: COLONOSCOPY WITH PROPOFOL;  Surgeon: Lucilla Lame, MD;  Location: [facility] ENDOSCOPY;  Service: Endoscopy;  Laterality: N/A;   CORONARY ANGIOPLASTY  2006   CORONARY STENT PLACEMENT  2006   x 1   CYSTOSCOPY W/ RETROGRADES N/A 08/08/2021   Procedure: CYSTOSCOPY WITH RETROGRADE PYELOGRAM;  Surgeon: Hollice Espy, MD;  Location: [facility] ORS;  Service: Urology;  Laterality: N/A;   CYSTOSCOPY WITH URETHRAL DILATATION N/A 08/08/2021   Procedure: CYSTOSCOPY WITH URETHRAL DILATATION WITH UROLUME BALLOON;  Surgeon: Hollice Espy, MD;  Location: [facility] ORS;  Service: Urology;  Laterality: N/A;   ESOPHAGOGASTRODUODENOSCOPY N/A 12/22/2014   Procedure: ESOPHAGOGASTRODUODENOSCOPY (EGD);  Surgeon: [name], MD;  Location: [facility] ENDOSCOPY;  Service: Endoscopy;  Laterality: N/A;   ESOPHAGOGASTRODUODENOSCOPY (EGD) WITH PROPOFOL N/A 10/04/2019   Procedure: ESOPHAGOGASTRODUODENOSCOPY (EGD) WITH PROPOFOL;  Surgeon: Lucilla Lame, MD;  Location: [facility] ENDOSCOPY;  Service:  Endoscopy;  Laterality: N/A;   HERNIA REPAIR  2015   left groin   INTERCOSTAL NERVE BLOCK Left 11/09/2019   Procedure: Intercostal Nerve Block;  Surgeon: Melrose Nakayama, MD;  Location: [facility];  Service: Thoracic;  Laterality: Left;   LUMBAR FUSION     RADIOACTIVE SEED IMPLANT N/A 04/22/2016   Procedure: RADIOACTIVE SEED IMPLANT/BRACHYTHERAPY IMPLANT;  Surgeon: Hollice Espy, MD;  Location: [facility] ORS;  Service: Urology;  Laterality: N/A;   ROBOT ASSISTED INGUINAL HERNIA REPAIR Bilateral 07/06/2018   Procedure: ROBOT ASSISTED INGUINAL HERNIA REPAIR;  Surgeon: Jules Husbands, MD;  Location: [facility] ORS;  Service: General;  Laterality: Bilateral;   TONSILLECTOMY     UMBILICAL HERNIA REPAIR N/A 07/06/2018   Procedure: LAPAROSCOPIC ROBOT ASSISTED UMBILICAL HERNIA;  Surgeon: Jules Husbands, MD;  Location: [facility] ORS;  Service: General;  Laterality: N/A;   XI ROBOTIC ASSISTED THORACOSCOPY- SEGMENTECTOMY Left 11/09/2019   Procedure: XI ROBOTIC ASSISTED THORACOSCOPY-WEDGE RESECTION LEFT LOWER LOBE;  Surgeon: Melrose Nakayama, MD;  Location: [place] [facility];  Service: Thoracic;  Laterality: Left;    Home Medications:  Allergies as of 04/01/2022   No Known Allergies      Medication List        Accurate as of April 01, 2022  1:59 PM. If you have any questions, ask your nurse or doctor.          albuterol 108 (90 Base) MCG/ACT inhaler Commonly known as: VENTOLIN HFA Inhale 1-2 puffs into the lungs every 6 (six) hours as needed for wheezing or shortness of breath.   albuterol (2.5 MG/3ML) 0.083% nebulizer solution Commonly known as: PROVENTIL Take 3 mLs (2.5 mg total) by nebulization every 6 (six) hours as needed for wheezing or shortness of breath.   ARIPiprazole 10 MG tablet Commonly known as: ABILIFY 1 TAB BY MOUTH EVERY DAY AT BEDTIME   aspirin EC 81 MG tablet Take 81 mg by mouth daily.   Breztri Aerosphere 160-9-4.8 MCG/ACT Aero Generic drug: Budeson-Glycopyrrol-Formoterol Inhale 2  puffs into the lungs in the morning and at bedtime.   celecoxib 200 MG capsule Commonly known as: CELEBREX TAKE 1 CAPSULE BY MOUTH DAILY AFTER BREAKFAST   clopidogrel 75 MG tablet Commonly known as: PLAVIX Take 1 tablet (75 mg total) by mouth daily.   Dextromethorphan-Guaifenesin 60-1200 MG 12hr tablet Take 1 tablet by mouth every 12 (twelve) hours. Prn cough   donepezil 5 MG tablet Commonly known as: ARICEPT TAKE 1 TABLET BY MOUTH AT BEDTIME   FLUoxetine 40 MG capsule Commonly known as: PROZAC 1 CAPSULE BY MOUTH EVERY MORNING   fluticasone 50 MCG/ACT nasal spray Commonly known as: FLONASE Place 2 sprays into both nostrils daily. Prn after nasal saline   furosemide 20 MG tablet Commonly known as: LASIX Take 0.5 tablets (10 mg total) by mouth daily as needed. In am   Gemtesa 75 MG Tabs Generic drug: Vibegron Take 75 mg by mouth daily.   memantine 5 MG tablet Commonly known as: NAMENDA TAKE 1 TABLET (5 MG TOTAL) BY MOUTH 2 (TWO) TIMES DAILY.   methylPREDNISolone 4 MG Tbpk tablet Commonly known as: MEDROL DOSEPAK 6 day dose pack - take as directed   metoprolol succinate 50 MG 24 hr tablet Commonly known as: TOPROL-XL TAKE 1 TABLET DAILY   multivitamin with minerals tablet Take 1 tablet by mouth daily.   Myrbetriq 50 MG Tb24 tablet Generic drug: mirabegron ER TAKE 1 TABLET BY MOUTH DAILY   ondansetron 4 MG disintegrating tablet Commonly known as: ZOFRAN-ODT Take 1 tablet (4 mg total) by mouth every 8 (eight) hours as needed for nausea or vomiting.   pantoprazole 40 MG tablet Commonly known as: PROTONIX TAKE 1 TABLET (40 MG TOTAL) BY MOUTH DAILY. 30 MINUTE(S) BEFORE BREAKFAST   rosuvastatin 10 MG tablet Commonly known as: CRESTOR Take 1 tablet (10 mg total) by mouth at bedtime.   sodium chloride 0.65 % Soln nasal spray Commonly  known as: OCEAN Place 2 sprays into both nostrils as needed for congestion.   spironolactone 25 MG tablet Commonly known as:  ALDACTONE Take 0.5 tablets (12.5 mg total) by mouth daily as needed. In am   sucralfate 1 g tablet Commonly known as: CARAFATE TAKE 1 TABLET BY MOUTH FOUR TIMES A DAY   tamsulosin 0.4 MG Caps capsule Commonly known as: FLOMAX TAKE 1 CAPSULE BY MOUTH EVERY EVENING AFTER SUPPER   traZODone 100 MG tablet Commonly known as: DESYREL Take 1 tablet (100 mg total) by mouth at bedtime as needed for sleep.        Allergies: No Known Allergies  Family History: Family History  Problem Relation Age of Onset   Heart disease Father        Unclear details. Father passed in late 71's 2/2 cancer.   Colon cancer Father    Alcohol abuse Sister    Drug abuse Sister    Anxiety disorder Sister    Depression Sister    COPD Brother    Obesity Brother    Kidney disease Neg Hx    Prostate cancer Neg Hx     Social History:  reports that he has been smoking cigarettes. He started smoking about 59 years ago. He has a 82.50 pack-year smoking history. He has never used smokeless tobacco. He reports current alcohol use of about 1.0 standard drink of alcohol per week. He reports current drug use. Frequency: 7.00 times per week. Drug: Marijuana.   Physical Exam: BP 126/77   Pulse 79   Ht 6' (1.829 m)   Wt 268 lb (121.6 kg)   BMI 36.35 kg/m   Constitutional:  Alert and oriented, No acute distress. HEENT: [place] AT, moist mucus membranes.  Trachea midline, no masses. Cardiovascular: No clubbing, cyanosis, or edema. Respiratory: Normal respiratory effort, no increased work of breathing. GI: Abdomen is soft, nontender, nondistended, no abdominal masses GU: No CVA tenderness Skin: No rashes, bruises or suspicious lesions. Neurologic: Grossly intact, no focal deficits, moving all 4 extremities. Psychiatric: Normal mood and affect.  Laboratory Data: Lab Results  Component Value Date   WBC 8.8 12/24/2021   HGB 12.5 (L) 12/24/2021   HCT 36.6 (L) 12/24/2021   MCV 97.3 12/24/2021   PLT 290 12/24/2021     Lab Results  Component Value Date   CREATININE 1.32 (H) 12/24/2021    Lab Results  Component Value Date   PSA 1.74 10/02/2016    Lab Results  Component Value Date   TESTOSTERONE 134 (L) 07/02/2020    Lab Results  Component Value Date   HGBA1C 5.8 07/18/2021    Assessment & Plan:    1. Prostate cancer ([place]) No evidence of disease, PSA is undetectable - Bladder Scan (Post Void Residual) in office - PSA; Future  2. Other stricture of bulbous urethra in male Status post dilation, caliber stream is improved and he is emptying well today - Bladder Scan (Post Void Residual) in office - PSA; Future  3. Urge incontinence Continues on Myrbetriq and Flomax with refractory irritative urinary symptoms  We discussed second line therapies today including PTNS and Botox.  He has not shown either of these at this point in time.  He will let us know if he changes mind.   Return in about 1 year (around 04/02/2023) for IPSS/PSA/PVR.  And urinalysis  Hollice Espy, MD  [facility] [address], [place], [place] [address] [phone]  I spent 32 total minutes  on the day of the encounter including pre-visit review of the medical record, face-to-face time with the patient, and post visit ordering of labs/imaging/tests.

## 2022-04-02 ENCOUNTER — Other Ambulatory Visit: Payer: Self-pay | Admitting: Internal Medicine

## 2022-04-02 DIAGNOSIS — M159 Polyosteoarthritis, unspecified: Secondary | ICD-10-CM

## 2022-04-02 DIAGNOSIS — R4189 Other symptoms and signs involving cognitive functions and awareness: Secondary | ICD-10-CM

## 2022-04-03 ENCOUNTER — Ambulatory Visit: Payer: Medicare HMO | Attending: Cardiovascular Disease | Admitting: Cardiovascular Disease

## 2022-04-03 ENCOUNTER — Encounter: Payer: Self-pay | Admitting: Cardiovascular Disease

## 2022-04-03 VITALS — BP 100/64 | HR 76 | Ht 72.0 in | Wt 262.2 lb

## 2022-04-03 DIAGNOSIS — G301 Alzheimer's disease with late onset: Secondary | ICD-10-CM

## 2022-04-03 DIAGNOSIS — Z720 Tobacco use: Secondary | ICD-10-CM | POA: Diagnosis not present

## 2022-04-03 DIAGNOSIS — I2510 Atherosclerotic heart disease of native coronary artery without angina pectoris: Secondary | ICD-10-CM | POA: Diagnosis not present

## 2022-04-03 DIAGNOSIS — J449 Chronic obstructive pulmonary disease, unspecified: Secondary | ICD-10-CM | POA: Diagnosis not present

## 2022-04-03 DIAGNOSIS — E785 Hyperlipidemia, unspecified: Secondary | ICD-10-CM | POA: Diagnosis not present

## 2022-04-03 DIAGNOSIS — F02A0 Dementia in other diseases classified elsewhere, mild, without behavioral disturbance, psychotic disturbance, mood disturbance, and anxiety: Secondary | ICD-10-CM | POA: Diagnosis not present

## 2022-04-03 NOTE — Patient Instructions (Signed)
Medication Instructions:  Your physician recommends that you continue on your current medications as directed. Please refer to the Current Medication list given to you today.  *If you need a refill on your cardiac medications before your next appointment, please call your pharmacy*   Lab Work: None ordered  If you have labs (blood work) drawn today and your tests are completely normal, you will receive your results only by: MyChart Message (if you have MyChart) OR A paper copy in the mail If you have any lab test that is abnormal or we need to change your treatment, we will call you to review the results.   Testing/Procedures: None ordered   Follow-Up: At [facility], you and your health needs are our priority.  As part of our continuing mission to provide you with exceptional heart care, we have created designated Provider Care Teams.  These Care Teams include your primary Cardiologist (physician) and Advanced Practice Providers (APPs -  Physician Assistants and Nurse Practitioners) who all work together to provide you with the care you need, when you need it.  We recommend signing up for the patient portal called "MyChart".  Sign up information is provided on this After Visit Summary.  MyChart is used to connect with patients for Virtual Visits (Telemedicine).  Patients are able to view lab/test results, encounter notes, upcoming appointments, etc.  Non-urgent messages can be sent to your provider as well.   To learn more about what you can do with MyChart, go to [email or web address].    Your next appointment:   6 month(s)  The format for your next appointment:   In Person  Provider:   You may see Muhammad Arida, MD or one of the following Advanced Practice Providers on your designated Care Team:   Christopher Berge, NP Ryan Dunn, PA-C Cadence Furth, PA-C Sheri Hammock, NP   Other Instructions N/A  Important Information About Sugar       

## 2022-04-03 NOTE — Progress Notes (Signed)
Cardiology Office Note   Date:  04/03/2022   ID:  Christopher Orr, DOB 1946/04/03, MRN [id number]  PCP:  McLean-Scocuzza, [name], MD  Cardiologist:   Kathlyn Sacramento, MD   Chief Complaint  Patient presents with   Other    6 Month f/u c/o minor chest pain and sob. Meds reviewed verbally with pt.      History of Present Illness: Christopher Orr is a 76 y.o. male who is here today for follow-up visit regarding coronary artery disease.  He has known history of coronary artery disease status post LAD stenting 2006 with Cypher drug-eluting stent. He has multiple medical problems including prolonged history of tobacco use, COPD, hyperlipidemia, obesity, liver cirrhosis, excessive alcohol use and hepatitis C which was treated.  He also has mild dementia. No cardiac events since his stent placement in 2006.  He had a repeat cardiac catheterization in 2011 which showed patent stent with mild restenosis. He continues to smoke 1 pack/day.  He had a CT scan of the abdomen in 2019 that showed no evidence of aortic aneurysm.  He was diagnosed with squamous cell cancer of the lung in April of 2021 and underwent robotic assisted wedge resection of the left lower lobe.  He did not require radiation or chemotherapy.    He had a syncopal episode in June 2022.  Work-up in the ED was unremarkable syncope was in the setting of alcohol use, poor oral intake as well as Norco and benzodiazepine use.  Furosemide was subsequently discontinued.  Echocardiogram showed an EF of 50 to 55% with no significant valvular abnormalities. Outpatient monitor showed frequent PACs.  Unfortunately, he continues to smoke and drinks whiskey on a daily basis.  His son died last year of drug overdose. He continues to have chronic shortness of breath and intermittent episodes of sharp chest discomfort which has not changed since most recent visit.  Past Medical History:  Diagnosis Date   Alcohol abuse    pt states it is  marijuana   Anemia    Arthritis    Bipolar disorder (HCC)    BPH (benign prostatic hypertrophy) with urinary obstruction    CAD (coronary artery disease)    a. 2006 PCI/DES to LAD, EF 60%.   Chronic heart failure with preserved ejection fraction (HFpEF) ([place])    a. 07/2014 Echo: EF 55%; b. 11/2020 Echo: EF 50-55%, mod LVH, GrI DD, mildly enlarged RV w/ nl fxn, mild BAE.   Chronic pancreatitis (HCC)    Cirrhosis ([place])    COPD (chronic obstructive pulmonary disease) ([place])    COVID-19    08/22/20   Dementia ([place])    early dementia   Depression    Dilated ascending aorta and aortic root ([place])    a. 11/2020 Echo: Ao root 95mm, Asc Ao 61mm. Ao arch 83mm.   Diverticulitis    12/06/20 [facility]    Diverticulosis    Drug use    Dyspnea    ED (erectile dysfunction)    Gastritis    GERD (gastroesophageal reflux disease)    Headache    occasional migraines   Hepatitis C    treated   History of lumbar fusion    Hyperlipidemia    Hypertension    patient denies having high blood pressure   Lung cancer ([place])    Pancreatitis    x 2    Pneumonia    07/06/18    Premature atrial contractions    a. 04/2021  Zio: Freq PACs w/ 8% burden.   Prostate cancer [facility])    with seeds   PSVT (paroxysmal supraventricular tachycardia) ([place])    a. 04/2021 Zio: Avg HR 80 (60-210). 42 SVT runs - longest 5 beats @ 116, fastest 210 x 5 beats. Freq PACs (8%).   PTSD (post-traumatic stress disorder)    Rib fracture    s/p fall 03/15/19    Sinus disease    Urge incontinence     Past Surgical History:  Procedure Laterality Date   CARDIAC CATHETERIZATION  2006   cateract  2013   COLONOSCOPY WITH PROPOFOL N/A 10/04/2019   Procedure: COLONOSCOPY WITH PROPOFOL;  Surgeon: Lucilla Lame, MD;  Location: [facility] ENDOSCOPY;  Service: Endoscopy;  Laterality: N/A;   CORONARY ANGIOPLASTY  2006   CORONARY STENT PLACEMENT  2006   x 1   CYSTOSCOPY W/ RETROGRADES N/A 08/08/2021   Procedure: CYSTOSCOPY WITH RETROGRADE PYELOGRAM;   Surgeon: Hollice Espy, MD;  Location: [facility] ORS;  Service: Urology;  Laterality: N/A;   CYSTOSCOPY WITH URETHRAL DILATATION N/A 08/08/2021   Procedure: CYSTOSCOPY WITH URETHRAL DILATATION WITH UROLUME BALLOON;  Surgeon: Hollice Espy, MD;  Location: [facility] ORS;  Service: Urology;  Laterality: N/A;   ESOPHAGOGASTRODUODENOSCOPY N/A 12/22/2014   Procedure: ESOPHAGOGASTRODUODENOSCOPY (EGD);  Surgeon: [name], MD;  Location: [facility] ENDOSCOPY;  Service: Endoscopy;  Laterality: N/A;   ESOPHAGOGASTRODUODENOSCOPY (EGD) WITH PROPOFOL N/A 10/04/2019   Procedure: ESOPHAGOGASTRODUODENOSCOPY (EGD) WITH PROPOFOL;  Surgeon: Lucilla Lame, MD;  Location: [facility] ENDOSCOPY;  Service: Endoscopy;  Laterality: N/A;   HERNIA REPAIR  2015   left groin   INTERCOSTAL NERVE BLOCK Left 11/09/2019   Procedure: Intercostal Nerve Block;  Surgeon: Melrose Nakayama, MD;  Location: [place];  Service: Thoracic;  Laterality: Left;   LUMBAR FUSION     RADIOACTIVE SEED IMPLANT N/A 04/22/2016   Procedure: RADIOACTIVE SEED IMPLANT/BRACHYTHERAPY IMPLANT;  Surgeon: Hollice Espy, MD;  Location: [facility] ORS;  Service: Urology;  Laterality: N/A;   ROBOT ASSISTED INGUINAL HERNIA REPAIR Bilateral 07/06/2018   Procedure: ROBOT ASSISTED INGUINAL HERNIA REPAIR;  Surgeon: Jules Husbands, MD;  Location: [facility] ORS;  Service: General;  Laterality: Bilateral;   TONSILLECTOMY     UMBILICAL HERNIA REPAIR N/A 07/06/2018   Procedure: LAPAROSCOPIC ROBOT ASSISTED UMBILICAL HERNIA;  Surgeon: Jules Husbands, MD;  Location: [facility] ORS;  Service: General;  Laterality: N/A;   XI ROBOTIC ASSISTED THORACOSCOPY- SEGMENTECTOMY Left 11/09/2019   Procedure: XI ROBOTIC ASSISTED THORACOSCOPY-WEDGE RESECTION LEFT LOWER LOBE;  Surgeon: Melrose Nakayama, MD;  Location: [facility] OR;  Service: Thoracic;  Laterality: Left;     Current Outpatient Medications  Medication Sig Dispense Refill   albuterol (PROVENTIL) (2.5 MG/3ML) 0.083% nebulizer solution Take 3 mLs (2.5 mg  total) by nebulization every 6 (six) hours as needed for wheezing or shortness of breath. 375 mL 3   albuterol (VENTOLIN HFA) 108 (90 Base) MCG/ACT inhaler Inhale 1-2 puffs into the lungs every 6 (six) hours as needed for wheezing or shortness of breath. 18 g 12   ARIPiprazole (ABILIFY) 10 MG tablet 1 TAB BY MOUTH EVERY DAY AT BEDTIME 28 tablet 2   aspirin EC 81 MG tablet Take 81 mg by mouth daily.     Budeson-Glycopyrrol-Formoterol (BREZTRI AEROSPHERE) 160-9-4.8 MCG/ACT AERO Inhale 2 puffs into the lungs in the morning and at bedtime. 10.7 g 11   celecoxib (CELEBREX) 200 MG capsule TAKE 1 CAPSULE BY MOUTH DAILY AFTER BREAKFAST 28 capsule 0   clopidogrel (PLAVIX) 75 MG tablet Take  1 tablet (75 mg total) by mouth daily. 90 tablet 3   Dextromethorphan-Guaifenesin 60-1200 MG 12hr tablet Take 1 tablet by mouth every 12 (twelve) hours. Prn cough 30 tablet 0   donepezil (ARICEPT) 5 MG tablet TAKE 1 TABLET BY MOUTH EVERY NIGHT AT BEDTIME 28 tablet 0   FLUoxetine (PROZAC) 40 MG capsule 1 CAPSULE BY MOUTH EVERY MORNING 28 capsule 2   fluticasone (FLONASE) 50 MCG/ACT nasal spray Place 2 sprays into both nostrils daily. Prn after nasal saline 16 g 11   furosemide (LASIX) 20 MG tablet Take 0.5 tablets (10 mg total) by mouth daily as needed. In am 30 tablet 3   memantine (NAMENDA) 5 MG tablet TAKE 1 TABLET (5 MG TOTAL) BY MOUTH 2 (TWO) TIMES DAILY. 56 tablet 2   methylPREDNISolone (MEDROL DOSEPAK) 4 MG TBPK tablet 6 day dose pack - take as directed 21 tablet 0   metoprolol succinate (TOPROL-XL) 50 MG 24 hr tablet TAKE 1 TABLET DAILY 90 tablet 3   Multiple Vitamins-Minerals (MULTIVITAMIN WITH MINERALS) tablet Take 1 tablet by mouth daily.     MYRBETRIQ 50 MG TB24 tablet TAKE 1 TABLET BY MOUTH DAILY 28 tablet 2   ondansetron (ZOFRAN-ODT) 4 MG disintegrating tablet Take 1 tablet (4 mg total) by mouth every 8 (eight) hours as needed for nausea or vomiting. 15 tablet 0   pantoprazole (PROTONIX) 40 MG tablet TAKE 1  TABLET (40 MG TOTAL) BY MOUTH DAILY. 30 MINUTE(S) BEFORE BREAKFAST 28 tablet 2   rosuvastatin (CRESTOR) 10 MG tablet Take 1 tablet (10 mg total) by mouth at bedtime. 90 tablet 3   sodium chloride (OCEAN) 0.65 % SOLN nasal spray Place 2 sprays into both nostrils as needed for congestion. 30 mL 11   spironolactone (ALDACTONE) 25 MG tablet Take 0.5 tablets (12.5 mg total) by mouth daily as needed. In am 45 tablet 3   sucralfate (CARAFATE) 1 g tablet TAKE 1 TABLET BY MOUTH FOUR TIMES A DAY 120 tablet 11   tamsulosin (FLOMAX) 0.4 MG CAPS capsule TAKE 1 CAPSULE BY MOUTH EVERY EVENING AFTER SUPPER 28 capsule 2   traZODone (DESYREL) 100 MG tablet Take 1 tablet (100 mg total) by mouth at bedtime as needed for sleep. 90 tablet 3   Vibegron (GEMTESA) 75 MG TABS Take 75 mg by mouth daily. 30 tablet 0   No current facility-administered medications for this visit.    Allergies:   Patient has no known allergies.    Social History:  The patient  reports that he has been smoking cigarettes. He started smoking about 59 years ago. He has a 82.50 pack-year smoking history. He has never used smokeless tobacco. He reports current alcohol use of about 1.0 standard drink of alcohol per week. He reports current drug use. Frequency: 7.00 times per week. Drug: Marijuana.   Family History:  The patient's family history includes Alcohol abuse in his sister; Anxiety disorder in his sister; COPD in his brother; Colon cancer in his father; Depression in his sister; Drug abuse in his sister; Heart disease in his father; Obesity in his brother.    ROS:  Please see the history of present illness.   Otherwise, review of systems are positive for none.   All other systems are reviewed and negative.    PHYSICAL EXAM: VS:  BP 100/64 (BP Location: Left Arm, Patient Position: Sitting, Cuff Size: Normal)   Pulse 76   Ht 6' (1.829 m)   Wt 262 lb 4 oz (119 kg)  SpO2 91%   BMI 35.57 kg/m  , BMI Body mass index is 35.57  kg/m. GEN: Well nourished, well developed, in no acute distress  HEENT: normal  Neck: no JVD, carotid bruits, or masses Cardiac: RRR with premature beats; no murmurs, rubs, or gallops,no edema.   Respiratory:  clear to auscultation bilaterally with diminished breath sounds bilaterally, normal work of breathing GI: soft, nontender, nondistended, + BS MS: no deformity or atrophy  Skin: warm and dry, no rash Neuro:  Strength and sensation are intact Psych: euthymic mood, full affect   EKG:  EKG is ordered today. The ekg ordered today demonstrates normal sinus rhythm with left anterior fascicular block.   Recent Labs: 07/18/2021: TSH 0.51 12/24/2021: ALT 17; BUN 17; Creatinine, Ser 1.32; Hemoglobin 12.5; Platelets 290; Potassium 4.1; Sodium 139    Lipid Panel    Component Value Date/Time   CHOL 98 07/18/2021 1043   TRIG 146.0 07/18/2021 1043   HDL 43.80 07/18/2021 1043   CHOLHDL 2 07/18/2021 1043   VLDL 29.2 07/18/2021 1043   LDLCALC 25 07/18/2021 1043      Wt Readings from Last 3 Encounters:  04/03/22 262 lb 4 oz (119 kg)  04/01/22 268 lb (121.6 kg)  01/30/22 264 lb 9.6 oz (120 kg)          03/24/2019    8:29 AM  PAD Screen  Previous PAD dx? No  Previous surgical procedure? No  Pain with walking? No  Feet/toe relief with dangling? No  Painful, non-healing ulcers? No  Extremities discolored? No      ASSESSMENT AND PLAN:  1.  Coronary artery disease involving native coronary arteries without angina: He is doing well overall with no anginal symptoms.  Continue medical therapy.  Given that he has a first generation drug-eluting stent, continue dual antiplatelet therapy indefinitely as tolerated.    2.  Hyperlipidemia: Continue treatment with rosuvastatin.  I reviewed most recent lipid profile done in December which showed an LDL of 25.  3.  Tobacco use: I again discussed with him the importance of smoking cessation but he reports inability to quit.  4.  COPD:  On inhalers.  5.  Mild dementia: Currently on Aricept and Namenda.  6.  Excessive alcohol use: I discussed with him the importance of cutting down or complete abstinence.    Disposition:   FU with me in 6 months  Signed,  Kathlyn Sacramento, MD  04/03/2022 9:27 AM    [place]

## 2022-04-07 NOTE — Addendum Note (Signed)
Addended by: Britt Bottom on: 04/07/2022 01:02 PM   Modules accepted: Orders

## 2022-04-08 ENCOUNTER — Encounter: Payer: Self-pay | Admitting: Internal Medicine

## 2022-04-08 ENCOUNTER — Ambulatory Visit (INDEPENDENT_AMBULATORY_CARE_PROVIDER_SITE_OTHER): Payer: Medicare HMO | Admitting: Internal Medicine

## 2022-04-08 ENCOUNTER — Other Ambulatory Visit: Payer: Self-pay | Admitting: Internal Medicine

## 2022-04-08 VITALS — BP 100/60 | HR 86 | Temp 98.0°F | Ht 72.0 in | Wt 264.4 lb

## 2022-04-08 DIAGNOSIS — N179 Acute kidney failure, unspecified: Secondary | ICD-10-CM

## 2022-04-08 DIAGNOSIS — R600 Localized edema: Secondary | ICD-10-CM | POA: Diagnosis not present

## 2022-04-08 DIAGNOSIS — K219 Gastro-esophageal reflux disease without esophagitis: Secondary | ICD-10-CM | POA: Diagnosis not present

## 2022-04-08 DIAGNOSIS — R4189 Other symptoms and signs involving cognitive functions and awareness: Secondary | ICD-10-CM

## 2022-04-08 DIAGNOSIS — R7303 Prediabetes: Secondary | ICD-10-CM | POA: Diagnosis not present

## 2022-04-08 DIAGNOSIS — K7030 Alcoholic cirrhosis of liver without ascites: Secondary | ICD-10-CM | POA: Diagnosis not present

## 2022-04-08 DIAGNOSIS — L821 Other seborrheic keratosis: Secondary | ICD-10-CM

## 2022-04-08 DIAGNOSIS — J449 Chronic obstructive pulmonary disease, unspecified: Secondary | ICD-10-CM

## 2022-04-08 DIAGNOSIS — J441 Chronic obstructive pulmonary disease with (acute) exacerbation: Secondary | ICD-10-CM

## 2022-04-08 DIAGNOSIS — E785 Hyperlipidemia, unspecified: Secondary | ICD-10-CM | POA: Diagnosis not present

## 2022-04-08 DIAGNOSIS — Z23 Encounter for immunization: Secondary | ICD-10-CM | POA: Diagnosis not present

## 2022-04-08 DIAGNOSIS — M159 Polyosteoarthritis, unspecified: Secondary | ICD-10-CM | POA: Diagnosis not present

## 2022-04-08 DIAGNOSIS — J439 Emphysema, unspecified: Secondary | ICD-10-CM

## 2022-04-08 LAB — BASIC METABOLIC PANEL
BUN: 15 mg/dL (ref 6–23)
CO2: 27 mEq/L (ref 19–32)
Calcium: 9.2 mg/dL (ref 8.4–10.5)
Chloride: 104 mEq/L (ref 96–112)
Creatinine, Ser: 1.14 mg/dL (ref 0.40–1.50)
GFR: 62.85 mL/min (ref >60.00)
Glucose, Bld: 95 mg/dL (ref 70–99)
Potassium: 4.1 mEq/L (ref 3.5–5.1)
Sodium: 139 mEq/L (ref 135–145)

## 2022-04-08 LAB — HEMOGLOBIN A1C: Hgb A1c MFr Bld: 6.1 % (ref 4.6–6.5)

## 2022-04-08 MED ORDER — FLUOXETINE HCL 40 MG PO CAPS: ORAL_CAPSULE | ORAL | 3 refills | Status: DC

## 2022-04-08 MED ORDER — CELECOXIB 200 MG PO CAPS: ORAL_CAPSULE | ORAL | 1 refills | Status: DC

## 2022-04-08 MED ORDER — FUROSEMIDE 20 MG PO TABS: 10.0000 mg | ORAL_TABLET | Freq: Every day | ORAL | 3 refills | Status: DC | PRN

## 2022-04-08 MED ORDER — MEMANTINE HCL 5 MG PO TABS: 5.0000 mg | ORAL_TABLET | Freq: Two times a day (BID) | ORAL | 3 refills | Status: DC

## 2022-04-08 MED ORDER — DM-GUAIFENESIN ER 60-1200 MG PO TB12: 1.0000 | ORAL_TABLET | Freq: Two times a day (BID) | ORAL | 0 refills | Status: DC

## 2022-04-08 MED ORDER — ALBUTEROL SULFATE HFA 108 (90 BASE) MCG/ACT IN AERS: 1.0000 | INHALATION_SPRAY | Freq: Four times a day (QID) | RESPIRATORY_TRACT | 12 refills | Status: DC | PRN

## 2022-04-08 MED ORDER — ALBUTEROL SULFATE (2.5 MG/3ML) 0.083% IN NEBU: 2.5000 mg | INHALATION_SOLUTION | Freq: Four times a day (QID) | RESPIRATORY_TRACT | 3 refills | Status: DC | PRN

## 2022-04-08 MED ORDER — ARIPIPRAZOLE 10 MG PO TABS: ORAL_TABLET | ORAL | 3 refills | Status: DC

## 2022-04-08 MED ORDER — PANTOPRAZOLE SODIUM 40 MG PO TBEC: DELAYED_RELEASE_TABLET | ORAL | 3 refills | Status: DC

## 2022-04-08 MED ORDER — TRAZODONE HCL 100 MG PO TABS: 100.0000 mg | ORAL_TABLET | Freq: Every evening | ORAL | 3 refills | Status: DC | PRN

## 2022-04-08 MED ORDER — FLUTICASONE PROPIONATE 50 MCG/ACT NA SUSP: 2.0000 | Freq: Every day | NASAL | 11 refills | Status: DC

## 2022-04-08 MED ORDER — DONEPEZIL HCL 5 MG PO TABS: 5.0000 mg | ORAL_TABLET | Freq: Every day | ORAL | 3 refills | Status: DC

## 2022-04-08 MED ORDER — METOPROLOL SUCCINATE ER 50 MG PO TB24: 50.0000 mg | ORAL_TABLET | Freq: Every day | ORAL | 3 refills | Status: DC

## 2022-04-08 MED ORDER — ROSUVASTATIN CALCIUM 10 MG PO TABS: 10.0000 mg | ORAL_TABLET | Freq: Every day | ORAL | 3 refills | Status: DC

## 2022-04-08 MED ORDER — LEVOFLOXACIN 750 MG PO TABS: 750.0000 mg | ORAL_TABLET | Freq: Every day | ORAL | 0 refills | Status: DC

## 2022-04-08 NOTE — Patient Instructions (Addendum)
Dr. Volanda Napoleon new PCP   Stop Augmentin change to levaquin x 7 days with food  Pick up mucinex DM  You should not be taking Myrbetriq and Gemtasa which one do you have please let me know ?     [facility] [employer] [facility] in [place], [facility] [employer] is an [employer] trade [employer] park located in [place] and [place], [place], [place]. Founded in [place] opened its doors to visitors in 2007 and attracts approximately 16,000 visitors per annum. Wikipedia Address: [address], [place], [place] [address] Hours:  Closed ? Opens 9?AM Sat Updated by this business 12 weeks ago Phone: [phone]

## 2022-04-08 NOTE — Progress Notes (Signed)
Chief Complaint  Patient presents with   Follow-up    3 month f/u and pt has a spot on his side he would like looked at   3 month f/u  1. Spot on side its sk reviewed with pt  2. Coughing with white phelgm fast med appt 03/31/22 given tessalon perles, zpack, augmentin and prednisone but still coughing  Still smoking rec cessation denies sob  H/o lung cancer and resection  Ct chest had 11/2021     Review of Systems  Constitutional:  Negative for weight loss.  HENT:  Negative for hearing loss.   Eyes:  Negative for blurred vision.  Respiratory:  Negative for shortness of breath.   Cardiovascular:  Negative for chest pain.  Gastrointestinal:  Negative for abdominal pain and blood in stool.  Musculoskeletal:  Negative for back pain.  Skin:  Negative for rash.  Neurological:  Negative for headaches.  Psychiatric/Behavioral:  Negative for depression.    Past Medical History:  Diagnosis Date   Alcohol abuse    pt states it is marijuana   Anemia    Arthritis    Bipolar disorder (HCC)    BPH (benign prostatic hypertrophy) with urinary obstruction    CAD (coronary artery disease)    a. 2006 PCI/DES to LAD, EF 60%.   Chronic heart failure with preserved ejection fraction (HFpEF) ([place])    a. 07/2014 Echo: EF 55%; b. 11/2020 Echo: EF 50-55%, mod LVH, GrI DD, mildly enlarged RV w/ nl fxn, mild BAE.   Chronic pancreatitis (HCC)    Cirrhosis ([place])    COPD (chronic obstructive pulmonary disease) ([place])    COVID-19    08/22/20   Dementia ([place])    early dementia   Depression    Dilated ascending aorta and aortic root ([place])    a. 11/2020 Echo: Ao root 24mm, Asc Ao 42mm. Ao arch 28mm.   Diverticulitis    12/06/20 [facility]    Diverticulosis    Drug use    Dyspnea    ED (erectile dysfunction)    Gastritis    GERD (gastroesophageal reflux disease)    Headache    occasional migraines   Hepatitis C    treated   History of lumbar fusion    Hyperlipidemia    Hypertension    patient denies  having high blood pressure   Lung cancer ([place])    Pancreatitis    x 2    Pneumonia    07/06/18    Premature atrial contractions    a. 04/2021 [facility]: Freq PACs w/ 8% burden.   Prostate cancer [facility])    with seeds   PSVT (paroxysmal supraventricular tachycardia) ([place])    a. 04/2021 [facility]io: Avg HR 80 (60-210). 42 SVT runs - longest 5 beats @ 116, fastest 210 x 5 beats. Freq PACs (8%).   PTSD (post-traumatic stress disorder)    Rib fracture    s/p fall 03/15/19    Sinus disease    Urge incontinence    Past Surgical History:  Procedure Laterality Date   CARDIAC CATHETERIZATION  2006   cateract  2013   COLONOSCOPY WITH PROPOFOL N/A 10/04/2019   Procedure: COLONOSCOPY WITH PROPOFOL;  Surgeon: Lucilla Lame, MD;  Location: [facility] ENDOSCOPY;  Service: Endoscopy;  Laterality: N/A;   CORONARY ANGIOPLASTY  2006   CORONARY STENT PLACEMENT  2006   x 1   CYSTOSCOPY W/ RETROGRADES N/A 08/08/2021   Procedure: CYSTOSCOPY WITH RETROGRADE PYELOGRAM;  Surgeon: Hollice Espy, MD;  Location:  [facility] ORS;  Service: Urology;  Laterality: N/A;   CYSTOSCOPY WITH URETHRAL DILATATION N/A 08/08/2021   Procedure: CYSTOSCOPY WITH URETHRAL DILATATION WITH UROLUME BALLOON;  Surgeon: Hollice Espy, MD;  Location: [facility] ORS;  Service: Urology;  Laterality: N/A;   ESOPHAGOGASTRODUODENOSCOPY N/A 12/22/2014   Procedure: ESOPHAGOGASTRODUODENOSCOPY (EGD);  Surgeon: [name], MD;  Location: [facility] ENDOSCOPY;  Service: Endoscopy;  Laterality: N/A;   ESOPHAGOGASTRODUODENOSCOPY (EGD) WITH PROPOFOL N/A 10/04/2019   Procedure: ESOPHAGOGASTRODUODENOSCOPY (EGD) WITH PROPOFOL;  Surgeon: Lucilla Lame, MD;  Location: [facility] ENDOSCOPY;  Service: Endoscopy;  Laterality: N/A;   HERNIA REPAIR  2015   left groin   INTERCOSTAL NERVE BLOCK Left 11/09/2019   Procedure: Intercostal Nerve Block;  Surgeon: Melrose Nakayama, MD;  Location: [place];  Service: Thoracic;  Laterality: Left;   LUMBAR FUSION     RADIOACTIVE SEED IMPLANT N/A 04/22/2016    Procedure: RADIOACTIVE SEED IMPLANT/BRACHYTHERAPY IMPLANT;  Surgeon: Hollice Espy, MD;  Location: [facility] ORS;  Service: Urology;  Laterality: N/A;   ROBOT ASSISTED INGUINAL HERNIA REPAIR Bilateral 07/06/2018   Procedure: ROBOT ASSISTED INGUINAL HERNIA REPAIR;  Surgeon: Jules Husbands, MD;  Location: [facility] ORS;  Service: General;  Laterality: Bilateral;   TONSILLECTOMY     UMBILICAL HERNIA REPAIR N/A 07/06/2018   Procedure: LAPAROSCOPIC ROBOT ASSISTED UMBILICAL HERNIA;  Surgeon: Jules Husbands, MD;  Location: [facility] ORS;  Service: General;  Laterality: N/A;   XI ROBOTIC ASSISTED THORACOSCOPY- SEGMENTECTOMY Left 11/09/2019   Procedure: XI ROBOTIC ASSISTED THORACOSCOPY-WEDGE RESECTION LEFT LOWER LOBE;  Surgeon: Melrose Nakayama, MD;  Location: [facility] OR;  Service: Thoracic;  Laterality: Left;   Family History  Problem Relation Age of Onset   Heart disease Father        Unclear details. Father passed in late 79's 2/2 cancer.   Colon cancer Father    Alcohol abuse Sister    Drug abuse Sister    Anxiety disorder Sister    Depression Sister    COPD Brother    Obesity Brother    Kidney disease Neg Hx    Prostate cancer Neg Hx    Social History   Socioeconomic History   Marital status: Divorced    Spouse name: Not on file   Number of children: 4   Years of education: Not on file   Highest education level: GED or equivalent  Occupational History   Occupation: Aeronautical engineer    Comment: retired  Tobacco Use   Smoking status: Every Day    Packs/day: 1.50    Years: 55.00    Total pack years: 82.50    Types: Cigarettes    Start date: 07/28/1962   Smokeless tobacco: Never   Tobacco comments:    1PPD 01/30/2022  Vaping Use   Vaping Use: Never used  Substance and Sexual Activity   Alcohol use: Yes    Alcohol/week: 1.0 standard drink of alcohol    Types: 1 Cans of beer per week    Comment: 2-3 beers a day   Drug use: Yes    Frequency: 7.0 times per week    Types: Marijuana     Comment: Endorsed heroin 06/2014, UDS also + benzos, opiates, THC, neg for cocaine at that time.   Sexual activity: Not Currently  Other Topics Concern   Not on file  Social History Narrative   Lives at home    Has kids and grandkids   Smoker x 55 years 1 ppd as of 07/2019 he did quit x 2  years    Drinks 1 pt liq qd as of 07/2019    Social Determinants of Health   Financial Resource Strain: Low Risk  (04/17/2021)   Overall Financial Resource Strain (CARDIA)    Difficulty of Paying Living Expenses: Not hard at all  Food Insecurity: No Food Insecurity (04/17/2021)   Hunger Vital Sign    Worried About Running Out of Food in the Last Year: Never true    Ran Out of Food in the Last Year: Never true  Transportation Needs: No Transportation Needs (04/17/2021)   PRAPARE - Hydrologist (Medical): No    Lack of Transportation (Non-Medical): No  Physical Activity: Inactive (12/25/2017)   Exercise Vital Sign    Days of Exercise per Week: 0 days    Minutes of Exercise per Session: 0 min  Stress: No Stress Concern Present (04/17/2021)   [place]    Feeling of Stress : Not at all  Social Connections: Moderately Isolated (04/17/2021)   Social Connection and Isolation Panel [NHANES]    Frequency of Communication with Friends and Family: More than three times a week    Frequency of Social Gatherings with Friends and Family: Once a week    Attends Religious Services: Never    Marine scientist or Organizations: Yes    Attends Music therapist: More than 4 times per year    Marital Status: Divorced  Intimate Partner Violence: Not At Risk (04/17/2021)   Humiliation, Afraid, Rape, and Kick questionnaire    Fear of Current or Ex-Partner: No    Emotionally Abused: No    Physically Abused: No    Sexually Abused: No   Current Meds  Medication Sig   aspirin EC 81 MG tablet Take 81 mg by mouth  daily.   Budeson-Glycopyrrol-Formoterol (BREZTRI AEROSPHERE) 160-9-4.8 MCG/ACT AERO Inhale 2 puffs into the lungs in the morning and at bedtime.   clopidogrel (PLAVIX) 75 MG tablet Take 1 tablet (75 mg total) by mouth daily.   levofloxacin (LEVAQUIN) 750 MG tablet Take 1 tablet (750 mg total) by mouth daily. With food   Multiple Vitamins-Minerals (MULTIVITAMIN WITH MINERALS) tablet Take 1 tablet by mouth daily.   MYRBETRIQ 50 MG TB24 tablet TAKE 1 TABLET BY MOUTH DAILY   ondansetron (ZOFRAN-ODT) 4 MG disintegrating tablet Take 1 tablet (4 mg total) by mouth every 8 (eight) hours as needed for nausea or vomiting.   sodium chloride (OCEAN) 0.65 % SOLN nasal spray Place 2 sprays into both nostrils as needed for congestion.   spironolactone (ALDACTONE) 25 MG tablet Take 0.5 tablets (12.5 mg total) by mouth daily as needed. In am   sucralfate (CARAFATE) 1 g tablet TAKE 1 TABLET BY MOUTH FOUR TIMES A DAY   tamsulosin (FLOMAX) 0.4 MG CAPS capsule TAKE 1 CAPSULE BY MOUTH EVERY EVENING AFTER SUPPER   Vibegron (GEMTESA) 75 MG TABS Take 75 mg by mouth daily.   [DISCONTINUED] albuterol (PROVENTIL) (2.5 MG/3ML) 0.083% nebulizer solution Take 3 mLs (2.5 mg total) by nebulization every 6 (six) hours as needed for wheezing or shortness of breath.   [DISCONTINUED] albuterol (VENTOLIN HFA) 108 (90 Base) MCG/ACT inhaler Inhale 1-2 puffs into the lungs every 6 (six) hours as needed for wheezing or shortness of breath.   [DISCONTINUED] ARIPiprazole (ABILIFY) 10 MG tablet 1 TAB BY MOUTH EVERY DAY AT BEDTIME   [DISCONTINUED] celecoxib (CELEBREX) 200 MG capsule TAKE 1 CAPSULE BY MOUTH DAILY  AFTER BREAKFAST   [DISCONTINUED] Dextromethorphan-Guaifenesin 60-1200 MG 12hr tablet Take 1 tablet by mouth every 12 (twelve) hours. Prn cough   [DISCONTINUED] Dextromethorphan-Guaifenesin 60-1200 MG 12hr tablet Take 1 tablet by mouth every 12 (twelve) hours. Prn cough. For h/o HTN brand   [DISCONTINUED] donepezil (ARICEPT) 5 MG tablet  TAKE 1 TABLET BY MOUTH EVERY NIGHT AT BEDTIME   [DISCONTINUED] FLUoxetine (PROZAC) 40 MG capsule 1 CAPSULE BY MOUTH EVERY MORNING   [DISCONTINUED] fluticasone (FLONASE) 50 MCG/ACT nasal spray Place 2 sprays into both nostrils daily. Prn after nasal saline   [DISCONTINUED] furosemide (LASIX) 20 MG tablet Take 0.5 tablets (10 mg total) by mouth daily as needed. In am   [DISCONTINUED] memantine (NAMENDA) 5 MG tablet TAKE 1 TABLET (5 MG TOTAL) BY MOUTH 2 (TWO) TIMES DAILY.   [DISCONTINUED] methylPREDNISolone (MEDROL DOSEPAK) 4 MG TBPK tablet 6 day dose pack - take as directed   [DISCONTINUED] metoprolol succinate (TOPROL-XL) 50 MG 24 hr tablet TAKE 1 TABLET DAILY   [DISCONTINUED] pantoprazole (PROTONIX) 40 MG tablet TAKE 1 TABLET (40 MG TOTAL) BY MOUTH DAILY. 30 MINUTE(S) BEFORE BREAKFAST   [DISCONTINUED] rosuvastatin (CRESTOR) 10 MG tablet Take 1 tablet (10 mg total) by mouth at bedtime.   [DISCONTINUED] traZODone (DESYREL) 100 MG tablet Take 1 tablet (100 mg total) by mouth at bedtime as needed for sleep.   No Known Allergies Recent Results (from the past 2160 hour(s))  PSA     Status: None   Collection Time: 03/28/22  3:58 PM  Result Value Ref Range   Prostate Specific Ag, Serum <0.1 0.0 - 4.0 ng/mL    Comment: Roche ECLIA methodology. According to the American Urological Association, Serum PSA should decrease and remain at undetectable levels after radical prostatectomy. The AUA defines biochemical recurrence as an initial PSA value 0.2 ng/mL or greater followed by a subsequent confirmatory PSA value 0.2 ng/mL or greater. Values obtained with different assay methods or kits cannot be used interchangeably. Results cannot be interpreted as absolute evidence of the presence or absence of malignant disease.   Bladder Scan (Post Void Residual) in office     Status: None   Collection Time: 04/01/22  1:50 PM  Result Value Ref Range   Scan Result 68ml    Objective  Body mass index is 35.86  kg/m. Wt Readings from Last 3 Encounters:  04/08/22 264 lb 6.4 oz (119.9 kg)  04/03/22 262 lb 4 oz (119 kg)  04/01/22 268 lb (121.6 kg)   Temp Readings from Last 3 Encounters:  04/08/22 98 F (36.7 C) (Oral)  01/30/22 97.9 F (36.6 C) (Temporal)  12/31/21 (!) 97.1 F (36.2 C) (Tympanic)   BP Readings from Last 3 Encounters:  04/08/22 100/60  04/03/22 100/64  04/01/22 126/77   Pulse Readings from Last 3 Encounters:  04/08/22 86  04/03/22 76  04/01/22 79    Physical Exam Vitals and nursing note reviewed.  Constitutional:      Appearance: Normal appearance. He is well-developed and well-groomed.  HENT:     Head: Normocephalic and atraumatic.  Eyes:     Conjunctiva/sclera: Conjunctivae normal.     Pupils: Pupils are equal, round, and reactive to light.  Cardiovascular:     Rate and Rhythm: Normal rate and regular rhythm.     Heart sounds: Normal heart sounds.  Pulmonary:     Effort: Pulmonary effort is normal. No respiratory distress.     Breath sounds: Normal breath sounds.  Abdominal:     Tenderness: There  is no abdominal tenderness.  Skin:    General: Skin is warm and moist.  Neurological:     General: No focal deficit present.     Mental Status: He is alert and oriented to person, place, and time. Mental status is at baseline.     Sensory: Sensation is intact.     Motor: Motor function is intact.     Coordination: Coordination is intact.     Gait: Gait is intact. Gait normal.  Psychiatric:        Attention and Perception: Attention and perception normal.        Mood and Affect: Mood and affect normal.        Speech: Speech normal.        Behavior: Behavior normal. Behavior is cooperative.        Thought Content: Thought content normal.        Cognition and Memory: Cognition and memory normal.        Judgment: Judgment normal.     Assessment  Plan  COPD exacerbation ([place]) - Plan: levofloxacin (LEVAQUIN) 750 MG tablet, DISCONTINUED:  Dextromethorphan-Guaifenesin 60-1200 MG 12hr tablet Rec smoking cessation f/u pulm  AKI (acute kidney injury) ([place]) - Plan: Basic Metabolic Panel (BMET)  Prediabetes - Plan: Hemoglobin A1c  Osteoarthritis of multiple joints, unspecified osteoarthritis type - Plan: celecoxib (CELEBREX) 200 MG capsule  Gastroesophageal reflux disease, unspecified whether esophagitis present - Plan: pantoprazole (PROTONIX) 40 MG tablet  Hyperlipidemia, unspecified hyperlipidemia type - Plan: rosuvastatin (CRESTOR) 10 MG tablet  Alcoholic cirrhosis, unspecified whether ascites present ([place]) - Plan: furosemide (LASIX) 20 MG tablet  Leg edema - Plan: furosemide (LASIX) 20 MG tablet  Pulmonary emphysema, unspecified emphysema type ([place]) - Plan: albuterol (VENTOLIN HFA) 108 (90 Base) MCG/ACT inhaler  Stage 2 moderate COPD by GOLD classification (HCC) - Recheck PFTsChange Symbicort to [employer] 2 puffs twice a dayAlbuterol nebulizer solution for as needed useQUIT SMOKING - Plan: albuterol (PROVENTIL) (2.5 MG/3ML) 0.083% nebulizer solution  Cognitive impairment - Plan: memantine (NAMENDA) 5 MG tablet, donepezil (ARICEPT) 5 MG tablet  Seborrheic keratosis left chest   HM Flu shot given shot today prevnar 13 04/15/19 Prevnar 20 consider the future   Tdap consider future rx today 12/27/20 shingrix consider future  covid vx per pt had 3/3 pfizer rec 4th pt declines 06/28/21   COPD + Smoker 1 ppd x 55 years as of 07/2019 and had quit x 2 years in the past  CT chest sch 05/17/20 s/p LLL resection 11/09/19 and CT chest repeat 11/16/20    H/o hep C tx'ed [facility] last 11/13/15 not detected quant will recheck with hep A immune 09/21/14/hep B >1000 09/21/14 and sAg negative  Alcohol abuse drinks 1 pt liquor qd as of 11/01/20 cute back    H/o prostate cancer s/p brachytx with seeds PSA 0.1 12/21/20 f/u Dr.Brandon urology 10/2019 and Dr. Massie Maroon rad/onc PSA 05/23/20 <0.01  F/u urology 5/22    Skin-normal exam except for  bruising and reason for visit  -seen [place] [facility] 2021or Dr. Phillip Heal    EGD/colonoscopy 10/04/19 Colonoscopy 10/04/19 tubular and hyperplastic Dr. Allen Norris f/u in 5 years   Rec smoking cessation and etoh per pt quit   As of 06/28/21 consider [facility] f/u    Neurology Dr. Manuella Ghazi Cards Dr. Fletcher Anon  [facility] psych  pulm Dr. Patsey Berthold  Provider: Dr. Olivia Mackie McLean-Scocuzza-Internal Medicine

## 2022-04-08 NOTE — Progress Notes (Signed)
Christopher Orr please inform pt to take only myrbetriq and flomax for his urinary issues NOT Gemtasa  Thank you Dr. Erlene Quan and this is why medication reconciliation is important when changes are made to help patients understand what to do and take   Take care

## 2022-04-10 NOTE — Progress Notes (Signed)
Pt has been informed and has verbalized understanding

## 2022-04-18 ENCOUNTER — Encounter: Payer: Self-pay | Admitting: Pulmonary Disease

## 2022-04-22 ENCOUNTER — Ambulatory Visit (INDEPENDENT_AMBULATORY_CARE_PROVIDER_SITE_OTHER): Payer: Medicare HMO | Admitting: *Deleted

## 2022-04-22 DIAGNOSIS — Z23 Encounter for immunization: Secondary | ICD-10-CM | POA: Diagnosis not present

## 2022-04-22 NOTE — Progress Notes (Signed)
Pt received Prevnar 20 vaccine in left deltoid & tolerated it well. No complaints or concerns

## 2022-04-23 ENCOUNTER — Other Ambulatory Visit: Payer: Self-pay | Admitting: Internal Medicine

## 2022-04-23 ENCOUNTER — Telehealth: Payer: Self-pay

## 2022-04-23 DIAGNOSIS — N400 Enlarged prostate without lower urinary tract symptoms: Secondary | ICD-10-CM

## 2022-04-23 DIAGNOSIS — F32A Anxiety disorder, unspecified: Secondary | ICD-10-CM

## 2022-04-23 DIAGNOSIS — M159 Polyosteoarthritis, unspecified: Secondary | ICD-10-CM

## 2022-04-23 DIAGNOSIS — R4189 Other symptoms and signs involving cognitive functions and awareness: Secondary | ICD-10-CM

## 2022-04-23 MED ORDER — DONEPEZIL HCL 5 MG PO TABS: 5.0000 mg | ORAL_TABLET | Freq: Every day | ORAL | 3 refills | Status: DC

## 2022-04-23 MED ORDER — FLUOXETINE HCL 20 MG PO TABS: 20.0000 mg | ORAL_TABLET | Freq: Every day | ORAL | 3 refills | Status: DC

## 2022-04-23 MED ORDER — MIRABEGRON ER 50 MG PO TB24: 50.0000 mg | ORAL_TABLET | Freq: Every day | ORAL | 3 refills | Status: DC

## 2022-04-23 NOTE — Patient Outreach (Signed)
  Care Coordination   Initial Visit Note   04/23/2022 Name: SEABORN NAKAMA MRN: [id number] DOB: 1946-02-24  MIKIE MISNER is a 76 y.o. year old male who sees McLean-Scocuzza, [name], MD for primary care. I spoke with  [name] by phone today.  What matters to the patients health and wellness today?  Patient states he does not have any needs or concerns at this time.     Goals Addressed             This Visit's Progress    COMPLETED: Care coordination activities - no follow up needed       Care Coordination Interventions: Care coordination program/ services discussed  Social determinants of health survey completed Patient advised to contact primary care provider office  if care coordination services needed in the future           SDOH assessments and interventions completed:  Yes  SDOH Interventions Today    Flowsheet Row Most Recent Value  SDOH Interventions   Food Insecurity Interventions Intervention Not Indicated  Housing Interventions Intervention Not Indicated  Transportation Interventions Intervention Not Indicated        Care Coordination Interventions Activated:  No  Care Coordination Interventions:  No, not indicated   Follow up plan: No further intervention required.   Encounter Outcome:  Pt. Visit Completed   Quinn Plowman RN,BSN,CCM [place] [phone] direct line

## 2022-04-29 ENCOUNTER — Ambulatory Visit (INDEPENDENT_AMBULATORY_CARE_PROVIDER_SITE_OTHER): Payer: Medicare HMO

## 2022-04-29 ENCOUNTER — Other Ambulatory Visit: Payer: Self-pay

## 2022-04-29 ENCOUNTER — Telehealth: Payer: Self-pay

## 2022-04-29 ENCOUNTER — Other Ambulatory Visit: Payer: Self-pay | Admitting: Internal Medicine

## 2022-04-29 VITALS — Ht 72.0 in | Wt 264.0 lb

## 2022-04-29 DIAGNOSIS — Z0000 Encounter for general adult medical examination without abnormal findings: Secondary | ICD-10-CM

## 2022-04-29 DIAGNOSIS — F419 Anxiety disorder, unspecified: Secondary | ICD-10-CM

## 2022-04-29 DIAGNOSIS — F410 Panic disorder [episodic paroxysmal anxiety] without agoraphobia: Secondary | ICD-10-CM

## 2022-04-29 MED ORDER — FLUOXETINE HCL 20 MG PO CAPS: 20.0000 mg | ORAL_CAPSULE | Freq: Every day | ORAL | 3 refills | Status: DC

## 2022-04-29 NOTE — Telephone Encounter (Signed)
Helene Kelp called from [employer] to state they need [place] to change the patient's prescription for  FLUoxetine (PROZAC) 20 MG tablet from tablet to capsule.  Helene Kelp states her fax number is [phone].

## 2022-04-29 NOTE — Patient Instructions (Signed)
Christopher Orr , Thank you for taking time to come for your Medicare Wellness Visit. I appreciate your ongoing commitment to your health goals. Please review the following plan we discussed and let me know if I can assist you in the future.   These are the goals we discussed:  Goals      Healthy lifestyle     Low carb Stay hydrated Stay active          This is a list of the screening recommended for you and due dates:  Health Maintenance  Topic Date Due   Zoster (Shingles) Vaccine (1 of 2) 07/30/2022*   COVID-19 Vaccine (4 - Pfizer risk series) 11/03/2022*   Tetanus Vaccine  04/30/2023*   Colon Cancer Screening  10/03/2029   Pneumonia Vaccine  Completed   Flu Shot  Completed   Hepatitis C Screening: USPSTF Recommendation to screen - Ages 18-79 yo.  Completed   HPV Vaccine  Aged Out  *Topic was postponed. The date shown is not the original due date.    Advanced directives: on file  Next appointment: Follow up in one year for your annual wellness visit.   Preventive Care 98 Years and Older, Male  Preventive care refers to lifestyle choices and visits with your health care provider that can promote health and wellness. What does preventive care include? A yearly physical exam. This is also called an annual well check. Dental exams once or twice a year. Routine eye exams. Ask your health care provider how often you should have your eyes checked. Personal lifestyle choices, including: Daily care of your teeth and gums. Regular physical activity. Eating a healthy diet. Avoiding tobacco and drug use. Limiting alcohol use. Practicing safe sex. Taking low doses of aspirin every day. Taking vitamin and mineral supplements as recommended by your health care provider. What happens during an annual well check? The services and screenings done by your health care provider during your annual well check will depend on your age, overall health, lifestyle risk factors, and family history  of disease. Counseling  Your health care provider may ask you questions about your: Alcohol use. Tobacco use. Drug use. Emotional well-being. Home and relationship well-being. Sexual activity. Eating habits. History of falls. Memory and ability to understand (cognition). Work and work Statistician. Screening  You may have the following tests or measurements: Height, weight, and BMI. Blood pressure. Lipid and cholesterol levels. These may be checked every 5 years, or more frequently if you are over 78 years old. Skin check. Lung cancer screening. You may have this screening every year starting at age 93 if you have a 30-pack-year history of smoking and currently smoke or have quit within the past 15 years. Fecal occult blood test (FOBT) of the stool. You may have this test every year starting at age 35. Flexible sigmoidoscopy or colonoscopy. You may have a sigmoidoscopy every 5 years or a colonoscopy every 10 years starting at age 39. Prostate cancer screening. Recommendations will vary depending on your family history and other risks. Hepatitis C blood test. Hepatitis B blood test. Sexually transmitted disease (STD) testing. Diabetes screening. This is done by checking your blood sugar (glucose) after you have not eaten for a while (fasting). You may have this done every 1-3 years. Abdominal aortic aneurysm (AAA) screening. You may need this if you are a current or former smoker. Osteoporosis. You may be screened starting at age 31 if you are at high risk. Talk with your health care provider  about your test results, treatment options, and if necessary, the need for more tests. Vaccines  Your health care provider may recommend certain vaccines, such as: Influenza vaccine. This is recommended every year. Tetanus, diphtheria, and acellular pertussis (Tdap, Td) vaccine. You may need a Td booster every 10 years. Zoster vaccine. You may need this after age 84. Pneumococcal 13-valent  conjugate (PCV13) vaccine. One dose is recommended after age 70. Pneumococcal polysaccharide (PPSV23) vaccine. One dose is recommended after age 70. Talk to your health care provider about which screenings and vaccines you need and how often you need them. This information is not intended to replace advice given to you by your health care provider. Make sure you discuss any questions you have with your health care provider. Document Released: 08/10/2015 Document Revised: 04/02/2016 Document Reviewed: 05/15/2015 Elsevier Interactive Patient Education  2017 [place] Prevention in the Home Falls can cause injuries. They can happen to people of all ages. There are many things you can do to make your home safe and to help prevent falls. What can I do on the outside of my home? Regularly fix the edges of walkways and driveways and fix any cracks. Remove anything that might make you trip as you walk through a door, such as a raised step or threshold. Trim any bushes or trees on the path to your home. Use bright outdoor lighting. Clear any walking paths of anything that might make someone trip, such as rocks or tools. Regularly check to see if handrails are loose or broken. Make sure that both sides of any steps have handrails. Any raised decks and porches should have guardrails on the edges. Have any leaves, snow, or ice cleared regularly. Use sand or salt on walking paths during winter. Clean up any spills in your garage right away. This includes oil or grease spills. What can I do in the bathroom? Use night lights. Install grab bars by the toilet and in the tub and shower. Do not use towel bars as grab bars. Use non-skid mats or decals in the tub or shower. If you need to sit down in the shower, use a plastic, non-slip stool. Keep the floor dry. Clean up any water that spills on the floor as soon as it happens. Remove soap buildup in the tub or shower regularly. Attach bath mats  securely with double-sided non-slip rug tape. Do not have throw rugs and other things on the floor that can make you trip. What can I do in the bedroom? Use night lights. Make sure that you have a light by your bed that is easy to reach. Do not use any sheets or blankets that are too big for your bed. They should not hang down onto the floor. Have a firm chair that has side arms. You can use this for support while you get dressed. Do not have throw rugs and other things on the floor that can make you trip. What can I do in the kitchen? Clean up any spills right away. Avoid walking on wet floors. Keep items that you use a lot in easy-to-reach places. If you need to reach something above you, use a strong step stool that has a grab bar. Keep electrical cords out of the way. Do not use floor polish or wax that makes floors slippery. If you must use wax, use non-skid floor wax. Do not have throw rugs and other things on the floor that can make you trip. What can I do  with my stairs? Do not leave any items on the stairs. Make sure that there are handrails on both sides of the stairs and use them. Fix handrails that are broken or loose. Make sure that handrails are as long as the stairways. Check any carpeting to make sure that it is firmly attached to the stairs. Fix any carpet that is loose or worn. Avoid having throw rugs at the top or bottom of the stairs. If you do have throw rugs, attach them to the floor with carpet tape. Make sure that you have a light switch at the top of the stairs and the bottom of the stairs. If you do not have them, ask someone to add them for you. What else can I do to help prevent falls? Wear shoes that: Do not have high heels. Have rubber bottoms. Are comfortable and fit you well. Are closed at the toe. Do not wear sandals. If you use a stepladder: Make sure that it is fully opened. Do not climb a closed stepladder. Make sure that both sides of the stepladder  are locked into place. Ask someone to hold it for you, if possible. Clearly mark and make sure that you can see: Any grab bars or handrails. First and last steps. Where the edge of each step is. Use tools that help you move around (mobility aids) if they are needed. These include: Canes. Walkers. Scooters. Crutches. Turn on the lights when you go into a dark area. Replace any light bulbs as soon as they burn out. Set up your furniture so you have a clear path. Avoid moving your furniture around. If any of your floors are uneven, fix them. If there are any pets around you, be aware of where they are. Review your medicines with your doctor. Some medicines can make you feel dizzy. This can increase your chance of falling. Ask your doctor what other things that you can do to help prevent falls. This information is not intended to replace advice given to you by your health care provider. Make sure you discuss any questions you have with your health care provider. Document Released: 05/10/2009 Document Revised: 12/20/2015 Document Reviewed: 08/18/2014 Elsevier Interactive Patient Education  2017 [employer].

## 2022-04-29 NOTE — Progress Notes (Signed)
Subjective:   Christopher Orr is a 76 y.o. male who presents for Medicare Annual/Subsequent preventive examination.  Review of Systems    No ROS.  Medicare Wellness Virtual Visit.  Visual/audio telehealth visit, UTA vital signs.   See social history for additional risk factors.   Cardiac Risk Factors include: advanced age (>68men, >14 women);male gender;hypertension     Objective:    Today's Vitals   04/29/22 1052  Weight: 264 lb (119.7 kg)  Height: 6' (1.829 m)   Body mass index is 35.8 kg/m.     04/29/2022   10:57 AM 12/31/2021   11:20 AM 08/08/2021    1:04 PM 08/08/2021    6:52 AM 07/25/2021   11:32 AM 06/30/2021    4:45 AM 06/25/2021    2:09 PM  Advanced Directives  Does Patient Have a Medical Advance Directive? Yes Yes No No No Yes Yes  Type of Paramedic of [employer];Living will Out of facility DNR (pink MOST or yellow form)    Healthcare Power of [place];Living will  Does patient want to make changes to medical advance directive? No - Patient declined      No - Patient declined  Copy of [place] in Chart? Yes - validated most recent copy scanned in chart (See row information)        Would patient like information on creating a medical advance directive?    No - Patient declined No - Patient declined  No - Patient declined    Current Medications (verified) Outpatient Encounter Medications as of 04/29/2022  Medication Sig   albuterol (PROVENTIL) (2.5 MG/3ML) 0.083% nebulizer solution Take 3 mLs (2.5 mg total) by nebulization every 6 (six) hours as needed for wheezing or shortness of breath.   albuterol (VENTOLIN HFA) 108 (90 Base) MCG/ACT inhaler Inhale 1-2 puffs into the lungs every 6 (six) hours as needed for wheezing or shortness of breath.   ARIPiprazole (ABILIFY) 10 MG tablet TAKE 1 TABLET BY MOUTH AT BEDTIME   aspirin EC 81 MG tablet Take 81 mg by mouth daily.   Budeson-Glycopyrrol-Formoterol  (BREZTRI AEROSPHERE) 160-9-4.8 MCG/ACT AERO Inhale 2 puffs into the lungs in the morning and at bedtime.   celecoxib (CELEBREX) 200 MG capsule TAKE 1 CAPSULE BY MOUTH DAILY AFTER BREAKFAST   clopidogrel (PLAVIX) 75 MG tablet Take 1 tablet (75 mg total) by mouth daily.   donepezil (ARICEPT) 5 MG tablet Take 1 tablet (5 mg total) by mouth at bedtime.   FLUoxetine (PROZAC) 20 MG tablet Take 1 tablet (20 mg total) by mouth daily. D/c 40 mg due to ddi with metoprolol   fluticasone (FLONASE) 50 MCG/ACT nasal spray Place 2 sprays into both nostrils daily. Prn after nasal saline   furosemide (LASIX) 20 MG tablet Take 0.5 tablets (10 mg total) by mouth daily as needed. In am   levofloxacin (LEVAQUIN) 750 MG tablet Take 1 tablet (750 mg total) by mouth daily. With food   memantine (NAMENDA) 5 MG tablet Take 1 tablet (5 mg total) by mouth 2 (two) times daily.   metoprolol succinate (TOPROL-XL) 50 MG 24 hr tablet Take 1 tablet (50 mg total) by mouth daily. Take with or immediately following a meal.   mirabegron ER (MYRBETRIQ) 50 MG TB24 tablet Take 1 tablet (50 mg total) by mouth daily.   Multiple Vitamins-Minerals (MULTIVITAMIN WITH MINERALS) tablet Take 1 tablet by mouth daily.   ondansetron (ZOFRAN-ODT) 4 MG disintegrating tablet Take 1  tablet (4 mg total) by mouth every 8 (eight) hours as needed for nausea or vomiting.   pantoprazole (PROTONIX) 40 MG tablet TAKE 1 TABLET (40 MG TOTAL) BY MOUTH DAILY. 30 MINUTE(S) BEFORE BREAKFAST   rosuvastatin (CRESTOR) 10 MG tablet Take 1 tablet (10 mg total) by mouth at bedtime.   sodium chloride (OCEAN) 0.65 % SOLN nasal spray Place 2 sprays into both nostrils as needed for congestion.   spironolactone (ALDACTONE) 25 MG tablet Take 0.5 tablets (12.5 mg total) by mouth daily as needed. In am   sucralfate (CARAFATE) 1 g tablet TAKE 1 TABLET BY MOUTH FOUR TIMES A DAY   tamsulosin (FLOMAX) 0.4 MG CAPS capsule TAKE 1 CAPSULE BY MOUTH EVERY EVENING AFTER SUPPER   traZODone  (DESYREL) 100 MG tablet Take 1 tablet (100 mg total) by mouth at bedtime as needed for sleep.   No facility-administered encounter medications on file as of 04/29/2022.    Allergies (verified) Patient has no known allergies.   History: Past Medical History:  Diagnosis Date   Alcohol abuse    pt states it is marijuana   Anemia    Arthritis    Bipolar disorder (HCC)    BPH (benign prostatic hypertrophy) with urinary obstruction    CAD (coronary artery disease)    a. 2006 PCI/DES to LAD, EF 60%.   Chronic heart failure with preserved ejection fraction (HFpEF) ([place])    a. 07/2014 Echo: EF 55%; b. 11/2020 Echo: EF 50-55%, mod LVH, GrI DD, mildly enlarged RV w/ nl fxn, mild BAE.   Chronic pancreatitis (HCC)    Cirrhosis ([place])    COPD (chronic obstructive pulmonary disease) ([place])    COVID-19    08/22/20   Dementia ([place])    early dementia   Depression    Dilated ascending aorta and aortic root ([place])    a. 11/2020 Echo: Ao root 51mm, Asc Ao 59mm. Ao arch 48mm.   Diverticulitis    12/06/20 [facility]    Diverticulosis    Drug use    Dyspnea    ED (erectile dysfunction)    Gastritis    GERD (gastroesophageal reflux disease)    Headache    occasional migraines   Hepatitis C    treated   History of lumbar fusion    Hyperlipidemia    Hypertension    patient denies having high blood pressure   Lung cancer ([place])    Pancreatitis    x 2    Pneumonia    07/06/18    Premature atrial contractions    a. 04/2021 [facility]: Freq PACs w/ 8% burden.   Prostate cancer ([place])    with seeds   PSVT (paroxysmal supraventricular tachycardia)    a. 04/2021 [facility]: Avg HR 80 (60-210). 42 SVT runs - longest 5 beats @ 116, fastest 210 x 5 beats. Freq PACs (8%).   PTSD (post-traumatic stress disorder)    Rib fracture    s/p fall 03/15/19    Sinus disease    Urge incontinence    Past Surgical History:  Procedure Laterality Date   CARDIAC CATHETERIZATION  2006   cateract  2013   COLONOSCOPY WITH PROPOFOL N/A  10/04/2019   Procedure: COLONOSCOPY WITH PROPOFOL;  Surgeon: Lucilla Lame, MD;  Location: [facility] END[facility]COPY;  Service: Endoscopy;  Laterality: N/A;   CORONARY ANGIOPLASTY  2006   CORONARY STENT PLACEMENT  2006   x 1   CYSTOSCOPY W/ RETROGRADES N/A 08/08/2021   Procedure: CYSTOSCOPY WITH RETROGRADE PYELOGRAM;  Surgeon: Hollice Espy, MD;  Location: [facility] ORS;  Service: Urology;  Laterality: N/A;   CYSTOSCOPY WITH URETHRAL DILATATION N/A 08/08/2021   Procedure: CYSTOSCOPY WITH URETHRAL DILATATION WITH UROLUME BALLOON;  Surgeon: Hollice Espy, MD;  Location: [facility] ORS;  Service: Urology;  Laterality: N/A;   ESOPHAGOGASTRODUODENOSCOPY N/A 12/22/2014   Procedure: ESOPHAGOGASTRODUODENOSCOPY (EGD);  Surgeon: [name], MD;  Location: [facility] ENDOSCOPY;  Service: Endoscopy;  Laterality: N/A;   ESOPHAGOGASTRODUODENOSCOPY (EGD) WITH PROPOFOL N/A 10/04/2019   Procedure: ESOPHAGOGASTRODUODENOSCOPY (EGD) WITH PROPOFOL;  Surgeon: Lucilla Lame, MD;  Location: [facility] ENDOSCOPY;  Service: Endoscopy;  Laterality: N/A;   HERNIA REPAIR  2015   left groin   INTERCOSTAL NERVE BLOCK Left 11/09/2019   Procedure: Intercostal Nerve Block;  Surgeon: Melrose Nakayama, MD;  Location: [place];  Service: Thoracic;  Laterality: Left;   LUMBAR FUSION     RADIOACTIVE SEED IMPLANT N/A 04/22/2016   Procedure: RADIOACTIVE SEED IMPLANT/BRACHYTHERAPY IMPLANT;  Surgeon: Hollice Espy, MD;  Location: [facility] ORS;  Service: Urology;  Laterality: N/A;   ROBOT ASSISTED INGUINAL HERNIA REPAIR Bilateral 07/06/2018   Procedure: ROBOT ASSISTED INGUINAL HERNIA REPAIR;  Surgeon: Jules Husbands, MD;  Location: [facility] ORS;  Service: General;  Laterality: Bilateral;   TONSILLECTOMY     UMBILICAL HERNIA REPAIR N/A 07/06/2018   Procedure: LAPAROSCOPIC ROBOT ASSISTED UMBILICAL HERNIA;  Surgeon: Jules Husbands, MD;  Location: [facility] ORS;  Service: General;  Laterality: N/A;   XI ROBOTIC ASSISTED THORACOSCOPY- SEGMENTECTOMY Left 11/09/2019   Procedure: XI  ROBOTIC ASSISTED THORACOSCOPY-WEDGE RESECTION LEFT LOWER LOBE;  Surgeon: Melrose Nakayama, MD;  Location: [facility];  Service: Thoracic;  Laterality: Left;   Family History  Problem Relation Age of Onset   Heart disease Father        Unclear details. Father passed in late 53's 2/2 cancer.   Colon cancer Father    Alcohol abuse Sister    Drug abuse Sister    Anxiety disorder Sister    Depression Sister    COPD Brother    Obesity Brother    Kidney disease Neg Hx    Prostate cancer Neg Hx    Social History   Socioeconomic History   Marital status: Divorced    Spouse name: Not on file   Number of children: 4   Years of education: Not on file   Highest education level: GED or equivalent  Occupational History   Occupation: Aeronautical engineer    Comment: retired  Tobacco Use   Smoking status: Every Day    Packs/day: 1.50    Years: 55.00    Total pack years: 82.50    Types: Cigarettes    Start date: 07/28/1962   Smokeless tobacco: Never   Tobacco comments:    1PPD 01/30/2022  Vaping Use   Vaping Use: Never used  Substance and Sexual Activity   Alcohol use: Yes    Alcohol/week: 1.0 standard drink of alcohol    Types: 1 Cans of beer per week    Comment: 2-3 beers a day   Drug use: Yes    Frequency: 7.0 times per week    Types: Marijuana    Comment: Endorsed heroin 06/2014, UDS also + benzos, opiates, THC, neg for cocaine at that time.   Sexual activity: Not Currently  Other Topics Concern   Not on file  Social History Narrative   Lives at home    Has kids and grandkids   Smoker x 55 years 1 ppd as of 07/2019  he did quit x 2 years    Drinks 1 pt liq qd as of 07/2019    Social Determinants of Health   Financial Resource Strain: Low Risk  (04/29/2022)   Overall Financial Resource Strain (CARDIA)    Difficulty of Paying Living Expenses: Not hard at all  Food Insecurity: No Food Insecurity (04/23/2022)   Hunger Vital Sign    Worried About Running Out of Food in the Last  Year: Never true    Ran Out of Food in the Last Year: Never true  Transportation Needs: No Transportation Needs (04/23/2022)   PRAPARE - Hydrologist (Medical): No    Lack of Transportation (Non-Medical): No  Physical Activity: Inactive (12/25/2017)   Exercise Vital Sign    Days of Exercise per Week: 0 days    Minutes of Exercise per Session: 0 min  Stress: No Stress Concern Present (04/29/2022)   [place]    Feeling of Stress : Not at all  Social Connections: Moderately Isolated (04/17/2021)   Social Connection and Isolation Panel [NHANES]    Frequency of Communication with Friends and Family: More than three times a week    Frequency of Social Gatherings with Friends and Family: Once a week    Attends Religious Services: Never    Marine scientist or Organizations: Yes    Attends Music therapist: More than 4 times per year    Marital Status: Divorced    Tobacco Counseling Ready to quit: Not Answered Counseling given: Not Answered Tobacco comments: 1PPD 01/30/2022   Clinical Intake:  Pre-visit preparation completed: Yes        Diabetes: No  How often do you need to have someone help you when you read instructions, pamphlets, or other written materials from your doctor or pharmacy?: 1 - Never    Interpreter Needed?: No      Activities of Daily Living    04/29/2022   10:51 AM 08/08/2021    6:56 AM  In your present state of health, do you have any difficulty performing the following activities:  Hearing? 0 0  Vision? 0 0  Difficulty concentrating or making decisions? 1 1  Comment Taking medication as directed. Followed by PCP some, has some dimentia  Walking or climbing stairs? 1 1  Comment  due to back and knee pain  Dressing or bathing? 0 0  Doing errands, shopping? 0   Preparing Food and eating ? N   Using the Toilet? N   In the past six months,  have you accidently leaked urine? N   Do you have problems with loss of bowel control? N   Managing your Medications? N   Comment Pill pack   Managing your Finances? N   Housekeeping or managing your Housekeeping? N     Patient Care Team: McLean-Scocuzza, [name], MD as PCP - General (Internal Medicine) Wellington Hampshire, MD as PCP - Cardiology (Cardiology) Vickie Epley, MD as PCP - Electrophysiology (Cardiology) Telford Nab, RN as Oncology Nurse Navigator  Indicate any recent Medical Services you may have received from other than [facility] providers in the past year (date may be approximate).     Assessment:   This is a routine wellness examination for [place].  I connected with  [name] on 04/29/22 by a audio enabled telemedicine application and verified that I am speaking with the correct person using two identifiers.  Patient Location: Home  Provider Location: Office/Clinic  I discussed the limitations of evaluation and management by telemedicine. The patient expressed understanding and agreed to proceed.   Hearing/Vision screen Hearing Screening - Comments:: Patient is able to hear conversational tones without difficulty. No issues reported.  Vision Screening - Comments:: Wears corrective lenses   Dietary issues and exercise activities discussed: Current Exercise Habits: The patient has a physically strenuous job, but has no regular exercise apart from work. Regular diet Good water intake   Goals Addressed               This Visit's Progress     Patient Stated     COMPLETED: [place] (pt-stated)        To be completed.        Depression Screen    04/29/2022   11:09 AM 04/08/2022    1:15 PM 01/03/2022    1:08 PM 04/17/2021    9:56 AM 12/27/2020    2:11 PM 04/16/2020    9:44 AM 11/15/2019    3:26 PM  PHQ 2/9 Scores  PHQ - 2 Score  3 0 0 2 0 6  PHQ- 9 Score  13   10  18   Exception Documentation Other- indicate reason in comment box           Fall Risk    04/29/2022   10:58 AM 04/08/2022    1:15 PM 01/03/2022    1:08 PM 07/18/2021    7:36 AM 06/28/2021    1:33 PM  Fall Risk   Falls in the past year? 0 0 0 0 1  Number falls in past yr: 0 0 0 0 0  Injury with Fall? 0 0 0 0 0  Risk for fall due to : Impaired balance/gait No Fall Risks No Fall Risks  No Fall Risks  Risk for fall due to: Comment Cane in use when ambulating      Follow up Falls evaluation completed;Falls prevention discussed Falls evaluation completed Falls evaluation completed Falls evaluation completed Falls evaluation completed    FALL RISK PREVENTION PERTAINING TO THE HOME: Home free of loose throw rugs in walkways, pet beds, electrical cords, etc? Yes  Adequate lighting in your home to reduce risk of falls? Yes   ASSISTIVE DEVICES UTILIZED TO PREVENT FALLS: Life alert? No  Use of a cane, walker or w/c? Yes  Grab bars in the bathroom? No  Shower chair or bench in shower? Yes  Elevated toilet seat or a handicapped toilet? No   TIMED UP AND GO: Was the test performed? Yes .   Cognitive Function:  Late onset Alzheimer's disease without behavioral disturbance ([place]). Taking medication as directed.       04/29/2022   11:11 AM 04/17/2021   10:29 AM 04/16/2020    9:40 AM 04/06/2019   11:49 AM  6CIT Screen  What Year? 0 points 0 points 0 points 0 points  What month? 0 points 0 points 0 points 0 points  What time? 0 points 0 points 0 points 0 points  Count back from 20 0 points   0 points  Months in reverse 0 points   0 points  Repeat phrase 0 points   0 points  Total Score 0 points   0 points    Immunizations Immunization History  Administered Date(s) Administered   Fluad Quad(high Dose 65+) 05/04/2019, 04/08/2022   Influenza, High Dose Seasonal PF 04/15/2019, 05/10/2020   Influenza,inj,Quad PF,6+ Mos 05/04/2018  Influenza-Unspecified 04/27/2021   PFIZER(Purple Top)SARS-COV-2 Vaccination 09/28/2019, 10/19/2019, 05/10/2020   PNEUMOCOCCAL  CONJUGATE-20 04/22/2022   Pneumococcal Conjugate-13 04/15/2019    TDAP status: Due, Education has been provided regarding the importance of this vaccine. Advised may receive this vaccine at local pharmacy or Health Dept. Aware to provide a copy of the vaccination record if obtained from local pharmacy or Health Dept. Verbalized acceptance and understanding.  Shingrix Completed?: No.    Education has been provided regarding the importance of this vaccine. Patient has been advised to call insurance company to determine out of pocket expense if they have not yet received this vaccine. Advised may also receive vaccine at local pharmacy or Health Dept. Verbalized acceptance and understanding.  Screening Tests Health Maintenance  Topic Date Due   Zoster Vaccines- Shingrix (1 of 2) 07/30/2022 (Originally 08/12/1964)   COVID-19 Vaccine (4 - Pfizer risk series) 11/03/2022 (Originally 07/05/2020)   TETANUS/TDAP  04/30/2023 (Originally 08/12/1964)   COLONOSCOPY (Pts 45-20yrs Insurance coverage will need to be confirmed)  10/03/2029   Pneumonia Vaccine 59+ Years old  Completed   INFLUENZA VACCINE  Completed   Hepatitis C Screening  Completed   HPV VACCINES  Aged Out   Health Maintenance There are no preventive care reminders to display for this patient.  Lung Cancer Screening: (Low Dose CT Chest recommended if Age 38-80 years, 30 pack-year currently smoking OR have quit w/in 15years.) does not qualify.   Vision Screening: Recommended annual ophthalmology exams for early detection of glaucoma and other disorders of the eye.  Dental Screening: Recommended annual dental exams for proper oral hygiene  Community Resource Referral / Chronic Care Management: CRR required this visit?  No   CCM required this visit?  No      Plan:     I have personally reviewed and noted the following in the patient's chart:   Medical and social history Use of alcohol, tobacco or illicit drugs  Current medications  and supplements including opioid prescriptions. Patient is not currently taking opioid prescriptions. Functional ability and status Nutritional status Physical activity Advanced directives List of other physicians Hospitalizations, surgeries, and ER visits in previous 12 months Vitals Screenings to include cognitive, depression, and falls Referrals and appointments  In addition, I have reviewed and discussed with patient certain preventive protocols, quality metrics, and best practice recommendations. A written personalized care plan for preventive services as well as general preventive health recommendations were provided to patient.     Varney Biles, LPN   11/02/8889

## 2022-05-14 ENCOUNTER — Telehealth: Payer: Self-pay | Admitting: Family Medicine

## 2022-05-14 ENCOUNTER — Telehealth: Payer: Self-pay

## 2022-05-14 ENCOUNTER — Encounter: Payer: Self-pay | Admitting: Family Medicine

## 2022-05-14 ENCOUNTER — Ambulatory Visit (INDEPENDENT_AMBULATORY_CARE_PROVIDER_SITE_OTHER): Payer: Medicare HMO | Admitting: Family Medicine

## 2022-05-14 DIAGNOSIS — F32A Depression, unspecified: Secondary | ICD-10-CM

## 2022-05-14 DIAGNOSIS — F419 Anxiety disorder, unspecified: Secondary | ICD-10-CM | POA: Diagnosis not present

## 2022-05-14 DIAGNOSIS — N529 Male erectile dysfunction, unspecified: Secondary | ICD-10-CM

## 2022-05-14 DIAGNOSIS — J449 Chronic obstructive pulmonary disease, unspecified: Secondary | ICD-10-CM

## 2022-05-14 NOTE — Assessment & Plan Note (Signed)
Chronic issue.  Patient is on Breztri at the moment.  He notes no change to his chronic symptoms.  I did encourage him to wear his oxygen at night if he has been advised to do so previously.  His oxygenation in the office ended up being in the acceptable range after ambulation.  He will monitor for any worsening symptoms.

## 2022-05-14 NOTE — Assessment & Plan Note (Signed)
Chronic issue worsened by his recent issues with erectile dysfunction.  He will continue Prozac 20 mg daily and Abilify 10 mg daily.  Discussed that both of those medications could be contributing to his erectile dysfunction and he may need to consider alternatives to those at some point in the future.

## 2022-05-14 NOTE — Assessment & Plan Note (Signed)
Patient is on several medications that could be contributing to this.  He also has many risk factors for vasculogenic cause.  It also appears that he has been treated for prostate cancer in the past.  We discussed that we do not typically do the alternative treatments in our office.  Discussed penile injection and intraurethral treatment or possibilities that we may need to discuss that with his urologist.  The patient wants to be treated as quickly as possible for this.  He reports he sees his urologist in the next week or 2.  Discussed I would look into the options and check with his urologist to see what the options might be for him.  We will contact him once I hear back from his urologist.

## 2022-05-14 NOTE — Telephone Encounter (Signed)
Please let the patient know that I heard back from Dr Erlene Quan. She noted that the intraurethral pill has been on back order for years and it is not available. The penile injection is a complex process and needs to be discussed in an office visit with her. She noted she would discuss this with him at his upcoming visit. At this time there is not anything that I can offer him for the erectile dysfunction prior to his visit with Dr Erlene Quan.

## 2022-05-14 NOTE — Progress Notes (Signed)
Tommi Rumps, MD Phone: [phone]  Christopher Orr is a 76 y.o. male who presents today for same day visit.   Erectile dysfunction: Patient notes this is an ongoing issue.  He is not able to attain an erection at all.  He gets no nighttime erections.  He notes he is tried Viagra and Cialis with no benefit.  He is on Prozac, metoprolol, and Abilify.  He does not drink some alcohol though he notes only a few beers occasionally.  He does have some anxiety and depression and notes it is somewhat worse though this is related to dealing with erectile despond.  No SI.  COPD: Patient's oxygen was low on presentation.  I noted this after he left the office that was able to catch him prior to him getting in his car.  We checked it after he walked all the way back from the car into the room and it was in the 92-93% range.  He notes chronic shortness of breath that stable.  He does use oxygen occasionally at night.  He has chronic cough and congestion that is unchanged.  No chest pain.  Social History   Tobacco Use  Smoking Status Every Day   Packs/day: 1.50   Years: 55.00   Total pack years: 82.50   Types: Cigarettes   Start date: 07/28/1962  Smokeless Tobacco Never  Tobacco Comments   1PPD 01/30/2022    Current Outpatient Medications on File Prior to Visit  Medication Sig Dispense Refill   albuterol (PROVENTIL) (2.5 MG/3ML) 0.083% nebulizer solution Take 3 mLs (2.5 mg total) by nebulization every 6 (six) hours as needed for wheezing or shortness of breath. 375 mL 3   albuterol (VENTOLIN HFA) 108 (90 Base) MCG/ACT inhaler Inhale 1-2 puffs into the lungs every 6 (six) hours as needed for wheezing or shortness of breath. 18 g 12   ARIPiprazole (ABILIFY) 10 MG tablet TAKE 1 TABLET BY MOUTH AT BEDTIME 90 tablet 3   aspirin EC 81 MG tablet Take 81 mg by mouth daily.     Budeson-Glycopyrrol-Formoterol (BREZTRI AEROSPHERE) 160-9-4.8 MCG/ACT AERO Inhale 2 puffs into the lungs in the morning and at  bedtime. 10.7 g 11   celecoxib (CELEBREX) 200 MG capsule TAKE 1 CAPSULE BY MOUTH DAILY AFTER BREAKFAST 28 capsule 3   clopidogrel (PLAVIX) 75 MG tablet Take 1 tablet (75 mg total) by mouth daily. 90 tablet 3   donepezil (ARICEPT) 5 MG tablet Take 1 tablet (5 mg total) by mouth at bedtime. 90 tablet 3   FLUoxetine (PROZAC) 20 MG capsule Take 1 capsule (20 mg total) by mouth daily. 90 capsule 3   fluticasone (FLONASE) 50 MCG/ACT nasal spray Place 2 sprays into both nostrils daily. Prn after nasal saline 16 g 11   furosemide (LASIX) 20 MG tablet Take 0.5 tablets (10 mg total) by mouth daily as needed. In am 30 tablet 3   levofloxacin (LEVAQUIN) 750 MG tablet Take 1 tablet (750 mg total) by mouth daily. With food 7 tablet 0   memantine (NAMENDA) 5 MG tablet Take 1 tablet (5 mg total) by mouth 2 (two) times daily. 180 tablet 3   metoprolol succinate (TOPROL-XL) 50 MG 24 hr tablet Take 1 tablet (50 mg total) by mouth daily. Take with or immediately following a meal. 90 tablet 3   mirabegron ER (MYRBETRIQ) 50 MG TB24 tablet Take 1 tablet (50 mg total) by mouth daily. 90 tablet 3   Multiple Vitamins-Minerals (MULTIVITAMIN WITH MINERALS) tablet Take 1  tablet by mouth daily.     ondansetron (ZOFRAN-ODT) 4 MG disintegrating tablet Take 1 tablet (4 mg total) by mouth every 8 (eight) hours as needed for nausea or vomiting. 15 tablet 0   pantoprazole (PROTONIX) 40 MG tablet TAKE 1 TABLET (40 MG TOTAL) BY MOUTH DAILY. 30 MINUTE(S) BEFORE BREAKFAST 90 tablet 3   rosuvastatin (CRESTOR) 10 MG tablet Take 1 tablet (10 mg total) by mouth at bedtime. 90 tablet 3   sodium chloride (OCEAN) 0.65 % SOLN nasal spray Place 2 sprays into both nostrils as needed for congestion. 30 mL 11   spironolactone (ALDACTONE) 25 MG tablet Take 0.5 tablets (12.5 mg total) by mouth daily as needed. In am 45 tablet 3   sucralfate (CARAFATE) 1 g tablet TAKE 1 TABLET BY MOUTH FOUR TIMES A DAY 120 tablet 11   tamsulosin (FLOMAX) 0.4 MG CAPS  capsule TAKE 1 CAPSULE BY MOUTH EVERY EVENING AFTER SUPPER 28 capsule 2   traZODone (DESYREL) 100 MG tablet Take 1 tablet (100 mg total) by mouth at bedtime as needed for sleep. 90 tablet 3   No current facility-administered medications on file prior to visit.     ROS see history of present illness  Objective  Physical Exam Vitals:   05/14/22 1054 05/14/22 1112  BP: 118/70   Pulse: 93   Temp: 99.1 F (37.3 C)   SpO2: (!) 87% 93%    BP Readings from Last 3 Encounters:  05/14/22 118/70  04/08/22 100/60  04/03/22 100/64   Wt Readings from Last 3 Encounters:  05/14/22 268 lb 9.6 oz (121.8 kg)  04/29/22 264 lb (119.7 kg)  04/08/22 264 lb 6.4 oz (119.9 kg)    Physical Exam Constitutional:      General: He is not in acute distress.    Appearance: He is not diaphoretic.  Cardiovascular:     Rate and Rhythm: Normal rate and regular rhythm.     Heart sounds: Normal heart sounds.  Pulmonary:     Effort: Pulmonary effort is normal.     Breath sounds: Normal breath sounds.  Genitourinary:    Comments: Normal uncircumcised penis, normal scrotum, normal testicles Skin:    General: Skin is warm and dry.  Neurological:     Mental Status: He is alert.      Assessment/Plan: Please see individual problem list.  Problem List Items Addressed This Visit     Anxiety and depression (Chronic)    Chronic issue worsened by his recent issues with erectile dysfunction.  He will continue Prozac 20 mg daily and Abilify 10 mg daily.  Discussed that both of those medications could be contributing to his erectile dysfunction and he may need to consider alternatives to those at some point in the future.      COPD (chronic obstructive pulmonary disease) (HCC) (Chronic)    Chronic issue.  Patient is on Breztri at the moment.  He notes no change to his chronic symptoms.  I did encourage him to wear his oxygen at night if he has been advised to do so previously.  His oxygenation in the office  ended up being in the acceptable range after ambulation.  He will monitor for any worsening symptoms.      Erectile dysfunction (Chronic)    Patient is on several medications that could be contributing to this.  He also has many risk factors for vasculogenic cause.  It also appears that he has been treated for prostate cancer in the past.  We  discussed that we do not typically do the alternative treatments in our office.  Discussed penile injection and intraurethral treatment or possibilities that we may need to discuss that with his urologist.  The patient wants to be treated as quickly as possible for this.  He reports he sees his urologist in the next week or 2.  Discussed I would look into the options and check with his urologist to see what the options might be for him.  We will contact him once I hear back from his urologist.        Return if symptoms worsen or fail to improve.   Tommi Rumps, MD [place]

## 2022-05-14 NOTE — Telephone Encounter (Signed)
I called the patient and spoke with him and I informed him that the provider did speak with Dr. Erlene Quan and they will have a discussion about his erectile dysfunction at his next visit and he understood.  I informed him that the provider stated there is not much else he could offer him and he understood.  Monique Hefty,cma

## 2022-05-16 ENCOUNTER — Telehealth: Payer: Self-pay | Admitting: *Deleted

## 2022-05-16 NOTE — Telephone Encounter (Signed)
Tried calling pt to let him know we have changed his appt to [facility] instead of Dr. Erlene Quan, pt is having issues with erections and per Dr.Brandon it would be better for him to see [facility].  ( We switched pt's per Erlene Quan)  Mailbox is full will try to call again

## 2022-05-19 NOTE — Telephone Encounter (Signed)
Spoke with patient and advised results   

## 2022-05-19 NOTE — Progress Notes (Signed)
05/20/2022 9:25 PM   Christopher Orr 11-Mar-1946 [id number]  Referring provider: McLean-Scocuzza, Christopher Glow, MD No address on file  Urological history: 1. ED -contributing factors of age, alcohol consumption, anxiety, depression, COPD on O2 at night, smoker, prediabetes, CAD, heart disease, marijuana, BPH, HLD, HTN, prostate cancer and pelvic radiation -SHIM 5 -failed PDE5i's  2. Prostate cancer -PSA (03/2022) <0.1 -low risk prostate cancer -brachytherapy (2017)   3. Urethral stricture  - Optilume balloon (07/2021)   4. Renal cyst -contrast CT (07/2021) - Multiple bilateral renal cysts.  Chief Complaint  Patient presents with   Hematuria   Prostate Cancer    HPI: Christopher Orr is a 76 y.o. male who presents today for ED and urination issues.    He is having issues with urge incontinence for one year.  He taking Myrbetriq 50 mg daily and tamsulosin 0.4 mg daily.  Patient denies any modifying or aggravating factors.  Patient denies any gross hematuria, dysuria or suprapubic/flank pain.  Patient denies any fevers, chills, nausea or vomiting.    I PSS 6/4  UA yellow clear, SG 1.025, pH 5.5, 2+ protein and moderate bacteria.    PVR 0 mL   IPSS     Row Name 05/20/22 1600         International Prostate Symptom Score   How often have you had the sensation of not emptying your bladder? Not at All     How often have you had to urinate less than every two hours? Not at All     How often have you found you stopped and started again several times when you urinated? Not at All     How often have you found it difficult to postpone urination? More than half the time     How often have you had a weak urinary stream? Not at All     How often have you had to strain to start urination? Not at All     How many times did you typically get up at night to urinate? 2 Times     Total IPSS Score 6       Quality of Life due to urinary symptoms   If you were to spend the rest of  your life with your urinary condition just the way it is now how would you feel about that? Mostly Disatisfied              Score:  1-7 Mild 8-19 Moderate 20-35 Severe   Patient is not having spontaneous erections.  He denies any pain or curvature with erections.  He gets no response with PDE5i's.   SHIM 5   SHIM     Row Name 05/20/22 1621         SHIM: Over the last 6 months:   How do you rate your confidence that you could get and keep an erection? Very Low     When you had erections with sexual stimulation, how often were your erections hard enough for penetration (entering your partner)? Almost Never or Never     During sexual intercourse, how often were you able to maintain your erection after you had penetrated (entered) your partner? Almost Never or Never     During sexual intercourse, how difficult was it to maintain your erection to completion of intercourse? Extremely Difficult     When you attempted sexual intercourse, how often was it satisfactory for you? Almost Never or Never  SHIM Total Score   SHIM 5              Score: 1-7 Severe ED 8-11 Moderate ED 12-16 Mild-Moderate ED 17-21 Mild ED 22-25 No ED    PMH: Past Medical History:  Diagnosis Date   Alcohol abuse    pt states it is marijuana   Anemia    Arthritis    Bipolar disorder (HCC)    BPH (benign prostatic hypertrophy) with urinary obstruction    CAD (coronary artery disease)    a. 2006 PCI/DES to LAD, EF 60%.   Chronic heart failure with preserved ejection fraction (HFpEF) ([place])    a. 07/2014 Echo: EF 55%; b. 11/2020 Echo: EF 50-55%, mod LVH, GrI DD, mildly enlarged RV w/ nl fxn, mild BAE.   Chronic pancreatitis (HCC)    Cirrhosis ([place])    COPD (chronic obstructive pulmonary disease) ([place])    COVID-19    08/22/20   Dementia ([place])    early dementia   Depression    Dilated ascending aorta and aortic root ([place])    a. 11/2020 Echo: Ao root 63mm, Asc Ao 15mm. Ao arch 21mm.    Diverticulitis    12/06/20 [facility]    Diverticulosis    Drug use    Dyspnea    ED (erectile dysfunction)    Gastritis    GERD (gastroesophageal reflux disease)    Headache    occasional migraines   Hepatitis C    treated   History of lumbar fusion    Hyperlipidemia    Hypertension    patient denies having high blood pressure   Lung cancer ([place])    Pancreatitis    x 2    Pneumonia    07/06/18    Premature atrial contractions    a. 04/2021 [facility]: Freq PACs w/ 8% burden.   Prostate cancer ([place])    with seeds   PSVT (paroxysmal supraventricular tachycardia)    a. 04/2021 [facility]: Avg HR 80 (60-210). 42 SVT runs - longest 5 beats @ 116, fastest 210 x 5 beats. Freq PACs (8%).   PTSD (post-traumatic stress disorder)    Rib fracture    s/p fall 03/15/19    Sinus disease    Urge incontinence     Surgical History: Past Surgical History:  Procedure Laterality Date   CARDIAC CATHETERIZATION  2006   cateract  2013   COLONOSCOPY WITH PROPOFOL N/A 10/04/2019   Procedure: COLONOSCOPY WITH PROPOFOL;  Surgeon: Lucilla Lame, MD;  Location: [facility] ENDOSCOPY;  Service: Endoscopy;  Laterality: N/A;   CORONARY ANGIOPLASTY  2006   CORONARY STENT PLACEMENT  2006   x 1   CYSTOSCOPY W/ RETROGRADES N/A 08/08/2021   Procedure: CYSTOSCOPY WITH RETROGRADE PYELOGRAM;  Surgeon: Hollice Espy, MD;  Location: [facility] ORS;  Service: Urology;  Laterality: N/A;   CYSTOSCOPY WITH URETHRAL DILATATION N/A 08/08/2021   Procedure: CYSTOSCOPY WITH URETHRAL DILATATION WITH UROLUME BALLOON;  Surgeon: Hollice Espy, MD;  Location: [facility] ORS;  Service: Urology;  Laterality: N/A;   ESOPHAGOGASTRODUODENOSCOPY N/A 12/22/2014   Procedure: ESOPHAGOGASTRODUODENOSCOPY (EGD);  Surgeon: [name], MD;  Location: [facility] ENDOSCOPY;  Service: Endoscopy;  Laterality: N/A;   ESOPHAGOGASTRODUODENOSCOPY (EGD) WITH PROPOFOL N/A 10/04/2019   Procedure: ESOPHAGOGASTRODUODENOSCOPY (EGD) WITH PROPOFOL;  Surgeon: Lucilla Lame, MD;  Location:  [facility] ENDOSCOPY;  Service: Endoscopy;  Laterality: N/A;   HERNIA REPAIR  2015   left groin   INTERCOSTAL NERVE BLOCK Left 11/09/2019   Procedure: Intercostal Nerve Block;  Surgeon:  Melrose Nakayama, MD;  Location: [place];  Service: Thoracic;  Laterality: Left;   LUMBAR FUSION     RADIOACTIVE SEED IMPLANT N/A 04/22/2016   Procedure: RADIOACTIVE SEED IMPLANT/BRACHYTHERAPY IMPLANT;  Surgeon: Hollice Espy, MD;  Location: [facility] ORS;  Service: Urology;  Laterality: N/A;   ROBOT ASSISTED INGUINAL HERNIA REPAIR Bilateral 07/06/2018   Procedure: ROBOT ASSISTED INGUINAL HERNIA REPAIR;  Surgeon: Jules Husbands, MD;  Location: [facility] ORS;  Service: General;  Laterality: Bilateral;   TONSILLECTOMY     UMBILICAL HERNIA REPAIR N/A 07/06/2018   Procedure: LAPAROSCOPIC ROBOT ASSISTED UMBILICAL HERNIA;  Surgeon: Jules Husbands, MD;  Location: [facility] ORS;  Service: General;  Laterality: N/A;   XI ROBOTIC ASSISTED THORACOSCOPY- SEGMENTECTOMY Left 11/09/2019   Procedure: XI ROBOTIC ASSISTED THORACOSCOPY-WEDGE RESECTION LEFT LOWER LOBE;  Surgeon: Melrose Nakayama, MD;  Location: [place];  Service: Thoracic;  Laterality: Left;    Home Medications:  Allergies as of 05/20/2022   No Known Allergies      Medication List        Accurate as of May 20, 2022  9:25 PM. If you have any questions, ask your nurse or doctor.          albuterol 108 (90 Base) MCG/ACT inhaler Commonly known as: VENTOLIN HFA Inhale 1-2 puffs into the lungs every 6 (six) hours as needed for wheezing or shortness of breath.   albuterol (2.5 MG/3ML) 0.083% nebulizer solution Commonly known as: PROVENTIL Take 3 mLs (2.5 mg total) by nebulization every 6 (six) hours as needed for wheezing or shortness of breath.   ARIPiprazole 10 MG tablet Commonly known as: ABILIFY TAKE 1 TABLET BY MOUTH AT BEDTIME   aspirin EC 81 MG tablet Take 81 mg by mouth daily.   Breztri Aerosphere 160-9-4.8 MCG/ACT Aero Generic drug:  Budeson-Glycopyrrol-Formoterol Inhale 2 puffs into the lungs in the morning and at bedtime.   celecoxib 200 MG capsule Commonly known as: CELEBREX TAKE 1 CAPSULE BY MOUTH DAILY AFTER BREAKFAST   clopidogrel 75 MG tablet Commonly known as: PLAVIX Take 1 tablet (75 mg total) by mouth daily.   donepezil 5 MG tablet Commonly known as: ARICEPT Take 1 tablet (5 mg total) by mouth at bedtime.   FLUoxetine 20 MG capsule Commonly known as: PROZAC Take 1 capsule (20 mg total) by mouth daily.   fluticasone 50 MCG/ACT nasal spray Commonly known as: FLONASE Place 2 sprays into both nostrils daily. Prn after nasal saline   furosemide 20 MG tablet Commonly known as: LASIX Take 0.5 tablets (10 mg total) by mouth daily as needed. In am   levofloxacin 750 MG tablet Commonly known as: Levaquin Take 1 tablet (750 mg total) by mouth daily. With food   memantine 5 MG tablet Commonly known as: NAMENDA Take 1 tablet (5 mg total) by mouth 2 (two) times daily.   metoprolol succinate 50 MG 24 hr tablet Commonly known as: TOPROL-XL Take 1 tablet (50 mg total) by mouth daily. Take with or immediately following a meal.   mirabegron ER 50 MG Tb24 tablet Commonly known as: Myrbetriq Take 1 tablet (50 mg total) by mouth daily.   multivitamin with minerals tablet Take 1 tablet by mouth daily.   ondansetron 4 MG disintegrating tablet Commonly known as: ZOFRAN-ODT Take 1 tablet (4 mg total) by mouth every 8 (eight) hours as needed for nausea or vomiting.   pantoprazole 40 MG tablet Commonly known as: PROTONIX TAKE 1 TABLET (40 MG TOTAL) BY MOUTH DAILY. [place]  MINUTE(S) BEFORE BREAKFAST   rosuvastatin 10 MG tablet Commonly known as: CRESTOR Take 1 tablet (10 mg total) by mouth at bedtime.   sodium chloride 0.65 % Soln nasal spray Commonly known as: OCEAN Place 2 sprays into both nostrils as needed for congestion.   spironolactone 25 MG tablet Commonly known as: ALDACTONE Take 0.5 tablets (12.5  mg total) by mouth daily as needed. In am   sucralfate 1 g tablet Commonly known as: CARAFATE TAKE 1 TABLET BY MOUTH FOUR TIMES A DAY   tamsulosin 0.4 MG Caps capsule Commonly known as: FLOMAX TAKE 1 CAPSULE BY MOUTH EVERY EVENING AFTER SUPPER   traZODone 100 MG tablet Commonly known as: DESYREL Take 1 tablet (100 mg total) by mouth at bedtime as needed for sleep.        Allergies: No Known Allergies  Family History: Family History  Problem Relation Age of Onset   Heart disease Father        Unclear details. Father passed in late 29's 2/2 cancer.   Colon cancer Father    Alcohol abuse Sister    Drug abuse Sister    Anxiety disorder Sister    Depression Sister    COPD Brother    Obesity Brother    Kidney disease Neg Hx    Prostate cancer Neg Hx     Social History:  reports that he has been smoking cigarettes. He started smoking about 59 years ago. He has a 82.50 pack-year smoking history. He has never used smokeless tobacco. He reports current alcohol use of about 1.0 standard drink of alcohol per week. He reports current drug use. Frequency: 7.00 times per week. Drug: Marijuana.  ROS: Pertinent ROS in HPI  Physical Exam: BP 117/73   Pulse 67   Ht 6' (1.829 m)   Wt 265 lb (120.2 kg)   BMI 35.94 kg/m   Constitutional:  Well nourished. Alert and oriented, No acute distress. HEENT: [place] AT, moist mucus membranes.  Trachea midline, no masses. Cardiovascular: No clubbing, cyanosis, or edema. Respiratory: Normal respiratory effort, no increased work of breathing. Neurologic: Grossly intact, no focal deficits, moving all 4 extremities. Psychiatric: Normal mood and affect.  Laboratory Data: Lab Results  Component Value Date   WBC 8.8 12/24/2021   HGB 12.5 (L) 12/24/2021   HCT 36.6 (L) 12/24/2021   MCV 97.3 12/24/2021   PLT 290 12/24/2021    Lab Results  Component Value Date   CREATININE 1.14 04/08/2022    Lab Results  Component Value Date   HGBA1C 6.1  04/08/2022    Lab Results  Component Value Date   AST 19 12/24/2021   Lab Results  Component Value Date   ALT 17 12/24/2021    Urinalysis See Epic and HPI I have reviewed the labs.   Pertinent Imaging:  05/20/22 16:22  Scan Result 34mL   Assessment & Plan:    1. Erectile dysfunction - We discussed intra-urethral suppositories, intracavernous vasoactive drug injection therapy, vacuum erection devices, LI-ESWT and penile prosthesis implantation  - he deferred other options mostly due to cost issues  2. BPH with LUTS -UA benign  -PVR < 300 cc  -most bothersome symptoms are urge incontinence -continue conservative management, avoiding bladder irritants and timed voiding's -discontinue tamsulosin 0.4 mg daily  3. Urge incontinence -many contributing factors (pelvic radiation, smoking, alcohol consumption and diuretics -UA unremarkable -urine is sent for culture and atypical's -stop the Flomax -continue the Myrbetriq 50 mg daily  4. Prostate cancer -has  follow up in 03/2023 w/ Dr. Erlene Quan    Return in about 1 month (around 06/20/2022) for IPSS and PVR.  These notes generated with voice recognition software. I apologize for typographical errors.  [place], [place] [address]  [place], [place] [address] [phone]

## 2022-05-20 ENCOUNTER — Encounter: Payer: Self-pay | Admitting: Urology

## 2022-05-20 ENCOUNTER — Ambulatory Visit: Payer: Medicare HMO | Admitting: Urology

## 2022-05-20 ENCOUNTER — Ambulatory Visit (INDEPENDENT_AMBULATORY_CARE_PROVIDER_SITE_OTHER): Payer: Medicare HMO | Admitting: Family Medicine

## 2022-05-20 VITALS — BP 117/73 | HR 67 | Ht 72.0 in | Wt 265.0 lb

## 2022-05-20 VITALS — BP 132/76 | HR 76 | Temp 97.6°F | Ht 72.0 in | Wt 265.4 lb

## 2022-05-20 DIAGNOSIS — F419 Anxiety disorder, unspecified: Secondary | ICD-10-CM | POA: Diagnosis not present

## 2022-05-20 DIAGNOSIS — I700 Atherosclerosis of aorta: Secondary | ICD-10-CM

## 2022-05-20 DIAGNOSIS — N529 Male erectile dysfunction, unspecified: Secondary | ICD-10-CM

## 2022-05-20 DIAGNOSIS — E785 Hyperlipidemia, unspecified: Secondary | ICD-10-CM

## 2022-05-20 DIAGNOSIS — G301 Alzheimer's disease with late onset: Secondary | ICD-10-CM

## 2022-05-20 DIAGNOSIS — F32A Depression, unspecified: Secondary | ICD-10-CM

## 2022-05-20 DIAGNOSIS — F339 Major depressive disorder, recurrent, unspecified: Secondary | ICD-10-CM

## 2022-05-20 DIAGNOSIS — F1010 Alcohol abuse, uncomplicated: Secondary | ICD-10-CM | POA: Diagnosis not present

## 2022-05-20 DIAGNOSIS — R413 Other amnesia: Secondary | ICD-10-CM | POA: Diagnosis not present

## 2022-05-20 DIAGNOSIS — F0280 Dementia in other diseases classified elsewhere without behavioral disturbance: Secondary | ICD-10-CM

## 2022-05-20 DIAGNOSIS — R3129 Other microscopic hematuria: Secondary | ICD-10-CM | POA: Diagnosis not present

## 2022-05-20 DIAGNOSIS — I471 Supraventricular tachycardia, unspecified: Secondary | ICD-10-CM

## 2022-05-20 DIAGNOSIS — N401 Enlarged prostate with lower urinary tract symptoms: Secondary | ICD-10-CM

## 2022-05-20 DIAGNOSIS — N3941 Urge incontinence: Secondary | ICD-10-CM | POA: Diagnosis not present

## 2022-05-20 LAB — BLADDER SCAN AMB NON-IMAGING

## 2022-05-20 NOTE — Patient Instructions (Signed)
It was a pleasure meeting you today. Thank you for allowing me to take part in your health care.  Our goals for today as we discussed include:  Please return to clinic in 1 week and bring all of your medications with you.  For you blood pressure Your blood pressure was a little elevated.   Will recheck at next visit  Will look into home aid if possible.   Please follow-up with PCP in 1 week  If you have any questions or concerns, please do not hesitate to call the office at [phone].  I look forward to our next visit and until then take care and stay safe.  Regards,   Carollee Leitz, MD   [facility]

## 2022-05-20 NOTE — Progress Notes (Signed)
SUBJECTIVE:   CHIEF COMPLAINT / HPI: transfer of care  Patient presents to clinic to transfer care  No acute concerns today  Decline in memory Taking Nemanda and Aricept.  Misses medications 1-2 times a months.  Reports has gotten lost while driving a few times.  Lives alone.  Eats 2 meals a day, mostly fast food. Oriented to self, month and place.    Etoh use Drinks 6-8 beer daily.  Tobacco use Pack daily.  No interest in quitting.  Mood disorder Follows with therapist online.  Dr Mable Fill [phone]).  Reports visits 2-3 times a month.  Currently taking Prozac 20 mg and tolerating well.  Patient unsure what other medications he is taking.   PERTINENT  PMH / PSH:  CAD s/p stent on Plavix HLD Tobacco use Etoh use COPD Mild Cognitive Impairment  OBJECTIVE:   BP 132/76 (BP Location: Left Arm, Patient Position: Sitting, Cuff Size: Large)   Pulse 76   Temp 97.6 F (36.4 C) (Oral)   Ht 6' (1.829 m)   Wt 265 lb 6.4 oz (120.4 kg)   SpO2 95%   BMI 35.99 kg/m    General: Alert, no acute distress Cardio: Normal S1 and S2, RRR, no r/m/g Pulm: CTAB, normal work of breathing Abdomen: Bowel sounds normal. Abdomen soft and non-tender.  Extremities: No peripheral edema.  Neuro: Cranial nerves grossly intact    05/29/2022   10:03 AM 05/27/2022   12:36 PM 05/20/2022    1:32 PM 05/20/2022   11:20 AM 04/08/2022    1:15 PM  Depression screen PHQ 2/9  Decreased Interest 1 2 1 1 2   Down, Depressed, Hopeless 0 0 1 1 1   PHQ - 2 Score 1 2 2 2 3   Altered sleeping  0 0  0  Tired, decreased energy  3 3  3   Change in appetite  2 2  2   Feeling bad or failure about yourself   0 1  2  Trouble concentrating  2 2  3   Moving slowly or fidgety/restless  0 0  0  Suicidal thoughts  0 0  0  PHQ-9 Score  9 10  13   Difficult doing work/chores  Somewhat difficult   Somewhat difficult     ASSESSMENT/PLAN:   Atherosclerosis of aorta (HCC) Noted on CT chest from 11/2021 On  statin therapy and tolerating well -Continue Crestor 10 mg daily  Hyperlipidemia On statin therapy and tolerating well.  No myalgias.  Reviewed recent LDL <25.  -Continue Crestor 10 mg daly  Anxiety and depression Chronic, stable.  Reports compliant with medications most of the time. Review of MAR, currently prescribed Prozac 20 mg, Abilify 10 mg, Trazodone 100 mg,  but he is not sure if this is the dose.  Denies any SI/HI.  Risk factors include male gender, lives alone, ETOH use, cognitive impairment.  -Continue Prozac 20 mg daily -Continue to follow up with Dr Mable Fill [facility] online as scheduled.    Late onset Alzheimer's disease without behavioral disturbance ([place]) Patient is new to me and poor historian. Chronic, stable. Patient is currently taking  Aricept 5 mg daily and Namanda 5 mg BID.  Reviewed previous notes from Dr Manuella Ghazi, Neurology at [facility], 02/01/2021.  Prescribed medications were Aricept 5 mg daily and Namenda 10 mg BID. Was concern at that time for patient living by himself.  A home health referral was placed.  It was also recommended that patient and daughter follow up for Dementia Care planning.  I cannot find any records of this in Care Everywhere. -Continue current medications.  Will have patient return with all meds for review.  -I am concerned that he is still living on his own, misses pills, poor nutrition, ETOH and tobacco use.  Frequently forgets where he is while driving.  -Follow up in 1 week, plan to discuss [facility] consult for medication management, home health  -Recommend he follow up with Neurology  Alcohol Abuse Drinks 6-8 beer daily Not interested in cessation Continue to encourage decrease alcohol intake  Tobacco Use Encourage smoking cessation   HCM Declined Tetanus Declined AWV  PDMP Reviewed  Carollee Leitz, MD

## 2022-05-21 LAB — URINALYSIS, COMPLETE
Bilirubin, UA: NEGATIVE
Glucose, UA: NEGATIVE
Ketones, UA: NEGATIVE
Leukocytes,UA: NEGATIVE
Nitrite, UA: NEGATIVE
RBC, UA: NEGATIVE
Specific Gravity, UA: 1.025 (ref 1.005–1.030)
Urobilinogen, Ur: 0.2 mg/dL (ref 0.2–1.0)
pH, UA: 5.5 (ref 5.0–7.5)

## 2022-05-21 LAB — MICROSCOPIC EXAMINATION

## 2022-05-25 DIAGNOSIS — I700 Atherosclerosis of aorta: Secondary | ICD-10-CM | POA: Insufficient documentation

## 2022-05-25 NOTE — Assessment & Plan Note (Signed)
Noted on CT chest from 11/2021 On statin therapy and tolerating well Continue Crestor 10 mg daily

## 2022-05-26 ENCOUNTER — Telehealth: Payer: Self-pay

## 2022-05-26 NOTE — Telephone Encounter (Signed)
error 

## 2022-05-27 ENCOUNTER — Encounter: Payer: Self-pay | Admitting: Family Medicine

## 2022-05-27 ENCOUNTER — Ambulatory Visit (INDEPENDENT_AMBULATORY_CARE_PROVIDER_SITE_OTHER): Payer: Medicare HMO | Admitting: Family Medicine

## 2022-05-27 VITALS — BP 100/70 | HR 80 | Temp 98.0°F | Resp 14 | Ht 72.0 in | Wt 268.4 lb

## 2022-05-27 DIAGNOSIS — D539 Nutritional anemia, unspecified: Secondary | ICD-10-CM

## 2022-05-27 DIAGNOSIS — I25119 Atherosclerotic heart disease of native coronary artery with unspecified angina pectoris: Secondary | ICD-10-CM

## 2022-05-27 DIAGNOSIS — F339 Major depressive disorder, recurrent, unspecified: Secondary | ICD-10-CM | POA: Diagnosis not present

## 2022-05-27 DIAGNOSIS — J449 Chronic obstructive pulmonary disease, unspecified: Secondary | ICD-10-CM | POA: Diagnosis not present

## 2022-05-27 DIAGNOSIS — G301 Alzheimer's disease with late onset: Secondary | ICD-10-CM | POA: Diagnosis not present

## 2022-05-27 DIAGNOSIS — Z720 Tobacco use: Secondary | ICD-10-CM

## 2022-05-27 DIAGNOSIS — D509 Iron deficiency anemia, unspecified: Secondary | ICD-10-CM | POA: Diagnosis not present

## 2022-05-27 DIAGNOSIS — F1010 Alcohol abuse, uncomplicated: Secondary | ICD-10-CM

## 2022-05-27 DIAGNOSIS — K7460 Unspecified cirrhosis of liver: Secondary | ICD-10-CM

## 2022-05-27 DIAGNOSIS — R4189 Other symptoms and signs involving cognitive functions and awareness: Secondary | ICD-10-CM

## 2022-05-27 DIAGNOSIS — F419 Anxiety disorder, unspecified: Secondary | ICD-10-CM

## 2022-05-27 DIAGNOSIS — E639 Nutritional deficiency, unspecified: Secondary | ICD-10-CM

## 2022-05-27 DIAGNOSIS — F32A Depression, unspecified: Secondary | ICD-10-CM

## 2022-05-27 DIAGNOSIS — N1831 Chronic kidney disease, stage 3a: Secondary | ICD-10-CM | POA: Diagnosis not present

## 2022-05-27 DIAGNOSIS — I2510 Atherosclerotic heart disease of native coronary artery without angina pectoris: Secondary | ICD-10-CM

## 2022-05-27 DIAGNOSIS — Z85118 Personal history of other malignant neoplasm of bronchus and lung: Secondary | ICD-10-CM | POA: Diagnosis not present

## 2022-05-27 DIAGNOSIS — F0280 Dementia in other diseases classified elsewhere without behavioral disturbance: Secondary | ICD-10-CM

## 2022-05-27 LAB — CBC WITH DIFFERENTIAL/PLATELET
Basophils Absolute: 0.1 10*3/uL (ref 0.0–0.1)
Basophils Relative: 0.7 % (ref 0.0–3.0)
Eosinophils Absolute: 0.2 10*3/uL (ref 0.0–0.7)
Eosinophils Relative: 2.9 % (ref 0.0–5.0)
HCT: 39.1 % (ref 39.0–52.0)
Hemoglobin: 12.9 g/dL — ABNORMAL LOW (ref 13.0–17.0)
Lymphocytes Relative: 19.7 % (ref 12.0–46.0)
Lymphs Abs: 1.5 10*3/uL (ref 0.7–4.0)
MCHC: 33 g/dL (ref 30.0–36.0)
MCV: 100.3 fl — ABNORMAL HIGH (ref 78.0–100.0)
Monocytes Absolute: 0.7 10*3/uL (ref 0.1–1.0)
Monocytes Relative: 8.9 % (ref 3.0–12.0)
Neutro Abs: 5.2 10*3/uL (ref 1.4–7.7)
Neutrophils Relative %: 67.8 % (ref 43.0–77.0)
Platelets: 278 10*3/uL (ref 150.0–400.0)
RBC: 3.9 Mil/uL — ABNORMAL LOW (ref 4.22–5.81)
RDW: 15.1 % (ref 11.5–15.5)
WBC: 7.6 10*3/uL (ref 4.0–10.5)

## 2022-05-27 LAB — COMPREHENSIVE METABOLIC PANEL
ALT: 12 U/L (ref 0–53)
AST: 13 U/L (ref 0–37)
Albumin: 4.2 g/dL (ref 3.5–5.2)
Alkaline Phosphatase: 85 U/L (ref 39–117)
BUN: 19 mg/dL (ref 6–23)
CO2: 28 mEq/L (ref 19–32)
Calcium: 9.6 mg/dL (ref 8.4–10.5)
Chloride: 106 mEq/L (ref 96–112)
Creatinine, Ser: 1.15 mg/dL (ref 0.40–1.50)
GFR: 62.13 mL/min (ref >60.00)
Glucose, Bld: 86 mg/dL (ref 70–99)
Potassium: 4.6 mEq/L (ref 3.5–5.1)
Sodium: 141 mEq/L (ref 135–145)
Total Bilirubin: 0.2 mg/dL (ref 0.2–1.2)
Total Protein: 7 g/dL (ref 6.0–8.3)

## 2022-05-27 LAB — VITAMIN D 25 HYDROXY (VIT D DEFICIENCY, FRACTURES): VITD: 35.77 ng/mL (ref 30.00–100.00)

## 2022-05-27 LAB — VITAMIN B12: Vitamin B-12: 299 pg/mL (ref 211–911)

## 2022-05-27 LAB — TSH: TSH: 0.48 u[IU]/mL (ref 0.35–5.50)

## 2022-05-27 NOTE — Assessment & Plan Note (Signed)
Chronic, stable.  Reports compliant with medications most of the time. Review of MAR, currently prescribed Prozac 20 mg, Abilify 10 mg, Trazodone 100 mg,  but he is not sure if this is the dose.  Denies any SI/HI.  Risk factors include male gender, lives alone, ETOH use, cognitive impairment.  -Continue Prozac 20 mg daily -Continue to follow up with Dr Mable Fill [facility] online as scheduled.

## 2022-05-27 NOTE — Patient Instructions (Addendum)
It was a pleasure meeting you today. Thank you for allowing me to take part in your health care.  Our goals for today as we discussed include:  For your blood pressure is a little low today We will get some labs today.  If they are abnormal or we need to do something about them, I will call you.  If they are normal, I will send you a message on MyChart (if it is active) or a letter in the mail.  If you don't hear from [place] in 2 weeks, please call the office at the number below.   REMINDER  BRING IN ALL YOUR MEDICATIONS WITH YOU AT THIS VISIT  Recommend Shingles vaccine.  This is a 2 dose series and can be given at your local pharmacy.  Please talk to your pharmacist about this.   Recommend for RSV vaccine. This can be given at your pharmacy.  Please follow-up with PCP in 2 days  If you have any questions or concerns, please do not hesitate to call the office at [phone].  I look forward to our next visit and until then take care and stay safe.  Regards,   Carollee Leitz, MD   [facility]

## 2022-05-27 NOTE — Assessment & Plan Note (Signed)
Patient is new to me and poor historian. Chronic, stable. Patient is currently taking  Aricept 5 mg daily and Namanda 5 mg BID.  Reviewed previous notes from Dr Manuella Ghazi, Neurology at [facility], 02/01/2021.  Prescribed medications were Aricept 5 mg daily and Namenda 10 mg BID. Was concern at that time for patient living by himself.  A home health referral was placed.  It was also recommended that patient and daughter follow up for Dementia Care planning.  I cannot find any records of this in Care Everywhere. -Continue current medications.  Will have patient return with all meds for review.  -I am concerned that he is still living on his own, misses pills, poor nutrition, ETOH and tobacco use.  Frequently forgets where he is while driving.  -Follow up in 1 week, plan to discuss [facility] consult for medication management, home health  -Recommend he follow up with Neurology

## 2022-05-27 NOTE — Progress Notes (Signed)
    SUBJECTIVE:   CHIEF COMPLAINT / HPI: follow up medication   Patient presents to clinic for follow up HTN and review of medications  No acute concerns  Did not bring in medications today for review.    CAD Asymptomatic.  Denies any chest pain, shortness of breath or lower extremity swelling.  Reports taking blood pressure medications that he sent to him in a blister pack.  Not sure what medications or dosages.  Dementia Has not followed up with neurology for dementia planning.   Mood disorder No change in mood.  Continues to follow-up with psychiatry online.  Takes trazodone to help with sleep.  Not aware of any other medications taking for mood.   PERTINENT  PMH / PSH:  Dementia HTN   OBJECTIVE:   BP 100/70 (BP Location: Left Arm, Patient Position: Sitting, Cuff Size: Large)   Pulse 80   Temp 98 F (36.7 C) (Oral)   Resp 14   Ht 6' (1.829 m)   Wt 268 lb 6.4 oz (121.7 kg)   SpO2 94%   BMI 36.40 kg/m    General: Alert, no acute distress Cardio: Normal S1 and S2, RRR, no r/m/g Pulm: CTAB, normal work of breathing Extremities: No peripheral edema.    ASSESSMENT/PLAN:   Late onset Alzheimer's disease without behavioral disturbance (HCC) Chronic, stable. Patient is currently taking  Aricept 5 mg daily and Namanda 5 mg BID.  Reviewed previous notes from Dr Manuella Ghazi, Neurology at [facility], 02/01/2021.  Prescribed medications were Aricept 5 mg daily and Namenda 10 mg BID.  Abilify was discontinued at this time.  Patient does not bring in medications as requested. -Continue current medications -Follow-up in 2 days -Recommend to schedule appointment to follow-up with neurology  Anxiety and depression Reviewed notes from last neurology visit.  Abilify was discontinued at that time.  Patient continues to take medications as prescribed. -Continue to follow-up with online psychiatry -Continue Prozac ordered by PCP -We will have [place] reach out to patient for  medication review.  Cirrhosis of liver (HCC) Chronic, stable.  Reviewed last RUQ u/s with  Electrography from 2016 suggesting F4 and F3 score indicating high risk for fibrosis.  No ascites present.  Repeat u/s 11/2015 showing early cirrhosis without ascites. I could not find any previous Paracentesis in EPIC or Care Everywhere. He is currently prescribed Lasix 20 mg and Spironolactone 12.5 mg.  Patient is unsure if he is taking this medication. -Follow-up in 2 days to review meds  Poor nutrition Patient reports eating 1 meal at home daily.  Mostly eats takeout food.  Increase EtOH consumption and tobacco use. -CMET, CBC, iron studies, TSH, B12, vitamin D, folate today -Follow-up with labs  CAD (coronary artery disease) Chronic.  Stable Followed by cardiology.  Recommended lifelong Plavix status post stent. Continue Plavix 75 g daily Continue metoprolol XL 50 mg daily Follow-up with cardiology as scheduled.     Carollee Leitz, MD

## 2022-05-27 NOTE — Assessment & Plan Note (Signed)
On statin therapy and tolerating well.  No myalgias.  Reviewed recent LDL <25.  -Continue Crestor 10 mg daly

## 2022-05-27 NOTE — Assessment & Plan Note (Deleted)
Patient is new to me and poor historian. Chronic, stable. Patient is currently taking  Aricept 5 mg daily and Namanda 5 mg BID.  Reviewed previous notes from Dr Manuella Ghazi, Neurology at [facility], 02/01/2021.  Prescribed medications were Aricept 5 mg daily and Namenda 10 mg BID. Was concern at that time for patient living by himself.  A home health referral was placed.  It was also recommended that patient and daughter follow up for Dementia Care planning.  I cannot find any records of this in Care Everywhere. -Continue current medications.  Will have patient return with all meds for review.  -I am concerned that he is still living on his own, misses pills, poor nutrition, ETOH and tobacco use.  Frequently forgets where he is while driving.  -Follow up in 1 week, plan to discuss [facility] consult for medication management, home health  -Recommend he follow up with Neurology

## 2022-05-28 ENCOUNTER — Telehealth: Payer: Self-pay | Admitting: *Deleted

## 2022-05-28 NOTE — Chronic Care Management (AMB) (Signed)
  Care Coordination   Note   05/28/2022 Name: Christopher Orr MRN: [id number] DOB: 1946/01/14  Christopher Orr is a 76 y.o. year old male who sees McLean-Scocuzza, [name], MD for primary care. I reached out to [name] by phone today to offer care coordination services.  Mr. Ortman was given information about Care Coordination services today including:   The Care Coordination services include support from the care team which includes your Nurse Coordinator, Clinical Social Worker, or Pharmacist.  The Care Coordination team is here to help remove barriers to the health concerns and goals most important to you. Care Coordination services are voluntary, and the patient may decline or stop services at any time by request to their care team member.   Care Coordination Consent Status: Patient agreed to services and verbal consent obtained.   Follow up plan:  Telephone appointment with care coordination team member scheduled for:  05/30/2022  Encounter Outcome:  Pt. Scheduled from referral   Julian Hy, [place] Direct Dial: [phone]

## 2022-05-29 ENCOUNTER — Encounter: Payer: Self-pay | Admitting: Family Medicine

## 2022-05-29 ENCOUNTER — Ambulatory Visit (INDEPENDENT_AMBULATORY_CARE_PROVIDER_SITE_OTHER): Payer: Medicare HMO | Admitting: Family Medicine

## 2022-05-29 VITALS — BP 110/68 | HR 81 | Temp 97.9°F | Ht 72.0 in | Wt 269.4 lb

## 2022-05-29 DIAGNOSIS — I2510 Atherosclerotic heart disease of native coronary artery without angina pectoris: Secondary | ICD-10-CM

## 2022-05-29 DIAGNOSIS — K7460 Unspecified cirrhosis of liver: Secondary | ICD-10-CM | POA: Diagnosis not present

## 2022-05-29 DIAGNOSIS — G301 Alzheimer's disease with late onset: Secondary | ICD-10-CM | POA: Diagnosis not present

## 2022-05-29 DIAGNOSIS — F0280 Dementia in other diseases classified elsewhere without behavioral disturbance: Secondary | ICD-10-CM | POA: Diagnosis not present

## 2022-05-29 NOTE — Patient Instructions (Addendum)
It was a pleasure meeting you today. Thank you for allowing me to take part in your health care.  Our goals for today as we discussed include:  Your Urologist, Dr Ernestine Conrad discontinued the Flomax (Tamsulosin) Stop taking the big two tone brown capsule Continue Myrbetriq 50 mg daily to help with urinary urgency.  For your Mood Continue Prozac 50 mg, the orange and green capsule Continue Abilify 10 mg daily   For your Memory Continue Namenda 5 mg twice a day Continue Aricept 5 mg daily Recommend you schedule a follow up appointment with Dr Manuella Ghazi, your Neurologist to see if needing to increase any medications   Follow up with the pharmacy team tomorrow.  You have a scheduled appointment with the Social Worker at 9 am You have a scheduled appointment with the Pharmacist at 10 am  The Ct chest is scheduled for Nov 27 at 1130 am at [facility]  Please follow-up with PCP in 4 weeks  If you have any questions or concerns, please do not hesitate to call the office at [phone].  I look forward to our next visit and until then take care and stay safe.  Regards,   Carollee Leitz, MD   [facility]

## 2022-05-29 NOTE — Progress Notes (Signed)
    SUBJECTIVE:   CHIEF COMPLAINT / HPI: Follow-up medication management  Patient returns to clinic with medications as requested.  No acute concerns.  Medications provided in blister packs.  PERTINENT  PMH / PSH:  Dementia CAD status post stent on anticoagulation Cirrhosis of liver  OBJECTIVE:   BP 110/68 (BP Location: Left Arm, Patient Position: Sitting, Cuff Size: Large)   Pulse 81   Temp 97.9 F (36.6 C) (Oral)   Ht 6' (1.829 m)   Wt 269 lb 6.4 oz (122.2 kg)   SpO2 98%   BMI 36.54 kg/m    General: Alert, no acute distress Cardio: Normal S1 and S2, RRR, no r/m/g Pulm: CTAB, normal work of breathing  ASSESSMENT/PLAN:   CAD (coronary artery disease) Chronic.  Stable Reviewed medications Continue ASA 81 mg daily Continue Plavix 75 mg daily Continue metoprolol 50 mg daily Follow-up with cardiology as scheduled.  Late onset Alzheimer's disease without behavioral disturbance (HCC) Chronic.  Stable. Current medications include Aricept 5 mg daily, Namenda 5 mg twice daily.   Per neurology notes patient was prescribed Namenda 10 mg twice daily, Aricept 5 mg daily and Abilify was discontinued.  Has not followed up with neurology Recommend follow-up with neurology, Dr. Manuella Ghazi for continued dementia planning  Cirrhosis of liver ([place]) Chronic.  Stable.  Reviewed medications.  Not currently on spironolactone or Lasix. Previously seen by GI.  Upper GI and lower GI completed 2021. We will continue to monitor.   TOC referral to help with management of medications. Highly recommend following up with neurology for dementia planning.   Carollee Leitz, MD

## 2022-05-29 NOTE — Telephone Encounter (Signed)
Called Patient to remind him to bring his medications.

## 2022-05-29 NOTE — Assessment & Plan Note (Signed)
Chronic, stable.  Reviewed last RUQ u/s with  Electrography from 2016 suggesting F4 and F3 score indicating high risk for fibrosis.  No ascites present.  Repeat u/s 11/2015 showing early cirrhosis without ascites. I could not find any previous Paracentesis in EPIC or Care Everywhere. He is currently on Lasix 20 mg and Spironolactone 12.5 mg.  Current recommendation is Aldactone:Lasix at 4:1 ratio dosing if ascites present. -Given no previous ascites

## 2022-05-30 ENCOUNTER — Ambulatory Visit: Payer: Self-pay

## 2022-05-30 ENCOUNTER — Ambulatory Visit: Payer: Self-pay | Admitting: *Deleted

## 2022-05-30 NOTE — Patient Outreach (Signed)
  Care Coordination   05/30/2022 Name: Christopher Orr MRN: [id number] DOB: [birth year]   Care Coordination Outreach Attempts:  An unsuccessful telephone outreach was attempted for a scheduled appointment today.  Follow Up Plan:  Additional outreach attempts will be made to offer the patient care coordination information and services.   Encounter Outcome:  No Answer  Care Coordination Interventions Activated:  No   Care Coordination Interventions:  No, not indicated    Tyeasha Ebbs, [place] Worker  [facility] Care Management [phone]

## 2022-05-30 NOTE — Patient Outreach (Signed)
  Care Coordination   05/30/2022 Name: Christopher Orr MRN: [id number] DOB: 07-09-1946   Care Coordination Outreach Attempts:  An unsuccessful telephone outreach was attempted for a scheduled appointment today.  Follow Up Plan:  Additional outreach attempts will be made to offer the patient care coordination information and services.   Encounter Outcome:  No Answer  Care Coordination Interventions Activated:  No   Care Coordination Interventions:  No, not indicated    Quinn Plowman RN,BSN,CCM [place] [phone] direct line

## 2022-05-31 ENCOUNTER — Encounter: Payer: Self-pay | Admitting: Family Medicine

## 2022-05-31 NOTE — Assessment & Plan Note (Signed)
Drinks 6-8 beer daily Not interested in cessation Continue to encourage decrease alcohol intake

## 2022-06-01 LAB — IRON,TIBC AND FERRITIN PANEL
%SAT: 15 % (calc) — ABNORMAL LOW (ref 20–48)
Ferritin: 36 ng/mL (ref 24–380)
Iron: 48 ug/dL — ABNORMAL LOW (ref 50–180)
TIBC: 325 mcg/dL (calc) (ref 250–425)

## 2022-06-01 LAB — VITAMIN B1: Vitamin B1 (Thiamine): 10 nmol/L (ref 8–30)

## 2022-06-05 ENCOUNTER — Encounter: Payer: Self-pay | Admitting: Family Medicine

## 2022-06-09 ENCOUNTER — Encounter: Payer: Self-pay | Admitting: Family Medicine

## 2022-06-09 DIAGNOSIS — E639 Nutritional deficiency, unspecified: Secondary | ICD-10-CM

## 2022-06-09 HISTORY — DX: Nutritional deficiency, unspecified: E63.9

## 2022-06-09 NOTE — Assessment & Plan Note (Signed)
Reviewed notes from last neurology visit.  Abilify was discontinued at that time.  Patient continues to take medications as prescribed. -Continue to follow-up with online psychiatry -Continue Prozac ordered by PCP -We will have [place] reach out to patient for medication review.

## 2022-06-09 NOTE — Assessment & Plan Note (Signed)
Chronic.  Stable.  Reviewed medications.  Not currently on spironolactone or Lasix. Previously seen by GI.  Upper GI and lower GI completed 2021. We will continue to monitor.

## 2022-06-09 NOTE — Assessment & Plan Note (Signed)
Chronic, stable. Patient is currently taking  Aricept 5 mg daily and Namanda 5 mg BID.  Reviewed previous notes from Dr Manuella Ghazi, Neurology at [facility], 02/01/2021.  Prescribed medications were Aricept 5 mg daily and Namenda 10 mg BID.  Abilify was discontinued at this time.  Patient does not bring in medications as requested. -Continue current medications -Follow-up in 2 days -Recommend to schedule appointment to follow-up with neurology

## 2022-06-09 NOTE — Assessment & Plan Note (Signed)
Chronic, stable.  Reviewed last RUQ u/s with  Electrography from 2016 suggesting F4 and F3 score indicating high risk for fibrosis.  No ascites present.  Repeat u/s 11/2015 showing early cirrhosis without ascites. I could not find any previous Paracentesis in EPIC or Care Everywhere. He is currently prescribed Lasix 20 mg and Spironolactone 12.5 mg.  Patient is unsure if he is taking this medication. -Follow-up in 2 days to review meds

## 2022-06-09 NOTE — Assessment & Plan Note (Signed)
Patient reports eating 1 meal at home daily.  Mostly eats takeout food.  Increase EtOH consumption and tobacco use. -CMET, CBC, iron studies, TSH, B12, vitamin D, folate today -Follow-up with labs

## 2022-06-09 NOTE — Assessment & Plan Note (Signed)
Chronic.  Stable Followed by cardiology.  Recommended lifelong Plavix status post stent. Continue Plavix 75 g daily Continue metoprolol XL 50 mg daily Follow-up with cardiology as scheduled.

## 2022-06-09 NOTE — Assessment & Plan Note (Addendum)
Chronic.  Stable Reviewed medications Continue ASA 81 mg daily Continue Plavix 75 mg daily Continue metoprolol 50 mg daily Follow-up with cardiology as scheduled.

## 2022-06-09 NOTE — Assessment & Plan Note (Signed)
Chronic.  Stable. Current medications include Aricept 5 mg daily, Namenda 5 mg twice daily.   Per neurology notes patient was prescribed Namenda 10 mg twice daily, Aricept 5 mg daily and Abilify was discontinued.  Has not followed up with neurology Recommend follow-up with neurology, Dr. Manuella Ghazi for continued dementia planning

## 2022-06-11 ENCOUNTER — Ambulatory Visit: Payer: Medicare HMO

## 2022-06-12 ENCOUNTER — Ambulatory Visit: Payer: Medicare HMO

## 2022-06-16 ENCOUNTER — Ambulatory Visit: Payer: Medicare HMO | Attending: Otolaryngology

## 2022-06-16 ENCOUNTER — Ambulatory Visit: Payer: Self-pay

## 2022-06-16 DIAGNOSIS — J449 Chronic obstructive pulmonary disease, unspecified: Secondary | ICD-10-CM | POA: Insufficient documentation

## 2022-06-16 DIAGNOSIS — G4731 Primary central sleep apnea: Secondary | ICD-10-CM | POA: Insufficient documentation

## 2022-06-16 DIAGNOSIS — G4730 Sleep apnea, unspecified: Secondary | ICD-10-CM | POA: Diagnosis present

## 2022-06-16 NOTE — Patient Outreach (Signed)
  Care Coordination   06/16/2022 Name: KELLER BOUNDS MRN: [id number] DOB: 1946/07/12   Care Coordination Outreach Attempts:  An unsuccessful telephone outreach was attempted for a scheduled appointment today.Unable to reach patient or leave voice message due to mailbox being full.   Follow Up Plan:  Additional outreach attempts will be made to offer the patient care coordination information and services.   Encounter Outcome:  No Answer  Care Coordination Interventions Activated:  No   Care Coordination Interventions:  No, not indicated    Quinn Plowman RN,BSN,CCM [place] [phone] direct line

## 2022-06-17 ENCOUNTER — Telehealth (INDEPENDENT_AMBULATORY_CARE_PROVIDER_SITE_OTHER): Payer: Medicare HMO | Admitting: Family Medicine

## 2022-06-17 ENCOUNTER — Encounter: Payer: Self-pay | Admitting: Family Medicine

## 2022-06-17 ENCOUNTER — Encounter: Payer: Self-pay | Admitting: *Deleted

## 2022-06-17 VITALS — Ht 72.0 in | Wt 269.0 lb

## 2022-06-17 DIAGNOSIS — G4731 Primary central sleep apnea: Secondary | ICD-10-CM | POA: Diagnosis not present

## 2022-06-17 DIAGNOSIS — N529 Male erectile dysfunction, unspecified: Secondary | ICD-10-CM | POA: Diagnosis not present

## 2022-06-17 MED ORDER — TADALAFIL 5 MG PO TABS: 5.0000 mg | ORAL_TABLET | Freq: Every day | ORAL | 0 refills | Status: DC | PRN

## 2022-06-17 NOTE — Progress Notes (Signed)
Virtual Visit via Video note  I connected with Christopher Orr on 07/05/22 at 1530 by video and verified that I am speaking with the correct person using two identifiers. Christopher Orr is currently located at home and during visit. The provider, Carollee Leitz, MD is located in their office at time of visit.  I discussed the limitations, risks, security and privacy concerns of performing an evaluation and management service by video and the availability of in person appointments. I also discussed with the patient that there may be a patient responsible charge related to this service. The patient expressed understanding and agreed to proceed.  Subjective: PCP: Carollee Leitz, MD  Chief Complaint  Patient presents with   Medication Problem    cialis   HPI Requesting prescription for Cialis.  Someone gave him medication a couple of weeks ago and worked.   Reports no morning erections.  Does have sexual desire.  Denies any chest pain, shortness of breath or use of nitrates.  Has history of ED and previously was treated with Viagra and sildenafil.  At that time medications did not work.  Would like to retry medications today.  ROS: Per HPI  Current Outpatient Medications:    albuterol (PROVENTIL) (2.5 MG/3ML) 0.083% nebulizer solution, Take 3 mLs (2.5 mg total) by nebulization every 6 (six) hours as needed for wheezing or shortness of breath., Disp: 375 mL, Rfl: 3   albuterol (VENTOLIN HFA) 108 (90 Base) MCG/ACT inhaler, Inhale 1-2 puffs into the lungs every 6 (six) hours as needed for wheezing or shortness of breath., Disp: 18 g, Rfl: 12   ARIPiprazole (ABILIFY) 10 MG tablet, TAKE 1 TABLET BY MOUTH AT BEDTIME, Disp: 90 tablet, Rfl: 3   aspirin EC 81 MG tablet, Take 81 mg by mouth daily., Disp: , Rfl:    celecoxib (CELEBREX) 200 MG capsule, TAKE 1 CAPSULE BY MOUTH DAILY AFTER BREAKFAST, Disp: 28 capsule, Rfl: 3   clopidogrel (PLAVIX) 75 MG tablet, Take 1 tablet (75 mg total) by mouth daily.,  Disp: 90 tablet, Rfl: 3   donepezil (ARICEPT) 5 MG tablet, Take 1 tablet (5 mg total) by mouth at bedtime., Disp: 90 tablet, Rfl: 3   fluticasone (FLONASE) 50 MCG/ACT nasal spray, Place 2 sprays into both nostrils daily. Prn after nasal saline, Disp: 16 g, Rfl: 11   metoprolol succinate (TOPROL-XL) 50 MG 24 hr tablet, Take 1 tablet (50 mg total) by mouth daily. Take with or immediately following a meal., Disp: 90 tablet, Rfl: 3   mirabegron ER (MYRBETRIQ) 50 MG TB24 tablet, Take 1 tablet (50 mg total) by mouth daily., Disp: 90 tablet, Rfl: 3   Multiple Vitamins-Minerals (MULTIVITAMIN WITH MINERALS) tablet, Take 1 tablet by mouth daily., Disp: , Rfl:    pantoprazole (PROTONIX) 40 MG tablet, TAKE 1 TABLET (40 MG TOTAL) BY MOUTH DAILY. 30 MINUTE(S) BEFORE BREAKFAST, Disp: 90 tablet, Rfl: 3   rosuvastatin (CRESTOR) 10 MG tablet, Take 1 tablet (10 mg total) by mouth at bedtime., Disp: 90 tablet, Rfl: 3   sodium chloride (OCEAN) 0.65 % SOLN nasal spray, Place 2 sprays into both nostrils as needed for congestion., Disp: 30 mL, Rfl: 11   sucralfate (CARAFATE) 1 g tablet, TAKE 1 TABLET BY MOUTH FOUR TIMES A DAY, Disp: 120 tablet, Rfl: 11   traZODone (DESYREL) 100 MG tablet, Take 1 tablet (100 mg total) by mouth at bedtime as needed for sleep., Disp: 90 tablet, Rfl: 3   FLUoxetine (PROZAC) 10 MG capsule, Take 3  capsules (30 mg total) by mouth daily., Disp: 90 capsule, Rfl: 3   memantine (NAMENDA) 10 MG tablet, Take 1 tablet (10 mg total) by mouth 2 (two) times daily., Disp: 180 tablet, Rfl: 3   tadalafil (CIALIS) 5 MG tablet, Take 1 tablet (5 mg total) by mouth daily as needed for erectile dysfunction., Disp: 15 tablet, Rfl: 0   tamsulosin (FLOMAX) 0.4 MG CAPS capsule, Take 0.4 mg by mouth daily., Disp: , Rfl:   Observations/Objective: Physical Exam Vitals reviewed.  Constitutional:      General: He is not in acute distress.    Appearance: Normal appearance. He is not toxic-appearing.  Eyes:      Conjunctiva/sclera: Conjunctivae normal.  Pulmonary:     Effort: Pulmonary effort is normal.  Neurological:     Mental Status: He is alert. Mental status is at baseline.  Psychiatric:        Mood and Affect: Mood normal.        Behavior: Behavior normal.        Thought Content: Thought content normal.        Judgment: Judgment normal.    Assessment and Plan: Erectile dysfunction, unspecified erectile dysfunction type Assessment & Plan: Previous history of use of Viagra and Cialis.  History of prostate Cancer and has been following with urology.  Not currently on nitrates. Discussed with patient importance of not using any nitrates with Cialis.  Educated that if patient were to have any chest pain he must notify provider if taking Cialis within 72 hours Erections more than 4 hours to be evaluated at urgent care or ED Patient verbalized understanding    Orders: -     Tadalafil; Take 1 tablet (5 mg total) by mouth daily as needed for erectile dysfunction.  Dispense: 15 tablet; Refill: 0   Follow Up Instructions: Return if symptoms worsen or fail to improve.   I discussed the assessment and treatment plan with the patient. The patient was provided an opportunity to ask questions and all were answered. The patient agreed with the plan and demonstrated an understanding of the instructions.   The patient was advised to call back or seek an in-person evaluation if the symptoms worsen or if the condition fails to improve as anticipated.  The above assessment and management plan was discussed with the patient. The patient verbalized understanding of and has agreed to the management plan. Patient is aware to call the clinic if symptoms persist or worsen. Patient is aware when to return to the clinic for a follow-up visit. Patient educated on when it is appropriate to go to the emergency department.   Carollee Leitz, MD

## 2022-06-17 NOTE — Assessment & Plan Note (Addendum)
Previous history of use of Viagra and Cialis.  History of prostate Cancer and has been following with urology.  Not currently on nitrates. Discussed with patient importance of not using any nitrates with Cialis.  Educated that if patient were to have any chest pain he must notify provider if taking Cialis within 72 hours Erections more than 4 hours to be evaluated at urgent care or ED Patient verbalized understanding

## 2022-06-17 NOTE — Patient Instructions (Signed)
It was a pleasure meeting you today. Thank you for allowing me to take part in your health care.  Our goals for today as we discussed include:  Start Cialis 5 mg daily as needed   If you have any questions or concerns, please do not hesitate to call the office at [phone].  I look forward to our next visit and until then take care and stay safe.  Regards,   Carollee Leitz, MD   [facility]

## 2022-06-23 ENCOUNTER — Telehealth: Payer: Self-pay

## 2022-06-23 ENCOUNTER — Ambulatory Visit
Admission: RE | Admit: 2022-06-23 | Discharge: 2022-06-23 | Disposition: A | Discharge status: Home / Self Care | Payer: Medicare HMO | Source: Ambulatory Visit | Attending: Medical Oncology | Admitting: Medical Oncology

## 2022-06-23 DIAGNOSIS — Z85118 Personal history of other malignant neoplasm of bronchus and lung: Secondary | ICD-10-CM | POA: Diagnosis not present

## 2022-06-23 DIAGNOSIS — C3492 Malignant neoplasm of unspecified part of left bronchus or lung: Secondary | ICD-10-CM | POA: Insufficient documentation

## 2022-06-23 DIAGNOSIS — C3490 Malignant neoplasm of unspecified part of unspecified bronchus or lung: Secondary | ICD-10-CM | POA: Diagnosis not present

## 2022-06-23 DIAGNOSIS — J432 Centrilobular emphysema: Secondary | ICD-10-CM | POA: Diagnosis not present

## 2022-06-23 DIAGNOSIS — Z08 Encounter for follow-up examination after completed treatment for malignant neoplasm: Secondary | ICD-10-CM | POA: Diagnosis not present

## 2022-06-23 NOTE — Telephone Encounter (Signed)
I called and spoke with someone at [facility] and patient has a refill left there and I called and informed him and it is ready for pickup.  Neilani Duffee,cma

## 2022-06-23 NOTE — Progress Notes (Signed)
06/24/2022 3:47 PM   [name] 1946/04/16 [id number]  Referring provider: McLean-Scocuzza, [name], MD [place],  [place] [address]  Urological history: 1. ED -contributing factors of age, alcohol consumption, anxiety, depression, COPD on O2 at night, smoker, prediabetes, CAD, heart disease, marijuana, BPH, HLD, HTN, prostate cancer and pelvic radiation -SHIM 5 -failed PDE5i's - but states his friend's Cialis worked for him   2. Prostate cancer -PSA (03/2022) <0.1 -low risk prostate cancer -brachytherapy (2017)   3. Urethral stricture  - Optilume balloon (07/2021)   4. Renal cyst -contrast CT (07/2021) - Multiple bilateral renal cysts  4. LU TS -contributing factors of age, alcohol consumption, anxiety, depression, COPD on O2 at night, smoker, prediabetes, CAD, heart disease, marijuana, BPH, HTN, prostate cancer and pelvic radiation -I PSS 8/3 -PVR 0 mL   Chief Complaint  Patient presents with   Benign Prostatic Hypertrophy   Urinary Incontinence   Erectile Dysfunction   Prostate Cancer    HPI: ASHVIK GRUNDMAN is a 76 y.o. male who presents today for ED and urination issues.    At visit on 05/20/2022, he was having issues with urge incontinence for one year. He was taking Myrbetriq 50 mg daily and tamsulosin 0.4 mg daily.  I PSS 6/4.  UA yellow clear, SG 1.025, pH 5.5, 2+ protein and moderate bacteria.  PVR 0 mL.  UA was benign.   He stopped the tamsulosin and continued the Myrbetriq.   I PSS 8/3  He is having urge incontinence.  Patient denies any modifying or aggravating factors.  Patient denies any gross hematuria, dysuria or suprapubic/flank pain.  Patient denies any fevers, chills, nausea or vomiting.    He noticed no improvement when he stopped the tamsulosin with his urge incontinence.  UA yellow clear, SG 1.025, trace blood, pH 6.5, 1+ protein, 0-5 WBC's, 3-10 RBC's, 0-10 epithelial cells, mucus threads present and few bacteria  PVR  0 mL    IPSS     Row Name 06/24/22 1500         International Prostate Symptom Score   How often have you had the sensation of not emptying your bladder? Not at All     How often have you had to urinate less than every two hours? Less than half the time     How often have you found you stopped and started again several times when you urinated? Not at All     How often have you found it difficult to postpone urination? About half the time     How often have you had a weak urinary stream? Not at All     How often have you had to strain to start urination? Not at All     How many times did you typically get up at night to urinate? 3 Times     Total IPSS Score 8       Quality of Life due to urinary symptoms   If you were to spend the rest of your life with your urinary condition just the way it is now how would you feel about that? Mixed               Score:  1-7 Mild 8-19 Moderate 20-35 Severe   At his visit on 05/20/2022, patient was not having spontaneous erections.  He denied any pain or curvature with erections.  He had no response with PDE5i's, but his PCP messaged me stating he had tried  a friend's Cialis and it worked.   SHIM 5  He has not picked up the prescription yet, so today's SHIM score reflects his ED w/o meds.  Patient is not having spontaneous erections.  He denies any pain or curvature with erections.     SHIM     Row Name 06/24/22 1527         SHIM: Over the last 6 months:   How do you rate your confidence that you could get and keep an erection? Very Low     When you had erections with sexual stimulation, how often were your erections hard enough for penetration (entering your partner)? Almost Never or Never     During sexual intercourse, how often were you able to maintain your erection after you had penetrated (entered) your partner? Almost Never or Never     During sexual intercourse, how difficult was it to maintain your erection to completion of  intercourse? Extremely Difficult     When you attempted sexual intercourse, how often was it satisfactory for you? Almost Never or Never       SHIM Total Score   SHIM 5               Score: 1-7 Severe ED 8-11 Moderate ED 12-16 Mild-Moderate ED 17-21 Mild ED 22-25 No ED    PMH: Past Medical History:  Diagnosis Date   Abnormal findings on diagnostic imaging of digestive system    Acute kidney injury ([place]) 07/28/2014   Alcohol abuse    pt states it is marijuana   Anemia    Arthritis    Aspiration pneumonia ([place]) 07/28/2014   Bipolar disorder (HCC)    BPH (benign prostatic hypertrophy) with urinary obstruction    CAD (coronary artery disease)    a. 2006 PCI/DES to LAD, EF 60%.   Chest pain 07/28/2014   Chronic heart failure with preserved ejection fraction (HFpEF) ([place])    a. 07/2014 Echo: EF 55%; b. 11/2020 Echo: EF 50-55%, mod LVH, GrI DD, mildly enlarged RV w/ nl fxn, mild BAE.   Chronic pancreatitis (HCC)    Cirrhosis ([place])    COPD (chronic obstructive pulmonary disease) ([place])    Coronary artery disease involving native heart with angina pectoris ([place]) 05/31/2018   COVID-19    08/22/20   Dementia ([place])    early dementia   Depression    Depression, recurrent ([place]) 06/14/2018   Dilated ascending aorta and aortic root ([place])    a. 11/2020 Echo: Ao root 67mm, Asc Ao 62mm. Ao arch 57mm.   Diverticulitis    12/06/20 [facility]    Diverticulosis    Drug use    Dyspnea    ED (erectile dysfunction)    Elevated troponin I level 07/28/2014   Gastritis    GERD (gastroesophageal reflux disease)    Headache    occasional migraines   Hepatitis C    treated   History of lumbar fusion    History of pancreatitis 08/15/2019   X 2    Hyperlipidemia    Hypertension    patient denies having high blood pressure   Lung cancer ([place])    Memory loss 08/15/2019   Pancreatitis    x 2    Pneumonia    07/06/18    Premature atrial contractions    a. 04/2021 [facility]: Freq PACs w/ 8%  burden.   Prostate cancer ([place])    with seeds   PSVT (paroxysmal supraventricular tachycardia)    a.  04/2021 [facility]: Avg HR 80 (60-210). 42 SVT runs - longest 5 beats @ 116, fastest 210 x 5 beats. Freq PACs (8%).   PTSD (post-traumatic stress disorder)    Rib fracture    s/p fall 03/15/19    Right ear pain 07/18/2021   Sinus bradycardia 01/29/2021   Sinus disease    Syncope 01/29/2021   Urge incontinence    Viral upper respiratory tract infection 07/18/2021    Surgical History: Past Surgical History:  Procedure Laterality Date   CARDIAC CATHETERIZATION  2006   cateract  2013   COLONOSCOPY WITH PROPOFOL N/A 10/04/2019   Procedure: COLONOSCOPY WITH PROPOFOL;  Surgeon: Lucilla Lame, MD;  Location: [facility] ENDOSCOPY;  Service: Endoscopy;  Laterality: N/A;   CORONARY ANGIOPLASTY  2006   CORONARY STENT PLACEMENT  2006   x 1   CYSTOSCOPY W/ RETROGRADES N/A 08/08/2021   Procedure: CYSTOSCOPY WITH RETROGRADE PYELOGRAM;  Surgeon: Hollice Espy, MD;  Location: [facility] ORS;  Service: Urology;  Laterality: N/A;   CYSTOSCOPY WITH URETHRAL DILATATION N/A 08/08/2021   Procedure: CYSTOSCOPY WITH URETHRAL DILATATION WITH UROLUME BALLOON;  Surgeon: Hollice Espy, MD;  Location: [facility] ORS;  Service: Urology;  Laterality: N/A;   ESOPHAGOGASTRODUODENOSCOPY N/A 12/22/2014   Procedure: ESOPHAGOGASTRODUODENOSCOPY (EGD);  Surgeon: [name], MD;  Location: [facility] ENDOSCOPY;  Service: Endoscopy;  Laterality: N/A;   ESOPHAGOGASTRODUODENOSCOPY (EGD) WITH PROPOFOL N/A 10/04/2019   Procedure: ESOPHAGOGASTRODUODENOSCOPY (EGD) WITH PROPOFOL;  Surgeon: Lucilla Lame, MD;  Location: [facility] ENDOSCOPY;  Service: Endoscopy;  Laterality: N/A;   HERNIA REPAIR  2015   left groin   INTERCOSTAL NERVE BLOCK Left 11/09/2019   Procedure: Intercostal Nerve Block;  Surgeon: Melrose Nakayama, MD;  Location: [facility];  Service: Thoracic;  Laterality: Left;   LUMBAR FUSION     RADIOACTIVE SEED IMPLANT N/A 04/22/2016   Procedure:  RADIOACTIVE SEED IMPLANT/BRACHYTHERAPY IMPLANT;  Surgeon: Hollice Espy, MD;  Location: [facility] ORS;  Service: Urology;  Laterality: N/A;   ROBOT ASSISTED INGUINAL HERNIA REPAIR Bilateral 07/06/2018   Procedure: ROBOT ASSISTED INGUINAL HERNIA REPAIR;  Surgeon: Jules Husbands, MD;  Location: [facility] ORS;  Service: General;  Laterality: Bilateral;   TONSILLECTOMY     UMBILICAL HERNIA REPAIR N/A 07/06/2018   Procedure: LAPAROSCOPIC ROBOT ASSISTED UMBILICAL HERNIA;  Surgeon: Jules Husbands, MD;  Location: [facility] ORS;  Service: General;  Laterality: N/A;   XI ROBOTIC ASSISTED THORACOSCOPY- SEGMENTECTOMY Left 11/09/2019   Procedure: XI ROBOTIC ASSISTED THORACOSCOPY-WEDGE RESECTION LEFT LOWER LOBE;  Surgeon: Melrose Nakayama, MD;  Location: [facility];  Service: Thoracic;  Laterality: Left;    Home Medications:  Allergies as of 06/24/2022   No Known Allergies      Medication List        Accurate as of June 24, 2022  3:47 PM. If you have any questions, ask your nurse or doctor.          albuterol 108 (90 Base) MCG/ACT inhaler Commonly known as: VENTOLIN HFA Inhale 1-2 puffs into the lungs every 6 (six) hours as needed for wheezing or shortness of breath.   albuterol (2.5 MG/3ML) 0.083% nebulizer solution Commonly known as: PROVENTIL Take 3 mLs (2.5 mg total) by nebulization every 6 (six) hours as needed for wheezing or shortness of breath.   ARIPiprazole 10 MG tablet Commonly known as: ABILIFY TAKE 1 TABLET BY MOUTH AT BEDTIME   aspirin EC 81 MG tablet Take 81 mg by mouth daily.   celecoxib 200 MG capsule Commonly known as: CELEBREX TAKE 1  CAPSULE BY MOUTH DAILY AFTER BREAKFAST   clopidogrel 75 MG tablet Commonly known as: PLAVIX Take 1 tablet (75 mg total) by mouth daily.   donepezil 5 MG tablet Commonly known as: ARICEPT Take 1 tablet (5 mg total) by mouth at bedtime.   FLUoxetine 20 MG capsule Commonly known as: PROZAC Take 1 capsule (20 mg total) by mouth daily.    fluticasone 50 MCG/ACT nasal spray Commonly known as: FLONASE Place 2 sprays into both nostrils daily. Prn after nasal saline   memantine 5 MG tablet Commonly known as: NAMENDA Take 1 tablet (5 mg total) by mouth 2 (two) times daily.   metoprolol succinate 50 MG 24 hr tablet Commonly known as: TOPROL-XL Take 1 tablet (50 mg total) by mouth daily. Take with or immediately following a meal.   mirabegron ER 50 MG Tb24 tablet Commonly known as: Myrbetriq Take 1 tablet (50 mg total) by mouth daily.   multivitamin with minerals tablet Take 1 tablet by mouth daily.   pantoprazole 40 MG tablet Commonly known as: PROTONIX TAKE 1 TABLET (40 MG TOTAL) BY MOUTH DAILY. 30 MINUTE(S) BEFORE BREAKFAST   rosuvastatin 10 MG tablet Commonly known as: CRESTOR Take 1 tablet (10 mg total) by mouth at bedtime.   sodium chloride 0.65 % Soln nasal spray Commonly known as: OCEAN Place 2 sprays into both nostrils as needed for congestion.   sucralfate 1 g tablet Commonly known as: CARAFATE TAKE 1 TABLET BY MOUTH FOUR TIMES A DAY   tamsulosin 0.4 MG Caps capsule Commonly known as: FLOMAX Take 0.4 mg by mouth daily.   traZODone 100 MG tablet Commonly known as: DESYREL Take 1 tablet (100 mg total) by mouth at bedtime as needed for sleep.        Allergies: No Known Allergies  Family History: Family History  Problem Relation Age of Onset   Heart disease Father        Unclear details. Father passed in late 45's 2/2 cancer.   Colon cancer Father    Alcohol abuse Sister    Drug abuse Sister    Anxiety disorder Sister    Depression Sister    COPD Brother    Obesity Brother    Kidney disease Neg Hx    Prostate cancer Neg Hx     Social History:  reports that he has been smoking cigarettes. He started smoking about 59 years ago. He has a 82.50 pack-year smoking history. He has never used smokeless tobacco. He reports current alcohol use of about 1.0 standard drink of alcohol per week. He  reports current drug use. Frequency: 7.00 times per week. Drug: Marijuana.  ROS: Pertinent ROS in HPI  Physical Exam: BP (!) 134/91   Pulse 63   Ht 6' (1.829 m)   Wt 265 lb (120.2 kg)   BMI 35.94 kg/m   Constitutional:  Well nourished. Alert and oriented, No acute distress. HEENT: [place] AT, moist mucus membranes.  Trachea midline Cardiovascular: No clubbing, cyanosis, or edema. Respiratory: Normal respiratory effort, no increased work of breathing. Neurologic: Grossly intact, no focal deficits, moving all 4 extremities. Psychiatric: Normal mood and affect.   Laboratory Data: Urinalysis See Epic and HPI I have reviewed the labs.   Pertinent Imaging:  05/20/22 16:22  Scan Result 6mL   Assessment & Plan:    1. Erectile dysfunction -He is states that his friend Cialis worked really well for him and [facility], but [facility] was too [facility] -  I have given him a good Rx card so that he could have the good Rx price and get the Cialis  2. BPH with LUTS -UA 3-10 RBC's -PVR < 300 cc  -most bothersome symptoms are urge incontinence -continue conservative management, avoiding bladder irritants and timed voiding's  3. Urge incontinence -many contributing factors (pelvic radiation, smoking, alcohol consumption and diuretics -UA w/ micro heme -urine is sent for culture and atypical's -discontinue the Myrbetriq 50 mg daily to see if the urge incontinence worsens after stopping the Myrbetriq  4. Prostate cancer -has follow up in 03/2023 w/ Dr. Erlene Quan    Return for pending urine culture results .  These notes generated with voice recognition software. I apologize for typographical errors.  [place], [place] [address]  [place], [place] [address] [phone]

## 2022-06-23 NOTE — Telephone Encounter (Signed)
Patient states [facility], [facility] [address] [facility]not [facility] [employer]), is waiting to hear from [place] to fill his prescription for celecoxib (CELEBREX) 200 MG capsule.

## 2022-06-24 ENCOUNTER — Ambulatory Visit: Payer: Medicare HMO | Admitting: Urology

## 2022-06-24 ENCOUNTER — Encounter: Payer: Self-pay | Admitting: Urology

## 2022-06-24 ENCOUNTER — Other Ambulatory Visit: Payer: Self-pay

## 2022-06-24 VITALS — BP 134/91 | HR 63 | Ht 72.0 in | Wt 265.0 lb

## 2022-06-24 DIAGNOSIS — R3129 Other microscopic hematuria: Secondary | ICD-10-CM | POA: Diagnosis not present

## 2022-06-24 DIAGNOSIS — N3941 Urge incontinence: Secondary | ICD-10-CM

## 2022-06-24 DIAGNOSIS — R3121 Asymptomatic microscopic hematuria: Secondary | ICD-10-CM | POA: Diagnosis not present

## 2022-06-24 DIAGNOSIS — N401 Enlarged prostate with lower urinary tract symptoms: Secondary | ICD-10-CM | POA: Diagnosis not present

## 2022-06-24 DIAGNOSIS — R4189 Other symptoms and signs involving cognitive functions and awareness: Secondary | ICD-10-CM

## 2022-06-24 DIAGNOSIS — N529 Male erectile dysfunction, unspecified: Secondary | ICD-10-CM | POA: Diagnosis not present

## 2022-06-24 DIAGNOSIS — C61 Malignant neoplasm of prostate: Secondary | ICD-10-CM | POA: Diagnosis not present

## 2022-06-24 LAB — BLADDER SCAN AMB NON-IMAGING

## 2022-06-25 ENCOUNTER — Telehealth: Payer: Self-pay | Admitting: *Deleted

## 2022-06-25 ENCOUNTER — Ambulatory Visit: Payer: Self-pay | Admitting: *Deleted

## 2022-06-25 LAB — URINALYSIS, COMPLETE
Bilirubin, UA: NEGATIVE
Glucose, UA: NEGATIVE
Ketones, UA: NEGATIVE
Leukocytes,UA: NEGATIVE
Nitrite, UA: NEGATIVE
Specific Gravity, UA: 1.025 (ref 1.005–1.030)
Urobilinogen, Ur: 0.2 mg/dL (ref 0.2–1.0)
pH, UA: 6.5 (ref 5.0–7.5)

## 2022-06-25 LAB — MICROSCOPIC EXAMINATION

## 2022-06-25 MED ORDER — TADALAFIL 5 MG PO TABS: 5.0000 mg | ORAL_TABLET | Freq: Every day | ORAL | 0 refills | Status: DC | PRN

## 2022-06-25 NOTE — Telephone Encounter (Signed)
   Telephone encounter was:  Successful.  06/25/2022 Name: TYMEER VAQUERA MRN: [id number] DOB: Dec 04, 1945  DURANT SCIBILIA is a 76 y.o. year old male who is a primary care patient of Carollee Leitz, MD . The community resource team was consulted for assistance with [place] guide performed the following interventions: Patient provided with information about care guide support team and interviewed to confirm resource needs. Patient provided food resources including [place] [address] referral and mailed food bank listings   Follow Up Plan:  No further follow up planned at this time. The patient has been provided with needed resources.  [place] [phone] [address] , [place] [address] Email : [email or web address]

## 2022-06-25 NOTE — Patient Outreach (Signed)
  Care Coordination   Initial Visit Note   06/25/2022 Name: Christopher Orr MRN: [id number] DOB: 29-Nov-1945  Christopher Orr is a 76 y.o. year old male who sees Carollee Leitz, MD for primary care. I spoke with  Christopher Orr by phone today.  What matters to the patients health and wellness today?  Food Resources    Goals Addressed             This Visit's Progress    "I could use some food resources"       Care Coordination Interventions: Phone call to patient to assess for community resource needs Patient states that he lives alone, has a daughter, however her support is limited Patient confirmed being able to complete his on ADL's, receives a minimal amount of food stamps and is agreeable to additional food resources being sent by mail Patient expressed symptoms of stress related to his health and financial resources, however declines follow up with a mental health therapist Depression screen reviewed  Solution-Focused Strategies employed:  Active listening / Reflection utilized  Emotional Support Provided         SDOH assessments and interventions completed:  Yes  SDOH Interventions Today    Flowsheet Row Most Recent Value  SDOH Interventions   Food Insecurity Interventions Other (Comment)  [phone] for food banks]  Housing Interventions Intervention Not Indicated  Transportation Interventions Intervention Not Indicated  Utilities Interventions Intervention Not Indicated  Stress Interventions Provide Counseling        Care Coordination Interventions:  Yes, provided   Follow up plan: No further intervention required.   Encounter Outcome:  Pt. Visit Completed

## 2022-06-25 NOTE — Patient Instructions (Signed)
Visit Information  Thank you for taking time to visit with me today. Please don't hesitate to contact me if I can be of assistance to you.   Following are the goals we discussed today:   Goals Addressed             This Visit's Progress    "I could use some food resources"       Care Coordination Interventions: Phone call to patient to assess for community resource needs Patient states that he lives alone, has a daughter, however her support is limited Patient confirmed being able to complete his on ADL's, receives a minimal amount of food stamps and is agreeable to additional food resources being sent by mail Patient expressed symptoms of stress related to his health and financial resources, however declines follow up with a mental health therapist Depression screen reviewed  Solution-Focused Strategies employed:  Active listening / Reflection utilized  Emotional Support Provided          Please call the care guide team at [phone] if you need to cancel or reschedule your appointment.   If you are experiencing a Mental Health or [place] or need someone to talk to, please call 911   Patient verbalizes understanding of instructions and care plan provided today and agrees to view in [place]. Active MyChart status and patient understanding of how to access instructions and care plan via MyChart confirmed with patient.     No further follow up required: referral made to the resource care guide to provide food resources  [place], [place] Worker  [facility] Care Management [phone]

## 2022-06-26 LAB — CULTURE, URINE COMPREHENSIVE

## 2022-06-26 LAB — GC/CHLAMYDIA PROBE AMP
Chlamydia trachomatis, NAA: NEGATIVE
Neisseria Gonorrhoeae by PCR: NEGATIVE

## 2022-06-29 ENCOUNTER — Other Ambulatory Visit: Payer: Self-pay | Admitting: *Deleted

## 2022-06-29 DIAGNOSIS — C3492 Malignant neoplasm of unspecified part of left bronchus or lung: Secondary | ICD-10-CM

## 2022-06-30 ENCOUNTER — Inpatient Hospital Stay: Payer: Medicare HMO | Attending: Oncology

## 2022-06-30 DIAGNOSIS — Z85118 Personal history of other malignant neoplasm of bronchus and lung: Secondary | ICD-10-CM | POA: Insufficient documentation

## 2022-06-30 DIAGNOSIS — C3492 Malignant neoplasm of unspecified part of left bronchus or lung: Secondary | ICD-10-CM

## 2022-06-30 DIAGNOSIS — Z08 Encounter for follow-up examination after completed treatment for malignant neoplasm: Secondary | ICD-10-CM | POA: Diagnosis not present

## 2022-06-30 LAB — COMPREHENSIVE METABOLIC PANEL
ALT: 14 U/L (ref 0–44)
AST: 18 U/L (ref 15–41)
Albumin: 3.8 g/dL (ref 3.5–5.0)
Alkaline Phosphatase: 81 U/L (ref 38–126)
Anion gap: 8 (ref 5–15)
BUN: 12 mg/dL (ref 8–23)
CO2: 28 mmol/L (ref 22–32)
Calcium: 8.7 mg/dL — ABNORMAL LOW (ref 8.9–10.3)
Chloride: 104 mmol/L (ref 98–111)
Creatinine, Ser: 1.25 mg/dL — ABNORMAL HIGH (ref 0.61–1.24)
GFR, Estimated: >60 mL/min (ref >60)
Glucose, Bld: 108 mg/dL — ABNORMAL HIGH (ref 70–99)
Potassium: 4 mmol/L (ref 3.5–5.1)
Sodium: 140 mmol/L (ref 135–145)
Total Bilirubin: 0.7 mg/dL (ref 0.3–1.2)
Total Protein: 6.8 g/dL (ref 6.5–8.1)

## 2022-06-30 LAB — CBC WITH DIFFERENTIAL/PLATELET
Abs Immature Granulocytes: 0.02 10*3/uL (ref 0.00–0.07)
Basophils Absolute: 0 10*3/uL (ref 0.0–0.1)
Basophils Relative: 0 %
Eosinophils Absolute: 0.3 10*3/uL (ref 0.0–0.5)
Eosinophils Relative: 3 %
HCT: 38.3 % — ABNORMAL LOW (ref 39.0–52.0)
Hemoglobin: 12.5 g/dL — ABNORMAL LOW (ref 13.0–17.0)
Immature Granulocytes: 0 %
Lymphocytes Relative: 22 %
Lymphs Abs: 1.9 10*3/uL (ref 0.7–4.0)
MCH: 32.1 pg (ref 26.0–34.0)
MCHC: 32.6 g/dL (ref 30.0–36.0)
MCV: 98.5 fL (ref 80.0–100.0)
Monocytes Absolute: 0.8 10*3/uL (ref 0.1–1.0)
Monocytes Relative: 9 %
Neutro Abs: 5.8 10*3/uL (ref 1.7–7.7)
Neutrophils Relative %: 66 %
Platelets: 281 10*3/uL (ref 150–400)
RBC: 3.89 MIL/uL — ABNORMAL LOW (ref 4.22–5.81)
RDW: 14.5 % (ref 11.5–15.5)
WBC: 8.8 10*3/uL (ref 4.0–10.5)
nRBC: 0 % (ref 0.0–0.2)

## 2022-07-01 ENCOUNTER — Other Ambulatory Visit: Payer: Self-pay

## 2022-07-01 DIAGNOSIS — Z08 Encounter for follow-up examination after completed treatment for malignant neoplasm: Secondary | ICD-10-CM

## 2022-07-01 DIAGNOSIS — C3492 Malignant neoplasm of unspecified part of left bronchus or lung: Secondary | ICD-10-CM

## 2022-07-01 LAB — MYCOPLASMA / UREAPLASMA CULTURE
Mycoplasma hominis Culture: NEGATIVE
Ureaplasma urealyticum: NEGATIVE

## 2022-07-02 ENCOUNTER — Telehealth: Payer: Self-pay

## 2022-07-02 ENCOUNTER — Encounter: Payer: Self-pay | Admitting: Oncology

## 2022-07-02 ENCOUNTER — Inpatient Hospital Stay: Payer: Medicare HMO | Admitting: Oncology

## 2022-07-02 VITALS — BP 113/71 | HR 80 | Temp 98.0°F | Resp 18 | Wt 264.4 lb

## 2022-07-02 DIAGNOSIS — Z08 Encounter for follow-up examination after completed treatment for malignant neoplasm: Secondary | ICD-10-CM | POA: Diagnosis not present

## 2022-07-02 DIAGNOSIS — Z85118 Personal history of other malignant neoplasm of bronchus and lung: Secondary | ICD-10-CM

## 2022-07-02 NOTE — Telephone Encounter (Signed)
Notified pt as advised. Pt states his incontinence has worsened since he quit the Myrbetriq.

## 2022-07-02 NOTE — Telephone Encounter (Signed)
-----   Message from Nori Riis, PA-C sent at 07/01/2022  4:50 PM EST ----- Please let Christopher Orr know that all his cultures are negative.  Has his urge incontinence gotten worse since he discontinued the Myrbetriq?

## 2022-07-02 NOTE — Progress Notes (Signed)
Hematology/Oncology Consult note [facility]  [phone] [phone]  Patient Care Team: Carollee Leitz, MD as PCP - General (Family Medicine) Wellington Hampshire, MD as PCP - Cardiology (Cardiology) Vickie Epley, MD as PCP - Electrophysiology (Cardiology) Telford Nab, RN as Oncology Nurse Navigator Dannielle Karvonen, RN as [place], [place], [place] as Social Worker   Name of the patient: Christopher Orr  [id number]  15-Sep-1945   Date of visit: 07/02/22  Diagnosis-stage Ia lung cancer s/p surgery  Chief complaint/ Reason for visit-routine surveillance visit for lung cancer  Heme/Onc history: Patient is a 76 year old male with past medical history significant for hypertension, hepatitis C, cirrhosis, COPD among other medical problems.  He is not on any baseline oxygen.  Patient underwent CT chest without contrast which showed a 1 cm left lower lobe pulmonary nodule concerning for bronchogenic carcinoma which was new as compared to his prior scan in 2006.  No thoracic adenopathy.    PET CT scan in February 2021 showed hypermetabolic left lower lobe lesion measuring 1.2 x 1.2 cm.  No evidence of intrathoracic adenopathy.  Also noted to have hypermetabolic lesion in the left parotid gland 1.7 x 2 cm.  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  Patient underwent wedge resection of the left lower lobe lung nodule in April 2021 which showed a 1.6 cm squamous cell carcinoma of.  No involvement of visceral.  Margins negative. 5 lymph nodes negative for malignancy.  Patient did not require any adjuvant chemotherapy and is currently in remission under surveillance    Interval history-patient is doing well overall.  He reports chronic fatigue.  He continues to smoke and not interested in quitting.  ECOG PS- 1 Pain scale- 0   Review of systems- Review of Systems   Constitutional:  Positive for malaise/fatigue. Negative for chills, fever and weight loss.  HENT:  Negative for congestion, ear discharge and nosebleeds.   Eyes:  Negative for blurred vision.  Respiratory:  Negative for cough, hemoptysis, sputum production, shortness of breath and wheezing.   Cardiovascular:  Negative for chest pain, palpitations, orthopnea and claudication.  Gastrointestinal:  Negative for abdominal pain, blood in stool, constipation, diarrhea, heartburn, melena, nausea and vomiting.  Genitourinary:  Negative for dysuria, flank pain, frequency, hematuria and urgency.  Musculoskeletal:  Negative for back pain, joint pain and myalgias.  Skin:  Negative for rash.  Neurological:  Negative for dizziness, tingling, focal weakness, seizures, weakness and headaches.  Endo/Heme/Allergies:  Does not bruise/bleed easily.  Psychiatric/Behavioral:  Negative for depression and suicidal ideas. The patient does not have insomnia.       No Known Allergies   Past Medical History:  Diagnosis Date   Abnormal findings on diagnostic imaging of digestive system    Acute kidney injury ([place]) 07/28/2014   Alcohol abuse    pt states it is marijuana   Anemia    Arthritis    Aspiration pneumonia ([place]) 07/28/2014   Bipolar disorder (HCC)    BPH (benign prostatic hypertrophy) with urinary obstruction    CAD (coronary artery disease)    a. 2006 PCI/DES to LAD, EF 60%.   Chest pain 07/28/2014   Chronic heart failure with preserved ejection fraction (HFpEF) ([employer])    a. 07/2014 Echo: EF 55%; b. 11/2020 Echo: EF 50-55%, mod LVH, GrI DD, mildly enlarged RV w/ nl fxn, mild BAE.   Chronic pancreatitis ([place])  Cirrhosis ([place])    COPD (chronic obstructive pulmonary disease) (HCC)    Coronary artery disease involving native heart with angina pectoris ([place]) 05/31/2018   COVID-19    08/22/20   Dementia ([place])    early dementia   Depression    Depression, recurrent ([place]) 06/14/2018   Dilated  ascending aorta and aortic root ([place])    a. 11/2020 Echo: Ao root 42mm, Asc Ao 64mm. Ao arch 61mm.   Diverticulitis    12/06/20 [facility]    Diverticulosis    Drug use    Dyspnea    ED (erectile dysfunction)    Elevated troponin I level 07/28/2014   Gastritis    GERD (gastroesophageal reflux disease)    Headache    occasional migraines   Hepatitis C    treated   History of lumbar fusion    History of pancreatitis 08/15/2019   X 2    Hyperlipidemia    Hypertension    patient denies having high blood pressure   Lung cancer ([place])    Memory loss 08/15/2019   Pancreatitis    x 2    Pneumonia    07/06/18    Premature atrial contractions    a. 04/2021 [facility]: Freq PACs w/ 8% burden.   Prostate cancer ([place])    with seeds   PSVT (paroxysmal supraventricular tachycardia)    a. 04/2021 Zio: Avg HR 80 (60-210). 42 SVT runs - longest 5 beats @ 116, fastest 210 x 5 beats. Freq PACs (8%).   PTSD (post-traumatic stress disorder)    Rib fracture    s/p fall 03/15/19    Right ear pain 07/18/2021   Sinus bradycardia 01/29/2021   Sinus disease    Syncope 01/29/2021   Urge incontinence    Viral upper respiratory tract infection 07/18/2021     Past Surgical History:  Procedure Laterality Date   CARDIAC CATHETERIZATION  2006   cateract  2013   COLONOSCOPY WITH PROPOFOL N/A 10/04/2019   Procedure: COLONOSCOPY WITH PROPOFOL;  Surgeon: Lucilla Lame, MD;  Location: [facility] ENDOSCOPY;  Service: Endoscopy;  Laterality: N/A;   CORONARY ANGIOPLASTY  2006   CORONARY STENT PLACEMENT  2006   x 1   CYSTOSCOPY W/ RETROGRADES N/A 08/08/2021   Procedure: CYSTOSCOPY WITH RETROGRADE PYELOGRAM;  Surgeon: Hollice Espy, MD;  Location: [facility] ORS;  Service: Urology;  Laterality: N/A;   CYSTOSCOPY WITH URETHRAL DILATATION N/A 08/08/2021   Procedure: CYSTOSCOPY WITH URETHRAL DILATATION WITH UROLUME BALLOON;  Surgeon: Hollice Espy, MD;  Location: [facility] ORS;  Service: Urology;  Laterality: N/A;    ESOPHAGOGASTRODUODENOSCOPY N/A 12/22/2014   Procedure: ESOPHAGOGASTRODUODENOSCOPY (EGD);  Surgeon: [name], MD;  Location[facility] ENDOSCOPY;  Service: Endoscopy;  Laterality: N/A;   ESOPHAGOGASTRODUODENOSCOPY (EGD) WITH PROPOFOL N/A 10/04/2019   Procedure: ESOPHAGOGASTRODUODENOSCOPY (EGD) WITH PROPOFOL;  Surgeon: Lucilla Lame, MD;  Location: [facility] ENDOSCOPY;  Service: Endoscopy;  Laterality: N/A;   HERNIA REPAIR  2015   left groin   INTERCOSTAL NERVE BLOCK Left 11/09/2019   Procedure: Intercostal Nerve Block;  Surgeon: Melrose Nakayama, MD;  Location: [place];  Service: Thoracic;  Laterality: Left;   LUMBAR FUSION     RADIOACTIVE SEED IMPLANT N/A 04/22/2016   Procedure: RADIOACTIVE SEED IMPLANT/BRACHYTHERAPY IMPLANT;  Surgeon: Hollice Espy, MD;  Location: [facility] ORS;  Service: Urology;  Laterality: N/A;   ROBOT ASSISTED INGUINAL HERNIA REPAIR Bilateral 07/06/2018   Procedure: ROBOT ASSISTED INGUINAL HERNIA REPAIR;  Surgeon: Jules Husbands, MD;  Location: [facility] ORS;  Service:  General;  Laterality: Bilateral;   TONSILLECTOMY     UMBILICAL HERNIA REPAIR N/A 07/06/2018   Procedure: LAPAROSCOPIC ROBOT ASSISTED UMBILICAL HERNIA;  Surgeon: Jules Husbands, MD;  Location: [facility] ORS;  Service: General;  Laterality: N/A;   XI ROBOTIC ASSISTED THORACOSCOPY- SEGMENTECTOMY Left 11/09/2019   Procedure: XI ROBOTIC ASSISTED THORACOSCOPY-WEDGE RESECTION LEFT LOWER LOBE;  Surgeon: Melrose Nakayama, MD;  Location: [facility] OR;  Service: Thoracic;  Laterality: Left;    Social History   Socioeconomic History   Marital status: Divorced    Spouse name: Not on file   Number of children: 4   Years of education: Not on file   Highest education level: GED or equivalent  Occupational History   Occupation: Aeronautical engineer    Comment: retired  Tobacco Use   Smoking status: Every Day    Packs/day: 1.50    Years: 55.00    Total pack years: 82.50    Types: Cigarettes    Start date: 07/28/1962   Smokeless  tobacco: Never   Tobacco comments:    1PPD 01/30/2022  Vaping Use   Vaping Use: Never used  Substance and Sexual Activity   Alcohol use: Yes    Alcohol/week: 1.0 standard drink of alcohol    Types: 1 Cans of beer per week    Comment: 2-3 beers a day   Drug use: Yes    Frequency: 7.0 times per week    Types: Marijuana    Comment: Endorsed heroin 06/2014, UDS also + benzos, opiates, THC, neg for cocaine at that time.   Sexual activity: Not Currently  Other Topics Concern   Not on file  Social History Narrative   Lives at home    Has kids and grandkids   Smoker x 55 years 1 ppd as of 07/2019 he did quit x 2 years    Drinks 1 pt liq qd as of 07/2019    Social Determinants of Health   Financial Resource Strain: Low Risk  (04/29/2022)   Overall Financial Resource Strain (CARDIA)    Difficulty of Paying Living Expenses: Not hard at all  Food Insecurity: Food Insecurity Present (06/25/2022)   Hunger Vital Sign    Worried About Running Out of Food in the Last Year: Sometimes true    Ran Out of Food in the Last Year: Sometimes true  Transportation Needs: No Transportation Needs (06/25/2022)   PRAPARE - Hydrologist (Medical): No    Lack of Transportation (Non-Medical): No  Physical Activity: Inactive (12/25/2017)   Exercise Vital Sign    Days of Exercise per Week: 0 days    Minutes of Exercise per Session: 0 min  Stress: No Stress Concern Present (06/25/2022)   [place]    Feeling of Stress : Only a little  Social Connections: Moderately Isolated (04/17/2021)   Social Connection and Isolation Panel [NHANES]    Frequency of Communication with Friends and Family: More than three times a week    Frequency of Social Gatherings with Friends and Family: Once a week    Attends Religious Services: Never    Marine scientist or Organizations: Yes    Attends Music therapist: More  than 4 times per year    Marital Status: Divorced  Intimate Partner Violence: Not At Risk (04/29/2022)   Humiliation, Afraid, Rape, and Kick questionnaire    Fear of Current or Ex-Partner: No    Emotionally  Abused: No    Physically Abused: No    Sexually Abused: No    Family History  Problem Relation Age of Onset   Heart disease Father        Unclear details. Father passed in late 53's 2/2 cancer.   Colon cancer Father    Alcohol abuse Sister    Drug abuse Sister    Anxiety disorder Sister    Depression Sister    COPD Brother    Obesity Brother    Kidney disease Neg Hx    Prostate cancer Neg Hx      Current Outpatient Medications:    albuterol (PROVENTIL) (2.5 MG/3ML) 0.083% nebulizer solution, Take 3 mLs (2.5 mg total) by nebulization every 6 (six) hours as needed for wheezing or shortness of breath., Disp: 375 mL, Rfl: 3   albuterol (VENTOLIN HFA) 108 (90 Base) MCG/ACT inhaler, Inhale 1-2 puffs into the lungs every 6 (six) hours as needed for wheezing or shortness of breath., Disp: 18 g, Rfl: 12   ARIPiprazole (ABILIFY) 10 MG tablet, TAKE 1 TABLET BY MOUTH AT BEDTIME, Disp: 90 tablet, Rfl: 3   aspirin EC 81 MG tablet, Take 81 mg by mouth daily., Disp: , Rfl:    celecoxib (CELEBREX) 200 MG capsule, TAKE 1 CAPSULE BY MOUTH DAILY AFTER BREAKFAST, Disp: 28 capsule, Rfl: 3   clopidogrel (PLAVIX) 75 MG tablet, Take 1 tablet (75 mg total) by mouth daily., Disp: 90 tablet, Rfl: 3   donepezil (ARICEPT) 5 MG tablet, Take 1 tablet (5 mg total) by mouth at bedtime., Disp: 90 tablet, Rfl: 3   FLUoxetine (PROZAC) 20 MG capsule, Take 1 capsule (20 mg total) by mouth daily., Disp: 90 capsule, Rfl: 3   fluticasone (FLONASE) 50 MCG/ACT nasal spray, Place 2 sprays into both nostrils daily. Prn after nasal saline, Disp: 16 g, Rfl: 11   memantine (NAMENDA) 5 MG tablet, Take 1 tablet (5 mg total) by mouth 2 (two) times daily., Disp: 180 tablet, Rfl: 3   metoprolol succinate (TOPROL-XL) 50 MG 24 hr  tablet, Take 1 tablet (50 mg total) by mouth daily. Take with or immediately following a meal., Disp: 90 tablet, Rfl: 3   mirabegron ER (MYRBETRIQ) 50 MG TB24 tablet, Take 1 tablet (50 mg total) by mouth daily., Disp: 90 tablet, Rfl: 3   Multiple Vitamins-Minerals (MULTIVITAMIN WITH MINERALS) tablet, Take 1 tablet by mouth daily., Disp: , Rfl:    pantoprazole (PROTONIX) 40 MG tablet, TAKE 1 TABLET (40 MG TOTAL) BY MOUTH DAILY. 30 MINUTE(S) BEFORE BREAKFAST, Disp: 90 tablet, Rfl: 3   rosuvastatin (CRESTOR) 10 MG tablet, Take 1 tablet (10 mg total) by mouth at bedtime., Disp: 90 tablet, Rfl: 3   sodium chloride (OCEAN) 0.65 % SOLN nasal spray, Place 2 sprays into both nostrils as needed for congestion., Disp: 30 mL, Rfl: 11   sucralfate (CARAFATE) 1 g tablet, TAKE 1 TABLET BY MOUTH FOUR TIMES A DAY, Disp: 120 tablet, Rfl: 11   tadalafil (CIALIS) 5 MG tablet, Take 1 tablet (5 mg total) by mouth daily as needed for erectile dysfunction., Disp: 15 tablet, Rfl: 0   tamsulosin (FLOMAX) 0.4 MG CAPS capsule, Take 0.4 mg by mouth daily., Disp: , Rfl:    traZODone (DESYREL) 100 MG tablet, Take 1 tablet (100 mg total) by mouth at bedtime as needed for sleep., Disp: 90 tablet, Rfl: 3  Physical exam:  Vitals:   07/02/22 1312  BP: 113/71  Pulse: 80  Resp: 18  Temp: 98 F (  36.7 C)  SpO2: 96%  Weight: 264 lb 6.4 oz (119.9 kg)   Physical Exam Cardiovascular:     Rate and Rhythm: Normal rate and regular rhythm.     Heart sounds: Normal heart sounds.  Pulmonary:     Effort: Pulmonary effort is normal.     Breath sounds: Normal breath sounds.  Abdominal:     General: Bowel sounds are normal.     Palpations: Abdomen is soft.  Skin:    General: Skin is warm and dry.  Neurological:     Mental Status: He is alert and oriented to person, place, and time.         Latest Ref Rng & Units 06/30/2022   10:01 AM  CMP  Glucose 70 - 99 mg/dL 108   BUN 8 - 23 mg/dL 12   Creatinine 0.61 - 1.24 mg/dL 1.25    Sodium 135 - 145 mmol/L 140   Potassium 3.5 - 5.1 mmol/L 4.0   Chloride 98 - 111 mmol/L 104   CO2 22 - 32 mmol/L 28   Calcium 8.9 - 10.3 mg/dL 8.7   Total Protein 6.5 - 8.1 g/dL 6.8   Total Bilirubin 0.3 - 1.2 mg/dL 0.7   Alkaline Phos 38 - 126 U/L 81   AST 15 - 41 U/L 18   ALT 0 - 44 U/L 14       Latest Ref Rng & Units 06/30/2022   10:01 AM  CBC  WBC 4.0 - 10.5 K/uL 8.8   Hemoglobin 13.0 - 17.0 g/dL 12.5   Hematocrit 39.0 - 52.0 % 38.3   Platelets 150 - 400 K/uL 281     No images are attached to the encounter.  CT Chest Wo Contrast  Result Date: 06/24/2022 CLINICAL DATA:  Non-small cell lung cancer restaging. Prior left lower lobe segmentectomy. History of prostate cancer. * Tracking Code: BO * EXAM: CT CHEST WITHOUT CONTRAST TECHNIQUE: Multidetector CT imaging of the chest was performed following the standard protocol without IV contrast. RADIATION DOSE REDUCTION: This exam was performed according to the departmental dose-optimization program which includes automated exposure control, adjustment of the mA and/or kV according to patient size and/or use of iterative reconstruction technique. COMPARISON:  Multiple exams, including 12/24/2021 FINDINGS: Cardiovascular: Coronary, aortic arch, and branch vessel atherosclerotic vascular disease. Mild mitral valve calcification. Mediastinum/Nodes: No pathologic adenopathy. Lungs/Pleura: Centrilobular emphysema. Scarring in the posterior basal segment right lower lobe. Postoperative findings from prior segmentectomy in the left lower lobe noted with the flat nodularity along the lateral pleural margin anteriorly measuring 1.3 cm in thickness on image 110 series 3 (stable) and the nodularity along the wedge resection clips measuring 2.6 by 1.4 cm on image 124 series 3, likewise stable. No contour change is identified to suggest locally recurrent malignancy. Peripheral thickening along the pleura in the lingula noted up to 8 mm in thickness, stable.  No new or progressive nodule identified. Upper Abdomen: Stable simple cyst of the right kidney upper pole. No further imaging workup of this lesion is indicated. Musculoskeletal: Old healed bilateral rib fractures. Thoracic spondylosis. IMPRESSION: 1. No findings of recurrent malignancy. Stable postoperative findings from prior segmentectomy in the left lower lobe. 2. Stable peripheral thickening along the pleura in the lingula, likely from scarring. 3. Mild mitral valve calcification. 4. Aortic atherosclerosis. 5. Emphysema. Aortic Atherosclerosis (ICD10-I70.0) and Emphysema (ICD10-J43.9). Electronically Signed   By: Van Clines M.D.   On: 06/24/2022 10:40   SLEEP STUDY DOCUMENTS  Result Date: 06/23/2022  Ordered by an unspecified provider.    Assessment and plan- Patient is a 76 y.o. male with history of stage I lung cancer s/p wedge resection in 2021 currently under surveillance here forRoutine follow-up  Clinically patient is doing well with no concerning signs and symptoms of recurrence based on today's exam.  I have reviewed his CT chest images independently from 06/23/2022 which does not show any evidence of recurrent malignancy.  He has chronic emphysema atherosclerosis and mitral valve calcifications.  I will continue to follow-up with him with CT scans every 6 months for up to 3 years from diagnosis which will end in April 2024 and following that he will need yearly scans.   Visit Diagnosis 1. Encounter for follow-up surveillance of lung cancer      Dr. Randa Evens, MD, MPH [facility] at [facility] [phone] 07/02/2022 1:16 PM

## 2022-07-03 ENCOUNTER — Ambulatory Visit (INDEPENDENT_AMBULATORY_CARE_PROVIDER_SITE_OTHER): Payer: Medicare HMO | Admitting: Family Medicine

## 2022-07-03 ENCOUNTER — Encounter: Payer: Self-pay | Admitting: Family Medicine

## 2022-07-03 VITALS — BP 120/80 | HR 68 | Temp 98.6°F | Ht 72.0 in | Wt 267.2 lb

## 2022-07-03 DIAGNOSIS — R413 Other amnesia: Secondary | ICD-10-CM

## 2022-07-03 DIAGNOSIS — N529 Male erectile dysfunction, unspecified: Secondary | ICD-10-CM

## 2022-07-03 DIAGNOSIS — F0280 Dementia in other diseases classified elsewhere without behavioral disturbance: Secondary | ICD-10-CM

## 2022-07-03 DIAGNOSIS — F319 Bipolar disorder, unspecified: Secondary | ICD-10-CM | POA: Diagnosis not present

## 2022-07-03 DIAGNOSIS — G4700 Insomnia, unspecified: Secondary | ICD-10-CM

## 2022-07-03 DIAGNOSIS — F1210 Cannabis abuse, uncomplicated: Secondary | ICD-10-CM

## 2022-07-03 DIAGNOSIS — G301 Alzheimer's disease with late onset: Secondary | ICD-10-CM | POA: Diagnosis not present

## 2022-07-03 DIAGNOSIS — R4189 Other symptoms and signs involving cognitive functions and awareness: Secondary | ICD-10-CM

## 2022-07-03 DIAGNOSIS — F32A Depression, unspecified: Secondary | ICD-10-CM

## 2022-07-03 DIAGNOSIS — F339 Major depressive disorder, recurrent, unspecified: Secondary | ICD-10-CM

## 2022-07-03 DIAGNOSIS — F419 Anxiety disorder, unspecified: Secondary | ICD-10-CM | POA: Diagnosis not present

## 2022-07-03 DIAGNOSIS — F1010 Alcohol abuse, uncomplicated: Secondary | ICD-10-CM | POA: Diagnosis not present

## 2022-07-03 MED ORDER — MEMANTINE HCL 10 MG PO TABS: 10.0000 mg | ORAL_TABLET | Freq: Two times a day (BID) | ORAL | 3 refills | Status: DC

## 2022-07-03 MED ORDER — FLUOXETINE HCL 10 MG PO CAPS: 30.0000 mg | ORAL_CAPSULE | Freq: Every day | ORAL | 3 refills | Status: DC

## 2022-07-03 NOTE — Telephone Encounter (Signed)
Scheduled pt for cysto appt. Advised pt of msg, pt expressed understanding.

## 2022-07-03 NOTE — Patient Instructions (Addendum)
It was a pleasure meeting you today. Thank you for allowing me to take part in your health care.  Our goals for today as we discussed include:  Increase Prozac to 30 mg daily Continue Abilify 10 mg daily Will refer to Psychiatry for evaluation and if needing changes in medications.  Increase Nemanda to 10 mg twice a day.  This is what your Neurologist had recommended when you last seen them in August of 2022.    Follow up in 4 weeks  If you have any questions or concerns, please do not hesitate to call the office at [phone].  I look forward to our next visit and until then take care and stay safe.  Regards,   Carollee Leitz, MD   [facility]

## 2022-07-03 NOTE — Progress Notes (Signed)
SUBJECTIVE:   Chief Complaint  Patient presents with   Follow-up    4 week/Erectile dysfunction/SOB on exertion/ Sleep issues   HPI Patient presents to clinic requesting prescription for Xanax.  Reports having difficulty sleeping this time of year.  Currently taking trazodone 100 mg at night, Abilify 10 mg nightly and Prozac 20 mg daily.  Has not followed up with psychiatry.  He reports he lives by himself and is lonely. Endorses EtOH use.  ED Reports Cialis has been effective.  Cognitive memory impairment Has not followed up with neurology as we previously discussed at last visit.  Currently on Aricept 5 mg nightly and Namenda 5 mg twice daily.     PERTINENT PMH / PSH: Cognitive impairment issues Mood disorder ED   OBJECTIVE:  BP 120/80   Pulse 68   Temp 98.6 F (37 C) (Oral)   Ht 6' (1.829 m)   Wt 267 lb 3.2 oz (121.2 kg)   SpO2 93%   BMI 36.24 kg/m    Physical Exam Vitals reviewed.  Constitutional:      General: He is not in acute distress.    Appearance: Normal appearance. He is normal weight. He is not ill-appearing, toxic-appearing or diaphoretic.  HENT:     Head: Normocephalic.  Eyes:     General:        Right eye: No discharge.        Left eye: No discharge.  Cardiovascular:     Rate and Rhythm: Normal rate and regular rhythm.     Heart sounds: Normal heart sounds.  Pulmonary:     Effort: Pulmonary effort is normal.     Breath sounds: Normal breath sounds.  Abdominal:     General: Bowel sounds are normal.  Musculoskeletal:        General: Normal range of motion.     Cervical back: Normal range of motion.  Skin:    General: Skin is warm and dry.  Neurological:     Mental Status: He is alert and oriented to person, place, and time. Mental status is at baseline.  Psychiatric:        Mood and Affect: Mood normal.        Behavior: Behavior normal.     ASSESSMENT/PLAN:  Erectile dysfunction, unspecified erectile dysfunction type Assessment &  Plan: Improved with medications.   Continue Cialis 5 mg as needed. Follow-up with urology   Cognitive impairment -     Memantine HCl; Take 1 tablet (10 mg total) by mouth 2 (two) times daily.  Dispense: 180 tablet; Refill: 3 -     Ambulatory referral to Psychiatry  Anxiety and depression Assessment & Plan: Do not think adding Xanax is appropriate given cognitive impairment, previous history of falls, EtOH use. Increase Prozac to 30 mg daily Currently on Abilify 10 mg nightly, trazodone 100 mg nightly Offered psychiatry referral, patient declined Will continue to encourage psychiatry evaluation.    Late onset Alzheimer's disease without behavioral disturbance [facility]) Assessment & Plan: Appointment scheduled for follow-up with neurology 01/05 I have increased his Namenda to 10 mg twice daily per previous notes from neurology. Continue Aricept 5 mg nightly Patient is aware of neurology appointment and the importance of attending the appointment.  I am hoping that his daughter will be able to go with him to discuss goals of care and dementia planning.  Orders: -     Ambulatory referral to Psychiatry  Insomnia, unspecified type -     Ambulatory referral  to Psychiatry  Bipolar affective disorder, remission status unspecified ([place]) -     Ambulatory referral to Psychiatry  Alcohol abuse -     Ambulatory referral to Psychiatry  Tetrahydrocannabinol [facility]) use disorder, mild, abuse -     Ambulatory referral to Psychiatry  Memory loss -     Ambulatory referral to Psychiatry  Depression, recurrent ([place]) -     Ambulatory referral to Psychiatry  Other orders -     FLUoxetine HCl; Take 3 capsules (30 mg total) by mouth daily.  Dispense: 90 capsule; Refill: 3   PDMP reviewed  Return in about 4 weeks (around 07/31/2022) for PCP, mood.  Carollee Leitz, MD

## 2022-07-04 ENCOUNTER — Ambulatory Visit: Payer: Self-pay

## 2022-07-04 NOTE — Patient Outreach (Signed)
  Care Coordination   Follow Up Visit Note   07/04/2022 Name: Christopher Orr MRN: [id number] DOB: 12-Dec-1945  Christopher Orr is a 76 y.o. year old male who sees Christopher Leitz, MD for primary care. I spoke with  Christopher Orr by phone today.  What matters to the patients health and wellness today?  Patient states he has received the information he needed regarding food resources.  Patient states he does not have any nursing needs or any further community resource needs.     Goals Addressed             This Visit's Progress    COMPLETED: Care coordination activities - no follow up needed       Care Coordination Interventions: Patient contacted to confirm he received community resources for food insecurity.  Patient states he has received the information needed.  Patient advised to contact RNCM or primary care provider if care coordination services needed in the future.            SDOH assessments and interventions completed:  No     Care Coordination Interventions:  Yes, provided   Follow up plan: No further intervention required.   Encounter Outcome:  Pt. Visit Completed   Christopher Plowman RN,BSN,CCM [place] [phone] direct line

## 2022-07-05 ENCOUNTER — Encounter: Payer: Self-pay | Admitting: Family Medicine

## 2022-07-15 ENCOUNTER — Ambulatory Visit: Payer: Medicare HMO | Admitting: Urology

## 2022-07-15 VITALS — BP 120/69 | HR 60 | Ht 72.0 in | Wt 263.0 lb

## 2022-07-15 DIAGNOSIS — R3129 Other microscopic hematuria: Secondary | ICD-10-CM

## 2022-07-15 DIAGNOSIS — N3941 Urge incontinence: Secondary | ICD-10-CM

## 2022-07-15 LAB — URINALYSIS, COMPLETE
Bilirubin, UA: NEGATIVE
Glucose, UA: NEGATIVE
Leukocytes,UA: NEGATIVE
Nitrite, UA: NEGATIVE
Specific Gravity, UA: 1.025 (ref 1.005–1.030)
Urobilinogen, Ur: 0.2 mg/dL (ref 0.2–1.0)
pH, UA: 5.5 (ref 5.0–7.5)

## 2022-07-15 LAB — MICROSCOPIC EXAMINATION

## 2022-07-15 NOTE — Progress Notes (Signed)
   07/15/22  CC:  Chief Complaint  Patient presents with   Cysto    HPI: 80 male with a personal history of severe urge incontinence, urethral stricture disease who presents today for cystoscopy.  He also has a personal history of prostate cancer status post radiation former brachytherapy.  Please see previous notes for details.  Urinalysis today does show 3-10 red blood cells per high-power field.  Most recent upper tract imaging in the form of bilateral retrograde pyelogram completed in 07/2021 as well as an CT abdomen pelvis from 07/2021 around the same time.  Blood pressure 120/69, pulse 60, height 6' (1.829 m), weight 263 lb (119.3 kg). NED. A&Ox3.   No respiratory distress   Abd soft, NT, ND Normal phallus with bilateral descended testicles  Cystoscopy Procedure Note  Patient identification was confirmed, informed consent was obtained, and patient was prepped using Betadine solution.  Lidocaine jelly was administered per urethral meatus.     Pre-Procedure: - Inspection reveals a normal caliber ureteral meatus.  Procedure: The flexible cystoscope was introduced without difficulty - No urethral strictures/lesions are present. - Normal prostate  - Normal bladder neck - Bilateral ureteral orifices identified - Bladder mucosa  reveals no ulcers, tumors, or lesions - No bladder stones -Mild/moderate trabeculation  Retroflexion unremarkable   Post-Procedure: - Patient tolerated the procedure well  Assessment/ Plan:  1. Urge incontinence Severe, refractory  No evidence of recurrent stricture disease  On previous occasions, discussed consideration of second line therapies such as PTNS or Botox, we will further this discussion at follow-up  - Urinalysis, Complete  2. Microscopic hematuria Cystoscopy today is unremarkable as outlined above - Urinalysis, Complete  Follow-up with Zara Council to discuss urge incontinence options  Hollice Espy, MD

## 2022-07-21 ENCOUNTER — Encounter: Payer: Self-pay | Admitting: Family Medicine

## 2022-07-21 NOTE — Assessment & Plan Note (Addendum)
Improved with medications.   Continue Cialis 5 mg as needed. Follow-up with urology

## 2022-07-21 NOTE — Assessment & Plan Note (Signed)
Appointment scheduled for follow-up with neurology 01/05 I have increased his Namenda to 10 mg twice daily per previous notes from neurology. Continue Aricept 5 mg nightly Patient is aware of neurology appointment and the importance of attending the appointment.  I am hoping that his daughter will be able to go with him to discuss goals of care and dementia planning.

## 2022-07-21 NOTE — Assessment & Plan Note (Signed)
Do not think adding Xanax is appropriate given cognitive impairment, previous history of falls, EtOH use. Increase Prozac to 30 mg daily Currently on Abilify 10 mg nightly, trazodone 100 mg nightly Offered psychiatry referral, patient declined Will continue to encourage psychiatry evaluation.

## 2022-07-24 ENCOUNTER — Telehealth: Payer: Self-pay | Admitting: Family Medicine

## 2022-07-24 NOTE — Telephone Encounter (Signed)
Spoke to Patient and let him know that he needs to call oncology regarding cancer issues and diagnosis. Patient verbalized understanding and is agreeable.

## 2022-07-24 NOTE — Telephone Encounter (Signed)
Pt called today asking for advice. He wants to know what type of cancer/ diagnosis its going on with him.

## 2022-07-24 NOTE — Telephone Encounter (Signed)
Attempted to call Patient- no answer and voicemail is full

## 2022-07-25 ENCOUNTER — Other Ambulatory Visit: Payer: Self-pay

## 2022-07-25 ENCOUNTER — Telehealth: Payer: Self-pay | Admitting: Family Medicine

## 2022-07-25 DIAGNOSIS — K219 Gastro-esophageal reflux disease without esophagitis: Secondary | ICD-10-CM

## 2022-07-25 MED ORDER — PANTOPRAZOLE SODIUM 40 MG PO TBEC: DELAYED_RELEASE_TABLET | ORAL | 3 refills | Status: DC

## 2022-07-25 NOTE — Telephone Encounter (Signed)
error 

## 2022-08-04 ENCOUNTER — Other Ambulatory Visit: Payer: Self-pay

## 2022-08-04 DIAGNOSIS — I2510 Atherosclerotic heart disease of native coronary artery without angina pectoris: Secondary | ICD-10-CM

## 2022-08-04 MED ORDER — CLOPIDOGREL BISULFATE 75 MG PO TABS: 75.0000 mg | ORAL_TABLET | Freq: Every day | ORAL | 3 refills | Status: DC

## 2022-08-04 NOTE — Progress Notes (Unsigned)
Order pended for Plavix for your review

## 2022-08-04 NOTE — Telephone Encounter (Signed)
Pended order for Plavix for your review

## 2022-08-05 NOTE — Progress Notes (Deleted)
08/06/2022 10:03 AM   Christopher Orr 01-22-1946 [id number]  Referring provider: Dana Allan, MD [address] [place],  [place] [address]  Urological history: 1. ED -contributing factors of age, alcohol consumption, anxiety, depression, COPD on O2 at night, smoker, prediabetes, CAD, heart disease, marijuana, BPH, HLD, HTN, prostate cancer and pelvic radiation -SHIM 5 -failed PDE5i's - but states his friend's Cialis worked for him   2. Prostate cancer -PSA (03/2022) <0.1 -low risk prostate cancer -brachytherapy (2017)   3. Urethral stricture  - Optilume balloon (07/2021)   4. Renal cyst -MRI on 12/27/2020 for further evaluation of lesion on the right  kidney. It showed that the lesion of concern in the right kidney anteriorly has reduced complexity compared to previously, and is currently categorized as a Bosniak category 2 complex but benign cyst. Bosniak category 1 and category 2 cysts are present in the kidneys.  -contrast CT (07/2021) - Multiple bilateral renal cysts  5. LU TS -contributing factors of age, alcohol consumption, anxiety, depression, COPD on O2 at night, smoker, prediabetes, CAD, heart disease, marijuana, BPH, HTN, prostate cancer and pelvic radiation -I PSS *** -PVR *** mL   6. High risk hematuria -smoker -hx of micro heme -contrast CT (07/2021) - Multiple cysts in the left kidney. Moderate left hydronephrosis and fat stranding about the left renal hilum which check extend inferiorly along the left ureter. No left ureteral calculus. There also multiple cysts in the right kidney with nonspecific right perinephric fat stranding. Mild right hydronephrosis. -cysto/RTG's (07/2021) - 8 French bulbar urethral stricture - Trabeculated bladder -cysto (06/2022) - Mild/moderate trabeculation  -no reports of gross heme   No chief complaint on file.   HPI: Christopher Orr is a 77 y.o. male who presents today for further discussion on incontinence.         Score:  1-7 Mild 8-19 Moderate 20-35 Severe     PMH: Past Medical History:  Diagnosis Date   Abnormal findings on diagnostic imaging of digestive system    Acute kidney injury (HCC) 07/28/2014   Alcohol abuse    pt states it is marijuana   Anemia    Arthritis    Arthritis 08/15/2019   Aspiration pneumonia (HCC) 07/28/2014   Bilateral inguinal hernia, without obstruction or gangrene, recurrent    Bipolar disorder (HCC)    BPH (benign prostatic hypertrophy) with urinary obstruction    CAD (coronary artery disease)    a. 2006 PCI/DES to LAD, EF 60%.   Chest pain 07/28/2014   Chronic heart failure with preserved ejection fraction (HFpEF) (HCC)    a. 07/2014 Echo: EF 55%; b. 11/2020 Echo: EF 50-55%, mod LVH, GrI DD, mildly enlarged RV w/ nl fxn, mild BAE.   Chronic pancreatitis (HCC)    Chronic viral hepatitis C (HCC) 09/24/2014   Overview:   GT 1a.   started on Harvoni 11/03/2014 for planned 12 week course, elastography shows cirrhosis.   Cirrhosis (HCC)    Cirrhosis of liver (HCC) 08/15/2019   Noted in 2016 imaging with alcohol abuse and h/o hep C tx s/p harvoni       COPD (chronic obstructive pulmonary disease) (HCC)    Coronary artery disease involving native heart with angina pectoris (HCC) 05/31/2018   COVID-19    08/22/20   Dementia (HCC)    early dementia   Depression    Depression, recurrent (HCC) 06/14/2018   Dilated ascending aorta and aortic root (HCC)    a. 11/2020 Echo: Ao root  33mm, Asc Ao 38mm. Ao arch 20mm.   Diverticulitis    12/06/20 [facility]    Diverticulosis    Drug use    Dyspnea    ED (erectile dysfunction)    Elevated troponin I level 07/28/2014   Gastritis    GERD (gastroesophageal reflux disease)    Headache    occasional migraines   Hepatitis C    treated   Hernia, inguinal, bilateral 08/15/2019   Recurrent s/p repair       History of lumbar fusion    History of pancreatitis 08/15/2019   X 2    History of prostate cancer 08/15/2019    2017 s/p brachytherapy with seeds   F/u Dr. Apolinar Junes       Hyperlipidemia    Hypertension    patient denies having high blood pressure   Leg edema 01/03/2022   Lung cancer (HCC)    Malignant neoplasm of lower lobe of left lung (HCC) 05/15/2020   Memory loss 08/15/2019   Nodule of lower lobe of left lung 11/09/2019   Nodule of middle lobe of right lung 08/15/2019   Pancreatitis    x 2    Pneumonia    07/06/18    Polyp of transverse colon    Poor nutrition 06/09/2022   Premature atrial contractions    a. 04/2021 [facility]: Freq PACs w/ 8% burden.   Prostate cancer (HCC)    with seeds   PSVT (paroxysmal supraventricular tachycardia)    a. 04/2021 [facility]: Avg HR 80 (60-210). 42 SVT runs - longest 5 beats @ 116, fastest 210 x 5 beats. Freq PACs (8%).   PTSD (post-traumatic stress disorder)    Rib fracture    s/p fall 03/15/19    Right ear pain 07/18/2021   S/P lumbar fusion 12/27/2020   Sinus bradycardia 01/29/2021   Sinus disease    SOB (shortness of breath) on exertion 05/15/2020   Squamous carcinoma of lung, left (HCC) 11/26/2019   Syncope 01/29/2021   Syncope due to orthostatic hypotension 01/23/2021   Umbilical hernia without obstruction and without gangrene    Urge incontinence    Viral upper respiratory tract infection 07/18/2021    Surgical History: Past Surgical History:  Procedure Laterality Date   CARDIAC CATHETERIZATION  2006   cateract  2013   COLONOSCOPY WITH PROPOFOL N/A 10/04/2019   Procedure: COLONOSCOPY WITH PROPOFOL;  Surgeon: Midge Minium, MD;  Location[facility] ENDOSCOPY;  Service: Endoscopy;  Laterality: N/A;   CORONARY ANGIOPLASTY  2006   CORONARY STENT PLACEMENT  2006   x 1   CYSTOSCOPY W/ RETROGRADES N/A 08/08/2021   Procedure: CYSTOSCOPY WITH RETROGRADE PYELOGRAM;  Surgeon: Vanna Scotland, MD;  Location: [facility] ORS;  Service: Urology;  Laterality: N/A;   CYSTOSCOPY WITH URETHRAL DILATATION N/A 08/08/2021   Procedure: CYSTOSCOPY WITH URETHRAL DILATATION WITH  UROLUME BALLOON;  Surgeon: Vanna Scotland, MD;  Location: [facility] ORS;  Service: Urology;  Laterality: N/A;   ESOPHAGOGASTRODUODENOSCOPY N/A 12/22/2014   Procedure: ESOPHAGOGASTRODUODENOSCOPY (EGD);  Surgeon: Elnita Maxwell, MD;  Location[facility] ENDOSCOPY;  Service: Endoscopy;  Laterality: N/A;   ESOPHAGOGASTRODUODENOSCOPY (EGD) WITH PROPOFOL N/A 10/04/2019   Procedure: ESOPHAGOGASTRODUODENOSCOPY (EGD) WITH PROPOFOL;  Surgeon: Midge Minium, MD;  Location: [facility] ENDOSCOPY;  Service: Endoscopy;  Laterality: N/A;   HERNIA REPAIR  2015   left groin   INTERCOSTAL NERVE BLOCK Left 11/09/2019   Procedure: Intercostal Nerve Block;  Surgeon: Loreli Slot, MD;  Location: [facility] OR;  Service: Thoracic;  Laterality: Left;  LUMBAR FUSION     RADIOACTIVE SEED IMPLANT N/A 04/22/2016   Procedure: RADIOACTIVE SEED IMPLANT/BRACHYTHERAPY IMPLANT;  Surgeon: Vanna Scotland, MD;  Location: [facility] ORS;  Service: Urology;  Laterality: N/A;   ROBOT ASSISTED INGUINAL HERNIA REPAIR Bilateral 07/06/2018   Procedure: ROBOT ASSISTED INGUINAL HERNIA REPAIR;  Surgeon: Leafy Ro, MD;  Location: [facility] ORS;  Service: General;  Laterality: Bilateral;   TONSILLECTOMY     UMBILICAL HERNIA REPAIR N/A 07/06/2018   Procedure: LAPAROSCOPIC ROBOT ASSISTED UMBILICAL HERNIA;  Surgeon: Leafy Ro, MD;  Location: [facility] ORS;  Service: General;  Laterality: N/A;   XI ROBOTIC ASSISTED THORACOSCOPY- SEGMENTECTOMY Left 11/09/2019   Procedure: XI ROBOTIC ASSISTED THORACOSCOPY-WEDGE RESECTION LEFT LOWER LOBE;  Surgeon: Loreli Slot, MD;  Location: [facility] OR;  Service: Thoracic;  Laterality: Left;    Home Medications:  Allergies as of 08/06/2022   No Known Allergies      Medication List        Accurate as of August 05, 2022 10:03 AM. If you have any questions, ask your nurse or doctor.          albuterol 108 (90 Base) MCG/ACT inhaler Commonly known as: VENTOLIN HFA Inhale 1-2 puffs into the lungs every 6 (six) hours as  needed for wheezing or shortness of breath.   albuterol (2.5 MG/3ML) 0.083% nebulizer solution Commonly known as: PROVENTIL Take 3 mLs (2.5 mg total) by nebulization every 6 (six) hours as needed for wheezing or shortness of breath.   ARIPiprazole 10 MG tablet Commonly known as: ABILIFY TAKE 1 TABLET BY MOUTH AT BEDTIME   aspirin EC 81 MG tablet Take 81 mg by mouth daily.   celecoxib 200 MG capsule Commonly known as: CELEBREX TAKE 1 CAPSULE BY MOUTH DAILY AFTER BREAKFAST   clopidogrel 75 MG tablet Commonly known as: PLAVIX Take 1 tablet (75 mg total) by mouth daily.   donepezil 5 MG tablet Commonly known as: ARICEPT Take 1 tablet (5 mg total) by mouth at bedtime.   FLUoxetine 10 MG capsule Commonly known as: PROZAC Take 3 capsules (30 mg total) by mouth daily.   fluticasone 50 MCG/ACT nasal spray Commonly known as: FLONASE Place 2 sprays into both nostrils daily. Prn after nasal saline   memantine 10 MG tablet Commonly known as: NAMENDA Take 1 tablet (10 mg total) by mouth 2 (two) times daily.   metoprolol succinate 50 MG 24 hr tablet Commonly known as: TOPROL-XL Take 1 tablet (50 mg total) by mouth daily. Take with or immediately following a meal.   mirabegron ER 50 MG Tb24 tablet Commonly known as: Myrbetriq Take 1 tablet (50 mg total) by mouth daily.   multivitamin with minerals tablet Take 1 tablet by mouth daily.   pantoprazole 40 MG tablet Commonly known as: PROTONIX TAKE 1 TABLET (40 MG TOTAL) BY MOUTH DAILY. 30 MINUTE(S) BEFORE BREAKFAST   rosuvastatin 10 MG tablet Commonly known as: CRESTOR Take 1 tablet (10 mg total) by mouth at bedtime.   sodium chloride 0.65 % Soln nasal spray Commonly known as: OCEAN Place 2 sprays into both nostrils as needed for congestion.   sucralfate 1 g tablet Commonly known as: CARAFATE TAKE 1 TABLET BY MOUTH FOUR TIMES A DAY   tadalafil 5 MG tablet Commonly known as: Cialis Take 1 tablet (5 mg total) by mouth  daily as needed for erectile dysfunction.   tamsulosin 0.4 MG Caps capsule Commonly known as: FLOMAX Take 0.4 mg by mouth daily.   traZODone 100 MG  tablet Commonly known as: DESYREL Take 1 tablet (100 mg total) by mouth at bedtime as needed for sleep.        Allergies: No Known Allergies  Family History: Family History  Problem Relation Age of Onset   Heart disease Father        Unclear details. Father passed in late 72's 2/2 cancer.   Colon cancer Father    Alcohol abuse Sister    Drug abuse Sister    Anxiety disorder Sister    Depression Sister    COPD Brother    Obesity Brother    Kidney disease Neg Hx    Prostate cancer Neg Hx     Social History:  reports that he has been smoking cigarettes. He started smoking about 60 years ago. He has a 82.50 pack-year smoking history. He has never used smokeless tobacco. He reports current alcohol use of about 1.0 standard drink of alcohol per week. He reports current drug use. Frequency: 7.00 times per week. Drug: Marijuana.  ROS: Pertinent ROS in HPI  Physical Exam: There were no vitals taken for this visit.  Constitutional:  Well nourished. Alert and oriented, No acute distress. HEENT: [place] AT, moist mucus membranes.  Trachea midline Cardiovascular: No clubbing, cyanosis, or edema. Respiratory: Normal respiratory effort, no increased work of breathing. GU: No CVA tenderness.  No bladder fullness or masses.  Patient with circumcised/uncircumcised phallus. ***Foreskin easily retracted***  Urethral meatus is patent.  No penile discharge. No penile lesions or rashes. Scrotum without lesions, cysts, rashes and/or edema.  Testicles are located scrotally bilaterally. No masses are appreciated in the testicles. Left and right epididymis are normal. Rectal: Patient with  normal sphincter tone. Anus and perineum without scarring or rashes. No rectal masses are appreciated. Prostate is approximately *** grams, *** nodules are appreciated.  Seminal vesicles are normal. Neurologic: Grossly intact, no focal deficits, moving all 4 extremities. Psychiatric: Normal mood and affect.   Laboratory Data: N/A  Pertinent Imaging: N/A  Assessment & Plan:    1. Erectile dysfunction -He is states that his friend Cialis worked really well for him and he has prescription waiting for him at [facility], but it was too expensive -I have given him a good Rx card so that he could have the good Rx price and get the Cialis  2. BPH with LUTS -UA 3-10 RBC's -PVR < 300 cc  -most bothersome symptoms are urge incontinence -continue conservative management, avoiding bladder irritants and timed voiding's  3. Urge incontinence -many contributing factors (pelvic radiation, smoking, alcohol consumption and diuretics -UA w/ micro heme -urine is sent for culture and atypical's -discontinue the Myrbetriq 50 mg daily to see if the urge incontinence worsens after stopping the Myrbetriq  4. Prostate cancer -has follow up in 03/2023 w/ Dr. Apolinar Junes    No follow-ups on file.  These notes generated with voice recognition software. I apologize for typographical errors.  Cloretta Ned  [facility] [address]  [address] [place], [place] [address] [phone]

## 2022-08-06 ENCOUNTER — Ambulatory Visit: Payer: Medicare HMO | Admitting: Urology

## 2022-08-06 DIAGNOSIS — N3941 Urge incontinence: Secondary | ICD-10-CM

## 2022-08-07 ENCOUNTER — Encounter: Payer: Self-pay | Admitting: Urology

## 2022-08-21 ENCOUNTER — Ambulatory Visit (INDEPENDENT_AMBULATORY_CARE_PROVIDER_SITE_OTHER): Payer: 59

## 2022-08-21 ENCOUNTER — Encounter: Payer: Self-pay | Admitting: Family Medicine

## 2022-08-21 ENCOUNTER — Ambulatory Visit (INDEPENDENT_AMBULATORY_CARE_PROVIDER_SITE_OTHER): Payer: 59 | Admitting: Family Medicine

## 2022-08-21 VITALS — BP 90/60 | HR 72 | Temp 97.6°F | Ht 72.0 in | Wt 256.1 lb

## 2022-08-21 DIAGNOSIS — N529 Male erectile dysfunction, unspecified: Secondary | ICD-10-CM

## 2022-08-21 DIAGNOSIS — I700 Atherosclerosis of aorta: Secondary | ICD-10-CM | POA: Diagnosis not present

## 2022-08-21 DIAGNOSIS — G8929 Other chronic pain: Secondary | ICD-10-CM | POA: Diagnosis not present

## 2022-08-21 DIAGNOSIS — R634 Abnormal weight loss: Secondary | ICD-10-CM

## 2022-08-21 DIAGNOSIS — Z7189 Other specified counseling: Secondary | ICD-10-CM

## 2022-08-21 DIAGNOSIS — G4700 Insomnia, unspecified: Secondary | ICD-10-CM

## 2022-08-21 DIAGNOSIS — F1910 Other psychoactive substance abuse, uncomplicated: Secondary | ICD-10-CM

## 2022-08-21 DIAGNOSIS — N1831 Chronic kidney disease, stage 3a: Secondary | ICD-10-CM | POA: Diagnosis not present

## 2022-08-21 DIAGNOSIS — F0280 Dementia in other diseases classified elsewhere without behavioral disturbance: Secondary | ICD-10-CM

## 2022-08-21 DIAGNOSIS — M5136 Other intervertebral disc degeneration, lumbar region: Secondary | ICD-10-CM | POA: Diagnosis not present

## 2022-08-21 DIAGNOSIS — I2510 Atherosclerotic heart disease of native coronary artery without angina pectoris: Secondary | ICD-10-CM

## 2022-08-21 DIAGNOSIS — G301 Alzheimer's disease with late onset: Secondary | ICD-10-CM | POA: Diagnosis not present

## 2022-08-21 DIAGNOSIS — M545 Low back pain, unspecified: Secondary | ICD-10-CM | POA: Diagnosis not present

## 2022-08-21 DIAGNOSIS — C3490 Malignant neoplasm of unspecified part of unspecified bronchus or lung: Secondary | ICD-10-CM | POA: Diagnosis not present

## 2022-08-21 DIAGNOSIS — F32A Depression, unspecified: Secondary | ICD-10-CM

## 2022-08-21 DIAGNOSIS — F419 Anxiety disorder, unspecified: Secondary | ICD-10-CM

## 2022-08-21 DIAGNOSIS — F319 Bipolar disorder, unspecified: Secondary | ICD-10-CM

## 2022-08-21 LAB — COMPREHENSIVE METABOLIC PANEL WITH GFR
ALT: 13 U/L (ref 0–53)
AST: 16 U/L (ref 0–37)
Albumin: 4.2 g/dL (ref 3.5–5.2)
Alkaline Phosphatase: 81 U/L (ref 39–117)
BUN: 19 mg/dL (ref 6–23)
CO2: 29 meq/L (ref 19–32)
Calcium: 9.3 mg/dL (ref 8.4–10.5)
Chloride: 105 meq/L (ref 96–112)
Creatinine, Ser: 1.05 mg/dL (ref 0.40–1.50)
GFR: 69.19 mL/min
Glucose, Bld: 99 mg/dL (ref 70–99)
Potassium: 4.8 meq/L (ref 3.5–5.1)
Sodium: 141 meq/L (ref 135–145)
Total Bilirubin: 0.5 mg/dL (ref 0.2–1.2)
Total Protein: 6.8 g/dL (ref 6.0–8.3)

## 2022-08-21 LAB — CBC WITH DIFFERENTIAL/PLATELET
Basophils Absolute: 0.1 K/uL (ref 0.0–0.1)
Basophils Relative: 0.7 % (ref 0.0–3.0)
Eosinophils Absolute: 0.2 K/uL (ref 0.0–0.7)
Eosinophils Relative: 2.9 % (ref 0.0–5.0)
HCT: 40.4 % (ref 39.0–52.0)
Hemoglobin: 13.5 g/dL (ref 13.0–17.0)
Lymphocytes Relative: 22.5 % (ref 12.0–46.0)
Lymphs Abs: 1.7 K/uL (ref 0.7–4.0)
MCHC: 33.5 g/dL (ref 30.0–36.0)
MCV: 98 fl (ref 78.0–100.0)
Monocytes Absolute: 0.6 K/uL (ref 0.1–1.0)
Monocytes Relative: 8.3 % (ref 3.0–12.0)
Neutro Abs: 5 K/uL (ref 1.4–7.7)
Neutrophils Relative %: 65.6 % (ref 43.0–77.0)
Platelets: 247 K/uL (ref 150.0–400.0)
RBC: 4.12 Mil/uL — ABNORMAL LOW (ref 4.22–5.81)
RDW: 14.8 % (ref 11.5–15.5)
WBC: 7.6 K/uL (ref 4.0–10.5)

## 2022-08-21 MED ORDER — BACLOFEN 10 MG PO TABS: 5.0000 mg | ORAL_TABLET | Freq: Three times a day (TID) | ORAL | 0 refills | Status: DC

## 2022-08-21 NOTE — Progress Notes (Signed)
SUBJECTIVE:   Chief Complaint  Patient presents with   Follow-up    4 wk f/u   Back Pain    C/o low back pain x 4 weeks. Had lumbar fusion several years ago.   HPI Presents for follow up for mood disorder. Seen in clinic on 12/07 and fluoxetine increased from 20 mg to 30 mg daily.  Since then patient reports improvement in symptoms.  Mood has been good.  Sleeping okay.  Appetite remains unchanged.  Denies any SI/HI.  Continued EtOH use, 2 drinks daily.    Low back pain Chronic issue.  Has worsened in the past few months.  Denies any dysuria, fevers, numbness or tingling.  Denies saddle anesthesia, nighttime awakenings or incontinence of bowel or bladder.  Reports worse upon awakening and improves with walking and movement.  Taking BC powder which helps a little.  Requesting Percocets.  History of prostate and lung CA.  Reports 9 pound weight loss last month.    Poor nutrition Patient reports eats probably 1 meal a day.  Indicates he is not a good cook.  Endorses a 9 pound weight loss in 1 month.  No changes in appetite.  Consumes EtOH daily.  Endorses sleeping well.  Denies any SI/HI.  Lives alone.  Family unable to provide 3 meals a day.  He reports caring for 29-year-old grandson while daughter was at work.  His grandson reminds him when to eat.  PERTINENT PMH / PSH: Lumbar fusion Mood disorder  OBJECTIVE:  BP 90/60   Pulse 72   Temp 97.6 F (36.4 C) (Oral)   Ht 6' (1.829 m)   Wt 256 lb 2 oz (116.2 kg)   SpO2 93%   BMI 34.74 kg/m    Physical Exam Constitutional:      General: He is not in acute distress.    Appearance: Normal appearance. He is normal weight. He is not ill-appearing or toxic-appearing.  HENT:     Head: Normocephalic.  Eyes:     Conjunctiva/sclera: Conjunctivae normal.  Cardiovascular:     Rate and Rhythm: Normal rate and regular rhythm.     Pulses: Normal pulses.  Pulmonary:     Effort: Pulmonary effort is normal.     Breath sounds: Normal breath  sounds.  Abdominal:     General: Bowel sounds are normal.  Musculoskeletal:     Thoracic back: Spasms, tenderness and bony tenderness present. No swelling, edema or deformity.  Neurological:     Mental Status: He is alert. Mental status is at baseline.  Psychiatric:        Mood and Affect: Mood normal. Affect is flat.        Speech: Speech normal.        Behavior: Behavior normal. Behavior is cooperative.        Thought Content: Thought content does not include suicidal ideation. Thought content does not include suicidal plan.        Cognition and Memory: Cognition is impaired. Memory is impaired.        Judgment: Judgment normal.     ASSESSMENT/PLAN:  Chronic bilateral low back pain without sciatica Assessment & Plan: Chronic.  Stable.  Requesting Percocet for back pain.  Currently taking Celebrex 200 mg twice daily.  Has also been taking BC powder.  Indicates 9 pound weight loss in 1 month.  Suspect this is likely secondary to poor nutrition however given history of lung CA and prostate CA cannot rule out metastases to bone. Lumbar  spine x-ray Check creatinine Stop BC powder Start baclofen 5 mg 3 times daily x 30 tabs  Continue Celebrex 200 mg twice daily Start Tylenol 500 mg 3 times daily Do not think opioids appropriate given patient's history of polysubstance abuse and current EtOH use Pending x-ray results, consider further imaging  Orders: -     DG Lumbar Spine Complete; Future -     Baclofen; Take 0.5 tablets (5 mg total) by mouth 3 (three) times daily.  Dispense: 45 each; Refill: 0  Late onset Alzheimer's disease without behavioral disturbance ([place]) Assessment & Plan: Chronic.  Previously schedule appointment for follow-up with neurology 01/05.  Patient reports he has not seen neurology recently.  Could not find any documentation of visit in epic or Care Everywhere.  Patient continues to come to clinic by himself.  No family members have been with him other than his  12-year-old grandson whom he cares for intermittently. Continue Aricept 5 mg nightly Continue Namenda 10 mg twice daily Again discussed the importance of following up with neurology for declining dementia and dementia planning.  Patient agreeable to allow me to call daughter to reiterate importance of appointments. Will have CMA reach out to daughter to gain some insight as to why appointment are not.    Bipolar affective disorder, remission status unspecified ([place]) Assessment & Plan: Previously referred to behavioral health.  Declined consult given patient was dismissed from clinic previously for breech of contract. Recommended [facility] psych consult. Will follow-up with geriatrics/psych consult   Atherosclerosis of aorta [facility]) Assessment & Plan: Noted on CT chest from 11/2021 On statin therapy and tolerating well Continue Crestor 10 mg daily   Polysubstance abuse ([place]) Assessment & Plan: Continued tobacco use Continued EtOH use 2 drinks daily Encouraged cessation of both alcohol and tobacco products.   Malignant neoplasm of lung, unspecified laterality, unspecified part of lung ([place]) Assessment & Plan: Chronic.  Stable. Follows with oncology.   Stage 3a chronic kidney disease (HCC) -     Comprehensive metabolic panel  Weight loss Assessment & Plan: 11 pound weight loss over 3 months.  History of prostate CA, lung CA, COPD, EtOH use and poor nutrition.  Lives alone.  Suspect decline in memory, living alone contributing to weight loss. Offered TOC consult, patient declined.  Plan to discuss with daughter to gain some insight on living situation. CMET and CBC today Colonoscopy up-to-date, screening was discontinued. Follow-up in 4 weeks.  Orders: -     Comprehensive metabolic panel -     CBC with Differential/Platelet  Coronary artery disease involving native coronary artery of native heart without angina pectoris Assessment & Plan: Chronic.  Stable Reviewed  medications Continue ASA 81 mg daily Continue Plavix 75 mg daily Continue metoprolol 50 mg daily Follow-up with cardiology as scheduled.   Goals of care, counseling/discussion Assessment & Plan: Offered to see if could help with home care Patient reports does not feel comfortable having someone come into his home. Concern for further decline given recent weight loss and poor nutrition Discussed with patient to have daughter come to next visit to discuss concerns.    Anxiety and depression Assessment & Plan: Chronic.  Stable. Doing well with increase of Prozac to 30 mg daily. Continue current Prozac dosing Will follow-up with [facility] psych referral as patient was declined by psychiatry for previous breech of contract.       PDMP reviewed  Return in about 4 weeks (around 09/18/2022) for PCP.  Carollee Leitz, MD

## 2022-08-21 NOTE — Patient Instructions (Addendum)
It was a pleasure meeting you today. Thank you for allowing me to take part in your health care.  Our goals for today as we discussed include:  Take Tylenol 500 mg 1 tablet 3 times a day Take Baclofen half a tablet 3 times a day  Do not take BC powder as you are currently taking Celebrex and this can harm your kidneys  Will get x-rays of your back today.  Will get blood work today  Call Dr. Trena Platt office to reschedule your appointment to discuss your declining memory. Call [facility] clinic   If you have any questions or concerns, please do not hesitate to call the office at [phone].  I look forward to our next visit and until then take care and stay safe.  Regards,   Carollee Leitz, MD   [facility]

## 2022-08-24 ENCOUNTER — Encounter: Payer: Self-pay | Admitting: Family Medicine

## 2022-08-24 DIAGNOSIS — F1910 Other psychoactive substance abuse, uncomplicated: Secondary | ICD-10-CM | POA: Insufficient documentation

## 2022-08-24 DIAGNOSIS — N1831 Chronic kidney disease, stage 3a: Secondary | ICD-10-CM | POA: Insufficient documentation

## 2022-08-24 NOTE — Assessment & Plan Note (Deleted)
Chronic.  Stable.  Requesting Percocet for back pain.  Currently taking Celebrex 200 mg twice daily.  Has also been taking BC powder.  Indicates 9 pound weight loss in 1 month.  Suspect this is likely secondary to poor nutrition however given history of lung CA and prostate CA cannot rule out metastases to bone. Lumbar spine x-ray Check creatinine Stop BC powder Start baclofen 5 mg 3 times daily x 30 tabs  Continue Celebrex 200 mg twice daily Start Tylenol 500 mg 3 times daily Do not think opioids appropriate given patient's history of polysubstance abuse and current EtOH use Pending x-ray results, consider further imaging

## 2022-08-24 NOTE — Assessment & Plan Note (Signed)
Previously referred to behavioral health.  Declined consult given patient was dismissed from clinic previously for breech of contract. Recommended [facility] psych consult. Will follow-up with geriatrics/psych consult

## 2022-08-24 NOTE — Assessment & Plan Note (Addendum)
11 pound weight loss over 3 months.  History of prostate CA, lung CA, COPD, EtOH use and poor nutrition.  Lives alone.  Suspect decline in memory, living alone contributing to weight loss. Offered TOC consult, patient declined.  Plan to discuss with daughter to gain some insight on living situation. CMET and CBC today Colonoscopy up-to-date, screening was discontinued. Follow-up in 4 weeks.

## 2022-08-24 NOTE — Assessment & Plan Note (Signed)
Chronic.  Stable. Follows with oncology.

## 2022-08-24 NOTE — Assessment & Plan Note (Signed)
Chronic.  Stable. Doing well with increase of Prozac to 30 mg daily. Continue current Prozac dosing Will follow-up with [facility] psych referral as patient was declined by psychiatry for previous breech of contract.

## 2022-08-24 NOTE — Assessment & Plan Note (Signed)
Chronic.  Stable.  Requesting Percocet for back pain.  Currently taking Celebrex 200 mg twice daily.  Has also been taking BC powder.  Indicates 9 pound weight loss in 1 month.  Suspect this is likely secondary to poor nutrition however given history of lung CA and prostate CA cannot rule out metastases to bone. Lumbar spine x-ray Check creatinine Stop BC powder Start baclofen 5 mg 3 times daily x 30 tabs  Continue Celebrex 200 mg twice daily Start Tylenol 500 mg 3 times daily Do not think opioids appropriate given patient's history of polysubstance abuse and current EtOH use Pending x-ray results, consider further imaging

## 2022-08-24 NOTE — Assessment & Plan Note (Signed)
Chronic.  Stable Reviewed medications Continue ASA 81 mg daily Continue Plavix 75 mg daily Continue metoprolol 50 mg daily Follow-up with cardiology as scheduled.

## 2022-08-24 NOTE — Assessment & Plan Note (Signed)
Chronic.  Previously schedule appointment for follow-up with neurology 01/05.  Patient reports he has not seen neurology recently.  Could not find any documentation of visit in epic or Care Everywhere.  Patient continues to come to clinic by himself.  No family members have been with him other than his 76-year-old grandson whom he cares for intermittently. Continue Aricept 5 mg nightly Continue Namenda 10 mg twice daily Again discussed the importance of following up with neurology for declining dementia and dementia planning.  Patient agreeable to allow me to call daughter to reiterate importance of appointments. Will have CMA reach out to daughter to gain some insight as to why appointment are not.

## 2022-08-24 NOTE — Assessment & Plan Note (Signed)
Continued tobacco use Continued EtOH use 2 drinks daily Encouraged cessation of both alcohol and tobacco products.

## 2022-08-24 NOTE — Assessment & Plan Note (Signed)
Offered to see if could help with home care Patient reports does not feel comfortable having someone come into his home. Concern for further decline given recent weight loss and poor nutrition Discussed with patient to have daughter come to next visit to discuss concerns.

## 2022-08-24 NOTE — Assessment & Plan Note (Signed)
Noted on CT chest from 11/2021 On statin therapy and tolerating well Continue Crestor 10 mg daily

## 2022-08-25 ENCOUNTER — Telehealth: Payer: Self-pay | Admitting: Family Medicine

## 2022-08-25 NOTE — Progress Notes (Signed)
Pt was on the phone with pharmacy & asked for me to call him back.

## 2022-08-25 NOTE — Telephone Encounter (Signed)
I called patient to notify him that this medication is managed by urology. Pt aware to call the pharmacy & have them send the refill request for him to the right place.

## 2022-08-25 NOTE — Progress Notes (Unsigned)
Pt was on the phone with the pharmacy & asked me to call him back.

## 2022-08-25 NOTE — Telephone Encounter (Signed)
Prescription Request  08/25/2022  Is this a "Controlled Substance" medicine? No  LOV: 08/21/2022  What is the name of the medication or equipment? tamsulosin (FLOMAX) 0.4 MG CAPS capsule, Pharmacy called this is for patient's pill packests  Have you contacted your pharmacy to request a refill? Yes   Which pharmacy would you like this sent to?  [facility] [place], [place] [address] [place] [address] Phone: [phone] Fax: [phone]     Patient notified that their request is being sent to the clinical staff for review and that they should receive a response within 2 business days.   Please advise at [facility] [phone]

## 2022-08-27 ENCOUNTER — Other Ambulatory Visit: Payer: Self-pay

## 2022-08-27 ENCOUNTER — Telehealth: Payer: Self-pay | Admitting: Family Medicine

## 2022-08-27 DIAGNOSIS — E785 Hyperlipidemia, unspecified: Secondary | ICD-10-CM

## 2022-08-27 MED ORDER — TAMSULOSIN HCL 0.4 MG PO CAPS: 0.4000 mg | ORAL_CAPSULE | Freq: Every day | ORAL | 3 refills | Status: DC

## 2022-08-27 MED ORDER — SUCRALFATE 1 G PO TABS: 1.0000 g | ORAL_TABLET | Freq: Four times a day (QID) | ORAL | 11 refills | Status: DC

## 2022-08-27 MED ORDER — ROSUVASTATIN CALCIUM 10 MG PO TABS: 10.0000 mg | ORAL_TABLET | Freq: Every day | ORAL | 3 refills | Status: DC

## 2022-08-27 NOTE — Telephone Encounter (Signed)
sent 

## 2022-08-27 NOTE — Telephone Encounter (Signed)
Prescription Request  08/27/2022  Is this a "Controlled Substance" medicine? No  LOV: 08/21/2022  What is the name of the medication or equipment? tamsulosin (FLOMAX) 0.4 MG CAPS capsule  Have you contacted your pharmacy to request a refill? Yes   Which pharmacy would you like this sent to?   [facility] [place], [place] [address] [place] [address] Phone: [phone] Fax: [phone]     Patient notified that their request is being sent to the clinical staff for review and that they should receive a response within 2 business days.   Please advise at [facility] [phone]

## 2022-08-27 NOTE — Telephone Encounter (Signed)
Prescription Request  08/27/2022  Is this a "Controlled Substance" medicine? No  LOV: 08/21/2022  What is the name of the medication or equipment? sucralfate and rosuvastatin  Have you contacted your pharmacy to request a refill? Yes   Which pharmacy would you like this sent to?   [place]  Patient notified that their request is being sent to the clinical staff for review and that they should receive a response within 2 business days.   Please advise at Mobile [phone] (mobile)

## 2022-08-27 NOTE — Telephone Encounter (Signed)
Last visit 08/21/2022 Next visit 09/18/2022

## 2022-08-28 ENCOUNTER — Other Ambulatory Visit: Payer: Self-pay

## 2022-08-28 ENCOUNTER — Telehealth: Payer: Self-pay | Admitting: Family Medicine

## 2022-08-28 DIAGNOSIS — E785 Hyperlipidemia, unspecified: Secondary | ICD-10-CM

## 2022-08-28 MED ORDER — SUCRALFATE 1 G PO TABS: 1.0000 g | ORAL_TABLET | Freq: Four times a day (QID) | ORAL | 11 refills | Status: DC

## 2022-08-28 MED ORDER — ROSUVASTATIN CALCIUM 10 MG PO TABS: 10.0000 mg | ORAL_TABLET | Freq: Every day | ORAL | 3 refills | Status: DC

## 2022-08-28 NOTE — Telephone Encounter (Signed)
Pt requesting copy of lung cancer bx path report - mailed

## 2022-08-28 NOTE — Telephone Encounter (Signed)
Pt called staying that he needs a pathology report. He's available @[phone].

## 2022-08-28 NOTE — Telephone Encounter (Signed)
sent 

## 2022-08-29 ENCOUNTER — Telehealth: Payer: Self-pay | Admitting: Family Medicine

## 2022-08-29 DIAGNOSIS — N529 Male erectile dysfunction, unspecified: Secondary | ICD-10-CM

## 2022-08-29 MED ORDER — TADALAFIL 5 MG PO TABS: 5.0000 mg | ORAL_TABLET | Freq: Every day | ORAL | 0 refills | Status: DC | PRN

## 2022-08-29 NOTE — Telephone Encounter (Signed)
Prescription Request  08/29/2022  Is this a "Controlled Substance" medicine? No  LOV: 08/21/2022  What is the name of the medication or equipment? tadalafil (CIALIS) 5 MG tablet  Have you contacted your pharmacy to request a refill? Yes   Which pharmacy would you like this sent to?    [id number] [place] [address] [place] [address] Phone: [phone] Fax: [phone]    Patient notified that their request is being sent to the clinical staff for review and that they should receive a response within 2 business days.   Please advise at Mobile [phone] (mobile)

## 2022-09-11 LAB — HM DIABETES FOOT EXAM: HM Diabetic Foot Exam: NORMAL

## 2022-09-17 ENCOUNTER — Other Ambulatory Visit: Payer: Self-pay | Admitting: Family Medicine

## 2022-09-17 ENCOUNTER — Telehealth: Payer: Self-pay

## 2022-09-17 DIAGNOSIS — M159 Polyosteoarthritis, unspecified: Secondary | ICD-10-CM

## 2022-09-17 MED ORDER — CELECOXIB 200 MG PO CAPS: ORAL_CAPSULE | ORAL | 3 refills | Status: DC

## 2022-09-17 NOTE — Progress Notes (Signed)
SUBJECTIVE:   Chief Complaint  Patient presents with   Medical Management of Chronic Issues    Follow up   HPI Patient presents to clinic for follow-up chronic back pain  Continues to have back pain.  Ran out of Celebrex but was recently refilled.  Patient was unaware of the recent refill.  He is requesting morphine.  Recent lumbar x-ray showed severe degenerative disc at L3-4, mild degenerative disc at L1-2 and L2-3.  Retrolisthesis again noted at L3-L4.  Patient reports no appetite and weight loss. Eats 1 meal a day secondary to not knowing how to cook.  He reports he plans to go to [facility] to have something to eat after office visit.  Denies any fevers, nausea/vomiting, abdominal pain, bloody stool or hematuria.  PERTINENT PMH / PSH: Dementia CAD COPD Lung CA Tobacco use  EtOH use THC use  OBJECTIVE:  BP 118/78   Pulse 69   Temp 98.8 F (37.1 C) (Oral)   Ht 6' (1.829 m)   Wt 257 lb 12.8 oz (116.9 kg)   SpO2 92%   BMI 34.96 kg/m    Physical Exam Vitals reviewed.  Constitutional:      General: He is not in acute distress.    Appearance: Normal appearance. He is obese. He is not ill-appearing, toxic-appearing or diaphoretic.  Eyes:     General:        Right eye: No discharge.        Left eye: No discharge.  Cardiovascular:     Rate and Rhythm: Normal rate and regular rhythm.     Heart sounds: Normal heart sounds.  Pulmonary:     Effort: Pulmonary effort is normal.     Breath sounds: Normal breath sounds.  Abdominal:     General: Bowel sounds are normal.  Musculoskeletal:        General: Normal range of motion.     Cervical back: Normal range of motion.  Skin:    General: Skin is warm and dry.  Neurological:     Mental Status: He is alert and oriented to person, place, and time. Mental status is at baseline.  Psychiatric:        Mood and Affect: Mood normal.        Behavior: Behavior normal.     ASSESSMENT/PLAN:  Late onset Alzheimer's disease without  behavioral disturbance ([place]) -     Ambulatory referral to Social Work  Chronic diastolic heart failure ([place]) Assessment & Plan: Chronic.  Stable.  Euvolemic on exam. Continue metoprolol XL 50 mg daily. Follow up with cardiology.   Weight loss Assessment & Plan: Chronic.  Weight increased 1 pound since last visit. Recent labs reassuring. History of lung CA Follows with oncology Encourage smoking cessation Encourage EtOH cessation Encourage THC cessation Referral to social work for evaluation for assistance with meals given history of dementia.   Gastroesophageal reflux disease without esophagitis -     Famotidine; Take 1 tablet (20 mg total) by mouth daily.  Dispense: 90 tablet; Refill: 3  Chronic bilateral low back pain without sciatica Assessment & Plan: Chronic.  Stable.  Requesting morphine for back pain.   Continue Celebrex 200 mg twice daily Lumbar x-rays show severe DJD.    Addendum With daughter on 09/23/2022 at 10:40 AM. Reports increasing forgetfulness.  She thinks patient would benefit from in-home nurse to help with medications as well as meals.  Also endorses that patient eats outside of home and she takes them for lunches  and dinners and able. She is okay with attending appointments with patient and would like calls upcoming appointments.  She was unaware of any appointments with neurology for dementia planning.  Will plan to reschedule with Dr. Manuella Ghazi in near future.  PDMP reviewed  Return if symptoms worsen or fail to improve.  Carollee Leitz, MD

## 2022-09-17 NOTE — Patient Instructions (Incomplete)
It was a pleasure meeting you today. Thank you for allowing me to take part in your health care.  Our goals for today as we discussed include:  Your last blood work 1 month ago was normal.  Your weight has increased since your last visit.  Referral sent for someone to evaluate for need for additional resources at home to help with meals  Recommend that you and your daughter come to your next clinic visit Recommend that you and your daughter follow up with Neurology for your Dementia and advanced Dementia planning  Recommend decreasing alcohol intake Recommend decreasing cigarette smoking Recommend decreasing THC   If you have any questions or concerns, please do not hesitate to call the office at [phone].  I look forward to our next visit and until then take care and stay safe.  Regards,   Dana Allan, MD   [facility]

## 2022-09-18 ENCOUNTER — Ambulatory Visit (INDEPENDENT_AMBULATORY_CARE_PROVIDER_SITE_OTHER): Payer: 59 | Admitting: Family Medicine

## 2022-09-18 ENCOUNTER — Encounter: Payer: Self-pay | Admitting: Family Medicine

## 2022-09-18 VITALS — BP 118/78 | HR 69 | Temp 98.8°F | Ht 72.0 in | Wt 257.8 lb

## 2022-09-18 DIAGNOSIS — G8929 Other chronic pain: Secondary | ICD-10-CM

## 2022-09-18 DIAGNOSIS — F0280 Dementia in other diseases classified elsewhere without behavioral disturbance: Secondary | ICD-10-CM

## 2022-09-18 DIAGNOSIS — J449 Chronic obstructive pulmonary disease, unspecified: Secondary | ICD-10-CM

## 2022-09-18 DIAGNOSIS — I5032 Chronic diastolic (congestive) heart failure: Secondary | ICD-10-CM | POA: Diagnosis not present

## 2022-09-18 DIAGNOSIS — M545 Low back pain, unspecified: Secondary | ICD-10-CM | POA: Diagnosis not present

## 2022-09-18 DIAGNOSIS — R634 Abnormal weight loss: Secondary | ICD-10-CM

## 2022-09-18 DIAGNOSIS — G301 Alzheimer's disease with late onset: Secondary | ICD-10-CM | POA: Diagnosis not present

## 2022-09-18 DIAGNOSIS — Z720 Tobacco use: Secondary | ICD-10-CM

## 2022-09-18 DIAGNOSIS — K219 Gastro-esophageal reflux disease without esophagitis: Secondary | ICD-10-CM

## 2022-09-18 DIAGNOSIS — C3490 Malignant neoplasm of unspecified part of unspecified bronchus or lung: Secondary | ICD-10-CM

## 2022-09-18 DIAGNOSIS — Z7189 Other specified counseling: Secondary | ICD-10-CM

## 2022-09-18 MED ORDER — FAMOTIDINE 20 MG PO TABS: 20.0000 mg | ORAL_TABLET | Freq: Every day | ORAL | 3 refills | Status: DC

## 2022-09-19 NOTE — Telephone Encounter (Signed)
Rx sent 

## 2022-09-23 ENCOUNTER — Encounter: Payer: Self-pay | Admitting: Family Medicine

## 2022-09-23 DIAGNOSIS — I5032 Chronic diastolic (congestive) heart failure: Secondary | ICD-10-CM | POA: Insufficient documentation

## 2022-09-23 NOTE — Assessment & Plan Note (Signed)
Chronic.  Stable.  Euvolemic on exam. Continue metoprolol XL 50 mg daily. Follow up with cardiology.

## 2022-09-23 NOTE — Assessment & Plan Note (Signed)
Chronic.  Stable.  Requesting morphine for back pain.   Continue Celebrex 200 mg twice daily Lumbar x-rays show severe DJD.

## 2022-09-23 NOTE — Assessment & Plan Note (Signed)
Chronic.  Weight increased 1 pound since last visit. Recent labs reassuring. History of lung CA Follows with oncology Encourage smoking cessation Encourage EtOH cessation Encourage THC cessation Referral to social work for evaluation for assistance with meals given history of dementia.

## 2022-10-02 ENCOUNTER — Ambulatory Visit: Payer: 59 | Attending: Cardiovascular Disease | Admitting: Cardiovascular Disease

## 2022-10-02 ENCOUNTER — Encounter: Payer: Self-pay | Admitting: Cardiovascular Disease

## 2022-10-02 VITALS — BP 112/80 | HR 79 | Ht 72.0 in | Wt 260.8 lb

## 2022-10-02 DIAGNOSIS — Z720 Tobacco use: Secondary | ICD-10-CM

## 2022-10-02 DIAGNOSIS — J449 Chronic obstructive pulmonary disease, unspecified: Secondary | ICD-10-CM | POA: Diagnosis not present

## 2022-10-02 DIAGNOSIS — E785 Hyperlipidemia, unspecified: Secondary | ICD-10-CM | POA: Diagnosis not present

## 2022-10-02 DIAGNOSIS — I2510 Atherosclerotic heart disease of native coronary artery without angina pectoris: Secondary | ICD-10-CM | POA: Diagnosis not present

## 2022-10-02 NOTE — Patient Instructions (Signed)
Medication Instructions:  No changes *If you need a refill on your cardiac medications before your next appointment, please call your pharmacy*   Lab Work: None ordered If you have labs (blood work) drawn today and your tests are completely normal, you will receive your results only by: [place] (if you have MyChart) OR A paper copy in the mail If you have any lab test that is abnormal or we need to change your treatment, we will call you to review the results.   Testing/Procedures: None ordered   Follow-Up: At [facility], you and your health needs are our priority.  As part of our continuing mission to provide you with exceptional heart care, we have created designated Provider Care Teams.  These Care Teams include your primary Cardiologist (physician) and Advanced Practice Providers (APPs -  Physician Assistants and Nurse Practitioners) who all work together to provide you with the care you need, when you need it.  We recommend signing up for the patient portal called "MyChart".  Sign up information is provided on this After Visit Summary.  MyChart is used to connect with patients for Virtual Visits (Telemedicine).  Patients are able to view lab/test results, encounter notes, upcoming appointments, etc.  Non-urgent messages can be sent to your provider as well.   To learn more about what you can do with MyChart, go to [email or web address].    Your next appointment:   12 month(s)  Provider:   You may see Kathlyn Sacramento, MD or one of the following Advanced Practice Providers on your designated Care Team:   Murray Hodgkins, NP Christell Faith, PA-C Cadence Kathlen Mody, PA-C Gerrie Nordmann, NP

## 2022-10-02 NOTE — Progress Notes (Signed)
Cardiology Office Note   Date:  10/02/2022   ID:  Christopher Orr, DOB December 29, 1945, MRN [id number]  PCP:  Carollee Leitz, MD  Cardiologist:   Kathlyn Sacramento, MD   Chief Complaint  Patient presents with   Follow-up    6 month follow up. Patient states that he is just short winded. Med reviewed.       History of Present Illness: Christopher Orr is a 77 y.o. male who is here today for follow-up visit regarding coronary artery disease.  He has known history of coronary artery disease status post LAD stenting 10-25-04 with Cypher drug-eluting stent. He has multiple medical problems including prolonged history of tobacco use, COPD, hyperlipidemia, obesity, liver cirrhosis, excessive alcohol use and hepatitis C which was treated.  He also has mild dementia. No cardiac events since his stent placement in Oct 25, 2004.  He had a repeat cardiac catheterization in 25-Oct-2009 which showed patent stent with mild restenosis. He continues to smoke 1 pack/day.  He had a CT scan of the abdomen in 2017-10-25 that showed no evidence of aortic aneurysm.  He was diagnosed with squamous cell cancer of the lung in April of 2021 and underwent robotic assisted wedge resection of the left lower lobe.  He did not require radiation or chemotherapy.    He had a syncopal episode in June 2022.  Work-up in the ED was unremarkable syncope was in the setting of alcohol use, poor oral intake as well as Norco and benzodiazepine use.  Furosemide was subsequently discontinued.  Echocardiogram showed an EF of 50 to 55% with no significant valvular abnormalities. Outpatient monitor showed frequent PACs.  He denies chest pain but does report worsening exertional dyspnea.  He continues to smoke 1 pack/day.  He drinks whiskey daily and sometimes uses marijuana. His son died in 25-Oct-2020 of drug overdose.   Past Medical History:  Diagnosis Date   Abnormal findings on diagnostic imaging of digestive system    Acute kidney injury ([place]) 07/28/2014    Alcohol abuse    pt states it is marijuana   Anemia    Arthritis    Arthritis 08/15/2019   Aspiration pneumonia ([place]) 07/28/2014   Bilateral inguinal hernia, without obstruction or gangrene, recurrent    Bipolar disorder (HCC)    BPH (benign prostatic hypertrophy) with urinary obstruction    CAD (coronary artery disease)    a. 2004/10/25 PCI/DES to LAD, EF 60%.   Chest pain 07/28/2014   Chronic heart failure with preserved ejection fraction (HFpEF) ([place])    a. 07/2014 Echo: EF 55%; b. 11/2020 Echo: EF 50-55%, mod LVH, GrI DD, mildly enlarged RV w/ nl fxn, mild BAE.   Chronic pancreatitis ([place])    Chronic viral hepatitis C ([place]) 09/24/2014   Overview:   GT 1a.   started on Harvoni 11/03/2014 for planned 12 week course, elastography shows cirrhosis.   Cigarette smoker 08/05/2018   Cirrhosis ([place])    Cirrhosis of liver ([place]) 08/15/2019   Noted in Oct 26, 2014 imaging with alcohol abuse and h/o hep C tx s/p harvoni       COPD (chronic obstructive pulmonary disease) ([place])    Coronary artery disease involving native heart with angina pectoris ([place]) 05/31/2018   COVID-19    08/22/20   Dementia ([place])    early dementia   Depression    Depression, recurrent ([place]) 06/14/2018   Dilated ascending aorta and aortic root ([place])    a. 11/2020 Echo: Ao root 7m, Asc Ao 458m  Ao arch 48m.   Diverticulitis    [facility]    Diverticulosis    Drug use    Dyspnea    ED (erectile dysfunction)    Elevated troponin I level 07/28/2014   Gastritis    GERD (gastroesophageal reflux disease)    Headache    occasional migraines   Hepatitis C    treated   Hernia, inguinal, bilateral 08/15/2019   Recurrent s/p repair       History of lumbar fusion    History of pancreatitis 08/15/2019   X 2    History of prostate cancer 08/15/2019   2017 s/p brachytherapy with seeds   F/u Dr. BErlene Quan      Hyperlipidemia    Hypertension    patient denies having high blood pressure   Internal hemorrhoids 01/29/2021   Leg edema  01/03/2022   [place]    Malignant neoplasm of lower lobe of left lung ([place] 05/15/2020   Memory loss 08/15/2019   Nodule of lower lobe of left lung 11/09/2019   Nodule of middle lobe of right lung 08/15/2019   Other chronic pain 01/29/2021   Pancreatitis    x 2    Pneumonia    07/06/18    Polyp of transverse colon    Poor nutrition 06/09/2022   Premature atrial contractions    a. 04/2021 Zio: Freq PACs w/ 8% burden.   Prostate cancer ([place]    with seeds   PSVT (paroxysmal supraventricular tachycardia)    a. 04/2021 Zio: Avg HR 80 (60-210). 42 SVT runs - longest 5 beats @ 116, fastest 210 x 5 beats. Freq PACs (8%).   PTSD (post-traumatic stress disorder)    Rib fracture    s/p fall 03/15/19    Right ear pain 07/18/2021   S/P lumbar fusion 12/27/2020   Sinus bradycardia 01/29/2021   Sinus disease    SOB (shortness of breath) on exertion 05/15/2020   Squamous carcinoma of lung, left ([place] 11/26/2019   Syncope 01/29/2021   Syncope due to orthostatic hypotension [id number]  Umbilical hernia without obstruction and without gangrene    Urge incontinence    Viral upper respiratory tract infection 07/18/2021    Past Surgical History:  Procedure Laterality Date   CARDIAC CATHETERIZATION  2006   cateract  2013   COLONOSCOPY WITH PROPOFOL N/A 10/04/2019   Procedure: COLONOSCOPY WITH PROPOFOL;  Surgeon: WLucilla Lame MD;  [facility]  Service: Endoscopy;  Laterality: N/A;   CORONARY ANGIOPLASTY  2006   CORONARY STENT PLACEMENT  2006   x 1   CYSTOSCOPY W/ RETROGRADES N/A 08/08/2021   Procedure: CYSTOSCOPY WITH RETROGRADE PYELOGRAM;  Surgeon: BHollice Espy MD;  Location: [facility] ORS;  Service: Urology;  Laterality: N/A;   CYSTOSCOPY WITH URETHRAL DILATATION N/A 08/08/2021   Procedure: CYSTOSCOPY WITH URETHRAL DILATATION WITH UROLUME BALLOON;  Surgeon: BHollice Espy MD;  Location: [facility] ORS;  Service: Urology;  Laterality: N/A;   ESOPHAGOGASTRODUODENOSCOPY N/A  12/22/2014   Procedure: ESOPHAGOGASTRODUODENOSCOPY (EGD);  Surgeon: MJosefine Class MD;  [facility];  Service: Endoscopy;  Laterality: N/A;   ESOPHAGOGASTRODUODENOSCOPY (EGD) WITH PROPOFOL N/A 10/04/2019   Procedure: ESOPHAGOGASTRODUODENOSCOPY (EGD) WITH PROPOFOL;  Surgeon: WLucilla Lame MD;  Location: [facility] ENDOSCOPY;  Service: Endoscopy;  Laterality: N/A;   HERNIA REPAIR  2015   left groin   INTERCOSTAL NERVE BLOCK Left 11/09/2019   Procedure: Intercostal Nerve Block;  Surgeon: HMelrose Nakayama MD;  [facility]  Service: Thoracic;  Laterality: Left;   LUMBAR FUSION     RADIOACTIVE SEED IMPLANT N/A 04/22/2016   Procedure: RADIOACTIVE SEED IMPLANT/BRACHYTHERAPY IMPLANT;  Surgeon: Hollice Espy, MD;  Location: [facility] ORS;  Service: Urology;  Laterality: N/A;   ROBOT ASSISTED INGUINAL HERNIA REPAIR Bilateral 07/06/2018   Procedure: ROBOT ASSISTED INGUINAL HERNIA REPAIR;  Surgeon: Jules Husbands, MD;  Location: [facility];  Service: General;  Laterality: Bilateral;   TONSILLECTOMY     UMBILICAL HERNIA REPAIR N/A 07/06/2018   Procedure: LAPAROSCOPIC ROBOT ASSISTED UMBILICAL HERNIA;  Surgeon: Jules Husbands, MD;  Location: [facility];  Service: General;  Laterality: N/A;   XI ROBOTIC ASSISTED THORACOSCOPY- SEGMENTECTOMY Left 11/09/2019   Procedure: XI ROBOTIC ASSISTED THORACOSCOPY-WEDGE RESECTION LEFT LOWER LOBE;  Surgeon: Melrose Nakayama, MD;  [facility];  Service: Thoracic;  Laterality: Left;     Current Outpatient Medications  Medication Sig Dispense Refill   albuterol (PROVENTIL) (2.5 MG/3ML) 0.083% nebulizer solution Take 3 mLs (2.5 mg total) by nebulization every 6 (six) hours as needed for wheezing or shortness of breath. 375 mL 3   albuterol (VENTOLIN HFA) 108 (90 Base) MCG/ACT inhaler Inhale 1-2 puffs into the lungs every 6 (six) hours as needed for wheezing or shortness of breath. 18 g 12   ARIPiprazole (ABILIFY) 10 MG tablet TAKE 1 TABLET BY MOUTH AT  BEDTIME 90 tablet 3   aspirin EC 81 MG tablet Take 81 mg by mouth daily.     baclofen (LIORESAL) 10 MG tablet Take 0.5 tablets (5 mg total) by mouth 3 (three) times daily. 45 each 0   celecoxib (CELEBREX) 200 MG capsule Take 1 capsule daily after breakfast 28 capsule 3   clopidogrel (PLAVIX) 75 MG tablet Take 1 tablet (75 mg total) by mouth daily. 90 tablet 3   donepezil (ARICEPT) 5 MG tablet Take 1 tablet (5 mg total) by mouth at bedtime. 90 tablet 3   famotidine (PEPCID) 20 MG tablet Take 1 tablet (20 mg total) by mouth daily. 90 tablet 3   FLUoxetine (PROZAC) 10 MG capsule Take 3 capsules (30 mg total) by mouth daily. 90 capsule 3   fluticasone (FLONASE) 50 MCG/ACT nasal spray Place 2 sprays into both nostrils daily. Prn after nasal saline 16 g 11   furosemide (LASIX) 20 MG tablet Take by mouth.     memantine (NAMENDA) 10 MG tablet Take 1 tablet (10 mg total) by mouth 2 (two) times daily. 180 tablet 3   metoprolol succinate (TOPROL-XL) 50 MG 24 hr tablet Take 1 tablet (50 mg total) by mouth daily. Take with or immediately following a meal. 90 tablet 3   mirabegron ER (MYRBETRIQ) 50 MG TB24 tablet Take 1 tablet (50 mg total) by mouth daily. 90 tablet 3   Multiple Vitamins-Minerals (MULTIVITAMIN WITH MINERALS) tablet Take 1 tablet by mouth daily.     ondansetron (ZOFRAN-ODT) 4 MG disintegrating tablet      pantoprazole (PROTONIX) 40 MG tablet TAKE 1 TABLET (40 MG TOTAL) BY MOUTH DAILY. 30 MINUTE(S) BEFORE BREAKFAST 90 tablet 3   rosuvastatin (CRESTOR) 10 MG tablet Take 1 tablet (10 mg total) by mouth at bedtime. 90 tablet 3   sodium chloride (OCEAN) 0.65 % SOLN nasal spray Place 2 sprays into both nostrils as needed for congestion. 30 mL 11   sucralfate (CARAFATE) 1 g tablet Take 1 tablet (1 g total) by mouth 4 (four) times daily. 120 tablet 11   tadalafil (CIALIS) 5 MG tablet Take 1 tablet (5 mg total) by  mouth daily as needed for erectile dysfunction. 15 tablet 0   tamsulosin (FLOMAX) 0.4 MG  CAPS capsule Take 1 capsule (0.4 mg total) by mouth daily. 30 capsule 3   traZODone (DESYREL) 100 MG tablet Take 1 tablet (100 mg total) by mouth at bedtime as needed for sleep. 90 tablet 3   levofloxacin (LEVAQUIN) 750 MG tablet TAKE 1 TABLET (750 MG TOTAL) BY MOUTH DAILY. WITH FOOD (Patient not taking: Reported on 10/02/2022)     No current facility-administered medications for this visit.    Allergies:   Patient has no known allergies.    Social History:  The patient  reports that he has been smoking cigarettes. He started smoking about 60 years ago. He has a 82.50 pack-year smoking history. He has never used smokeless tobacco. He reports current alcohol use of about 1.0 standard drink of alcohol per week. He reports current drug use. Frequency: 7.00 times per week. Drug: Marijuana.   Family History:  The patient's family history includes Alcohol abuse in his sister; Anxiety disorder in his sister; COPD in his brother; Colon cancer in his father; Depression in his sister; Drug abuse in his sister; Heart disease in his father; Obesity in his brother.    ROS:  Please see the history of present illness.   Otherwise, review of systems are positive for none.   All other systems are reviewed and negative.    PHYSICAL EXAM: VS:  BP 112/80 (BP Location: Left Arm, Patient Position: Sitting, Cuff Size: Large)   Pulse 79   Ht 6' (1.829 m)   Wt 260 lb 12.8 oz (118.3 kg)   SpO2 92%   BMI 35.37 kg/m  , BMI Body mass index is 35.37 kg/m. GEN: Well nourished, well developed, in no acute distress  HEENT: normal  Neck: no JVD, carotid bruits, or masses Cardiac: RRR with premature beats; no murmurs, rubs, or gallops,no edema.   Respiratory:  clear to auscultation bilaterally with diminished breath sounds bilaterally, normal work of breathing GI: soft, nontender, nondistended, + BS MS: no deformity or atrophy  Skin: warm and dry, no rash Neuro:  Strength and sensation are intact Psych: euthymic  mood, full affect   EKG:  EKG is ordered today. The ekg ordered today demonstrates normal sinus rhythm with left anterior fascicular block.  Poor R wave progression in the anterior leads.   Recent Labs: 05/27/2022: TSH 0.48 08/21/2022: ALT 13; BUN 19; Creatinine, Ser 1.05; Hemoglobin 13.5; Platelets 247.0; Potassium 4.8; Sodium 141    Lipid Panel    Component Value Date/Time   CHOL 98 07/18/2021 1043   TRIG 146.0 07/18/2021 1043   HDL 43.80 07/18/2021 1043   CHOLHDL 2 07/18/2021 1043   VLDL 29.2 07/18/2021 1043   LDLCALC 25 07/18/2021 1043      Wt Readings from Last 3 Encounters:  10/02/22 260 lb 12.8 oz (118.3 kg)  09/18/22 257 lb 12.8 oz (116.9 kg)  08/21/22 256 lb 2 oz (116.2 kg)          03/24/2019    8:29 AM  PAD Screen  Previous PAD dx? No  Previous surgical procedure? No  Pain with walking? No  Feet/toe relief with dangling? No  Painful, non-healing ulcers? No  Extremities discolored? No      ASSESSMENT AND PLAN:  1.  Coronary artery disease involving native coronary arteries without angina: He is doing well overall with no anginal symptoms.  Continue medical therapy.  Given that he has a first generation  drug-eluting stent, continue dual antiplatelet therapy indefinitely as tolerated.    2.  Hyperlipidemia: Continue treatment with rosuvastatin.  I reviewed most recent lipid profile done in December which showed an LDL of 25.  3.  Tobacco use: I again discussed with him the importance of smoking cessation but he reports inability to quit.  4.  COPD: His worsening exertional dyspnea is likely due to COPD in the setting of continued tobacco use.  He is on inhalers.  5.  Mild dementia: Currently on Aricept and Namenda.  6.  Excessive alcohol use: He enjoys drinking whiskey daily to help with his nerves and sometimes uses marijuana.  Not able to quit.    Disposition:   FU with me in 12 months  Signed,  Kathlyn Sacramento, MD  10/02/2022 11:40 AM    [facility]

## 2022-10-03 NOTE — Addendum Note (Signed)
Addended by: James Ivanoff D on: 10/03/2022 04:38 PM   Modules accepted: Orders

## 2022-10-31 ENCOUNTER — Encounter: Payer: Self-pay | Admitting: Pulmonary Disease

## 2022-11-12 ENCOUNTER — Other Ambulatory Visit: Payer: Self-pay | Admitting: Family Medicine

## 2022-12-11 ENCOUNTER — Other Ambulatory Visit: Payer: Self-pay | Admitting: Medical

## 2022-12-11 ENCOUNTER — Other Ambulatory Visit: Payer: Self-pay | Admitting: Family Medicine

## 2022-12-15 ENCOUNTER — Telehealth: Payer: Self-pay | Admitting: Family Medicine

## 2022-12-15 NOTE — Telephone Encounter (Signed)
[facility] pharmacy called stating pt need a refill on traZODone  Fax [phone]

## 2022-12-16 MED ORDER — TRAZODONE HCL 100 MG PO TABS: 100.0000 mg | ORAL_TABLET | Freq: Every evening | ORAL | 0 refills | Status: DC | PRN

## 2022-12-16 NOTE — Addendum Note (Signed)
Addended by: Glori Luis on: 12/16/2022 08:50 AM   Modules accepted: Orders

## 2022-12-16 NOTE — Telephone Encounter (Signed)
Covering for Dr Clent Ridges. Trazodone sent to pharmacy.

## 2022-12-30 ENCOUNTER — Ambulatory Visit: Payer: 59 | Attending: Oncology

## 2023-01-06 ENCOUNTER — Inpatient Hospital Stay: Payer: 59

## 2023-01-06 ENCOUNTER — Inpatient Hospital Stay: Payer: 59 | Attending: Oncology | Admitting: Oncology

## 2023-01-06 ENCOUNTER — Encounter: Payer: Self-pay | Admitting: Oncology

## 2023-01-06 VITALS — BP 114/73 | HR 87 | Temp 98.3°F | Wt 244.0 lb

## 2023-01-06 DIAGNOSIS — Z85118 Personal history of other malignant neoplasm of bronchus and lung: Secondary | ICD-10-CM | POA: Insufficient documentation

## 2023-01-06 DIAGNOSIS — Z08 Encounter for follow-up examination after completed treatment for malignant neoplasm: Secondary | ICD-10-CM | POA: Diagnosis present

## 2023-01-06 DIAGNOSIS — C3492 Malignant neoplasm of unspecified part of left bronchus or lung: Secondary | ICD-10-CM

## 2023-01-06 DIAGNOSIS — R634 Abnormal weight loss: Secondary | ICD-10-CM

## 2023-01-06 LAB — COMPREHENSIVE METABOLIC PANEL
ALT: 12 U/L (ref 0–44)
AST: 15 U/L (ref 15–41)
Albumin: 3.6 g/dL (ref 3.5–5.0)
Alkaline Phosphatase: 75 U/L (ref 38–126)
Anion gap: 8 (ref 5–15)
BUN: 15 mg/dL (ref 8–23)
CO2: 27 mmol/L (ref 22–32)
Calcium: 9 mg/dL (ref 8.9–10.3)
Chloride: 102 mmol/L (ref 98–111)
Creatinine, Ser: 1.25 mg/dL — ABNORMAL HIGH (ref 0.61–1.24)
GFR, Estimated: 60 mL/min — ABNORMAL LOW (ref >60)
Glucose, Bld: 124 mg/dL — ABNORMAL HIGH (ref 70–99)
Potassium: 4.2 mmol/L (ref 3.5–5.1)
Sodium: 137 mmol/L (ref 135–145)
Total Bilirubin: 0.2 mg/dL — ABNORMAL LOW (ref 0.3–1.2)
Total Protein: 7.3 g/dL (ref 6.5–8.1)

## 2023-01-06 LAB — CBC WITH DIFFERENTIAL/PLATELET
Abs Immature Granulocytes: 0.02 10*3/uL (ref 0.00–0.07)
Basophils Absolute: 0.1 10*3/uL (ref 0.0–0.1)
Basophils Relative: 1 %
Eosinophils Absolute: 0.3 10*3/uL (ref 0.0–0.5)
Eosinophils Relative: 3 %
HCT: 39.5 % (ref 39.0–52.0)
Hemoglobin: 13.1 g/dL (ref 13.0–17.0)
Immature Granulocytes: 0 %
Lymphocytes Relative: 18 %
Lymphs Abs: 1.6 10*3/uL (ref 0.7–4.0)
MCH: 31.9 pg (ref 26.0–34.0)
MCHC: 33.2 g/dL (ref 30.0–36.0)
MCV: 96.1 fL (ref 80.0–100.0)
Monocytes Absolute: 0.8 10*3/uL (ref 0.1–1.0)
Monocytes Relative: 9 %
Neutro Abs: 6.4 10*3/uL (ref 1.7–7.7)
Neutrophils Relative %: 69 %
Platelets: 313 10*3/uL (ref 150–400)
RBC: 4.11 MIL/uL — ABNORMAL LOW (ref 4.22–5.81)
RDW: 13.5 % (ref 11.5–15.5)
WBC: 9.2 10*3/uL (ref 4.0–10.5)
nRBC: 0 % (ref 0.0–0.2)

## 2023-01-06 NOTE — Progress Notes (Signed)
Nutrition Assessment   Reason for Assessment:   Weight loss-add on today   ASSESSMENT:  77 year old male with history of lung cancer currently under surveillance. Past medical history of wedge resection, etoh use, CAD, COPD, hep C, mild dementia.  Most recent scan with no signs of recurrence.  Planning repeat scan  Met with patient following MD visit.  Patient reports decreased appetite. Does not cook but mainly heats food in microwave. Lives alone.  Says that he has eaten cereal this am.  Usually has frozen meal arounc 3-4 pm. States that he has a hard time remembering things.  Can't remember what he ate yesterday.  Says that he drinks 3-4 beers a day. Reports regular bowel movement, some nausea at times.       Medications: carafate, namenda, pepcid, aricept, MVI, lasix   Labs: glucose 124, creatinine 1.25   Anthropometrics:   Height: 72 inches Weight: 244 lb today UBW: 260 lb on 3/7 BMI: 33  6% weight loss in the last 3 months    NUTRITION DIAGNOSIS: Unintentional weight loss related to decreased appetite, Etoh use, ?dementia as evidenced by 6% weight loss in the last 3 months   INTERVENTION:  Encouraged patient to write schedule/reminder to eat q 2 hours and place in visible location in house.  Discussed foods high in calories and protein. Handout provided.   Discussed easy to prepare foods.  Grocery list written for patient.  Samples of ensure/boost shakes provided along with coupons.   Contact information provided   MONITORING, EVALUATION, GOAL: weight trends, intake   Next Visit: RD available as needed  Adarian Bur B. Freida Busman, RD, LDN Registered Dietitian [phone]

## 2023-01-06 NOTE — Progress Notes (Signed)
Hematology/Oncology Consult note [facility]  [phone] [phone]  Patient Care Team: Dana Allan, MD as PCP - General (Family Medicine) Iran Ouch, MD as PCP - Cardiology (Cardiology) Lanier Prude, MD as PCP - Electrophysiology (Cardiology) Glory Buff, RN as Oncology Nurse Navigator Otho Ket, RN as [facility], [place], [place] as Social Worker   Name of the patient: Christopher Orr  [id number]  04-24-1946   Date of visit: 01/06/23  Diagnosis-history of stage I lung cancer s/p surgery  Chief complaint/ Reason for visit-routine surveillance visit for lung cancer  Heme/Onc history: Patient is a 77 year old male with past medical history significant for hypertension, hepatitis C, cirrhosis, COPD among other medical problems.  He is not on any baseline oxygen.  Patient underwent CT chest without contrast which showed a 1 cm left lower lobe pulmonary nodule concerning for bronchogenic carcinoma which was new as compared to his prior scan in 2006.  No thoracic adenopathy.    PET CT scan in February 2021 showed hypermetabolic left lower lobe lesion measuring 1.2 x 1.2 cm.  No evidence of intrathoracic adenopathy.  Also noted to have hypermetabolic lesion in the left parotid gland 1.7 x 2 cm.  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  Patient underwent wedge resection of the left lower lobe lung nodule in April 2021 which showed a 1.6 cm squamous cell carcinoma of.  No involvement of visceral.  Margins negative. 5 lymph nodes negative for malignancy.  Patient did not require any adjuvant chemotherapy and is currently in remission under surveillance    Interval history- Patient reports fatigue and loss of appetite.  He has lost about 16 pounds in the last 3 months.  He has baseline exertional shortness of breath which is unchanged  ECOG PS- 2 Pain  scale- 0  Review of systems- Review of Systems  Constitutional:  Positive for malaise/fatigue and weight loss. Negative for chills and fever.  HENT:  Negative for congestion, ear discharge and nosebleeds.   Eyes:  Negative for blurred vision.  Respiratory:  Negative for cough, hemoptysis, sputum production, shortness of breath and wheezing.   Cardiovascular:  Negative for chest pain, palpitations, orthopnea and claudication.  Gastrointestinal:  Negative for abdominal pain, blood in stool, constipation, diarrhea, heartburn, melena, nausea and vomiting.  Genitourinary:  Negative for dysuria, flank pain, frequency, hematuria and urgency.  Musculoskeletal:  Negative for back pain, joint pain and myalgias.  Skin:  Negative for rash.  Neurological:  Negative for dizziness, tingling, focal weakness, seizures, weakness and headaches.  Endo/Heme/Allergies:  Does not bruise/bleed easily.  Psychiatric/Behavioral:  Negative for depression and suicidal ideas. The patient does not have insomnia.       No Known Allergies   Past Medical History:  Diagnosis Date   Abnormal findings on diagnostic imaging of digestive system    Acute kidney injury (HCC) 07/28/2014   Alcohol abuse    pt states it is marijuana   Anemia    Arthritis    Arthritis 08/15/2019   Aspiration pneumonia (HCC) 07/28/2014   Bilateral inguinal hernia, without obstruction or gangrene, recurrent    Bipolar disorder (HCC)    BPH (benign prostatic hypertrophy) with urinary obstruction    CAD (coronary artery disease)    a. 2006 PCI/DES to LAD, EF 60%.   Chest pain 07/28/2014   Chronic heart failure with preserved ejection fraction (HFpEF) (HCC)    a.  07/2014 Echo: EF 55%; b. 11/2020 Echo: EF 50-55%, mod LVH, GrI DD, mildly enlarged RV w/ nl fxn, mild BAE.   Chronic pancreatitis (HCC)    Chronic viral hepatitis C (HCC) 09/24/2014   Overview:   GT 1a.   started on Harvoni 11/03/2014 for planned 12 week course, elastography shows  cirrhosis.   Cigarette smoker 08/05/2018   Cirrhosis (HCC)    Cirrhosis of liver (HCC) 08/15/2019   Noted in 2016 imaging with alcohol abuse and h/o hep C tx s/p harvoni       COPD (chronic obstructive pulmonary disease) (HCC)    Coronary artery disease involving native heart with angina pectoris (HCC) 05/31/2018   COVID-19    08/22/20   Dementia (HCC)    early dementia   Depression    Depression, recurrent (HCC) 06/14/2018   Dilated ascending aorta and aortic root (HCC)    a. 11/2020 Echo: Ao root 44mm, Asc Ao 40mm. Ao arch 37mm.   Diverticulitis    12/06/20 [facility]    Diverticulosis    Drug use    Dyspnea    ED (erectile dysfunction)    Elevated troponin I level 07/28/2014   Gastritis    GERD (gastroesophageal reflux disease)    Headache    occasional migraines   Hepatitis C    treated   Hernia, inguinal, bilateral 08/15/2019   Recurrent s/p repair       History of lumbar fusion    History of pancreatitis 08/15/2019   X 2    History of prostate cancer 08/15/2019   2017 s/p brachytherapy with seeds   F/u Dr. Apolinar Junes       Hyperlipidemia    Hypertension    patient denies having high blood pressure   Internal hemorrhoids 01/29/2021   Leg edema 01/03/2022   Lung cancer (HCC)    Malignant neoplasm of lower lobe of left lung (HCC) 05/15/2020   Memory loss 08/15/2019   Nodule of lower lobe of left lung 11/09/2019   Nodule of middle lobe of right lung 08/15/2019   Other chronic pain 01/29/2021   Pancreatitis    x 2    Pneumonia    07/06/18    Polyp of transverse colon    Poor nutrition 06/09/2022   Premature atrial contractions    a. 04/2021 Zio: Freq PACs w/ 8% burden.   Prostate cancer (HCC)    with seeds   PSVT (paroxysmal supraventricular tachycardia)    a. 04/2021 Zio: Avg HR 80 (60-210). 42 SVT runs - longest 5 beats @ 116, fastest 210 x 5 beats. Freq PACs (8%).   PTSD (post-traumatic stress disorder)    Rib fracture    s/p fall 03/15/19    Right ear pain  07/18/2021   S/P lumbar fusion 12/27/2020   Sinus bradycardia 01/29/2021   Sinus disease    SOB (shortness of breath) on exertion 05/15/2020   Squamous carcinoma of lung, left (HCC) 11/26/2019   Syncope 01/29/2021   Syncope due to orthostatic hypotension 01/23/2021   Umbilical hernia without obstruction and without gangrene    Urge incontinence    Viral upper respiratory tract infection 07/18/2021     Past Surgical History:  Procedure Laterality Date   CARDIAC CATHETERIZATION  2006   cateract  2013   COLONOSCOPY WITH PROPOFOL N/A 10/04/2019   Procedure: COLONOSCOPY WITH PROPOFOL;  Surgeon: Midge Minium, MD;  Location: [facility] ENDOSCOPY;  Service: Endoscopy;  Laterality: N/A;   CORONARY ANGIOPLASTY  2006  CORONARY STENT PLACEMENT  2006   x 1   CYSTOSCOPY W/ RETROGRADES N/A 08/08/2021   Procedure: CYSTOSCOPY WITH RETROGRADE PYELOGRAM;  Surgeon: Vanna Scotland, MD;  Location: [facility] ORS;  Service: Urology;  Laterality: N/A;   CYSTOSCOPY WITH URETHRAL DILATATION N/A 08/08/2021   Procedure: CYSTOSCOPY WITH URETHRAL DILATATION WITH UROLUME BALLOON;  Surgeon: Vanna Scotland, MD;  Location: [facility] ORS;  Service: Urology;  Laterality: N/A;   ESOPHAGOGASTRODUODENOSCOPY N/A 12/22/2014   Procedure: ESOPHAGOGASTRODUODENOSCOPY (EGD);  Surgeon: Elnita Maxwell, MD;  Location: [facility] ENDOSCOPY;  Service: Endoscopy;  Laterality: N/A;   ESOPHAGOGASTRODUODENOSCOPY (EGD) WITH PROPOFOL N/A 10/04/2019   Procedure: ESOPHAGOGASTRODUODENOSCOPY (EGD) WITH PROPOFOL;  Surgeon: Midge Minium, MD;  Location: [facility] ENDOSCOPY;  Service: Endoscopy;  Laterality: N/A;   HERNIA REPAIR  2015   left groin   INTERCOSTAL NERVE BLOCK Left 11/09/2019   Procedure: Intercostal Nerve Block;  Surgeon: Loreli Slot, MD;  Location: [facility] OR;  Service: Thoracic;  Laterality: Left;   LUMBAR FUSION     RADIOACTIVE SEED IMPLANT N/A 04/22/2016   Procedure: RADIOACTIVE SEED IMPLANT/BRACHYTHERAPY IMPLANT;  Surgeon: Vanna Scotland, MD;   Location: [facility] ORS;  Service: Urology;  Laterality: N/A;   ROBOT ASSISTED INGUINAL HERNIA REPAIR Bilateral 07/06/2018   Procedure: ROBOT ASSISTED INGUINAL HERNIA REPAIR;  Surgeon: Leafy Ro, MD;  Location: [facility] ORS;  Service: General;  Laterality: Bilateral;   TONSILLECTOMY     UMBILICAL HERNIA REPAIR N/A 07/06/2018   Procedure: LAPAROSCOPIC ROBOT ASSISTED UMBILICAL HERNIA;  Surgeon: Leafy Ro, MD;  Location: [facility] ORS;  Service: General;  Laterality: N/A;   XI ROBOTIC ASSISTED THORACOSCOPY- SEGMENTECTOMY Left 11/09/2019   Procedure: XI ROBOTIC ASSISTED THORACOSCOPY-WEDGE RESECTION LEFT LOWER LOBE;  Surgeon: Loreli Slot, MD;  Location: [facility] OR;  Service: Thoracic;  Laterality: Left;    Social History   Socioeconomic History   Marital status: Divorced    Spouse name: Not on file   Number of children: 4   Years of education: Not on file   Highest education level: GED or equivalent  Occupational History   Occupation: Curator    Comment: retired  Tobacco Use   Smoking status: Every Day    Packs/day: 1.50    Years: 55.00    Additional pack years: 0.00    Total pack years: 82.50    Types: Cigarettes    Start date: 07/28/1962   Smokeless tobacco: Never   Tobacco comments:    1PPD 01/30/2022  Vaping Use   Vaping Use: Never used  Substance and Sexual Activity   Alcohol use: Yes    Alcohol/week: 1.0 standard drink of alcohol    Types: 1 Cans of beer per week    Comment: 2-3 beers a day   Drug use: Yes    Frequency: 7.0 times per week    Types: Marijuana    Comment: Endorsed heroin 06/2014, UDS also + benzos, opiates, THC, neg for cocaine at that time.   Sexual activity: Not Currently  Other Topics Concern   Not on file  Social History Narrative   Lives at home    Has kids and grandkids   Smoker x 55 years 1 ppd as of 07/2019 he did quit x 2 years    Drinks 1 pt liq qd as of 07/2019    Social Determinants of Health   Financial Resource Strain: Low  Risk  (04/29/2022)   Overall Financial Resource Strain (CARDIA)    Difficulty of Paying Living Expenses: Not hard at  all  Food Insecurity: Food Insecurity Present (06/25/2022)   Hunger Vital Sign    Worried About Running Out of Food in the Last Year: Sometimes true    Ran Out of Food in the Last Year: Sometimes true  Transportation Needs: No Transportation Needs (06/25/2022)   PRAPARE - Administrator, Civil Service (Medical): No    Lack of Transportation (Non-Medical): No  Physical Activity: Inactive (12/25/2017)   Exercise Vital Sign    Days of Exercise per Week: 0 days    Minutes of Exercise per Session: 0 min  Stress: No Stress Concern Present (06/25/2022)   [employer] of Occupational Health - Occupational Stress Questionnaire    Feeling of Stress : Only a little  Social Connections: Moderately Isolated (04/17/2021)   Social Connection and Isolation Panel [NHANES]    Frequency of Communication with Friends and Family: More than three times a week    Frequency of Social Gatherings with Friends and Family: Once a week    Attends Religious Services: Never    Database administrator or Organizations: Yes    Attends Engineer, structural: More than 4 times per year    Marital Status: Divorced  Intimate Partner Violence: Not At Risk (04/29/2022)   Humiliation, Afraid, Rape, and Kick questionnaire    Fear of Current or Ex-Partner: No    Emotionally Abused: No    Physically Abused: No    Sexually Abused: No    Family History  Problem Relation Age of Onset   Heart disease Father        Unclear details. Father passed in late 39's 2/2 cancer.   Colon cancer Father    Alcohol abuse Sister    Drug abuse Sister    Anxiety disorder Sister    Depression Sister    COPD Brother    Obesity Brother    Kidney disease Neg Hx    Prostate cancer Neg Hx      Current Outpatient Medications:    albuterol (PROVENTIL) (2.5 MG/3ML) 0.083% nebulizer solution, Take 3 mLs  (2.5 mg total) by nebulization every 6 (six) hours as needed for wheezing or shortness of breath., Disp: 375 mL, Rfl: 3   albuterol (VENTOLIN HFA) 108 (90 Base) MCG/ACT inhaler, Inhale 1-2 puffs into the lungs every 6 (six) hours as needed for wheezing or shortness of breath., Disp: 18 g, Rfl: 12   ARIPiprazole (ABILIFY) 10 MG tablet, TAKE 1 TABLET BY MOUTH AT BEDTIME, Disp: 90 tablet, Rfl: 3   aspirin EC 81 MG tablet, Take 81 mg by mouth daily., Disp: , Rfl:    baclofen (LIORESAL) 10 MG tablet, Take 0.5 tablets (5 mg total) by mouth 3 (three) times daily., Disp: 45 each, Rfl: 0   celecoxib (CELEBREX) 200 MG capsule, Take 1 capsule daily after breakfast, Disp: 28 capsule, Rfl: 3   clopidogrel (PLAVIX) 75 MG tablet, Take 1 tablet (75 mg total) by mouth daily., Disp: 90 tablet, Rfl: 3   donepezil (ARICEPT) 5 MG tablet, Take 1 tablet (5 mg total) by mouth at bedtime., Disp: 90 tablet, Rfl: 3   famotidine (PEPCID) 20 MG tablet, Take 1 tablet (20 mg total) by mouth daily., Disp: 90 tablet, Rfl: 3   FLUoxetine (PROZAC) 10 MG capsule, TAKE 3 CAPSULES (30 MG TOTAL) BY MOUTH DAILY., Disp: 84 capsule, Rfl: 6   fluticasone (FLONASE) 50 MCG/ACT nasal spray, Place 2 sprays into both nostrils daily. Prn after nasal saline, Disp: 16 g, Rfl: 11  furosemide (LASIX) 20 MG tablet, Take by mouth., Disp: , Rfl:    memantine (NAMENDA) 10 MG tablet, Take 1 tablet (10 mg total) by mouth 2 (two) times daily., Disp: 180 tablet, Rfl: 3   metoprolol succinate (TOPROL-XL) 50 MG 24 hr tablet, TAKE 1 TABLET BY MOUTH DAILY, Disp: 90 tablet, Rfl: 3   mirabegron ER (MYRBETRIQ) 50 MG TB24 tablet, Take 1 tablet (50 mg total) by mouth daily., Disp: 90 tablet, Rfl: 3   Multiple Vitamins-Minerals (MULTIVITAMIN WITH MINERALS) tablet, Take 1 tablet by mouth daily., Disp: , Rfl:    ondansetron (ZOFRAN-ODT) 4 MG disintegrating tablet, , Disp: , Rfl:    pantoprazole (PROTONIX) 40 MG tablet, TAKE 1 TABLET (40 MG TOTAL) BY MOUTH DAILY. 30  MINUTE(S) BEFORE BREAKFAST, Disp: 90 tablet, Rfl: 3   rosuvastatin (CRESTOR) 10 MG tablet, Take 1 tablet (10 mg total) by mouth at bedtime., Disp: 90 tablet, Rfl: 3   sodium chloride (OCEAN) 0.65 % SOLN nasal spray, Place 2 sprays into both nostrils as needed for congestion., Disp: 30 mL, Rfl: 11   sucralfate (CARAFATE) 1 g tablet, Take 1 tablet (1 g total) by mouth 4 (four) times daily., Disp: 120 tablet, Rfl: 11   tadalafil (CIALIS) 5 MG tablet, Take 1 tablet (5 mg total) by mouth daily as needed for erectile dysfunction., Disp: 15 tablet, Rfl: 0   tamsulosin (FLOMAX) 0.4 MG CAPS capsule, TAKE 1 CAPSULE (0.4 MG TOTAL) BY MOUTH DAILY., Disp: 28 capsule, Rfl: 6   traZODone (DESYREL) 100 MG tablet, Take 1 tablet (100 mg total) by mouth at bedtime as needed for sleep., Disp: 30 tablet, Rfl: 0   levofloxacin (LEVAQUIN) 750 MG tablet, TAKE 1 TABLET (750 MG TOTAL) BY MOUTH DAILY. WITH FOOD (Patient not taking: Reported on 10/02/2022), Disp: , Rfl:   Physical exam:  Vitals:   01/06/23 1313  BP: 114/73  Pulse: 87  Temp: 98.3 F (36.8 C)  TempSrc: Tympanic  SpO2: 93%  Weight: 244 lb (110.7 kg)   Physical Exam Cardiovascular:     Rate and Rhythm: Normal rate and regular rhythm.     Heart sounds: Normal heart sounds.  Pulmonary:     Effort: Pulmonary effort is normal.     Breath sounds: Normal breath sounds.  Abdominal:     General: Bowel sounds are normal.     Palpations: Abdomen is soft.  Skin:    General: Skin is warm and dry.  Neurological:     Mental Status: He is alert and oriented to person, place, and time.         Latest Ref Rng & Units 01/06/2023    1:55 PM  CMP  Glucose 70 - 99 mg/dL 782   BUN 8 - 23 mg/dL 15   Creatinine 9.56 - 1.24 mg/dL 2.13   Sodium 086 - 578 mmol/L 137   Potassium 3.5 - 5.1 mmol/L 4.2   Chloride 98 - 111 mmol/L 102   CO2 22 - 32 mmol/L 27   Calcium 8.9 - 10.3 mg/dL 9.0   Total Protein 6.5 - 8.1 g/dL 7.3   Total Bilirubin 0.3 - 1.2 mg/dL 0.2    Alkaline Phos 38 - 126 U/L 75   AST 15 - 41 U/L 15   ALT 0 - 44 U/L 12       Latest Ref Rng & Units 01/06/2023    1:55 PM  CBC  WBC 4.0 - 10.5 K/uL 9.2   Hemoglobin 13.0 - 17.0 g/dL 13.1  Hematocrit 39.0 - 52.0 % 39.5   Platelets 150 - 400 K/uL 313      Assessment and plan- Patient is a 77 y.o. male with history of stage I lung cancer s/p wedge resection in 2021.  He is here for a routine follow-up visit  Patient was supposed to see me with CT scans to be done prior but he missed his appointment for CT.  Given his ongoing symptoms of fatigue and unintentional weight loss I will plan to get a CT chest abdomen and pelvis with contrast in the next 1 to 2 weeks.  Will call him with results of the scans.  I will also have him see nutritional services today given his ongoing weight loss.   Visit Diagnosis 1. Weight loss, abnormal   2. Encounter for follow-up surveillance of lung cancer      Dr. Owens Shark, MD, MPH [facility] [phone] 01/06/2023 3:20 PM

## 2023-01-08 ENCOUNTER — Other Ambulatory Visit: Payer: Self-pay | Admitting: Family Medicine

## 2023-01-08 DIAGNOSIS — M159 Polyosteoarthritis, unspecified: Secondary | ICD-10-CM

## 2023-01-09 IMAGING — DX DG FOOT COMPLETE 3+V*R*
3 series · 3 of 3 positions shown · non-contrast
Comparison: None.

CLINICAL DATA: Right mid foot and ankle pain.  Rule out arthritis.

EXAM:
RIGHT FOOT COMPLETE - 3+ VIEW

[foot ap]
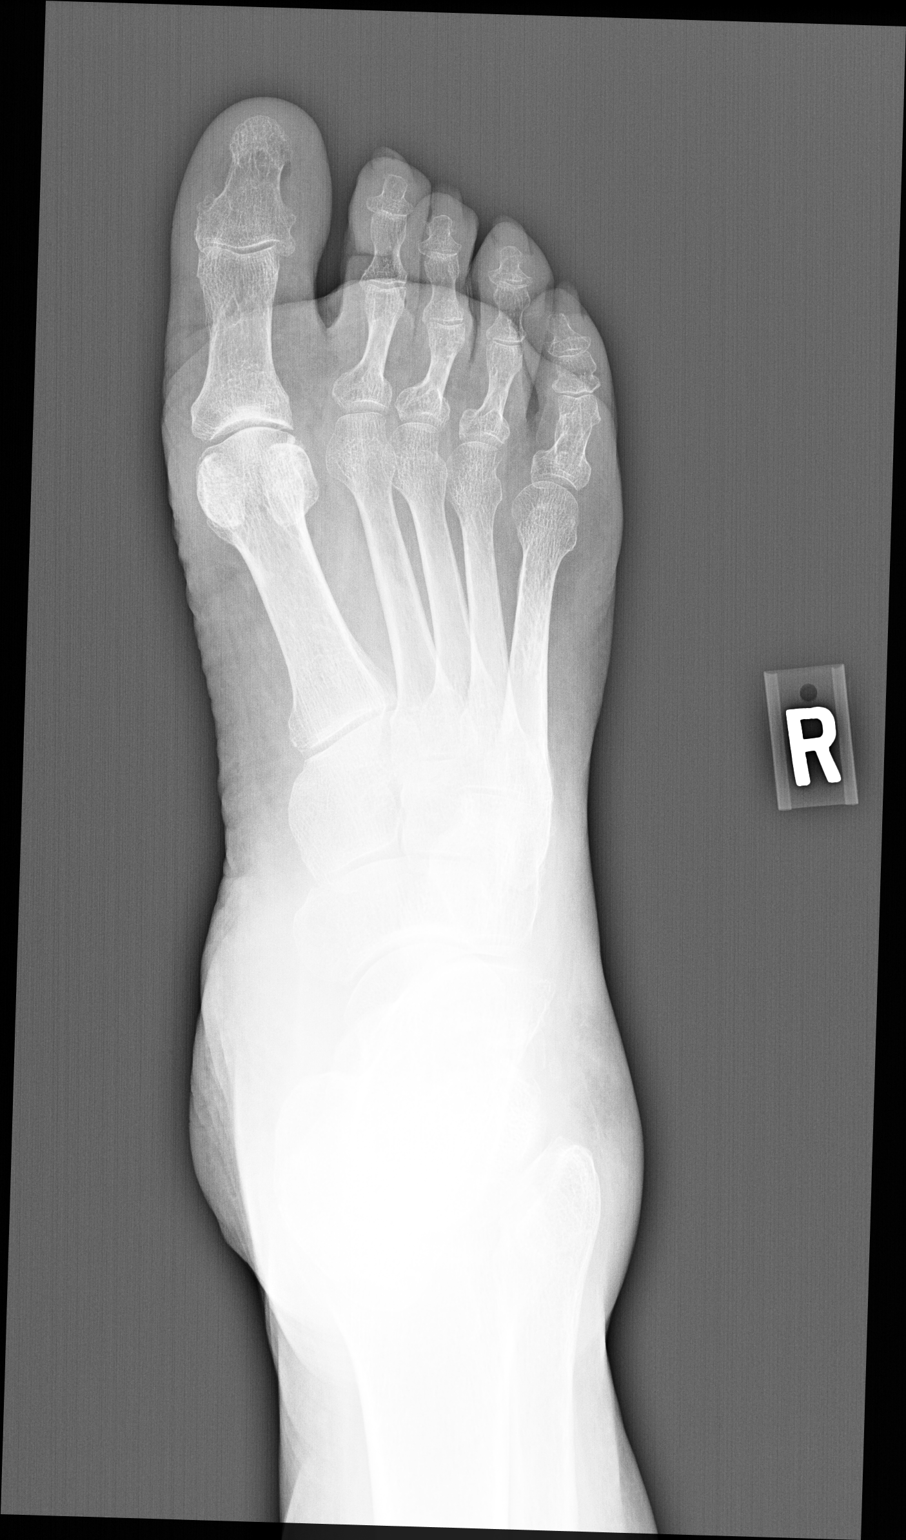

[foot obl (oblique)]
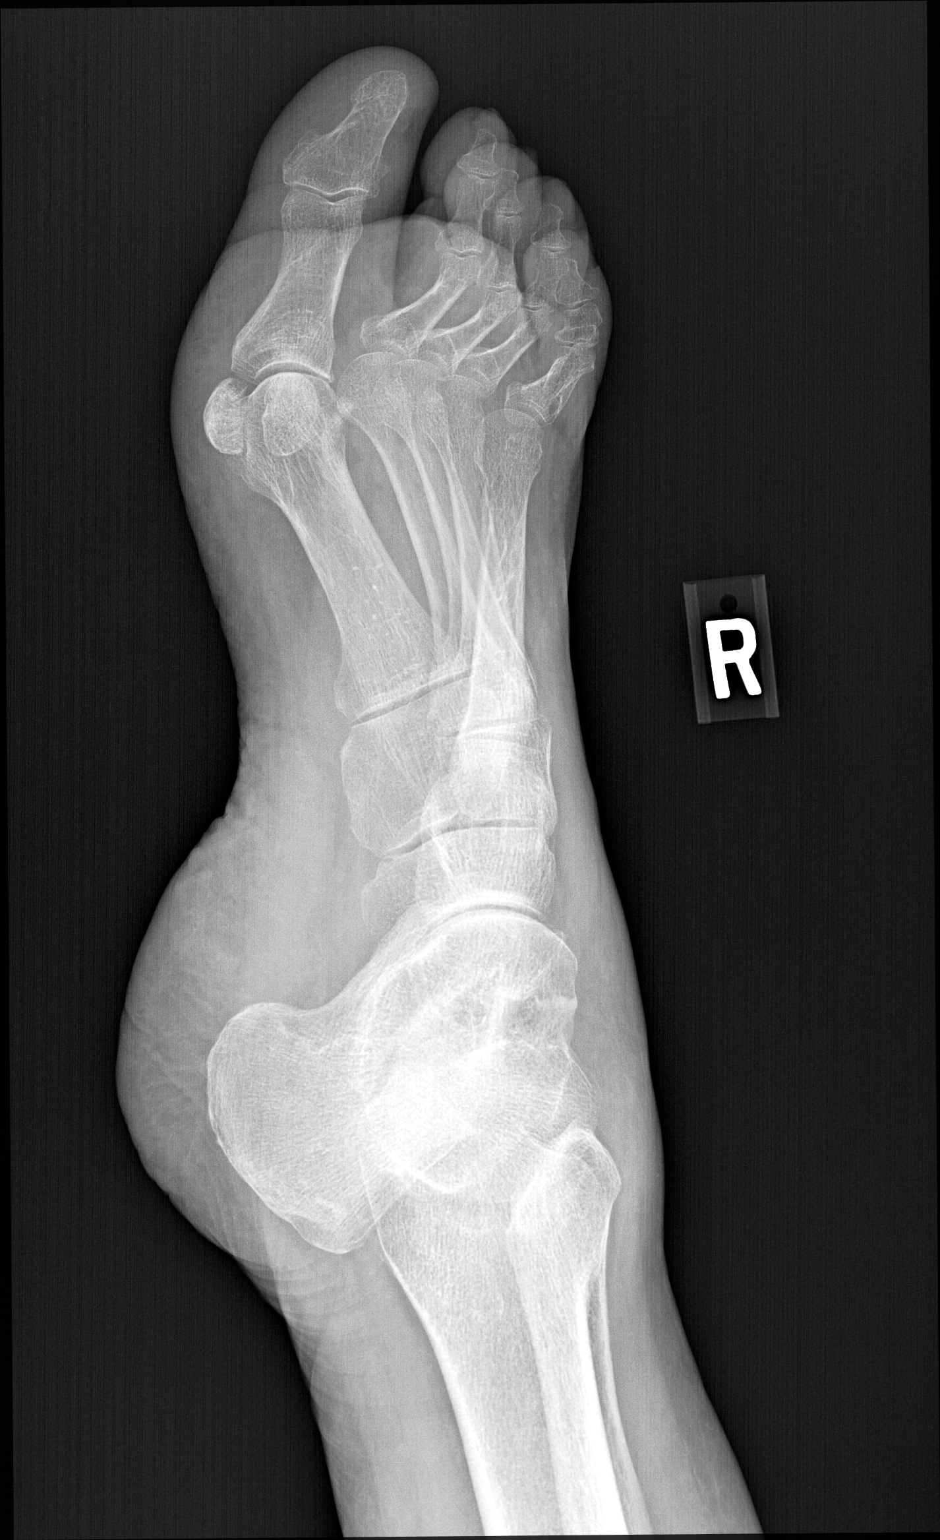

[foot lat]
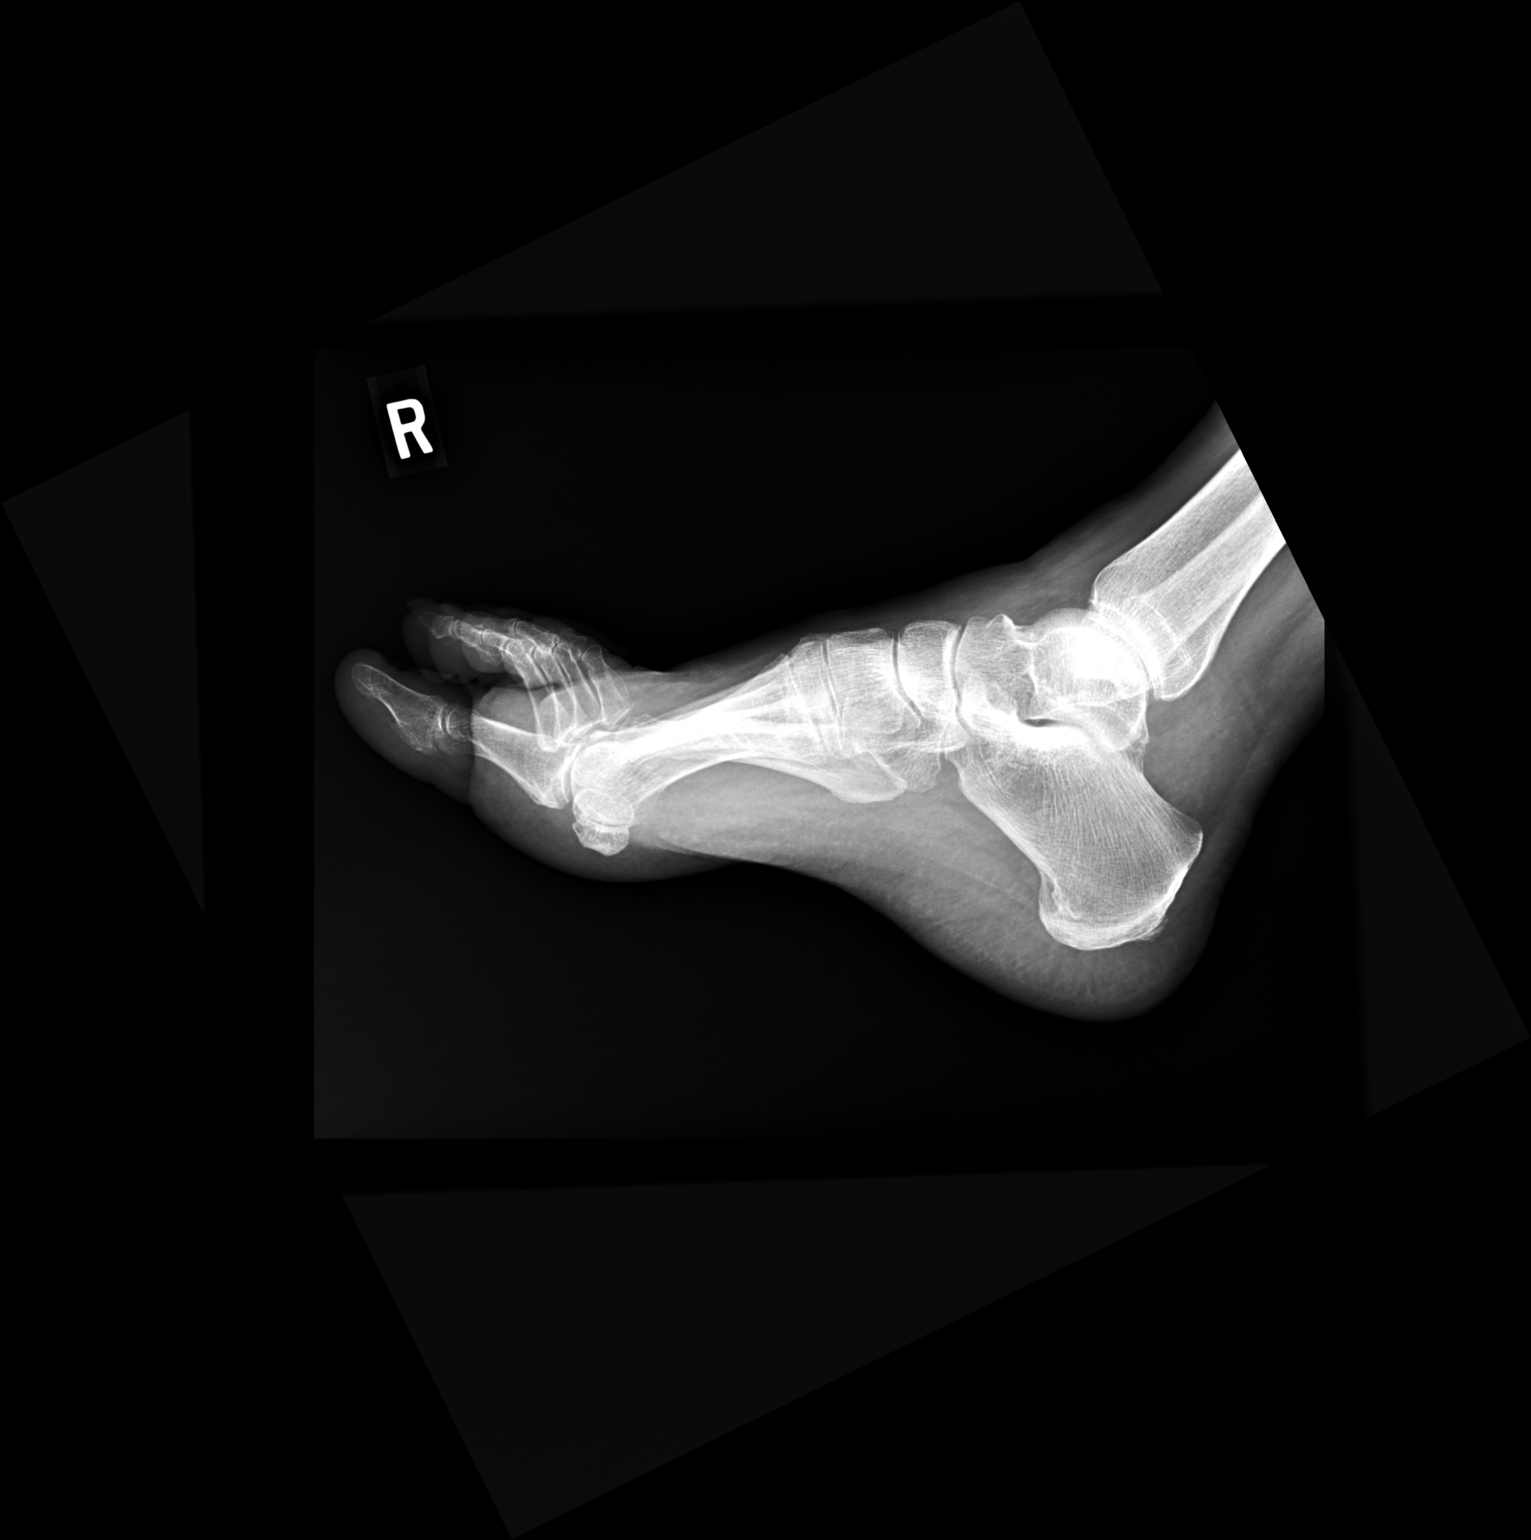

[3 of 3 positions shown; findings below may reference images not displayed]

FINDINGS: There is no evidence of fracture or dislocation. Mild
interphalangeal joint space narrowing and spurring prominent in the
first digit. Other focal bone abnormality. Soft tissues are
unremarkable.
IMPRESSION: 1. No evidence of fracture or dislocation.
2. Mild osteoarthritis of the interphalangeal joints.

## 2023-01-16 ENCOUNTER — Ambulatory Visit
Admission: RE | Admit: 2023-01-16 | Discharge: 2023-01-16 | Disposition: A | Discharge status: Home / Self Care | Payer: 59 | Source: Ambulatory Visit | Attending: Oncology | Admitting: Oncology

## 2023-01-16 DIAGNOSIS — K5730 Diverticulosis of large intestine without perforation or abscess without bleeding: Secondary | ICD-10-CM | POA: Diagnosis not present

## 2023-01-16 DIAGNOSIS — Z08 Encounter for follow-up examination after completed treatment for malignant neoplasm: Secondary | ICD-10-CM | POA: Diagnosis not present

## 2023-01-16 DIAGNOSIS — Z85118 Personal history of other malignant neoplasm of bronchus and lung: Secondary | ICD-10-CM | POA: Insufficient documentation

## 2023-01-16 DIAGNOSIS — J9809 Other diseases of bronchus, not elsewhere classified: Secondary | ICD-10-CM | POA: Diagnosis not present

## 2023-01-16 DIAGNOSIS — R634 Abnormal weight loss: Secondary | ICD-10-CM | POA: Diagnosis not present

## 2023-01-16 DIAGNOSIS — J439 Emphysema, unspecified: Secondary | ICD-10-CM | POA: Diagnosis not present

## 2023-01-16 DIAGNOSIS — C3490 Malignant neoplasm of unspecified part of unspecified bronchus or lung: Secondary | ICD-10-CM | POA: Diagnosis not present

## 2023-01-16 DIAGNOSIS — N281 Cyst of kidney, acquired: Secondary | ICD-10-CM | POA: Diagnosis not present

## 2023-01-16 MED ORDER — IOHEXOL 300 MG/ML  SOLN
100.0000 mL | Freq: Once | INTRAMUSCULAR | Status: AC | PRN
  Administered 2023-01-16: 100 mL via INTRAVENOUS

## 2023-01-20 ENCOUNTER — Telehealth: Payer: Self-pay | Admitting: Internal Medicine

## 2023-01-20 ENCOUNTER — Telehealth: Payer: Self-pay

## 2023-01-20 NOTE — Telephone Encounter (Signed)
Christopher Orr- please inform pt that I would recommend MRI abdomen for further evaluation of his kidney right- 1.2 cm. or else he can wait to speak to Urology/ or Dr.Rao is back.

## 2023-02-16 ENCOUNTER — Inpatient Hospital Stay: Payer: 59 | Attending: Oncology | Admitting: Oncology

## 2023-02-17 ENCOUNTER — Encounter: Payer: Self-pay | Admitting: Oncology

## 2023-02-19 ENCOUNTER — Emergency Department
Admission: EM | Admit: 2023-02-19 | Discharge: 2023-02-19 | Disposition: A | Discharge status: Home / Self Care | Payer: 59 | Attending: Emergency Medicine | Admitting: Emergency Medicine

## 2023-02-19 ENCOUNTER — Emergency Department: Payer: 59

## 2023-02-19 ENCOUNTER — Other Ambulatory Visit: Payer: Self-pay

## 2023-02-19 DIAGNOSIS — R42 Dizziness and giddiness: Secondary | ICD-10-CM | POA: Diagnosis not present

## 2023-02-19 DIAGNOSIS — I509 Heart failure, unspecified: Secondary | ICD-10-CM | POA: Insufficient documentation

## 2023-02-19 DIAGNOSIS — J449 Chronic obstructive pulmonary disease, unspecified: Secondary | ICD-10-CM | POA: Insufficient documentation

## 2023-02-19 DIAGNOSIS — R55 Syncope and collapse: Secondary | ICD-10-CM | POA: Diagnosis not present

## 2023-02-19 DIAGNOSIS — I110 Hypertensive heart disease with heart failure: Secondary | ICD-10-CM | POA: Insufficient documentation

## 2023-02-19 DIAGNOSIS — R9082 White matter disease, unspecified: Secondary | ICD-10-CM | POA: Diagnosis not present

## 2023-02-19 DIAGNOSIS — Z1152 Encounter for screening for COVID-19: Secondary | ICD-10-CM | POA: Diagnosis not present

## 2023-02-19 LAB — URINALYSIS, COMPLETE (UACMP) WITH MICROSCOPIC
Bacteria, UA: NONE SEEN
Bilirubin Urine: NEGATIVE
Glucose, UA: NEGATIVE mg/dL
Hgb urine dipstick: NEGATIVE
Ketones, ur: NEGATIVE mg/dL
Leukocytes,Ua: NEGATIVE
Nitrite: NEGATIVE
Protein, ur: NEGATIVE mg/dL
Specific Gravity, Urine: 1.011 (ref 1.005–1.030)
pH: 5 (ref 5.0–8.0)

## 2023-02-19 LAB — COMPREHENSIVE METABOLIC PANEL
ALT: 16 U/L (ref 0–44)
AST: 15 U/L (ref 15–41)
Albumin: 3.6 g/dL (ref 3.5–5.0)
Alkaline Phosphatase: 77 U/L (ref 38–126)
Anion gap: 9 (ref 5–15)
BUN: 14 mg/dL (ref 8–23)
CO2: 20 mmol/L — ABNORMAL LOW (ref 22–32)
Calcium: 8.5 mg/dL — ABNORMAL LOW (ref 8.9–10.3)
Chloride: 107 mmol/L (ref 98–111)
Creatinine, Ser: 1.1 mg/dL (ref 0.61–1.24)
GFR, Estimated: >60 mL/min (ref >60)
Glucose, Bld: 91 mg/dL (ref 70–99)
Potassium: 3.7 mmol/L (ref 3.5–5.1)
Sodium: 136 mmol/L (ref 135–145)
Total Bilirubin: 0.4 mg/dL (ref 0.3–1.2)
Total Protein: 7.1 g/dL (ref 6.5–8.1)

## 2023-02-19 LAB — TROPONIN I (HIGH SENSITIVITY)
Troponin I (High Sensitivity): 5 ng/L (ref <18)
Troponin I (High Sensitivity): 6 ng/L (ref <18)

## 2023-02-19 LAB — SARS CORONAVIRUS 2 BY RT PCR: SARS Coronavirus 2 by RT PCR: NEGATIVE

## 2023-02-19 LAB — ETHANOL: Alcohol, Ethyl (B): 26 mg/dL — ABNORMAL HIGH (ref <10)

## 2023-02-19 LAB — CBC
HCT: 38.3 % — ABNORMAL LOW (ref 39.0–52.0)
Hemoglobin: 12.7 g/dL — ABNORMAL LOW (ref 13.0–17.0)
MCH: 31 pg (ref 26.0–34.0)
MCHC: 33.2 g/dL (ref 30.0–36.0)
MCV: 93.4 fL (ref 80.0–100.0)
Platelets: 263 10*3/uL (ref 150–400)
RBC: 4.1 MIL/uL — ABNORMAL LOW (ref 4.22–5.81)
RDW: 14.1 % (ref 11.5–15.5)
WBC: 7.5 10*3/uL (ref 4.0–10.5)
nRBC: 0 % (ref 0.0–0.2)

## 2023-02-19 MED ORDER — SODIUM CHLORIDE 0.9 % IV BOLUS
1000.0000 mL | Freq: Once | INTRAVENOUS | Status: AC
  Administered 2023-02-19: 1000 mL via INTRAVENOUS

## 2023-02-19 NOTE — Discharge Instructions (Signed)
As we discussed please drink plenty of fluids over the next several days.  Please call the number provided for cardiology to arrange a follow-up appointment for further evaluation and consideration of a Holter monitor (a cardiac monitor).  As we discussed return immediately to the emergency department for any further episodes of dizziness lightheadedness or if you feel like you are going to pass out.

## 2023-02-19 NOTE — ED Notes (Signed)
Pt going to CT at this time.

## 2023-02-19 NOTE — ED Provider Notes (Signed)
[facility] Provider Note    Event Date/Time   First MD Initiated Contact with Patient 02/19/23 1803     (approximate)  History   Chief Complaint: Dizziness and Near Syncope  HPI  Christopher Orr is a 77 y.o. male with a past medical history of hypertension, CAD status post stents, CHF, COPD on chronic oxygen although the patient does not wear, presents to the emergency department after 2 syncopal versus near syncopal episodes.  According to the patient earlier today he was getting up to use the bathroom when he got lightheaded and had a brief syncopal event.  Patient states he was able to get back to his chair however approximately an hour later he had another syncopal event.  Patient is not sure if he was standing this time or if he was sitting.  Patient denies any chest pain today or shortness of breath more than normal at any point.  Patient does drink alcohol on a daily basis, states he has had 1 drink today which is pretty typical for this time of the day.  Denies ever having withdrawal symptoms such as shaking or seizures in the past.  Patient denies any recent illnesses such as fever cough congestion vomiting or diarrhea.  No urinary symptoms.  Physical Exam   Triage Vital Signs: ED Triage Vitals  Encounter Vitals Group     BP 02/19/23 1806 99/69     Systolic BP Percentile --      Diastolic BP Percentile --      Pulse Rate 02/19/23 1806 77     Resp 02/19/23 1806 13     Temp 02/19/23 1806 98 F (36.7 C)     Temp Source 02/19/23 1806 Oral     SpO2 02/19/23 1806 95 %     Weight 02/19/23 1802 260 lb (117.9 kg)     Height 02/19/23 1802 6' (1.829 m)     Head Circumference --      Peak Flow --      Pain Score 02/19/23 1801 0     Pain Loc --      Pain Education --      Exclude from Growth Chart --     Most recent vital signs: Vitals:   02/19/23 1806  BP: 99/69  Pulse: 77  Resp: 13  Temp: 98 F (36.7 C)  SpO2: 95%    General: Awake, no  distress.  CV:  Good peripheral perfusion.  Regular rate and rhythm  Resp:  Normal effort.  Equal breath sounds bilaterally.  Abd:  No distention.  Soft, nontender.  No rebound or guarding.  ED Results / Procedures / Treatments   EKG  EKG viewed and interpreted by myself shows a normal sinus rhythm at 77 bpm with a slightly widened QRS, left axis deviation, largely normal intervals with nonspecific but no concerning ST changes.  RADIOLOGY  I have reviewed and interpreted CT head images.  No bleed seen on my evaluation. Radiology is read the CT scan is negative for acute abnormality.   MEDICATIONS ORDERED IN ED: Medications - No data to display   IMPRESSION / MDM / ASSESSMENT AND PLAN / ED COURSE  I reviewed the triage vital signs and the nursing notes.  Patient's presentation is most consistent with acute presentation with potential threat to life or bodily function.  Patient presents to the emergency department after 2 syncopal events earlier today separated by approximately 1 hour.  Overall patient appears well, no distress.  Patient states he is supposed to wear oxygen but admits that he does not do so.  Currently satting 95% on room air.  Patient is a daily smoker.  Patient is a daily drinker usually drinks 2 or 3 drinks per day mostly in the evening.  Patient states he has had 1 drink so far today.  Differentials quite broad but would include ACS, arrhythmia, dehydration, alcohol intoxication or withdrawal, less likely CVA or ICH.  We will check labs, IV hydrate, obtain CT imaging of the head obtain a COVID swab and continue to closely monitor.  Patient CT scan is negative.  Patient's alcohol level is 26, troponin negative x 2.  CBC shows no significant findings, chemistry is reassuring with anion gap of 9.  COVID test is negative.  However given the patient's 2 syncopal episodes I did discuss admission to the hospital for ongoing cardiac telemetry.  Patient strongly wishes to go  home.  I discussed with the patient if symptoms recur he needs to return immediately to the emergency department for admission for ongoing monitoring.  Otherwise I will refer to cardiology for Holter monitor.  Discussed with the patient to increase oral fluids.  Patient agreeable to plan of care.  FINAL CLINICAL IMPRESSION(S) / ED DIAGNOSES   near syncope Orthostatic  Note:  This document was prepared using Dragon voice recognition software and may include unintentional dictation errors.   Minna Antis, MD 02/19/23 2049

## 2023-02-19 NOTE — ED Triage Notes (Addendum)
Pt came in via [facility] from home for lightheadedness/syncope (fainting - 2 episodes). Patient is suppose to wear oxygen at home but does not and stays at 94-95%. Pt also smokes. Family stated to EMS he has also been drinking alcohol today and pt urine has been cloudy recently. EMS also stated possible hx of dementia per the family. Patient states he drinks alcohol on a normal bases but denies ever having any seizures or shakes or episodes of withdrawal from not drinking. Pt on metoprolol and plavix and states he is compliant with his meds. Denies CP, SOB.  EMS vitals:  BP 100/68 HR 80 BS 91 Temp 97.5 SPO2 94% RA

## 2023-03-31 ENCOUNTER — Other Ambulatory Visit: Payer: Medicare HMO

## 2023-03-31 ENCOUNTER — Encounter: Payer: Self-pay | Admitting: Urology

## 2023-04-03 ENCOUNTER — Telehealth: Payer: Self-pay | Admitting: Pharmacy Technician

## 2023-04-03 ENCOUNTER — Other Ambulatory Visit (HOSPITAL_COMMUNITY): Payer: Self-pay

## 2023-04-03 NOTE — Telephone Encounter (Signed)
Pharmacy Patient Advocate Encounter  Insurance verification completed.    The patient is insured through [facility]   Ran test claim for fluoxetine caps. Currently a quantity of 84 is a 28 day supply and the co-pay is $0.00 .   This test claim was processed through [facility]- copay amounts may vary at other pharmacies due to pharmacy/plan contracts, or as the patient moves through the different stages of their insurance plan.

## 2023-04-03 NOTE — Telephone Encounter (Signed)
Called pt but vm is full unable to leave a message to call the office back. 

## 2023-04-07 ENCOUNTER — Other Ambulatory Visit (HOSPITAL_COMMUNITY): Payer: Self-pay

## 2023-04-07 ENCOUNTER — Ambulatory Visit: Payer: Medicare HMO | Admitting: Urology

## 2023-04-07 ENCOUNTER — Encounter: Payer: Self-pay | Admitting: Urology

## 2023-04-10 NOTE — Telephone Encounter (Signed)
Attempted to call pt.  Call went straight to voicemail and the mail box is full.  Was unable to leave message.

## 2023-04-10 NOTE — Telephone Encounter (Signed)
Patient returned our call.  I read message to patient from Pharmacy Patient Advocate.  Patient states he would like to have this medication sent to [facility] on [address] (not in [employer]).  I was a little unsure as to what we needed from patient.  I transferred call to [facility], CMA.

## 2023-04-14 MED ORDER — FLUOXETINE HCL 10 MG PO CAPS: 30.0000 mg | ORAL_CAPSULE | Freq: Every day | ORAL | 6 refills | Status: DC

## 2023-04-14 NOTE — Telephone Encounter (Signed)
Medication sent to pharmacy

## 2023-04-14 NOTE — Addendum Note (Signed)
Addended by: Kristie Cowman on: 04/14/2023 10:45 AM   Modules accepted: Orders

## 2023-04-30 ENCOUNTER — Other Ambulatory Visit: Payer: Self-pay | Admitting: Family Medicine

## 2023-04-30 DIAGNOSIS — M159 Polyosteoarthritis, unspecified: Secondary | ICD-10-CM

## 2023-04-30 NOTE — Telephone Encounter (Signed)
Tired to call pt to get him scheduled as LOV was in February VM box was full unable to leave VM

## 2023-05-04 ENCOUNTER — Telehealth: Payer: Self-pay | Admitting: Family Medicine

## 2023-05-04 ENCOUNTER — Other Ambulatory Visit: Payer: Self-pay | Admitting: Family Medicine

## 2023-05-04 NOTE — Telephone Encounter (Signed)
Copied from CRM [id number]. Topic: Medicare AWV >> May 04, 2023 10:02 AM Payton Doughty wrote: Reason for CRM: Called 05/04/2023 to sched AWV - NO VOICEMAIL  Verlee Rossetti; Care Guide Ambulatory Clinical Support [place] [facility] Direct Dial: [phone]

## 2023-05-05 ENCOUNTER — Telehealth: Payer: Self-pay | Admitting: Family Medicine

## 2023-05-05 NOTE — Telephone Encounter (Signed)
Patient just called and needs 3 refills they are. mirabegron ER (MYRBETRIQ) 50 MG TB24 tablet, furosemide (LASIX) 20 MG tablet, and ARIPiprazole (ABILIFY) 10 MG tablet. The pharmacy he uses is [id number] - [place], [place] - [address], [place] [address] Phone: [phone]  Fax: [phone]  His number is [phone]

## 2023-05-07 ENCOUNTER — Encounter: Payer: Self-pay | Admitting: Pharmacist

## 2023-05-07 NOTE — Telephone Encounter (Signed)
Please add to the previous prescription to the other medications that were requested. FLUoxetine FLUoxetine (PROZAC) 10 MG capsule

## 2023-05-07 NOTE — Progress Notes (Signed)
Medical History Clarification/Chart Review:  Pharmacy Fill History: Myrbetriq (mirabegron):50 mg daily (no hx dose change) - Fill Dates 06/23/22, 12/16/22, 03/10/23, 04/03/23 (28 day supply)  NAMENDA (memantine), Aricept (donepezil) 07/08/2019: [facility] - Start Namenda 5 mg BID - Continue Aricept 5 mg daily Anxiety, depression and PTSD - following psychiatry  - Continue taking Abilify as prescribed  - Continue taking Prozac 60 mg daily - Continue taking Trazodone 150 mg nightly  01/16/2020: [facility] - Increase Namenda 10 mg twice daily (per note, though [facility] med list did not get updated to reflect new dose)  05/11/20: [facility] visit, someone documented in med list patient is taking Namenda qd  08/23/20: Namenda reordered by [facility] per old instructions - 5 mg twice daily  02/01/21: [facility] - Continue Namena 10 mg BID  Lack of communication/dose updates seems to be the issue. However, per the prescription fill history, he has been getting 10 mg tablets, fill hx is sporadic/inconsistent.   Pharmacy Fill History: Namenda (memantine): 10 mg twice daily - Fill Dates 07/03/22 [15 day supply], 12/16/22, 03/10/23, 04/03/23 (28 day supply)  Pharmacy Fill History:  Aricept (donepezil): 5 mg daily (no hx dose change) - Fill Dates 06/23/22, 12/16/22, 03/10/23, 04/03/23 (each a 28 day supply)  There has been recommendations to consider stopping Aricept with ongoing alcohol abuse/hospitalizations though no actual documentation of plan to stop/rationale. Unclear why reordered, though possibly remained on [facility] med list and was reordered with other medications.   PROZAC (fluoxetine) Has been on since before 2016 12/22/14: fluoxetine 60 mg daily (fill history showing this dose through 02/19/21) 03/22/21: fluoxetine 40 mg daily  04/29/22: fluoxetine 20 mg daily (Prescription Note: D/c 40 mg due to ddi with metoprolol) 07/03/22: fluoxetine increased from 20 mg to 30 mg daily.   Pharmacy Fill History shows filling  both 20 mg and 10 mg capsule strengths for total daily dose 50 mg daily   - fluoxetine 10 mg capsule (3 per day = 30 mg daily);  Filled 12/16/22, 03/10/23, 05/11/23  - fluoxetine 20 mg capsule (1 per day = 20 mg daily)   Filled 03/10/23, 04/03/23, 05/13/23  Abilify (aripiprazole) 02/01/21 Neuro visit: Abilify noted as "managed by psych" but removed from medication list without explanation.   Pharmacy Fill History: Abilify (aripiprazole): 10 mg daily (no hx dose change) - Fill Dates 06/23/22, 12/16/22, 03/10/23, 04/03/23 (each a 28 day supply)

## 2023-05-08 NOTE — Telephone Encounter (Signed)
Thayer Ohm from [facility], [phone]. Pharmacy has been trying for 5 days to refill patient's prescriptions. Thayer Ohm needs to know if Dr Clent Ridges is going to refill or not.

## 2023-05-11 ENCOUNTER — Other Ambulatory Visit: Payer: Self-pay

## 2023-05-11 ENCOUNTER — Telehealth: Payer: Self-pay | Admitting: Family Medicine

## 2023-05-11 DIAGNOSIS — R4189 Other symptoms and signs involving cognitive functions and awareness: Secondary | ICD-10-CM

## 2023-05-11 NOTE — Telephone Encounter (Signed)
Patient just called and said he needs a refill on donepezil (ARICEPT) 5 MG tablet. The pharmacy he uses [id number] - [place], [place] - [address], [place] [address] Phone: [phone]  Fax: [phone]  His number is [phone]. He is out of his medication.

## 2023-05-11 NOTE — Telephone Encounter (Signed)
Rx request sent to PCP.

## 2023-05-12 ENCOUNTER — Telehealth: Payer: Self-pay | Admitting: Family Medicine

## 2023-05-12 ENCOUNTER — Other Ambulatory Visit: Payer: Self-pay

## 2023-05-12 DIAGNOSIS — R4189 Other symptoms and signs involving cognitive functions and awareness: Secondary | ICD-10-CM

## 2023-05-12 MED ORDER — DONEPEZIL HCL 5 MG PO TABS
5.0000 mg | ORAL_TABLET | Freq: Every day | ORAL | 3 refills | Status: DC
Start: 2023-05-12 — End: 2024-03-14

## 2023-05-12 NOTE — Telephone Encounter (Signed)
[facility] called regarding a med refill on  donepezil (ARICEPT) 5 MG tablet  Please call [phone].

## 2023-05-12 NOTE — Telephone Encounter (Signed)
Rx sent in to pharmacy.

## 2023-05-27 ENCOUNTER — Other Ambulatory Visit: Payer: Self-pay | Admitting: Family Medicine

## 2023-06-02 ENCOUNTER — Ambulatory Visit: Payer: Medicare HMO | Admitting: Family Medicine

## 2023-06-02 ENCOUNTER — Encounter: Payer: Self-pay | Admitting: Family Medicine

## 2023-06-02 ENCOUNTER — Telehealth: Payer: Self-pay | Admitting: Family Medicine

## 2023-06-02 VITALS — BP 116/78 | HR 79 | Temp 98.0°F | Resp 16 | Ht 72.0 in | Wt 233.2 lb

## 2023-06-02 DIAGNOSIS — F1210 Cannabis abuse, uncomplicated: Secondary | ICD-10-CM | POA: Diagnosis not present

## 2023-06-02 DIAGNOSIS — C3490 Malignant neoplasm of unspecified part of unspecified bronchus or lung: Secondary | ICD-10-CM

## 2023-06-02 DIAGNOSIS — R634 Abnormal weight loss: Secondary | ICD-10-CM

## 2023-06-02 DIAGNOSIS — Z9189 Other specified personal risk factors, not elsewhere classified: Secondary | ICD-10-CM

## 2023-06-02 DIAGNOSIS — G301 Alzheimer's disease with late onset: Secondary | ICD-10-CM

## 2023-06-02 DIAGNOSIS — F319 Bipolar disorder, unspecified: Secondary | ICD-10-CM

## 2023-06-02 DIAGNOSIS — F1010 Alcohol abuse, uncomplicated: Secondary | ICD-10-CM

## 2023-06-02 DIAGNOSIS — Z7189 Other specified counseling: Secondary | ICD-10-CM | POA: Diagnosis not present

## 2023-06-02 DIAGNOSIS — F4310 Post-traumatic stress disorder, unspecified: Secondary | ICD-10-CM | POA: Diagnosis not present

## 2023-06-02 DIAGNOSIS — J449 Chronic obstructive pulmonary disease, unspecified: Secondary | ICD-10-CM

## 2023-06-02 DIAGNOSIS — Z720 Tobacco use: Secondary | ICD-10-CM

## 2023-06-02 DIAGNOSIS — E639 Nutritional deficiency, unspecified: Secondary | ICD-10-CM

## 2023-06-02 DIAGNOSIS — Z23 Encounter for immunization: Secondary | ICD-10-CM

## 2023-06-02 DIAGNOSIS — I5032 Chronic diastolic (congestive) heart failure: Secondary | ICD-10-CM

## 2023-06-02 DIAGNOSIS — N529 Male erectile dysfunction, unspecified: Secondary | ICD-10-CM

## 2023-06-02 DIAGNOSIS — F0280 Dementia in other diseases classified elsewhere without behavioral disturbance: Secondary | ICD-10-CM

## 2023-06-02 NOTE — Patient Instructions (Addendum)
It was a pleasure meeting you today. Thank you for allowing me to take part in your health care.  Our goals for today as we discussed include:  Flu vaccine today  You have a follow up appointment with Dr Smith Robert on Dec 6.  This is the Oncology doctor.    Will send referral to Neurology, Pharmacy.  Will call you with follow up appointment for PCP  Please bring in all medications for your next visit  This is a list of the screening recommended for you and due dates:  Health Maintenance  Topic Date Due   DTaP/Tdap/Td vaccine (1 - Tdap) Never done   Zoster (Shingles) Vaccine (1 of 2) Never done   COVID-19 Vaccine (4 - 2023-24 season) 03/29/2023   Medicare Annual Wellness Visit  04/30/2023   Pneumonia Vaccine  Completed   Flu Shot  Completed   Hepatitis C Screening  Completed   HPV Vaccine  Aged Out   Colon Cancer Screening  Discontinued       If you have any questions or concerns, please do not hesitate to call the office at [phone].  I look forward to our next visit and until then take care and stay safe.  Regards,   Dana Allan, MD   [facility]

## 2023-06-02 NOTE — Progress Notes (Signed)
SUBJECTIVE:   Chief Complaint  Patient presents with   Medical Management of Chronic Issues   HPI Presents to clinic with daughter in law to discuss chronic disease management  Discussed the use of AI scribe software for clinical note transcription with the patient, who gave verbal consent to proceed.  History of Present Illness The patient, with a known history of dementia, presented for a follow-up visit. They expressed uncertainty about the purpose of the visit and requested medication for dementia. The patient has been previously referred to a neurologist, Dr. Sherryll Burger, for dementia management, but there have been missed appointments.  The patient lives alone and has been experiencing weight loss due to poor dietary habits, primarily consuming oatmeal, soup, and canned beans. They reported eating only one meal a day. There were concerns about the patient's ability to care for themselves, but they insisted on their independence.  The patient has a history of smoking and occasional alcohol and marijuana use. They reported shortness of breath but denied any pain. They had a recent hospital visit due to a fall at home and recent hospital evaluation was reassuring.  He is currently on Antiplatelet therapy.  The patient manages their own finances and pays their own bills. They have a daughter, Baird Lyons, who checks in on them and would likely make healthcare decisions for them if necessary.  The patient's medication regimen is extensive, including multiple medications for dementia, stomach issues, and cholesterol management. They also take a water pill and a medication for erectile dysfunction. The patient receives their medications in pre-packaged blister packs, but there is uncertainty about the necessity and effectiveness of some of these medications.  The patient has a history of lung surgery and was asked about follow-ups with an oncologist, but they could not recall any recent  visits.  Overall, the patient's dementia appears to be progressing, and there are concerns about their ability to manage their health independently. There is a need for further evaluation and potential intervention to ensure the patient's safety and well-being.    PERTINENT PMH / PSH: As above  OBJECTIVE:  BP 116/78   Pulse 79   Temp 98 F (36.7 C)   Resp 16   Ht 6' (1.829 m)   Wt 233 lb 4 oz (105.8 kg)   SpO2 97%   BMI 31.63 kg/m    Physical Exam Vitals reviewed.  Constitutional:      General: He is not in acute distress.    Appearance: Normal appearance. He is obese. He is not ill-appearing, toxic-appearing or diaphoretic.  Eyes:     General:        Right eye: No discharge.        Left eye: No discharge.  Cardiovascular:     Rate and Rhythm: Normal rate and regular rhythm.     Heart sounds: Normal heart sounds.  Pulmonary:     Effort: Pulmonary effort is normal.     Breath sounds: Normal breath sounds.  Abdominal:     General: Bowel sounds are normal.  Musculoskeletal:        General: Normal range of motion.     Cervical back: Normal range of motion.  Skin:    General: Skin is warm and dry.  Neurological:     Mental Status: He is alert and oriented to person, place, and time. Mental status is at baseline.  Psychiatric:        Mood and Affect: Mood normal.  Behavior: Behavior normal. Behavior is cooperative.        Thought Content: Thought content normal.        Cognition and Memory: Cognition is impaired. Memory is impaired.        06/02/2023    1:05 PM 08/21/2022    8:06 AM 06/25/2022   11:42 AM 05/29/2022   10:03 AM 05/27/2022   12:36 PM  Depression screen PHQ 2/9  Decreased Interest 2 2 1 1 2   Down, Depressed, Hopeless 2 1 0 0 0  PHQ - 2 Score 4 3 1 1 2   Altered sleeping 0 0   0  Tired, decreased energy 3 3   3   Change in appetite 3 2   2   Feeling bad or failure about yourself  0 1   0  Trouble concentrating 3 3   2   Moving slowly or  fidgety/restless 0 0   0  Suicidal thoughts 0 0   0  PHQ-9 Score 13 12   9   Difficult doing work/chores Not difficult at all Not difficult at all   Somewhat difficult      06/02/2023    1:05 PM 08/21/2022    8:06 AM 05/27/2022   12:36 PM 05/20/2022    1:33 PM  GAD 7 : Generalized Anxiety Score  Nervous, Anxious, on Edge 0 1 0 0  Control/stop worrying 0 0 0 0  Worry too much - different things 0 0 0 0  Trouble relaxing 1 0 2 1  Restless 0 0 0 0  Easily annoyed or irritable 1 1 1  0  Afraid - awful might happen 0 1 0 0  Total GAD 7 Score 2 3 3 1   Anxiety Difficulty Not difficult at all Not difficult at all Somewhat difficult     ASSESSMENT/PLAN:  Late onset Alzheimer's disease without behavioral disturbance (HCC) Assessment & Plan: On maximum dosing of current medications with no significant improvement. Discussed limitations of current treatment and potential benefits of neurology follow-up for additional resources and planning. -Refer back to neurology for further management and resources. -Consider social work involvement for future planning.  Orders: -     AMB Referral VBCI Care Management  Need for influenza vaccination -     Flu Vaccine Trivalent High Dose (Fluad)  Weight loss Assessment & Plan: Reports eating one meal a day and significant weight loss (from 257 to 233 lbs). -Involve social work to assess for home aid services to assist with meals and other needs.  Orders: -     AMB Referral VBCI Care Management  Malignant neoplasm of lung, unspecified laterality, unspecified part of lung (HCC) Assessment & Plan: Follows with Oncology Recently had CT abdomen showing new, or at least significantly enlarged small hyperdense lesion arising from the posterior midportion of the right kidney measuring 1.3 x 1.1 cm, highly suspicious for a small renal cell carcinoma.Recommendation to follow up with MRI.   Discussed with patient and was not aware of findings.  He did not  follow up with Oncologist for office visit to discuss. After reviewing note from 06/24 will have patient and daughter return to clinic to discuss proceeding with MRI abdomen.  Patient follows with Urology currently.     Orders: -     AMB Referral VBCI Care Management  Post traumatic stress disorder (PTSD) -     AMB Referral VBCI Care Management  Alcohol abuse -     AMB Referral VBCI Care Management  Tobacco abuse -  AMB Referral VBCI Care Management  Goals of care, counseling/discussion -     AMB Referral VBCI Care Management  Tetrahydrocannabinol (THC) use disorder, mild, abuse -     AMB Referral VBCI Care Management  Bipolar affective disorder, remission status unspecified (HCC) -     AMB Referral VBCI Care Management  Chronic obstructive pulmonary disease, unspecified COPD type (HCC) Assessment & Plan: Reports shortness of breath. Still smoking. Has albuterol inhaler for rescue therapy but does not use. -Encourage smoking cessation.  Orders: -     AMB Referral VBCI Care Management  Chronic diastolic heart failure (HCC) -     AMB Referral VBCI Care Management  Poor nutrition -     AMB Referral VBCI Care Management  Erectile dysfunction, unspecified erectile dysfunction type Assessment & Plan: Not using Cialis Discontinue medication   At risk for polypharmacy Assessment & Plan: On multiple medications with potential for adverse effects and interactions. Some medications may no longer be necessary. -Refer to pharmacy for comprehensive medication review and potential de-prescribing.  Orders: -     AMB Referral VBCI Care Management    PDMP reviewed  Return if symptoms worsen or fail to improve, for PCP.  Dana Allan, MD

## 2023-06-02 NOTE — Telephone Encounter (Signed)
Pt daughter would like to be called regarding the pt appointment on today

## 2023-06-06 ENCOUNTER — Encounter: Payer: Self-pay | Admitting: Family Medicine

## 2023-06-06 ENCOUNTER — Telehealth: Payer: Self-pay | Admitting: Family Medicine

## 2023-06-06 DIAGNOSIS — Z9189 Other specified personal risk factors, not elsewhere classified: Secondary | ICD-10-CM | POA: Insufficient documentation

## 2023-06-06 DIAGNOSIS — Z23 Encounter for immunization: Secondary | ICD-10-CM | POA: Insufficient documentation

## 2023-06-06 NOTE — Assessment & Plan Note (Signed)
Not using Cialis Discontinue medication

## 2023-06-06 NOTE — Assessment & Plan Note (Signed)
Reports shortness of breath. Still smoking. Has albuterol inhaler for rescue therapy but does not use. -Encourage smoking cessation.

## 2023-06-06 NOTE — Assessment & Plan Note (Signed)
On multiple medications with potential for adverse effects and interactions. Some medications may no longer be necessary. -Refer to pharmacy for comprehensive medication review and potential de-prescribing.

## 2023-06-06 NOTE — Assessment & Plan Note (Signed)
Follows with Oncology Recently had CT abdomen showing new, or at least significantly enlarged small hyperdense lesion arising from the posterior midportion of the right kidney measuring 1.3 x 1.1 cm, highly suspicious for a small renal cell carcinoma.Recommendation to follow up with MRI.   Discussed with patient and was not aware of findings.  He did not follow up with Oncologist for office visit to discuss. After reviewing note from 06/24 will have patient and daughter return to clinic to discuss proceeding with MRI abdomen.  Patient follows with Urology currently.

## 2023-06-06 NOTE — Assessment & Plan Note (Signed)
On maximum dosing of current medications with no significant improvement. Discussed limitations of current treatment and potential benefits of neurology follow-up for additional resources and planning. -Refer back to neurology for further management and resources. -Consider social work involvement for future planning.

## 2023-06-06 NOTE — Assessment & Plan Note (Addendum)
Reports eating one meal a day and significant weight loss (from 257 to 233 lbs). -Involve social work to assess for home aid services to assist with meals and other needs.

## 2023-06-07 NOTE — Telephone Encounter (Signed)
err

## 2023-06-08 ENCOUNTER — Telehealth: Payer: Self-pay | Admitting: *Deleted

## 2023-06-08 NOTE — Progress Notes (Unsigned)
  Care Coordination  Outreach Note  06/08/2023 Name: Christopher Orr MRN: [id number] DOB: May 22, 1946   Care Coordination Outreach Attempts: An unsuccessful telephone outreach was attempted today to offer the patient information about available care coordination services.  Follow Up Plan:  Additional outreach attempts will be made to offer the patient care coordination information and services.   Encounter Outcome:  No Answer  Burman Nieves, CCMA Care Coordination Care Guide Direct Dial: [phone]

## 2023-06-09 NOTE — Progress Notes (Signed)
  Care Coordination   Note   06/09/2023 Name: Christopher Orr MRN: [id number] DOB: 1946/05/26  Christopher Orr is a 77 y.o. year old male who sees Dana Allan, MD for primary care. I reached out to Michelle Nasuti by phone today to offer care coordination services.  [name] was given information about Care Coordination services today including:   The Care Coordination services include support from the care team which includes your Nurse Coordinator, Clinical Social Worker, or Pharmacist.  The Care Coordination team is here to help remove barriers to the health concerns and goals most important to you. Care Coordination services are voluntary, and the patient may decline or stop services at any time by request to their care team member.   Care Coordination Consent Status: Patient agreed to services and verbal consent obtained.   Follow up plan:  Telephone appointment with care coordination team member scheduled for:  06/16/2023 and 06/17/2023  Encounter Outcome:  Patient Scheduled from referral   Burman Nieves, [facility] Care Coordination Care Guide Direct Dial: [phone]

## 2023-06-09 NOTE — Telephone Encounter (Signed)
Called and spoke to the daughter and informed her of this information.

## 2023-06-16 ENCOUNTER — Other Ambulatory Visit: Payer: Medicare HMO | Admitting: Pharmacist

## 2023-06-16 NOTE — Progress Notes (Signed)
06/19/2023 Name: Christopher Orr MRN: [id number] DOB: 1945/10/10  Subjective  Chief Complaint  Patient presents with   Comprehensive Medication Review   Polypharmacy   Reason for visit: Christopher Orr is a 77 y.o. year old male who presented for a telephone visit.   They were referred to the pharmacist by their PCP for assistance in managing complex medication management and polypharmacy .   Care Team: Primary Care Provider: Dana Allan, MD Last visit with PCP: 06/02/23 On multiple medications with potential for adverse effects and interactions. Some medications may no longer be necessary. Refer to pharmacy for comprehensive medication review and potential de-prescribing.   Medication Access/Adherence: ?  Prescription drug coverage: Payor: [employer] MEDICARE / Plan: [employer] MEDICARE HMO / Product Type: *No Product type* / .   - Reports that all medications are affordable: Yes  - Medication packaging: Adherence packaging (pill packs). [facility] blister packs. Denies taking any medication outside of his pill pack. Denies any prescriptions not placed in pill packs.  - Patient manages their own medications/refills: Yes . - Missed medication doses:  Rarely  HPI: Patient has his medication list with him regarding contents of pill packaging. Denies any general concerns today. Discrepancies/comments noted below.     Medication List per Chart as of 06/16/23   Medication Indication Patient reports... Notes  Per phone call with [facility]  Cardiac Health      Metoprolol succinate HFpEF Taking Denies s/sx dizziness Is in pillpack  Rosuvastatin 10 mg daily CAD s/p stent 2006 Taking  Is in pillpack  Furosemide (Lasix) HFpEF Reports Taking  (false) Last filled in 2023.  Is NOT in pill pack.  Patient **denies taking any medications that are not in pillpack.   Aspirin 81 mg daily CAD s/p stent 2006 Not taking  N/A not taking   Clopidogrel 75 mg daily CAD s/p stent 2006 Taking s/p stent to LAD  in 2006. Denies s/sx bleeding or bruising. Patient denies any recent falls.   Is in pillpack  GU/GI     Myrbetriq  Taking  Reports good urinary control. Does not feel that he is having any sx LUTS.  Is in pillpack  Tamsulosin (Flomax)  Not taking (false)  Is in pillpack  Famotidine 20 mg  Pantoprazole  Reports Not taking (false)  Is in pillpack  Sucralfate 1g four times daily  Not taking (false)  Is in pillpack  Psych/Alheimer's     Fluoxetine (Prozac) BPD Taking Reports stable mood. Denies concerns of depression/mania Is in pillpack Pharmacy confirms 50 mg total daily dose  Aripiprazole (Abilify) BPD Taking Reports stable mood. Denies concerns of depression/mania Is in pillpack  Trazodone Sleep / Insomnia Taking Denies any issues with sleep at this time. He reports using daily, believes this is in his pill packs.   Is in pillpack   Memantine (Namenda) 10 mg twice daily Alzheimer's Taking  Is in pillpack  Donepezil (Aricept) Alzheimer's Taking Denies adverse effects Is in pillpack  Pulm      Albuterol COPD Has on hand, has not needed to use.  Reports breathing has been stable. Denies symptoms.   No fills in 2024.    Misc. Celebrex 200 mg daily OA Taking  Is in pill pack   Assessment and Plan:   Medication Adherence All medications were reviewed with patient today and medication list was updated as appropriate.  Barriers identified / Adherence concerns: No overtly clear adherence concerns (rarely misses doses per pt report) though patient report  of which medications he is and is not taking is not accurate. He seems to be referencing an old medication list.  Confirmed contents of pill packs with [facility] which had significant discrepancy to what patient reported he was/was not taking. Medication list updated as appropriate to reflect what he is getting from the pharmacy.   Medication Considerations/Polypharmacy HFpEF: Blood pressures have been very well controlled in clinic. Denies  dizziness at home though does have hx falls. Esp concerning in the setting of Plavix use and sedating medications.  Beta blockers not indicated as a part of GDMT in HFpEF. Not seeing other compelling indication such as mitral stenosis/AF etc. If no other indication, Given LVEF remains >40%, reasonable to consider discontinuation of metoprolol.   GU: Pt reports well-controlled LUTS.  Reasonable to consider trial off tamsulosin to see if Myrbetriq monotherapy is sufficient given adverse effect profile w alpha blockade.   GI: Unclear initial indication for PPI and H2RA though seems general broad GI symptoms. Ppi/H2RA documented for GERD , Carafate added for Nausea/vomiting/dizziness in 2022. Patient denies GI sx at this time. He cannot recall a hx of GI sx and did not believe he was on either PPI or HR2RA. Patient nor daughter have recollection. Not seeing hx of GI ulcers documented.  Could consider stopping H2RA and monitoring for symptoms. Similar w carafate.  Not unreasonable to continue PPI for GIB ppx on plavix in setting of advanced age w chronic NSAID use (Celebrex daily).   CAD: N/A antiplatelet monotherapy appropriate given proximity from stent placement. LDL overdue though not a top priority at present. Last in 2022 was 25 mg/dL. Remains on statin.   Psych: Previously established with psych, with dx PTSD, anxiety, BPD. Has been on current psych meds long-term given lack of follow up with psych. Patient denies concerns with mood since losing touch with psych. BPD, combination of antidepressant/antipsychotic is warranted. Could consider dose-reduction of fluoxetine given inconsistent dosing hx. Currently on 50 mg daily w two different capsule strengths (three 10mg  and one 20 mg = 4 capsules). Consider 40 mg daily (eg two 20 mg caps).   Sleep: Patient denies any concerns with sleep at all in the past year. Is on schedule trazodone at night.  Consider trazodone dose-reduction to 50 mg to see if  difference in sleep. If not likely can dc or at least remove from pill packs for PRN use only.   Alzheimer's : Degree of expected benefit is modest, and therapy should not be continued indefinitely in patients who do not appear to be benefiting or who have significant side effects. Patient denies side effects at this time, benefit unclear. Would benefit from re-establishing care to assess trajectory of disease/progression of changes.    Future Appointments  Date Time Provider Department Center  06/29/2023  1:00 PM Verna Czech [place], [place] [facility] None  07/13/2023 12:45 PM [facility] None  07/13/2023  1:00 PM Creig Hines, MD [facility] None    Loree Fee, PharmD Clinical Pharmacist [facility] [phone]

## 2023-06-17 ENCOUNTER — Ambulatory Visit: Payer: Self-pay | Admitting: *Deleted

## 2023-06-17 NOTE — Patient Outreach (Incomplete)
  Care Coordination   Initial Visit Note   06/18/2023 Name: Christopher Orr MRN: [id number] DOB: 05/26/1946  Christopher Orr is a 77 y.o. year old male who sees Dana Allan, MD for primary care. I spoke with  Michelle Nasuti by phone today.  What matters to the patients health and wellness today?  Food resources, in home care assistance, financial management assistance    Goals Addressed             This Visit's Progress    Community resources-personal care services, food resources       Activities and task to complete in order to accomplish goals.   Patient's daughter will discuss financial payee options with patient  REFERRAL PLACED BY SOCIAL WORK  [employer] Personal Care Services-will provide required request forms to patient's providers to initiate assessment process         SDOH assessments and interventions completed:  Yes  SDOH Interventions Today    Flowsheet Row Most Recent Value  SDOH Interventions   Food Insecurity Interventions --  [receives food stamps, referral to [employer]]  Housing Interventions Intervention Not Indicated  Transportation Interventions Intervention Not Indicated  Utilities Interventions Other (Comment)  [daughter agrees to assist with bill payment]        Care Coordination Interventions:  Yes, provided  Interventions Today    Flowsheet Row Most Recent Value  Chronic Disease   Chronic disease during today's visit Other  [dementia]  General Interventions   General Interventions Discussed/Reviewed General Interventions Discussed, [employer], Level of Care  [Needs assessment completed, pt's daughter concerned about pt's ability to maintain his finances, as well as his personal care(hygiene/personal care poor) and daily nutrition-]  Level of Care Personal Care Services  [discussed personal care options-will request that provider complete request form for an assessment for personal care services to assist with  bathing, feeding and dressing]  Mental Health Interventions   Mental Health Discussed/Reviewed Mental Health Discussed  [CSW to assist with referral to geri-psych]  Nutrition Interventions   Nutrition Discussed/Reviewed Nutrition Discussed  [patient receives food stamps, however concerns that patient may not be able cookk meals, referral to [employer] to be completed.]  Safety Interventions   Safety Discussed/Reviewed Safety Reviewed, Safety Discussed  [financial safety discussed, pt's daughter willing to consider becoming patient's financial payee to assist with managing patient's finances]       Follow up plan: Follow up call scheduled for 06/29/23    Encounter Outcome:  Patient Visit Completed

## 2023-06-18 NOTE — Patient Instructions (Signed)
Visit Information  Thank you for taking time to visit with me today. Please don't hesitate to contact me if I can be of assistance to you.   Following are the goals we discussed today:   Goals Addressed             This Visit's Progress    Community resources-personal care services, food resources       Activities and task to complete in order to accomplish goals.   Patient's daughter will discuss financial payee options with patient  REFERRAL PLACED BY SOCIAL WORK  [employer] Personal Care Services-will provide required request forms to patient's providers to initiate assessment process         Our next appointment is by telephone on 06/29/23 at 1pm  Please call the care guide team at [phone] if you need to cancel or reschedule your appointment.   If you are experiencing a Mental Health or Behavioral Health Crisis or need someone to talk to, please call 911   Patient verbalizes understanding of instructions and care plan provided today and agrees to view in MyChart. Active MyChart status and patient understanding of how to access instructions and care plan via MyChart confirmed with patient.     Telephone follow up appointment with care management team member scheduled for: 06/29/23  Verna Czech, LCSW [place]  [facility], [facility] Licensed Clinical Social Worker Care Coordinator  Direct Dial: [phone]

## 2023-06-23 ENCOUNTER — Other Ambulatory Visit: Payer: Self-pay | Admitting: Family Medicine

## 2023-06-23 DIAGNOSIS — I2510 Atherosclerotic heart disease of native coronary artery without angina pectoris: Secondary | ICD-10-CM

## 2023-06-23 DIAGNOSIS — K219 Gastro-esophageal reflux disease without esophagitis: Secondary | ICD-10-CM

## 2023-06-29 ENCOUNTER — Ambulatory Visit: Payer: Self-pay | Admitting: *Deleted

## 2023-06-29 NOTE — Patient Instructions (Signed)
Visit Information  Thank you for taking time to visit with me today. Please don't hesitate to contact me if I can be of assistance to you.   Following are the goals we discussed today:   Goals Addressed             This Visit's Progress    Community resources-personal care services, food resources       Activities and task to complete in order to accomplish goals.   Patient's daughter will discuss financial payee options with patient  REFERRAL PLACED BY SOCIAL WORK  [employer]-additional call made to follow up on status of referra; Personal Care Services-will provide required request forms to patient's providers to initiate assessment process         Our next appointment is by telephone on 07/08/23 at 1pm  Please call the care guide team at [phone] if you need to cancel or reschedule your appointment.   If you are experiencing a Mental Health or Behavioral Health Crisis or need someone to talk to, please call 911   Patient verbalizes understanding of instructions and care plan provided today and agrees to view in MyChart. Active MyChart status and patient understanding of how to access instructions and care plan via MyChart confirmed with patient.     Telephone follow up appointment with care management team member scheduled for: 07/08/23  Verna Czech, LCSW [place]  [facility], [facility] Licensed Clinical Social Worker Care Coordinator  Direct Dial: [phone]

## 2023-06-29 NOTE — Patient Outreach (Addendum)
  Care Coordination   Follow Up Visit Note   06/29/2023 Name: JOQUAN JANACEK MRN: [id number] DOB: Feb 28, 1946  GEFF MANKIN is a 77 y.o. year old male who sees Dana Allan, MD for primary care. I spoke with  Lorriane Shire daughter Baird Lyons by phone today.  What matters to the patients health and wellness today?  Food resources, in home care assistance, financial management assistance     Goals Addressed             This Visit's Progress    Community resources-personal care services, food resources       Activities and task to complete in order to accomplish goals.   Patient's daughter will discuss financial payee options with patient  REFERRAL PLACED BY SOCIAL WORK  [employer]-additional call made to follow up on status of referra; Personal Care Services-will provide required request forms to patient's providers to initiate assessment process         SDOH assessments and interventions completed:  No     Care Coordination Interventions:  Yes, provided  Interventions Today    Flowsheet Row Most Recent Value  Chronic Disease   Chronic disease during today's visit Other  [dementia]  General Interventions   General Interventions Discussed/Reviewed General Interventions Reviewed, [employer]  [continued concern related to patient's abilitty to manage his finances and personal care independently, as well as  daily nutrition]  Level of Care Personal Care Services  [will follow up with patient's provider regarding status of request form for personal care services]  Mental Health Interventions   Mental Health Discussed/Reviewed Other  [will follow up on status of referral to geri-psych]  Nutrition Interventions   Nutrition Discussed/Reviewed Nutrition Reviewed  [follow up phone call to [employer] regarding status of referral for meals]       Follow up plan: Follow up call scheduled for 07/08/23    Encounter Outcome:  Patient Visit Completed

## 2023-07-02 ENCOUNTER — Telehealth: Payer: Self-pay | Admitting: *Deleted

## 2023-07-02 NOTE — Patient Instructions (Signed)
Visit Information  Thank you for taking time to visit with me today. Please don't hesitate to contact me if I can be of assistance to you.   Following are the goals we discussed today:   Goals Addressed             This Visit's Progress    Community resources-personal care services, food resources       Activities and task to complete in order to accomplish goals.   Patient's daughter will discuss financial payee options with patient  REFERRAL PLACED BY SOCIAL WORK  [employer]-referral completed, patient has now been added to route; Personal Care Services-completed request forms from patient's providers pending         Our next appointment is by telephone on 07/08/23 at 1pm  Please call the care guide team at [phone] if you need to cancel or reschedule your appointment.   If you are experiencing a Mental Health or Behavioral Health Crisis or need someone to talk to, please call 911   Patient verbalizes understanding of instructions and care plan provided today and agrees to view in MyChart. Active MyChart status and patient understanding of how to access instructions and care plan via MyChart confirmed with patient.     Telephone follow up appointment with care management team member scheduled for: 07/08/23  Verna Czech, LCSW [place]  [facility], [facility] Licensed Clinical Social Worker Care Coordinator  Direct Dial: [phone]

## 2023-07-02 NOTE — Patient Outreach (Signed)
  Care Coordination   Follow Up Visit Note   07/02/2023 late entry Name: Christopher Orr MRN: [id number] DOB: 1946-03-12  Christopher Orr is a 77 y.o. year old male who sees Dana Allan, MD for primary care. I spoke with  Lorriane Shire daughter Baird Lyons by phone on 07/01/23  What matters to the patients health and wellness today?  Assistance with meals, in home care, and management of monthly income    Goals Addressed             This Visit's Progress    Community resources-personal care services, food resources       Activities and task to complete in order to accomplish goals.   Patient's daughter will discuss financial payee options with patient  REFERRAL PLACED BY SOCIAL WORK  [employer]-referral completed, patient has now been added to route; Personal Care Services-completed request forms from patient's providers pending         SDOH assessments and interventions completed:  No     Care Coordination Interventions:  Yes, provided  Interventions Today    Flowsheet Row Most Recent Value  Chronic Disease   Chronic disease during today's visit Other  [dementia]  General Interventions   General Interventions Discussed/Reviewed General Interventions Reviewed, Communication with  [confirmed weekly meal delivery through [employer] with pt's daughter, informed that pt's debit card has been compromised, card closed down and police report has been filed. Daughter to continue to assist with bill payments in the future]  Communication with --  [[employer]-delivery confirmed to be completed on 07/02/23 btwn 12:30pm and 1:30pm]  Nutrition Interventions   Nutrition Discussed/Reviewed Nutrition Discussed  [Pt will begin to receive weekly meals through [employer]]       Follow up plan: Follow up call scheduled for 07/08/23    Encounter Outcome:  Patient Visit Completed

## 2023-07-06 ENCOUNTER — Other Ambulatory Visit (HOSPITAL_COMMUNITY): Payer: Self-pay

## 2023-07-08 ENCOUNTER — Encounter: Payer: Self-pay | Admitting: *Deleted

## 2023-07-08 ENCOUNTER — Ambulatory Visit: Payer: Self-pay | Admitting: *Deleted

## 2023-07-08 NOTE — Telephone Encounter (Signed)
This encounter was created in error - please disregard.

## 2023-07-08 NOTE — Patient Instructions (Signed)
Visit Information  Thank you for taking time to visit with me today. Please don't hesitate to contact me if I can be of assistance to you.   Following are the goals we discussed today:   Goals Addressed             This Visit's Progress    Community resources-personal care services, food resources       Activities and task to complete in order to accomplish goals.   Patient's daughter will work to add self to patient's bank account (with patient's consent)  REFERRAL PLACED BY SOCIAL WORK  [employer]-patient to continue to receive meals weekly Personal Care Services-completed request forms from patient's providers pending         Our next appointment is by telephone on 07/24/23 at 2pm  Please call the care guide team at [phone] if you need to cancel or reschedule your appointment.   If you are experiencing a Mental Health or Behavioral Health Crisis or need someone to talk to, please call 911   Patient verbalizes understanding of instructions and care plan provided today and agrees to view in MyChart. Active MyChart status and patient understanding of how to access instructions and care plan via MyChart confirmed with patient.     Telephone follow up appointment with care management team member scheduled for: 07/24/23   Verna Czech, LCSW [place]  [facility], [facility] Health Licensed Clinical Social Worker Care Coordinator  Direct Dial: [phone]

## 2023-07-08 NOTE — Patient Outreach (Signed)
  Care Coordination   Follow Up Visit Note   07/08/2023 Name: KALYX SPADE MRN: [id number] DOB: Jul 09, 1946  DMITRIUS HENSLER is a 77 y.o. year old male who sees Dana Allan, MD for primary care. I spoke with  Lorriane Shire daughter  by phone today.  What matters to the patients health and wellness today?  Assistance with meals, in home care, and management of monthly income     Goals Addressed             This Visit's Progress    Community resources-personal care services, food resources       Activities and task to complete in order to accomplish goals.   Patient's daughter will work to add self to patient's bank account (with patient's consent)  REFERRAL PLACED BY SOCIAL WORK  [employer]-patient to continue to receive meals weekly Personal Care Services-completed request forms from patient's providers pending         SDOH assessments and interventions completed:  No     Care Coordination Interventions:  Yes, provided  Interventions Today    Flowsheet Row Most Recent Value  Chronic Disease   Chronic disease during today's visit Other  [dementia]  General Interventions   General Interventions Discussed/Reviewed General Interventions Reviewed, Community Resources, Level of Care  [confirmed that patient's debit card was compromised, police report made, daughter will add self to Bank Account to ensure household bills are paid timely]  Level of Care Personal Care Services  [request form for personal care to so be sent to patient's provider for completion for in home care support]  Nutrition Interventions   Nutrition Discussed/Reviewed Nutrition Discussed  [currently receiving meals through [employer]-looking foward to next delivery-[place]]  Safety Interventions   Safety Discussed/Reviewed Safety Discussed, Safety Reviewed  [discussed safety concerns, possibility ALF if condition continues to progress]       Follow up plan: Follow up call scheduled for  07/23/23    Encounter Outcome:  Patient Visit Completed

## 2023-07-10 ENCOUNTER — Other Ambulatory Visit: Payer: Self-pay

## 2023-07-10 DIAGNOSIS — Z08 Encounter for follow-up examination after completed treatment for malignant neoplasm: Secondary | ICD-10-CM

## 2023-07-13 ENCOUNTER — Inpatient Hospital Stay: Payer: Medicare HMO | Attending: Family Medicine

## 2023-07-13 ENCOUNTER — Inpatient Hospital Stay: Payer: Medicare HMO | Admitting: Oncology

## 2023-07-13 ENCOUNTER — Encounter: Payer: Self-pay | Admitting: Oncology

## 2023-07-14 ENCOUNTER — Encounter: Payer: Self-pay | Admitting: *Deleted

## 2023-07-14 NOTE — Telephone Encounter (Signed)
This encounter was created in error - please disregard.

## 2023-07-21 ENCOUNTER — Other Ambulatory Visit: Payer: Self-pay | Admitting: Family Medicine

## 2023-07-21 DIAGNOSIS — E785 Hyperlipidemia, unspecified: Secondary | ICD-10-CM

## 2023-07-24 ENCOUNTER — Ambulatory Visit: Payer: Self-pay | Admitting: *Deleted

## 2023-07-26 NOTE — Patient Outreach (Signed)
°  Care Coordination   Follow Up Visit Note   07/26/2023 Name: Christopher Orr MRN: [id number] DOB: 01-28-1946  Christopher Orr is a 76 y.o. year old male who sees Dana Allan, MD for primary care. I spoke with  Michelle Nasuti 's daughter by phone on 07/24/23.  What matters to the patients health and wellness today?  Personal care services and income management.    Goals Addressed             This Visit's Progress    Community resources-personal care services, food resources       Activities and task to complete in order to accomplish goals.   Patient's daughter will continue to manage patient's bank card and bill payment   REFERRAL PLACED BY SOCIAL WORK  [employer]-patient to continue to receive meals weekly Personal Care Services-completed request forms from patient's providers pending         SDOH assessments and interventions completed:  No     Care Coordination Interventions:  Yes, provided  Interventions Today    Flowsheet Row Most Recent Value  Chronic Disease   Chronic disease during today's visit --  [dementia]  General Interventions   General Interventions Discussed/Reviewed General Interventions Reviewed, [employer]  [Continued assessment of community resource needs, patient's daughter confirms that she continues to manage patient's money due to account being compromised, roommate has been arrested, plan discussed to avoid roommates return-may need formal eviction]  Level of Care Personal Care Services  [request form faxed to provider-remains pending]  Nutrition Interventions   Nutrition Discussed/Reviewed Nutrition Reviewed  [continues to receive meals through [employer] every Tuesday]  Safety Interventions   Safety Discussed/Reviewed Safety Reviewed  [confirmed that daughter continues to manage patient's money and assist with bill payment-daughter added to patient's bank account-roommate currenlty in jail-eviction process discussed]        Follow up plan: Follow up call scheduled for 08/14/23    Encounter Outcome:  Patient Visit Completed

## 2023-07-26 NOTE — Patient Instructions (Signed)
Visit Information  Thank you for taking time to visit with me today. Please don't hesitate to contact me if I can be of assistance to you.   Following are the goals we discussed today:   Goals Addressed             This Visit's Progress    Community resources-personal care services, food resources       Activities and task to complete in order to accomplish goals.   Patient's daughter will continue to manage patient's bank card and bill payment   REFERRAL PLACED BY SOCIAL WORK  [employer]-patient to continue to receive meals weekly Personal Care Services-completed request forms from patient's providers pending         Our next appointment is by telephone on 08/14/23 at 1pm  Please call the care guide team at [phone] if you need to cancel or reschedule your appointment.   If you are experiencing a Mental Health or Behavioral Health Crisis or need someone to talk to, please call 911   Patient verbalizes understanding of instructions and care plan provided today and agrees to view in MyChart. Active MyChart status and patient understanding of how to access instructions and care plan via MyChart confirmed with patient.     Telephone follow up appointment with care management team member scheduled for: 08/14/23  Verna Czech, LCSW [place]  [facility], [facility] Licensed Clinical Social Worker Care Coordinator  Direct Dial: [phone]

## 2023-07-28 ENCOUNTER — Other Ambulatory Visit: Payer: Self-pay

## 2023-07-28 DIAGNOSIS — R4189 Other symptoms and signs involving cognitive functions and awareness: Secondary | ICD-10-CM

## 2023-07-28 MED ORDER — ARIPIPRAZOLE 10 MG PO TABS: ORAL_TABLET | ORAL | 12 refills | Status: DC

## 2023-07-28 MED ORDER — MEMANTINE HCL 10 MG PO TABS
10.0000 mg | ORAL_TABLET | Freq: Two times a day (BID) | ORAL | 3 refills | Status: DC
Start: 2023-07-28 — End: 2024-02-11

## 2023-07-28 MED ORDER — MIRABEGRON ER 50 MG PO TB24: 50.0000 mg | ORAL_TABLET | Freq: Every day | ORAL | 12 refills | Status: DC

## 2023-08-14 ENCOUNTER — Encounter: Payer: Self-pay | Admitting: *Deleted

## 2023-08-19 ENCOUNTER — Encounter: Payer: Self-pay | Admitting: *Deleted

## 2023-08-19 NOTE — Patient Outreach (Signed)
  Care Coordination   Collaboration  Visit Note   08/19/2023 Name: Christopher Orr MRN: [id number] DOB: 1946-04-02  Christopher Orr is a 78 y.o. year old male who sees Dana Allan, MD for primary care. I  spoke with [facility] regarding request for an assessment for personal care services  What matters to the patients health and wellness today?  In home care services    Goals Addressed             This Visit's Progress    Community resources-personal care services, food resources       Activities and task to complete in order to accomplish goals.   Patient's daughter will continue to manage patient's bank card and bill payment   REFERRAL PLACED BY SOCIAL WORK  [employer]-patient to continue to receive meals weekly Personal Care Services-completed request forms faxed to [facility], they will contact patient's daughter to schedule in home assessment         SDOH assessments and interventions completed:  No     Care Coordination Interventions:  Yes, provided  Interventions Today    Flowsheet Row Most Recent Value  General Interventions   General Interventions Discussed/Reviewed Communication with, Level of Care  [Patient's daughter requesing in home care services for patient. Request form received from providers office and will be fowarded to [facility] for processing]  Communication with --  [[employer]-confirmed that request form for personal care services had not been received-re-faxed completed form to [phone]]       Follow up plan: Follow up call scheduled for 08/26/23    Encounter Outcome:  Patient Visit Completed

## 2023-08-20 ENCOUNTER — Other Ambulatory Visit: Payer: Self-pay | Admitting: Family Medicine

## 2023-08-20 DIAGNOSIS — K219 Gastro-esophageal reflux disease without esophagitis: Secondary | ICD-10-CM

## 2023-08-21 ENCOUNTER — Telehealth: Payer: Self-pay | Admitting: *Deleted

## 2023-08-21 NOTE — Patient Outreach (Signed)
  Care Coordination   08/21/2023 Name: Christopher Orr MRN: [id number] DOB: 07-17-1946   Care Coordination Outreach Attempts:  A second unsuccessful outreach was attempted today to offer the patient with information about available complex care management services.  Follow Up Plan:  Additional outreach attempts will be made to offer the patient complex care management information and services.   Encounter Outcome:  No Answer   Care Coordination Interventions:  No, not indicated    Makensie Mulhall, LCSW [place]  [facility], [facility] Licensed Clinical Social Worker Care Coordinator  Direct Dial: [phone]

## 2023-08-24 ENCOUNTER — Other Ambulatory Visit: Payer: Self-pay | Admitting: Family Medicine

## 2023-08-24 DIAGNOSIS — K219 Gastro-esophageal reflux disease without esophagitis: Secondary | ICD-10-CM

## 2023-08-24 DIAGNOSIS — I2510 Atherosclerotic heart disease of native coronary artery without angina pectoris: Secondary | ICD-10-CM

## 2023-08-26 ENCOUNTER — Ambulatory Visit: Payer: Self-pay | Admitting: *Deleted

## 2023-08-26 ENCOUNTER — Encounter: Payer: Self-pay | Admitting: *Deleted

## 2023-08-26 NOTE — Patient Outreach (Signed)
  Care Coordination   08/26/2023 Name: Christopher Orr MRN: [id number] DOB: 03-07-1946   Care Coordination Outreach Attempts:  A third unsuccessful outreach was attempted today to offer the patient with information about available complex care management services.  Follow Up Plan:  No further outreach attempts will be made at this time. We have been unable to contact the patient to offer or enroll patient in complex care management services.  Encounter Outcome:  No Answer   Care Coordination Interventions:  No, not indicated    Saman Giddens, LCSW [place]  [facility], [facility] Licensed Clinical Social Worker Care Coordinator  Direct Dial: [phone]

## 2023-09-16 ENCOUNTER — Other Ambulatory Visit: Payer: Self-pay | Admitting: Family Medicine

## 2023-09-16 DIAGNOSIS — E785 Hyperlipidemia, unspecified: Secondary | ICD-10-CM

## 2023-09-16 DIAGNOSIS — K219 Gastro-esophageal reflux disease without esophagitis: Secondary | ICD-10-CM

## 2023-09-16 DIAGNOSIS — I2510 Atherosclerotic heart disease of native coronary artery without angina pectoris: Secondary | ICD-10-CM

## 2023-09-16 DIAGNOSIS — M159 Polyosteoarthritis, unspecified: Secondary | ICD-10-CM

## 2023-09-21 ENCOUNTER — Telehealth: Payer: Self-pay

## 2023-09-21 ENCOUNTER — Other Ambulatory Visit: Payer: Self-pay

## 2023-09-21 DIAGNOSIS — K219 Gastro-esophageal reflux disease without esophagitis: Secondary | ICD-10-CM

## 2023-09-21 DIAGNOSIS — E785 Hyperlipidemia, unspecified: Secondary | ICD-10-CM

## 2023-09-21 DIAGNOSIS — I2510 Atherosclerotic heart disease of native coronary artery without angina pectoris: Secondary | ICD-10-CM

## 2023-09-21 MED ORDER — PANTOPRAZOLE SODIUM 40 MG PO TBEC
DELAYED_RELEASE_TABLET | ORAL | 0 refills | Status: DC
Start: 2023-09-21 — End: 2023-10-15

## 2023-09-21 MED ORDER — ROSUVASTATIN CALCIUM 10 MG PO TABS
10.0000 mg | ORAL_TABLET | Freq: Every day | ORAL | 0 refills | Status: DC
Start: 2023-09-21 — End: 2023-10-15

## 2023-09-21 MED ORDER — CLOPIDOGREL BISULFATE 75 MG PO TABS: 75.0000 mg | ORAL_TABLET | Freq: Every day | ORAL | 0 refills | Status: DC

## 2023-09-21 MED ORDER — SUCRALFATE 1 G PO TABS: 1.0000 g | ORAL_TABLET | Freq: Four times a day (QID) | ORAL | 0 refills | Status: DC

## 2023-09-21 MED ORDER — FAMOTIDINE 20 MG PO TABS: 20.0000 mg | ORAL_TABLET | Freq: Every day | ORAL | 0 refills | Status: DC

## 2023-09-21 NOTE — Telephone Encounter (Signed)
 Copied from CRM [id number]. Topic: Clinical - Prescription Issue >> Sep 21, 2023  2:29 PM Isabell A wrote: Reason for CRM: [employer] they received refills for all medications except celecoxib (CELEBREX) 100 MG capsule.  Callback number:   [phone]

## 2023-09-21 NOTE — Telephone Encounter (Signed)
 Copied from CRM [id number]. Topic: Clinical - Prescription Issue >> Sep 21, 2023  9:33 AM Drema Balzarine wrote: Reason for CRM: [facility] request a call back regarding patient medications, says they were denied due to "already responded to by other means" Please call and clarify at [phone] >> Sep 21, 2023  9:39 AM CMA Sarah A wrote: Wrong office

## 2023-09-21 NOTE — Telephone Encounter (Signed)
 Refill request sent to provider.

## 2023-09-21 NOTE — Telephone Encounter (Signed)
 Spoke to pharmacy and sent refill request to provider.

## 2023-09-22 MED ORDER — CELECOXIB 100 MG PO CAPS: 100.0000 mg | ORAL_CAPSULE | Freq: Every day | ORAL | 3 refills | Status: DC

## 2023-10-06 ENCOUNTER — Telehealth: Payer: Self-pay | Admitting: Family Medicine

## 2023-10-06 NOTE — Telephone Encounter (Signed)
 Called pt to call back and get appointment for a TOC slot due to provider Dana Allan will be leaving our office.

## 2023-10-14 ENCOUNTER — Other Ambulatory Visit: Payer: Self-pay | Admitting: Family Medicine

## 2023-10-14 DIAGNOSIS — I2510 Atherosclerotic heart disease of native coronary artery without angina pectoris: Secondary | ICD-10-CM

## 2023-10-14 DIAGNOSIS — K219 Gastro-esophageal reflux disease without esophagitis: Secondary | ICD-10-CM

## 2023-10-15 ENCOUNTER — Other Ambulatory Visit: Payer: Self-pay | Admitting: Family Medicine

## 2023-10-15 DIAGNOSIS — E785 Hyperlipidemia, unspecified: Secondary | ICD-10-CM

## 2023-11-11 ENCOUNTER — Telehealth: Payer: Self-pay | Admitting: Family Medicine

## 2023-11-11 NOTE — Telephone Encounter (Signed)
 Unable to reach pt by phone, straight to vm which was not set up. Sending message to MyChart, but he has not accessed since Oct 2024.   E2C2, if for some reason pt calls, please schedule a TOC. [facility]

## 2023-11-12 ENCOUNTER — Other Ambulatory Visit: Payer: Self-pay | Admitting: Family Medicine

## 2023-11-12 ENCOUNTER — Other Ambulatory Visit: Payer: Self-pay | Admitting: Medical

## 2023-11-12 DIAGNOSIS — K219 Gastro-esophageal reflux disease without esophagitis: Secondary | ICD-10-CM

## 2023-11-13 ENCOUNTER — Other Ambulatory Visit: Payer: Self-pay | Admitting: Medical

## 2023-12-09 ENCOUNTER — Other Ambulatory Visit: Payer: Self-pay | Admitting: Medical

## 2023-12-09 ENCOUNTER — Other Ambulatory Visit: Payer: Self-pay | Admitting: Family Medicine

## 2023-12-09 DIAGNOSIS — K219 Gastro-esophageal reflux disease without esophagitis: Secondary | ICD-10-CM

## 2023-12-11 ENCOUNTER — Telehealth: Payer: Self-pay | Admitting: Family Medicine

## 2023-12-11 ENCOUNTER — Other Ambulatory Visit: Payer: Self-pay

## 2023-12-11 ENCOUNTER — Telehealth: Payer: Self-pay | Admitting: Cardiovascular Disease

## 2023-12-11 DIAGNOSIS — K219 Gastro-esophageal reflux disease without esophagitis: Secondary | ICD-10-CM

## 2023-12-11 MED ORDER — METOPROLOL SUCCINATE ER 50 MG PO TB24: 50.0000 mg | ORAL_TABLET | Freq: Every day | ORAL | 0 refills | Status: DC

## 2023-12-11 MED ORDER — SUCRALFATE 1 G PO TABS
1.0000 g | ORAL_TABLET | Freq: Four times a day (QID) | ORAL | 0 refills | Status: DC
Start: 2023-12-11 — End: 2024-02-01

## 2023-12-11 MED ORDER — PANTOPRAZOLE SODIUM 40 MG PO TBEC: DELAYED_RELEASE_TABLET | ORAL | 0 refills | Status: DC

## 2023-12-11 MED ORDER — TRAZODONE HCL 100 MG PO TABS: 100.0000 mg | ORAL_TABLET | Freq: Every evening | ORAL | 3 refills | Status: DC | PRN

## 2023-12-11 NOTE — Telephone Encounter (Signed)
*  STAT* If patient is at the pharmacy, call can be transferred to refill team.   1. Which medications need to be refilled? (please list name of each medication and dose if known) metoprolol  succinate (TOPROL -XL) 50 MG 24 hr tablet   2. Which pharmacy/location (including street and city if local pharmacy) is medication to be sent to? [id number] - [place], [place] - [phone]   3. Do they need a 30 day or 90 day supply? 90

## 2023-12-11 NOTE — Telephone Encounter (Signed)
 Noted.

## 2023-12-11 NOTE — Telephone Encounter (Signed)
 RX request sent to provider.

## 2023-12-11 NOTE — Telephone Encounter (Unsigned)
 Copied from CRM [id number]. Topic: Clinical - Medication Refill >> Dec 11, 2023  2:19 PM Adrionna Y wrote: Medication: sucralfate  (CARAFATE ) 1 g tablet traZODone  (DESYREL ) 100 MG tablet pantoprazole  (PROTONIX ) 40 MG tablet  Has the patient contacted their pharmacy? Yes (Agent: If no, request that the patient contact the pharmacy for the refill. If patient does not wish to contact the pharmacy document the reason why and proceed with request.) (Agent: If yes, when and what did the pharmacy advise?)  This is the patient's preferred pharmacy:  [id number] - [place], [place] - [address] [place] [address] Phone: [phone] Fax: [phone]    Is this the correct pharmacy for this prescription? Yes If no, delete pharmacy and type the correct one.   Has the prescription been filled recently? No  Is the patient out of the medication? No  Has the patient been seen for an appointment in the last year OR does the patient have an upcoming appointment? Yes  Can we respond through MyChart? Yes  Agent: Please be advised that Rx refills may take up to 3 business days. We ask that you follow-up with your pharmacy.

## 2023-12-11 NOTE — Telephone Encounter (Signed)
 Pt's medication was sent to pt's pharmacy as requested. Confirmation received.

## 2023-12-16 ENCOUNTER — Other Ambulatory Visit: Payer: Self-pay | Admitting: Family Medicine

## 2023-12-16 DIAGNOSIS — E785 Hyperlipidemia, unspecified: Secondary | ICD-10-CM

## 2023-12-29 ENCOUNTER — Other Ambulatory Visit: Payer: Self-pay | Admitting: Cardiovascular Disease

## 2024-01-07 ENCOUNTER — Other Ambulatory Visit: Payer: Self-pay | Admitting: Family Medicine

## 2024-01-07 DIAGNOSIS — K219 Gastro-esophageal reflux disease without esophagitis: Secondary | ICD-10-CM

## 2024-01-07 DIAGNOSIS — I2510 Atherosclerotic heart disease of native coronary artery without angina pectoris: Secondary | ICD-10-CM

## 2024-01-11 ENCOUNTER — Other Ambulatory Visit: Payer: Self-pay | Admitting: Cardiovascular Disease

## 2024-01-31 ENCOUNTER — Other Ambulatory Visit: Payer: Self-pay

## 2024-01-31 ENCOUNTER — Emergency Department

## 2024-01-31 ENCOUNTER — Inpatient Hospital Stay
Admission: EM | Admit: 2024-01-31 | Discharge: 2024-02-04 | DRG: 286 | Disposition: A | Discharge status: Home Health | Attending: Obstetrics and Gynecology | Admitting: Obstetrics and Gynecology

## 2024-01-31 DIAGNOSIS — I309 Acute pericarditis, unspecified: Principal | ICD-10-CM | POA: Diagnosis present

## 2024-01-31 DIAGNOSIS — C3490 Malignant neoplasm of unspecified part of unspecified bronchus or lung: Secondary | ICD-10-CM | POA: Diagnosis present

## 2024-01-31 DIAGNOSIS — Z955 Presence of coronary angioplasty implant and graft: Secondary | ICD-10-CM

## 2024-01-31 DIAGNOSIS — I5023 Acute on chronic systolic (congestive) heart failure: Secondary | ICD-10-CM | POA: Diagnosis not present

## 2024-01-31 DIAGNOSIS — F0393 Unspecified dementia, unspecified severity, with mood disturbance: Secondary | ICD-10-CM | POA: Diagnosis present

## 2024-01-31 DIAGNOSIS — E782 Mixed hyperlipidemia: Secondary | ICD-10-CM | POA: Diagnosis not present

## 2024-01-31 DIAGNOSIS — F4310 Post-traumatic stress disorder, unspecified: Secondary | ICD-10-CM | POA: Diagnosis present

## 2024-01-31 DIAGNOSIS — J449 Chronic obstructive pulmonary disease, unspecified: Secondary | ICD-10-CM | POA: Diagnosis present

## 2024-01-31 DIAGNOSIS — F0390 Unspecified dementia without behavioral disturbance: Secondary | ICD-10-CM | POA: Insufficient documentation

## 2024-01-31 DIAGNOSIS — F1010 Alcohol abuse, uncomplicated: Secondary | ICD-10-CM | POA: Diagnosis present

## 2024-01-31 DIAGNOSIS — Z800 Family history of malignant neoplasm of digestive organs: Secondary | ICD-10-CM

## 2024-01-31 DIAGNOSIS — I308 Other forms of acute pericarditis: Secondary | ICD-10-CM | POA: Diagnosis not present

## 2024-01-31 DIAGNOSIS — I428 Other cardiomyopathies: Secondary | ICD-10-CM | POA: Diagnosis present

## 2024-01-31 DIAGNOSIS — Z8249 Family history of ischemic heart disease and other diseases of the circulatory system: Secondary | ICD-10-CM

## 2024-01-31 DIAGNOSIS — Z813 Family history of other psychoactive substance abuse and dependence: Secondary | ICD-10-CM

## 2024-01-31 DIAGNOSIS — E440 Moderate protein-calorie malnutrition: Secondary | ICD-10-CM | POA: Diagnosis present

## 2024-01-31 DIAGNOSIS — K7460 Unspecified cirrhosis of liver: Secondary | ICD-10-CM | POA: Diagnosis not present

## 2024-01-31 DIAGNOSIS — Z825 Family history of asthma and other chronic lower respiratory diseases: Secondary | ICD-10-CM

## 2024-01-31 DIAGNOSIS — I959 Hypotension, unspecified: Secondary | ICD-10-CM | POA: Diagnosis present

## 2024-01-31 DIAGNOSIS — I4891 Unspecified atrial fibrillation: Secondary | ICD-10-CM | POA: Insufficient documentation

## 2024-01-31 DIAGNOSIS — I5181 Takotsubo syndrome: Secondary | ICD-10-CM | POA: Diagnosis present

## 2024-01-31 DIAGNOSIS — I214 Non-ST elevation (NSTEMI) myocardial infarction: Secondary | ICD-10-CM | POA: Diagnosis not present

## 2024-01-31 DIAGNOSIS — R079 Chest pain, unspecified: Secondary | ICD-10-CM | POA: Diagnosis not present

## 2024-01-31 DIAGNOSIS — I130 Hypertensive heart and chronic kidney disease with heart failure and stage 1 through stage 4 chronic kidney disease, or unspecified chronic kidney disease: Secondary | ICD-10-CM | POA: Diagnosis present

## 2024-01-31 DIAGNOSIS — Z79899 Other long term (current) drug therapy: Secondary | ICD-10-CM

## 2024-01-31 DIAGNOSIS — N1831 Chronic kidney disease, stage 3a: Secondary | ICD-10-CM | POA: Diagnosis present

## 2024-01-31 DIAGNOSIS — E785 Hyperlipidemia, unspecified: Secondary | ICD-10-CM | POA: Diagnosis present

## 2024-01-31 DIAGNOSIS — Z6828 Body mass index (BMI) 28.0-28.9, adult: Secondary | ICD-10-CM

## 2024-01-31 DIAGNOSIS — I319 Disease of pericardium, unspecified: Secondary | ICD-10-CM | POA: Diagnosis not present

## 2024-01-31 DIAGNOSIS — Z981 Arthrodesis status: Secondary | ICD-10-CM

## 2024-01-31 DIAGNOSIS — Z818 Family history of other mental and behavioral disorders: Secondary | ICD-10-CM

## 2024-01-31 DIAGNOSIS — I480 Paroxysmal atrial fibrillation: Secondary | ICD-10-CM | POA: Diagnosis present

## 2024-01-31 DIAGNOSIS — J984 Other disorders of lung: Secondary | ICD-10-CM | POA: Diagnosis not present

## 2024-01-31 DIAGNOSIS — Z7982 Long term (current) use of aspirin: Secondary | ICD-10-CM | POA: Diagnosis not present

## 2024-01-31 DIAGNOSIS — Z8619 Personal history of other infectious and parasitic diseases: Secondary | ICD-10-CM

## 2024-01-31 DIAGNOSIS — Z7902 Long term (current) use of antithrombotics/antiplatelets: Secondary | ICD-10-CM | POA: Diagnosis not present

## 2024-01-31 DIAGNOSIS — F319 Bipolar disorder, unspecified: Secondary | ICD-10-CM | POA: Diagnosis present

## 2024-01-31 DIAGNOSIS — F17210 Nicotine dependence, cigarettes, uncomplicated: Secondary | ICD-10-CM | POA: Diagnosis present

## 2024-01-31 DIAGNOSIS — Z720 Tobacco use: Secondary | ICD-10-CM | POA: Diagnosis present

## 2024-01-31 DIAGNOSIS — Z8546 Personal history of malignant neoplasm of prostate: Secondary | ICD-10-CM

## 2024-01-31 DIAGNOSIS — I2510 Atherosclerotic heart disease of native coronary artery without angina pectoris: Secondary | ICD-10-CM | POA: Diagnosis not present

## 2024-01-31 DIAGNOSIS — Z85118 Personal history of other malignant neoplasm of bronchus and lung: Secondary | ICD-10-CM

## 2024-01-31 DIAGNOSIS — R0602 Shortness of breath: Secondary | ICD-10-CM | POA: Diagnosis not present

## 2024-01-31 DIAGNOSIS — Z811 Family history of alcohol abuse and dependence: Secondary | ICD-10-CM

## 2024-01-31 DIAGNOSIS — I429 Cardiomyopathy, unspecified: Secondary | ICD-10-CM | POA: Diagnosis not present

## 2024-01-31 DIAGNOSIS — M1990 Unspecified osteoarthritis, unspecified site: Secondary | ICD-10-CM | POA: Diagnosis present

## 2024-01-31 DIAGNOSIS — Z7901 Long term (current) use of anticoagulants: Secondary | ICD-10-CM

## 2024-01-31 DIAGNOSIS — I300 Acute nonspecific idiopathic pericarditis: Secondary | ICD-10-CM

## 2024-01-31 DIAGNOSIS — N401 Enlarged prostate with lower urinary tract symptoms: Secondary | ICD-10-CM | POA: Diagnosis present

## 2024-01-31 DIAGNOSIS — R350 Frequency of micturition: Secondary | ICD-10-CM | POA: Diagnosis present

## 2024-01-31 LAB — PROTIME-INR
INR: 1 (ref 0.8–1.2)
Prothrombin Time: 14.2 s (ref 11.4–15.2)

## 2024-01-31 LAB — BASIC METABOLIC PANEL WITH GFR
Anion gap: 9 (ref 5–15)
BUN: 13 mg/dL (ref 8–23)
CO2: 26 mmol/L (ref 22–32)
Calcium: 9.4 mg/dL (ref 8.9–10.3)
Chloride: 103 mmol/L (ref 98–111)
Creatinine, Ser: 1.25 mg/dL — ABNORMAL HIGH (ref 0.61–1.24)
GFR, Estimated: 59 mL/min — ABNORMAL LOW (ref >60)
Glucose, Bld: 136 mg/dL — ABNORMAL HIGH (ref 70–99)
Potassium: 4.2 mmol/L (ref 3.5–5.1)
Sodium: 138 mmol/L (ref 135–145)

## 2024-01-31 LAB — CBC
HCT: 40.3 % (ref 39.0–52.0)
Hemoglobin: 13.6 g/dL (ref 13.0–17.0)
MCH: 31.7 pg (ref 26.0–34.0)
MCHC: 33.7 g/dL (ref 30.0–36.0)
MCV: 93.9 fL (ref 80.0–100.0)
Platelets: 428 K/uL — ABNORMAL HIGH (ref 150–400)
RBC: 4.29 MIL/uL (ref 4.22–5.81)
RDW: 14.1 % (ref 11.5–15.5)
WBC: 16.7 K/uL — ABNORMAL HIGH (ref 4.0–10.5)
nRBC: 0 % (ref 0.0–0.2)

## 2024-01-31 LAB — TROPONIN I (HIGH SENSITIVITY)
Troponin I (High Sensitivity): 4107 ng/L (ref <18)
Troponin I (High Sensitivity): 3623 ng/L (ref <18)

## 2024-01-31 LAB — MRSA NEXT GEN BY PCR, NASAL: MRSA by PCR Next Gen: NOT DETECTED

## 2024-01-31 LAB — GLUCOSE, CAPILLARY: Glucose-Capillary: 113 mg/dL — ABNORMAL HIGH (ref 70–99)

## 2024-01-31 MED ORDER — SODIUM CHLORIDE 0.9 % IV BOLUS
250.0000 mL | Freq: Once | INTRAVENOUS | Status: AC
  Administered 2024-01-31: 250 mL via INTRAVENOUS

## 2024-01-31 MED ORDER — ROSUVASTATIN CALCIUM 10 MG PO TABS
10.0000 mg | ORAL_TABLET | Freq: Every day | ORAL | Status: DC
  Administered 2024-01-31 – 2024-02-03 (×4): 10 mg via ORAL
  Filled 2024-01-31 (×4): qty 1

## 2024-01-31 MED ORDER — FLUOXETINE HCL 20 MG PO CAPS
30.0000 mg | ORAL_CAPSULE | Freq: Every day | ORAL | Status: DC
  Administered 2024-01-31 – 2024-02-04 (×5): 30 mg via ORAL
  Filled 2024-01-31 (×6): qty 1

## 2024-01-31 MED ORDER — ONDANSETRON HCL 4 MG/2ML IJ SOLN: 4.0000 mg | Freq: Four times a day (QID) | INTRAMUSCULAR | Status: DC | PRN

## 2024-01-31 MED ORDER — LACTATED RINGERS IV BOLUS
500.0000 mL | Freq: Once | INTRAVENOUS | Status: AC
  Administered 2024-01-31: 500 mL via INTRAVENOUS

## 2024-01-31 MED ORDER — DONEPEZIL HCL 5 MG PO TABS
5.0000 mg | ORAL_TABLET | Freq: Every day | ORAL | Status: DC
  Administered 2024-01-31 – 2024-02-03 (×4): 5 mg via ORAL
  Filled 2024-01-31 (×4): qty 1

## 2024-01-31 MED ORDER — MIRABEGRON ER 50 MG PO TB24
50.0000 mg | ORAL_TABLET | Freq: Every day | ORAL | Status: DC
  Administered 2024-01-31 – 2024-02-04 (×5): 50 mg via ORAL
  Filled 2024-01-31 (×6): qty 1

## 2024-01-31 MED ORDER — MELATONIN 5 MG PO TABS
2.5000 mg | ORAL_TABLET | Freq: Every day | ORAL | Status: DC
  Administered 2024-01-31 – 2024-02-03 (×4): 2.5 mg via ORAL
  Filled 2024-01-31 (×4): qty 1

## 2024-01-31 MED ORDER — ASPIRIN 81 MG PO TBEC
81.0000 mg | DELAYED_RELEASE_TABLET | Freq: Every day | ORAL | Status: DC
  Administered 2024-02-01 – 2024-02-02 (×2): 81 mg via ORAL
  Filled 2024-01-31 (×2): qty 1

## 2024-01-31 MED ORDER — ARIPIPRAZOLE 10 MG PO TABS
10.0000 mg | ORAL_TABLET | Freq: Every day | ORAL | Status: DC
  Administered 2024-01-31 – 2024-02-03 (×4): 10 mg via ORAL
  Filled 2024-01-31 (×5): qty 1

## 2024-01-31 MED ORDER — MEMANTINE HCL 10 MG PO TABS
10.0000 mg | ORAL_TABLET | Freq: Two times a day (BID) | ORAL | Status: DC
  Administered 2024-01-31 – 2024-02-04 (×8): 10 mg via ORAL
  Filled 2024-01-31 (×4): qty 1
  Filled 2024-01-31: qty 2
  Filled 2024-01-31 (×4): qty 1

## 2024-01-31 MED ORDER — SODIUM CHLORIDE 0.9 % IV SOLN: INTRAVENOUS | Status: DC

## 2024-01-31 MED ORDER — PANTOPRAZOLE SODIUM 40 MG PO TBEC
40.0000 mg | DELAYED_RELEASE_TABLET | Freq: Every day | ORAL | Status: DC
  Administered 2024-01-31 – 2024-02-04 (×5): 40 mg via ORAL
  Filled 2024-01-31 (×5): qty 1

## 2024-01-31 MED ORDER — TRAZODONE HCL 100 MG PO TABS
100.0000 mg | ORAL_TABLET | Freq: Every evening | ORAL | Status: DC | PRN
  Administered 2024-01-31 – 2024-02-03 (×2): 100 mg via ORAL
  Filled 2024-01-31 (×2): qty 1

## 2024-01-31 MED ORDER — ACETAMINOPHEN 325 MG PO TABS: 650.0000 mg | ORAL_TABLET | ORAL | Status: DC | PRN

## 2024-01-31 MED ORDER — ASPIRIN 81 MG PO CHEW
324.0000 mg | CHEWABLE_TABLET | Freq: Once | ORAL | Status: AC
  Administered 2024-01-31: 324 mg via ORAL
  Filled 2024-01-31: qty 4

## 2024-01-31 MED ORDER — HEPARIN (PORCINE) 25000 UT/250ML-% IV SOLN
1550.0000 [IU]/h | INTRAVENOUS | Status: DC
  Administered 2024-01-31: 1350 [IU]/h via INTRAVENOUS
  Filled 2024-01-31: qty 250

## 2024-01-31 MED ORDER — CHLORHEXIDINE GLUCONATE CLOTH 2 % EX PADS
6.0000 | MEDICATED_PAD | Freq: Every day | CUTANEOUS | Status: DC
  Administered 2024-01-31 – 2024-02-03 (×2): 6 via TOPICAL

## 2024-01-31 MED ORDER — NITROGLYCERIN 0.4 MG SL SUBL: 0.4000 mg | SUBLINGUAL_TABLET | SUBLINGUAL | Status: DC | PRN

## 2024-01-31 MED ORDER — MORPHINE SULFATE (PF) 2 MG/ML IV SOLN
2.0000 mg | INTRAVENOUS | Status: DC | PRN
  Administered 2024-01-31 – 2024-02-01 (×4): 2 mg via INTRAVENOUS
  Administered 2024-02-01: 4 mg via INTRAVENOUS
  Administered 2024-02-01 (×5): 2 mg via INTRAVENOUS
  Filled 2024-01-31 (×6): qty 1
  Filled 2024-01-31: qty 2
  Filled 2024-01-31 (×3): qty 1

## 2024-01-31 MED ORDER — ORAL CARE MOUTH RINSE: 15.0000 mL | OROMUCOSAL | Status: DC | PRN

## 2024-01-31 MED ORDER — SODIUM CHLORIDE 0.9 % IV BOLUS
500.0000 mL | Freq: Once | INTRAVENOUS | Status: AC
  Administered 2024-01-31: 500 mL via INTRAVENOUS

## 2024-01-31 MED ORDER — SODIUM CHLORIDE 0.9 % IV BOLUS: 250.0000 mL | Freq: Once | INTRAVENOUS | Status: DC

## 2024-01-31 MED ORDER — MORPHINE SULFATE (PF) 4 MG/ML IV SOLN
4.0000 mg | Freq: Once | INTRAVENOUS | Status: AC
  Administered 2024-01-31: 4 mg via INTRAVENOUS
  Filled 2024-01-31: qty 1

## 2024-01-31 MED ORDER — NICOTINE 21 MG/24HR TD PT24
21.0000 mg | MEDICATED_PATCH | Freq: Every day | TRANSDERMAL | Status: DC
  Administered 2024-01-31 – 2024-02-04 (×5): 21 mg via TRANSDERMAL
  Filled 2024-01-31 (×5): qty 1

## 2024-01-31 MED ORDER — HEPARIN BOLUS VIA INFUSION
4000.0000 [IU] | Freq: Once | INTRAVENOUS | Status: AC
  Administered 2024-01-31: 4000 [IU] via INTRAVENOUS
  Filled 2024-01-31: qty 4000

## 2024-01-31 NOTE — Assessment & Plan Note (Addendum)
 Patient presented with chest pain that was left sided and substernal. He denied nausea or diaphoresis, but admitted to shortness of breath. EKG demonstrated only old inferior and anterio-inferior infarcts with old IVCD and LAD. Troponin was 4107. Cardiology was called by ED physician. The patient will be admitted and kept NPO after midnight. The plan is for [facility] tomorrow. The patient has known CAD and underwent PCI with stent placement in 2006. The patient is on plavix  at home.

## 2024-01-31 NOTE — ED Notes (Signed)
 Advised nurse that patient has ready bed

## 2024-01-31 NOTE — Progress Notes (Signed)
 PHARMACY - ANTICOAGULATION CONSULT NOTE  Pharmacy Consult for heparin  Indication: chest pain/ACS  No Known Allergies  Patient Measurements: Height: 6' (182.9 cm) Weight: 113.4 kg (250 lb) IBW/kg (Calculated) : 77.6 HEPARIN  DW (KG): 101.9  Vital Signs: Temp: 98 F (36.7 C) (07/06 1434) BP: 111/92 (07/06 1434) Pulse Rate: 130 (07/06 1434)  Labs: Recent Labs    01/31/24 1434  HGB 13.6  HCT 40.3  PLT 428*  CREATININE 1.25*  TROPONINIHS 4,107*    Estimated Creatinine Clearance: 64.3 mL/min (A) (by C-G formula based on SCr of 1.25 mg/dL (H)).   Medical History: Past Medical History:  Diagnosis Date   Abnormal findings on diagnostic imaging of digestive system    Acute kidney injury (HCC) 07/28/2014   Alcohol  abuse    pt states it is marijuana   Anemia    Arthritis    Arthritis 08/15/2019   Aspiration pneumonia (HCC) 07/28/2014   Bilateral inguinal hernia, without obstruction or gangrene, recurrent    Bipolar disorder (HCC)    BPH (benign prostatic hypertrophy) with urinary obstruction    CAD (coronary artery disease)    a. 2006 PCI/DES to LAD, EF 60%.   Chest pain 07/28/2014   Chronic heart failure with preserved ejection fraction (HFpEF) (HCC)    a. 07/2014 Echo: EF 55%; b. 11/2020 Echo: EF 50-55%, mod LVH, GrI DD, mildly enlarged RV w/ nl fxn, mild BAE.   Chronic pancreatitis (HCC)    Chronic viral hepatitis C (HCC) 09/24/2014   Overview:   GT 1a.   started on Harvoni 11/03/2014 for planned 12 week course, elastography shows cirrhosis.   Cigarette smoker 08/05/2018   Cirrhosis (HCC)    Cirrhosis of liver (HCC) 08/15/2019   Noted in 2016 imaging with alcohol  abuse and h/o hep C tx s/p harvoni       COPD (chronic obstructive pulmonary disease) (HCC)    Coronary artery disease involving native heart with angina pectoris (HCC) 05/31/2018   COVID-19    08/22/20   Dementia (HCC)    early dementia   Depression    Depression, recurrent (HCC) 06/14/2018   Dilated  ascending aorta and aortic root (HCC)    a. 11/2020 Echo: Ao root 44mm, Asc Ao 40mm. Ao arch 37mm.   Diverticulitis    12/06/20 [facility]    Diverticulosis    Drug use    Dyspnea    ED (erectile dysfunction)    Elevated troponin I level 07/28/2014   Gastritis    GERD (gastroesophageal reflux disease)    Headache    occasional migraines   Hepatitis C    treated   Hernia, inguinal, bilateral 08/15/2019   Recurrent s/p repair       History of lumbar fusion    History of pancreatitis 08/15/2019   X 2    History of prostate cancer 08/15/2019   2017 s/p brachytherapy with seeds   F/u Dr. Penne       Hyperlipidemia    Hypertension    patient denies having high blood pressure   Internal hemorrhoids 01/29/2021   Leg edema 01/03/2022   Lung cancer (HCC)    Malignant neoplasm of lower lobe of left lung (HCC) 05/15/2020   Memory loss 08/15/2019   Nodule of lower lobe of left lung 11/09/2019   Nodule of middle lobe of right lung 08/15/2019   Other chronic pain 01/29/2021   Pancreatitis    x 2    Pneumonia    07/06/18    Polyp  of transverse colon    Poor nutrition 06/09/2022   Premature atrial contractions    a. 04/2021 [facility]: Freq PACs w/ 8% burden.   Prostate cancer [facility])    with seeds   PSVT (paroxysmal supraventricular tachycardia) (HCC)    a. 04/2021 [facility]: Avg HR 80 (60-210). 42 SVT runs - longest 5 beats @ 116, fastest 210 x 5 beats. Freq PACs (8%).   PTSD (post-traumatic stress disorder)    Rib fracture    s/p fall 03/15/19    Right ear pain 07/18/2021   S/P lumbar fusion 12/27/2020   Sinus bradycardia 01/29/2021   Sinus disease    SOB (shortness of breath) on exertion 05/15/2020   Squamous carcinoma of lung, left (HCC) 11/26/2019   Syncope 01/29/2021   Syncope due to orthostatic hypotension 01/23/2021   Umbilical hernia without obstruction and without gangrene    Urge incontinence    Viral upper respiratory tract infection 07/18/2021    Medications:  Scheduled:    aspirin   324 mg Oral Once    morphine  injection  4 mg Intravenous Once   Assessment: 78 y/o male presenting with chest pain - found to have elevated troponin levels in ED. PMH significant for COPD, CAD s/p PCI in 2006, HTN, HLD. Pharmacy has been consulted to initiate heparin  infusion. Per chart review, patient not on anticoagulation prior to admission.  Baseline labs: hgb 13.6, plt 428, INR pending  Goal of Therapy:  Heparin  level 0.3-0.7 units/ml Monitor platelets by anticoagulation protocol: Yes   Plan:  Give 4000 units bolus x 1 Start heparin  infusion at 1350 units/hr Check anti-Xa level in 8 hours and daily while on heparin  Continue to monitor H&H and platelets  Thank you for involving pharmacy in this patient's care.   Damien Napoleon, PharmD Clinical Pharmacist 01/31/2024 3:24 PM

## 2024-01-31 NOTE — ED Notes (Signed)
 Primary RN made aware pt in room and high acuity and critical result

## 2024-01-31 NOTE — Assessment & Plan Note (Signed)
 Continue namenda  and aricept  as prior to admission.

## 2024-01-31 NOTE — Assessment & Plan Note (Signed)
 Continue crestor  10 mg daily as prior to admission. Lipid panel to be drawn in the am.

## 2024-01-31 NOTE — ED Provider Notes (Signed)
 [facility] Provider Note    Event Date/Time   First MD Initiated Contact with Patient 01/31/24 1510     (approximate)   History   Chest Pain   HPI Christopher Orr is a 78 y.o. male with history of COPD, CAD s/p PCI in 2006, HTN, HLD presenting today for chest pain.  Patient states around 10 AM he had onset of mid/left-sided chest pain which has been constant since then.  Occurred at rest.  Does not radiate to the arms or neck.  Associated with shortness of breath.  No nausea, vomiting, or diaphoresis.  Denies any recent cough, congestion, or fevers.  No leg pain or leg swelling.  Patient states that he thinks he is on Eliquis  but does not have remember.  Also admits  likely missed doses of any anticoagulation/antiplatelet that he is on.  Reviewed prior cardiology notes.     Physical Exam   Triage Vital Signs: ED Triage Vitals  Encounter Vitals Group     BP 01/31/24 1434 (!) 111/92     Girls Systolic BP Percentile --      Girls Diastolic BP Percentile --      Boys Systolic BP Percentile --      Boys Diastolic BP Percentile --      Pulse Rate 01/31/24 1434 (!) 130     Resp 01/31/24 1434 18     Temp 01/31/24 1434 98 F (36.7 C)     Temp src --      SpO2 01/31/24 1434 100 %     Weight 01/31/24 1432 250 lb (113.4 kg)     Height 01/31/24 1432 6' (1.829 m)     Head Circumference --      Peak Flow --      Pain Score 01/31/24 1432 8     Pain Loc --      Pain Education --      Exclude from Growth Chart --     Most recent vital signs: Vitals:   01/31/24 1434  BP: (!) 111/92  Pulse: (!) 130  Resp: 18  Temp: 98 F (36.7 C)  SpO2: 100%   Physical Exam: I have reviewed the vital signs and nursing notes. General: Awake, alert, no acute distress.  Nontoxic appearing. Head:  Atraumatic, normocephalic.   ENT:  EOM intact, PERRL. Oral mucosa is pink and moist with no lesions. Neck: Neck is supple with full range of motion, No meningeal  signs. Cardiovascular:  RRR, No murmurs. Peripheral pulses palpable and equal bilaterally. Respiratory:  Symmetrical chest wall expansion.  No rhonchi, rales, or wheezes.  Good air movement throughout.  No use of accessory muscles.   Musculoskeletal:  No cyanosis or edema. Moving extremities with full ROM Abdomen:  Soft, nontender, nondistended. Neuro:  GCS 15, moving all four extremities, interacting appropriately. Speech clear. Psych:  Calm, appropriate.   Skin:  Warm, dry, no rash.    ED Results / Procedures / Treatments   Labs (all labs ordered are listed, but only abnormal results are displayed) Labs Reviewed  BASIC METABOLIC PANEL WITH GFR - Abnormal; Notable for the following components:      Result Value   Glucose, Bld 136 (*)    Creatinine, Ser 1.25 (*)    GFR, Estimated 59 (*)    All other components within normal limits  CBC - Abnormal; Notable for the following components:   WBC 16.7 (*)    Platelets 428 (*)    All  other components within normal limits  TROPONIN I (HIGH SENSITIVITY) - Abnormal; Notable for the following components:   Troponin I (High Sensitivity) 4,107 (*)    All other components within normal limits     EKG My EKG interpretation: Rate of 125, sinus tachycardia with left axis deviation.  No obvious ST elevation or depression   RADIOLOGY Independently interpreted chest x-ray with no acute pathology   PROCEDURES:  Critical Care performed: Yes, see critical care procedure note(s)  .Critical Care  Performed by: Malvina Alm DASEN, MD Authorized by: Malvina Alm DASEN, MD   Critical care provider statement:    Critical care time (minutes):  30   Critical care was necessary to treat or prevent imminent or life-threatening deterioration of the following conditions:  Cardiac failure   Critical care was time spent personally by me on the following activities:  Development of treatment plan with patient or surrogate, discussions with consultants,  evaluation of patient's response to treatment, examination of patient, ordering and review of laboratory studies, ordering and review of radiographic studies, ordering and performing treatments and interventions, pulse oximetry, re-evaluation of patient's condition and review of old charts   I assumed direction of critical care for this patient from another provider in my specialty: no     Care discussed with: admitting provider      MEDICATIONS ORDERED IN ED: Medications - No data to display   IMPRESSION / MDM / ASSESSMENT AND PLAN / ED COURSE  I reviewed the triage vital signs and the nursing notes.                              Differential diagnosis includes, but is not limited to, ACS, pneumonia, pneumothorax, lower suspicion gastritis or PE  Patient's presentation is most consistent with acute presentation with potential threat to life or bodily function.  Patient is a 78 year old male presenting today for constant chest pain since 10 AM associated with shortness of breath.  Mild tachycardia on arrival but EKG without obvious STEMI.  Laboratory workup with slight leukocytosis as well as mild AKI from his baseline.  Most notably his troponin is elevated at 4107.  I had concern for NSTEMI at this time and given 325 mg aspirin  and started on heparin  drip.  Given morphine  for pain symptoms given slightly lower blood pressure at 111/92.  Consulted cardiology, Dr. Lonni, who agrees with current plan as well as admission to hospitalist, n.p.o. at midnight with plan for heart cath tomorrow.  Discussed with hospitalist and any worsening chest pain or laboratory/EKG findings may prompt more urgent catheterization.  The patient is on the cardiac monitor to evaluate for evidence of arrhythmia and/or significant heart rate changes. Clinical Course as of 01/31/24 1546  Sun Jan 31, 2024  1526 Cardiology - npo at midnight with potential cath, monitor pain symptoms overnight [DW]    Clinical  Course User Index [DW] Malvina Alm DASEN, MD     FINAL CLINICAL IMPRESSION(S) / ED DIAGNOSES   Final diagnoses:  None     Rx / DC Orders   ED Discharge Orders     None        Note:  This document was prepared using Dragon voice recognition software and may include unintentional dictation errors.   Malvina Alm DASEN, MD 01/31/24 [phone]

## 2024-01-31 NOTE — H&P (Signed)
 History and Physical    Patient: Christopher Orr FMW:[id number] DOB: [birth year] DOA: 01/31/2024 DOS: the patient was seen and examined on 01/31/2024 PCP: System, Provider Not In  Patient coming from: Home  Chief Complaint:  Chief Complaint  Patient presents with   Chest Pain   HPI: Christopher Orr is a 78 y.o. male with medical history significant of COPD, CAD s/p PCI in 2006, HTN, and HLD. He presented to [facility] ED complaining of chest pain that was left sided and midsternal. He states that he has been short of breath with it, but he denies diaphoresis or nausea. It was not exertional. EKG did not demonstrate any new ischemic changes, but his troponin was elevated at 4107.   Cardiology was contacted by the EDP. The patient is on a heparin  gtt. He will be NPO after midnight. Plan is for [facility] tomorrow.   Review of Systems: As mentioned in the history of present illness. All other systems reviewed and are negative. Past Medical History:  Diagnosis Date   Abnormal findings on diagnostic imaging of digestive system    Acute kidney injury (HCC) 07/28/2014   Alcohol  abuse    pt states it is marijuana   Anemia    Arthritis    Arthritis 08/15/2019   Aspiration pneumonia (HCC) 07/28/2014   Bilateral inguinal hernia, without obstruction or gangrene, recurrent    Bipolar disorder (HCC)    BPH (benign prostatic hypertrophy) with urinary obstruction    CAD (coronary artery disease)    a. 2006 PCI/DES to LAD, EF 60%.   Chest pain 07/28/2014   Chronic heart failure with preserved ejection fraction (HFpEF) (HCC)    a. 07/2014 Echo: EF 55%; b. 11/2020 Echo: EF 50-55%, mod LVH, GrI DD, mildly enlarged RV w/ nl fxn, mild BAE.   Chronic pancreatitis (HCC)    Chronic viral hepatitis C (HCC) 09/24/2014   Overview:   GT 1a.   started on Harvoni 11/03/2014 for planned 12 week course, elastography shows cirrhosis.   Cigarette smoker 08/05/2018   Cirrhosis (HCC)    Cirrhosis of liver (HCC) 08/15/2019   Noted  in 2016 imaging with alcohol  abuse and h/o hep C tx s/p harvoni       COPD (chronic obstructive pulmonary disease) (HCC)    Coronary artery disease involving native heart with angina pectoris (HCC) 05/31/2018   COVID-19    08/22/20   Dementia (HCC)    early dementia   Depression    Depression, recurrent (HCC) 06/14/2018   Dilated ascending aorta and aortic root (HCC)    a. 11/2020 Echo: Ao root 44mm, Asc Ao 40mm. Ao arch 37mm.   Diverticulitis    12/06/20 [facility]    Diverticulosis    Drug use    Dyspnea    ED (erectile dysfunction)    Elevated troponin I level 07/28/2014   Gastritis    GERD (gastroesophageal reflux disease)    Headache    occasional migraines   Hepatitis C    treated   Hernia, inguinal, bilateral 08/15/2019   Recurrent s/p repair       History of lumbar fusion    History of pancreatitis 08/15/2019   X 2    History of prostate cancer 08/15/2019   2017 s/p brachytherapy with seeds   F/u Dr. Penne       Hyperlipidemia    Hypertension    patient denies having high blood pressure   Internal hemorrhoids 01/29/2021   Leg edema 01/03/2022  Lung cancer (HCC)    Malignant neoplasm of lower lobe of left lung (HCC) 05/15/2020   Memory loss 08/15/2019   Nodule of lower lobe of left lung 11/09/2019   Nodule of middle lobe of right lung 08/15/2019   Other chronic pain 01/29/2021   Pancreatitis    x 2    Pneumonia    07/06/18    Polyp of transverse colon    Poor nutrition 06/09/2022   Premature atrial contractions    a. 04/2021 Zio: Freq PACs w/ 8% burden.   Prostate cancer [facility])    with seeds   PSVT (paroxysmal supraventricular tachycardia) (HCC)    a. 04/2021 Zio: Avg HR 80 (60-210). 42 SVT runs - longest 5 beats @ 116, fastest 210 x 5 beats. Freq PACs (8%).   PTSD (post-traumatic stress disorder)    Rib fracture    s/p fall 03/15/19    Right ear pain 07/18/2021   S/P lumbar fusion 12/27/2020   Sinus bradycardia 01/29/2021   Sinus disease    SOB (shortness  of breath) on exertion 05/15/2020   Squamous carcinoma of lung, left (HCC) 11/26/2019   Syncope 01/29/2021   Syncope due to orthostatic hypotension 01/23/2021   Umbilical hernia without obstruction and without gangrene    Urge incontinence    Viral upper respiratory tract infection 07/18/2021   Past Surgical History:  Procedure Laterality Date   CARDIAC CATHETERIZATION  2006   cateract  2013   COLONOSCOPY WITH PROPOFOL  N/A 10/04/2019   Procedure: COLONOSCOPY WITH PROPOFOL ;  Surgeon: Jinny Carmine, MD;  Location: [facility] ENDOSCOPY;  Service: Endoscopy;  Laterality: N/A;   CORONARY ANGIOPLASTY  2006   CORONARY STENT PLACEMENT  2006   x 1   CYSTOSCOPY W/ RETROGRADES N/A 08/08/2021   Procedure: CYSTOSCOPY WITH RETROGRADE PYELOGRAM;  Surgeon: Penne Knee, MD;  Location: [facility] ORS;  Service: Urology;  Laterality: N/A;   CYSTOSCOPY WITH URETHRAL DILATATION N/A 08/08/2021   Procedure: CYSTOSCOPY WITH URETHRAL DILATATION WITH UROLUME BALLOON;  Surgeon: Penne Knee, MD;  Location: [facility] ORS;  Service: Urology;  Laterality: N/A;   ESOPHAGOGASTRODUODENOSCOPY N/A 12/22/2014   Procedure: ESOPHAGOGASTRODUODENOSCOPY (EGD);  Surgeon: Donnice Vaughn Manes, MD;  Location: [facility] ENDOSCOPY;  Service: Endoscopy;  Laterality: N/A;   ESOPHAGOGASTRODUODENOSCOPY (EGD) WITH PROPOFOL  N/A 10/04/2019   Procedure: ESOPHAGOGASTRODUODENOSCOPY (EGD) WITH PROPOFOL ;  Surgeon: Jinny Carmine, MD;  Location: [facility] ENDOSCOPY;  Service: Endoscopy;  Laterality: N/A;   HERNIA REPAIR  2015   left groin   INTERCOSTAL NERVE BLOCK Left 11/09/2019   Procedure: Intercostal Nerve Block;  Surgeon: Kerrin Elspeth BROCKS, MD;  Location: [facility] OR;  Service: Thoracic;  Laterality: Left;   LUMBAR FUSION     RADIOACTIVE SEED IMPLANT N/A 04/22/2016   Procedure: RADIOACTIVE SEED IMPLANT/BRACHYTHERAPY IMPLANT;  Surgeon: Knee Penne, MD;  Location: [facility] ORS;  Service: Urology;  Laterality: N/A;   ROBOT ASSISTED INGUINAL HERNIA REPAIR Bilateral 07/06/2018    Procedure: ROBOT ASSISTED INGUINAL HERNIA REPAIR;  Surgeon: Jordis Laneta FALCON, MD;  Location: [facility] ORS;  Service: General;  Laterality: Bilateral;   TONSILLECTOMY     UMBILICAL HERNIA REPAIR N/A 07/06/2018   Procedure: LAPAROSCOPIC ROBOT ASSISTED UMBILICAL HERNIA;  Surgeon: Jordis Laneta FALCON, MD;  Location: [facility] ORS;  Service: General;  Laterality: N/A;   XI ROBOTIC ASSISTED THORACOSCOPY- SEGMENTECTOMY Left 11/09/2019   Procedure: XI ROBOTIC ASSISTED THORACOSCOPY-WEDGE RESECTION LEFT LOWER LOBE;  Surgeon: Kerrin Elspeth BROCKS, MD;  Location: [facility] OR;  Service: Thoracic;  Laterality: Left;   Social History:  reports that  he has been smoking cigarettes. He started smoking about 61 years ago. He has a 92.3 pack-year smoking history. He has never used smokeless tobacco. He reports current alcohol  use of about 1.0 standard drink of alcohol  per week. He reports current drug use. Frequency: 7.00 times per week. Drug: Marijuana.  No Known Allergies  Family History  Problem Relation Age of Onset   Heart disease Father        Unclear details. Father passed in late 53's 2/2 cancer.   Colon cancer Father    Alcohol  abuse Sister    Drug abuse Sister    Anxiety disorder Sister    Depression Sister    COPD Brother    Obesity Brother    Kidney disease Neg Hx    Prostate cancer Neg Hx     Prior to Admission medications   Medication Sig Start Date End Date Taking? Authorizing Provider  ARIPiprazole  (ABILIFY ) 10 MG tablet TAKE 1 TABLET BY MOUTH EVERY NIGHT AT BEDTIME 07/28/23  Yes Hope Merle, MD  celecoxib  (CELEBREX ) 100 MG capsule Take 1 capsule (100 mg total) by mouth daily. 09/22/23  Yes Hope Merle, MD  clopidogrel  (PLAVIX ) 75 MG tablet TAKE 1 TABLET BY MOUTH DAILY 01/07/24  Yes Hope Merle, MD  donepezil  (ARICEPT ) 5 MG tablet Take 1 tablet (5 mg total) by mouth at bedtime. 05/12/23  Yes Hope Merle, MD  famotidine  (PEPCID ) 20 MG tablet TAKE 1 TABLET DAILY 01/07/24  Yes Hope Merle, MD  FLUoxetine   (PROZAC ) 10 MG capsule TAKE 3 CAPSULES BY MOUTH DAILY Patient taking differently: Take 10 mg by mouth daily. Take 10 mg by mouth along with one 20 mg capsule for total 30 mg once daily 01/07/24  Yes Hope Merle, MD  FLUoxetine  (PROZAC ) 20 MG capsule TAKE 1 CAPSULE BY MOUTH DAILY Patient taking differently: Take 20 mg by mouth daily. Take 20 mg capsule by mouth along with one 10 mg capsule for total 30 mg once daily 05/10/23  Yes Hope Merle, MD  memantine  (NAMENDA ) 10 MG tablet Take 1 tablet (10 mg total) by mouth 2 (two) times daily. 07/28/23  Yes Hope Merle, MD  mirabegron  ER (MYRBETRIQ ) 50 MG TB24 tablet Take 1 tablet (50 mg total) by mouth daily. 07/28/23  Yes Hope Merle, MD  pantoprazole  (PROTONIX ) 40 MG tablet TAKE 1 TABLET BY MOUTH EVERY IN THE MORNING 30 MINUTES BEFORE BREAKFAST 12/14/23  Yes Hope Merle, MD  rosuvastatin  (CRESTOR ) 10 MG tablet TAKE 1 TABLET BY MOUTH AT BEDTIME 12/16/23  Yes Hope Merle, MD  sucralfate  (CARAFATE ) 1 g tablet TAKE 1 TABLET BY MOUTH FOUR TIMES A DAY 12/14/23  Yes Hope Merle, MD  tamsulosin  (FLOMAX ) 0.4 MG CAPS capsule TAKE 1 CAPSULE DAILY 06/24/23  Yes Hope Merle, MD  traZODone  (DESYREL ) 100 MG tablet TAKE 1 TABLET BY MOUTH AT BEDTIME AS NEEDED FOR SLEEP 12/14/23  Yes Hope Merle, MD  metoprolol  succinate (TOPROL -XL) 50 MG 24 hr tablet Take 1 tablet (50 mg total) by mouth daily. PT. MUST MAKE AN APPOINTMENT IN ORDER TO RECEIVE FUTURE REFILLS. FINAL ATTEMPT. Patient not taking: Reported on 01/31/2024 12/29/23   Darron Deatrice LABOR, MD  Multiple Vitamins-Minerals (MULTIVITAMIN WITH MINERALS) tablet Take 1 tablet by mouth daily. Patient not taking: Reported on 06/16/2023    [provider]  pantoprazole  (PROTONIX ) 40 MG tablet TAKE 1 TABLET EVERY MORNING 30 MINUTES BEFORE BREAKFAST 01/07/24   Hope Merle, MD  sodium chloride  (OCEAN) 0.65 % SOLN nasal spray Place 2 sprays into both  nostrils as needed for congestion. Patient not taking: Reported on  06/16/2023 09/18/21   McLean-Scocuzza, Randine SAILOR, MD  sucralfate  (CARAFATE ) 1 g tablet Take 1 tablet (1 g total) by mouth 4 (four) times daily. 12/11/23   Hope Merle, MD  traZODone  (DESYREL ) 100 MG tablet Take 1 tablet (100 mg total) by mouth at bedtime as needed for sleep. 12/11/23   Hope Merle, MD    Physical Exam: Vitals:   01/31/24 1700 01/31/24 1730 01/31/24 1800 01/31/24 1830  BP: (!) 87/67 93/71 95/68  99/69  Pulse: 89 90 92 90  Resp: 20 14 17 18   Temp:      TempSrc:      SpO2: 99% 98% 100% 99%  Weight:      Height: 6' (1.829 m)      Exam:  Constitutional:  The patient is awake, alert, and oriented x 3. No acute distress. Eyes:  pupils and irises appear normal Normal lids and conjunctivae ENMT:  grossly normal hearing  Lips appear normal external ears, nose appear normal Oropharynx: mucosa, tongue,posterior pharynx appear normal Neck:  neck appears normal, no masses, normal ROM, supple no thyromegaly Respiratory:  No increased work of breathing. No wheezes, rales, or rhonchi No tactile fremitus Cardiovascular:  Regular rate and rhythm No murmurs, ectopy, or gallups. No lateral PMI. No thrills. Abdomen:  Abdomen is soft, non-tender, non-distended No hernias, masses, or organomegaly Normoactive bowel sounds.  Musculoskeletal:  No cyanosis, clubbing, or edema Skin:  No rashes, lesions, ulcers palpation of skin: no induration or nodules Neurologic:  CN 2-12 intact Sensation all 4 extremities intact Psychiatric:  Mental status Mood, affect appropriate Orientation to person, place, time  judgment and insight appear intact  Data Reviewed:  EKG CBC CMP Troponin  Assessment and Plan: NSTEMI (non-ST elevated myocardial infarction) [facility]) Patient presented with chest pain that was left sided and substernal. He denied nausea or diaphoresis, but admitted to shortness of breath. EKG demonstrated only old inferior and anterio-inferior infarcts with old IVCD and  LAD. Troponin was 4107. Cardiology was called by ED physician. The patient will be admitted and kept NPO after midnight. The plan is for [facility] tomorrow. The patient has known CAD and underwent PCI with stent placement in 2006. The patient is on plavix  at home.  COPD (chronic obstructive pulmonary disease) (HCC) Noted and stable.  Hyperlipidemia Continue crestor  10 mg daily as prior to admission. Lipid panel to be drawn in the am.  Dementia without behavioral disturbance (HCC) Continue namenda  and aricept  as prior to admission.  I have seen and examined this patient myself. I have spent 56 minutes in his evaluation and admission.   Advance Care Planning:   Code Status: Full Code   Consults: Cardiology  Family Communication: Son is at bedside  Severity of Illness: The appropriate patient status for this patient is INPATIENT. Inpatient status is judged to be reasonable and necessary in order to provide the required intensity of service to ensure the patient's safety. The patient's presenting symptoms, physical exam findings, and initial radiographic and laboratory data in the context of their chronic comorbidities is felt to place them at high risk for further clinical deterioration. Furthermore, it is not anticipated that the patient will be medically stable for discharge from the hospital within 2 midnights of admission.   * I certify that at the point of admission it is my clinical judgment that the patient will require inpatient hospital care spanning beyond 2 midnights from the point of admission due to  high intensity of service, high risk for further deterioration and high frequency of surveillance required.*  Author: Omar Gayden, DO 01/31/2024 7:48 PM  For on call review [email or web address].

## 2024-01-31 NOTE — Assessment & Plan Note (Signed)
Noted and stable.

## 2024-01-31 NOTE — ED Notes (Signed)
 Charge RN made aware of pt acuity.

## 2024-01-31 NOTE — ED Triage Notes (Addendum)
 Pt comes with cp that started this morning. Pt states left sided pain. Pt states and some mid sternal. Pt states sob.   Pt on thinners an hx of stent.

## 2024-02-01 ENCOUNTER — Other Ambulatory Visit: Payer: Self-pay

## 2024-02-01 ENCOUNTER — Encounter: Payer: Self-pay | Admitting: Cardiology

## 2024-02-01 ENCOUNTER — Encounter
Admission: EM | Disposition: A | Discharge status: Home Health | Payer: Self-pay | Source: Home / Self Care | Attending: Obstetrics and Gynecology

## 2024-02-01 DIAGNOSIS — I214 Non-ST elevation (NSTEMI) myocardial infarction: Secondary | ICD-10-CM | POA: Diagnosis not present

## 2024-02-01 DIAGNOSIS — E782 Mixed hyperlipidemia: Secondary | ICD-10-CM | POA: Diagnosis not present

## 2024-02-01 DIAGNOSIS — I959 Hypotension, unspecified: Secondary | ICD-10-CM

## 2024-02-01 HISTORY — PX: RIGHT/LEFT HEART CATH AND CORONARY ANGIOGRAPHY: CATH118266

## 2024-02-01 LAB — POCT I-STAT EG7
Acid-base deficit: 3 mmol/L — ABNORMAL HIGH (ref 0.0–2.0)
Acid-base deficit: 3 mmol/L — ABNORMAL HIGH (ref 0.0–2.0)
Acid-base deficit: 6 mmol/L — ABNORMAL HIGH (ref 0.0–2.0)
Bicarbonate: 22.6 mmol/L (ref 20.0–28.0)
Bicarbonate: 22.6 mmol/L (ref 20.0–28.0)
Bicarbonate: 20.2 mmol/L (ref 20.0–28.0)
Calcium, Ion: 1.18 mmol/L (ref 1.15–1.40)
Calcium, Ion: 1.2 mmol/L (ref 1.15–1.40)
Calcium, Ion: 1.14 mmol/L — ABNORMAL LOW (ref 1.15–1.40)
HCT: 32 % — ABNORMAL LOW (ref 39.0–52.0)
HCT: 32 % — ABNORMAL LOW (ref 39.0–52.0)
HCT: 33 % — ABNORMAL LOW (ref 39.0–52.0)
Hemoglobin: 10.9 g/dL — ABNORMAL LOW (ref 13.0–17.0)
Hemoglobin: 10.9 g/dL — ABNORMAL LOW (ref 13.0–17.0)
Hemoglobin: 11.2 g/dL — ABNORMAL LOW (ref 13.0–17.0)
O2 Saturation: 55 %
O2 Saturation: 58 %
O2 Saturation: 53 %
Potassium: 3.7 mmol/L (ref 3.5–5.1)
Potassium: 3.8 mmol/L (ref 3.5–5.1)
Potassium: 3.5 mmol/L (ref 3.5–5.1)
Sodium: 137 mmol/L (ref 135–145)
Sodium: 137 mmol/L (ref 135–145)
Sodium: 127 mmol/L — ABNORMAL LOW (ref 135–145)
TCO2: 24 mmol/L (ref 22–32)
TCO2: 24 mmol/L (ref 22–32)
TCO2: 21 mmol/L — ABNORMAL LOW (ref 22–32)
pCO2, Ven: 40.3 mmHg — ABNORMAL LOW (ref 44–60)
pCO2, Ven: 41.3 mmHg — ABNORMAL LOW (ref 44–60)
pCO2, Ven: 43.2 mmHg — ABNORMAL LOW (ref 44–60)
pH, Ven: 7.347 (ref 7.25–7.43)
pH, Ven: 7.358 (ref 7.25–7.43)
pH, Ven: 7.277 (ref 7.25–7.43)
pO2, Ven: 31 mmHg — CL (ref 32–45)
pO2, Ven: 31 mmHg — CL (ref 32–45)
pO2, Ven: 32 mmHg (ref 32–45)

## 2024-02-01 LAB — RESPIRATORY PANEL BY PCR

## 2024-02-01 LAB — C-REACTIVE PROTEIN: CRP: 12 mg/dL — ABNORMAL HIGH (ref <1.0)

## 2024-02-01 LAB — POCT I-STAT 7, (LYTES, BLD GAS, ICA,H+H)
Acid-base deficit: 3 mmol/L — ABNORMAL HIGH (ref 0.0–2.0)
Bicarbonate: 21 mmol/L (ref 20.0–28.0)
Calcium, Ion: 1.2 mmol/L (ref 1.15–1.40)
HCT: 32 % — ABNORMAL LOW (ref 39.0–52.0)
Hemoglobin: 10.9 g/dL — ABNORMAL LOW (ref 13.0–17.0)
O2 Saturation: 91 %
Potassium: 3.8 mmol/L (ref 3.5–5.1)
Sodium: 137 mmol/L (ref 135–145)
TCO2: 22 mmol/L (ref 22–32)
pCO2 arterial: 34.4 mmHg (ref 32–48)
pH, Arterial: 7.392 (ref 7.35–7.45)
pO2, Arterial: 60 mmHg — ABNORMAL LOW (ref 83–108)

## 2024-02-01 LAB — CBC
HCT: 34.6 % — ABNORMAL LOW (ref 39.0–52.0)
Hemoglobin: 11.6 g/dL — ABNORMAL LOW (ref 13.0–17.0)
MCH: 31.5 pg (ref 26.0–34.0)
MCHC: 33.5 g/dL (ref 30.0–36.0)
MCV: 94 fL (ref 80.0–100.0)
Platelets: 349 K/uL (ref 150–400)
RBC: 3.68 MIL/uL — ABNORMAL LOW (ref 4.22–5.81)
RDW: 14.1 % (ref 11.5–15.5)
WBC: 15.2 K/uL — ABNORMAL HIGH (ref 4.0–10.5)
nRBC: 0 % (ref 0.0–0.2)

## 2024-02-01 LAB — BASIC METABOLIC PANEL WITH GFR
Anion gap: 9 (ref 5–15)
BUN: 16 mg/dL (ref 8–23)
CO2: 23 mmol/L (ref 22–32)
Calcium: 8.6 mg/dL — ABNORMAL LOW (ref 8.9–10.3)
Chloride: 104 mmol/L (ref 98–111)
Creatinine, Ser: 1.21 mg/dL (ref 0.61–1.24)
GFR, Estimated: >60 mL/min (ref >60)
Glucose, Bld: 134 mg/dL — ABNORMAL HIGH (ref 70–99)
Potassium: 4.1 mmol/L (ref 3.5–5.1)
Sodium: 136 mmol/L (ref 135–145)

## 2024-02-01 LAB — LIPID PANEL
Cholesterol: 78 mg/dL (ref 0–200)
HDL: 37 mg/dL — ABNORMAL LOW (ref >40)
LDL Cholesterol: 32 mg/dL (ref 0–99)
Total CHOL/HDL Ratio: 2.1 ratio
Triglycerides: 43 mg/dL (ref <150)
VLDL: 9 mg/dL (ref 0–40)

## 2024-02-01 LAB — POCT ACTIVATED CLOTTING TIME: Activated Clotting Time: 153 s

## 2024-02-01 LAB — HEPARIN LEVEL (UNFRACTIONATED)
Heparin Unfractionated: 0.22 [IU]/mL — ABNORMAL LOW (ref 0.30–0.70)
Heparin Unfractionated: <0.1 [IU]/mL — ABNORMAL LOW (ref 0.30–0.70)

## 2024-02-01 LAB — SEDIMENTATION RATE: Sed Rate: 32 mm/h — ABNORMAL HIGH (ref 0–16)

## 2024-02-01 LAB — SARS CORONAVIRUS 2 BY RT PCR: SARS Coronavirus 2 by RT PCR: NEGATIVE

## 2024-02-01 LAB — CG4 I-STAT (LACTIC ACID): Lactic Acid, Venous: 0.9 mmol/L (ref 0.5–1.9)

## 2024-02-01 SURGERY — RIGHT/LEFT HEART CATH AND CORONARY ANGIOGRAPHY
Anesthesia: Moderate Sedation

## 2024-02-01 MED ORDER — LIDOCAINE HCL 1 % IJ SOLN
INTRAMUSCULAR | Status: AC
  Filled 2024-02-01: qty 20

## 2024-02-01 MED ORDER — MORPHINE SULFATE (PF) 4 MG/ML IV SOLN
INTRAVENOUS | Status: DC | PRN
  Administered 2024-02-01: 2 mg via INTRAVENOUS

## 2024-02-01 MED ORDER — FOLIC ACID 1 MG PO TABS
1.0000 mg | ORAL_TABLET | Freq: Every day | ORAL | Status: DC
  Administered 2024-02-02 – 2024-02-04 (×3): 1 mg via ORAL
  Filled 2024-02-01 (×3): qty 1

## 2024-02-01 MED ORDER — MIDAZOLAM HCL 2 MG/2ML IJ SOLN
INTRAMUSCULAR | Status: DC | PRN
  Administered 2024-02-01: 1 mg via INTRAVENOUS

## 2024-02-01 MED ORDER — ENSURE PLUS HIGH PROTEIN PO LIQD: 237.0000 mL | Freq: Two times a day (BID) | ORAL | Status: DC

## 2024-02-01 MED ORDER — ADULT MULTIVITAMIN W/MINERALS CH
1.0000 | ORAL_TABLET | Freq: Every day | ORAL | Status: DC
  Administered 2024-02-02 – 2024-02-04 (×3): 1 via ORAL
  Filled 2024-02-01 (×3): qty 1

## 2024-02-01 MED ORDER — SODIUM CHLORIDE 0.9% FLUSH
3.0000 mL | Freq: Two times a day (BID) | INTRAVENOUS | Status: DC
  Administered 2024-02-01 – 2024-02-04 (×6): 3 mL via INTRAVENOUS

## 2024-02-01 MED ORDER — LABETALOL HCL 5 MG/ML IV SOLN: 10.0000 mg | INTRAVENOUS | Status: AC | PRN

## 2024-02-01 MED ORDER — TAMSULOSIN HCL 0.4 MG PO CAPS
0.4000 mg | ORAL_CAPSULE | Freq: Every day | ORAL | Status: DC
  Administered 2024-02-01 – 2024-02-04 (×4): 0.4 mg via ORAL
  Filled 2024-02-01 (×4): qty 1

## 2024-02-01 MED ORDER — SODIUM CHLORIDE 0.9 % IV SOLN: INTRAVENOUS | Status: AC

## 2024-02-01 MED ORDER — SODIUM CHLORIDE 0.9 % IV BOLUS
INTRAVENOUS | Status: AC | PRN
  Administered 2024-02-01 (×2): 250 mL via INTRAVENOUS

## 2024-02-01 MED ORDER — FENTANYL CITRATE (PF) 100 MCG/2ML IJ SOLN
INTRAMUSCULAR | Status: DC | PRN
  Administered 2024-02-01: 25 ug via INTRAVENOUS

## 2024-02-01 MED ORDER — VERAPAMIL HCL 2.5 MG/ML IV SOLN
INTRAVENOUS | Status: DC | PRN
  Administered 2024-02-01: 2.5 mg via INTRAVENOUS

## 2024-02-01 MED ORDER — IBUPROFEN 400 MG PO TABS
800.0000 mg | ORAL_TABLET | Freq: Three times a day (TID) | ORAL | Status: DC
  Administered 2024-02-01 – 2024-02-02 (×6): 800 mg via ORAL
  Filled 2024-02-01 (×6): qty 2

## 2024-02-01 MED ORDER — LIDOCAINE HCL (PF) 1 % IJ SOLN
INTRAMUSCULAR | Status: DC | PRN
  Administered 2024-02-01 (×2): 2 mL

## 2024-02-01 MED ORDER — IOHEXOL 300 MG/ML  SOLN
INTRAMUSCULAR | Status: DC | PRN
  Administered 2024-02-01: 55 mL

## 2024-02-01 MED ORDER — HYDRALAZINE HCL 20 MG/ML IJ SOLN: 10.0000 mg | INTRAMUSCULAR | Status: AC | PRN

## 2024-02-01 MED ORDER — MORPHINE SULFATE (PF) 4 MG/ML IV SOLN
INTRAVENOUS | Status: AC
Start: 2024-02-01 — End: 2024-02-01
  Filled 2024-02-01: qty 1

## 2024-02-01 MED ORDER — FENTANYL CITRATE (PF) 100 MCG/2ML IJ SOLN
INTRAMUSCULAR | Status: AC
  Filled 2024-02-01: qty 2

## 2024-02-01 MED ORDER — SODIUM CHLORIDE 0.9 % IV SOLN: 250.0000 mL | INTRAVENOUS | Status: AC | PRN

## 2024-02-01 MED ORDER — HEPARIN SODIUM (PORCINE) 1000 UNIT/ML IJ SOLN
INTRAMUSCULAR | Status: DC | PRN
  Administered 2024-02-01: 5000 [IU] via INTRAVENOUS

## 2024-02-01 MED ORDER — HEPARIN (PORCINE) IN NACL 1000-0.9 UT/500ML-% IV SOLN
INTRAVENOUS | Status: AC
Start: 2024-02-01 — End: 2024-02-01
  Filled 2024-02-01: qty 1000

## 2024-02-01 MED ORDER — HEPARIN (PORCINE) IN NACL 2000-0.9 UNIT/L-% IV SOLN
INTRAVENOUS | Status: DC | PRN
  Administered 2024-02-01: 1000 mL

## 2024-02-01 MED ORDER — SODIUM CHLORIDE 0.9% FLUSH: 3.0000 mL | INTRAVENOUS | Status: DC | PRN

## 2024-02-01 MED ORDER — COLCHICINE 0.6 MG PO TABS
0.6000 mg | ORAL_TABLET | Freq: Two times a day (BID) | ORAL | Status: DC
  Administered 2024-02-01 – 2024-02-04 (×7): 0.6 mg via ORAL
  Filled 2024-02-01 (×8): qty 1

## 2024-02-01 MED ORDER — HEPARIN SODIUM (PORCINE) 1000 UNIT/ML IJ SOLN
INTRAMUSCULAR | Status: AC
  Filled 2024-02-01: qty 10

## 2024-02-01 MED ORDER — THIAMINE HCL 100 MG PO TABS
100.0000 mg | ORAL_TABLET | Freq: Every day | ORAL | Status: DC
  Administered 2024-02-02 – 2024-02-04 (×3): 100 mg via ORAL
  Filled 2024-02-01 (×6): qty 1

## 2024-02-01 MED ORDER — MIDAZOLAM HCL 2 MG/2ML IJ SOLN
INTRAMUSCULAR | Status: AC
  Filled 2024-02-01: qty 2

## 2024-02-01 MED ORDER — VERAPAMIL HCL 2.5 MG/ML IV SOLN
INTRAVENOUS | Status: AC
  Filled 2024-02-01: qty 2

## 2024-02-01 MED ORDER — ENSURE PLUS HIGH PROTEIN PO LIQD
237.0000 mL | Freq: Three times a day (TID) | ORAL | Status: DC
  Administered 2024-02-01 – 2024-02-04 (×4): 237 mL via ORAL

## 2024-02-01 MED ORDER — HEPARIN SODIUM (PORCINE) 5000 UNIT/ML IJ SOLN
5000.0000 [IU] | Freq: Three times a day (TID) | INTRAMUSCULAR | Status: DC
  Administered 2024-02-01 – 2024-02-02 (×2): 5000 [IU] via SUBCUTANEOUS
  Filled 2024-02-01 (×2): qty 1

## 2024-02-01 MED ORDER — HEPARIN BOLUS VIA INFUSION
1500.0000 [IU] | Freq: Once | INTRAVENOUS | Status: AC
  Administered 2024-02-01: 1500 [IU] via INTRAVENOUS
  Filled 2024-02-01: qty 1500

## 2024-02-01 SURGICAL SUPPLY — 15 items
CATH BALLN WEDGE 5F 110CM (CATHETERS) IMPLANT
CATH INFINITI 5FR JL4 (CATHETERS) IMPLANT
CATH INFINITI JR4 5F (CATHETERS) IMPLANT
DEVICE RAD COMP TR BAND LRG (VASCULAR PRODUCTS) IMPLANT
DRAPE BRACHIAL (DRAPES) IMPLANT
GLIDESHEATH SLEND SS 6F .021 (SHEATH) IMPLANT
GUIDEWIRE EMER 3M J .025X150CM (WIRE) IMPLANT
GUIDEWIRE INQWIRE 1.5J.035X260 (WIRE) IMPLANT
KIT ENCORE 26 ADVANTAGE (KITS) IMPLANT
KIT MICROPUNCTURE NIT STIFF (SHEATH) IMPLANT
PACK CARDIAC CATH (CUSTOM PROCEDURE TRAY) ×1 IMPLANT
SET ATX-X65L (MISCELLANEOUS) IMPLANT
SHEATH GLIDE SLENDER 4/5FR (SHEATH) IMPLANT
STATION PROTECTION PRESSURIZED (MISCELLANEOUS) IMPLANT
WIRE HITORQ VERSACORE ST 145CM (WIRE) IMPLANT

## 2024-02-01 NOTE — Evaluation (Signed)
 Physical Therapy Evaluation Patient Details Name: MAKAIO MACH MRN: [id number] DOB: 09/21/1945 Today's Date: 02/01/2024  History of Present Illness  Patient is a 78 year old male with chest pain, initial presentation consistent with non-STEMI. S/p urgent catheterization. History of COPD, CAD s/p PCI in 2006, HTN, HLD.  Clinical Impression  Patient is agreeable to PT evaluation. He reports he is independent with mobility prior to admission.  Today the patient was able to stand and walk a short distance with CGA for safety. Activity tolerance limited by fatigue but he reports feeling better with mobility. Recommend to continue PT to maximize independence. Patient is hopeful to return home at discharge.       If plan is discharge home, recommend the following: Assistance with cooking/housework;Assist for transportation   Can travel by private vehicle        Equipment Recommendations None recommended by PT  Recommendations for Other Services       Functional Status Assessment Patient has had a recent decline in their functional status and demonstrates the ability to make significant improvements in function in a reasonable and predictable amount of time.     Precautions / Restrictions Precautions Precautions: Fall Recall of Precautions/Restrictions: Intact Restrictions Weight Bearing Restrictions Per Provider Order: No      Mobility  Bed Mobility Overal bed mobility: Needs Assistance Bed Mobility: Supine to Sit     Supine to sit: Contact guard          Transfers Overall transfer level: Needs assistance Equipment used: None Transfers: Sit to/from Stand Sit to Stand: Contact guard assist           General transfer comment: 2 standing bouts performed    Ambulation/Gait Ambulation/Gait assistance: Contact guard assist Gait Distance (Feet): 20 Feet Assistive device: None Gait Pattern/deviations: Step-through pattern, Wide base of support Gait velocity:  decreased     General Gait Details: slow but steady with ambulation. activity tolerance limited by fatigue  Stairs            Wheelchair Mobility     Tilt Bed    Modified Rankin (Stroke Patients Only)       Balance Overall balance assessment: Needs assistance Sitting-balance support: Feet supported Sitting balance-Leahy Scale: Fair     Standing balance support: No upper extremity supported Standing balance-Leahy Scale: Fair                               Pertinent Vitals/Pain Pain Assessment Pain Assessment: 0-10 Pain Score: 7  Pain Location: lower chest Pain Descriptors / Indicators: Discomfort Pain Intervention(s): Monitored during session, Limited activity within patient's tolerance, Repositioned    Home Living Family/patient expects to be discharged to:: Private residence Living Arrangements: Non-relatives/Friends Available Help at Discharge: Family;Available PRN/intermittently Type of Home: Mobile home Home Access: Stairs to enter Entrance Stairs-Rails: Left Entrance Stairs-Number of Steps: 4   Home Layout: One level Home Equipment: Cane - single point      Prior Function Prior Level of Function : Independent/Modified Independent             Mobility Comments: independent       Extremity/Trunk Assessment   Upper Extremity Assessment Upper Extremity Assessment: Generalized weakness    Lower Extremity Assessment Lower Extremity Assessment: Generalized weakness       Communication   Communication Communication: No apparent difficulties    Cognition Arousal: Alert Behavior During Therapy: WFL for tasks assessed/performed  PT - Cognitive impairments: No apparent impairments                         Following commands: Intact       Cueing Cueing Techniques: Verbal cues     General Comments General comments (skin integrity, edema, etc.): Sp02 in the upper 80's- low 90's with activity on room air.     Exercises     Assessment/Plan    PT Assessment Patient needs continued PT services  PT Problem List Decreased strength;Decreased activity tolerance;Decreased balance;Decreased mobility;Decreased safety awareness       PT Treatment Interventions DME instruction;Gait training;Stair training;Functional mobility training;Therapeutic activities;Therapeutic exercise;Balance training;Cognitive remediation;Patient/family education    PT Goals (Current goals can be found in the Care Plan section)  Acute Rehab PT Goals Patient Stated Goal: to go home PT Goal Formulation: With patient Time For Goal Achievement: 02/15/24 Potential to Achieve Goals: Good    Frequency Min 2X/week     Co-evaluation PT/OT/SLP Co-Evaluation/Treatment: Yes Reason for Co-Treatment: Complexity of the patient's impairments (multi-system involvement) PT goals addressed during session: Mobility/safety with mobility         AM-PAC PT 6 Clicks Mobility  Outcome Measure Help needed turning from your back to your side while in a flat bed without using bedrails?: A Little Help needed moving from lying on your back to sitting on the side of a flat bed without using bedrails?: A Little Help needed moving to and from a bed to a chair (including a wheelchair)?: A Little Help needed standing up from a chair using your arms (e.g., wheelchair or bedside chair)?: A Little Help needed to walk in hospital room?: A Little Help needed climbing 3-5 steps with a railing? : A Little 6 Click Score: 18    End of Session   Activity Tolerance: Patient tolerated treatment well Patient left: in chair;with call bell/phone within reach;with family/visitor present Nurse Communication: Mobility status PT Visit Diagnosis: Muscle weakness (generalized) (M62.81);Unsteadiness on feet (R26.81)    Time: 8994-8965 PT Time Calculation (min) (ACUTE ONLY): 29 min   Charges:   PT Evaluation $PT Eval Moderate Complexity: 1 Mod   PT  General Charges $$ ACUTE PT VISIT: 1 Visit         Randine Essex, PT, MPT   Randine LULLA Essex 02/01/2024, 1:32 PM

## 2024-02-01 NOTE — Progress Notes (Signed)
   02/01/24 1600  Spiritual Encounters  Type of Visit Follow up  Care provided to: Pt and family (Daughter at bedside)  Referral source Chaplain assessment  Reason for visit Advance directives  OnCall Visit No  Spiritual Framework  Presenting Themes Goals in life/care  Spiritual Care Plan  Spiritual Care Issues Still Outstanding Chaplain will continue to follow (Will try to get AD notary in the AM)

## 2024-02-01 NOTE — Progress Notes (Signed)
   02/01/24 1355  Spiritual Encounters  Type of Visit Initial  Care provided to: Pt and family (Daughter at bedside)  Referral source Nurse (RN/NT/LPN)  Reason for visit Advance directives  OnCall Visit No  Spiritual Framework  Presenting Themes Goals in life/care  Interventions  Spiritual Care Interventions Made Established relationship of care and support;Compassionate presence;Reflective listening;Decision-making support/facilitation  Intervention Outcomes  Outcomes Autonomy/agency;Awareness of health;Awareness of support  Advance Directives (For Healthcare)  Does Patient Have a Medical Advance Directive? No  Would patient like information on creating a medical advance directive? Yes (Inpatient - patient defers creating a medical advance directive at this time - Information given) (Pt would like time to discuss the AD w/his daughter and fill out the information)

## 2024-02-01 NOTE — Evaluation (Signed)
 Occupational Therapy Evaluation Patient Details Name: Christopher Orr MRN: [id number] DOB: January 29, 1946 Today's Date: 02/01/2024   History of Present Illness   Patient is a 78 year old male with chest pain, initial presentation consistent with non-STEMI. S/p urgent cardiac catheterization. History of COPD, CAD s/p PCI in 2006, HTN, HLD.    Clinical Impressions Christopher Orr was seen for OT evaluation this date. Prior to hospital admission, pt was IND. Pt lives with friends/family. Pt presents to acute OT demonstrating impaired ADL performance and functional mobility 2/2 decreased activity tolerance and functional strength deficits. Pt currently requires CGA for ADL t/f ~20 ft. SETUP seated grooming task. SUPERVISION don B socks in sitting. Educated on HEP, d/c recs, DME recs, and OT role. Pt would benefit from skilled OT to address noted impairments and functional limitations (see below for any additional details). Upon hospital discharge, recommend OT follow up.     If plan is discharge home, recommend the following:   A little help with walking and/or transfers;A little help with bathing/dressing/bathroom     Functional Status Assessment   Patient has had a recent decline in their functional status and demonstrates the ability to make significant improvements in function in a reasonable and predictable amount of time.     Equipment Recommendations   BSC/3in1     Recommendations for Other Services         Precautions/Restrictions   Precautions Precautions: Fall Recall of Precautions/Restrictions: Intact Restrictions Weight Bearing Restrictions Per Provider Order: No     Mobility Bed Mobility Overal bed mobility: Needs Assistance Bed Mobility: Supine to Sit     Supine to sit: Supervision          Transfers Overall transfer level: Needs assistance Equipment used: None Transfers: Sit to/from Stand Sit to Stand: Contact guard assist                   Balance Overall balance assessment: Needs assistance Sitting-balance support: Feet supported Sitting balance-Leahy Scale: Fair     Standing balance support: No upper extremity supported Standing balance-Leahy Scale: Fair                             ADL either performed or assessed with clinical judgement   ADL Overall ADL's : Needs assistance/impaired                                       General ADL Comments: CGA for ADL t/f ~20 ft. SETUP seated grooming task. SUPERVISION don B socks in sitting.    Pertinent Vitals/Pain Pain Assessment Pain Assessment: 0-10 Pain Score: 7  Pain Location: lower chest Pain Descriptors / Indicators: Discomfort Pain Intervention(s): Limited activity within patient's tolerance, Repositioned     Extremity/Trunk Assessment Upper Extremity Assessment Upper Extremity Assessment: Generalized weakness   Lower Extremity Assessment Lower Extremity Assessment: Generalized weakness       Communication Communication Communication: No apparent difficulties   Cognition Arousal: Alert Behavior During Therapy: WFL for tasks assessed/performed Cognition: No apparent impairments                               Following commands: Intact       Cueing  General Comments   Cueing Techniques: Verbal cues  SpO2 upper 87-93% on RA with activity  Exercises     Shoulder Instructions      Home Living Family/patient expects to be discharged to:: Private residence Living Arrangements: Non-relatives/Friends;Children Available Help at Discharge: Family;Available 24 hours/day Type of Home: Mobile home Home Access: Stairs to enter Entrance Stairs-Number of Steps: 4 Entrance Stairs-Rails: Left Home Layout: One level     Bathroom Shower/Tub: Tub/shower unit         Home Equipment: Cane - single point          Prior Functioning/Environment Prior Level of Function : Independent/Modified Independent              Mobility Comments: independent      OT Problem List: Decreased strength;Decreased range of motion;Decreased activity tolerance;Impaired balance (sitting and/or standing)   OT Treatment/Interventions: Self-care/ADL training;Therapeutic exercise;DME and/or AE instruction;Energy conservation;Therapeutic activities;Balance training      OT Goals(Current goals can be found in the care plan section)   Acute Rehab OT Goals Patient Stated Goal: to go home OT Goal Formulation: With patient Time For Goal Achievement: 02/15/24 Potential to Achieve Goals: Good ADL Goals Pt Will Perform Grooming: with modified independence;standing Pt Will Perform Lower Body Dressing: with modified independence;sit to/from stand Pt Will Transfer to Toilet: with modified independence;ambulating;regular height toilet   OT Frequency:  Min 2X/week    Co-evaluation PT/OT/SLP Co-Evaluation/Treatment: Yes Reason for Co-Treatment: Complexity of the patient's impairments (multi-system involvement) PT goals addressed during session: Mobility/safety with mobility OT goals addressed during session: ADL's and self-care      AM-PAC OT 6 Clicks Daily Activity     Outcome Measure Help from another person eating meals?: None Help from another person taking care of personal grooming?: A Little Help from another person toileting, which includes using toliet, bedpan, or urinal?: A Little Help from another person bathing (including washing, rinsing, drying)?: A Little Help from another person to put on and taking off regular upper body clothing?: None Help from another person to put on and taking off regular lower body clothing?: A Little 6 Click Score: 20   End of Session Nurse Communication: Mobility status  Activity Tolerance: Patient tolerated treatment well Patient left: in chair;with call bell/phone within reach;with family/visitor present  OT Visit Diagnosis: Other abnormalities of gait and  mobility (R26.89);Muscle weakness (generalized) (M62.81)                Time: 8993-8960 OT Time Calculation (min): 33 min Charges:  OT General Charges $OT Visit: 1 Visit OT Evaluation $OT Eval Moderate Complexity: 1 Mod  Christopher Orr, M.S. OTR/L  02/01/24, 2:02 PM  ascom [phone]

## 2024-02-01 NOTE — TOC Initial Note (Signed)
 Transition of Care [facility]) - Initial/Assessment Note    Patient Details  Name: Christopher Orr MRN: [id number] Date of Birth: 02-09-1946  Transition of Care [facility]) CM/SW Contact:    Christopher Orr A Christopher Brunetti, RN Phone Number: 02/01/2024, 2:55 PM  Clinical Narrative:      Chart reviewed.  Noted that patient was admitted with NSTEMI.  Noted that Cardiology has been consulted to see patient.  Possible plan for Lt and Right heart cath.    I have meet with patient and his daughter Christopher Orr at bedside today.  Patient reports that prior to admission he lived in a mobile home with his son and the son's fiance.  Christopher Orr reports that prior to admission he was able to walk and get around without any assistive devices.  Christopher Orr also reports that he does have a wooden walking stick at home.  He reports that he is able to bath and dress himself.  He reports that he receives medications through mail from a company called [employer].  He reports that he receives medications that are needed immediately from [facility].  He reports that he has a 0 dollar co-pay for his medications.  Daughter reports that his son's fiance is arranging for patient to go to [facility] on discharge for PCP needs.    Christopher Orr informs me that his mobile home needs some repairs done to it.  He reports that he is not able to afford the repairs at this time.  He has requested resources on home repairs.  He also reports that his car is not running and requested transportation assistance. Christopher Orr his daughter informs me that the [employer] delivers him a meal every Thursday.  I have informed Christopher Orr that [facility] DSS may help with home repairs. Christopher Orr also informed me that his Septic tank is needs to cleaned out and he also needs assistance with the cost.  I have also informed him to reach out to DSS to re-instate his food stamps.  I will also provide additional resources for the above concerns.  Daughter reports that patient  receives and SS monthly.  However, she feels like her father's money is not lasting when she feels like he would pay his bills and still have money left over.  She thinks that her family is sub scripting to different insurance agencies for life insurance and they are talking out additional money from his account. Christopher Orr also reports that he has a friend that will buy him things with his debit card and she feels like the friend may be taking money.  She is planning on taking Christopher Orr to the bank to see what exactly is coming out of the account and to determine what is expenses are coming out of the account.    Patient did voice concerns about needing Ensure on discharge and would like to see if the insurance could cover the Ensure.  I have reached out to the dietitian.  Will have the provider write a prescription for Ensure on discharge.    TOC will continue to follow for discharge planning.                  Expected Discharge Plan: Home w Home Health Services Barriers to Discharge: Continued Medical Work up   Patient Goals and CMS Choice            Expected Discharge Plan and Services   Discharge Planning Services: CM Consult   Living arrangements  for the past 2 months: Mobile Home                   DME Agency:  (Has a wooden walking cane at home.)                  Prior Living Arrangements/Services Living arrangements for the past 2 months: Mobile Home Lives with:: Adult Children (Patient lives with his son and his finanee.) Patient language and need for interpreter reviewed:: Yes Do you feel safe going back to the place where you live?: Yes        Care giver support system in place?: Yes (comment) (Patient has a supportive daughter)      Activities of Daily Living   ADL Screening (condition at time of admission) Independently performs ADLs?: No Does the patient have a NEW difficulty with bathing/dressing/toileting/self-feeding that is expected to last >3 days?: Yes  (Initiates electronic notice to provider for possible OT consult) Does the patient have a NEW difficulty with getting in/out of bed, walking, or climbing stairs that is expected to last >3 days?: Yes (Initiates electronic notice to provider for possible PT consult) Is the patient deaf or have difficulty hearing?: No Does the patient have difficulty seeing, even when wearing glasses/contacts?: No Does the patient have difficulty concentrating, remembering, or making decisions?: Yes  Permission Sought/Granted                  Emotional Assessment Appearance:: Appears stated age Attitude/Demeanor/Rapport: Engaged Affect (typically observed): Appropriate Orientation: : Oriented to Self, Oriented to Place, Oriented to  Time, Oriented to Situation      Admission diagnosis:  NSTEMI (non-ST elevated myocardial infarction) (HCC) [I21.4] Patient Active Problem List   Diagnosis Date Noted   Hypotension 02/01/2024   NSTEMI (non-ST elevated myocardial infarction) (HCC) 01/31/2024   Dementia without behavioral disturbance (HCC) 01/31/2024   Need for influenza vaccination 06/06/2023   At risk for polypharmacy 06/06/2023   Chronic diastolic heart failure (HCC) 09/23/2022   Stage 3a chronic kidney disease (HCC) 08/24/2022   Polysubstance abuse (HCC) 08/24/2022   Weight loss 08/21/2022   Poor nutrition 06/09/2022   Atherosclerosis of aorta (HCC) 05/25/2022   Erectile dysfunction 05/14/2022   Bipolar disorder (HCC) 01/29/2021   Tetrahydrocannabinol (THC) use disorder, mild, abuse 01/29/2021   Chronic bilateral low back pain without sciatica 12/27/2020   Alcohol  abuse 11/01/2020   Late onset Alzheimer's disease without behavioral disturbance (HCC) 05/07/2020   Goals of care, counseling/discussion 11/26/2019   Lung cancer (HCC) 11/01/2019   Prediabetes 09/14/2019   Kidney lesion 08/15/2019   Insomnia 08/15/2019   Tobacco abuse 08/15/2019   CAD (coronary artery disease)    Degenerative  disc disease, lumbar 11/01/2018   Panic attacks 06/14/2018   Post traumatic stress disorder (PTSD) 06/14/2018   Osteoarthritis of multiple joints 05/31/2018   Hyperlipidemia 05/31/2018   COPD (chronic obstructive pulmonary disease) (HCC) 07/28/2014   Anxiety and depression 07/28/2014   GERD (gastroesophageal reflux disease) 07/28/2014   PCP:  System, Provider Not In Pharmacy:   [facility] - [place], [place] - [address] [place] [address] Phone: [phone] Fax: [phone]  [id number] [place], [place] - [address] [place] [address] Phone: [phone] Fax: [phone]  [id number] [place] [place] - [address] [place] [address] Phone: [phone] Fax: [phone]     Social Drivers of Health (SDOH) Social History: SDOH  Screenings   Food Insecurity: Food Insecurity Present (01/31/2024)  Housing: Low Risk  (06/17/2023)  Transportation Needs: No Transportation Needs (01/31/2024)  Utilities: At Risk (01/31/2024)  Depression (PHQ2-9): High Risk (06/02/2023)  Financial Resource Strain: Low Risk  (04/29/2022)  Physical Activity: Inactive (12/25/2017)  Social Connections: Socially Isolated (01/31/2024)  Stress: No Stress Concern Present (06/25/2022)  Tobacco Use: High Risk (01/31/2024)   SDOH Interventions:     Readmission Risk Interventions     No data to display

## 2024-02-01 NOTE — Progress Notes (Signed)
 Initial Nutrition Assessment  DOCUMENTATION CODES:   Non-severe (moderate) malnutrition in context of chronic illness  INTERVENTION:   Ensure Plus High Protein po TID, each supplement provides 350 kcal and 20 grams of protein  Magic cup TID with meals, each supplement provides 290 kcal and 9 grams of protein  MVI, folic acid  and thiamine  po daily   Liberalize diet   Pt at high refeed risk; recommend monitor potassium, magnesium  and phosphorus labs daily until stable  Daily weights   NUTRITION DIAGNOSIS:   Moderate Malnutrition related to chronic illness as evidenced by moderate fat depletion, moderate muscle depletion.  GOAL:   Patient will meet greater than or equal to 90% of their needs  MONITOR:   PO intake, Supplement acceptance, Labs, Weight trends, I & O's, Skin  REASON FOR ASSESSMENT:   Malnutrition Screening Tool    ASSESSMENT:   78 y/o male with h/o COPD, HLD, CAD s/p PCI (2006), lung cancer s/p wedge resection 2021, hepatitis, cirrhosis, etoh and drug abuse, bipolar disorder, PVD, CKD III, BPH, prostate cancer s/p seed inplantation (2017), PTSD, depression, anxiety, GERD, diverticulitis and umbilical/inguinal hernias s/p mesh repair (2019) who is admitted with NSTEMI, chest pain and pericardititis.  Met with pt and family in room today. History provided by both patient and family. Pt reports poor appetite and oral intake at baseline. Family reports that pt spends his whole day in the recliner. Pt reports that he does drink chocolate and vanilla Ensure but reports that he does not currently have any at home. Pt reports that he ate bites of his breakfast this morning but reports that the food tasted terrible without salt. RD discussed with pt the importance of adequate nutrition needed to preserve lean muscle. RD will add supplements and vitamins to help pt meet his estimated needs. RD will also liberalize pt's diet. Pt is at high refeed risk. Pt reports a 16lb weight  loss over the past 3 months. Per chart, pt is down 48lbs(18%) in < 1 year; this is significant weight loss.   Medications reviewed and include: aspirin , folic acid , heparin , melatonin, MVI, nicotine , protonix , thiamine   Labs reviewed: K 4.1 wnl Wbc- 15.2(H)  NUTRITION - FOCUSED PHYSICAL EXAM:  Flowsheet Row Most Recent Value  Orbital Region Moderate depletion  Upper Arm Region Moderate depletion  Thoracic and Lumbar Region Moderate depletion  Buccal Region Moderate depletion  Temple Region Moderate depletion  Clavicle Bone Region Severe depletion  Clavicle and Acromion Bone Region Severe depletion  Scapular Bone Region Moderate depletion  Dorsal Hand Moderate depletion  Patellar Region Mild depletion  Anterior Thigh Region No depletion  Posterior Calf Region No depletion  Edema (RD Assessment) Mild  Hair Reviewed  Eyes Reviewed  Mouth Reviewed  Skin Reviewed  Nails Reviewed   Diet Order:   Diet Order             Diet 2 gram sodium Room service appropriate? Yes; Fluid consistency: Thin  Diet effective now                  EDUCATION NEEDS:   Education needs have been addressed  Skin:  Skin Assessment: Reviewed RN Assessment  Last BM:  7/5  Height:   Ht Readings from Last 1 Encounters:  01/31/24 6' (1.829 m)    Weight:   Wt Readings from Last 1 Encounters:  01/31/24 96.3 kg    Ideal Body Weight:  80.9 kg  BMI:  Body mass index is 28.79 kg/m.  Estimated  Nutritional Needs:   Kcal:  2300-2600kcal/day  Protein:  120-135g/day  Fluid:  2.1-2.4L/day  Augustin Shams MS, RD, LDN If unable to be reached, please send secure chat to RD inpatient available from 8:00a-4:00p daily

## 2024-02-01 NOTE — Progress Notes (Signed)
 PHARMACY - ANTICOAGULATION CONSULT NOTE  Pharmacy Consult for heparin  Indication: chest pain/ACS  No Known Allergies  Patient Measurements: Height: 6' (182.9 cm) Weight: 96.3 kg (212 lb 4.9 oz) IBW/kg (Calculated) : 77.6 HEPARIN  DW (KG): 101.9  Vital Signs: Temp: 97.8 F (36.6 C) (07/06 2000) Temp Source: Oral (07/06 2000) BP: 88/59 (07/07 0100) Pulse Rate: 79 (07/07 0100)  Labs: Recent Labs    01/31/24 1434 01/31/24 1550 01/31/24 1726 02/01/24 0017  HGB 13.6  --   --   --   HCT 40.3  --   --   --   PLT 428*  --   --   --   LABPROT  --  14.2  --   --   INR  --  1.0  --   --   HEPARINUNFRC  --   --   --  0.22*  CREATININE 1.25*  --   --   --   TROPONINIHS 4,107*  --  3,623*  --     Estimated Creatinine Clearance: 59.6 mL/min (A) (by C-G formula based on SCr of 1.25 mg/dL (H)).   Medical History: Past Medical History:  Diagnosis Date   Abnormal findings on diagnostic imaging of digestive system    Acute kidney injury (HCC) 07/28/2014   Alcohol  abuse    pt states it is marijuana   Anemia    Arthritis    Arthritis 08/15/2019   Aspiration pneumonia (HCC) 07/28/2014   Bilateral inguinal hernia, without obstruction or gangrene, recurrent    Bipolar disorder (HCC)    BPH (benign prostatic hypertrophy) with urinary obstruction    CAD (coronary artery disease)    a. 2006 PCI/DES to LAD, EF 60%.   Chest pain 07/28/2014   Chronic heart failure with preserved ejection fraction (HFpEF) (HCC)    a. 07/2014 Echo: EF 55%; b. 11/2020 Echo: EF 50-55%, mod LVH, GrI DD, mildly enlarged RV w/ nl fxn, mild BAE.   Chronic pancreatitis (HCC)    Chronic viral hepatitis C (HCC) 09/24/2014   Overview:   GT 1a.   started on Harvoni 11/03/2014 for planned 12 week course, elastography shows cirrhosis.   Cigarette smoker 08/05/2018   Cirrhosis (HCC)    Cirrhosis of liver (HCC) 08/15/2019   Noted in 2016 imaging with alcohol  abuse and h/o hep C tx s/p harvoni       COPD (chronic  obstructive pulmonary disease) (HCC)    Coronary artery disease involving native heart with angina pectoris (HCC) 05/31/2018   COVID-19    08/22/20   Dementia (HCC)    early dementia   Depression    Depression, recurrent (HCC) 06/14/2018   Dilated ascending aorta and aortic root (HCC)    a. 11/2020 Echo: Ao root 44mm, Asc Ao 40mm. Ao arch 37mm.   Diverticulitis    12/06/20 [facility]    Diverticulosis    Drug use    Dyspnea    ED (erectile dysfunction)    Elevated troponin I level 07/28/2014   Gastritis    GERD (gastroesophageal reflux disease)    Headache    occasional migraines   Hepatitis C    treated   Hernia, inguinal, bilateral 08/15/2019   Recurrent s/p repair       History of lumbar fusion    History of pancreatitis 08/15/2019   X 2    History of prostate cancer 08/15/2019   2017 s/p brachytherapy with seeds   F/u Dr. Penne       Hyperlipidemia  Hypertension    patient denies having high blood pressure   Internal hemorrhoids 01/29/2021   Leg edema 01/03/2022   Lung cancer (HCC)    Malignant neoplasm of lower lobe of left lung (HCC) 05/15/2020   Memory loss 08/15/2019   Nodule of lower lobe of left lung 11/09/2019   Nodule of middle lobe of right lung 08/15/2019   Other chronic pain 01/29/2021   Pancreatitis    x 2    Pneumonia    07/06/18    Polyp of transverse colon    Poor nutrition 06/09/2022   Premature atrial contractions    a. 04/2021 [facility]: Freq PACs w/ 8% burden.   Prostate cancer [facility])    with seeds   PSVT (paroxysmal supraventricular tachycardia) (HCC)    a. 04/2021 Zio: Avg HR 80 (60-210). 42 SVT runs - longest 5 beats @ 116, fastest 210 x 5 beats. Freq PACs (8%).   PTSD (post-traumatic stress disorder)    Rib fracture    s/p fall 03/15/19    Right ear pain 07/18/2021   S/P lumbar fusion 12/27/2020   Sinus bradycardia 01/29/2021   Sinus disease    SOB (shortness of breath) on exertion 05/15/2020   Squamous carcinoma of lung, left (HCC)  11/26/2019   Syncope 01/29/2021   Syncope due to orthostatic hypotension 01/23/2021   Umbilical hernia without obstruction and without gangrene    Urge incontinence    Viral upper respiratory tract infection 07/18/2021    Medications:  Scheduled:   ARIPiprazole   10 mg Oral QHS   aspirin  EC  81 mg Oral Daily   Chlorhexidine  Gluconate Cloth  6 each Topical Daily   donepezil   5 mg Oral QHS   feeding supplement  237 mL Oral BID BM   FLUoxetine   30 mg Oral Daily   heparin   1,500 Units Intravenous Once   melatonin  2.5 mg Oral QHS   memantine   10 mg Oral BID   mirabegron  ER  50 mg Oral Daily   nicotine   21 mg Transdermal Daily   pantoprazole   40 mg Oral Daily   rosuvastatin   10 mg Oral QHS   Assessment: 78 y/o male presenting with chest pain - found to have elevated troponin levels in ED. PMH significant for COPD, CAD s/p PCI in 2006, HTN, HLD. Pharmacy has been consulted to initiate heparin  infusion. Per chart review, patient not on anticoagulation prior to admission.  Baseline labs: hgb 13.6, plt 428, INR pending  Goal of Therapy:  Heparin  level 0.3-0.7 units/ml Monitor platelets by anticoagulation protocol: Yes   Plan:  7/7:  HL @ 0017 = 0.22, SUBtherapeutic  - will order heparin  1500 units IV X 1 bolus and increase drip rate to 1550 units/hr - will recheck HL 8 hrs after rate change - CBC daily   Thank you for involving pharmacy in this patient's care.   Derin Granquist D Clinical Pharmacist 02/01/2024 1:27 AM

## 2024-02-01 NOTE — Progress Notes (Signed)
 PROGRESS NOTE    Christopher Orr  FMW:[id number] DOB: 29-Mar-1946 DOA: 01/31/2024 PCP: System, Provider Not In     Brief Narrative:   From admission h and p   Christopher Orr is a 78 y.o. male with medical history significant of COPD, CAD s/p PCI in 2006, HTN, and HLD. He presented to [facility] ED complaining of chest pain that was left sided and midsternal. He states that he has been short of breath with it, but he denies diaphoresis or nausea. It was not exertional. EKG did not demonstrate any new ischemic changes, but his troponin was elevated at 4107.    Cardiology was contacted by the EDP. The patient is on a heparin  gtt. He will be NPO after midnight. Plan is for [facility] tomorrow.  Assessment & Plan:   Principal Problem:   NSTEMI (non-ST elevated myocardial infarction) (HCC) Active Problems:   COPD (chronic obstructive pulmonary disease) (HCC)   Hyperlipidemia   CAD (coronary artery disease)   Tobacco abuse   Lung cancer (HCC)   Alcohol  abuse   Bipolar disorder (HCC)   Stage 3a chronic kidney disease (HCC)   Dementia without behavioral disturbance (HCC)   Hypotension  # Chest pain # Pericarditis # CAD Several days constant chest pain worse with exertion, trop elevated to 4000s. History DES to LAD in 2006. Started on IV heparin , taken urgently to cath which showed non-obstructive CAD, no culprit lesion. Cardiology thinks this is pericarditis. Low filling pressures, does not appear to have chf exacerbation - tte ordered - colchicine  and ibuprofen  per cardiology, will continue ppi - maintain tele - hold plavix  per cardiology until completed course ibuprofen  - cont home statin - f/u respiratory panel  # Bipolar - cont home abilify , fluoxetine , trazodone   # COPD Doesn't appear exacerbated. O2 is hovering in the low 90s  # History lung cancer Nothing acute seen on CXR. Prior wedge resection - outpt onc f/u  # Dementia No behavioral disturbance - cont home donepezil ,  memantine   # OAB, BPH - home mirabegron , flomax  - bladder scan today   DVT prophylaxis: lovenox  Code Status: full Family Communication: daughter updated telephonically 7/7  Level of care: Stepdown Status is: Inpatient Remains inpatient appropriate because: severity of illness    Consultants:  cardiology  Procedures: LHC  Antimicrobials:  none    Subjective: Reports mild chest pain  Objective: Vitals:   02/01/24 0600 02/01/24 0700 02/01/24 0800 02/01/24 0900  BP: 115/71 108/65 110/66 105/69  Pulse: 89 89 94 93  Resp: 11 13 16 13   Temp:   97.7 F (36.5 C)   TempSrc:   Axillary   SpO2: 97% 95% 92% 94%  Weight:      Height:        Intake/Output Summary (Last 24 hours) at 02/01/2024 1031 Last data filed at 02/01/2024 0941 Gross per 24 hour  Intake 2039.09 ml  Output 250 ml  Net 1789.09 ml   Filed Weights   01/31/24 1432 01/31/24 1652  Weight: 113.4 kg 96.3 kg    Examination:  General exam: Appears calm and comfortable  Respiratory system: Clear to auscultation. Respiratory effort normal. Cardiovascular system: S1 & S2 heard, RRR. Distant heart sounds Gastrointestinal system: Abdomen is nondistended, soft and nontender.   Central nervous system: Alert and oriented. No focal neurological deficits. Extremities: Symmetric 5 x 5 power. No lower extremity edema Skin: No rashes, lesions or ulcers Psychiatry: Judgement and insight appear normal. Mood & affect appropriate.     Data  Reviewed: I have personally reviewed following labs and imaging studies  CBC: Recent Labs  Lab 01/31/24 1434 02/01/24 0249 02/01/24 0255 02/01/24 0300 02/01/24 0414  WBC 16.7*  --   --   --  15.2*  HGB 13.6 10.9* 11.2*  10.9* 10.9* 11.6*  HCT 40.3 32.0* 33.0*  32.0* 32.0* 34.6*  MCV 93.9  --   --   --  94.0  PLT 428*  --   --   --  349   Basic Metabolic Panel: Recent Labs  Lab 01/31/24 1434 02/01/24 0249 02/01/24 0255 02/01/24 0300 02/01/24 0414  NA 138 137 127*   137 137 136  K 4.2 3.8 3.5  3.7 3.8 4.1  CL 103  --   --   --  104  CO2 26  --   --   --  23  GLUCOSE 136*  --   --   --  134*  BUN 13  --   --   --  16  CREATININE 1.25*  --   --   --  1.21  CALCIUM  9.4  --   --   --  8.6*   GFR: Estimated Creatinine Clearance: 61.5 mL/min (by C-G formula based on SCr of 1.21 mg/dL). Liver Function Tests: No results for input(s): AST, ALT, ALKPHOS, BILITOT, PROT, ALBUMIN in the last 168 hours. No results for input(s): LIPASE, AMYLASE in the last 168 hours. No results for input(s): AMMONIA in the last 168 hours. Coagulation Profile: Recent Labs  Lab 01/31/24 1550  INR 1.0   Cardiac Enzymes: No results for input(s): CKTOTAL, CKMB, CKMBINDEX, TROPONINI in the last 168 hours. BNP (last 3 results) No results for input(s): PROBNP in the last 8760 hours. HbA1C: No results for input(s): HGBA1C in the last 72 hours. CBG: Recent Labs  Lab 01/31/24 1650  GLUCAP 113*   Lipid Profile: Recent Labs    02/01/24 0414  CHOL 78  HDL 37*  LDLCALC 32  TRIG 43  CHOLHDL 2.1   Thyroid  Function Tests: No results for input(s): TSH, T4TOTAL, FREET4, T3FREE, THYROIDAB in the last 72 hours. Anemia Panel: No results for input(s): VITAMINB12, FOLATE, FERRITIN, TIBC, IRON, RETICCTPCT in the last 72 hours. Urine analysis:    Component Value Date/Time   COLORURINE YELLOW (A) 02/19/2023 1850   APPEARANCEUR CLEAR (A) 02/19/2023 1850   APPEARANCEUR Clear 07/15/2022 1448   LABSPEC 1.011 02/19/2023 1850   PHURINE 5.0 02/19/2023 1850   GLUCOSEU NEGATIVE 02/19/2023 1850   HGBUR NEGATIVE 02/19/2023 1850   BILIRUBINUR NEGATIVE 02/19/2023 1850   BILIRUBINUR Negative 07/15/2022 1448   KETONESUR NEGATIVE 02/19/2023 1850   PROTEINUR NEGATIVE 02/19/2023 1850   UROBILINOGEN 0.2 07/27/2014 0236   NITRITE NEGATIVE 02/19/2023 1850   LEUKOCYTESUR NEGATIVE 02/19/2023 1850   Sepsis  Labs: @LABRCNTIP (procalcitonin:4,lacticidven:4)  ) Recent Results (from the past 240 hours)  MRSA Next Gen by PCR, Nasal     Status: None   Collection Time: 01/31/24  7:22 PM   Specimen: Nasal Mucosa; Nasal Swab  Result Value Ref Range Status   MRSA by PCR Next Gen NOT DETECTED NOT DETECTED Final    Comment: (NOTE) The GeneXpert MRSA Assay (FDA approved for NASAL specimens only), is one component of a comprehensive MRSA colonization surveillance program. It is not intended to diagnose MRSA infection nor to guide or monitor treatment for MRSA infections. Test performance is not FDA approved in patients less than 54 years old. Performed at [facility], [address] [place],  [place] [address]          Radiology Studies: CARDIAC CATHETERIZATION Result Date: 02/01/2024 Images from the original result were not included. Dominance: Right Angiographically normal coronary arteries with minimal proximal LAD disease and widely patent mid LAD stent. Suspect presentation is not consistent with non-STEMI given lack of culprit lesion-more consistent with PERICARDITIS Relatively Normal Right Heart Cath Numbers with mean PAP of 19 mmHg and PCWP of 12 mmHg along with LVEDP of 18 mmHg. Hypotensive on arrival with pressures in the high 80s that dropped into the 70s with verapamil .  With 250 mL x 2 IVF bolus, blood pressures upon completion of procedure were 100-105/50 mmHg by radial art line. => Lactate was 0.8-not consistent with shock. RECOMMENDATIONS I have started colchicine  0.6 mg twice daily and ibuprofen  800 mg 3 times daily Will hydrate gently for the next several hours-patient had relatively low filling pressures, improved with bolus -- Could probably transfer to telemetry later on in the morning Check 2D echo in the morning If pressures stable Patient had been on Plavix  per med list prior to arrival, but with plan for high-dose ibuprofen , would hold off on restarting Plavix  until  completed ibuprofen  course. IV heparin  discontinued Christopher Clay, MD  DG Chest 2 View Result Date: 01/31/2024 CLINICAL DATA:  Chest pain.  Shortness of breath. EXAM: CHEST - 2 VIEW COMPARISON:  Chest radiographs 10/29/2020, 02/14/2020; CT chest, abdomen, and pelvis 01/16/2023 FINDINGS: Cardiac silhouette and mediastinal contours are within normal limits. Mild calcification within aortic arch. No significant change in curvilinear left-greater-than-right chronic scarring. Mild blunting of the left costophrenic angle is improved from most recent 10/29/2020 radiograph. Mild blunting of the posterior costophrenic angle on lateral view is also improved from prior. There is again flattening of the diaphragms and moderate hyperinflation. No acute airspace opacity is seen. No pneumothorax. Moderate multilevel degenerative disc changes of the thoracic spine. IMPRESSION: 1. No acute cardiopulmonary process. 2. Moderate hyperinflation and prominent left basilar chronic scarring. 3. Mild blunting of the left costophrenic angle is improved from most recent 10/29/2020 radiograph. This appears to represent resolution of the prior small pleural effusion, likely with mild chronic residual scarring. Electronically Signed   By: Tanda Lyons M.D.   On: 01/31/2024 15:02        Scheduled Meds:  ARIPiprazole   10 mg Oral QHS   aspirin  EC  81 mg Oral Daily   Chlorhexidine  Gluconate Cloth  6 each Topical Daily   colchicine   0.6 mg Oral BID   donepezil   5 mg Oral QHS   feeding supplement  237 mL Oral BID BM   FLUoxetine   30 mg Oral Daily   heparin   5,000 Units Subcutaneous Q8H   ibuprofen   800 mg Oral TID   melatonin  2.5 mg Oral QHS   memantine   10 mg Oral BID   mirabegron  ER  50 mg Oral Daily   nicotine   21 mg Transdermal Daily   pantoprazole   40 mg Oral Daily   rosuvastatin   10 mg Oral QHS   sodium chloride  flush  3 mL Intravenous Q12H   Continuous Infusions:  sodium chloride  150 mL/hr at 02/01/24 0130    sodium chloride        LOS: 1 day     Christopher KATHEE Ban, MD [facility]   If 7PM-7AM, please contact night-coverage [email or web address] Password [email or web address] 02/01/2024, 10:31 AM

## 2024-02-01 NOTE — Progress Notes (Signed)
 Spoke to the on-call cardiologist Shelda Bruckner, MD, about the patient's persistent hypotension and chest pain that is now not responding effectively to PRN morphine  (chest pain remains an 8/10). The decision was made to take the patient to the cath lab tonight and the team has been activated. Patient has been informed of this plan and is agreeable.  Patient's daughter called to update but did not answer, left a message for her to give the ICU a call back.   Arlean FORBES Bowers, RN

## 2024-02-01 NOTE — Progress Notes (Signed)
  Progress Note  Patient Name: Christopher Orr Date of Encounter: 02/01/2024 [facility] Cardiologist: Deatrice Cage, MD   Interval Summary     Cath showed no significant CAD, felt to be pericarditis. BP improved. Patient is overall feeling better with minimal chest pain. Cath site is stable.   Vital Signs Vitals:   02/01/24 1100 02/01/24 1158 02/01/24 1200 02/01/24 1300  BP: 122/73 127/66 127/66   Pulse: 100 97 95 (!) 108  Resp: 16 19 13 19   Temp:      TempSrc:      SpO2: 92% 93% 94% 98%  Weight:      Height:        Intake/Output Summary (Last 24 hours) at 02/01/2024 1445 Last data filed at 02/01/2024 1300 Gross per 24 hour  Intake 2039.09 ml  Output 400 ml  Net 1639.09 ml      01/31/2024    4:52 PM 01/31/2024    2:32 PM 06/02/2023    1:04 PM  Last 3 Weights  Weight (lbs) 212 lb 4.9 oz 250 lb 233 lb 4 oz  Weight (kg) 96.3 kg 113.399 kg 105.802 kg      Telemetry/ECG  STHR 70-80s - Personally Reviewed  Physical Exam  GEN: No acute distress.   Neck: No JVD Cardiac: RRR, no murmurs, rubs, or gallops.  Respiratory: Clear to auscultation bilaterally. GI: Soft, nontender, non-distended  MS: No edema  Patient Profile: Christopher Orr is a 78 y.o. male with a hx of single-vessel CAD with LAD PCI back in 2006-patent by catheterization in 2011 with chronic HFpEF, along with COPD, HTN HLD who is being seen 02/01/2024 for the evaluation of non-STEMI with ongoing chest pain and borderline hypotension  Assessment & Plan   NSTEMI - HS troponin peaked over 4000 - due to ongoing chest pain and hypotension patient was taken for urgent cardiac cath.  - right and left cardiac cath showed normal coronaries with minimal pLAD disease and widely patent mLAD stent.it was felt presentation consistent with pericarditis - IV heparin  stopped - ASA 81mg  daily, Crestor  10mg  daily  Acute pericarditis - started on colchicine  0.6mg BID and ibuprofen  800mg  TID x 3 months - echo  ordered  Hypotension - given IV bolus in the cath lab and IVF until late morning with improvement of pressures  COPD - per IM  HLD - LDL 32 - continue Crestor  10mg  daily     For questions or updates, please contact [facility] Please consult [email or web address] for contact info under       Signed, Theresea Trautmann VEAR Fishman, PA-C

## 2024-02-01 NOTE — Consult Note (Signed)
 Cardiology Consultation   Patient ID: Christopher Orr MRN: [id number]; DOB: [birth year]  Admit date: 01/31/2024 Date of Consult: 02/01/2024  PCP:  System, Provider Not In   [facility] Providers Cardiologist:  Deatrice Cage, MD  Electrophysiologist:  OLE ONEIDA HOLTS, MD       Patient Profile: Christopher Orr is a 78 y.o. male with a hx of single-vessel CAD with LAD PCI back in 2006-patent by catheterization in 2011 with chronic HFpEF, along with COPD, HTN HLD who is being seen 02/01/2024 for the evaluation of non-STEMI with ongoing chest pain and borderline hypotension at the request of Dr. Soledad.Christopher Orr  History of Present Illness: Christopher Orr presented to [facility] ER at roughly 2:30 to 3 PM 01/31/2024 with left-sided chest pain and shortness of breath as well as diaphoresis and nausea.  Symptoms are nonexertional.He ruled in for non-STEMI with troponins of 4000 that are on the way down trending to 3600.  Original plan was to admit and treat with IV heparin  and plan for cardiac catheterization in the morning of 02/01/2024 however patient has had ongoing chest discomfort with persistent borderline hypotension systolic pressures in the high 80s to low 90s although mentating well.  They have not been able to give nitroglycerin  and has been able to give minimal amounts of morphine  with no significant improvement of pain.  Dr. Lonni (on-call cardiologist was contacted) who felt that with ongoing chest pain and ongoing chest pain that we should consider urgent cardiac catheterization especially in light of his borderline hypotension and difficult to treat symptoms..  On my evaluation he is having 9-10/10 chest pain.  It does seem to be somewhat positional worse with lying down, worse with deep inspiration.  He denies any orthopnea or edema.  No irregular heartbeats or palpitations.  No lightheadedness or dizziness.  Mentating well despite having blood pressures in the high 80s to low 90s.   Past  Medical History:  Diagnosis Date   Abnormal findings on diagnostic imaging of digestive system    Acute kidney injury (HCC) 07/28/2014   Alcohol  abuse    pt states it is marijuana   Anemia    Arthritis    Arthritis 08/15/2019   Aspiration pneumonia (HCC) 07/28/2014   Bilateral inguinal hernia, without obstruction or gangrene, recurrent    Bipolar disorder (HCC)    BPH (benign prostatic hypertrophy) with urinary obstruction    CAD (coronary artery disease)    a. 2006 PCI/DES to LAD, EF 60%.   Chest pain 07/28/2014   Chronic heart failure with preserved ejection fraction (HFpEF) (HCC)    a. 07/2014 Echo: EF 55%; b. 11/2020 Echo: EF 50-55%, mod LVH, GrI DD, mildly enlarged RV w/ nl fxn, mild BAE.   Chronic pancreatitis (HCC)    Chronic viral hepatitis C (HCC) 09/24/2014   Overview:   GT 1a.   started on Harvoni 11/03/2014 for planned 12 week course, elastography shows cirrhosis.   Cigarette smoker 08/05/2018   Cirrhosis (HCC)    Cirrhosis of liver (HCC) 08/15/2019   Noted in 2016 imaging with alcohol  abuse and h/o hep C tx s/p harvoni       COPD (chronic obstructive pulmonary disease) (HCC)    Coronary artery disease involving native heart with angina pectoris (HCC) 05/31/2018   COVID-19    08/22/20   Dementia (HCC)    early dementia   Depression    Depression, recurrent (HCC) 06/14/2018   Dilated ascending aorta and aortic root (HCC)    a.  11/2020 Echo: Ao root 44mm, Asc Ao 40mm. Ao arch 37mm.   Diverticulitis    12/06/20 [facility]    Diverticulosis    Drug use    Dyspnea    ED (erectile dysfunction)    Elevated troponin I level 07/28/2014   Gastritis    GERD (gastroesophageal reflux disease)    Headache    occasional migraines   Hepatitis C    treated   Hernia, inguinal, bilateral 08/15/2019   Recurrent s/p repair       History of lumbar fusion    History of pancreatitis 08/15/2019   X 2    History of prostate cancer 08/15/2019   2017 s/p brachytherapy with seeds   F/u Dr.  Penne       Hyperlipidemia    Hypertension    patient denies having high blood pressure   Internal hemorrhoids 01/29/2021   Leg edema 01/03/2022   Lung cancer (HCC)    Malignant neoplasm of lower lobe of left lung (HCC) 05/15/2020   Memory loss 08/15/2019   Nodule of lower lobe of left lung 11/09/2019   Nodule of middle lobe of right lung 08/15/2019   Other chronic pain 01/29/2021   Pancreatitis    x 2    Pneumonia    07/06/18    Polyp of transverse colon    Poor nutrition 06/09/2022   Premature atrial contractions    a. 04/2021 Zio: Freq PACs w/ 8% burden.   Prostate cancer [facility])    with seeds   PSVT (paroxysmal supraventricular tachycardia) (HCC)    a. 04/2021 Zio: Avg HR 80 (60-210). 42 SVT runs - longest 5 beats @ 116, fastest 210 x 5 beats. Freq PACs (8%).   PTSD (post-traumatic stress disorder)    Rib fracture    s/p fall 03/15/19    Right ear pain 07/18/2021   S/P lumbar fusion 12/27/2020   Sinus bradycardia 01/29/2021   Sinus disease    SOB (shortness of breath) on exertion 05/15/2020   Squamous carcinoma of lung, left (HCC) 11/26/2019   Syncope 01/29/2021   Syncope due to orthostatic hypotension 01/23/2021   Umbilical hernia without obstruction and without gangrene    Urge incontinence    Viral upper respiratory tract infection 07/18/2021    Past Surgical History:  Procedure Laterality Date   CARDIAC CATHETERIZATION  2006   cateract  2013   COLONOSCOPY WITH PROPOFOL  N/A 10/04/2019   Procedure: COLONOSCOPY WITH PROPOFOL ;  Surgeon: Jinny Carmine, MD;  Location: [facility] ENDOSCOPY;  Service: Endoscopy;  Laterality: N/A;   CORONARY ANGIOPLASTY  2006   CORONARY STENT PLACEMENT  2006   x 1   CYSTOSCOPY W/ RETROGRADES N/A 08/08/2021   Procedure: CYSTOSCOPY WITH RETROGRADE PYELOGRAM;  Surgeon: Penne Knee, MD;  Location: [facility] ORS;  Service: Urology;  Laterality: N/A;   CYSTOSCOPY WITH URETHRAL DILATATION N/A 08/08/2021   Procedure: CYSTOSCOPY WITH URETHRAL DILATATION  WITH UROLUME BALLOON;  Surgeon: Penne Knee, MD;  Location: [facility] ORS;  Service: Urology;  Laterality: N/A;   ESOPHAGOGASTRODUODENOSCOPY N/A 12/22/2014   Procedure: ESOPHAGOGASTRODUODENOSCOPY (EGD);  Surgeon: Donnice Vaughn Manes, MD;  Location: [facility] ENDOSCOPY;  Service: Endoscopy;  Laterality: N/A;   ESOPHAGOGASTRODUODENOSCOPY (EGD) WITH PROPOFOL  N/A 10/04/2019   Procedure: ESOPHAGOGASTRODUODENOSCOPY (EGD) WITH PROPOFOL ;  Surgeon: Jinny Carmine, MD;  Location: [facility] ENDOSCOPY;  Service: Endoscopy;  Laterality: N/A;   HERNIA REPAIR  2015   left groin   INTERCOSTAL NERVE BLOCK Left 11/09/2019   Procedure: Intercostal Nerve Block;  Surgeon: Kerrin Standing  C, MD;  Location: [facility] OR;  Service: Thoracic;  Laterality: Left;   LUMBAR FUSION     RADIOACTIVE SEED IMPLANT N/A 04/22/2016   Procedure: RADIOACTIVE SEED IMPLANT/BRACHYTHERAPY IMPLANT;  Surgeon: Rosina Riis, MD;  Location: [facility] ORS;  Service: Urology;  Laterality: N/A;   ROBOT ASSISTED INGUINAL HERNIA REPAIR Bilateral 07/06/2018   Procedure: ROBOT ASSISTED INGUINAL HERNIA REPAIR;  Surgeon: Jordis Laneta FALCON, MD;  Location: [facility] ORS;  Service: General;  Laterality: Bilateral;   TONSILLECTOMY     UMBILICAL HERNIA REPAIR N/A 07/06/2018   Procedure: LAPAROSCOPIC ROBOT ASSISTED UMBILICAL HERNIA;  Surgeon: Jordis Laneta FALCON, MD;  Location: [facility] ORS;  Service: General;  Laterality: N/A;   XI ROBOTIC ASSISTED THORACOSCOPY- SEGMENTECTOMY Left 11/09/2019   Procedure: XI ROBOTIC ASSISTED THORACOSCOPY-WEDGE RESECTION LEFT LOWER LOBE;  Surgeon: Kerrin Elspeth BROCKS, MD;  Location: [facility] OR;  Service: Thoracic;  Laterality: Left;     Home Medications:  Prior to Admission medications   Medication Sig Start Date End Date Taking? Authorizing Provider  ARIPiprazole  (ABILIFY ) 10 MG tablet TAKE 1 TABLET BY MOUTH EVERY NIGHT AT BEDTIME 07/28/23  Yes Hope Merle, MD  celecoxib  (CELEBREX ) 100 MG capsule Take 1 capsule (100 mg total) by mouth daily. 09/22/23  Yes Hope Merle, MD  clopidogrel  (PLAVIX ) 75 MG tablet TAKE 1 TABLET BY MOUTH DAILY 01/07/24  Yes Hope Merle, MD  donepezil  (ARICEPT ) 5 MG tablet Take 1 tablet (5 mg total) by mouth at bedtime. 05/12/23  Yes Hope Merle, MD  famotidine  (PEPCID ) 20 MG tablet TAKE 1 TABLET DAILY 01/07/24  Yes Hope Merle, MD  FLUoxetine  (PROZAC ) 10 MG capsule TAKE 3 CAPSULES BY MOUTH DAILY Patient taking differently: Take 10 mg by mouth daily. Take 10 mg by mouth along with one 20 mg capsule for total 30 mg once daily 01/07/24  Yes Hope Merle, MD  FLUoxetine  (PROZAC ) 20 MG capsule TAKE 1 CAPSULE BY MOUTH DAILY Patient taking differently: Take 20 mg by mouth daily. Take 20 mg capsule by mouth along with one 10 mg capsule for total 30 mg once daily 05/10/23  Yes Hope Merle, MD  memantine  (NAMENDA ) 10 MG tablet Take 1 tablet (10 mg total) by mouth 2 (two) times daily. 07/28/23  Yes Hope Merle, MD  mirabegron  ER (MYRBETRIQ ) 50 MG TB24 tablet Take 1 tablet (50 mg total) by mouth daily. 07/28/23  Yes Hope Merle, MD  pantoprazole  (PROTONIX ) 40 MG tablet TAKE 1 TABLET BY MOUTH EVERY IN THE MORNING 30 MINUTES BEFORE BREAKFAST 12/14/23  Yes Hope Merle, MD  rosuvastatin  (CRESTOR ) 10 MG tablet TAKE 1 TABLET BY MOUTH AT BEDTIME 12/16/23  Yes Hope Merle, MD  sucralfate  (CARAFATE ) 1 g tablet TAKE 1 TABLET BY MOUTH FOUR TIMES A DAY 12/14/23  Yes Hope Merle, MD  tamsulosin  (FLOMAX ) 0.4 MG CAPS capsule TAKE 1 CAPSULE DAILY 06/24/23  Yes Hope Merle, MD  traZODone  (DESYREL ) 100 MG tablet TAKE 1 TABLET BY MOUTH AT BEDTIME AS NEEDED FOR SLEEP 12/14/23  Yes Hope Merle, MD  metoprolol  succinate (TOPROL -XL) 50 MG 24 hr tablet Take 1 tablet (50 mg total) by mouth daily. PT. MUST MAKE AN APPOINTMENT IN ORDER TO RECEIVE FUTURE REFILLS. FINAL ATTEMPT. Patient not taking: Reported on 01/31/2024 12/29/23   Darron Deatrice LABOR, MD  Multiple Vitamins-Minerals (MULTIVITAMIN WITH MINERALS) tablet Take 1 tablet by mouth daily. Patient not taking:  Reported on 06/16/2023    [provider]  pantoprazole  (PROTONIX ) 40 MG tablet TAKE 1 TABLET EVERY MORNING 30 MINUTES BEFORE BREAKFAST 01/07/24  Hope Merle, MD  sodium chloride  (OCEAN) 0.65 % SOLN nasal spray Place 2 sprays into both nostrils as needed for congestion. Patient not taking: Reported on 06/16/2023 09/18/21   McLean-Scocuzza, Randine SAILOR, MD  sucralfate  (CARAFATE ) 1 g tablet Take 1 tablet (1 g total) by mouth 4 (four) times daily. 12/11/23   Hope Merle, MD  traZODone  (DESYREL ) 100 MG tablet Take 1 tablet (100 mg total) by mouth at bedtime as needed for sleep. 12/11/23   Hope Merle, MD    Scheduled Meds:  ILDA Hold] ARIPiprazole   10 mg Oral QHS   [MAR Hold] aspirin  EC  81 mg Oral Daily   [MAR Hold] Chlorhexidine  Gluconate Cloth  6 each Topical Daily   [MAR Hold] donepezil   5 mg Oral QHS   [MAR Hold] feeding supplement  237 mL Oral BID BM   [MAR Hold] FLUoxetine   30 mg Oral Daily   [MAR Hold] melatonin  2.5 mg Oral QHS   [MAR Hold] memantine   10 mg Oral BID   [MAR Hold] mirabegron  ER  50 mg Oral Daily   [MAR Hold] nicotine   21 mg Transdermal Daily   [MAR Hold] pantoprazole   40 mg Oral Daily   [MAR Hold] rosuvastatin   10 mg Oral QHS   Continuous Infusions:  sodium chloride  150 mL/hr at 02/01/24 0130   heparin  1,550 Units/hr (02/01/24 0130)   sodium chloride      PRN Meds: [MAR Hold] acetaminophen , fentaNYL , Heparin  (Porcine) in NaCl, heparin  sodium (porcine), iohexol , lidocaine  (PF), midazolam , [MAR Hold]  morphine  injection, morphine  (PF), [MAR Hold] nitroGLYCERIN , [MAR Hold] ondansetron  (ZOFRAN ) IV, [MAR Hold] mouth rinse, sodium chloride , [MAR Hold] traZODone , verapamil   Allergies:   No Known Allergies  Social History:   Social History   Socioeconomic History   Marital status: Divorced    Spouse name: Not on file   Number of children: 4   Years of education: Not on file   Highest education level: GED or equivalent  Occupational History   Occupation: Animal nutritionist    Comment: retired  Tobacco Use   Smoking status: Every Day    Current packs/day: 1.50    Average packs/day: 1.5 packs/day for 61.5 years (92.3 ttl pk-yrs)    Types: Cigarettes    Start date: 07/28/1962   Smokeless tobacco: Never   Tobacco comments:    1PPD 01/30/2022  Vaping Use   Vaping status: Never Used  Substance and Sexual Activity   Alcohol  use: Yes    Alcohol /week: 1.0 standard drink of alcohol     Types: 1 Cans of beer per week    Comment: couple weeks   Drug use: Yes    Frequency: 7.0 times per week    Types: Marijuana    Comment: Endorsed heroin 06/2014, UDS also + benzos, opiates, THC, neg for cocaine at that time.   Sexual activity: Not Currently  Other Topics Concern   Not on file  Social History Narrative   Lives at home    Has kids and grandkids   Smoker x 55 years 1 ppd as of 07/2019 he did quit x 2 years    Drinks 1 pt liq qd as of 07/2019       Family History:    Family History  Problem Relation Age of Onset   Heart disease Father        Unclear details. Father passed in late 13's 2/2 cancer.   Colon cancer Father    Alcohol  abuse Sister    Drug  abuse Sister    Anxiety disorder Sister    Depression Sister    COPD Brother    Obesity Brother    Kidney disease Neg Hx    Prostate cancer Neg Hx      ROS:  Please see the history of present illness.  Per HPI.  No real nausea or vomiting.  Chronic cough; no fevers or chills. All other ROS reviewed and negative.     Physical Exam/Data: Vitals:   02/01/24 0035 02/01/24 0100 02/01/24 0130 02/01/24 0157  BP:  (!) 88/59 (!) 88/63   Pulse: 79 79 81   Resp: 14 17 18    Temp:   97.7 F (36.5 C)   TempSrc:   Oral   SpO2: 95% 97% 100% 96%  Weight:      Height:        Intake/Output Summary (Last 24 hours) at 02/01/2024 0319 Last data filed at 02/01/2024 0130 Gross per 24 hour  Intake 1713.42 ml  Output 150 ml  Net 1563.42 ml      01/31/2024    4:52 PM 01/31/2024    2:32 PM 06/02/2023     1:04 PM  Last 3 Weights  Weight (lbs) 212 lb 4.9 oz 250 lb 233 lb 4 oz  Weight (kg) 96.3 kg 113.399 kg 105.802 kg     Body mass index is 28.79 kg/m.  General:  Well nourished, well developed, in mild distress, but nontoxic-appearing. HEENT: normal Neck: no JVD Vascular: No carotid bruits; Distal pulses 2+ bilaterally Cardiac:  normal S1, S2; RRR; no murmur rubs or gallops Lungs:  clear to auscultation bilaterally, no wheezing, rhonchi or rales  Abd: soft, nontender, no hepatomegaly  Ext: no edema Musculoskeletal:  No deformities, BUE and BLE strength normal and equal Skin: warm and dry  Neuro:  CNs 2-12 intact, no focal abnormalities noted Psych:  Normal affect   EKG:  The EKG was personally reviewed and demonstrates: Sinus rhythm rate 74 bpm.  Left axis deviation-borderline IVCD and LBBB pattern.  Cannot exclude anteroseptal infarct.   Telemetry:  Telemetry was personally reviewed and demonstrates: Sinus rhythm  Relevant CV Studies: Echo 10/14/2021: EF 55 to 60%.  No RWMAA.  GR 1 DD.  Normal RV.  Mild aortic root dilation. Cardiac catheterization in 2011: Patent LAD stent with no other significant disease.  Laboratory Data: High Sensitivity Troponin:   Recent Labs  Lab 01/31/24 1434 01/31/24 1726  TROPONINIHS 4,107* 3,623*     Chemistry Recent Labs  Lab 01/31/24 1434 02/01/24 0249 02/01/24 0255 02/01/24 0300  NA 138 137 137 137  K 4.2 3.8 3.7 3.8  CL 103  --   --   --   CO2 26  --   --   --   GLUCOSE 136*  --   --   --   BUN 13  --   --   --   CREATININE 1.25*  --   --   --   CALCIUM  9.4  --   --   --   GFRNONAA 59*  --   --   --   ANIONGAP 9  --   --   --     No results for input(s): PROT, ALBUMIN, AST, ALT, ALKPHOS, BILITOT in the last 168 hours. Lipids No results for input(s): CHOL, TRIG, HDL, LABVLDL, LDLCALC, CHOLHDL in the last 168 hours.  Hematology Recent Labs  Lab 01/31/24 1434 02/01/24 0249 02/01/24 0255 02/01/24 0300   WBC 16.7*  --   --   --  RBC 4.29  --   --   --   HGB 13.6 10.9* 10.9* 10.9*  HCT 40.3 32.0* 32.0* 32.0*  MCV 93.9  --   --   --   MCH 31.7  --   --   --   MCHC 33.7  --   --   --   RDW 14.1  --   --   --   PLT 428*  --   --   --    Thyroid  No results for input(s): TSH, FREET4 in the last 168 hours.  BNPNo results for input(s): BNP, PROBNP in the last 168 hours.  DDimer No results for input(s): DDIMER in the last 168 hours.  Radiology/Studies:  DG Chest 2 View Result Date: 01/31/2024 CLINICAL DATA:  Chest pain.  Shortness of breath. EXAM: CHEST - 2 VIEW COMPARISON:  Chest radiographs 10/29/2020, 02/14/2020; CT chest, abdomen, and pelvis 01/16/2023 FINDINGS: Cardiac silhouette and mediastinal contours are within normal limits. Mild calcification within aortic arch. No significant change in curvilinear left-greater-than-right chronic scarring. Mild blunting of the left costophrenic angle is improved from most recent 10/29/2020 radiograph. Mild blunting of the posterior costophrenic angle on lateral view is also improved from prior. There is again flattening of the diaphragms and moderate hyperinflation. No acute airspace opacity is seen. No pneumothorax. Moderate multilevel degenerative disc changes of the thoracic spine. IMPRESSION: 1. No acute cardiopulmonary process. 2. Moderate hyperinflation and prominent left basilar chronic scarring. 3. Mild blunting of the left costophrenic angle is improved from most recent 10/29/2020 radiograph. This appears to represent resolution of the prior small pleural effusion, likely with mild chronic residual scarring. Electronically Signed   By: Tanda Lyons M.D.   On: 01/31/2024 15:02    Assessment and Plan: Initial presentation consistent with non-STEMI: Troponin elevation peaked of over 4000 and has subsequently reduced. => With ongoing chest discomfort and borderline hypotension making it difficult to treat with nitroglycerin  or narcotics, plans  for urgent catheterization tonight.  Will plan for right and left heart catheterization and possible PCI.  Was on Toprol  plus clopidogrel  at home-Toprol  on hold because of hypotension, he is not sure when the last time he took clopidogrel  was.  Depending upon the + his PCI, will need to restart antiplatelet agent. 2D echo pending in the morning, will therefore hold off on LV gram. If no evidence of obstructive CAD, would consider pericarditis given positional nature of his chest pain in the already reducing troponin level despite ongoing pain Consider mechanical support if indicated. COPD stable Continue home dose Crestor -10 mg.  Further plans based on cardiac catheterization.   Risk Assessment/Risk Scores:    TIMI Risk Score for Unstable Angina or Non-ST Elevation MI:   The patient's TIMI risk score is 6, which indicates a 41% risk of all cause mortality, new or recurrent myocardial infarction or need for urgent revascularization in the next 14 days.         For questions or updates, please contact [facility] Please consult [email or web address] for contact info under    Signed, Alm Clay, MD  02/01/2024 3:19 AM

## 2024-02-01 NOTE — Progress Notes (Addendum)
   02/01/24 1355  Spiritual Encounters  Type of Visit Initial  Care provided to: Pt and family (Daughter at bedside)  Referral source Nurse (RN/NT/LPN)  Reason for visit Advance directives  OnCall Visit No  Spiritual Framework  Presenting Themes Goals in life/care  Interventions  Spiritual Care Interventions Made Established relationship of care and support;Compassionate presence;Reflective listening;Decision-making support/facilitation  Intervention Outcomes  Outcomes Autonomy/agency;Awareness of health;Awareness of support  Spiritual Care Plan  Spiritual Care Issues Still Outstanding Chaplain will continue to follow (Chaplain will check on AD tomorrow 7.8.25)  Advance Directives (For Healthcare)  Does Patient Have a Medical Advance Directive? No  Would patient like information on creating a medical advance directive? Yes (Inpatient - patient defers creating a medical advance directive at this time - Information given) (Pt would like time to discuss the AD w/his daughter and fill out the information)   Left message w/two notaries; have not received an answer at this time.

## 2024-02-01 NOTE — Discharge Instructions (Signed)
 Food Resources  Agency Name: [facility] Agency Address: [address] [place], [place] [address] Phone: [phone] Website: [email or web address] Service(s) Offered: Housing services, self-sufficiency, congregate meal program, weatherization program, Event organiser program, emergency food assistance,  housing counseling, home ownership program, wheels - to work program.  [employer] free for 60 and older at various locations from [employer], Monday-Friday:  [employer], [address] [place], [phone] -[facility], [address] [place] [phone]  -[facility], [address] [place] [phone]  -[address], [address], [place], [phone]  Agency Name: [facility] Address: [phone] [address], [place], [place] [address] Phone: [phone] Website: [email or web address] Service(s) Offered: Home delivered hot, frozen, and emergency  meals. Grocery assistance program which matches  volunteers one-on-one with seniors unable to grocery shop  for themselves. Must be 60 years and older; less than 20  hours of in-home aide service, limited or no driving ability;  live alone or with someone with a disability; live in  [place].  Agency Name: [facility] [employer]) Address: [address] [place], [place] [address] Phone: [phone] Service(s) Offered: Food is served to shut-ins, homeless, elderly, and low income people in the community every Saturday (11:30 am-12:30 pm) and Sunday (12:30 pm-1:30pm). Volunteers also offer help and encouragement in seeking employment,  and spiritual guidance.  Agency Name: [facility] [employer] Address: [address] [place], [place] [address] Phone: [phone] Service(s) Offered: Child support services; child welfare services; food stamps; Medicaid; work first family assistance; and aid  with fuel,  rent, food and medicine.  Agency Name: [employer]itian Address: [address] [place], [place] Phone: [phone] Website: [email or web address] Services Offered: Monday 10:00am-12:00, 8:00pm-9:00pm, and Friday 10:00am-12:00.  Agency Name: [employer] [facility] Address: [address] [place], [place] [address] Phone: [phone] Website: [email or web address] Service(s) Offered: Serves weekday meals, open from 11:30 am- 1:00 pm., and 6:30-7:30pm, Monday-Wednesday-Friday distributes food 3:30-6pm, Monday-Wednesday-Friday.  Agency Name: [facility] Address: [address] [place], [place] Phone: [phone] Website: [email or web address] Services Offered: Distributes food the 4th Saturday of the month, starting at 8:00 am  Agency Name: [facility] Address: [phone] [address] [place], [place] [address] Phone: [phone] Website: [email or web address] Service(s) Offered: Bread of life, weekly food pantry. Open Wednesdays from 10:00am-noon.  Agency Name: [facility] [employer] Address: [address] [place], [place], [place] Phone: [phone] Services Offered: Distributes food 9am-1pm, Monday-Thursday. Call for details.  Agency Name: [facility] Address: [address], [place], [place] [address] Phone: [phone] Website: [email or web address] Service(s) Offered: Games developer. Call for assistance.  Agency Name: [facility] Address: [address] [place], [place] [address] Phone: [phone] Service Offered: Emergency Food Pantry. Call for appointment.  Agency Name: [facility] Address: [address] [place], [place] [address] Phone: [phone] Website: [email or web address] Services Offered: Games developer. Call for details  Agency Name: [facility] Address: [address] [place], [place] Phone:  [phone] Website: [email or web address] Service(s) Offered: Emergency Food Pantry. Call for details.  Agency Name: Holiday representative Address: [address], [place], [place] [address] Phone: [phone] or [phone] Website: [email or web address] Service(s) Offered: Distribute food 9am-11:30 am, Tuesday-Friday, and 1-3:30pm, Monday-Friday. Food pantry Monday-Friday 1pm-3pm, fresh items, Mon.-Wed.-Fri.  Agency Name: [facility] Address: [address] [place], [place] [address] Phone: [phone] Website: [email or web address] Services Offered: Distribute food Tues and Sats from 9:00am-noon.  Closed 1st Saturday of each month. Call for details  Agency Name: [employer] Address: Fayrene Boatman [facility] [address] [place] [address] Phone: [phone]  Services Offered: Delivers meals every Thursday     Transportation Resources for [employer] Area  Agency Name: [facility] Agency Address: [address] [place], [place] [address] Phone: [phone] Email: [email or web address] Website: [email or web address] Service(s) Offered: Housing services, self-sufficiency, congregate meal program, weatherization program, Field seismologist program, emergency food assistance,  housing counseling, home ownership program, wheels-towork program.  Agency Name: [facility] [employer] [phone]) Address: [address], [place], [place] [address] Phone: [phone] Website: [email or web address] Service(s) Offered: Transportation for [employer], subscription and demand response; Dial-a-Ride for citizens 71 years of age or older.  Agency Name: Department of Social Services Address: [address] [place], [place] [address] Phone: [phone] Service(s) Offered: Child support services; child welfare services; food stamps; Medicaid; work first family assistance;  and aid with fuel,  rent, food and medicine, transportation assistance.  Agency Name: Disabled [employer] ([employer]) Transportation  Network Phone: [phone] Service(s) Offered: Transports veterans to the [facility] medical center. Call  forty-eight hours in advance and leave the name, telephone  number, date, and time of appointment. Veteran will be  contacted by the driver the day before the appointment to  arrange a pick up point    [employer] currently provides door to door services. ACTA connects with PART daily for services to [facility]. ACTA also performs contract services to [employer] operates 27 vehicles, all but 3 mini-vans are equipped with lifts for special needs as well as the general public. ACTA drivers are each CDL certified and trained in First Aid and CPR. ACTA was established in 2002 by [employer]. An independent Industrial/product designer. ACTA operates via Cytogeneticist with required local 10% match funding from [place]. ACTA provides over 80,000 passenger trips each year, including [facility] and [employer] sites.  Call at least by 11 AM one business day prior to needing transportation  [employer].                      [place], [place] [address]     Office Hours: Monday-Friday  8 AM - 5 PM   Home Health arranged with [facility].  [facility] will call patient/daughter to arrange home care services.  Home Health services will start in 48-72 hours after discharge.

## 2024-02-02 ENCOUNTER — Other Ambulatory Visit: Payer: Self-pay

## 2024-02-02 ENCOUNTER — Inpatient Hospital Stay (HOSPITAL_COMMUNITY)
Admit: 2024-02-02 | Discharge: 2024-02-02 | Disposition: A | Discharge status: Home / Self Care | Attending: Obstetrics and Gynecology | Admitting: Obstetrics and Gynecology

## 2024-02-02 DIAGNOSIS — I309 Acute pericarditis, unspecified: Secondary | ICD-10-CM | POA: Diagnosis not present

## 2024-02-02 DIAGNOSIS — I4891 Unspecified atrial fibrillation: Secondary | ICD-10-CM | POA: Diagnosis not present

## 2024-02-02 DIAGNOSIS — I308 Other forms of acute pericarditis: Secondary | ICD-10-CM | POA: Diagnosis not present

## 2024-02-02 DIAGNOSIS — E440 Moderate protein-calorie malnutrition: Secondary | ICD-10-CM | POA: Insufficient documentation

## 2024-02-02 DIAGNOSIS — I214 Non-ST elevation (NSTEMI) myocardial infarction: Secondary | ICD-10-CM | POA: Diagnosis not present

## 2024-02-02 LAB — ECHOCARDIOGRAM COMPLETE
AR max vel: 4.04 cm2
AV Area VTI: 4.51 cm2
AV Area mean vel: 3.28 cm2
AV Mean grad: 2 mmHg
AV Peak grad: 3.5 mmHg
Ao pk vel: 0.93 m/s
Area-P 1/2: 6.65 cm2
Calc EF: 26.6 %
Height: 72 in
S' Lateral: 3.8 cm
Single Plane A2C EF: 31.8 %
Single Plane A4C EF: 18.1 %
Weight: 3506.2 [oz_av]

## 2024-02-02 LAB — LIPOPROTEIN A (LPA): Lipoprotein (a): 36.8 nmol/L — ABNORMAL HIGH (ref <75.0)

## 2024-02-02 LAB — BASIC METABOLIC PANEL WITH GFR
Anion gap: 9 (ref 5–15)
BUN: 17 mg/dL (ref 8–23)
CO2: 22 mmol/L (ref 22–32)
Calcium: 8.5 mg/dL — ABNORMAL LOW (ref 8.9–10.3)
Chloride: 105 mmol/L (ref 98–111)
Creatinine, Ser: 0.97 mg/dL (ref 0.61–1.24)
GFR, Estimated: >60 mL/min (ref >60)
Glucose, Bld: 122 mg/dL — ABNORMAL HIGH (ref 70–99)
Potassium: 3.7 mmol/L (ref 3.5–5.1)
Sodium: 136 mmol/L (ref 135–145)

## 2024-02-02 LAB — HEPARIN LEVEL (UNFRACTIONATED): Heparin Unfractionated: 0.35 [IU]/mL (ref 0.30–0.70)

## 2024-02-02 LAB — MAGNESIUM: Magnesium: 2.2 mg/dL (ref 1.7–2.4)

## 2024-02-02 LAB — APTT: aPTT: 143 s — ABNORMAL HIGH (ref 24–36)

## 2024-02-02 LAB — PHOSPHORUS: Phosphorus: 4 mg/dL (ref 2.5–4.6)

## 2024-02-02 LAB — TSH: TSH: 0.62 u[IU]/mL (ref 0.350–4.500)

## 2024-02-02 MED ORDER — AMIODARONE HCL IN DEXTROSE 360-4.14 MG/200ML-% IV SOLN
60.0000 mg/h | INTRAVENOUS | Status: AC
  Administered 2024-02-02: 60 mg/h via INTRAVENOUS
  Filled 2024-02-02: qty 200

## 2024-02-02 MED ORDER — DIGOXIN 125 MCG PO TABS
0.1250 mg | ORAL_TABLET | Freq: Every day | ORAL | Status: DC
  Administered 2024-02-03 – 2024-02-04 (×2): 0.125 mg via ORAL
  Filled 2024-02-02 (×2): qty 1

## 2024-02-02 MED ORDER — AMIODARONE LOAD VIA INFUSION
150.0000 mg | Freq: Once | INTRAVENOUS | Status: AC
  Administered 2024-02-02: 150 mg via INTRAVENOUS
  Filled 2024-02-02: qty 83.34

## 2024-02-02 MED ORDER — POTASSIUM CHLORIDE CRYS ER 20 MEQ PO TBCR
40.0000 meq | EXTENDED_RELEASE_TABLET | Freq: Once | ORAL | Status: AC
  Administered 2024-02-02: 40 meq via ORAL
  Filled 2024-02-02: qty 2

## 2024-02-02 MED ORDER — AMIODARONE IV BOLUS ONLY 150 MG/100ML
INTRAVENOUS | Status: AC
  Filled 2024-02-02: qty 100

## 2024-02-02 MED ORDER — AMIODARONE HCL IN DEXTROSE 360-4.14 MG/200ML-% IV SOLN
INTRAVENOUS | Status: AC
  Filled 2024-02-02: qty 200

## 2024-02-02 MED ORDER — DIGOXIN 250 MCG PO TABS
0.2500 mg | ORAL_TABLET | Freq: Four times a day (QID) | ORAL | Status: AC
  Administered 2024-02-02 (×2): 0.25 mg via ORAL
  Filled 2024-02-02 (×2): qty 1

## 2024-02-02 MED ORDER — HEPARIN (PORCINE) 25000 UT/250ML-% IV SOLN
1500.0000 [IU]/h | INTRAVENOUS | Status: DC
  Administered 2024-02-02 – 2024-02-03 (×2): 1500 [IU]/h via INTRAVENOUS
  Filled 2024-02-02 (×2): qty 250

## 2024-02-02 MED ORDER — AMIODARONE HCL IN DEXTROSE 360-4.14 MG/200ML-% IV SOLN
30.0000 mg/h | INTRAVENOUS | Status: DC
  Administered 2024-02-02 – 2024-02-03 (×2): 30 mg/h via INTRAVENOUS
  Filled 2024-02-02: qty 200

## 2024-02-02 MED ORDER — HEPARIN BOLUS VIA INFUSION
4000.0000 [IU] | Freq: Once | INTRAVENOUS | Status: AC
  Administered 2024-02-02: 4000 [IU] via INTRAVENOUS
  Filled 2024-02-02: qty 4000

## 2024-02-02 NOTE — Progress Notes (Signed)
   02/02/24 0845  Spiritual Encounters  Type of Visit Follow up  Care provided to: Patient  Referral source Chaplain assessment  Reason for visit Advance directives (I found two notaries but no volunteers. The ICU waiting room is empty, One of the volunteers downstairs didn't bring a driver's  license, the second one is underaged.)  OnCall Visit No  Advance Directives (For Healthcare)  Does Patient Have a Medical Advance Directive? No  Would patient like information on creating a medical advance directive?  (Pt has the info; we do not have volunteers to witness.)

## 2024-02-02 NOTE — Progress Notes (Signed)
*  PRELIMINARY RESULTS* Echocardiogram 2D Echocardiogram has been performed.  Christopher Orr 02/02/2024, 1:48 PM

## 2024-02-02 NOTE — TOC Progression Note (Signed)
 Transition of Care [facility]) - Progression Note    Patient Details  Name: Christopher Orr MRN: [id number] Date of Birth: 1946/01/18  Transition of [facility] CM/SW Contact  Issak Goley A Alantis Bethune, RN Phone Number: 02/02/2024, 2:08 PM  Clinical Narrative:    Chart reviewed.  Noted that patient has irregular rhythm.  Patient is on IV Amiodarone  for rate control.  Echo is pending.  Patient with A-fib in the setting of pericarditis.  I have left resources for patient at the bedside for food resources, transportation resources, home repair resources, and Septic tank repair resources.  I have provided patient a listing of home health agencies and he does not have a home health preference.  I have also left a message for patient's daughter Augustin about the above information per patient's information.    TOC will continue to follow for discharge planning.         Expected Discharge Plan: Home w Home Health Services Barriers to Discharge: Continued Medical Work up  Expected Discharge Plan and Services   Discharge Planning Services: CM Consult   Living arrangements for the past 2 months: Mobile Home                   DME Agency:  (Has a wooden walking cane at home.)                   Social Determinants of Health (SDOH) Interventions SDOH Screenings   Food Insecurity: Food Insecurity Present (01/31/2024)  Housing: Low Risk  (06/17/2023)  Transportation Needs: No Transportation Needs (01/31/2024)  Utilities: At Risk (01/31/2024)  Depression (PHQ2-9): High Risk (06/02/2023)  Financial Resource Strain: Low Risk  (04/29/2022)  Physical Activity: Inactive (12/25/2017)  Social Connections: Socially Isolated (01/31/2024)  Stress: No Stress Concern Present (06/25/2022)  Tobacco Use: High Risk (01/31/2024)    Readmission Risk Interventions     No data to display

## 2024-02-02 NOTE — Progress Notes (Addendum)
  Progress Note  Patient Name: Christopher Orr Date of Encounter: 02/02/2024 [facility] Cardiologist: Deatrice Cage, MD   Interval Summary    Patient went into Rapid Afib with rates into the 150s around 10:20AM started on IV heparin . Pressures were low and he was started on IV amiodarone . Patient felt short of breath. No chest pain reported.   Vital Signs Vitals:   02/02/24 1100 02/02/24 1115 02/02/24 1200 02/02/24 1227  BP: (!) 89/76 (!) 80/64 (!) 85/74 (!) 89/68  Pulse:   78   Resp: 18 17 (!) 21 18  Temp:      TempSrc:      SpO2:   92%   Weight:      Height:        Intake/Output Summary (Last 24 hours) at 02/02/2024 1234 Last data filed at 02/02/2024 0900 Gross per 24 hour  Intake 1100 ml  Output 450 ml  Net 650 ml      02/02/2024    5:00 AM 01/31/2024    4:52 PM 01/31/2024    2:32 PM  Last 3 Weights  Weight (lbs) 219 lb 2.2 oz 212 lb 4.9 oz 250 lb  Weight (kg) 99.4 kg 96.3 kg 113.399 kg      Telemetry/ECG  NSR>Afib RVR 150s - Personally Reviewed  Physical Exam  GEN: No acute distress.   Neck: No JVD Cardiac: tachy, Irreg Irreg, no murmurs, rubs, or gallops.  Respiratory: Clear to auscultation bilaterally. GI: Soft, nontender, non-distended  MS: No edema  Patient Profile: Christopher Orr is a 78 y.o. male with a hx of single-vessel CAD with LAD PCI back in 2006-patent by catheterization in 2011 with chronic HFpEF, along with COPD, HTN HLD who is being seen 02/01/2024 for the evaluation of non-STEMI with ongoing chest pain and borderline hypotension  Assessment & Plan   New Onset Afib RVR - new Afib RVR with hypotension this AM started on IV heparin  and IV amio load+infusion - reported SOB, no chest pain - CHADSAVASC (age x2, CHF, CAD) at least 4. Will require longterm a/c with Eliquis  - continue IV amiodarone  for now - Keep Mag>2 and K>4. Check TSH - echo pending - if patient does not medically convert may need cardioversion  NSTEMI - HS troponin  peaked over 4000 - due to ongoing chest pain and hypotension patient was taken for urgent cardiac cath.  - right and left cardiac cath showed normal coronaries with minimal pLAD disease and widely patent mLAD stent.it was felt presentation consistent with pericarditis - IV heparin  stopped - continue Crestor  10mg  daily - will stop ASA given heparin    Acute pericarditis -continue colchicine  0.6mg BID and ibuprofen  800mg  TID x 3 months - echo pending   Hypotension - given IV bolus in the cath lab and IVF    COPD - per IM   HLD - LDL 32 - continue Crestor  10mg  daily    For questions or updates, please contact [facility] Please consult www.[email or web address] for contact info under       Signed, Cloyce Blankenhorn VEAR Fishman, PA-C

## 2024-02-02 NOTE — Progress Notes (Signed)
 Patient went into Afib RVR at around 10:20 this AM. Cardiology was notified. Appears this is a new dx for the patient. He reported SOB. Heart rates were into the 150s and Bps dropped to 80s/60s. He was started on IV heparin  and IV amio load+ infusion.  Romyn Boswell [place]ster, PA-C

## 2024-02-02 NOTE — Progress Notes (Addendum)
 PROGRESS NOTE    Christopher Orr  FMW:[id number] DOB: Feb 04, 1946 DOA: 01/31/2024 PCP: System, Provider Not In     Brief Narrative:   From admission h and p   Christopher Orr is a 78 y.o. male with medical history significant of COPD, CAD s/p PCI in 2006, HTN, and HLD. He presented to [facility] ED complaining of chest pain that was left sided and midsternal. He states that he has been short of breath with it, but he denies diaphoresis or nausea. It was not exertional. EKG did not demonstrate any new ischemic changes, but his troponin was elevated at 4107.    Cardiology was contacted by the EDP. The patient is on a heparin  gtt. He will be NPO after midnight. Plan is for [facility] tomorrow.  Assessment & Plan:   Principal Problem:   NSTEMI (non-ST elevated myocardial infarction) (HCC) Active Problems:   COPD (chronic obstructive pulmonary disease) (HCC)   Hyperlipidemia   CAD (coronary artery disease)   Tobacco abuse   Lung cancer (HCC)   Alcohol  abuse   Bipolar disorder (HCC)   Stage 3a chronic kidney disease (HCC)   Dementia without behavioral disturbance (HCC)   Hypotension   Malnutrition of moderate degree   Atrial fibrillation with rapid ventricular response (HCC)  # Chest pain # Pericarditis # CAD Several days constant chest pain worse with exertion, trop elevated to 4000s. History DES to LAD in 2006. Started on IV heparin , taken urgently to cath which showed non-obstructive CAD, no culprit lesion. Cardiology thinks this is pericarditis. Low filling pressures, does not appear to have chf exacerbation. Respiratory viral panel negative. - tte pending - colchicine  and ibuprofen  per cardiology, will continue ppi - maintain tele - hold plavix  per cardiology until completed course ibuprofen , will also hold asa given start of heparin  as below - cont home statin - pantoprazole  while on heparin  and ibuprofen   # A-fib rvr New today with relative hypotension. New diagnosis, possibly  triggered by above pericarditis. - amio bolus and infusion per cardiology - heparin  per cardiology - f/u tsh - will need outpt sleep study  # Bipolar - cont home abilify , fluoxetine , trazodone   # COPD Doesn't appear exacerbated. O2 is hovering in the low 90s to upper 80s. Patient says he's previously been told he needs home oxygen  but isn't on it. Prior wedge resection as below. - consider chest CT when hemodynamically stable  # History lung cancer Nothing acute seen on CXR. Prior wedge resection - outpt onc f/u  # Dementia No behavioral disturbance - cont home donepezil , memantine   # OAB, BPH Bladder scan 7/7 wnl - home mirabegron , flomax   # Debility PT/OT advising home health   DVT prophylaxis: lovenox  Code Status: full Family Communication: daughter updated telephonically 7/8  Level of care: Stepdown Status is: Inpatient Remains inpatient appropriate because: severity of illness    Consultants:  cardiology  Procedures: LHC  Antimicrobials:  none    Subjective: Chest pain resolved, no dyspnea or palpitations  Objective: Vitals:   02/02/24 1045 02/02/24 1100 02/02/24 1115 02/02/24 1200  BP: (!) 83/65 (!) 89/76 (!) 80/64 (!) 85/74  Pulse:    78  Resp: (!) 21 18 17  (!) 21  Temp:      TempSrc:      SpO2:    92%  Weight:      Height:        Intake/Output Summary (Last 24 hours) at 02/02/2024 1228 Last data filed at 02/02/2024 0900 Gross per 24  hour  Intake 1100 ml  Output 600 ml  Net 500 ml   Filed Weights   01/31/24 1432 01/31/24 1652 02/02/24 0500  Weight: 113.4 kg 96.3 kg 99.4 kg    Examination:  General exam: Appears calm and comfortable  Respiratory system: Clear to auscultation. Respiratory effort normal. Cardiovascular system: tachycardic irregular Gastrointestinal system: Abdomen is nondistended, soft and nontender.   Central nervous system: Alert and oriented. No focal neurological deficits. Extremities: Symmetric 5 x 5 power. No  lower extremity edema Skin: No rashes, lesions or ulcers Psychiatry: Judgement and insight appear normal. Mood & affect appropriate.     Data Reviewed: I have personally reviewed following labs and imaging studies  CBC: Recent Labs  Lab 01/31/24 1434 02/01/24 0249 02/01/24 0255 02/01/24 0300 02/01/24 0414  WBC 16.7*  --   --   --  15.2*  HGB 13.6 10.9* 11.2*  10.9* 10.9* 11.6*  HCT 40.3 32.0* 33.0*  32.0* 32.0* 34.6*  MCV 93.9  --   --   --  94.0  PLT 428*  --   --   --  349   Basic Metabolic Panel: Recent Labs  Lab 01/31/24 1434 02/01/24 0249 02/01/24 0255 02/01/24 0300 02/01/24 0414 02/02/24 0315  NA 138 137 127*  137 137 136 136  K 4.2 3.8 3.5  3.7 3.8 4.1 3.7  CL 103  --   --   --  104 105  CO2 26  --   --   --  23 22  GLUCOSE 136*  --   --   --  134* 122*  BUN 13  --   --   --  16 17  CREATININE 1.25*  --   --   --  1.21 0.97  CALCIUM  9.4  --   --   --  8.6* 8.5*  MG  --   --   --   --   --  2.2  PHOS  --   --   --   --   --  4.0   GFR: Estimated Creatinine Clearance: 77.8 mL/min (by C-G formula based on SCr of 0.97 mg/dL). Liver Function Tests: No results for input(s): AST, ALT, ALKPHOS, BILITOT, PROT, ALBUMIN in the last 168 hours. No results for input(s): LIPASE, AMYLASE in the last 168 hours. No results for input(s): AMMONIA in the last 168 hours. Coagulation Profile: Recent Labs  Lab 01/31/24 1550  INR 1.0   Cardiac Enzymes: No results for input(s): CKTOTAL, CKMB, CKMBINDEX, TROPONINI in the last 168 hours. BNP (last 3 results) No results for input(s): PROBNP in the last 8760 hours. HbA1C: No results for input(s): HGBA1C in the last 72 hours. CBG: Recent Labs  Lab 01/31/24 1650  GLUCAP 113*   Lipid Profile: Recent Labs    02/01/24 0414  CHOL 78  HDL 37*  LDLCALC 32  TRIG 43  CHOLHDL 2.1   Thyroid  Function Tests: No results for input(s): TSH, T4TOTAL, FREET4, T3FREE, THYROIDAB in the last  72 hours. Anemia Panel: No results for input(s): VITAMINB12, FOLATE, FERRITIN, TIBC, IRON, RETICCTPCT in the last 72 hours. Urine analysis:    Component Value Date/Time   COLORURINE YELLOW (A) 02/19/2023 1850   APPEARANCEUR CLEAR (A) 02/19/2023 1850   APPEARANCEUR Clear 07/15/2022 1448   LABSPEC 1.011 02/19/2023 1850   PHURINE 5.0 02/19/2023 1850   GLUCOSEU NEGATIVE 02/19/2023 1850   HGBUR NEGATIVE 02/19/2023 1850   BILIRUBINUR NEGATIVE 02/19/2023 1850   BILIRUBINUR Negative 07/15/2022 1448  KETONESUR NEGATIVE 02/19/2023 1850   PROTEINUR NEGATIVE 02/19/2023 1850   UROBILINOGEN 0.2 07/27/2014 0236   NITRITE NEGATIVE 02/19/2023 1850   LEUKOCYTESUR NEGATIVE 02/19/2023 1850   Sepsis Labs: @LABRCNTIP (procalcitonin:4,lacticidven:4)  ) Recent Results (from the past 240 hours)  MRSA Next Gen by PCR, Nasal     Status: None   Collection Time: 01/31/24  7:22 PM   Specimen: Nasal Mucosa; Nasal Swab  Result Value Ref Range Status   MRSA by PCR Next Gen NOT DETECTED NOT DETECTED Final    Comment: (NOTE) The GeneXpert MRSA Assay (FDA approved for NASAL specimens only), is one component of a comprehensive MRSA colonization surveillance program. It is not intended to diagnose MRSA infection nor to guide or monitor treatment for MRSA infections. Test performance is not FDA approved in patients less than 16 years old. Performed at [facility], [address] [place], [place] [address]   SARS Coronavirus 2 by RT PCR (hospital order, performed in [facility] hospital lab) *cepheid single result test* Anterior Nasal Swab     Status: None   Collection Time: 02/01/24 12:03 PM   Specimen: Anterior Nasal Swab  Result Value Ref Range Status   SARS Coronavirus 2 by RT PCR NEGATIVE NEGATIVE Final    Comment: (NOTE) SARS-CoV-2 target nucleic acids are NOT DETECTED.  The SARS-CoV-2 RNA is generally detectable in upper and lower respiratory specimens during the acute phase  of infection. The lowest concentration of SARS-CoV-2 viral copies this assay can detect is 250 copies / mL. A negative result does not preclude SARS-CoV-2 infection and should not be used as the sole basis for treatment or other patient management decisions.  A negative result may occur with improper specimen collection / handling, submission of specimen other than nasopharyngeal swab, presence of viral mutation(s) within the areas targeted by this assay, and inadequate number of viral copies (<250 copies / mL). A negative result must be combined with clinical observations, patient history, and epidemiological information.  Fact Sheet for Patients:   RoadLapTop.co.za  Fact Sheet for Healthcare Providers: http://kim-miller.com/  This test is not yet approved or  cleared by the [place]  FDA and has been authorized for detection and/or diagnosis of SARS-CoV-2 by FDA under an Emergency Use Authorization (EUA).  This EUA will remain in effect (meaning this test can be used) for the duration of the COVID-19 declaration under Section 564(b)(1) of the Act, 21 U.S.C. section 360bbb-3(b)(1), unless the authorization is terminated or revoked sooner.  Performed at [facility], [address] [place], [place] [address]   Respiratory (~20 pathogens) panel by PCR     Status: None   Collection Time: 02/01/24 12:03 PM   Specimen: Nasopharyngeal Swab; Respiratory  Result Value Ref Range Status   Adenovirus NOT DETECTED NOT DETECTED Final   Coronavirus 229E NOT DETECTED NOT DETECTED Final    Comment: (NOTE) The Coronavirus on the Respiratory Panel, DOES NOT test for the novel  Coronavirus (2019 nCoV)    Coronavirus HKU1 NOT DETECTED NOT DETECTED Final   Coronavirus NL63 NOT DETECTED NOT DETECTED Final   Coronavirus OC43 NOT DETECTED NOT DETECTED Final   Metapneumovirus NOT DETECTED NOT DETECTED Final   Rhinovirus / Enterovirus NOT  DETECTED NOT DETECTED Final   Influenza A NOT DETECTED NOT DETECTED Final   Influenza B NOT DETECTED NOT DETECTED Final   Parainfluenza Virus 1 NOT DETECTED NOT DETECTED Final   Parainfluenza Virus 2 NOT DETECTED NOT DETECTED Final   Parainfluenza Virus 3  NOT DETECTED NOT DETECTED Final   Parainfluenza Virus 4 NOT DETECTED NOT DETECTED Final   Respiratory Syncytial Virus NOT DETECTED NOT DETECTED Final   Bordetella pertussis NOT DETECTED NOT DETECTED Final   Bordetella Parapertussis NOT DETECTED NOT DETECTED Final   Chlamydophila pneumoniae NOT DETECTED NOT DETECTED Final   Mycoplasma pneumoniae NOT DETECTED NOT DETECTED Final    Comment: Performed at [facility], [address], [place], [place] [address]         Radiology Studies: CARDIAC CATHETERIZATION Result Date: 02/01/2024 Images from the original result were not included. Dominance: Right Angiographically normal coronary arteries with minimal proximal LAD disease and widely patent mid LAD stent. Suspect presentation is not consistent with non-STEMI given lack of culprit lesion-more consistent with PERICARDITIS Relatively Normal Right Heart Cath Numbers with mean PAP of 19 mmHg and PCWP of 12 mmHg along with LVEDP of 18 mmHg. Hypotensive on arrival with pressures in the high 80s that dropped into the 70s with verapamil .  With 250 mL x 2 IVF bolus, blood pressures upon completion of procedure were 100-105/50 mmHg by radial art line. => Lactate was 0.8-not consistent with shock. RECOMMENDATIONS I have started colchicine  0.6 mg twice daily and ibuprofen  800 mg 3 times daily Will hydrate gently for the next several hours-patient had relatively low filling pressures, improved with bolus -- Could probably transfer to telemetry later on in the morning Check 2D echo in the morning If pressures stable Patient had been on Plavix  per med list prior to arrival, but with plan for high-dose ibuprofen , would hold off on restarting Plavix  until  completed ibuprofen  course. IV heparin  discontinued Alm Clay, MD  DG Chest 2 View Result Date: 01/31/2024 CLINICAL DATA:  Chest pain.  Shortness of breath. EXAM: CHEST - 2 VIEW COMPARISON:  Chest radiographs 10/29/2020, 02/14/2020; CT chest, abdomen, and pelvis 01/16/2023 FINDINGS: Cardiac silhouette and mediastinal contours are within normal limits. Mild calcification within aortic arch. No significant change in curvilinear left-greater-than-right chronic scarring. Mild blunting of the left costophrenic angle is improved from most recent 10/29/2020 radiograph. Mild blunting of the posterior costophrenic angle on lateral view is also improved from prior. There is again flattening of the diaphragms and moderate hyperinflation. No acute airspace opacity is seen. No pneumothorax. Moderate multilevel degenerative disc changes of the thoracic spine. IMPRESSION: 1. No acute cardiopulmonary process. 2. Moderate hyperinflation and prominent left basilar chronic scarring. 3. Mild blunting of the left costophrenic angle is improved from most recent 10/29/2020 radiograph. This appears to represent resolution of the prior small pleural effusion, likely with mild chronic residual scarring. Electronically Signed   By: Tanda Lyons M.D.   On: 01/31/2024 15:02        Scheduled Meds:  ARIPiprazole   10 mg Oral QHS   aspirin  EC  81 mg Oral Daily   Chlorhexidine  Gluconate Cloth  6 each Topical Daily   colchicine   0.6 mg Oral BID   donepezil   5 mg Oral QHS   feeding supplement  237 mL Oral TID BM   FLUoxetine   30 mg Oral Daily   folic acid   1 mg Oral Daily   ibuprofen   800 mg Oral TID   melatonin  2.5 mg Oral QHS   memantine   10 mg Oral BID   mirabegron  ER  50 mg Oral Daily   multivitamin with minerals  1 tablet Oral Daily   nicotine   21 mg Transdermal Daily   pantoprazole   40 mg Oral Daily   potassium  chloride  40 mEq Oral Once   rosuvastatin   10 mg Oral QHS   sodium chloride  flush  3 mL Intravenous  Q12H   tamsulosin   0.4 mg Oral Daily   thiamine   100 mg Oral Daily   Continuous Infusions:  amiodarone  60 mg/hr (02/02/24 1146)   Followed by   amiodarone      heparin  1,500 Units/hr (02/02/24 1154)     LOS: 2 days     Devaughn KATHEE Ban, MD [facility]   If 7PM-7AM, please contact night-coverage [email or web address] Password [facility] 02/02/2024, 12:28 PM

## 2024-02-02 NOTE — Consult Note (Signed)
 PHARMACY - ANTICOAGULATION CONSULT NOTE  Pharmacy Consult for IV Heparin  Indication: atrial fibrillation  Patient Measurements: Height: 6' (182.9 cm) Weight: 99.4 kg (219 lb 2.2 oz) IBW/kg (Calculated) : 77.6 HEPARIN  DW (KG): 101.9  Labs: Recent Labs    01/31/24 1434 01/31/24 1550 01/31/24 1726 02/01/24 0017 02/01/24 0249 02/01/24 0255 02/01/24 0300 02/01/24 0414 02/01/24 1012 02/02/24 0315  HGB 13.6  --   --   --    < > 11.2*  10.9* 10.9* 11.6*  --   --   HCT 40.3  --   --   --    < > 33.0*  32.0* 32.0* 34.6*  --   --   PLT 428*  --   --   --   --   --   --  349  --   --   LABPROT  --  14.2  --   --   --   --   --   --   --   --   INR  --  1.0  --   --   --   --   --   --   --   --   HEPARINUNFRC  --   --   --  0.22*  --   --   --   --  <0.10*  --   CREATININE 1.25*  --   --   --   --   --   --  1.21  --  0.97  TROPONINIHS 4,107*  --  3,623*  --   --   --   --   --   --   --    < > = values in this interval not displayed.    Estimated Creatinine Clearance: 77.8 mL/min (by C-G formula based on SCr of 0.97 mg/dL).   Medical History: Past Medical History:  Diagnosis Date   Abnormal findings on diagnostic imaging of digestive system    Acute kidney injury (HCC) 07/28/2014   Alcohol  abuse    pt states it is marijuana   Anemia    Arthritis    Arthritis 08/15/2019   Aspiration pneumonia (HCC) 07/28/2014   Bilateral inguinal hernia, without obstruction or gangrene, recurrent    Bipolar disorder (HCC)    BPH (benign prostatic hypertrophy) with urinary obstruction    CAD (coronary artery disease)    a. 2006 PCI/DES to LAD, EF 60%.   Chest pain 07/28/2014   Chronic heart failure with preserved ejection fraction (HFpEF) (HCC)    a. 07/2014 Echo: EF 55%; b. 11/2020 Echo: EF 50-55%, mod LVH, GrI DD, mildly enlarged RV w/ nl fxn, mild BAE.   Chronic pancreatitis (HCC)    Chronic viral hepatitis C (HCC) 09/24/2014   Overview:   GT 1a.   started on Harvoni 11/03/2014 for  planned 12 week course, elastography shows cirrhosis.   Cigarette smoker 08/05/2018   Cirrhosis (HCC)    Cirrhosis of liver (HCC) 08/15/2019   Noted in 2016 imaging with alcohol  abuse and h/o hep C tx s/p harvoni       COPD (chronic obstructive pulmonary disease) (HCC)    Coronary artery disease involving native heart with angina pectoris (HCC) 05/31/2018   COVID-19    08/22/20   Dementia (HCC)    early dementia   Depression    Depression, recurrent (HCC) 06/14/2018   Dilated ascending aorta and aortic root (HCC)    a. 11/2020 Echo: Ao root 44mm, Asc Ao 40mm. Ao  arch 37mm.   Diverticulitis    12/06/20 [facility]    Diverticulosis    Drug use    Dyspnea    ED (erectile dysfunction)    Elevated troponin I level 07/28/2014   Gastritis    GERD (gastroesophageal reflux disease)    Headache    occasional migraines   Hepatitis C    treated   Hernia, inguinal, bilateral 08/15/2019   Recurrent s/p repair       History of lumbar fusion    History of pancreatitis 08/15/2019   X 2    History of prostate cancer 08/15/2019   2017 s/p brachytherapy with seeds   F/u Dr. Penne       Hyperlipidemia    Hypertension    patient denies having high blood pressure   Internal hemorrhoids 01/29/2021   Leg edema 01/03/2022   Lung cancer (HCC)    Malignant neoplasm of lower lobe of left lung (HCC) 05/15/2020   Memory loss 08/15/2019   Nodule of lower lobe of left lung 11/09/2019   Nodule of middle lobe of right lung 08/15/2019   Other chronic pain 01/29/2021   Pancreatitis    x 2    Pneumonia    07/06/18    Polyp of transverse colon    Poor nutrition 06/09/2022   Premature atrial contractions    a. 04/2021 Zio: Freq PACs w/ 8% burden.   Prostate cancer [facility])    with seeds   PSVT (paroxysmal supraventricular tachycardia) (HCC)    a. 04/2021 Zio: Avg HR 80 (60-210). 42 SVT runs - longest 5 beats @ 116, fastest 210 x 5 beats. Freq PACs (8%).   PTSD (post-traumatic stress disorder)    Rib  fracture    s/p fall 03/15/19    Right ear pain 07/18/2021   S/P lumbar fusion 12/27/2020   Sinus bradycardia 01/29/2021   Sinus disease    SOB (shortness of breath) on exertion 05/15/2020   Squamous carcinoma of lung, left (HCC) 11/26/2019   Syncope 01/29/2021   Syncope due to orthostatic hypotension 01/23/2021   Umbilical hernia without obstruction and without gangrene    Urge incontinence    Viral upper respiratory tract infection 07/18/2021   Medications:  No anticoagulation prior to admission per my chart review. Takes Plavix   Assessment: Patient is a 78 y/o M admitted with NSTEMI secondary to suspected pericarditis. Now with new-onset Afib with RVR. Pharmacy consulted to manage heparin .  CBC notable for stable anemia. INR normal. aPTT ordered and pending  Goal of Therapy:  Heparin  level 0.3-0.7 units/ml Monitor platelets by anticoagulation protocol: Yes   Plan:  --Heparin  4000 unit IV bolus followed by continuous infusion at 1500 units/hr --Heparin  level 8 hours from initiation --Daily CBC per protocol while on IV heparin   Laquandra Carrillo B Demonie Kassa 02/02/2024,11:15 AM

## 2024-02-02 NOTE — Consult Note (Signed)
 PHARMACY - ANTICOAGULATION CONSULT NOTE  Pharmacy Consult for IV Heparin  Indication: atrial fibrillation  Patient Measurements: Height: 6' (182.9 cm) Weight: 99.4 kg (219 lb 2.2 oz) IBW/kg (Calculated) : 77.6 HEPARIN  DW (KG): 101.9  Labs: Recent Labs    01/31/24 1434 01/31/24 1550 01/31/24 1726 02/01/24 0017 02/01/24 0249 02/01/24 0255 02/01/24 0300 02/01/24 0414 02/01/24 1012 02/02/24 0315 02/02/24 1218 02/02/24 2033  HGB 13.6  --   --   --    < > 11.2*  10.9* 10.9* 11.6*  --   --   --   --   HCT 40.3  --   --   --    < > 33.0*  32.0* 32.0* 34.6*  --   --   --   --   PLT 428*  --   --   --   --   --   --  349  --   --   --   --   APTT  --   --   --   --   --   --   --   --   --   --  143*  --   LABPROT  --  14.2  --   --   --   --   --   --   --   --   --   --   INR  --  1.0  --   --   --   --   --   --   --   --   --   --   HEPARINUNFRC  --   --   --  0.22*  --   --   --   --  <0.10*  --   --  0.35  CREATININE 1.25*  --   --   --   --   --   --  1.21  --  0.97  --   --   TROPONINIHS 4,107*  --  3,623*  --   --   --   --   --   --   --   --   --    < > = values in this interval not displayed.    Estimated Creatinine Clearance: 77.8 mL/min (by C-G formula based on SCr of 0.97 mg/dL).   Medical History: Past Medical History:  Diagnosis Date   Abnormal findings on diagnostic imaging of digestive system    Acute kidney injury (HCC) 07/28/2014   Alcohol  abuse    pt states it is marijuana   Anemia    Arthritis    Arthritis 08/15/2019   Aspiration pneumonia (HCC) 07/28/2014   Bilateral inguinal hernia, without obstruction or gangrene, recurrent    Bipolar disorder (HCC)    BPH (benign prostatic hypertrophy) with urinary obstruction    CAD (coronary artery disease)    a. 2006 PCI/DES to LAD, EF 60%.   Chest pain 07/28/2014   Chronic heart failure with preserved ejection fraction (HFpEF) (HCC)    a. 07/2014 Echo: EF 55%; b. 11/2020 Echo: EF 50-55%, mod LVH, GrI DD,  mildly enlarged RV w/ nl fxn, mild BAE.   Chronic pancreatitis (HCC)    Chronic viral hepatitis C (HCC) 09/24/2014   Overview:   GT 1a.   started on Harvoni 11/03/2014 for planned 12 week course, elastography shows cirrhosis.   Cigarette smoker 08/05/2018   Cirrhosis (HCC)    Cirrhosis of liver (HCC) 08/15/2019   Noted in  2016 imaging with alcohol  abuse and h/o hep C tx s/p harvoni       COPD (chronic obstructive pulmonary disease) (HCC)    Coronary artery disease involving native heart with angina pectoris (HCC) 05/31/2018   COVID-19    08/22/20   Dementia (HCC)    early dementia   Depression    Depression, recurrent (HCC) 06/14/2018   Dilated ascending aorta and aortic root (HCC)    a. 11/2020 Echo: Ao root 44mm, Asc Ao 40mm. Ao arch 37mm.   Diverticulitis    12/06/20 [facility]    Diverticulosis    Drug use    Dyspnea    ED (erectile dysfunction)    Elevated troponin I level 07/28/2014   Gastritis    GERD (gastroesophageal reflux disease)    Headache    occasional migraines   Hepatitis C    treated   Hernia, inguinal, bilateral 08/15/2019   Recurrent s/p repair       History of lumbar fusion    History of pancreatitis 08/15/2019   X 2    History of prostate cancer 08/15/2019   2017 s/p brachytherapy with seeds   F/u Dr. Penne       Hyperlipidemia    Hypertension    patient denies having high blood pressure   Internal hemorrhoids 01/29/2021   Leg edema 01/03/2022   Lung cancer (HCC)    Malignant neoplasm of lower lobe of left lung (HCC) 05/15/2020   Memory loss 08/15/2019   Nodule of lower lobe of left lung 11/09/2019   Nodule of middle lobe of right lung 08/15/2019   Other chronic pain 01/29/2021   Pancreatitis    x 2    Pneumonia    07/06/18    Polyp of transverse colon    Poor nutrition 06/09/2022   Premature atrial contractions    a. 04/2021 Zio: Freq PACs w/ 8% burden.   Prostate cancer [facility])    with seeds   PSVT (paroxysmal supraventricular tachycardia)  (HCC)    a. 04/2021 Zio: Avg HR 80 (60-210). 42 SVT runs - longest 5 beats @ 116, fastest 210 x 5 beats. Freq PACs (8%).   PTSD (post-traumatic stress disorder)    Rib fracture    s/p fall 03/15/19    Right ear pain 07/18/2021   S/P lumbar fusion 12/27/2020   Sinus bradycardia 01/29/2021   Sinus disease    SOB (shortness of breath) on exertion 05/15/2020   Squamous carcinoma of lung, left (HCC) 11/26/2019   Syncope 01/29/2021   Syncope due to orthostatic hypotension 01/23/2021   Umbilical hernia without obstruction and without gangrene    Urge incontinence    Viral upper respiratory tract infection 07/18/2021   Medications:  No anticoagulation prior to admission per my chart review. Takes Plavix   Assessment: Patient is a 78 y/o M admitted with NSTEMI secondary to suspected pericarditis. Now with new-onset Afib with RVR. Pharmacy consulted to manage heparin .  CBC notable for stable anemia. INR normal. aPTT ordered and pending  Goal of Therapy:  Heparin  level 0.3-0.7 units/ml Monitor platelets by anticoagulation protocol: Yes  7/08 @ 2033: HL 0.35  Therapeutic x 1   Plan:  --Continue heparin  infusion at 1500 units/hr --Confirmatory heparin  level with tomorrow AM labs --Daily CBC per protocol while on IV heparin   Christopher Orr, PharmD Clinical Pharmacist 02/02/2024 9:29 PM

## 2024-02-03 ENCOUNTER — Telehealth (HOSPITAL_COMMUNITY): Payer: Self-pay | Admitting: Pharmacy Technician

## 2024-02-03 ENCOUNTER — Other Ambulatory Visit (HOSPITAL_COMMUNITY): Payer: Self-pay

## 2024-02-03 DIAGNOSIS — I319 Disease of pericardium, unspecified: Secondary | ICD-10-CM | POA: Diagnosis not present

## 2024-02-03 DIAGNOSIS — I429 Cardiomyopathy, unspecified: Secondary | ICD-10-CM

## 2024-02-03 DIAGNOSIS — I428 Other cardiomyopathies: Secondary | ICD-10-CM

## 2024-02-03 DIAGNOSIS — I4891 Unspecified atrial fibrillation: Secondary | ICD-10-CM | POA: Diagnosis not present

## 2024-02-03 DIAGNOSIS — I214 Non-ST elevation (NSTEMI) myocardial infarction: Secondary | ICD-10-CM | POA: Diagnosis not present

## 2024-02-03 LAB — PHOSPHORUS: Phosphorus: 3.8 mg/dL (ref 2.5–4.6)

## 2024-02-03 LAB — CBC
HCT: 32.8 % — ABNORMAL LOW (ref 39.0–52.0)
Hemoglobin: 11 g/dL — ABNORMAL LOW (ref 13.0–17.0)
MCH: 31.2 pg (ref 26.0–34.0)
MCHC: 33.5 g/dL (ref 30.0–36.0)
MCV: 92.9 fL (ref 80.0–100.0)
Platelets: 296 K/uL (ref 150–400)
RBC: 3.53 MIL/uL — ABNORMAL LOW (ref 4.22–5.81)
RDW: 14.3 % (ref 11.5–15.5)
WBC: 14.8 K/uL — ABNORMAL HIGH (ref 4.0–10.5)
nRBC: 0 % (ref 0.0–0.2)

## 2024-02-03 LAB — BASIC METABOLIC PANEL WITH GFR
Anion gap: 7 (ref 5–15)
BUN: 17 mg/dL (ref 8–23)
CO2: 21 mmol/L — ABNORMAL LOW (ref 22–32)
Calcium: 8.5 mg/dL — ABNORMAL LOW (ref 8.9–10.3)
Chloride: 109 mmol/L (ref 98–111)
Creatinine, Ser: 1.1 mg/dL (ref 0.61–1.24)
GFR, Estimated: >60 mL/min (ref >60)
Glucose, Bld: 100 mg/dL — ABNORMAL HIGH (ref 70–99)
Potassium: 4.1 mmol/L (ref 3.5–5.1)
Sodium: 137 mmol/L (ref 135–145)

## 2024-02-03 LAB — MAGNESIUM: Magnesium: 2.2 mg/dL (ref 1.7–2.4)

## 2024-02-03 LAB — HEPARIN LEVEL (UNFRACTIONATED): Heparin Unfractionated: 0.5 [IU]/mL (ref 0.30–0.70)

## 2024-02-03 MED ORDER — AMIODARONE HCL 200 MG PO TABS
400.0000 mg | ORAL_TABLET | Freq: Two times a day (BID) | ORAL | Status: DC
  Administered 2024-02-03 – 2024-02-04 (×3): 400 mg via ORAL
  Filled 2024-02-03 (×4): qty 2

## 2024-02-03 MED ORDER — APIXABAN 5 MG PO TABS
5.0000 mg | ORAL_TABLET | Freq: Two times a day (BID) | ORAL | Status: DC
  Administered 2024-02-03 – 2024-02-04 (×3): 5 mg via ORAL
  Filled 2024-02-03 (×3): qty 1

## 2024-02-03 MED ORDER — POTASSIUM CHLORIDE CRYS ER 20 MEQ PO TBCR
10.0000 meq | EXTENDED_RELEASE_TABLET | Freq: Once | ORAL | Status: AC
  Administered 2024-02-03: 10 meq via ORAL
  Filled 2024-02-03: qty 1

## 2024-02-03 MED ORDER — FUROSEMIDE 20 MG PO TABS
20.0000 mg | ORAL_TABLET | Freq: Every day | ORAL | Status: DC
  Administered 2024-02-03 – 2024-02-04 (×2): 20 mg via ORAL
  Filled 2024-02-03 (×2): qty 1

## 2024-02-03 MED ORDER — LOSARTAN POTASSIUM 25 MG PO TABS
12.5000 mg | ORAL_TABLET | Freq: Every day | ORAL | Status: DC
  Administered 2024-02-03 – 2024-02-04 (×2): 12.5 mg via ORAL
  Filled 2024-02-03 (×2): qty 0.5

## 2024-02-03 NOTE — Telephone Encounter (Signed)

## 2024-02-03 NOTE — TOC Progression Note (Signed)
 Transition of Care [place]) - Progression Note    Patient Details  Name: Christopher Orr MRN: [id number] Date of Birth: 1946-04-30  Transition of Care [facility]) CM/SW Contact  Macio Kissoon A Iwalani Templeton, RN Phone Number: 02/03/2024, 9:45 AM  Clinical Narrative:    Chart reviewed.  I have spoken with Channing from [facility] she informs me that she is able to accept patient for home health PT/OT needs on discharge.    TOC will continue to follow for discharge planning.     Expected Discharge Plan: Home w Home Health Services Barriers to Discharge: Continued Medical Work up  Expected Discharge Plan and Services   Discharge Planning Services: CM Consult   Living arrangements for the past 2 months: Mobile Home                   DME Agency:  (Has a wooden walking cane at home.)                   Social Determinants of Health (SDOH) Interventions SDOH Screenings   Food Insecurity: Food Insecurity Present (01/31/2024)  Housing: Low Risk  (06/17/2023)  Transportation Needs: No Transportation Needs (01/31/2024)  Utilities: At Risk (01/31/2024)  Depression (PHQ2-9): High Risk (06/02/2023)  Financial Resource Strain: Low Risk  (04/29/2022)  Physical Activity: Inactive (12/25/2017)  Social Connections: Socially Isolated (01/31/2024)  Stress: No Stress Concern Present (06/25/2022)  Tobacco Use: High Risk (01/31/2024)    Readmission Risk Interventions     No data to display

## 2024-02-03 NOTE — Progress Notes (Signed)
 Heart Failure Stewardship Pharmacy Note  PCP: System, Provider Not In PCP-Cardiologist: Deatrice Cage, MD  HPI: Christopher Orr is a 78 y.o. male with COPD, CAD s/p PCI in 2006, HTN, HLD, lung & prostate cancer who presented with chest pain and shortness of breath. On admission, BNP was not measured, HS-troponin was 4107 > 3623, ESR 32, CRP 12, and lactic acid was 0.9. Chest x-ray noted no acute abnormality. ECG with AF RVR and hypotension. Patient underwent LHC and no culprit lesion was found. Patient was diagnosed with pericarditis and started on colchicine  and ibuprofen .  Pertinent cardiac history: Longstanding CAD with prior PCI to LAD in 2006. Echo in 07/2014 with LVEF of 55%. Echo in 04/2018 with normal LVEF. Echo 11/2020 and 09/2021 with normal LVEF and grade I diastolic dysfunction. LHC 02/01/2023 noted no culprit lesion. RHC showed RA of 3, mPAP of 19, PCWP of 12, CO of 5.45, CI of 2.49. Echocardiogram 02/02/24 showed LVEF of 25-30, moderate LVH, mild MR.   Pertinent Lab Values: Creatinine, Ser  Date Value Ref Range Status  02/03/2024 1.10 0.61 - 1.24 mg/dL Final   BUN  Date Value Ref Range Status  02/03/2024 17 8 - 23 mg/dL Final  95/95/7976 18 8 - 27 mg/dL Final   Potassium  Date Value Ref Range Status  02/03/2024 4.1 3.5 - 5.1 mmol/L Final   Sodium  Date Value Ref Range Status  02/03/2024 137 135 - 145 mmol/L Final  10/29/2021 142 134 - 144 mmol/L Final   Magnesium   Date Value Ref Range Status  02/03/2024 2.2 1.7 - 2.4 mg/dL Final    Comment:    Performed at [facility], [address] [place], [place] [address]   Hgb A1c MFr Bld  Date Value Ref Range Status  04/08/2022 6.1 4.6 - 6.5 % Final    Comment:    Glycemic Control Guidelines for People with Diabetes:Non Diabetic:  <6%Goal of Therapy: <7%Additional Action Suggested:  >8%    TSH  Date Value Ref Range Status  02/02/2024 0.620 0.350 - 4.500 uIU/mL Final    Comment:    Performed by a 3rd  Generation assay with a functional sensitivity of <=0.01 uIU/mL. Performed at [facility], [address] [place], [place] [address]   05/27/2022 0.48 0.35 - 5.50 uIU/mL Final    Vital Signs: Temp:  [97.6 F (36.4 C)-98 F (36.7 C)] 97.7 F (36.5 C) (07/09 0800) Pulse Rate:  [71-139] 72 (07/09 0630) Cardiac Rhythm: Normal sinus rhythm (07/09 0700) Resp:  [10-28] 12 (07/09 0630) BP: (73-143)/(45-116) 104/50 (07/09 0630) SpO2:  [82 %-100 %] 100 % (07/09 0630) Weight:  [99 kg (218 lb 4.1 oz)] 99 kg (218 lb 4.1 oz) (07/09 0500)  Intake/Output Summary (Last 24 hours) at 02/03/2024 0826 Last data filed at 02/03/2024 0331 Gross per 24 hour  Intake 878.48 ml  Output 925 ml  Net -46.52 ml    Current Heart Failure Medications:  Loop diuretic: none Beta-Blocker: none ACEI/ARB/ARNI: losartan  12.5 mg daily MRA: none SGLT2i: none Other: none  Prior to admission Heart Failure Medications:  Loop diuretic: none Beta-Blocker: none ACEI/ARB/ARNI: none MRA: none SGLT2i: none Other: none  Assessment: 1. 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 NYHA class III symptoms.  -Symptoms: Denies chest pain today. Reports mild orthopnea, fatigue, abdominal discomfort, and reduced appetite. Remains on 2L O2. -Volume: RHC yesterday showed low-normal filling pressures s/p fluid bolus. Now reports  some orthopnea and furosemide  20 mg PO daily started. -Hemodynamics: BP soft. HR 80s. -BB: Not currently on BB. Patient was not taking previously prescribed metoprolol . Can consider adding when BP improves. -ACEI/ARB/ARNI: Losartan  12.5 mg daily added, BP soft, will monitor for now. -MRA: Can consider adding tomorrow pending BP. -SGLT2i: Consider adding prior to discharge. Copay check pending.  Plan: 1) Medication changes recommended at this time: -None  2) Patient assistance: -Pending  3) Education: - Patient has been educated  on current HF medications and potential additions to HF medication regimen - Patient verbalizes understanding that over the next few months, these medication doses may change and more medications may be added to optimize HF regimen - Patient has been educated on basic disease state pathophysiology and goals of therapy  Medication Assistance / Insurance Benefits Check: Does the patient have prescription insurance?    Type of insurance plan:  Does the patient qualify for medication assistance through manufacturers or grants? Pending  Outpatient Pharmacy: Prior to admission outpatient pharmacy: [facility]      Please do not hesitate to reach out with questions or concerns,  [name], PharmD, CPP, BCPS, [facility] Heart Failure Pharmacist  Phone - [phone] 02/03/2024 11:43 AM

## 2024-02-03 NOTE — Progress Notes (Signed)
  Progress Note  Patient Name: Christopher Orr Date of Encounter: 02/03/2024 [facility] Cardiologist: Deatrice Cage, MD   Interval Summary    Patient converted to SR around 5:30 PM. He remains in NSR. He is overall doing well. He denies chest pain or SOB.   Vital Signs Vitals:   02/03/24 0230 02/03/24 0300 02/03/24 0500 02/03/24 0630  BP: (!) 100/57 (!) 103/45  (!) 104/50  Pulse: 71 71  72  Resp: 13 13  12   Temp:  97.8 F (36.6 C)    TempSrc:  Oral    SpO2: 100% 97%  100%  Weight:   99 kg   Height:        Intake/Output Summary (Last 24 hours) at 02/03/2024 0759 Last data filed at 02/03/2024 0331 Gross per 24 hour  Intake 878.48 ml  Output 925 ml  Net -46.52 ml      02/03/2024    5:00 AM 02/02/2024    5:00 AM 01/31/2024    4:52 PM  Last 3 Weights  Weight (lbs) 218 lb 4.1 oz 219 lb 2.2 oz 212 lb 4.9 oz  Weight (kg) 99 kg 99.4 kg 96.3 kg      Telemetry/ECG  Afib>NSR 70s - Personally Reviewed  Physical Exam  GEN: No acute distress.   Neck: No JVD Cardiac: RRR, no murmurs, rubs, or gallops.  Respiratory: Clear to auscultation bilaterally. GI: Soft, nontender, non-distended  MS: No edema    Patient Profile: Christopher Orr is a 78 y.o. male with a hx of single-vessel CAD with LAD PCI back in 2006-patent by catheterization in 2011 with chronic HFpEF, along with COPD, HTN HLD who is being seen 02/01/2024 for the evaluation of non-STEMI with ongoing chest pain and borderline hypotension  Assessment & Plan   New Onset Afib RVR - new Afib RVR with hypotension 7/8 in the AM started on IV heparin  and IV amio load+infusion with conversion to SR around 5:30 PM - reported SOB, no chest pain - CHADSAVASC (age x2, CHF, CAD) at least 4. Will require longterm a/c with Eliquis  - Keep Mag>2 and K>4. TSH wnl - echo showed LVEF 25-30%, mod LVH, mild MR - can convert IV amio to oral amio, 400mg  BID x 1 week, 200mg  BID x 1 week, followed by 200mg  daily   NSTEMI - HS troponin  peaked over 4000 - due to ongoing chest pain and hypotension patient was taken for urgent cardiac cath.  - right and left cardiac cath showed normal coronaries with minimal pLAD disease and widely patent mLAD stent.it was felt presentation consistent with pericarditis - continue Crestor  10mg  daily - no ASA given heparin    Acute pericarditis -started on colchicine  0.6mg BID and ibuprofen  800mg  TID x 3 months - ibuprofen  held for DOAC - no chest pain reported  HFrEF NICM - echo showed LVEF 25-30%, mod LVH, severe HK of left ventricular, mid-apical anteroseptal wall, anterior wall, apical segment and inferior wall, mild MR - he appears euvolemic - BP limiting GDMT  Hypotension - given IV bolus in the cath lab and IVF    COPD - per IM   HLD - LDL 32 - continue Crestor  10mg  daily  For questions or updates, please contact [facility] Please consult [email or web address] for contact info under       Signed, Tvisha Schwoerer VEAR Fishman, PA-C

## 2024-02-03 NOTE — Consult Note (Signed)
 PHARMACY - ANTICOAGULATION CONSULT NOTE  Pharmacy Consult for IV Heparin  Indication: atrial fibrillation  Patient Measurements: Height: 6' (182.9 cm) Weight: 99 kg (218 lb 4.1 oz) IBW/kg (Calculated) : 77.6 HEPARIN  DW (KG): 101.9  Labs: Recent Labs    01/31/24 1434 01/31/24 1550 01/31/24 1726 02/01/24 0017 02/01/24 0300 02/01/24 0414 02/01/24 1012 02/02/24 0315 02/02/24 1218 02/02/24 2033 02/03/24 0517  HGB 13.6  --   --    < > 10.9* 11.6*  --   --   --   --  11.0*  HCT 40.3  --   --    < > 32.0* 34.6*  --   --   --   --  32.8*  PLT 428*  --   --   --   --  349  --   --   --   --  296  APTT  --   --   --   --   --   --   --   --  143*  --   --   LABPROT  --  14.2  --   --   --   --   --   --   --   --   --   INR  --  1.0  --   --   --   --   --   --   --   --   --   HEPARINUNFRC  --   --   --    < >  --   --  <0.10*  --   --  0.35 0.50  CREATININE 1.25*  --   --   --   --  1.21  --  0.97  --   --   --   TROPONINIHS 4,107*  --  3,623*  --   --   --   --   --   --   --   --    < > = values in this interval not displayed.    Estimated Creatinine Clearance: 77.8 mL/min (by C-G formula based on SCr of 0.97 mg/dL).   Medical History: Past Medical History:  Diagnosis Date   Abnormal findings on diagnostic imaging of digestive system    Acute kidney injury (HCC) 07/28/2014   Alcohol  abuse    pt states it is marijuana   Anemia    Arthritis    Arthritis 08/15/2019   Aspiration pneumonia (HCC) 07/28/2014   Bilateral inguinal hernia, without obstruction or gangrene, recurrent    Bipolar disorder (HCC)    BPH (benign prostatic hypertrophy) with urinary obstruction    CAD (coronary artery disease)    a. 2006 PCI/DES to LAD, EF 60%.   Chest pain 07/28/2014   Chronic heart failure with preserved ejection fraction (HFpEF) (HCC)    a. 07/2014 Echo: EF 55%; b. 11/2020 Echo: EF 50-55%, mod LVH, GrI DD, mildly enlarged RV w/ nl fxn, mild BAE.   Chronic pancreatitis (HCC)     Chronic viral hepatitis C (HCC) 09/24/2014   Overview:   GT 1a.   started on Harvoni 11/03/2014 for planned 12 week course, elastography shows cirrhosis.   Cigarette smoker 08/05/2018   Cirrhosis (HCC)    Cirrhosis of liver (HCC) 08/15/2019   Noted in 2016 imaging with alcohol  abuse and h/o hep C tx s/p harvoni       COPD (chronic obstructive pulmonary disease) (HCC)    Coronary artery disease involving native heart with  angina pectoris (HCC) 05/31/2018   COVID-19    08/22/20   Dementia (HCC)    early dementia   Depression    Depression, recurrent (HCC) 06/14/2018   Dilated ascending aorta and aortic root (HCC)    a. 11/2020 Echo: Ao root 44mm, Asc Ao 40mm. Ao arch 37mm.   Diverticulitis    12/06/20 [facility]    Diverticulosis    Drug use    Dyspnea    ED (erectile dysfunction)    Elevated troponin I level 07/28/2014   Gastritis    GERD (gastroesophageal reflux disease)    Headache    occasional migraines   Hepatitis C    treated   Hernia, inguinal, bilateral 08/15/2019   Recurrent s/p repair       History of lumbar fusion    History of pancreatitis 08/15/2019   X 2    History of prostate cancer 08/15/2019   2017 s/p brachytherapy with seeds   F/u Dr. Penne       Hyperlipidemia    Hypertension    patient denies having high blood pressure   Internal hemorrhoids 01/29/2021   Leg edema 01/03/2022   Lung cancer (HCC)    Malignant neoplasm of lower lobe of left lung (HCC) 05/15/2020   Memory loss 08/15/2019   Nodule of lower lobe of left lung 11/09/2019   Nodule of middle lobe of right lung 08/15/2019   Other chronic pain 01/29/2021   Pancreatitis    x 2    Pneumonia    07/06/18    Polyp of transverse colon    Poor nutrition 06/09/2022   Premature atrial contractions    a. 04/2021 Zio: Freq PACs w/ 8% burden.   Prostate cancer [facility])    with seeds   PSVT (paroxysmal supraventricular tachycardia) (HCC)    a. 04/2021 Zio: Avg HR 80 (60-210). 42 SVT runs - longest 5 beats @  116, fastest 210 x 5 beats. Freq PACs (8%).   PTSD (post-traumatic stress disorder)    Rib fracture    s/p fall 03/15/19    Right ear pain 07/18/2021   S/P lumbar fusion 12/27/2020   Sinus bradycardia 01/29/2021   Sinus disease    SOB (shortness of breath) on exertion 05/15/2020   Squamous carcinoma of lung, left (HCC) 11/26/2019   Syncope 01/29/2021   Syncope due to orthostatic hypotension 01/23/2021   Umbilical hernia without obstruction and without gangrene    Urge incontinence    Viral upper respiratory tract infection 07/18/2021   Medications:  No anticoagulation prior to admission per my chart review. Takes Plavix   Assessment: Patient is a 78 y/o M admitted with NSTEMI secondary to suspected pericarditis. Now with new-onset Afib with RVR. Pharmacy consulted to manage heparin .  CBC notable for stable anemia. INR normal. aPTT ordered and pending  Goal of Therapy:  Heparin  level 0.3-0.7 units/ml Monitor platelets by anticoagulation protocol: Yes  7/08 @ 2033: HL 0.35  Therapeutic x 1 7/09 0517 HL 0.50, therapeutic x 2   Plan:  --Continue heparin  infusion at 1500 units/hr --Recheck HL daily w/ AM labs while therapeutic --Daily CBC per protocol while on IV heparin   Rankin CANDIE Dills, PharmD, [facility] 02/03/2024 5:52 AM

## 2024-02-03 NOTE — Plan of Care (Signed)
  Problem: Activity: Goal: Ability to tolerate increased activity will improve Outcome: Progressing   Problem: Cardiac: Goal: Ability to achieve and maintain adequate cardiovascular perfusion will improve Outcome: Progressing   Problem: Education: Goal: Knowledge of General Education information will improve Description: Including pain rating scale, medication(s)/side effects and non-pharmacologic comfort measures Outcome: Progressing   Problem: Clinical Measurements: Goal: Cardiovascular complication will be avoided Outcome: Progressing   Problem: Activity: Goal: Risk for activity intolerance will decrease Outcome: Progressing   Problem: Nutrition: Goal: Adequate nutrition will be maintained Outcome: Progressing   Problem: Pain Managment: Goal: General experience of comfort will improve and/or be controlled Outcome: Progressing   Problem: Safety: Goal: Ability to remain free from injury will improve Outcome: Progressing

## 2024-02-03 NOTE — Progress Notes (Signed)
 Occupational Therapy Treatment Patient Details Name: Christopher Orr MRN: [id number] DOB: [birth year] Today's Date: 02/03/2024   History of present illness Patient is a 78 year old male with chest pain, initial presentation consistent with non-STEMI. S/p urgent cardiac catheterization. History of COPD, CAD s/p PCI in 2006, HTN, HLD.   OT comments  [name] seen for OT treatment on this date. Upon arrival to room pt in bed, agreeable to tx. Pt requires SUPERVISION for bed mobility; SBA for STS. Pt ambulated ~160 ft without AD use and CGA; experienced one minor LOB but self-corrected and required 2 seated rest breaks during ambulation. Pt desats to 85% on RA, pt reports feeling lightheaded. SpO2 returns quickly to > 90% w/ 2 L O2 and seated rest break.  Pt making good progress toward goals, will continue to follow POC. Discharge recommendation remains appropriate.        If plan is discharge home, recommend the following:  A little help with walking and/or transfers;A little help with bathing/dressing/bathroom   Equipment Recommendations  BSC/3in1    Recommendations for Other Services      Precautions / Restrictions         Mobility Bed Mobility Overal bed mobility: Needs Assistance Bed Mobility: Supine to Sit     Supine to sit: Supervision          Transfers Overall transfer level: Needs assistance Equipment used: None Transfers: Sit to/from Stand Sit to Stand: Supervision (SBA)                 Balance Overall balance assessment: Needs assistance Sitting-balance support: Feet supported Sitting balance-Leahy Scale: Good     Standing balance support: No upper extremity supported Standing balance-Leahy Scale: Fair                             ADL either performed or assessed with clinical judgement   ADL                                              Extremity/Trunk Assessment Upper Extremity Assessment Upper Extremity  Assessment: Generalized weakness   Lower Extremity Assessment Lower Extremity Assessment: Generalized weakness        Vision       Perception     Praxis     Communication Communication Communication: No apparent difficulties   Cognition Arousal: Alert Behavior During Therapy: WFL for tasks assessed/performed Cognition: No apparent impairments                               Following commands: Intact        Cueing   Cueing Techniques: Verbal cues, Gestural cues  Exercises      Shoulder Instructions       General Comments SpO2 85% on RA w/ activity    Pertinent Vitals/ Pain       Pain Assessment Pain Assessment: No/denies pain  Home Living                                          Prior Functioning/Environment              Frequency  Min 2X/week  Progress Toward Goals  OT Goals(current goals can now be found in the care plan section)  Progress towards OT goals: Progressing toward goals  Acute Rehab OT Goals Patient Stated Goal: to go home OT Goal Formulation: With patient Time For Goal Achievement: 02/17/24 Potential to Achieve Goals: Good ADL Goals Pt Will Perform Grooming: with modified independence;standing Pt Will Perform Lower Body Dressing: with modified independence;sit to/from stand Pt Will Transfer to Toilet: with modified independence;ambulating;regular height toilet  Plan      Co-evaluation    PT/OT/SLP Co-Evaluation/Treatment: Yes Reason for Co-Treatment: For patient/therapist safety PT goals addressed during session: Mobility/safety with mobility OT goals addressed during session: Proper use of Adaptive equipment and DME      AM-PAC OT 6 Clicks Daily Activity     Outcome Measure   Help from another person eating meals?: None Help from another person taking care of personal grooming?: A Little Help from another person toileting, which includes using toliet, bedpan, or urinal?: A  Little Help from another person bathing (including washing, rinsing, drying)?: A Little Help from another person to put on and taking off regular upper body clothing?: None Help from another person to put on and taking off regular lower body clothing?: A Little 6 Click Score: 20    End of Session Equipment Utilized During Treatment: Gait belt  OT Visit Diagnosis: Other abnormalities of gait and mobility (R26.89);Muscle weakness (generalized) (M62.81)   Activity Tolerance Patient tolerated treatment well   Patient Left in chair;Other (comment) (w/ PT)   Nurse Communication          Time: 8870-8849 OT Time Calculation (min): 21 min  Charges: OT General Charges $OT Visit: 1 Visit OT Treatments $Therapeutic Activity: 8-22 mins  [place], Student OT   [employer] 02/03/2024, 1:34 PM

## 2024-02-03 NOTE — Care Management Important Message (Signed)
 Important Message  Patient Details  Name: Christopher Orr MRN: [id number] Date of Birth: 08-Nov-1945   Important Message Given:  Yes - Medicare IM     [name] 02/03/2024, 5:17 PM

## 2024-02-03 NOTE — Progress Notes (Signed)
 Physical Therapy Treatment Patient Details Name: Christopher Orr MRN: [id number] DOB: 04-12-1946 Today's Date: 02/03/2024   History of Present Illness Patient is a 78 year old male with chest pain, initial presentation consistent with non-STEMI. S/p urgent cardiac catheterization. History of COPD, CAD s/p PCI in 2006, HTN, HLD.    PT Comments  Patient alert, up in with OT in room. On 2L for first part and last part of ambulation, trialed on room air. Questionable pleth, but reading may have been down to 86. Pt did report feeling light headed, resolved when placed back on 2L. He was able to ambulate ~196ft total, two seated rest breaks, no AD, CGA (2+ for lines/leads). Pt very motivated to return home. The patient would benefit from further skilled PT intervention to continue to progress towards goals.    If plan is discharge home, recommend the following: Assistance with cooking/housework;Assist for transportation   Can travel by private vehicle        Equipment Recommendations  None recommended by PT    Recommendations for Other Services       Precautions / Restrictions Precautions Precautions: Fall Recall of Precautions/Restrictions: Intact Restrictions Weight Bearing Restrictions Per Provider Order: No     Mobility  Bed Mobility               General bed mobility comments: pt up with OT upon PT arrival    Transfers Overall transfer level: Needs assistance Equipment used: None Transfers: Sit to/from Stand Sit to Stand: Contact guard assist, Supervision                Ambulation/Gait Ambulation/Gait assistance: Contact guard assist Gait Distance (Feet): 180 Feet Assistive device: None   Gait velocity: decreased     General Gait Details: two seated rest breaks. trialed on room air. unclear pleth but pt reported feeling lightheaded on room air, improved on 2L. wide base of support, no true LOB but occasional staggered step noted   Stairs              Wheelchair Mobility     Tilt Bed    Modified Rankin (Stroke Patients Only)       Balance Overall balance assessment: Needs assistance Sitting-balance support: Feet supported Sitting balance-Leahy Scale: Fair       Standing balance-Leahy Scale: Good                              Communication    Cognition Arousal: Alert Behavior During Therapy: WFL for tasks assessed/performed   PT - Cognitive impairments: No apparent impairments                         Following commands: Intact      Cueing Cueing Techniques: Verbal cues  Exercises      General Comments        Pertinent Vitals/Pain Pain Assessment Pain Assessment: Faces Faces Pain Scale: No hurt    Home Living                          Prior Function            PT Goals (current goals can now be found in the care plan section) Progress towards PT goals: Progressing toward goals    Frequency    Min 2X/week      PT Plan  Co-evaluation PT/OT/SLP Co-Evaluation/Treatment: Yes Reason for Co-Treatment: Complexity of the patient's impairments (multi-system involvement) PT goals addressed during session: Mobility/safety with mobility OT goals addressed during session: Proper use of Adaptive equipment and DME      AM-PAC PT 6 Clicks Mobility   Outcome Measure  Help needed turning from your back to your side while in a flat bed without using bedrails?: None Help needed moving from lying on your back to sitting on the side of a flat bed without using bedrails?: None Help needed moving to and from a bed to a chair (including a wheelchair)?: None Help needed standing up from a chair using your arms (e.g., wheelchair or bedside chair)?: None Help needed to walk in hospital room?: A Little Help needed climbing 3-5 steps with a railing? : A Little 6 Click Score: 22    End of Session Equipment Utilized During Treatment: Gait belt Activity Tolerance: Patient  tolerated treatment well Patient left: in chair;with call bell/phone within reach Nurse Communication: Mobility status PT Visit Diagnosis: Muscle weakness (generalized) (M62.81);Unsteadiness on feet (R26.81)     Time: 8859-8847 PT Time Calculation (min) (ACUTE ONLY): 12 min  Charges:    $Therapeutic Activity: 8-22 mins PT General Charges $$ ACUTE PT VISIT: 1 Visit                    Doyal Shams PT, DPT 1:27 PM,02/03/24

## 2024-02-03 NOTE — Telephone Encounter (Signed)
 Patient Product/process development scientist completed.    The patient is insured through [facility]. Patient has Medicare and is not eligible for a copay card, but may be able to apply for patient assistance or Medicare RX Payment Plan (Patient Must reach out to their plan, if eligible for payment plan), if available.    Ran test claim for Entresto 24-26 mg and the current 30 day co-pay is $0.00.  Ran test claim for Farxiga 10 mg and the current 30 day co-pay is $0.00.  Ran test claim for Jardiance 10 mg and the current 30 day co-pay is $0.00.    This test claim was processed through [facility]- copay amounts may vary at other pharmacies due to pharmacy/plan contracts, or as the patient moves through the different stages of their insurance plan.     Roland Earl, CPHT Pharmacy Technician III Certified Patient Advocate [facility] Patient Advocate Team Direct Number: [phone]  Fax: [phone]

## 2024-02-03 NOTE — Progress Notes (Addendum)
 PROGRESS NOTE    Christopher Orr  FMW:[id number] DOB: Dec 04, 1945 DOA: 01/31/2024 PCP: System, Provider Not In     Brief Narrative:   From admission h and p   Christopher Orr is a 78 y.o. male with medical history significant of COPD, CAD s/p PCI in 2006, HTN, and HLD. He presented to [facility] ED complaining of chest pain that was left sided and midsternal. He states that he has been short of breath with it, but he denies diaphoresis or nausea. It was not exertional. EKG did not demonstrate any new ischemic changes, but his troponin was elevated at 4107.    Cardiology was contacted by the EDP. The patient is on a heparin  gtt. He will be NPO after midnight. Plan is for [facility] tomorrow.  Assessment & Plan:   Principal Problem:   NSTEMI (non-ST elevated myocardial infarction) (HCC) Active Problems:   COPD (chronic obstructive pulmonary disease) (HCC)   Hyperlipidemia   CAD (coronary artery disease)   Tobacco abuse   Lung cancer (HCC)   Alcohol  abuse   Bipolar disorder (HCC)   Stage 3a chronic kidney disease (HCC)   Dementia without behavioral disturbance (HCC)   Hypotension   Malnutrition of moderate degree   Atrial fibrillation with rapid ventricular response (HCC)   Acute pericarditis   Myopericarditis   Nonischemic cardiomyopathy (HCC)  # Chest pain # Myopericarditis # CAD # HFrEF Several days constant chest pain worse with exertion, trop elevated to 4000s. History DES to LAD in 2006. Started on IV heparin , taken urgently to cath which showed non-obstructive CAD, no culprit lesion. Cardiology thinks this is myopericarditis, possible stress induced cardiomyopathy. EF 25-30. Respiratory viral panel negative. - colchicine  per cardiology, will continue ppi - maintain tele - apixaban  as below - cont home statin  # A-fib rvr New 7/8 with relative hypotension. New diagnosis, possibly triggered by above pericarditis. Controlled w/ amio but soft BPs. TSH wnl - amio, dig per  cardiology - apixaban  - will need outpt sleep study  # Bipolar - cont home abilify , fluoxetine , trazodone   # COPD Doesn't appear exacerbated. O2 is hovering in the low 90s to upper 80s. Patient says he's previously been told he needs home oxygen  but isn't on it. Prior wedge resection as below. - d/c o2 and see if he actually needs it  # History lung cancer Nothing acute seen on CXR. Prior wedge resection - outpt onc f/u  # Dementia No behavioral disturbance - cont home donepezil , memantine   # OAB, BPH Bladder scan 7/7 wnl - home mirabegron , flomax   # Debility PT/OT advising home health   DVT prophylaxis: lovenox  Code Status: full Family Communication: daughter updated telephonically 7/9  Level of care: Telemetry Cardiac Status is: Inpatient Remains inpatient appropriate because: severity of illness    Consultants:  cardiology  Procedures: LHC  Antimicrobials:  none    Subjective: Chest pain resolved, no dyspnea or palpitations, wants to go   Objective: Vitals:   02/03/24 1300 02/03/24 1330 02/03/24 1400 02/03/24 1430  BP: (!) 96/58 99/63  93/62  Pulse: 81 88 (!) 105 91  Resp: 18 19 17 20   Temp:      TempSrc:      SpO2: 99% 99% 97% 97%  Weight:      Height:        Intake/Output Summary (Last 24 hours) at 02/03/2024 1510 Last data filed at 02/03/2024 1200 Gross per 24 hour  Intake 344.79 ml  Output 1025 ml  Net -680.21  ml   Filed Weights   01/31/24 1652 02/02/24 0500 02/03/24 0500  Weight: 96.3 kg 99.4 kg 99 kg    Examination:  General exam: Appears calm and comfortable  Respiratory system: Clear to auscultation. Respiratory effort normal. Cardiovascular system: tachycardic irregular Gastrointestinal system: Abdomen is nondistended, soft and nontender.   Central nervous system: Alert and oriented. No focal neurological deficits. Extremities: Symmetric 5 x 5 power. No lower extremity edema Skin: No rashes, lesions or ulcers Psychiatry:  Judgement and insight appear normal. Mood & affect appropriate.     Data Reviewed: I have personally reviewed following labs and imaging studies  CBC: Recent Labs  Lab 01/31/24 1434 02/01/24 0249 02/01/24 0255 02/01/24 0300 02/01/24 0414 02/03/24 0517  WBC 16.7*  --   --   --  15.2* 14.8*  HGB 13.6 10.9* 11.2*  10.9* 10.9* 11.6* 11.0*  HCT 40.3 32.0* 33.0*  32.0* 32.0* 34.6* 32.8*  MCV 93.9  --   --   --  94.0 92.9  PLT 428*  --   --   --  349 296   Basic Metabolic Panel: Recent Labs  Lab 01/31/24 1434 02/01/24 0249 02/01/24 0255 02/01/24 0300 02/01/24 0414 02/02/24 0315 02/03/24 0517  NA 138   < > 127*  137 137 136 136 137  K 4.2   < > 3.5  3.7 3.8 4.1 3.7 4.1  CL 103  --   --   --  104 105 109  CO2 26  --   --   --  23 22 21*  GLUCOSE 136*  --   --   --  134* 122* 100*  BUN 13  --   --   --  16 17 17   CREATININE 1.25*  --   --   --  1.21 0.97 1.10  CALCIUM  9.4  --   --   --  8.6* 8.5* 8.5*  MG  --   --   --   --   --  2.2 2.2  PHOS  --   --   --   --   --  4.0 3.8   < > = values in this interval not displayed.   GFR: Estimated Creatinine Clearance: 68.6 mL/min (by C-G formula based on SCr of 1.1 mg/dL). Liver Function Tests: No results for input(s): AST, ALT, ALKPHOS, BILITOT, PROT, ALBUMIN in the last 168 hours. No results for input(s): LIPASE, AMYLASE in the last 168 hours. No results for input(s): AMMONIA in the last 168 hours. Coagulation Profile: Recent Labs  Lab 01/31/24 1550  INR 1.0   Cardiac Enzymes: No results for input(s): CKTOTAL, CKMB, CKMBINDEX, TROPONINI in the last 168 hours. BNP (last 3 results) No results for input(s): PROBNP in the last 8760 hours. HbA1C: No results for input(s): HGBA1C in the last 72 hours. CBG: Recent Labs  Lab 01/31/24 1650  GLUCAP 113*   Lipid Profile: Recent Labs    02/01/24 0414  CHOL 78  HDL 37*  LDLCALC 32  TRIG 43  CHOLHDL 2.1   Thyroid  Function Tests: Recent  Labs    02/02/24 0315  TSH 0.620   Anemia Panel: No results for input(s): VITAMINB12, FOLATE, FERRITIN, TIBC, IRON, RETICCTPCT in the last 72 hours. Urine analysis:    Component Value Date/Time   COLORURINE YELLOW (A) 02/19/2023 1850   APPEARANCEUR CLEAR (A) 02/19/2023 1850   APPEARANCEUR Clear 07/15/2022 1448   LABSPEC 1.011 02/19/2023 1850   PHURINE 5.0 02/19/2023 1850   GLUCOSEU  NEGATIVE 02/19/2023 1850   HGBUR NEGATIVE 02/19/2023 1850   BILIRUBINUR NEGATIVE 02/19/2023 1850   BILIRUBINUR Negative 07/15/2022 1448   KETONESUR NEGATIVE 02/19/2023 1850   PROTEINUR NEGATIVE 02/19/2023 1850   UROBILINOGEN 0.2 07/27/2014 0236   NITRITE NEGATIVE 02/19/2023 1850   LEUKOCYTESUR NEGATIVE 02/19/2023 1850   Sepsis Labs: @LABRCNTIP (procalcitonin:4,lacticidven:4)  ) Recent Results (from the past 240 hours)  MRSA Next Gen by PCR, Nasal     Status: None   Collection Time: 01/31/24  7:22 PM   Specimen: Nasal Mucosa; Nasal Swab  Result Value Ref Range Status   MRSA by PCR Next Gen NOT DETECTED NOT DETECTED Final    Comment: (NOTE) The GeneXpert MRSA Assay (FDA approved for NASAL specimens only), is one component of a comprehensive MRSA colonization surveillance program. It is not intended to diagnose MRSA infection nor to guide or monitor treatment for MRSA infections. Test performance is not FDA approved in patients less than 26 years old. Performed at [facility], [address] [place], [place] [address]   SARS Coronavirus 2 by RT PCR (hospital order, performed in [facility] hospital lab) *cepheid single result test* Anterior Nasal Swab     Status: None   Collection Time: 02/01/24 12:03 PM   Specimen: Anterior Nasal Swab  Result Value Ref Range Status   SARS Coronavirus 2 by RT PCR NEGATIVE NEGATIVE Final    Comment: (NOTE) SARS-CoV-2 target nucleic acids are NOT DETECTED.  The SARS-CoV-2 RNA is generally detectable in upper and lower respiratory  specimens during the acute phase of infection. The lowest concentration of SARS-CoV-2 viral copies this assay can detect is 250 copies / mL. A negative result does not preclude SARS-CoV-2 infection and should not be used as the sole basis for treatment or other patient management decisions.  A negative result may occur with improper specimen collection / handling, submission of specimen other than nasopharyngeal swab, presence of viral mutation(s) within the areas targeted by this assay, and inadequate number of viral copies (<250 copies / mL). A negative result must be combined with clinical observations, patient history, and epidemiological information.  Fact Sheet for Patients:   RoadLapTop.co.za  Fact Sheet for Healthcare Providers: http://kim-miller.com/  This test is not yet approved or  cleared by the [place]  FDA and has been authorized for detection and/or diagnosis of SARS-CoV-2 by FDA under an Emergency Use Authorization (EUA).  This EUA will remain in effect (meaning this test can be used) for the duration of the COVID-19 declaration under Section 564(b)(1) of the Act, 21 U.S.C. section 360bbb-3(b)(1), unless the authorization is terminated or revoked sooner.  Performed at [facility], [address] [place], [place] [address]   Respiratory (~20 pathogens) panel by PCR     Status: None   Collection Time: 02/01/24 12:03 PM   Specimen: Nasopharyngeal Swab; Respiratory  Result Value Ref Range Status   Adenovirus NOT DETECTED NOT DETECTED Final   Coronavirus 229E NOT DETECTED NOT DETECTED Final    Comment: (NOTE) The Coronavirus on the Respiratory Panel, DOES NOT test for the novel  Coronavirus (2019 nCoV)    Coronavirus HKU1 NOT DETECTED NOT DETECTED Final   Coronavirus NL63 NOT DETECTED NOT DETECTED Final   Coronavirus OC43 NOT DETECTED NOT DETECTED Final   Metapneumovirus NOT DETECTED NOT DETECTED Final    Rhinovirus / Enterovirus NOT DETECTED NOT DETECTED Final   Influenza A NOT DETECTED NOT DETECTED Final   Influenza B NOT DETECTED NOT DETECTED Final  Parainfluenza Virus 1 NOT DETECTED NOT DETECTED Final   Parainfluenza Virus 2 NOT DETECTED NOT DETECTED Final   Parainfluenza Virus 3 NOT DETECTED NOT DETECTED Final   Parainfluenza Virus 4 NOT DETECTED NOT DETECTED Final   Respiratory Syncytial Virus NOT DETECTED NOT DETECTED Final   Bordetella pertussis NOT DETECTED NOT DETECTED Final   Bordetella Parapertussis NOT DETECTED NOT DETECTED Final   Chlamydophila pneumoniae NOT DETECTED NOT DETECTED Final   Mycoplasma pneumoniae NOT DETECTED NOT DETECTED Final    Comment: Performed at [facility], [address] [place], [place] [address]         Radiology Studies: ECHOCARDIOGRAM COMPLETE Result Date: 02/02/2024    ECHOCARDIOGRAM REPORT   Patient Name:   Christopher Orr Date of Exam: 02/02/2024 Medical Rec #:  [id number]       Height:       72.0 in Accession #:    [id number]      Weight:       219.1 lb Date of Birth:  1945/12/24       BSA:          2.215 m Patient Age:    78 years        BP:           89/68 mmHg Patient Gender: M               HR:           124 bpm. Exam Location:  [facility] Procedure: 2D Echo, Cardiac Doppler and Color Doppler (Both Spectral and Color            Flow Doppler were utilized during procedure). Indications:     Pericarditis  History:         Patient has prior history of Echocardiogram examinations, most                  recent 10/14/2021. CAD; Signs/Symptoms:Chest Pain.  Sonographer:     Christopher Furnace Referring Phys:  [id number] Jennessa Trigo Christopher Orr Sydna Brodowski Diagnosing Phys: Deatrice Cage MD IMPRESSIONS  1. Left ventricular ejection fraction, by estimation, is 25 to 30%. The left ventricle has severely decreased function. The left ventricle demonstrates regional wall motion abnormalities (see scoring diagram/findings for description). There is moderate left ventricular hypertrophy. Left  ventricular diastolic parameters are indeterminate. There is severe hypokinesis of the left ventricular, mid-apical anteroseptal wall, anterior wall, apical segment and inferior wall.  2. Right ventricular systolic function is normal. The right ventricular size is normal. Tricuspid regurgitation signal is inadequate for assessing PA pressure.  3. Left atrial size was mildly dilated.  4. Right atrial size was mildly dilated.  5. The mitral valve is normal in structure. Mild mitral valve regurgitation. No evidence of mitral stenosis.  6. The aortic valve is normal in structure. Aortic valve regurgitation is not visualized. No aortic stenosis is present.  7. Aortic dilatation noted. There is mild dilatation of the ascending aorta, measuring 42 mm. Comparison(s): A prior study was performed on 09/2021. Changes from prior study are noted. The left ventricular function is significantly worse. FINDINGS  Left Ventricle: Left ventricular ejection fraction, by estimation, is 25 to 30%. The left ventricle has severely decreased function. The left ventricle demonstrates regional wall motion abnormalities. Severe hypokinesis of the left ventricular, mid-apical anteroseptal wall, anterior wall, apical segment and inferior wall. The left ventricular internal cavity size was normal in size. There is moderate left ventricular hypertrophy. Left ventricular diastolic parameters are indeterminate. Right Ventricle:  The right ventricular size is normal. No increase in right ventricular wall thickness. Right ventricular systolic function is normal. Tricuspid regurgitation signal is inadequate for assessing PA pressure. Left Atrium: Left atrial size was mildly dilated. Right Atrium: Right atrial size was mildly dilated. Pericardium: There is no evidence of pericardial effusion. Mitral Valve: The mitral valve is normal in structure. Mild mitral valve regurgitation. No evidence of mitral valve stenosis. Tricuspid Valve: The tricuspid valve  is normal in structure. Tricuspid valve regurgitation is trivial. No evidence of tricuspid stenosis. Aortic Valve: The aortic valve is normal in structure. Aortic valve regurgitation is not visualized. No aortic stenosis is present. Aortic valve mean gradient measures 2.0 mmHg. Aortic valve peak gradient measures 3.5 mmHg. Aortic valve area, by VTI measures 4.51 cm. Pulmonic Valve: The pulmonic valve was normal in structure. Pulmonic valve regurgitation is not visualized. No evidence of pulmonic stenosis. Aorta: The aortic root is normal in size and structure and aortic dilatation noted. There is mild dilatation of the ascending aorta, measuring 42 mm. Venous: The inferior vena cava was not well visualized. IAS/Shunts: No atrial level shunt detected by color flow Doppler.  LEFT VENTRICLE PLAX 2D LVIDd:         5.40 cm LVIDs:         3.80 cm LV PW:         1.20 cm LV IVS:        1.70 cm LVOT diam:     2.30 cm LV SV:         63 LV SV Index:   28 LVOT Area:     4.15 cm  LV Volumes (MOD) LV vol d, MOD A2C: 176.0 ml LV vol d, MOD A4C: 171.0 ml LV vol s, MOD A2C: 120.0 ml LV vol s, MOD A4C: 140.0 ml LV SV MOD A2C:     56.0 ml LV SV MOD A4C:     171.0 ml LV SV MOD BP:      46.0 ml RIGHT VENTRICLE RV Basal diam:  5.20 cm RV Mid diam:    4.60 cm RV S prime:     9.14 cm/s TAPSE (M-mode): 2.0 cm LEFT ATRIUM             Index        RIGHT ATRIUM           Index LA Vol (A2C):   50.2 ml 22.66 ml/m  RA Area:     21.90 cm LA Vol (A4C):   70.4 ml 31.78 ml/m  RA Volume:   68.80 ml  31.06 ml/m LA Biplane Vol: 65.0 ml 29.35 ml/m  AORTIC VALVE AV Area (Vmax):    4.04 cm AV Area (Vmean):   3.28 cm AV Area (VTI):     4.51 cm AV Vmax:           93.30 cm/s AV Vmean:          73.000 cm/s AV VTI:            0.139 m AV Peak Grad:      3.5 mmHg AV Mean Grad:      2.0 mmHg LVOT Vmax:         90.70 cm/s LVOT Vmean:        57.700 cm/s LVOT VTI:          0.151 m LVOT/AV VTI ratio: 1.09  AORTA Ao Root diam: 4.40 cm MITRAL VALVE  TRICUSPID VALVE MV Area (PHT): 6.65 cm    TR Peak grad:   22.1 mmHg MV Decel Time: 114 msec    TR Vmax:        235.00 cm/s MV E velocity: 99.80 cm/s                            SHUNTS                            Systemic VTI:  0.15 m                            Systemic Diam: 2.30 cm Deatrice Cage MD Electronically signed by Deatrice Cage MD Signature Date/Time: 02/02/2024/5:09:13 PM    Final         Scheduled Meds:  amiodarone   400 mg Oral BID   apixaban   5 mg Oral BID   ARIPiprazole   10 mg Oral QHS   Chlorhexidine  Gluconate Cloth  6 each Topical Daily   colchicine   0.6 mg Oral BID   digoxin   0.125 mg Oral Daily   donepezil   5 mg Oral QHS   feeding supplement  237 mL Oral TID BM   FLUoxetine   30 mg Oral Daily   folic acid   1 mg Oral Daily   furosemide   20 mg Oral Daily   losartan   12.5 mg Oral Daily   melatonin  2.5 mg Oral QHS   memantine   10 mg Oral BID   mirabegron  ER  50 mg Oral Daily   multivitamin with minerals  1 tablet Oral Daily   nicotine   21 mg Transdermal Daily   pantoprazole   40 mg Oral Daily   [START ON 02/04/2024] potassium chloride   10 mEq Oral Once   rosuvastatin   10 mg Oral QHS   sodium chloride  flush  3 mL Intravenous Q12H   tamsulosin   0.4 mg Oral Daily   thiamine   100 mg Oral Daily   Continuous Infusions:     LOS: 3 days     Devaughn KATHEE Ban, MD [facility]   If 7PM-7AM, please contact night-coverage [email or web address] Password [email or web address] 02/03/2024, 3:10 PM

## 2024-02-04 ENCOUNTER — Other Ambulatory Visit: Payer: Self-pay

## 2024-02-04 ENCOUNTER — Encounter: Payer: Self-pay | Admitting: Internal Medicine

## 2024-02-04 DIAGNOSIS — I4891 Unspecified atrial fibrillation: Secondary | ICD-10-CM | POA: Diagnosis not present

## 2024-02-04 DIAGNOSIS — F1010 Alcohol abuse, uncomplicated: Secondary | ICD-10-CM

## 2024-02-04 DIAGNOSIS — I959 Hypotension, unspecified: Secondary | ICD-10-CM

## 2024-02-04 DIAGNOSIS — I214 Non-ST elevation (NSTEMI) myocardial infarction: Secondary | ICD-10-CM | POA: Diagnosis not present

## 2024-02-04 DIAGNOSIS — I319 Disease of pericardium, unspecified: Secondary | ICD-10-CM | POA: Diagnosis not present

## 2024-02-04 DIAGNOSIS — I429 Cardiomyopathy, unspecified: Secondary | ICD-10-CM | POA: Diagnosis not present

## 2024-02-04 LAB — CBC
HCT: 29.8 % — ABNORMAL LOW (ref 39.0–52.0)
Hemoglobin: 10.1 g/dL — ABNORMAL LOW (ref 13.0–17.0)
MCH: 31.3 pg (ref 26.0–34.0)
MCHC: 33.9 g/dL (ref 30.0–36.0)
MCV: 92.3 fL (ref 80.0–100.0)
Platelets: 305 K/uL (ref 150–400)
RBC: 3.23 MIL/uL — ABNORMAL LOW (ref 4.22–5.81)
RDW: 14.4 % (ref 11.5–15.5)
WBC: 9.2 K/uL (ref 4.0–10.5)
nRBC: 0 % (ref 0.0–0.2)

## 2024-02-04 LAB — BASIC METABOLIC PANEL WITH GFR
Anion gap: 9 (ref 5–15)
BUN: 17 mg/dL (ref 8–23)
CO2: 20 mmol/L — ABNORMAL LOW (ref 22–32)
Calcium: 8.3 mg/dL — ABNORMAL LOW (ref 8.9–10.3)
Chloride: 109 mmol/L (ref 98–111)
Creatinine, Ser: 1.21 mg/dL (ref 0.61–1.24)
GFR, Estimated: >60 mL/min (ref >60)
Glucose, Bld: 133 mg/dL — ABNORMAL HIGH (ref 70–99)
Potassium: 3.8 mmol/L (ref 3.5–5.1)
Sodium: 138 mmol/L (ref 135–145)

## 2024-02-04 LAB — PHOSPHORUS: Phosphorus: 2.5 mg/dL (ref 2.5–4.6)

## 2024-02-04 LAB — MAGNESIUM: Magnesium: 2.1 mg/dL (ref 1.7–2.4)

## 2024-02-04 LAB — HEPARIN LEVEL (UNFRACTIONATED): Heparin Unfractionated: >1.1 [IU]/mL — ABNORMAL HIGH (ref 0.30–0.70)

## 2024-02-04 MED ORDER — DAPAGLIFLOZIN PROPANEDIOL 10 MG PO TABS
10.0000 mg | ORAL_TABLET | Freq: Every day | ORAL | 1 refills | Status: DC
  Filled 2024-02-04: qty 30, 30d supply, fill #0

## 2024-02-04 MED ORDER — DAPAGLIFLOZIN PROPANEDIOL 10 MG PO TABS
10.0000 mg | ORAL_TABLET | Freq: Every day | ORAL | Status: DC
  Administered 2024-02-04: 10 mg via ORAL
  Filled 2024-02-04: qty 1

## 2024-02-04 MED ORDER — DIGOXIN 125 MCG PO TABS
0.1250 mg | ORAL_TABLET | Freq: Every day | ORAL | 1 refills | Status: DC
Start: 2024-02-05 — End: 2024-03-14
  Filled 2024-02-04: qty 30, 30d supply, fill #0

## 2024-02-04 MED ORDER — MIDODRINE HCL 5 MG PO TABS
5.0000 mg | ORAL_TABLET | Freq: Three times a day (TID) | ORAL | Status: DC
  Administered 2024-02-04: 5 mg via ORAL
  Filled 2024-02-04: qty 1

## 2024-02-04 MED ORDER — AMIODARONE HCL 200 MG PO TABS
ORAL_TABLET | ORAL | 1 refills | Status: AC
  Filled 2024-02-04: qty 60, 50d supply, fill #0

## 2024-02-04 MED ORDER — LOSARTAN POTASSIUM 25 MG PO TABS
12.5000 mg | ORAL_TABLET | Freq: Every day | ORAL | 1 refills | Status: DC
  Filled 2024-02-04: qty 30, 60d supply, fill #0

## 2024-02-04 MED ORDER — COLCHICINE 0.6 MG PO TABS
0.6000 mg | ORAL_TABLET | Freq: Two times a day (BID) | ORAL | 1 refills | Status: DC
  Filled 2024-02-04: qty 18, 9d supply, fill #0

## 2024-02-04 MED ORDER — APIXABAN 5 MG PO TABS
5.0000 mg | ORAL_TABLET | Freq: Two times a day (BID) | ORAL | 1 refills | Status: DC
  Filled 2024-02-04: qty 60, 30d supply, fill #0

## 2024-02-04 MED ORDER — FUROSEMIDE 20 MG PO TABS
20.0000 mg | ORAL_TABLET | Freq: Every day | ORAL | 1 refills | Status: DC
  Filled 2024-02-04: qty 30, 30d supply, fill #0

## 2024-02-04 MED ORDER — MIDODRINE HCL 5 MG PO TABS
5.0000 mg | ORAL_TABLET | Freq: Three times a day (TID) | ORAL | 1 refills | Status: DC
  Filled 2024-02-04: qty 90, 30d supply, fill #0

## 2024-02-04 NOTE — Progress Notes (Signed)
 Progress Note  Patient Name: Christopher Orr Date of Encounter: 02/04/2024 [facility] Cardiologist: Deatrice Cage, MD   Interval Summary    Patient had recurrent Afib this AM 6AM-8AM, he is back in NSR. Bps low this AM. He denies chest pain or SOB. He denies dizziness.  Vital Signs Vitals:   02/04/24 0100 02/04/24 0200 02/04/24 0206 02/04/24 0500  BP:   (!) 90/48 93/60  Pulse: 85 86 84 82  Resp: 18 12 14    Temp:      TempSrc:      SpO2: 91% 93% 92% 91%  Weight:    98.6 kg  Height:        Intake/Output Summary (Last 24 hours) at 02/04/2024 1049 Last data filed at 02/04/2024 0900 Gross per 24 hour  Intake 360 ml  Output 1775 ml  Net -1415 ml      02/04/2024    5:00 AM 02/03/2024    5:00 AM 02/02/2024    5:00 AM  Last 3 Weights  Weight (lbs) 217 lb 6 oz 218 lb 4.1 oz 219 lb 2.2 oz  Weight (kg) 98.6 kg 99 kg 99.4 kg      Telemetry/ECG  NSR>Afib 6AM-8AM>NSR HR 80-90s - Personally Reviewed  Physical Exam  GEN: No acute distress.   Neck: No JVD Cardiac: RRR, no murmurs, rubs, or gallops.  Respiratory: Clear to auscultation bilaterally. GI: Soft, nontender, non-distended  MS: No edema  Assessment & Plan    Patient Profile: Christopher Orr is a 78 y.o. male with a hx of single-vessel CAD with LAD PCI back in 2006-patent by catheterization in 2011 with chronic HFpEF, along with COPD, HTN HLD who is being seen 02/01/2024 for the evaluation of non-STEMI with ongoing chest pain and borderline hypotension   Assessment & Plan    New Onset Afib RVR - new Afib RVR with hypotension 7/8 in the AM started on IV heparin  and IV amio load+infusion with conversion to SR around 5:30 PM - reported SOB, no chest pain - amio was transitioned to oral. He had episode of Afib this AM. Continue with amio load.400mg  BID x 1 week, 200mg  BID x 1 week, followed by 200mg  daily - CHADSAVASC (age x2, CHF, CAD) at least 4. Will require longterm a/c with Eliquis  - Keep Mag>2 and K>4. TSH  wnl - echo showed LVEF 25-30%, mod LVH, mild MR - he is also on digoxin  0.125mg  daily, check dig level tomrrow   NSTEMI - HS troponin peaked over 4000 - due to ongoing chest pain and hypotension patient was taken for urgent cardiac cath.  - right and left cardiac cath showed normal coronaries with minimal pLAD disease and widely patent mLAD stent.it was felt presentation consistent with pericarditis - continue Crestor  10mg  daily - no ASA given heparin    Acute pericarditis -started on colchicine  0.6mg BID and ibuprofen  800mg  TID x 3 months - ibuprofen  held for DOAC - no chest pain reported   HFrEF NICM - echo showed LVEF 25-30%, mod LVH, severe HK of left ventricular, mid-apical anteroseptal wall, anterior wall, apical segment and inferior wall, mild MR - he appears euvolemic - BP limiting GDMT - continue Losartan  12.5mg  daily, we will try Farxiga  10mg  daily    Hypotension - given IV bolus in the cath lab and IVF    COPD - per IM   HLD - LDL 32 - continue Crestor  10mg  daily    For questions or updates, please contact  [facility] Please consult [email or web address]  for contact info under       Signed, Corey Laski VEAR Fishman, PA-C

## 2024-02-04 NOTE — Progress Notes (Addendum)
 Heart Failure Stewardship Pharmacy Note  PCP: System, Provider Not In PCP-Cardiologist: Deatrice Cage, MD  HPI: Christopher Orr is a 78 y.o. male with COPD, CAD s/p PCI in 2006, HTN, HLD, lung & prostate cancer who presented with chest pain and shortness of breath. On admission, BNP was not measured, HS-troponin was 4107 > 3623, ESR 32, CRP 12, and lactic acid was 0.9. Chest x-ray noted no acute abnormality. ECG with AF RVR and hypotension. Patient underwent LHC and no culprit lesion was found. Patient was diagnosed with myopericarditis and started on colchicine  and ibuprofen .  Pertinent cardiac history: Longstanding CAD with prior PCI to LAD in 2006. Echo in 07/2014 with LVEF of 55%. Echo in 04/2018 with normal LVEF. Echo 11/2020 and 09/2021 with normal LVEF and grade I diastolic dysfunction. LHC 02/01/2023 noted no culprit lesion. RHC showed RA of 3, mPAP of 19, PCWP of 12, CO of 5.45, CI of 2.49. Echocardiogram 02/02/24 showed LVEF of 25-30, moderate LVH, mild MR.   Pertinent Lab Values: Creatinine, Ser  Date Value Ref Range Status  02/04/2024 1.21 0.61 - 1.24 mg/dL Final   BUN  Date Value Ref Range Status  02/04/2024 17 8 - 23 mg/dL Final  95/95/7976 18 8 - 27 mg/dL Final   Potassium  Date Value Ref Range Status  02/04/2024 3.8 3.5 - 5.1 mmol/L Final   Sodium  Date Value Ref Range Status  02/04/2024 138 135 - 145 mmol/L Final  10/29/2021 142 134 - 144 mmol/L Final   Magnesium   Date Value Ref Range Status  02/04/2024 2.1 1.7 - 2.4 mg/dL Final    Comment:    Performed at [facility], [address] [place], [place] [address]   Hgb A1c MFr Bld  Date Value Ref Range Status  04/08/2022 6.1 4.6 - 6.5 % Final    Comment:    Glycemic Control Guidelines for People with Diabetes:Non Diabetic:  <6%Goal of Therapy: <7%Additional Action Suggested:  >8%    TSH  Date Value Ref Range Status  02/02/2024 0.620 0.350 - 4.500 uIU/mL Final    Comment:    Performed by a 3rd  Generation assay with a functional sensitivity of <=0.01 uIU/mL. Performed at [facility], [address] [place], [place] [address]   05/27/2022 0.48 0.35 - 5.50 uIU/mL Final    Vital Signs: Temp:  [97.7 F (36.5 C)-98.6 F (37 C)] 98.3 F (36.8 C) (07/09 2100) Pulse Rate:  [76-105] 82 (07/10 0500) Cardiac Rhythm: Normal sinus rhythm (07/09 1900) Resp:  [12-23] 14 (07/10 0206) BP: (88-115)/(48-92) 93/60 (07/10 0500) SpO2:  [89 %-100 %] 91 % (07/10 0500) Weight:  [98.6 kg (217 lb 6 oz)] 98.6 kg (217 lb 6 oz) (07/10 0500)  Intake/Output Summary (Last 24 hours) at 02/04/2024 0732 Last data filed at 02/04/2024 0251 Gross per 24 hour  Intake 256.79 ml  Output 1675 ml  Net -1418.21 ml    Current Heart Failure Medications:  Loop diuretic: furosemide  20 mg PO daily Beta-Blocker: none ACEI/ARB/ARNI: losartan  12.5 mg daily MRA: none SGLT2i: none Other: none  Prior to admission Heart Failure Medications:  Loop diuretic: none Beta-Blocker: none ACEI/ARB/ARNI: none MRA: none SGLT2i: none Other: none  Assessment: 1. 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 NYHA class III symptoms.  -Symptoms: Denies chest pain today. Reports feeling improved overall. Ambulated the halls yesterday without issue per patient report. Denies shortness of breath, O2 stopped and saturations  are low 90s. Reports need for bowel movement. -Volume: RHC yesterday showed low-normal filling pressures s/p fluid bolus. Now reports some orthopnea and furosemide  20 mg PO daily started. Given modest diuretic requirement, would attempt to transition from furosemide  to spironolactone  and SGLT2i as BP improves. -Hemodynamics: BP soft. HR 80s. CO2 trending down, will need to watch closely for decompensation. -BB: Not currently on BB. Patient was not taking previously prescribed metoprolol . Would avoid at this time given low BP and high likelihood of  decompensation. -ACEI/ARB/ARNI: Losartan  12.5 mg daily added, BP soft, but no reported dizziness or lightheadedness. Ok to monitor for the time being.  -MRA: Can consider adding tomorrow pending BP. -SGLT2i: Consider adding prior to discharge. Copay check pending.  Plan: 1) Medication changes recommended at this time: -Consider adding bowel regimen at patient request  2) Patient assistance: -Entresto, Farxiga , and Jardiance copays are $0  3) Education: - Patient has been educated on current HF medications and potential additions to HF medication regimen - Patient verbalizes understanding that over the next few months, these medication doses may change and more medications may be added to optimize HF regimen - Patient has been educated on basic disease state pathophysiology and goals of therapy  Medication Assistance / Insurance Benefits Check: Does the patient have prescription insurance?    Type of insurance plan:  Does the patient qualify for medication assistance through manufacturers or grants? Pending  Outpatient Pharmacy: Prior to admission outpatient pharmacy: [facility]      Please do not hesitate to reach out with questions or concerns,  [name], PharmD, CPP, BCPS, [facility] Heart Failure Pharmacist  Phone - [phone] 02/04/2024 7:32 AM

## 2024-02-04 NOTE — Discharge Summary (Signed)
 TAISHAUN LEVELS FMW:[id number] DOB: 1946-05-04 DOA: 01/31/2024  PCP: System, Provider Not In  Admit date: 01/31/2024 Discharge date: 02/04/2024  Time spent: 35 minutes  Recommendations for Outpatient Follow-up:  Close pcp, chf clinic, and cardiology f/u scheduled Ongoing medication reconciliation important  Consider sleep study    Discharge Diagnoses:  Principal Problem:   NSTEMI (non-ST elevated myocardial infarction) (HCC) Active Problems:   COPD (chronic obstructive pulmonary disease) (HCC)   Hyperlipidemia   CAD (coronary artery disease)   Tobacco abuse   Lung cancer (HCC)   Alcohol  abuse   Bipolar disorder (HCC)   Stage 3a chronic kidney disease (HCC)   Dementia without behavioral disturbance (HCC)   Hypotension   Malnutrition of moderate degree   Atrial fibrillation with rapid ventricular response (HCC)   Acute pericarditis   Myopericarditis   Nonischemic cardiomyopathy (HCC)   Discharge Condition: stable  Diet recommendation: heart healthy  Filed Weights   02/02/24 0500 02/03/24 0500 02/04/24 0500  Weight: 99.4 kg 99 kg 98.6 kg    History of present illness:   The entirety of Dr. Eden h and p:  RAYNE LOISEAU is a 79 y.o. male with medical history significant of COPD, CAD s/p PCI in 2006, HTN, and HLD. He presented to [facility] ED complaining of chest pain that was left sided and midsternal. He states that he has been short of breath with it, but he denies diaphoresis or nausea. It was not exertional. EKG did not demonstrate any new ischemic changes, but his troponin was elevated at 4107.   Hospital Course:   # Chest pain # Myopericarditis # CAD # HFrEF Several days constant chest pain worse with exertion, trop elevated to 4000s. History DES to LAD in 2006. Started on IV heparin , taken urgently to cath which showed non-obstructive CAD, no culprit lesion. Cardiology thinks this is myopericarditis, possible stress induced cardiomyopathy. EF 25-30. Respiratory  viral panel negative. - colchicine  per cardiology  - apixaban  - gdmt with losartan , farxiga  - midodrine  for soft BPs, which preclude additional gdmt for now   # A-fib rvr New 7/8 with relative hypotension. New diagnosis, possibly triggered by above pericarditis. Controlled w/ amio but soft BPs. TSH wnl - amio, dig, apixaban  - consider outpt sleep study   # Bipolar - cont home abilify , fluoxetine , trazodone    # COPD Doesn't appear exacerbated   # History lung cancer Nothing acute seen on CXR. Prior wedge resection - outpt onc f/u   # Dementia No behavioral disturbance - cont home memantine  - hold home donepezil  given above cardiac conditions   # OAB, BPH Bladder scan 7/7 wnl - home mirabegron , flomax    # Debility PT/OT advising home health, ordered, along with home health RN for help with med mgmt  Procedures: LHC   Consultations: cardiology  Discharge Exam: Vitals:   02/04/24 0800 02/04/24 1200  BP:    Pulse:    Resp:    Temp: 98.1 F (36.7 C) 98.1 F (36.7 C)  SpO2:      General exam: Appears calm and comfortable  Respiratory system: Clear to auscultation. Respiratory effort normal. Cardiovascular system: tachycardic irregular Gastrointestinal system: Abdomen is nondistended, soft and nontender.   Central nervous system: Alert and oriented. No focal neurological deficits. Extremities: Symmetric 5 x 5 power. No lower extremity edema Skin: No rashes, lesions or ulcers Psychiatry: Judgement and insight appear normal. Mood & affect appropriate.  Discharge Instructions   Discharge Instructions     Diet - low sodium heart  healthy   Complete by: As directed    Diet - low sodium heart healthy   Complete by: As directed    Face-to-face encounter (required for Medicare/Medicaid patients)   Complete by: As directed    I Devaughn KATHEE Ban certify that this patient is under my care and that I, or a nurse practitioner or physician's assistant working with me, had a  face-to-face encounter that meets the physician face-to-face encounter requirements with this patient on 02/04/2024. The encounter with the patient was in whole, or in part for the following medical condition(s) which is the primary reason for home health care (List medical condition): systolic heart failure   The encounter with the patient was in whole, or in part, for the following medical condition, which is the primary reason for home health care: heart failure   I certify that, based on my findings, the following services are medically necessary home health services:  Nursing Physical therapy     Reason for Medically Necessary Home Health Services:  Skilled Nursing- Changes in Medication/Medication Management Therapy- Therapeutic Exercises to Increase Strength and Endurance     My clinical findings support the need for the above services: Shortness of breath with activity   Further, I certify that my clinical findings support that this patient is homebound due to: Shortness of Breath with activity   Home Health   Complete by: As directed    To provide the following care/treatments:  PT OT RN     Increase activity slowly   Complete by: As directed    Increase activity slowly   Complete by: As directed       Allergies as of 02/04/2024   No Known Allergies      Medication List     PAUSE taking these medications    donepezil  5 MG tablet Wait to take this until your doctor or other care provider tells you to start again. Commonly known as: ARICEPT  Take 1 tablet (5 mg total) by mouth at bedtime.       STOP taking these medications    celecoxib  100 MG capsule Commonly known as: CELEBREX    clopidogrel  75 MG tablet Commonly known as: PLAVIX    metoprolol  succinate 50 MG 24 hr tablet Commonly known as: TOPROL -XL       TAKE these medications    amiodarone  200 MG tablet Commonly known as: Pacerone  Take 1 tablet (200 mg total) by mouth 2 (two) times daily for 10 days,  THEN 1 tablet (200 mg total) daily. Start taking on: February 04, 2024   apixaban  5 MG Tabs tablet Commonly known as: ELIQUIS  Take 1 tablet (5 mg total) by mouth 2 (two) times daily.   ARIPiprazole  10 MG tablet Commonly known as: ABILIFY  TAKE 1 TABLET BY MOUTH EVERY NIGHT AT BEDTIME   colchicine  0.6 MG tablet Take 1 tablet (0.6 mg total) by mouth 2 (two) times daily.   dapagliflozin  propanediol 10 MG Tabs tablet Commonly known as: FARXIGA  Take 1 tablet (10 mg total) by mouth daily. Start taking on: February 05, 2024   digoxin  0.125 MG tablet Commonly known as: LANOXIN  Take 1 tablet (0.125 mg total) by mouth daily. Start taking on: February 05, 2024   famotidine  20 MG tablet Commonly known as: PEPCID  TAKE 1 TABLET DAILY   FLUoxetine  20 MG capsule Commonly known as: PROZAC  TAKE 1 CAPSULE BY MOUTH DAILY What changed: additional instructions   FLUoxetine  10 MG capsule Commonly known as: PROZAC  TAKE 3 CAPSULES BY MOUTH DAILY  What changed:  how much to take additional instructions   furosemide  20 MG tablet Commonly known as: LASIX  Take 1 tablet (20 mg total) by mouth daily. Start taking on: February 05, 2024   losartan  25 MG tablet Commonly known as: COZAAR  Take 0.5 tablets (12.5 mg total) by mouth daily. Start taking on: February 05, 2024   memantine  10 MG tablet Commonly known as: NAMENDA  Take 1 tablet (10 mg total) by mouth 2 (two) times daily.   midodrine  5 MG tablet Commonly known as: PROAMATINE  Take 1 tablet (5 mg total) by mouth 3 (three) times daily with meals.   mirabegron  ER 50 MG Tb24 tablet Commonly known as: Myrbetriq  Take 1 tablet (50 mg total) by mouth daily.   pantoprazole  40 MG tablet Commonly known as: PROTONIX  TAKE 1 TABLET BY MOUTH EVERY IN THE MORNING 30 MINUTES BEFORE BREAKFAST   rosuvastatin  10 MG tablet Commonly known as: CRESTOR  TAKE 1 TABLET BY MOUTH AT BEDTIME   sucralfate  1 g tablet Commonly known as: CARAFATE  TAKE 1 TABLET BY MOUTH FOUR TIMES  A DAY   tamsulosin  0.4 MG Caps capsule Commonly known as: FLOMAX  TAKE 1 CAPSULE DAILY   traZODone  100 MG tablet Commonly known as: DESYREL  TAKE 1 TABLET BY MOUTH AT BEDTIME AS NEEDED FOR SLEEP       No Known Allergies    The results of significant diagnostics from this hospitalization (including imaging, microbiology, ancillary and laboratory) are listed below for reference.    Significant Diagnostic Studies: ECHOCARDIOGRAM COMPLETE Result Date: 02/02/2024    ECHOCARDIOGRAM REPORT   Patient Name:   KLAYTON MONIE Date of Exam: 02/02/2024 Medical Rec #:  [id number]       Height:       72.0 in Accession #:    [id number]      Weight:       219.1 lb Date of Birth:  [birth year]       BSA:          2.215 m Patient Age:    78 years        BP:           89/68 mmHg Patient Gender: M               HR:           124 bpm. Exam Location:  [facility] Procedure: 2D Echo, Cardiac Doppler and Color Doppler (Both Spectral and Color            Flow Doppler were utilized during procedure). Indications:     Pericarditis  History:         Patient has prior history of Echocardiogram examinations, most                  recent 10/14/2021. CAD; Signs/Symptoms:Chest Pain.  Sonographer:     Christopher Furnace Referring Phys:  [id number] Shaira Sova BEDFORD Tereka Thorley Diagnosing Phys: Deatrice Cage MD IMPRESSIONS  1. Left ventricular ejection fraction, by estimation, is 25 to 30%. The left ventricle has severely decreased function. The left ventricle demonstrates regional wall motion abnormalities (see scoring diagram/findings for description). There is moderate left ventricular hypertrophy. Left ventricular diastolic parameters are indeterminate. There is severe hypokinesis of the left ventricular, mid-apical anteroseptal wall, anterior wall, apical segment and inferior wall.  2. Right ventricular systolic function is normal. The right ventricular size is normal. Tricuspid regurgitation signal is inadequate for assessing PA pressure.  3. Left atrial size  was mildly dilated.  4. Right atrial  size was mildly dilated.  5. The mitral valve is normal in structure. Mild mitral valve regurgitation. No evidence of mitral stenosis.  6. The aortic valve is normal in structure. Aortic valve regurgitation is not visualized. No aortic stenosis is present.  7. Aortic dilatation noted. There is mild dilatation of the ascending aorta, measuring 42 mm. Comparison(s): A prior study was performed on 09/2021. Changes from prior study are noted. The left ventricular function is significantly worse. FINDINGS  Left Ventricle: Left ventricular ejection fraction, by estimation, is 25 to 30%. The left ventricle has severely decreased function. The left ventricle demonstrates regional wall motion abnormalities. Severe hypokinesis of the left ventricular, mid-apical anteroseptal wall, anterior wall, apical segment and inferior wall. The left ventricular internal cavity size was normal in size. There is moderate left ventricular hypertrophy. Left ventricular diastolic parameters are indeterminate. Right Ventricle: The right ventricular size is normal. No increase in right ventricular wall thickness. Right ventricular systolic function is normal. Tricuspid regurgitation signal is inadequate for assessing PA pressure. Left Atrium: Left atrial size was mildly dilated. Right Atrium: Right atrial size was mildly dilated. Pericardium: There is no evidence of pericardial effusion. Mitral Valve: The mitral valve is normal in structure. Mild mitral valve regurgitation. No evidence of mitral valve stenosis. Tricuspid Valve: The tricuspid valve is normal in structure. Tricuspid valve regurgitation is trivial. No evidence of tricuspid stenosis. Aortic Valve: The aortic valve is normal in structure. Aortic valve regurgitation is not visualized. No aortic stenosis is present. Aortic valve mean gradient measures 2.0 mmHg. Aortic valve peak gradient measures 3.5 mmHg. Aortic valve area, by VTI measures 4.51  cm. Pulmonic Valve: The pulmonic valve was normal in structure. Pulmonic valve regurgitation is not visualized. No evidence of pulmonic stenosis. Aorta: The aortic root is normal in size and structure and aortic dilatation noted. There is mild dilatation of the ascending aorta, measuring 42 mm. Venous: The inferior vena cava was not well visualized. IAS/Shunts: No atrial level shunt detected by color flow Doppler.  LEFT VENTRICLE PLAX 2D LVIDd:         5.40 cm LVIDs:         3.80 cm LV PW:         1.20 cm LV IVS:        1.70 cm LVOT diam:     2.30 cm LV SV:         63 LV SV Index:   28 LVOT Area:     4.15 cm  LV Volumes (MOD) LV vol d, MOD A2C: 176.0 ml LV vol d, MOD A4C: 171.0 ml LV vol s, MOD A2C: 120.0 ml LV vol s, MOD A4C: 140.0 ml LV SV MOD A2C:     56.0 ml LV SV MOD A4C:     171.0 ml LV SV MOD BP:      46.0 ml RIGHT VENTRICLE RV Basal diam:  5.20 cm RV Mid diam:    4.60 cm RV S prime:     9.14 cm/s TAPSE (M-mode): 2.0 cm LEFT ATRIUM             Index        RIGHT ATRIUM           Index LA Vol (A2C):   50.2 ml 22.66 ml/m  RA Area:     21.90 cm LA Vol (A4C):   70.4 ml 31.78 ml/m  RA Volume:   68.80 ml  31.06 ml/m LA Biplane Vol: 65.0 ml 29.35 ml/m  AORTIC VALVE AV Area (Vmax):    4.04 cm AV Area (Vmean):   3.28 cm AV Area (VTI):     4.51 cm AV Vmax:           93.30 cm/s AV Vmean:          73.000 cm/s AV VTI:            0.139 m AV Peak Grad:      3.5 mmHg AV Mean Grad:      2.0 mmHg LVOT Vmax:         90.70 cm/s LVOT Vmean:        57.700 cm/s LVOT VTI:          0.151 m LVOT/AV VTI ratio: 1.09  AORTA Ao Root diam: 4.40 cm MITRAL VALVE               TRICUSPID VALVE MV Area (PHT): 6.65 cm    TR Peak grad:   22.1 mmHg MV Decel Time: 114 msec    TR Vmax:        235.00 cm/s MV E velocity: 99.80 cm/s                            SHUNTS                            Systemic VTI:  0.15 m                            Systemic Diam: 2.30 cm Deatrice Cage MD Electronically signed by Deatrice Cage MD Signature  Date/Time: 02/02/2024/5:09:13 PM    Final    CARDIAC CATHETERIZATION Result Date: 02/01/2024 Images from the original result were not included. Dominance: Right Angiographically normal coronary arteries with minimal proximal LAD disease and widely patent mid LAD stent. Suspect presentation is not consistent with non-STEMI given lack of culprit lesion-more consistent with PERICARDITIS Relatively Normal Right Heart Cath Numbers with mean PAP of 19 mmHg and PCWP of 12 mmHg along with LVEDP of 18 mmHg. Hypotensive on arrival with pressures in the high 80s that dropped into the 70s with verapamil .  With 250 mL x 2 IVF bolus, blood pressures upon completion of procedure were 100-105/50 mmHg by radial art line. => Lactate was 0.8-not consistent with shock. RECOMMENDATIONS I have started colchicine  0.6 mg twice daily and ibuprofen  800 mg 3 times daily Will hydrate gently for the next several hours-patient had relatively low filling pressures, improved with bolus -- Could probably transfer to telemetry later on in the morning Check 2D echo in the morning If pressures stable Patient had been on Plavix  per med list prior to arrival, but with plan for high-dose ibuprofen , would hold off on restarting Plavix  until completed ibuprofen  course. IV heparin  discontinued Alm Clay, MD  DG Chest 2 View Result Date: 01/31/2024 CLINICAL DATA:  Chest pain.  Shortness of breath. EXAM: CHEST - 2 VIEW COMPARISON:  Chest radiographs 10/29/2020, 02/14/2020; CT chest, abdomen, and pelvis 01/16/2023 FINDINGS: Cardiac silhouette and mediastinal contours are within normal limits. Mild calcification within aortic arch. No significant change in curvilinear left-greater-than-right chronic scarring. Mild blunting of the left costophrenic angle is improved from most recent 10/29/2020 radiograph. Mild blunting of the posterior costophrenic angle on lateral view is also improved from prior. There is again flattening of the diaphragms and moderate  hyperinflation. No acute  airspace opacity is seen. No pneumothorax. Moderate multilevel degenerative disc changes of the thoracic spine. IMPRESSION: 1. No acute cardiopulmonary process. 2. Moderate hyperinflation and prominent left basilar chronic scarring. 3. Mild blunting of the left costophrenic angle is improved from most recent 10/29/2020 radiograph. This appears to represent resolution of the prior small pleural effusion, likely with mild chronic residual scarring. Electronically Signed   By: Tanda Lyons M.D.   On: 01/31/2024 15:02    Microbiology: Recent Results (from the past 240 hours)  MRSA Next Gen by PCR, Nasal     Status: None   Collection Time: 01/31/24  7:22 PM   Specimen: Nasal Mucosa; Nasal Swab  Result Value Ref Range Status   MRSA by PCR Next Gen NOT DETECTED NOT DETECTED Final    Comment: (NOTE) The GeneXpert MRSA Assay (FDA approved for NASAL specimens only), is one component of a comprehensive MRSA colonization surveillance program. It is not intended to diagnose MRSA infection nor to guide or monitor treatment for MRSA infections. Test performance is not FDA approved in patients less than 82 years old. Performed at [facility], [address] [place], [place] [address]   SARS Coronavirus 2 by RT PCR (hospital order, performed in [facility] hospital lab) *cepheid single result test* Anterior Nasal Swab     Status: None   Collection Time: 02/01/24 12:03 PM   Specimen: Anterior Nasal Swab  Result Value Ref Range Status   SARS Coronavirus 2 by RT PCR NEGATIVE NEGATIVE Final    Comment: (NOTE) SARS-CoV-2 target nucleic acids are NOT DETECTED.  The SARS-CoV-2 RNA is generally detectable in upper and lower respiratory specimens during the acute phase of infection. The lowest concentration of SARS-CoV-2 viral copies this assay can detect is 250 copies / mL. A negative result does not preclude SARS-CoV-2 infection and should not be used as the sole basis  for treatment or other patient management decisions.  A negative result may occur with improper specimen collection / handling, submission of specimen other than nasopharyngeal swab, presence of viral mutation(s) within the areas targeted by this assay, and inadequate number of viral copies (<250 copies / mL). A negative result must be combined with clinical observations, patient history, and epidemiological information.  Fact Sheet for Patients:   RoadLapTop.co.za  Fact Sheet for Healthcare Providers: http://kim-miller.com/  This test is not yet approved or  cleared by the [place]  FDA and has been authorized for detection and/or diagnosis of SARS-CoV-2 by FDA under an Emergency Use Authorization (EUA).  This EUA will remain in effect (meaning this test can be used) for the duration of the COVID-19 declaration under Section 564(b)(1) of the Act, 21 U.S.C. section 360bbb-3(b)(1), unless the authorization is terminated or revoked sooner.  Performed at [facility], [address] [place], [place] [address]   Respiratory (~20 pathogens) panel by PCR     Status: None   Collection Time: 02/01/24 12:03 PM   Specimen: Nasopharyngeal Swab; Respiratory  Result Value Ref Range Status   Adenovirus NOT DETECTED NOT DETECTED Final   Coronavirus 229E NOT DETECTED NOT DETECTED Final    Comment: (NOTE) The Coronavirus on the Respiratory Panel, DOES NOT test for the novel  Coronavirus (2019 nCoV)    Coronavirus HKU1 NOT DETECTED NOT DETECTED Final   Coronavirus NL63 NOT DETECTED NOT DETECTED Final   Coronavirus OC43 NOT DETECTED NOT DETECTED Final   Metapneumovirus NOT DETECTED NOT DETECTED Final   Rhinovirus / Enterovirus NOT DETECTED NOT DETECTED  Final   Influenza A NOT DETECTED NOT DETECTED Final   Influenza B NOT DETECTED NOT DETECTED Final   Parainfluenza Virus 1 NOT DETECTED NOT DETECTED Final   Parainfluenza Virus 2 NOT  DETECTED NOT DETECTED Final   Parainfluenza Virus 3 NOT DETECTED NOT DETECTED Final   Parainfluenza Virus 4 NOT DETECTED NOT DETECTED Final   Respiratory Syncytial Virus NOT DETECTED NOT DETECTED Final   Bordetella pertussis NOT DETECTED NOT DETECTED Final   Bordetella Parapertussis NOT DETECTED NOT DETECTED Final   Chlamydophila pneumoniae NOT DETECTED NOT DETECTED Final   Mycoplasma pneumoniae NOT DETECTED NOT DETECTED Final    Comment: Performed at [facility], [address], [place], [place] [address]     Labs: Basic Metabolic Panel: Recent Labs  Lab 01/31/24 1434 02/01/24 0249 02/01/24 0300 02/01/24 0414 02/02/24 0315 02/03/24 0517 02/04/24 0415  NA 138   < > 137 136 136 137 138  K 4.2   < > 3.8 4.1 3.7 4.1 3.8  CL 103  --   --  104 105 109 109  CO2 26  --   --  23 22 21* 20*  GLUCOSE 136*  --   --  134* 122* 100* 133*  BUN 13  --   --  16 17 17 17   CREATININE 1.25*  --   --  1.21 0.97 1.10 1.21  CALCIUM  9.4  --   --  8.6* 8.5* 8.5* 8.3*  MG  --   --   --   --  2.2 2.2 2.1  PHOS  --   --   --   --  4.0 3.8 2.5   < > = values in this interval not displayed.   Liver Function Tests: No results for input(s): AST, ALT, ALKPHOS, BILITOT, PROT, ALBUMIN in the last 168 hours. No results for input(s): LIPASE, AMYLASE in the last 168 hours. No results for input(s): AMMONIA in the last 168 hours. CBC: Recent Labs  Lab 01/31/24 1434 02/01/24 0249 02/01/24 0255 02/01/24 0300 02/01/24 0414 02/03/24 0517 02/04/24 0415  WBC 16.7*  --   --   --  15.2* 14.8* 9.2  HGB 13.6   < > 11.2*  10.9* 10.9* 11.6* 11.0* 10.1*  HCT 40.3   < > 33.0*  32.0* 32.0* 34.6* 32.8* 29.8*  MCV 93.9  --   --   --  94.0 92.9 92.3  PLT 428*  --   --   --  349 296 305   < > = values in this interval not displayed.   Cardiac Enzymes: No results for input(s): CKTOTAL, CKMB, CKMBINDEX, TROPONINI in the last 168 hours. BNP: BNP (last 3 results) No results for  input(s): BNP in the last 8760 hours.  ProBNP (last 3 results) No results for input(s): PROBNP in the last 8760 hours.  CBG: Recent Labs  Lab 01/31/24 1650  GLUCAP 113*       Signed:  Devaughn KATHEE Ban MD.  [facility] 02/04/2024, 2:39 PM

## 2024-02-04 NOTE — TOC Transition Note (Signed)
 Transition of Care [facility]) - Discharge Note   Patient Details  Name: Christopher Orr MRN: [id number] Date of Birth: 1945-10-02  Transition of [facility]) CM/SW Contact:  Truc Winfree A Zilla Shartzer, RN Phone Number: 02/04/2024, 3:05 PM   Clinical Narrative:    Chart reviewed.  Noted that patient has orders for discharge today.  I have made [place] with [facility] that patient will discharge home today.  [facility] will provider home health PT/OT services.  Home Care services will start in 48-72 hours after discharge.    I have informed staff nurse of the above information.    Final next level of care: Home w Home Health Services Barriers to Discharge: No Barriers Identified   Patient Goals and CMS Choice   CMS Medicare.gov Compare Post Acute Care list provided to:: Patient Choice offered to / list presented to : Patient      Discharge Placement                    Patient and family notified of of transfer: 02/04/24  Discharge Plan and Services Additional resources added to the After Visit Summary for     Discharge Planning Services: CM Consult              DME Agency:  (Has a wooden walking cane at home.)       HH Arranged: PT, OT HH Agency: [facility]     Representative spoke with at [facility] Agency: [place]  Social Drivers of Health (SDOH) Interventions SDOH Screenings   Food Insecurity: Food Insecurity Present (02/04/2024)  Housing: Unknown (02/04/2024)  Transportation Needs: Unknown (02/04/2024)  Utilities: At Risk (01/31/2024)  Depression (PHQ2-9): High Risk (06/02/2023)  Financial Resource Strain: Low Risk  (04/29/2022)  Physical Activity: Inactive (12/25/2017)  Social Connections: Socially Isolated (01/31/2024)  Stress: No Stress Concern Present (06/25/2022)  Tobacco Use: High Risk (02/04/2024)     Readmission Risk Interventions     No data to display

## 2024-02-04 NOTE — Progress Notes (Signed)
 Heart Failure Nurse Navigator Progress Note  PCP: System, Provider Not In PCP-Cardiologist: Deatrice Cage, MD Admission Diagnosis: None Admitted from: Home  Presentation:   Christopher Orr presented complaining of chest pain that was left-sided and mid sternal.  Shortness of breath also noted.  HS-Troponin 4107. BNP -not measured. Chest x-ray: no acute abnormality  Hx:  COPD, CAD s/p PCI in 2006, HTN, HLD, HFrEF,  Lung and Prostate cancer, Dementia & Bipolar.  ECG with new AF-RVR and hypotension. Smoker and Alcohol  Abuse.   ECHO/ LVEF: 25-30%  Clinical Course:  Past Medical History:  Diagnosis Date   Abnormal findings on diagnostic imaging of digestive system    Acute kidney injury (HCC) 07/28/2014   Alcohol  abuse    pt states it is marijuana   Anemia    Arthritis    Arthritis 08/15/2019   Aspiration pneumonia (HCC) 07/28/2014   Bilateral inguinal hernia, without obstruction or gangrene, recurrent    Bipolar disorder (HCC)    BPH (benign prostatic hypertrophy) with urinary obstruction    CAD (coronary artery disease)    a. 2006 PCI/DES to LAD, EF 60%.   Chest pain 07/28/2014   Chronic heart failure with preserved ejection fraction (HFpEF) (HCC)    a. 07/2014 Echo: EF 55%; b. 11/2020 Echo: EF 50-55%, mod LVH, GrI DD, mildly enlarged RV w/ nl fxn, mild BAE.   Chronic pancreatitis (HCC)    Chronic viral hepatitis C (HCC) 09/24/2014   Overview:   GT 1a.   started on Harvoni 11/03/2014 for planned 12 week course, elastography shows cirrhosis.   Cigarette smoker 08/05/2018   Cirrhosis (HCC)    Cirrhosis of liver (HCC) 08/15/2019   Noted in 2016 imaging with alcohol  abuse and h/o hep C tx s/p harvoni       COPD (chronic obstructive pulmonary disease) (HCC)    Coronary artery disease involving native heart with angina pectoris (HCC) 05/31/2018   COVID-19    08/22/20   Dementia (HCC)    early dementia   Depression    Depression, recurrent (HCC) 06/14/2018   Dilated ascending aorta  and aortic root (HCC)    a. 11/2020 Echo: Ao root 44mm, Asc Ao 40mm. Ao arch 37mm.   Diverticulitis    12/06/20 [facility]    Diverticulosis    Drug use    Dyspnea    ED (erectile dysfunction)    Elevated troponin I level 07/28/2014   Gastritis    GERD (gastroesophageal reflux disease)    Headache    occasional migraines   Hepatitis C    treated   Hernia, inguinal, bilateral 08/15/2019   Recurrent s/p repair       History of lumbar fusion    History of pancreatitis 08/15/2019   X 2    History of prostate cancer 08/15/2019   2017 s/p brachytherapy with seeds   F/u Dr. Penne       Hyperlipidemia    Hypertension    patient denies having high blood pressure   Internal hemorrhoids 01/29/2021   Leg edema 01/03/2022   Lung cancer (HCC)    Malignant neoplasm of lower lobe of left lung (HCC) 05/15/2020   Memory loss 08/15/2019   Nodule of lower lobe of left lung 11/09/2019   Nodule of middle lobe of right lung 08/15/2019   Other chronic pain 01/29/2021   Pancreatitis    x 2    Pneumonia    07/06/18    Polyp of transverse colon  Poor nutrition 06/09/2022   Premature atrial contractions    a. 04/2021 Zio: Freq PACs w/ 8% burden.   Prostate cancer [facility])    with seeds   PSVT (paroxysmal supraventricular tachycardia) (HCC)    a. 04/2021 Zio: Avg HR 80 (60-210). 42 SVT runs - longest 5 beats @ 116, fastest 210 x 5 beats. Freq PACs (8%).   PTSD (post-traumatic stress disorder)    Rib fracture    s/p fall 03/15/19    Right ear pain 07/18/2021   S/P lumbar fusion 12/27/2020   Sinus bradycardia 01/29/2021   Sinus disease    SOB (shortness of breath) on exertion 05/15/2020   Squamous carcinoma of lung, left (HCC) 11/26/2019   Syncope 01/29/2021   Syncope due to orthostatic hypotension 01/23/2021   Umbilical hernia without obstruction and without gangrene    Urge incontinence    Viral upper respiratory tract infection 07/18/2021     Social History   Socioeconomic History    Marital status: Divorced    Spouse name: Not on file   Number of children: 4   Years of education: Not on file   Highest education level: GED or equivalent  Occupational History   Occupation: Curator    Comment: retired  Tobacco Use   Smoking status: Every Day    Current packs/day: 1.50    Average packs/day: 1.5 packs/day for 61.5 years (92.3 ttl pk-yrs)    Types: Cigarettes    Start date: 07/28/1962   Smokeless tobacco: Never   Tobacco comments:    1PPD 01/30/2022  Vaping Use   Vaping status: Never Used  Substance and Sexual Activity   Alcohol  use: Yes    Alcohol /week: 1.0 standard drink of alcohol     Types: 1 Cans of beer per week    Comment: couple weeks   Drug use: Yes    Frequency: 7.0 times per week    Types: Marijuana    Comment: Endorsed heroin 06/2014, UDS also + benzos, opiates, THC, neg for cocaine at that time.   Sexual activity: Not Currently  Other Topics Concern   Not on file  Social History Narrative   Lives at home    Has kids and grandkids   Smoker x 55 years 1 ppd as of 07/2019 he did quit x 2 years    Drinks 1 pt liq qd as of 07/2019    Social Drivers of Health   Financial Resource Strain: Low Risk  (04/29/2022)   Overall Financial Resource Strain (CARDIA)    Difficulty of Paying Living Expenses: Not hard at all  Food Insecurity: Food Insecurity Present (01/31/2024)   Hunger Vital Sign    Worried About Running Out of Food in the Last Year: Sometimes true    Ran Out of Food in the Last Year: Sometimes true  Transportation Needs: No Transportation Needs (01/31/2024)   PRAPARE - Administrator, Civil Service (Medical): No    Lack of Transportation (Non-Medical): No  Physical Activity: Inactive (12/25/2017)   Exercise Vital Sign    Days of Exercise per Week: 0 days    Minutes of Exercise per Session: 0 min  Stress: No Stress Concern Present (06/25/2022)   [employer] of Occupational Health - Occupational Stress Questionnaire     Feeling of Stress : Only a little  Social Connections: Socially Isolated (01/31/2024)   Social Connection and Isolation Panel    Frequency of Communication with Friends and Family: More than three times a week  Frequency of Social Gatherings with Friends and Family: Once a week    Attends Religious Services: Never    Database administrator or Organizations: No    Attends Engineer, structural: Never    Marital Status: Divorced  Water engineer and Provision:  Detailed education and instructions provided on heart failure disease management including the following:  Signs and symptoms of Heart Failure When to call the physician Importance of daily weights Low sodium diet Fluid restriction Medication management Anticipated future follow-up appointments  Patient education given on each of the above topics.  Patient acknowledges understanding via teach back method and acceptance of all instructions.  Education Materials:  Living Better With Heart Failure Booklet, HF zone tool, & Daily Weight Tracker Tool.  Patient has scale at home: Yes Patient has pill box at home: No.  Provided him with one for discharge.    High Risk Criteria for Readmission and/or Poor Patient Outcomes: Heart failure hospital admissions (last 6 months): 1  No Show rate: 9% Difficult social situation: Dementia Demonstrates medication adherence: Admits to missed medication doses.  Provided him with a pill box for discharge. Primary Language: English Literacy level: Reading, Writing, & Comprehension  Barriers of Care:   Transportation-[place] his daughter drives him to appointments Dementia Medication Administration  Considerations/Referrals:   Referral made to Heart Failure Pharmacist Stewardship: Yes Referral made to Heart Failure CSW/NCM TOC: No Referral made to Heart & Vascular TOC clinic: Yes. TOC [facility] on 02/09/2024 @ 3:30 PM  Items for Follow-up on DC/TOC: Daily  Weights-not currently performing. Diet & Fluid Restrictions-Loves salt. Stays within fluid restrictions Missed Medication doses Continued Heart Failure Education to review with a family member at HF TOC visit ETOH & Smoking Cessation  Charmaine Pines, RN, BSN [facility] Heart Failure Navigator Secure Chat Only

## 2024-02-04 NOTE — Progress Notes (Signed)
 Discharge papers and instructions provided to pt and patient daughter. All questions answered to satisfaction. PIVs removed with no complication. Belongings returned, pt dressed and transported via wheelchair to medical mall entrance to daughters personal vehicle.

## 2024-02-05 ENCOUNTER — Telehealth: Payer: Self-pay
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
## 2024-02-05 NOTE — Telephone Encounter (Signed)
 Copied from CRM [id number]. Topic: Clinical - Home Health Verbal Orders >> Feb 05, 2024 10:37 AM Estrellita L wrote: Caller/Agency: Channing with [facility]  Callback Number: [phone] Service Requested: Occupational Therapy, Physical Therapy, and Skilled Nursing Frequency: The patient was discharged from the hospital with evaluation orders and they are requesting to see if Dr. Sharma will be the one following the patient and giving orders.  Any new concerns about the patient? No
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
## 2024-02-08 ENCOUNTER — Ambulatory Visit: Admitting: Family

## 2024-02-08 ENCOUNTER — Ambulatory Visit: Admitting: Family Medicine

## 2024-02-08 ENCOUNTER — Encounter: Payer: Self-pay | Admitting: Family Medicine

## 2024-02-08 ENCOUNTER — Other Ambulatory Visit: Payer: Self-pay | Admitting: Family Medicine

## 2024-02-08 ENCOUNTER — Telehealth: Payer: Self-pay | Admitting: Family

## 2024-02-08 VITALS — BP 93/59 | HR 79 | Ht 72.0 in | Wt 217.0 lb

## 2024-02-08 DIAGNOSIS — J449 Chronic obstructive pulmonary disease, unspecified: Secondary | ICD-10-CM

## 2024-02-08 DIAGNOSIS — F319 Bipolar disorder, unspecified: Secondary | ICD-10-CM

## 2024-02-08 DIAGNOSIS — I4891 Unspecified atrial fibrillation: Secondary | ICD-10-CM | POA: Diagnosis not present

## 2024-02-08 DIAGNOSIS — I214 Non-ST elevation (NSTEMI) myocardial infarction: Secondary | ICD-10-CM

## 2024-02-08 DIAGNOSIS — Z85118 Personal history of other malignant neoplasm of bronchus and lung: Secondary | ICD-10-CM | POA: Diagnosis not present

## 2024-02-08 DIAGNOSIS — E861 Hypovolemia: Secondary | ICD-10-CM

## 2024-02-08 DIAGNOSIS — I25118 Atherosclerotic heart disease of native coronary artery with other forms of angina pectoris: Secondary | ICD-10-CM | POA: Diagnosis not present

## 2024-02-08 DIAGNOSIS — G4700 Insomnia, unspecified: Secondary | ICD-10-CM

## 2024-02-08 DIAGNOSIS — I959 Hypotension, unspecified: Secondary | ICD-10-CM

## 2024-02-08 DIAGNOSIS — F0390 Unspecified dementia without behavioral disturbance: Secondary | ICD-10-CM

## 2024-02-08 DIAGNOSIS — I5032 Chronic diastolic (congestive) heart failure: Secondary | ICD-10-CM

## 2024-02-08 DIAGNOSIS — E782 Mixed hyperlipidemia: Secondary | ICD-10-CM

## 2024-02-08 DIAGNOSIS — F0280 Dementia in other diseases classified elsewhere without behavioral disturbance: Secondary | ICD-10-CM

## 2024-02-08 DIAGNOSIS — G301 Alzheimer's disease with late onset: Secondary | ICD-10-CM | POA: Diagnosis not present

## 2024-02-08 MED ORDER — MIDODRINE HCL 10 MG PO TABS: 10.0000 mg | ORAL_TABLET | Freq: Three times a day (TID) | ORAL | 2 refills | Status: DC

## 2024-02-08 NOTE — Addendum Note (Signed)
 Addended by: SIMMONS-ROBINSON, Jazzmine Kleiman L on: 02/08/2024 04:39 PM   Modules accepted: Orders

## 2024-02-08 NOTE — Progress Notes (Addendum)
 New patient visit   Patient: Christopher Orr   DOB: [birth year]   78 y.o. Male  MRN: [id number] Visit Date: 02/08/2024  Today's healthcare provider: Rockie Agent, MD   Chief Complaint  Patient presents with   Hospitalization Follow-up    They was told he had a possible heart attack, he is on a lot of meds, which is causing him to feel dizzy and loss his balance, he is not eating, the 1st time eating since the HS was yesterday, he only took a few bites, he has an appt with vascular tomorrow, they also was told he is in Afib, information around the heart   Subjective    Christopher Orr is a 78 y.o. male who presents today as a new patient to establish care.   HPI     Hospitalization Follow-up    Additional comments: They was told he had a possible heart attack, he is on a lot of meds, which is causing him to feel dizzy and loss his balance, he is not eating, the 1st time eating since the HS was yesterday, he only took a few bites, he has an appt with vascular tomorrow, they also was told he is in Afib, information around the heart      Last edited by Thelbert Eulalio HERO, CMA on 02/08/2024  3:05 PM.       Discussed the use of AI scribe software for clinical note transcription with the patient, who gave verbal consent to proceed.  History of Present Illness Christopher Orr is a 78 year old male with NSTEMI and COPD who presents for a hospital follow-up after recent admission for NSTEMI. He is accompanied by his daughter and son, who are his primary caregivers.  He was admitted to the hospital from January 31, 2024, to February 05, 2024, for NSTEMI. During his hospital stay, he underwent a heart catheterization and was started on several new medications including amiodarone  and Eliquis . He experienced chest pain with elevated troponins at the time of admission.  He has a history of COPD, hyperlipidemia, coronary artery disease, tobacco use, lung cancer, alcohol  abuse, bipolar  disorder, chronic kidney disease stage 3A, dementia, hypertension, moderate malnutrition, atrial fibrillation with rapid ventricular response, pericarditis, myopericarditis, and non-ischemic cardiomyopathy. His lung cancer was treated surgically in 2023 with no current findings of recurrent malignancies.  He is currently on a complex medication regimen including amiodarone  200 mg twice daily for 10 days then 200 mg daily, Eliquis  5 mg twice daily, aripiprazole  10 mg daily, colchicine  0.6 mg twice daily, dapagliflozin  2 mg daily, digoxin  0.125 mg daily, and others. Donepezil  was paused due to cardiac conditions, and he continues on memantine .  He has difficulty with balance and a lack of appetite, which he attributes to the new medications. His caregivers note that he takes about 20 pills in the morning, although it should be 13, and they plan to reorganize his medication schedule. He also experiences nausea and low blood pressure, with a recent reading of 93/59 mmHg.  His social history includes a significant history of alcohol  use, previously consuming up to half a gallon of liquor a week, and smoking, which he continues to do. He lives with his daughter and son, who assist with his care, including medication management and attending appointments. He has a history of working in [employer].  He has a history of bipolar disorder and dementia, with his caregivers noting memory issues but disputing a diagnosis of Alzheimer's. He has  not been seen by psychiatry recently, although he did have psychiatric care in the past.  No significant swelling in his legs or abdomen prior to hospitalization. He sleeps primarily in his bed but occasionally naps in a recliner during the day. His caregivers note that he sometimes falls asleep in the recliner during the day.      Past Medical History:  Diagnosis Date   Abnormal findings on diagnostic imaging of digestive system    Acute kidney injury (HCC) 07/28/2014    Alcohol  abuse    pt states it is marijuana   Anemia    Arthritis    Arthritis 08/15/2019   Aspiration pneumonia (HCC) 07/28/2014   Bilateral inguinal hernia, without obstruction or gangrene, recurrent    Bipolar disorder (HCC)    BPH (benign prostatic hypertrophy) with urinary obstruction    CAD (coronary artery disease)    a. 2006 PCI/DES to LAD, EF 60%.   Chest pain 07/28/2014   Chronic heart failure with preserved ejection fraction (HFpEF) (HCC)    a. 07/2014 Echo: EF 55%; b. 11/2020 Echo: EF 50-55%, mod LVH, GrI DD, mildly enlarged RV w/ nl fxn, mild BAE.   Chronic pancreatitis (HCC)    Chronic viral hepatitis C (HCC) 09/24/2014   Overview:   GT 1a.   started on Harvoni 11/03/2014 for planned 12 week course, elastography shows cirrhosis.   Cigarette smoker 08/05/2018   Cirrhosis (HCC)    Cirrhosis of liver (HCC) 08/15/2019   Noted in 2016 imaging with alcohol  abuse and h/o hep C tx s/p harvoni       COPD (chronic obstructive pulmonary disease) (HCC)    Coronary artery disease involving native heart with angina pectoris (HCC) 05/31/2018   COVID-19    08/22/20   Dementia (HCC)    early dementia   Depression    Depression, recurrent (HCC) 06/14/2018   Dilated ascending aorta and aortic root (HCC)    a. 11/2020 Echo: Ao root 44mm, Asc Ao 40mm. Ao arch 37mm.   Diverticulitis    12/06/20 [facility]    Diverticulosis    Drug use    Dyspnea    ED (erectile dysfunction)    Elevated troponin I level 07/28/2014   Gastritis    GERD (gastroesophageal reflux disease)    Headache    occasional migraines   Hepatitis C    treated   Hernia, inguinal, bilateral 08/15/2019   Recurrent s/p repair       History of lumbar fusion    History of pancreatitis 08/15/2019   X 2    History of prostate cancer 08/15/2019   2017 s/p brachytherapy with seeds   F/u Dr. Penne       Hyperlipidemia    Hypertension    patient denies having high blood pressure   Internal hemorrhoids 01/29/2021   Leg  edema 01/03/2022   Lung cancer (HCC)    Malignant neoplasm of lower lobe of left lung (HCC) 05/15/2020   Memory loss 08/15/2019   Nodule of lower lobe of left lung 11/09/2019   Nodule of middle lobe of right lung 08/15/2019   Other chronic pain 01/29/2021   Pancreatitis    x 2    Pneumonia    07/06/18    Polyp of transverse colon    Poor nutrition 06/09/2022   Premature atrial contractions    a. 04/2021 Zio: Freq PACs w/ 8% burden.   Prostate cancer (HCC)    with seeds   PSVT (paroxysmal supraventricular tachycardia) (  HCC)    a. 04/2021 Zio: Avg HR 80 (60-210). 42 SVT runs - longest 5 beats @ 116, fastest 210 x 5 beats. Freq PACs (8%).   PTSD (post-traumatic stress disorder)    Rib fracture    s/p fall 03/15/19    Right ear pain 07/18/2021   S/P lumbar fusion 12/27/2020   Sinus bradycardia 01/29/2021   Sinus disease    SOB (shortness of breath) on exertion 05/15/2020   Squamous carcinoma of lung, left (HCC) 11/26/2019   Syncope 01/29/2021   Syncope due to orthostatic hypotension 01/23/2021   Umbilical hernia without obstruction and without gangrene    Urge incontinence    Viral upper respiratory tract infection 07/18/2021    Outpatient Medications Prior to Visit  Medication Sig   amiodarone  (PACERONE ) 200 MG tablet Take 1 tablet (200 mg total) by mouth 2 (two) times daily for 10 days, THEN 1 tablet (200 mg total) daily.   apixaban  (ELIQUIS ) 5 MG TABS tablet Take 1 tablet (5 mg total) by mouth 2 (two) times daily.   ARIPiprazole  (ABILIFY ) 10 MG tablet TAKE 1 TABLET BY MOUTH EVERY NIGHT AT BEDTIME   colchicine  0.6 MG tablet Take 1 tablet (0.6 mg total) by mouth 2 (two) times daily.   dapagliflozin  propanediol (FARXIGA ) 10 MG TABS tablet Take 1 tablet (10 mg total) by mouth daily.   digoxin  (LANOXIN ) 0.125 MG tablet Take 1 tablet (0.125 mg total) by mouth daily.   [Paused] donepezil  (ARICEPT ) 5 MG tablet Take 1 tablet (5 mg total) by mouth at bedtime.   famotidine  (PEPCID ) 20  MG tablet TAKE 1 TABLET DAILY   FLUoxetine  (PROZAC ) 10 MG capsule TAKE 3 CAPSULES BY MOUTH DAILY (Patient taking differently: Take 10 mg by mouth daily. Take 10 mg by mouth along with one 20 mg capsule for total 30 mg once daily)   FLUoxetine  (PROZAC ) 20 MG capsule TAKE 1 CAPSULE BY MOUTH DAILY (Patient taking differently: Take 20 mg by mouth daily. Take 20 mg capsule by mouth along with one 10 mg capsule for total 30 mg once daily)   furosemide  (LASIX ) 20 MG tablet Take 1 tablet (20 mg total) by mouth daily.   losartan  (COZAAR ) 25 MG tablet Take 0.5 tablets (12.5 mg total) by mouth daily.   memantine  (NAMENDA ) 10 MG tablet Take 1 tablet (10 mg total) by mouth 2 (two) times daily.   mirabegron  ER (MYRBETRIQ ) 50 MG TB24 tablet Take 1 tablet (50 mg total) by mouth daily.   pantoprazole  (PROTONIX ) 40 MG tablet TAKE 1 TABLET BY MOUTH EVERY IN THE MORNING 30 MINUTES BEFORE BREAKFAST   rosuvastatin  (CRESTOR ) 10 MG tablet TAKE 1 TABLET BY MOUTH AT BEDTIME   sucralfate  (CARAFATE ) 1 g tablet TAKE 1 TABLET BY MOUTH FOUR TIMES A DAY   tamsulosin  (FLOMAX ) 0.4 MG CAPS capsule TAKE 1 CAPSULE DAILY   traZODone  (DESYREL ) 100 MG tablet TAKE 1 TABLET BY MOUTH AT BEDTIME AS NEEDED FOR SLEEP   [DISCONTINUED] midodrine  (PROAMATINE ) 5 MG tablet Take 1 tablet (5 mg total) by mouth 3 (three) times daily with meals.   No facility-administered medications prior to visit.    Past Surgical History:  Procedure Laterality Date   CARDIAC CATHETERIZATION  2006   cateract  2013   COLONOSCOPY WITH PROPOFOL  N/A 10/04/2019   Procedure: COLONOSCOPY WITH PROPOFOL ;  Surgeon: Jinny Carmine, MD;  Location: [facility] ENDOSCOPY;  Service: Endoscopy;  Laterality: N/A;   CORONARY ANGIOPLASTY  2006   CORONARY STENT PLACEMENT  2006  x 1   CYSTOSCOPY W/ RETROGRADES N/A 08/08/2021   Procedure: CYSTOSCOPY WITH RETROGRADE PYELOGRAM;  Surgeon: Penne Knee, MD;  Location: [facility] ORS;  Service: Urology;  Laterality: N/A;   CYSTOSCOPY WITH URETHRAL  DILATATION N/A 08/08/2021   Procedure: CYSTOSCOPY WITH URETHRAL DILATATION WITH UROLUME BALLOON;  Surgeon: Penne Knee, MD;  Location: [facility] ORS;  Service: Urology;  Laterality: N/A;   ESOPHAGOGASTRODUODENOSCOPY N/A 12/22/2014   Procedure: ESOPHAGOGASTRODUODENOSCOPY (EGD);  Surgeon: Donnice Vaughn Manes, MD;  Location: [facility] ENDOSCOPY;  Service: Endoscopy;  Laterality: N/A;   ESOPHAGOGASTRODUODENOSCOPY (EGD) WITH PROPOFOL  N/A 10/04/2019   Procedure: ESOPHAGOGASTRODUODENOSCOPY (EGD) WITH PROPOFOL ;  Surgeon: Jinny Carmine, MD;  Location: [facility] ENDOSCOPY;  Service: Endoscopy;  Laterality: N/A;   HERNIA REPAIR  2015   left groin   INTERCOSTAL NERVE BLOCK Left 11/09/2019   Procedure: Intercostal Nerve Block;  Surgeon: Kerrin Elspeth BROCKS, MD;  Location: [facility] OR;  Service: Thoracic;  Laterality: Left;   LUMBAR FUSION     RADIOACTIVE SEED IMPLANT N/A 04/22/2016   Procedure: RADIOACTIVE SEED IMPLANT/BRACHYTHERAPY IMPLANT;  Surgeon: Knee Penne, MD;  Location: [facility] ORS;  Service: Urology;  Laterality: N/A;   RIGHT/LEFT HEART CATH AND CORONARY ANGIOGRAPHY N/A 02/01/2024   Procedure: RIGHT/LEFT HEART CATH AND CORONARY ANGIOGRAPHY;  Surgeon: Anner Alm ORN, MD;  Location: [facility] INVASIVE CV LAB;  Service: Cardiovascular;  Laterality: N/A;   ROBOT ASSISTED INGUINAL HERNIA REPAIR Bilateral 07/06/2018   Procedure: ROBOT ASSISTED INGUINAL HERNIA REPAIR;  Surgeon: Jordis Laneta FALCON, MD;  Location: [facility] ORS;  Service: General;  Laterality: Bilateral;   TONSILLECTOMY     UMBILICAL HERNIA REPAIR N/A 07/06/2018   Procedure: LAPAROSCOPIC ROBOT ASSISTED UMBILICAL HERNIA;  Surgeon: Jordis Laneta FALCON, MD;  Location: [facility] ORS;  Service: General;  Laterality: N/A;   XI ROBOTIC ASSISTED THORACOSCOPY- SEGMENTECTOMY Left 11/09/2019   Procedure: XI ROBOTIC ASSISTED THORACOSCOPY-WEDGE RESECTION LEFT LOWER LOBE;  Surgeon: Kerrin Elspeth BROCKS, MD;  Location: [facility] OR;  Service: Thoracic;  Laterality: Left;   Family Status  Relation Name  Status   Father  Deceased   Mother  Deceased   Sister  Alive   Brother  Alive   Sister  Alive   Sister  Deceased   Brother  Deceased   Neg Hx  (Not Specified)  No partnership data on file   Family History  Problem Relation Age of Onset   Heart disease Father        Unclear details. Father passed in late 9's 2/2 cancer.   Colon cancer Father    Alcohol  abuse Sister    Drug abuse Sister    Anxiety disorder Sister    Depression Sister    COPD Brother    Obesity Brother    Kidney disease Neg Hx    Prostate cancer Neg Hx    Social History   Socioeconomic History   Marital status: Divorced    Spouse name: Not on file   Number of children: 4   Years of education: Not on file   Highest education level: GED or equivalent  Occupational History   Occupation: Curator    Comment: retired  Tobacco Use   Smoking status: Every Day    Current packs/day: 1.50    Average packs/day: 1.5 packs/day for 61.5 years (92.3 ttl pk-yrs)    Types: Cigarettes    Start date: 07/28/1962   Smokeless tobacco: Never   Tobacco comments:    1PPD 01/31/2024  Vaping Use   Vaping status: Never Used  Substance and  Sexual Activity   Alcohol  use: Yes    Alcohol /week: 1.0 standard drink of alcohol     Types: 1 Cans of beer per week    Comment: occassional beer   Drug use: Yes    Frequency: 7.0 times per week    Types: Marijuana    Comment: Endorsed heroin 06/2014, UDS also + benzos, opiates, THC, neg for cocaine at that time.   Sexual activity: Not Currently  Other Topics Concern   Not on file  Social History Narrative   Lives at home    Has kids and grandkids   Smoker x 55 years 1 ppd as of 07/2019 he did quit x 2 years    Drinks 1 pt liq qd as of 07/2019    Social Drivers of Health   Financial Resource Strain: High Risk (02/08/2024)   Overall Financial Resource Strain (CARDIA)    Difficulty of Paying Living Expenses: Very hard  Food Insecurity: Food Insecurity Present (02/05/2024)    Hunger Vital Sign    Worried About Running Out of Food in the Last Year: Sometimes true    Ran Out of Food in the Last Year: Sometimes true  Transportation Needs: No Transportation Needs (02/05/2024)   PRAPARE - Administrator, Civil Service (Medical): No    Lack of Transportation (Non-Medical): No  Physical Activity: Inactive (12/25/2017)   Exercise Vital Sign    Days of Exercise per Week: 0 days    Minutes of Exercise per Session: 0 min  Stress: No Stress Concern Present (02/08/2024)   [employer] of Occupational Health - Occupational Stress Questionnaire    Feeling of Stress: Not at all  Social Connections: Socially Isolated (01/31/2024)   Social Connection and Isolation Panel    Frequency of Communication with Friends and Family: More than three times a week    Frequency of Social Gatherings with Friends and Family: Once a week    Attends Religious Services: Never    Database administrator or Organizations: No    Attends Engineer, structural: Never    Marital Status: Divorced     No Known Allergies  Immunization History  Administered Date(s) Administered   Fluad Quad(high Dose 65+) 05/04/2019, 04/08/2022   Fluad Trivalent(High Dose 65+) 06/02/2023   Influenza, High Dose Seasonal PF 04/15/2019, 05/10/2020   Influenza,inj,Quad PF,6+ Mos 05/04/2018   Influenza-Unspecified 04/27/2021   PFIZER(Purple Top)SARS-COV-2 Vaccination 09/28/2019, 10/19/2019, 05/10/2020   PNEUMOCOCCAL CONJUGATE-20 04/22/2022   Pneumococcal Conjugate-13 04/15/2019    Health Maintenance  Topic Date Due   DTaP/Tdap/Td (1 - Tdap) Never done   Zoster Vaccines- Shingrix (1 of 2) Never done   Hepatitis B Vaccines (1 of 3 - Risk 3-dose series) Never done   COVID-19 Vaccine (4 - 2024-25 season) 03/29/2023   Medicare Annual Wellness (AWV)  04/30/2023   INFLUENZA VACCINE  02/26/2024   Pneumococcal Vaccine: 50+ Years  Completed   Hepatitis C Screening  Completed   HPV VACCINES  Aged  Out   Meningococcal B Vaccine  Aged Out   Colonoscopy  Discontinued    Patient Care Team: Sharma Coyer, MD as PCP - General (Family Medicine) Darron Deatrice LABOR, MD as PCP - Cardiology (Cardiology) Cindie Ole DASEN, MD as PCP - Electrophysiology (Cardiology) Verdene Gills, RN as Oncology Nurse Navigator [place], [place], LCSW as Social Worker [place], Engineer, site, RN as VBCI Care Management  Review of Systems      Objective    BP (!) 93/59   Pulse  79   Ht 6' (1.829 m)   Wt 217 lb (98.4 kg)   SpO2 90%   BMI 29.43 kg/m      Depression Screen    02/08/2024    3:00 PM 06/02/2023    1:05 PM 08/21/2022    8:06 AM 06/25/2022   11:42 AM  PHQ 2/9 Scores  PHQ - 2 Score 1 4 3 1   PHQ- 9 Score 5 13 12     No results found for any visits on 02/08/24.   Physical Exam Physical Exam VITALS: BP- 93/59 CARDIOVASCULAR: Regular heart rhythm. PULM: no wheezing, no crackles, normal WOB on RA  EXTREMITIES: No pitting edema in extremities.      Assessment & Plan      Problem List Items Addressed This Visit       Cardiovascular and Mediastinum   NSTEMI (non-ST elevated myocardial infarction) (HCC) - Primary   Relevant Medications   midodrine  (PROAMATINE ) 10 MG tablet   Hypotension   Relevant Medications   midodrine  (PROAMATINE ) 10 MG tablet   Chronic diastolic heart failure (HCC)   Relevant Medications   midodrine  (PROAMATINE ) 10 MG tablet   CAD (coronary artery disease)   Relevant Medications   midodrine  (PROAMATINE ) 10 MG tablet   Atrial fibrillation with rapid ventricular response (HCC)   Relevant Medications   midodrine  (PROAMATINE ) 10 MG tablet     Respiratory   COPD (chronic obstructive pulmonary disease) (HCC) (Chronic)     Nervous and Auditory   Late onset Alzheimer's disease without behavioral disturbance (HCC)   Dementia without behavioral disturbance (HCC)     Other   Insomnia   Hyperlipidemia   Relevant Medications   midodrine   (PROAMATINE ) 10 MG tablet   Bipolar disorder (HCC)   Other Visit Diagnoses       History of lung cancer           Assessment & Plan Heart Failure Admitted with NSTEMI and diagnosed with heart failure. Echocardiogram shows ejection fraction of 25-30%. Symptoms include chest pain, elevated troponins, and fatigue. Heart failure contributes to hypotension and fatigue. Medications include amiodarone , Eliquis , and digoxin  to manage heart failure and atrial fibrillation. Goal is to improve cardiac function and reduce cardiac workload. - Continue amiodarone  200 mg twice daily for 10 days, then reduce to 200 mg daily - Continue Eliquis  5 mg twice daily - Continue digoxin  0.125 mg daily - Increase midodrine  to 10 mg to address hypotension - Follow up with cardiologist tomorrow  Atrial Fibrillation Atrial fibrillation managed with medications, contributing to heart failure and symptoms of fatigue and dizziness. Medications aim to control heart rate and prevent thromboembolism. - Continue amiodarone  200 mg twice daily for 10 days, then reduce to 200 mg daily - Continue Eliquis  5 mg twice daily - Continue digoxin  0.125 mg daily - Increase midodrine  to 10 mg to address hypotension - Follow up with cardiologist tomorrow  Hypertension Hypertension managed with losartan . Current hypotension likely due to heart failure and medications. Midodrine  increased to address hypotension. - Continue losartan  12.5 mg daily - Increase midodrine  to 10 mg to address hypotension  Chronic Obstructive Pulmonary Disease (COPD) COPD.  Malnutrition Experiencing anorexia and difficulty eating, contributing to moderate malnutrition. Likely exacerbated by new medications and heart failure. - Monitor nutritional intake  Dementia Dementia affecting memory and cognitive function. Donepezil  paused due to cardiac conditions, continuing memantine . Family considering advanced healthcare directives. - Continue memantine  10  mg twice daily - Discuss advanced healthcare directives with family  Bipolar  Disorder Bipolar disorder managed with Abilify , fluoxetine , and trazodone . - Continue Abilify  10 mg daily - Continue fluoxetine  30 mg daily - Continue trazodone  100 mg as needed at bedtime for sleep  Lung Cancer (Postoperative) Lung cancer treated surgically in 2023 with no current findings of recurrent malignancies.  General Health Maintenance Tobacco use and alcohol  abuse. Smoking cessation and alcohol  reduction recommended for overall health improvement. - Encourage smoking cessation - Encourage reduction of alcohol  consumption  Goals of Care Family considering establishing a power of attorney and discussing advanced healthcare directives. Discussion includes ensuring wishes are respected in case of further cognitive decline, including decisions about life support and intubation. - Discuss advanced healthcare directives with family - Consider establishing a power of attorney  Follow-up Multiple follow-up appointments scheduled to manage complex medical conditions. - Follow up with cardiologist tomorrow - Schedule a six-week follow-up to review specialist recommendations and ensure all care plans are aligned      Return in about 6 weeks (around 03/21/2024) for CHRONIC F/U.      Rockie Agent, MD  [facility] [phone] (phone) [phone] (fax)  [facility]

## 2024-02-08 NOTE — Telephone Encounter (Signed)
 Called to confirm/remind patient of their appointment at the [facility] on 02/09/24.   Appointment:   [x] Confirmed  [] Left mess   [] No answer/No voice mail  [] VM Full/unable to leave message  [] Phone not in service  Patient reminded to bring all medications and/or complete list.  Confirmed patient has transportation. Gave directions, instructed to utilize valet parking.

## 2024-02-09 ENCOUNTER — Encounter: Payer: Self-pay | Admitting: Family

## 2024-02-09 ENCOUNTER — Ambulatory Visit: Attending: Family | Admitting: Family

## 2024-02-09 VITALS — BP 93/58 | HR 73 | Wt 214.0 lb

## 2024-02-09 DIAGNOSIS — I4891 Unspecified atrial fibrillation: Secondary | ICD-10-CM | POA: Diagnosis not present

## 2024-02-09 DIAGNOSIS — I5022 Chronic systolic (congestive) heart failure: Secondary | ICD-10-CM | POA: Insufficient documentation

## 2024-02-09 DIAGNOSIS — Z85118 Personal history of other malignant neoplasm of bronchus and lung: Secondary | ICD-10-CM | POA: Insufficient documentation

## 2024-02-09 DIAGNOSIS — I480 Paroxysmal atrial fibrillation: Secondary | ICD-10-CM

## 2024-02-09 DIAGNOSIS — E785 Hyperlipidemia, unspecified: Secondary | ICD-10-CM | POA: Insufficient documentation

## 2024-02-09 DIAGNOSIS — I2510 Atherosclerotic heart disease of native coronary artery without angina pectoris: Secondary | ICD-10-CM | POA: Insufficient documentation

## 2024-02-09 DIAGNOSIS — N189 Chronic kidney disease, unspecified: Secondary | ICD-10-CM | POA: Insufficient documentation

## 2024-02-09 DIAGNOSIS — F17210 Nicotine dependence, cigarettes, uncomplicated: Secondary | ICD-10-CM | POA: Diagnosis not present

## 2024-02-09 DIAGNOSIS — I319 Disease of pericardium, unspecified: Secondary | ICD-10-CM | POA: Diagnosis not present

## 2024-02-09 DIAGNOSIS — I130 Hypertensive heart and chronic kidney disease with heart failure and stage 1 through stage 4 chronic kidney disease, or unspecified chronic kidney disease: Secondary | ICD-10-CM | POA: Diagnosis not present

## 2024-02-09 DIAGNOSIS — F0390 Unspecified dementia without behavioral disturbance: Secondary | ICD-10-CM | POA: Diagnosis not present

## 2024-02-09 DIAGNOSIS — I509 Heart failure, unspecified: Secondary | ICD-10-CM | POA: Diagnosis not present

## 2024-02-09 DIAGNOSIS — Z955 Presence of coronary angioplasty implant and graft: Secondary | ICD-10-CM | POA: Diagnosis not present

## 2024-02-09 DIAGNOSIS — J449 Chronic obstructive pulmonary disease, unspecified: Secondary | ICD-10-CM | POA: Diagnosis not present

## 2024-02-09 DIAGNOSIS — R0602 Shortness of breath: Secondary | ICD-10-CM | POA: Diagnosis present

## 2024-02-09 DIAGNOSIS — I10 Essential (primary) hypertension: Secondary | ICD-10-CM

## 2024-02-09 DIAGNOSIS — Z8546 Personal history of malignant neoplasm of prostate: Secondary | ICD-10-CM | POA: Insufficient documentation

## 2024-02-09 DIAGNOSIS — Z7901 Long term (current) use of anticoagulants: Secondary | ICD-10-CM | POA: Insufficient documentation

## 2024-02-09 DIAGNOSIS — F319 Bipolar disorder, unspecified: Secondary | ICD-10-CM | POA: Diagnosis not present

## 2024-02-09 DIAGNOSIS — Z720 Tobacco use: Secondary | ICD-10-CM

## 2024-02-09 NOTE — Patient Instructions (Signed)
Begin weighing daily and call for an overnight weight gain of 3 pounds or more or a weekly weight gain of more than 5 pounds.  

## 2024-02-09 NOTE — Patient Instructions (Addendum)
 Medication Changes:  STOP LOSARTAN   START LASIX  AS NEEDED FOR OVERNIGHT WEIGHT GAIN OR SWELLING   Follow-Up in: 2 WEEKS WITH DR. GARDENIA.  At the [facility], you and your health needs are our priority. We have a designated team specialized in the treatment of Heart Failure. This Care Team includes your primary Heart Failure Specialized Cardiologist (physician), Advanced Practice Providers (APPs- Physician Assistants and Nurse Practitioners), and Pharmacist who all work together to provide you with the care you need, when you need it.   You may see any of the following providers on your designated Care Team at your next follow up:  Dr. Toribio Fuel Dr. Ezra Shuck Dr. Ria GARDENIA Dr. Odis Brownie Ellouise Class, FNP [name], RPH-CPP  Please be sure to bring in all your medications bottles to every appointment.   Need to Contact Us :  If you have any questions or concerns before your next appointment please send us  a message through [facility] or call our office at [phone].    TO LEAVE A MESSAGE FOR THE NURSE SELECT OPTION 2, PLEASE LEAVE A MESSAGE INCLUDING: YOUR NAME DATE OF BIRTH CALL BACK NUMBER REASON FOR CALL**this is important as we prioritize the call backs  YOU WILL RECEIVE A CALL BACK THE SAME DAY AS LONG AS YOU CALL BEFORE 4:00 PM

## 2024-02-09 NOTE — Progress Notes (Signed)
 Advanced Heart Failure Clinic Note   Referring Physician: admission PCP: Sharma Coyer, MD Cardiologist: Deatrice Cage, MD   Chief Complaint: shortness of breath   HPI:  Christopher Orr is a 78 y/o male with a history of COPD, CAD s/p PCI with DES to LAD in 2006, HTN, HLD, chronic heart failure, pericarditis, bipolar, alcohol / tobacco use, CKD, dementia, AF/ RVR, lung & prostate cancer.   Pertinent cardiac history: Longstanding CAD with prior PCI to LAD in 2006. Echo in 07/2014 with LVEF of 55%. Echo in 04/2018 with normal LVEF. Echo 11/2020 and 09/2021 with normal LVEF and grade I diastolic dysfunction.  Admitted 01/31/24 with chest pain and shortness of breath. On admission, BNP was not measured, HS-troponin was 4107 > 3623, ESR 32, CRP 12, and lactic acid was 0.9. Chest x-ray noted no acute abnormality. ECG with AF RVR and hypotension. R/LHC 02/01/24 and no culprit lesion was found. RHC showed RA of 3, mPAP of 19, PCWP of 12, CO of 5.45, CI of 2.49. Echo 02/02/24: LVEF of 25-30%, moderate LVH, mild Christopher. Patient was diagnosed with myopericarditis and started on colchicine  and ibuprofen .   He presents today, with his son/ DIL, for his initial HF visit with a chief complaint of shortness of breath. Has associated fatigue, dizziness, decreased appetite. Sleeping well on 1 pillow. Denies chest pain, palpitations, abdominal distention, pedal edema or abdominal pain. DIL's biggest concern is of his decreased appetite and dizziness that he's having. Midodrine  was increased yesterday by PCP but he's only had 1 dose of midodrine  today. Dizziness and decreased appetite have only been present since recent discharge.   Smoking 1/2 ppd cigarettes daily. Denies alcohol  or drug use.   Review of Systems: [y] = yes, [ ]  = no   General: Weight gain [ ] ; Weight loss [ ] ; Anorexia [ ] ; Fatigue Davis.Dad ]; Fever [ ] ; Chills [ ] ; Weakness [ ]   Cardiac: Chest pain/pressure [ ] ; Resting SOB [ ] ; Exertional SOB Davis.Dad  ]; Orthopnea [ ] ; Pedal Edema [ ] ; Palpitations [ ] ; Syncope [ ] ; Presyncope [ ] ; Paroxysmal nocturnal dyspnea[ ]   Pulmonary: Cough [ ] ; Wheezing[ ] ; Hemoptysis[ ] ; Sputum [ ] ; Snoring [ ]   GI: Decreased appetite[y]; Dysphagia[ ] ; Melena[ ] ; Hematochezia [ ] ; Heartburn[ ] ; Abdominal pain [ ] ; Constipation [ ] ; Diarrhea [ ] ; BRBPR [ ]   GU: Hematuria[ ] ; Dysuria [ ] ; Nocturia[ ]   Vascular: Pain in legs with walking [ ] ; Pain in feet with lying flat [ ] ; Non-healing sores [ ] ; Stroke [ ] ; TIA [ ] ; Slurred speech [ ] ;  Neuro: Dizziness[y ]; Vertigo[ ] ; Seizures[ ] ; Paresthesias[ ] ;Blurred vision [ ] ; Diplopia [ ] ; Vision changes [ ]   Ortho/Skin: Arthritis [ ] ; Joint pain [ ] ; Muscle pain [ ] ; Joint swelling [ ] ; Back Pain [ ] ; Rash [ ]   Psych: Depression[ ] ; Anxiety[ ]   Heme: Bleeding problems [ ] ; Clotting disorders [ ] ; Anemia [ ]   Endocrine: Diabetes [ ] ; Thyroid  dysfunction[ ]    Past Medical History:  Diagnosis Date   Abnormal findings on diagnostic imaging of digestive system    Acute kidney injury (HCC) 07/28/2014   Alcohol  abuse    pt states it is marijuana   Anemia    Arthritis    Arthritis 08/15/2019   Aspiration pneumonia (HCC) 07/28/2014   Bilateral inguinal hernia, without obstruction or gangrene, recurrent    Bipolar disorder (HCC)    BPH (benign prostatic hypertrophy) with urinary obstruction    CAD (  coronary artery disease)    a. 2006 PCI/DES to LAD, EF 60%.   Chest pain 07/28/2014   Chronic heart failure with preserved ejection fraction (HFpEF) (HCC)    a. 07/2014 Echo: EF 55%; b. 11/2020 Echo: EF 50-55%, mod LVH, GrI DD, mildly enlarged RV w/ nl fxn, mild BAE.   Chronic pancreatitis (HCC)    Chronic viral hepatitis C (HCC) 09/24/2014   Overview:   GT 1a.   started on Harvoni 11/03/2014 for planned 12 week course, elastography shows cirrhosis.   Cigarette smoker 08/05/2018   Cirrhosis (HCC)    Cirrhosis of liver (HCC) 08/15/2019   Noted in 2016 imaging with alcohol  abuse  and h/o hep C tx s/p harvoni       COPD (chronic obstructive pulmonary disease) (HCC)    Coronary artery disease involving native heart with angina pectoris (HCC) 05/31/2018   COVID-19    08/22/20   Dementia (HCC)    early dementia   Depression    Depression, recurrent (HCC) 06/14/2018   Dilated ascending aorta and aortic root (HCC)    a. 11/2020 Echo: Ao root 44mm, Asc Ao 40mm. Ao arch 37mm.   Diverticulitis    12/06/20 [facility]    Diverticulosis    Drug use    Dyspnea    ED (erectile dysfunction)    Elevated troponin I level 07/28/2014   Gastritis    GERD (gastroesophageal reflux disease)    Headache    occasional migraines   Hepatitis C    treated   Hernia, inguinal, bilateral 08/15/2019   Recurrent s/p repair       History of lumbar fusion    History of pancreatitis 08/15/2019   X 2    History of prostate cancer 08/15/2019   2017 s/p brachytherapy with seeds   F/u Dr. Penne       Hyperlipidemia    Hypertension    patient denies having high blood pressure   Internal hemorrhoids 01/29/2021   Leg edema 01/03/2022   Lung cancer (HCC)    Malignant neoplasm of lower lobe of left lung (HCC) 05/15/2020   Memory loss 08/15/2019   Nodule of lower lobe of left lung 11/09/2019   Nodule of middle lobe of right lung 08/15/2019   Other chronic pain 01/29/2021   Pancreatitis    x 2    Pneumonia    07/06/18    Polyp of transverse colon    Poor nutrition 06/09/2022   Premature atrial contractions    a. 04/2021 Zio: Freq PACs w/ 8% burden.   Prostate cancer [facility])    with seeds   PSVT (paroxysmal supraventricular tachycardia) (HCC)    a. 04/2021 Zio: Avg HR 80 (60-210). 42 SVT runs - longest 5 beats @ 116, fastest 210 x 5 beats. Freq PACs (8%).   PTSD (post-traumatic stress disorder)    Rib fracture    s/p fall 03/15/19    Right ear pain 07/18/2021   S/P lumbar fusion 12/27/2020   Sinus bradycardia 01/29/2021   Sinus disease    SOB (shortness of breath) on exertion 05/15/2020    Squamous carcinoma of lung, left (HCC) 11/26/2019   Syncope 01/29/2021   Syncope due to orthostatic hypotension 01/23/2021   Umbilical hernia without obstruction and without gangrene    Urge incontinence    Viral upper respiratory tract infection 07/18/2021    Current Outpatient Medications  Medication Sig Dispense Refill   amiodarone  (PACERONE ) 200 MG tablet Take 1 tablet (200 mg total)  by mouth 2 (two) times daily for 10 days, THEN 1 tablet (200 mg total) daily. 60 tablet 1   apixaban  (ELIQUIS ) 5 MG TABS tablet Take 1 tablet (5 mg total) by mouth 2 (two) times daily. 60 tablet 1   ARIPiprazole  (ABILIFY ) 10 MG tablet TAKE 1 TABLET BY MOUTH EVERY NIGHT AT BEDTIME 28 tablet 12   colchicine  0.6 MG tablet Take 1 tablet (0.6 mg total) by mouth 2 (two) times daily. 60 tablet 1   dapagliflozin  propanediol (FARXIGA ) 10 MG TABS tablet Take 1 tablet (10 mg total) by mouth daily. 30 tablet 1   digoxin  (LANOXIN ) 0.125 MG tablet Take 1 tablet (0.125 mg total) by mouth daily. 30 tablet 1   [Paused] donepezil  (ARICEPT ) 5 MG tablet Take 1 tablet (5 mg total) by mouth at bedtime. 90 tablet 3   famotidine  (PEPCID ) 20 MG tablet TAKE 1 TABLET DAILY 28 tablet 0   FLUoxetine  (PROZAC ) 10 MG capsule TAKE 3 CAPSULES BY MOUTH DAILY (Patient taking differently: Take 10 mg by mouth daily. Take 10 mg by mouth along with one 20 mg capsule for total 30 mg once daily) 84 capsule 6   FLUoxetine  (PROZAC ) 20 MG capsule TAKE 1 CAPSULE BY MOUTH DAILY (Patient taking differently: Take 20 mg by mouth daily. Take 20 mg capsule by mouth along with one 10 mg capsule for total 30 mg once daily) 28 capsule 12   furosemide  (LASIX ) 20 MG tablet Take 1 tablet (20 mg total) by mouth daily. 30 tablet 1   losartan  (COZAAR ) 25 MG tablet Take 0.5 tablets (12.5 mg total) by mouth daily. 30 tablet 1   memantine  (NAMENDA ) 10 MG tablet Take 1 tablet (10 mg total) by mouth 2 (two) times daily. 180 tablet 3   midodrine  (PROAMATINE ) 10 MG tablet  Take 1 tablet (10 mg total) by mouth 3 (three) times daily with meals. 90 tablet 2   mirabegron  ER (MYRBETRIQ ) 50 MG TB24 tablet Take 1 tablet (50 mg total) by mouth daily. 28 tablet 12   pantoprazole  (PROTONIX ) 40 MG tablet TAKE 1 TABLET BY MOUTH EVERY IN THE MORNING 30 MINUTES BEFORE BREAKFAST 28 tablet 0   rosuvastatin  (CRESTOR ) 10 MG tablet TAKE 1 TABLET BY MOUTH AT BEDTIME 28 tablet 6   sucralfate  (CARAFATE ) 1 g tablet TAKE 1 TABLET BY MOUTH FOUR TIMES A DAY 112 tablet 0   tamsulosin  (FLOMAX ) 0.4 MG CAPS capsule TAKE 1 CAPSULE DAILY 90 capsule 3   traZODone  (DESYREL ) 100 MG tablet TAKE 1 TABLET BY MOUTH AT BEDTIME AS NEEDED FOR SLEEP 28 tablet 0   No current facility-administered medications for this visit.    No Known Allergies    Social History   Socioeconomic History   Marital status: Divorced    Spouse name: Not on file   Number of children: 4   Years of education: Not on file   Highest education level: GED or equivalent  Occupational History   Occupation: Curator    Comment: retired  Tobacco Use   Smoking status: Every Day    Current packs/day: 1.50    Average packs/day: 1.5 packs/day for 61.5 years (92.3 ttl pk-yrs)    Types: Cigarettes    Start date: 07/28/1962   Smokeless tobacco: Never   Tobacco comments:    1PPD 01/31/2024  Vaping Use   Vaping status: Never Used  Substance and Sexual Activity   Alcohol  use: Yes    Alcohol /week: 1.0 standard drink of alcohol   Types: 1 Cans of beer per week    Comment: occassional beer   Drug use: Yes    Frequency: 7.0 times per week    Types: Marijuana    Comment: Endorsed heroin 06/2014, UDS also + benzos, opiates, THC, neg for cocaine at that time.   Sexual activity: Not Currently  Other Topics Concern   Not on file  Social History Narrative   Lives at home    Has kids and grandkids   Smoker x 55 years 1 ppd as of 07/2019 he did quit x 2 years    Drinks 1 pt liq qd as of 07/2019    Social Drivers of  Health   Financial Resource Strain: High Risk (02/08/2024)   Overall Financial Resource Strain (CARDIA)    Difficulty of Paying Living Expenses: Very hard  Food Insecurity: Food Insecurity Present (02/05/2024)   Hunger Vital Sign    Worried About Running Out of Food in the Last Year: Sometimes true    Ran Out of Food in the Last Year: Sometimes true  Transportation Needs: No Transportation Needs (02/05/2024)   PRAPARE - Administrator, Civil Service (Medical): No    Lack of Transportation (Non-Medical): No  Physical Activity: Inactive (12/25/2017)   Exercise Vital Sign    Days of Exercise per Week: 0 days    Minutes of Exercise per Session: 0 min  Stress: No Stress Concern Present (02/08/2024)   [employer] of Occupational Health - Occupational Stress Questionnaire    Feeling of Stress: Not at all  Social Connections: Socially Isolated (01/31/2024)   Social Connection and Isolation Panel    Frequency of Communication with Friends and Family: More than three times a week    Frequency of Social Gatherings with Friends and Family: Once a week    Attends Religious Services: Never    Database administrator or Organizations: No    Attends Banker Meetings: Never    Marital Status: Divorced  Catering manager Violence: Not At Risk (02/05/2024)   Humiliation, Afraid, Rape, and Kick questionnaire    Fear of Current or Ex-Partner: No    Emotionally Abused: No    Physically Abused: No    Sexually Abused: No      Family History  Problem Relation Age of Onset   Heart disease Father        Unclear details. Father passed in late 39's 2/2 cancer.   Colon cancer Father    Alcohol  abuse Sister    Drug abuse Sister    Anxiety disorder Sister    Depression Sister    COPD Brother    Obesity Brother    Kidney disease Neg Hx    Prostate cancer Neg Hx    Vitals:   02/09/24 1538 02/09/24 1539  BP: (!) 89/59 (!) 93/58  Pulse: 73   SpO2: 96%   Weight: 214 lb (97.1  kg)    Wt Readings from Last 3 Encounters:  02/09/24 214 lb (97.1 kg)  02/08/24 217 lb (98.4 kg)  02/05/24 217 lb (98.4 kg)   Lab Results  Component Value Date   CREATININE 1.21 02/04/2024   CREATININE 1.10 02/03/2024   CREATININE 0.97 02/02/2024    PHYSICAL EXAM:  General: Elderly male in wheelchair. No resp difficulty HEENT: normal Neck: supple, no JVD Cor: Regular rhythm, rate. No rubs, gallops or murmurs Lungs: clear Abdomen: soft, nontender, nondistended. Extremities: no cyanosis, clubbing, rash, edema Neuro: alert & oriented X 3. Moves  all 4 extremities w/o difficulty. Affect pleasant   ECG: NSR, HR 73 (personally reviewed)   ASSESSMENT & PLAN:  1: NICM with reduced ejection fraction- - suspect due to AF as LHC showed no culprit lesion - NYHA class II - euvolemic - scales given and instructed to weight daily and parameters to call about reviewed - Echo in 07/2014 with LVEF of 55%.  - Echo in 04/2018 with normal LVEF. - Echo 11/2020 and 09/2021 with normal LVEF and grade I diastolic dysfunction. - Echo 02/02/24: LVEF of 25-30%, moderate LVH, mild Christopher - continue farxiga  10mg  daily - continue digoxin  0.125mg  daily ; will get dig level - change furosemide  to 20mg  daily PRN for above weight gain/ edema. Hopefully this will also help raise BP - stop losartan  due to BP - current BP will not tolerate GDMT titration  2: HTN- - BP 89/59, repeat 93/58 - stopping losartan  and changing furosemide  to PRN - emphasized making sure he's drinking enough fluids (60-64 oz) - saw PCP Simmons-Robinson (07/25) & midodrine  was increased to 10mg  daily due to hypotension (hasn't taken his 2nd dose yet today) - BMET 02/04/24 reviewed: sodium 138, potassium 3.8, creatinine 1.21 & GFR >60  3: CAD- - s/p PCI with DES to LAD in 2006 - saw cardiology [name]; returns 07/25 - R/LHC 02/01/24 and no culprit lesion was found. RHC showed RA of 3, mPAP of 19, PCWP of 12, CO of 5.45, CI of  2.49.  4: Hyperlipidemia- - continue rosuvastatin  10mg  daily - LDL 02/01/24 was 32 - lipo (a) 02/01/24 was 36.8  5: AF- - EKG today is NSR - continue amiodarone  200mg  BID till 02/14/24, then decrease down to 200mg  daily - continue apixaban  5mg  BID  6: Tobacco use- - smoking 1/2 ppd of cigarettes - complete cessation discussed  7: Myopericarditis- - finishing colchicine  RX - no further chest pain    Return in 2 weeks to see HF MD, sooner if needed.   Ellouise DELENA Class, FNP 02/09/24

## 2024-02-11 ENCOUNTER — Other Ambulatory Visit: Payer: Self-pay | Admitting: Family Medicine

## 2024-02-11 ENCOUNTER — Telehealth: Payer: Self-pay | Admitting: Family Medicine

## 2024-02-11 ENCOUNTER — Other Ambulatory Visit: Payer: Self-pay

## 2024-02-11 DIAGNOSIS — E785 Hyperlipidemia, unspecified: Secondary | ICD-10-CM

## 2024-02-11 DIAGNOSIS — R4189 Other symptoms and signs involving cognitive functions and awareness: Secondary | ICD-10-CM

## 2024-02-11 DIAGNOSIS — E861 Hypovolemia: Secondary | ICD-10-CM

## 2024-02-11 DIAGNOSIS — K219 Gastro-esophageal reflux disease without esophagitis: Secondary | ICD-10-CM
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
## 2024-02-11 NOTE — Transitions of Care (Post Inpatient/ED Visit) (Signed)
 Transition of Care week 2  Visit Note  02/11/2024  Name: Christopher Orr MRN: [id number]          DOB: Dec 14, 1945  Situation: Patient enrolled in [facility] 30-day program. Visit completed with [facility], the patient's daughter by telephone.   Past Medical History:  Diagnosis Date   Abnormal findings on diagnostic imaging of digestive system    Acute kidney injury (HCC) 07/28/2014   Alcohol  abuse    pt states it is marijuana   Anemia    Arthritis    Arthritis 08/15/2019   Aspiration pneumonia (HCC) 07/28/2014   Bilateral inguinal hernia, without obstruction or gangrene, recurrent    Bipolar disorder (HCC)    BPH (benign prostatic hypertrophy) with urinary obstruction    CAD (coronary artery disease)    a. 2006 PCI/DES to LAD, EF 60%.   Chest pain 07/28/2014   Chronic heart failure with preserved ejection fraction (HFpEF) (HCC)    a. 07/2014 Echo: EF 55%; b. 11/2020 Echo: EF 50-55%, mod LVH, GrI DD, mildly enlarged RV w/ nl fxn, mild BAE.   Chronic pancreatitis (HCC)    Chronic viral hepatitis C (HCC) 09/24/2014   Overview:   GT 1a.   started on Harvoni 11/03/2014 for planned 12 week course, elastography shows cirrhosis.   Cigarette smoker 08/05/2018   Cirrhosis (HCC)    Cirrhosis of liver (HCC) 08/15/2019   Noted in 2016 imaging with alcohol  abuse and h/o hep C tx s/p harvoni       COPD (chronic obstructive pulmonary disease) (HCC)    Coronary artery disease involving native heart with angina pectoris (HCC) 05/31/2018   COVID-19    08/22/20   Dementia (HCC)    early dementia   Depression    Depression, recurrent (HCC) 06/14/2018   Dilated ascending aorta and aortic root (HCC)    a. 11/2020 Echo: Ao root 44mm, Asc Ao 40mm. Ao arch 37mm.   Diverticulitis    12/06/20 [facility]    Diverticulosis    Drug use    Dyspnea    ED (erectile dysfunction)    Elevated troponin I level 07/28/2014   Gastritis    GERD (gastroesophageal reflux disease)    Headache    occasional migraines    Hepatitis C    treated   Hernia, inguinal, bilateral 08/15/2019   Recurrent s/p repair       History of lumbar fusion    History of pancreatitis 08/15/2019   X 2    History of prostate cancer 08/15/2019   2017 s/p brachytherapy with seeds   F/u Dr. Penne       Hyperlipidemia    Hypertension    patient denies having high blood pressure   Internal hemorrhoids 01/29/2021   Leg edema 01/03/2022   Lung cancer (HCC)    Malignant neoplasm of lower lobe of left lung (HCC) 05/15/2020   Memory loss 08/15/2019   Nodule of lower lobe of left lung 11/09/2019   Nodule of middle lobe of right lung 08/15/2019   Other chronic pain 01/29/2021   Pancreatitis    x 2    Pneumonia    07/06/18    Polyp of transverse colon    Poor nutrition 06/09/2022   Premature atrial contractions    a. 04/2021 Zio: Freq PACs w/ 8% burden.   Prostate cancer [facility])    with seeds   PSVT (paroxysmal supraventricular tachycardia) (HCC)    a. 04/2021 Zio: Avg HR 80 (60-210). 42 SVT runs -  longest 5 beats @ 116, fastest 210 x 5 beats. Freq PACs (8%).   PTSD (post-traumatic stress disorder)    Rib fracture    s/p fall 03/15/19    Right ear pain 07/18/2021   S/P lumbar fusion 12/27/2020   Sinus bradycardia 01/29/2021   Sinus disease    SOB (shortness of breath) on exertion 05/15/2020   Squamous carcinoma of lung, left (HCC) 11/26/2019   Syncope 01/29/2021   Syncope due to orthostatic hypotension 01/23/2021   Umbilical hernia without obstruction and without gangrene    Urge incontinence    Viral upper respiratory tract infection 07/18/2021    Assessment: Patient Reported Symptoms: Cognitive Cognitive Status: Difficulties with attention and concentration, Poor judgment in daily scenarios, Struggling with memory recall, Requires Assistance Decision Making      Neurological Neurological Review of Symptoms: No symptoms reported    HEENT HEENT Symptoms Reported: No symptoms reported      Cardiovascular  Cardiovascular Symptoms Reported: No symptoms reported Does patient have uncontrolled Hypertension?: No (His blood pressue is running low.) Cardiovascular Management Strategies: Medication therapy, Routine screening, Adequate rest Weight: 214 lb (97.1 kg)  Respiratory Respiratory Symptoms Reported: No symptoms reported    Endocrine Endocrine Symptoms Reported: No symptoms reported Is patient diabetic?: No    Gastrointestinal Gastrointestinal Symptoms Reported: No symptoms reported      Genitourinary Genitourinary Symptoms Reported: No symptoms reported    Integumentary Integumentary Symptoms Reported: No symptoms reported    Musculoskeletal Musculoskelatal Symptoms Reviewed: No symptoms reported   Falls in the past year?: Yes Number of falls in past year: 2 or more Was there an injury with Fall?: No Fall Risk Category Calculator: 2 Patient Fall Risk Level: Moderate Fall Risk Patient at Risk for Falls Due to: History of fall(s) Fall risk Follow up: Falls evaluation completed  Psychosocial Psychosocial Symptoms Reported: Not assessed     Do you feel physically threatened by others?: No   There were no vitals filed for this visit.  Medications Reviewed Today     Reviewed by Moises Reusing, RN (Case Manager) on 02/11/24 at 1427  Med List Status: <None>   Medication Order Taking? Sig Documenting Provider Last Dose Status Informant  amiodarone  (PACERONE ) 200 MG tablet [id number]  Take 1 tablet (200 mg total) by mouth 2 (two) times daily for 10 days, THEN 1 tablet (200 mg total) daily. Wouk, Devaughn Sayres, MD  Active   apixaban  (ELIQUIS ) 5 MG TABS tablet [id number]  Take 1 tablet (5 mg total) by mouth 2 (two) times daily. Wouk, Devaughn Sayres, MD  Active   ARIPiprazole  (ABILIFY ) 10 MG tablet [id number]  TAKE 1 TABLET BY MOUTH EVERY NIGHT AT BEDTIME Hope Merle, MD  Active Child, Pharmacy Records  colchicine  0.6 MG tablet [id number]  Take 1 tablet (0.6 mg total) by mouth 2 (two) times  daily. Wouk, Devaughn Sayres, MD  Active   dapagliflozin  propanediol (FARXIGA ) 10 MG TABS tablet [id number]  Take 1 tablet (10 mg total) by mouth daily. Wouk, Devaughn Sayres, MD  Active   digoxin  (LANOXIN ) 0.125 MG tablet [id number]  Take 1 tablet (0.125 mg total) by mouth daily. Wouk, Devaughn Sayres, MD  Active   donepezil  (ARICEPT ) 5 MG tablet [id number]  Take 1 tablet (5 mg total) by mouth at bedtime.  Patient not taking: Reported on 02/09/2024   Hope Merle, MD  Active Child, Pharmacy Records  famotidine  (PEPCID ) 20 MG tablet [id number]  TAKE 1 TABLET DAILY Hope Merle, MD  Active Child, Pharmacy Records  FLUoxetine  (PROZAC ) 10 MG capsule [id number]  TAKE 3 CAPSULES BY MOUTH DAILY  Patient taking differently: Take 10 mg by mouth daily. Take 10 mg by mouth along with one 20 mg capsule for total 30 mg once daily   Hope Merle, MD  Active Child, Pharmacy Records  FLUoxetine  (PROZAC ) 20 MG capsule [id number]  TAKE 1 CAPSULE BY MOUTH DAILY  Patient taking differently: Take 20 mg by mouth daily. Take 20 mg capsule by mouth along with one 10 mg capsule for total 30 mg once daily   Hope Merle, MD  Active Child, Pharmacy Records  furosemide  (LASIX ) 20 MG tablet [id number]  Take 1 tablet (20 mg total) by mouth daily. Wouk, Devaughn Sayres, MD  Active   memantine  (NAMENDA ) 10 MG tablet [id number]  Take 1 tablet (10 mg total) by mouth 2 (two) times daily. Hope Merle, MD  Active Child, Pharmacy Records  midodrine  (PROAMATINE ) 10 MG tablet [id number]  Take 1 tablet (10 mg total) by mouth 3 (three) times daily with meals. Simmons-Robinson, Makiera, MD  Active   mirabegron  ER (MYRBETRIQ ) 50 MG TB24 tablet [id number]  Take 1 tablet (50 mg total) by mouth daily. Hope Merle, MD  Active Child, Pharmacy Records  pantoprazole  (PROTONIX ) 40 MG tablet [id number]  TAKE 1 TABLET BY MOUTH EVERY IN THE MORNING 30 MINUTES BEFORE BREAKFAST Hope Merle, MD  Active Child, Pharmacy Records  rosuvastatin  (CRESTOR ) 10 MG tablet [id number]   TAKE 1 TABLET BY MOUTH AT BEDTIME Hope Merle, MD  Active Child, Pharmacy Records  sucralfate  (CARAFATE ) 1 g tablet [id number]  TAKE 1 TABLET BY MOUTH FOUR TIMES A MICHAE Hope Merle, MD  Active Child, Pharmacy Records  tamsulosin  (FLOMAX ) 0.4 MG CAPS capsule [id number]  TAKE 1 CAPSULE DAILY Simmons-Robinson, Makiera, MD  Active   traZODone  (DESYREL ) 100 MG tablet [id number]  TAKE 1 TABLET BY MOUTH AT BEDTIME AS NEEDED FOR SLEEP Hope Merle, MD  Active Child, Pharmacy Records            Recommendation:   Continue Current Plan of Care  Follow Up Plan:   Telephone follow-up in 1 week  Medford Balboa, BSN, RN [place]  VBCI - [facility] RN Care Manager [phone]

## 2024-02-11 NOTE — Telephone Encounter (Unsigned)
 Copied from CRM [id number]. Topic: Clinical - Medication Refill >> Feb 11, 2024  1:11 PM Montie POUR wrote: Medication:  apixaban  (ELIQUIS ) 5 MG TABS tablet  ARIPiprazole  (ABILIFY ) 10 MG tablet famotidine  (PEPCID ) 20 MG tablet FLUoxetine  (PROZAC ) 10 MG capsule  colchicine  0.6 MG tablet dapagliflozin  propanediol (FARXIGA ) 10 MG TABS tablet  digoxin  (LANOXIN ) 0.125 MG tablet  FLUoxetine  (PROZAC ) 20 MG capsule  furosemide  (LASIX ) 20 MG tablet  memantine  (NAMENDA ) 10 MG tablet  midodrine  (PROAMATINE ) 10 MG tablet mirabegron  ER (MYRBETRIQ ) 50 MG TB24 tablet pantoprazole  (PROTONIX ) 40 MG tablet rosuvastatin  (CRESTOR ) 10 MG tablet sucralfate  (CARAFATE ) 1 g tablet tamsulosin  (FLOMAX ) 0.4 MG CAPS capsule traZODone  (DESYREL ) 100 MG tablet  Has the patient contacted their pharmacy? Yes (Agent: If no, request that the patient contact the pharmacy for the refill. If patient does not wish to contact the pharmacy document the reason why and proceed with request.) (Agent: If yes, when and what did the pharmacy advise?) Pharmacy needs order to refill   This is the patient's preferred pharmacy:  [id number] - [address] [place] [address] Phone: [phone] Fax: [phone]  Is this the correct pharmacy for this prescription? Yes If no, delete pharmacy and type the correct one.   Has the prescription been filled recently? No  Is the patient out of the medication? No  Has the patient been seen for an appointment in the last year OR does the patient have an upcoming appointment? Yes  Can we respond through MyChart? No  Agent: Please be advised that Rx refills may take up to 3 business days. We ask that you follow-up with your pharmacy.

## 2024-02-11 NOTE — Telephone Encounter (Signed)
 Copied from CRM [id number]. Topic: Clinical - Medication Refill >> Feb 11, 2024  1:11 PM Montie POUR wrote: Medication:  apixaban  (ELIQUIS ) 5 MG TABS tablet  ARIPiprazole  (ABILIFY ) 10 MG tablet famotidine  (PEPCID ) 20 MG tablet FLUoxetine  (PROZAC ) 10 MG capsule  colchicine  0.6 MG tablet dapagliflozin  propanediol (FARXIGA ) 10 MG TABS tablet  digoxin  (LANOXIN ) 0.125 MG tablet  FLUoxetine  (PROZAC ) 20 MG capsule  furosemide  (LASIX ) 20 MG tablet  memantine  (NAMENDA ) 10 MG tablet  midodrine  (PROAMATINE ) 10 MG tablet mirabegron  ER (MYRBETRIQ ) 50 MG TB24 tablet pantoprazole  (PROTONIX ) 40 MG tablet rosuvastatin  (CRESTOR ) 10 MG tablet sucralfate  (CARAFATE ) 1 g tablet tamsulosin  (FLOMAX ) 0.4 MG CAPS capsule traZODone  (DESYREL ) 100 MG tablet  Has the patient contacted their pharmacy? Yes (Agent: If no, request that the patient contact the pharmacy for the refill. If patient does not wish to contact the pharmacy document the reason why and proceed with request.) (Agent: If yes, when and what did the pharmacy advise?) Pharmacy needs order to refill   This is the patient's preferred pharmacy:  [id number] - [address] [place] [address] Phone: [phone] Fax: [phone]  Is this the correct pharmacy for this prescription? Yes If no, delete pharmacy and type the correct one.   Has the prescription been filled recently? No  Is the patient out of the medication? No  Has the patient been seen for an appointment in the last year OR does the patient have an upcoming appointment? Yes  Can we respond through MyChart? No  Agent: Please be advised that Rx refills may take up to 3 business days. We ask that you follow-up with your pharmacy.

## 2024-02-12 MED ORDER — TRAZODONE HCL 100 MG PO TABS: 100.0000 mg | ORAL_TABLET | Freq: Every evening | ORAL | 1 refills | Status: AC | PRN

## 2024-02-12 MED ORDER — ROSUVASTATIN CALCIUM 10 MG PO TABS: 10.0000 mg | ORAL_TABLET | Freq: Every day | ORAL | 1 refills | Status: AC

## 2024-02-12 MED ORDER — MEMANTINE HCL 10 MG PO TABS: 10.0000 mg | ORAL_TABLET | Freq: Two times a day (BID) | ORAL | 3 refills | Status: AC

## 2024-02-12 MED ORDER — COLCHICINE 0.6 MG PO TABS: 0.6000 mg | ORAL_TABLET | Freq: Two times a day (BID) | ORAL | 1 refills | Status: DC

## 2024-02-12 MED ORDER — TAMSULOSIN HCL 0.4 MG PO CAPS: 0.4000 mg | ORAL_CAPSULE | Freq: Every day | ORAL | 3 refills | Status: AC

## 2024-02-12 MED ORDER — PANTOPRAZOLE SODIUM 40 MG PO TBEC: DELAYED_RELEASE_TABLET | ORAL | 1 refills | Status: DC

## 2024-02-12 MED ORDER — FUROSEMIDE 20 MG PO TABS: 20.0000 mg | ORAL_TABLET | Freq: Every day | ORAL | 1 refills | Status: DC

## 2024-02-12 MED ORDER — ARIPIPRAZOLE 10 MG PO TABS: ORAL_TABLET | ORAL | 2 refills | Status: AC

## 2024-02-12 MED ORDER — FLUOXETINE HCL 10 MG PO CAPS: 30.0000 mg | ORAL_CAPSULE | Freq: Every day | ORAL | 1 refills | Status: AC

## 2024-02-12 MED ORDER — MIDODRINE HCL 10 MG PO TABS
10.0000 mg | ORAL_TABLET | Freq: Three times a day (TID) | ORAL | 2 refills | Status: DC
Start: 2024-02-12 — End: 2024-06-03

## 2024-02-12 MED ORDER — APIXABAN 5 MG PO TABS: 5.0000 mg | ORAL_TABLET | Freq: Two times a day (BID) | ORAL | 1 refills | Status: AC

## 2024-02-12 MED ORDER — FAMOTIDINE 20 MG PO TABS: 20.0000 mg | ORAL_TABLET | Freq: Every day | ORAL | 1 refills | Status: DC

## 2024-02-12 MED ORDER — DAPAGLIFLOZIN PROPANEDIOL 10 MG PO TABS: 10.0000 mg | ORAL_TABLET | Freq: Every day | ORAL | 1 refills | Status: DC

## 2024-02-12 MED ORDER — SUCRALFATE 1 G PO TABS: 1.0000 g | ORAL_TABLET | Freq: Four times a day (QID) | ORAL | 2 refills | Status: DC

## 2024-02-12 NOTE — Telephone Encounter (Signed)
 LOV 02/08/24 NOV 03/14/2024 LRF  LABS 02/04/24

## 2024-02-12 NOTE — Telephone Encounter (Signed)
 Requested medications are due for refill today.  Pt wants all of the medications to go to a different pharmacy.  Requested medications are on the active medications list.  yes  Last refill. Varied  Future visit scheduled.   yes  Notes to clinic.  Most of these rxs are signed by another provider. Dr. Lang only signed 2, one is not delegated and 1 has expired labs.  Provider will need to review all of these for refill.    Requested Prescriptions  Pending Prescriptions Disp Refills   apixaban  (ELIQUIS ) 5 MG TABS tablet 60 tablet 1    Sig: Take 1 tablet (5 mg total) by mouth 2 (two) times daily.     Hematology:  Anticoagulants - apixaban  Failed - 02/12/2024  2:35 PM      Failed - HGB in normal range and within 360 days    Hemoglobin  Date Value Ref Range Status  02/04/2024 10.1 (L) 13.0 - 17.0 g/dL Final  95/95/7976 87.7 (L) 13.0 - 17.7 g/dL Final         Failed - HCT in normal range and within 360 days    HCT  Date Value Ref Range Status  02/04/2024 29.8 (L) 39.0 - 52.0 % Final   Hematocrit  Date Value Ref Range Status  10/29/2021 36.3 (L) 37.5 - 51.0 % Final         Passed - PLT in normal range and within 360 days    Platelets  Date Value Ref Range Status  02/04/2024 305 150 - 400 K/uL Final  10/29/2021 274 150 - 450 x10E3/uL Final         Passed - Cr in normal range and within 360 days    Creatinine, Ser  Date Value Ref Range Status  02/04/2024 1.21 0.61 - 1.24 mg/dL Final         Passed - AST in normal range and within 360 days    AST  Date Value Ref Range Status  02/19/2023 15 15 - 41 U/L Final         Passed - ALT in normal range and within 360 days    ALT  Date Value Ref Range Status  02/19/2023 16 0 - 44 U/L Final         Passed - Valid encounter within last 12 months    Recent Outpatient Visits           4 days ago NSTEMI (non-ST elevated myocardial infarction) [facility])   [place] [facility] Simmons-Robinson, [place], MD        Future Appointments             In 1 week Furth, Cadence H, PA-C [place] [facility]             colchicine  0.6 MG tablet 60 tablet 1    Sig: Take 1 tablet (0.6 mg total) by mouth 2 (two) times daily.     Endocrinology:  Gout Agents - colchicine  Failed - 02/12/2024  2:35 PM      Failed - CBC within normal limits and completed in the last 12 months    WBC  Date Value Ref Range Status  02/04/2024 9.2 4.0 - 10.5 K/uL Final   RBC  Date Value Ref Range Status  02/04/2024 3.23 (L) 4.22 - 5.81 MIL/uL Final   Hemoglobin  Date Value Ref Range Status  02/04/2024 10.1 (L) 13.0 - 17.0 g/dL Final  95/95/7976 87.7 (L) 13.0 - 17.7 g/dL Final  HCT  Date Value Ref Range Status  02/04/2024 29.8 (L) 39.0 - 52.0 % Final   Hematocrit  Date Value Ref Range Status  10/29/2021 36.3 (L) 37.5 - 51.0 % Final   MCHC  Date Value Ref Range Status  02/04/2024 33.9 30.0 - 36.0 g/dL Final   [facility]  Date Value Ref Range Status  02/04/2024 31.3 26.0 - 34.0 pg Final   MCV  Date Value Ref Range Status  02/04/2024 92.3 80.0 - 100.0 fL Final  10/29/2021 97 79 - 97 fL Final   No results found for: PLTCOUNTKUC, LABPLAT, POCPLA RDW  Date Value Ref Range Status  02/04/2024 14.4 11.5 - 15.5 % Final  10/29/2021 13.7 11.6 - 15.4 % Final         Passed - Cr in normal range and within 360 days    Creatinine, Ser  Date Value Ref Range Status  02/04/2024 1.21 0.61 - 1.24 mg/dL Final         Passed - ALT in normal range and within 360 days    ALT  Date Value Ref Range Status  02/19/2023 16 0 - 44 U/L Final         Passed - AST in normal range and within 360 days    AST  Date Value Ref Range Status  02/19/2023 15 15 - 41 U/L Final         Passed - Valid encounter within last 12 months    Recent Outpatient Visits           4 days ago NSTEMI (non-ST elevated myocardial infarction) [facility])   [facility] Simmons-Robinson, [place], MD        Future Appointments             In 1 week Furth, Cadence H, PA-C [place] [facility]             famotidine  (PEPCID ) 20 MG tablet 28 tablet 0    Sig: Take 1 tablet (20 mg total) by mouth daily.     Gastroenterology:  H2 Antagonists Passed - 02/12/2024  2:35 PM      Passed - Valid encounter within last 12 months    Recent Outpatient Visits           4 days ago NSTEMI (non-ST elevated myocardial infarction) [facility])   [place] [facility] Simmons-Robinson, Rockie, MD       Future Appointments             In 1 week Furth, Cadence H, PA-C [place] [facility] at [facility]             digoxin  (LANOXIN ) 0.125 MG tablet 30 tablet 1    Sig: Take 1 tablet (0.125 mg total) by mouth daily.     Cardiovascular:  Antiarrhythmic Agents - digoxin  Failed - 02/12/2024  2:35 PM      Failed - Digoxin  (serum) in normal range and within 360 days    No results found for: DIGOXIN        Failed - Ca in normal range and within 360 days    Calcium   Date Value Ref Range Status  02/04/2024 8.3 (L) 8.9 - 10.3 mg/dL Final   Calcium , Ion  Date Value Ref Range Status  02/01/2024 1.20 1.15 - 1.40 mmol/L Final         Passed - Cr in normal range and within 180 days    Creatinine, Ser  Date Value Ref Range Status  02/04/2024 1.21 0.61 - 1.24 mg/dL Final         Passed - K in normal range and within 180 days    Potassium  Date Value Ref Range Status  02/04/2024 3.8 3.5 - 5.1 mmol/L Final         Passed - Mg Level in normal range and within 360 days    Magnesium   Date Value Ref Range Status  02/04/2024 2.1 1.7 - 2.4 mg/dL Final    Comment:    Performed at [facility], [address] [place], [place] [address]         Passed - Patient had ECG in the last 360 days      Passed - Last Heart Rate in normal range    Pulse Readings from Last 1 Encounters:  02/09/24 73         Passed - Valid encounter within last 6 months    Recent  Outpatient Visits           4 days ago NSTEMI (non-ST elevated myocardial infarction) [facility])   [facility] [facility] Simmons-Robinson, [place], MD       Future Appointments             In 1 week Furth, Cadence H, PA-C [place] [facility]             donepezil  (ARICEPT ) 5 MG tablet 90 tablet 3    Sig: Take 1 tablet (5 mg total) by mouth at bedtime.     Neurology:  Alzheimer's Agents Passed - 02/12/2024  2:35 PM      Passed - Valid encounter within last 6 months    Recent Outpatient Visits           4 days ago NSTEMI (non-ST elevated myocardial infarction) [facility])   [facility] Simmons-Robinson, [place]ie, MD       Future Appointments             In 1 week Furth, Cadence H, PA-C [place] [facility]             FLUoxetine  (PROZAC ) 10 MG capsule 84 capsule 6    Sig: Take 3 capsules (30 mg total) by mouth daily.     Psychiatry:  Antidepressants - SSRI Passed - 02/12/2024  2:35 PM      Passed - Completed PHQ-2 or PHQ-9 in the last 360 days      Passed - Valid encounter within last 6 months    Recent Outpatient Visits           4 days ago NSTEMI (non-ST elevated myocardial infarction) [facility])   [facility] Simmons-Robinson, [place], MD       Future Appointments             In 1 week Furth, Cadence H, PA-C [place] [facility]             pantoprazole  (PROTONIX ) 40 MG tablet 28 tablet 0    Sig: TAKE 1 TABLET BY MOUTH EVERY IN THE MORNING 30 MINUTES BEFORE BREAKFAST     Gastroenterology: Proton Pump Inhibitors Passed - 02/12/2024  2:35 PM      Passed - Valid encounter within last 12 months    Recent Outpatient Visits           4 days ago NSTEMI (non-ST elevated myocardial infarction) [facility])   [place] [facility] Sharma [place], MD  Future Appointments             In 1 week Furth, Cadence H, PA-C [facility]             rosuvastatin  (CRESTOR ) 10 MG tablet 28 tablet 6    Sig: Take 1 tablet (10 mg total) by mouth at bedtime.     Cardiovascular:  Antilipid - Statins 2 Failed - 02/12/2024  2:35 PM      Failed - Lipid Panel in normal range within the last 12 months    Cholesterol  Date Value Ref Range Status  02/01/2024 78 0 - 200 mg/dL Final   LDL Cholesterol  Date Value Ref Range Status  02/01/2024 32 0 - 99 mg/dL Final    Comment:           Total Cholesterol/HDL:CHD Risk Coronary Heart Disease Risk Table                     Men   Women  1/2 Average Risk   3.4   3.3  Average Risk       5.0   4.4  2 X Average Risk   9.6   7.1  3 X Average Risk  23.4   11.0        Use the calculated Patient Ratio above and the CHD Risk Table to determine the patient's CHD Risk.        ATP III CLASSIFICATION (LDL):  <100     mg/dL   Optimal  899-870  mg/dL   Near or Above                    Optimal  130-159  mg/dL   Borderline  839-810  mg/dL   High  >809     mg/dL   Very High Performed at [facility], [address] [place], [place] [address]    HDL  Date Value Ref Range Status  02/01/2024 37 (L) >40 mg/dL Final   Triglycerides  Date Value Ref Range Status  02/01/2024 43 <150 mg/dL Final         Passed - Cr in normal range and within 360 days    Creatinine, Ser  Date Value Ref Range Status  02/04/2024 1.21 0.61 - 1.24 mg/dL Final         Passed - Patient is not pregnant      Passed - Valid encounter within last 12 months    Recent Outpatient Visits           4 days ago NSTEMI (non-ST elevated myocardial infarction) [facility])   [place] [facility] Simmons-Robinson, [place], MD       Future Appointments             In 1 week Furth, Cadence H, PA-C [place] [facility] [place]             sucralfate  (CARAFATE ) 1 g tablet 112 tablet 0    Sig: Take 1 tablet (1 g total) by mouth 4 (four) times daily.      Gastroenterology: Antiacids Passed - 02/12/2024  2:35 PM      Passed - Valid encounter within last 12 months    Recent Outpatient Visits           4 days ago NSTEMI (non-ST elevated myocardial infarction) [facility])   [place] [facility] Sharma [place], MD       Future Appointments  In 1 week Furth, [employer], PA-C [place] [facility]             tamsulosin  (FLOMAX ) 0.4 MG CAPS capsule 90 capsule 3    Sig: Take 1 capsule (0.4 mg total) by mouth daily.     Urology: Alpha-Adrenergic Blocker Failed - 02/12/2024  2:35 PM      Failed - PSA in normal range and within 360 days    PSA  Date Value Ref Range Status  10/02/2016 1.74 0.00 - 4.00 ng/mL Final    Comment:    (NOTE) While PSA levels of <=4.0 ng/ml are reported as reference range, some men with levels below 4.0 ng/ml can have prostate cancer and many men with PSA above 4.0 ng/ml do not have prostate cancer.  Other tests such as free PSA, age specific reference ranges, PSA velocity and PSA doubling time may be helpful especially in men less than 72 years old. Performed at [facility], [address], [place], [place] [address]    Prostate Specific Ag, Serum  Date Value Ref Range Status  03/28/2022 <0.1 0.0 - 4.0 ng/mL Final    Comment:    Roche ECLIA methodology. According to the American Urological Association, Serum PSA should decrease and remain at undetectable levels after radical prostatectomy. The AUA defines biochemical recurrence as an initial PSA value 0.2 ng/mL or greater followed by a subsequent confirmatory PSA value 0.2 ng/mL or greater. Values obtained with different assay methods or kits cannot be used interchangeably. Results cannot be interpreted as absolute evidence of the presence or absence of malignant disease.          Passed - Last BP in normal range    BP Readings from Last 1 Encounters:  02/09/24 (!) 93/58         Passed  - Valid encounter within last 12 months    Recent Outpatient Visits           4 days ago NSTEMI (non-ST elevated myocardial infarction) [facility])   [place] [facility] Simmons-Robinson, [place], MD       Future Appointments             In 1 week Furth, Cadence H, PA-C [place] [facility]             traZODone  (DESYREL ) 100 MG tablet 28 tablet 0    Sig: Take 1 tablet (100 mg total) by mouth at bedtime as needed. for sleep     Psychiatry: Antidepressants - Serotonin Modulator Passed - 02/12/2024  2:35 PM      Passed - Completed PHQ-2 or PHQ-9 in the last 360 days      Passed - Valid encounter within last 6 months    Recent Outpatient Visits           4 days ago NSTEMI (non-ST elevated myocardial infarction) [facility])   [place] [facility] Simmons-Robinson, [place], MD       Future Appointments             In 1 week Furth, Cadence H, PA-C [place] [facility]             furosemide  (LASIX ) 20 MG tablet 30 tablet 1    Sig: Take 1 tablet (20 mg total) by mouth daily.     Cardiovascular:  Diuretics - Loop Failed - 02/12/2024  2:35 PM      Failed - Ca in normal range and within  180 days    Calcium   Date Value Ref Range Status  02/04/2024 8.3 (L) 8.9 - 10.3 mg/dL Final   Calcium , Ion  Date Value Ref Range Status  02/01/2024 1.20 1.15 - 1.40 mmol/L Final         Passed - K in normal range and within 180 days    Potassium  Date Value Ref Range Status  02/04/2024 3.8 3.5 - 5.1 mmol/L Final         Passed - Na in normal range and within 180 days    Sodium  Date Value Ref Range Status  02/04/2024 138 135 - 145 mmol/L Final  10/29/2021 142 134 - 144 mmol/L Final         Passed - Cr in normal range and within 180 days    Creatinine, Ser  Date Value Ref Range Status  02/04/2024 1.21 0.61 - 1.24 mg/dL Final         Passed - Cl in normal range and within 180 days    Chloride  Date Value Ref Range  Status  02/04/2024 109 98 - 111 mmol/L Final         Passed - Mg Level in normal range and within 180 days    Magnesium   Date Value Ref Range Status  02/04/2024 2.1 1.7 - 2.4 mg/dL Final    Comment:    Performed at [facility], [address] [place], [place] [address]         Passed - Last BP in normal range    BP Readings from Last 1 Encounters:  02/09/24 (!) 93/58         Passed - Valid encounter within last 6 months    Recent Outpatient Visits           4 days ago NSTEMI (non-ST elevated myocardial infarction) [facility])   [place] [facility] Simmons-Robinson, [place], MD       Future Appointments             In 1 week Furth, Cadence H, PA-C [place] [facility]             memantine  (NAMENDA ) 10 MG tablet 180 tablet 3    Sig: Take 1 tablet (10 mg total) by mouth 2 (two) times daily.     Neurology:  Alzheimer's Agents 2 Passed - 02/12/2024  2:35 PM      Passed - Cr in normal range and within 360 days    Creatinine, Ser  Date Value Ref Range Status  02/04/2024 1.21 0.61 - 1.24 mg/dL Final         Passed - eGFR is 5 or above and within 360 days    GFR calc Af Amer  Date Value Ref Range Status  11/13/2019 >60 >60 mL/min Final   GFR, Estimated  Date Value Ref Range Status  02/04/2024 >60 >60 mL/min Final    Comment:    (NOTE) Calculated using the CKD-EPI Creatinine Equation (2021)    GFR  Date Value Ref Range Status  08/21/2022 69.19 >60.00 mL/min Final    Comment:    Calculated using the CKD-EPI Creatinine Equation (2021)   eGFR  Date Value Ref Range Status  10/29/2021 76 >59 mL/min/1.73 Final         Passed - Valid encounter within last 6 months    Recent Outpatient Visits           4 days ago NSTEMI (non-ST elevated myocardial infarction) (HCC)  [facility] Simmons-Robinson, [place], MD       Future Appointments             In 1 week Furth, Cadence H, PA-C [facility]             midodrine  (PROAMATINE ) 10 MG tablet 90 tablet 2    Sig: Take 1 tablet (10 mg total) by mouth 3 (three) times daily with meals.     Not Delegated - Cardiovascular: Midodrine  Failed - 02/12/2024  2:35 PM      Failed - This refill cannot be delegated      Passed - Cr in normal range and within 360 days    Creatinine, Ser  Date Value Ref Range Status  02/04/2024 1.21 0.61 - 1.24 mg/dL Final         Passed - ALT in normal range and within 360 days    ALT  Date Value Ref Range Status  02/19/2023 16 0 - 44 U/L Final         Passed - AST in normal range and within 360 days    AST  Date Value Ref Range Status  02/19/2023 15 15 - 41 U/L Final         Passed - Last BP in normal range    BP Readings from Last 1 Encounters:  02/09/24 (!) 93/58         Passed - Valid encounter within last 12 months    Recent Outpatient Visits           4 days ago NSTEMI (non-ST elevated myocardial infarction) [facility])   [facility] Simmons-Robinson, [place], MD       Future Appointments             In 1 week Furth, Cadence H, PA-C [place] [facility]             mirabegron  ER (MYRBETRIQ ) 50 MG TB24 tablet 28 tablet 12    Sig: Take 1 tablet (50 mg total) by mouth daily.     Urology: Bladder Agents - mirabegron  Passed - 02/12/2024  2:35 PM      Passed - Cr in normal range and within 360 days    Creatinine, Ser  Date Value Ref Range Status  02/04/2024 1.21 0.61 - 1.24 mg/dL Final         Passed - ALT in normal range and within 360 days    ALT  Date Value Ref Range Status  02/19/2023 16 0 - 44 U/L Final         Passed - AST in normal range and within 360 days    AST  Date Value Ref Range Status  02/19/2023 15 15 - 41 U/L Final         Passed - eGFR is 15 or above and within 360 days    GFR calc Af Amer  Date Value Ref Range Status  11/13/2019 >60 >60 mL/min Final   GFR, Estimated  Date  Value Ref Range Status  02/04/2024 >60 >60 mL/min Final    Comment:    (NOTE) Calculated using the CKD-EPI Creatinine Equation (2021)    GFR  Date Value Ref Range Status  08/21/2022 69.19 >60.00 mL/min Final    Comment:    Calculated using the CKD-EPI Creatinine Equation (2021)   eGFR  Date Value Ref Range Status  10/29/2021 76 >59 mL/min/1.73 Final  Passed - Last BP in normal range    BP Readings from Last 1 Encounters:  02/09/24 (!) 93/58         Passed - Valid encounter within last 12 months    Recent Outpatient Visits           4 days ago NSTEMI (non-ST elevated myocardial infarction) [facility])   [facility] Simmons-Robinson, [place], MD       Future Appointments             In 1 week Furth, Cadence H, PA-C [place] [facility]             dapagliflozin  propanediol (FARXIGA ) 10 MG TABS tablet 30 tablet 1    Sig: Take 1 tablet (10 mg total) by mouth daily.     Endocrinology:  Diabetes - SGLT2 Inhibitors Failed - 02/12/2024  2:35 PM      Failed - HBA1C is between 0 and 7.9 and within 180 days    Hgb A1c MFr Bld  Date Value Ref Range Status  04/08/2022 6.1 4.6 - 6.5 % Final    Comment:    Glycemic Control Guidelines for People with Diabetes:Non Diabetic:  <6%Goal of Therapy: <7%Additional Action Suggested:  >8%          Passed - Cr in normal range and within 360 days    Creatinine, Ser  Date Value Ref Range Status  02/04/2024 1.21 0.61 - 1.24 mg/dL Final         Passed - eGFR in normal range and within 360 days    GFR calc Af Amer  Date Value Ref Range Status  11/13/2019 >60 >60 mL/min Final   GFR, Estimated  Date Value Ref Range Status  02/04/2024 >60 >60 mL/min Final    Comment:    (NOTE) Calculated using the CKD-EPI Creatinine Equation (2021)    GFR  Date Value Ref Range Status  08/21/2022 69.19 >60.00 mL/min Final    Comment:    Calculated using the CKD-EPI Creatinine Equation (2021)    eGFR  Date Value Ref Range Status  10/29/2021 76 >59 mL/min/1.73 Final         Passed - Valid encounter within last 6 months    Recent Outpatient Visits           4 days ago NSTEMI (non-ST elevated myocardial infarction) [facility])   [place] [facility] Simmons-Robinson, [place], MD       Future Appointments             In 1 week Furth, Cadence H, PA-C [place] [facility]             ARIPiprazole  (ABILIFY ) 10 MG tablet 28 tablet 12    Sig: TAKE 1 TABLET BY MOUTH EVERY NIGHT AT BEDTIME     Not Delegated - Psychiatry:  Antipsychotics - Second Generation (Atypical) - aripiprazole  Failed - 02/12/2024  2:35 PM      Failed - This refill cannot be delegated      Failed - Lipid Panel in normal range within the last 12 months    Cholesterol  Date Value Ref Range Status  02/01/2024 78 0 - 200 mg/dL Final   LDL Cholesterol  Date Value Ref Range Status  02/01/2024 32 0 - 99 mg/dL Final    Comment:           Total Cholesterol/HDL:CHD Risk Coronary Heart Disease Risk Table  Men   Women  1/2 Average Risk   3.4   3.3  Average Risk       5.0   4.4  2 X Average Risk   9.6   7.1  3 X Average Risk  23.4   11.0        Use the calculated Patient Ratio above and the CHD Risk Table to determine the patient's CHD Risk.        ATP III CLASSIFICATION (LDL):  <100     mg/dL   Optimal  899-870  mg/dL   Near or Above                    Optimal  130-159  mg/dL   Borderline  839-810  mg/dL   High  >809     mg/dL   Very High Performed at [facility], [address] [place], [place] [address]    HDL  Date Value Ref Range Status  02/01/2024 37 (L) >40 mg/dL Final   Triglycerides  Date Value Ref Range Status  02/01/2024 43 <150 mg/dL Final         Failed - CBC within normal limits and completed in the last 12 months    WBC  Date Value Ref Range Status  02/04/2024 9.2 4.0 - 10.5 K/uL Final   RBC  Date Value Ref  Range Status  02/04/2024 3.23 (L) 4.22 - 5.81 MIL/uL Final   Hemoglobin  Date Value Ref Range Status  02/04/2024 10.1 (L) 13.0 - 17.0 g/dL Final  95/95/7976 87.7 (L) 13.0 - 17.7 g/dL Final   HCT  Date Value Ref Range Status  02/04/2024 29.8 (L) 39.0 - 52.0 % Final   Hematocrit  Date Value Ref Range Status  10/29/2021 36.3 (L) 37.5 - 51.0 % Final   MCHC  Date Value Ref Range Status  02/04/2024 33.9 30.0 - 36.0 g/dL Final   [facility]  Date Value Ref Range Status  02/04/2024 31.3 26.0 - 34.0 pg Final   MCV  Date Value Ref Range Status  02/04/2024 92.3 80.0 - 100.0 fL Final  10/29/2021 97 79 - 97 fL Final   No results found for: PLTCOUNTKUC, LABPLAT, POCPLA RDW  Date Value Ref Range Status  02/04/2024 14.4 11.5 - 15.5 % Final  10/29/2021 13.7 11.6 - 15.4 % Final         Passed - TSH in normal range and within 360 days    TSH  Date Value Ref Range Status  02/02/2024 0.620 0.350 - 4.500 uIU/mL Final    Comment:    Performed by a 3rd Generation assay with a functional sensitivity of <=0.01 uIU/mL. Performed at [facility], [address] [place], [place] [address]   05/27/2022 0.48 0.35 - 5.50 uIU/mL Final         Passed - Completed PHQ-2 or PHQ-9 in the last 360 days      Passed - Last BP in normal range    BP Readings from Last 1 Encounters:  02/09/24 (!) 93/58         Passed - Last Heart Rate in normal range    Pulse Readings from Last 1 Encounters:  02/09/24 73         Passed - Valid encounter within last 6 months    Recent Outpatient Visits           4 days ago NSTEMI (non-ST elevated myocardial infarction) [facility])   [place] [facility]  Simmons-Robinson, [place], MD       Future Appointments             In 1 week Furth, Cadence H, PA-C [place] [facility] - CMP within normal limits and completed in the last 12 months    Albumin  Date Value Ref Range Status  02/19/2023 3.6 3.5  - 5.0 g/dL Final   Alkaline Phosphatase  Date Value Ref Range Status  02/19/2023 77 38 - 126 U/L Final   ALT  Date Value Ref Range Status  02/19/2023 16 0 - 44 U/L Final   AST  Date Value Ref Range Status  02/19/2023 15 15 - 41 U/L Final   BUN  Date Value Ref Range Status  02/04/2024 17 8 - 23 mg/dL Final  95/95/7976 18 8 - 27 mg/dL Final   Calcium   Date Value Ref Range Status  02/04/2024 8.3 (L) 8.9 - 10.3 mg/dL Final   Calcium , Ion  Date Value Ref Range Status  02/01/2024 1.20 1.15 - 1.40 mmol/L Final   CO2  Date Value Ref Range Status  02/04/2024 20 (L) 22 - 32 mmol/L Final   Bicarbonate  Date Value Ref Range Status  02/01/2024 22.6 20.0 - 28.0 mmol/L Final   TCO2  Date Value Ref Range Status  02/01/2024 24 22 - 32 mmol/L Final   Creatinine, Ser  Date Value Ref Range Status  02/04/2024 1.21 0.61 - 1.24 mg/dL Final   Glucose, Bld  Date Value Ref Range Status  02/04/2024 133 (H) 70 - 99 mg/dL Final    Comment:    Glucose reference range applies only to samples taken after fasting for at least 8 hours.   Glucose-Capillary  Date Value Ref Range Status  01/31/2024 113 (H) 70 - 99 mg/dL Final    Comment:    Glucose reference range applies only to samples taken after fasting for at least 8 hours.   Potassium  Date Value Ref Range Status  02/04/2024 3.8 3.5 - 5.1 mmol/L Final   Sodium  Date Value Ref Range Status  02/04/2024 138 135 - 145 mmol/L Final  10/29/2021 142 134 - 144 mmol/L Final   Total Bilirubin  Date Value Ref Range Status  02/19/2023 0.4 0.3 - 1.2 mg/dL Final   Bilirubin, Direct  Date Value Ref Range Status  10/30/2020 0.2 0.0 - 0.2 mg/dL Final   Indirect Bilirubin  Date Value Ref Range Status  10/30/2020 0.7 0.3 - 0.9 mg/dL Final    Comment:    Performed at [facility], [address] [place], [place] [address]   Protein, ur  Date Value Ref Range Status  02/19/2023 NEGATIVE NEGATIVE mg/dL Final   Total Protein   Date Value Ref Range Status  02/19/2023 7.1 6.5 - 8.1 g/dL Final   GFR calc Af Amer  Date Value Ref Range Status  11/13/2019 >60 >60 mL/min Final   GFR  Date Value Ref Range Status  08/21/2022 69.19 >60.00 mL/min Final    Comment:    Calculated using the CKD-EPI Creatinine Equation (2021)   eGFR  Date Value Ref Range Status  10/29/2021 76 >59 mL/min/1.73 Final   GFR, Estimated  Date Value Ref Range Status  02/04/2024 >60 >60 mL/min Final    Comment:    (NOTE) Calculated using the CKD-EPI Creatinine Equation (2021)

## 2024-02-16 MED ORDER — MIRABEGRON ER 50 MG PO TB24: 50.0000 mg | ORAL_TABLET | Freq: Every day | ORAL | 12 refills | Status: AC

## 2024-02-18 ENCOUNTER — Other Ambulatory Visit: Payer: Self-pay

## 2024-02-19 ENCOUNTER — Other Ambulatory Visit (HOSPITAL_COMMUNITY): Payer: Self-pay

## 2024-02-23 ENCOUNTER — Telehealth: Payer: Self-pay | Admitting: Cardiology

## 2024-02-23 NOTE — Telephone Encounter (Signed)
 Called to confirm/remind patient of their appointment at the [facility] on 02/24/24.   Appointment:   [] Confirmed  [x] Left mess   [] No answer/No voice mail  [] VM Full/unable to leave message  [] Phone not in service  Patient reminded to bring all medications and/or complete list.  Confirmed patient has transportation. Gave directions, instructed to utilize valet parking.

## 2024-02-24 ENCOUNTER — Ambulatory Visit: Admitting: Medical

## 2024-02-24 ENCOUNTER — Encounter: Payer: Self-pay | Admitting: Cardiology

## 2024-02-24 ENCOUNTER — Other Ambulatory Visit: Payer: Self-pay

## 2024-02-24 ENCOUNTER — Encounter: Admitting: Cardiology

## 2024-02-24 ENCOUNTER — Ambulatory Visit: Attending: Cardiology | Admitting: Cardiology

## 2024-02-24 VITALS — BP 103/66 | HR 77 | Wt 211.6 lb

## 2024-02-24 DIAGNOSIS — E785 Hyperlipidemia, unspecified: Secondary | ICD-10-CM | POA: Diagnosis not present

## 2024-02-24 DIAGNOSIS — I5181 Takotsubo syndrome: Secondary | ICD-10-CM

## 2024-02-24 DIAGNOSIS — I5022 Chronic systolic (congestive) heart failure: Secondary | ICD-10-CM | POA: Insufficient documentation

## 2024-02-24 DIAGNOSIS — Z7902 Long term (current) use of antithrombotics/antiplatelets: Secondary | ICD-10-CM | POA: Diagnosis not present

## 2024-02-24 DIAGNOSIS — I2510 Atherosclerotic heart disease of native coronary artery without angina pectoris: Secondary | ICD-10-CM | POA: Insufficient documentation

## 2024-02-24 DIAGNOSIS — J449 Chronic obstructive pulmonary disease, unspecified: Secondary | ICD-10-CM | POA: Insufficient documentation

## 2024-02-24 DIAGNOSIS — I319 Disease of pericardium, unspecified: Secondary | ICD-10-CM | POA: Diagnosis not present

## 2024-02-24 DIAGNOSIS — I428 Other cardiomyopathies: Secondary | ICD-10-CM | POA: Diagnosis not present

## 2024-02-24 DIAGNOSIS — N189 Chronic kidney disease, unspecified: Secondary | ICD-10-CM | POA: Diagnosis not present

## 2024-02-24 DIAGNOSIS — I10 Essential (primary) hypertension: Secondary | ICD-10-CM | POA: Diagnosis not present

## 2024-02-24 DIAGNOSIS — F17210 Nicotine dependence, cigarettes, uncomplicated: Secondary | ICD-10-CM | POA: Insufficient documentation

## 2024-02-24 DIAGNOSIS — Z955 Presence of coronary angioplasty implant and graft: Secondary | ICD-10-CM | POA: Insufficient documentation

## 2024-02-24 DIAGNOSIS — F0390 Unspecified dementia without behavioral disturbance: Secondary | ICD-10-CM | POA: Insufficient documentation

## 2024-02-24 DIAGNOSIS — Z79899 Other long term (current) drug therapy: Secondary | ICD-10-CM | POA: Insufficient documentation

## 2024-02-24 DIAGNOSIS — I130 Hypertensive heart and chronic kidney disease with heart failure and stage 1 through stage 4 chronic kidney disease, or unspecified chronic kidney disease: Secondary | ICD-10-CM | POA: Insufficient documentation
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
## 2024-02-24 NOTE — Progress Notes (Deleted)
  Cardiology Office Note   Date:  02/24/2024  ID:  UCHENNA Orr, DOB 1946-01-13, MRN [id number] PCP: Sharma Coyer, MD  [facility] Providers Cardiologist:  Deatrice Cage, MD Electrophysiologist:  OLE ONEIDA HOLTS, MD { Click to update primary MD,subspecialty MD or APP then REFRESH:1}    History of Present Illness Christopher Orr is a 78 y.o. male with a history of single-vessel CAD with LAD PCI back in 2006- patent by cath in 2011, chronic HFpEF, COPD, hypertension, hyperlipidemia, alcohol  abuse, dementia/bipolar disorder who is being seen for hospital follow-up.  The patient was admitted in early July 2025 with non-STEMI.  High-sensitivity troponin went up to 4000.  Original plan was to admit and treat with IV heparin  and plan for cardiac cath however patient had ongoing chest discomfort with borderline hypotension and he was urgently taken to cardiac cath.  Cardiac cath showed no significant CAD, felt chest pain was from pericarditis.  He was started on colchicine  and ibuprofen .  Blood pressure improved with IV fluids.  Echo showed EF of 25 to 30%, unable to exclude stress cardiomyopathy.  ROS: ***  Studies Reviewed      *** Risk Assessment/Calculations {Does this patient have ATRIAL FIBRILLATION?[phone]} No BP recorded.  {Refresh Note OR Click here to enter BP  :1}***       Physical Exam VS:  There were no vitals taken for this visit.       Wt Readings from Last 3 Encounters:  02/11/24 214 lb (97.1 kg)  02/09/24 214 lb (97.1 kg)  02/08/24 217 lb (98.4 kg)    GEN: Well nourished, well developed in no acute distress NECK: No JVD; No carotid bruits CARDIAC: ***RRR, no murmurs, rubs, gallops RESPIRATORY:  Clear to auscultation without rales, wheezing or rhonchi  ABDOMEN: Soft, non-tender, non-distended EXTREMITIES:  No edema; No deformity   ASSESSMENT AND PLAN ***    {Are you ordering a CV Procedure (e.g. stress test, cath, DCCV, TEE, etc)?    Press F2        :[id number]}  Dispo: ***  Signed, Aksh Swart VEAR Fishman, PA-C

## 2024-02-24 NOTE — Patient Instructions (Signed)
 Medication Changes:  STOP Clopidogrel   STOP Farxiga   STOP Furosemide   CONTINUE Amiodarone  and Eliquis   Please bring either the pill bottles that you are taking or the medication list you are using to fill the pill box to you next appointment.    Follow-Up in: Please follow up with the [facility] in 2 months with Ellouise Class, FNP.   Thank you for choosing [facility].    At the [facility], you and your health needs are our priority. We have a designated team specialized in the treatment of Heart Failure. This Care Team includes your primary Heart Failure Specialized Cardiologist (physician), Advanced Practice Providers (APPs- Physician Assistants and Nurse Practitioners), and Pharmacist who all work together to provide you with the care you need, when you need it.   You may see any of the following providers on your designated Care Team at your next follow up:  Dr. Toribio Fuel Dr. Ezra Shuck Dr. Ria Commander Dr. Morene Brownie Ellouise Class, FNP [name], RPH-CPP  Please be sure to bring in all your medications bottles to every appointment.   Need to Contact Us :  If you have any questions or concerns before your next appointment please send us  a message through [place] or call our office at [phone].    TO LEAVE A MESSAGE FOR THE NURSE SELECT OPTION 2, PLEASE LEAVE A MESSAGE INCLUDING: YOUR NAME DATE OF BIRTH CALL BACK NUMBER REASON FOR CALL**this is important as we prioritize the call backs  YOU WILL RECEIVE A CALL BACK THE SAME DAY AS LONG AS YOU CALL BEFORE 4:00 PM

## 2024-02-24 NOTE — Transitions of Care (Post Inpatient/ED Visit) (Signed)
 Transition of Care week 3  Visit Note  02/24/2024  Name: Christopher Orr MRN: [id number]          DOB: 12/04/1945  Situation: Patient enrolled in [facility] 30-day program. Visit completed with Augustin Pitman by telephone.   Background:   Past Medical History:  Diagnosis Date   Abnormal findings on diagnostic imaging of digestive system    Acute kidney injury (HCC) 07/28/2014   Alcohol  abuse    pt states it is marijuana   Anemia    Arthritis    Arthritis 08/15/2019   Aspiration pneumonia (HCC) 07/28/2014   Bilateral inguinal hernia, without obstruction or gangrene, recurrent    Bipolar disorder (HCC)    BPH (benign prostatic hypertrophy) with urinary obstruction    CAD (coronary artery disease)    a. 2006 PCI/DES to LAD, EF 60%.   Chest pain 07/28/2014   Chronic heart failure with preserved ejection fraction (HFpEF) (HCC)    a. 07/2014 Echo: EF 55%; b. 11/2020 Echo: EF 50-55%, mod LVH, GrI DD, mildly enlarged RV w/ nl fxn, mild BAE.   Chronic pancreatitis (HCC)    Chronic viral hepatitis C (HCC) 09/24/2014   Overview:   GT 1a.   started on Harvoni 11/03/2014 for planned 12 week course, elastography shows cirrhosis.   Cigarette smoker 08/05/2018   Cirrhosis (HCC)    Cirrhosis of liver (HCC) 08/15/2019   Noted in 2016 imaging with alcohol  abuse and h/o hep C tx s/p harvoni       COPD (chronic obstructive pulmonary disease) (HCC)    Coronary artery disease involving native heart with angina pectoris (HCC) 05/31/2018   COVID-19    08/22/20   Dementia (HCC)    early dementia   Depression    Depression, recurrent (HCC) 06/14/2018   Dilated ascending aorta and aortic root (HCC)    a. 11/2020 Echo: Ao root 44mm, Asc Ao 40mm. Ao arch 37mm.   Diverticulitis    12/06/20 [facility]    Diverticulosis    Drug use    Dyspnea    ED (erectile dysfunction)    Elevated troponin I level 07/28/2014   Gastritis    GERD (gastroesophageal reflux disease)    Headache    occasional migraines   Hepatitis C     treated   Hernia, inguinal, bilateral 08/15/2019   Recurrent s/p repair       History of lumbar fusion    History of pancreatitis 08/15/2019   X 2    History of prostate cancer 08/15/2019   2017 s/p brachytherapy with seeds   F/u Dr. Penne       Hyperlipidemia    Hypertension    patient denies having high blood pressure   Internal hemorrhoids 01/29/2021   Leg edema 01/03/2022   Lung cancer (HCC)    Malignant neoplasm of lower lobe of left lung (HCC) 05/15/2020   Memory loss 08/15/2019   Nodule of lower lobe of left lung 11/09/2019   Nodule of middle lobe of right lung 08/15/2019   Other chronic pain 01/29/2021   Pancreatitis    x 2    Pneumonia    07/06/18    Polyp of transverse colon    Poor nutrition 06/09/2022   Premature atrial contractions    a. 04/2021 Zio: Freq PACs w/ 8% burden.   Prostate cancer [facility])    with seeds   PSVT (paroxysmal supraventricular tachycardia) (HCC)    a. 04/2021 Zio: Avg HR 80 (60-210). 42 SVT runs -  longest 5 beats @ 116, fastest 210 x 5 beats. Freq PACs (8%).   PTSD (post-traumatic stress disorder)    Rib fracture    s/p fall 03/15/19    Right ear pain 07/18/2021   S/P lumbar fusion 12/27/2020   Sinus bradycardia 01/29/2021   Sinus disease    SOB (shortness of breath) on exertion 05/15/2020   Squamous carcinoma of lung, left (HCC) 11/26/2019   Syncope 01/29/2021   Syncope due to orthostatic hypotension 01/23/2021   Umbilical hernia without obstruction and without gangrene    Urge incontinence    Viral upper respiratory tract infection 07/18/2021    Assessment: Patient Reported Symptoms: Cognitive Cognitive Status: Difficulties with attention and concentration, Requires Assistance Decision Making, Able to follow simple commands, Struggling with memory recall      Neurological Neurological Review of Symptoms: No symptoms reported    HEENT HEENT Symptoms Reported: No symptoms reported      Cardiovascular Cardiovascular Symptoms  Reported: No symptoms reported Cardiovascular Management Strategies: Medication therapy, Routine screening, Adequate rest Cardiovascular Comment: The patient has actually been feeling better this past week. He has been less lightheaded. He did not have his Midodrine  for about a week. The patient follows up with Cardiology today  Respiratory Respiratory Symptoms Reported: No symptoms reported    Endocrine Endocrine Symptoms Reported: No symptoms reported    Gastrointestinal Gastrointestinal Symptoms Reported: No symptoms reported      Genitourinary Genitourinary Symptoms Reported: Incontinence Genitourinary Comment: The patient smells of urine and does not wear incontinent garments. He knows when he has to go to the bathroom but he leaks urine.  Integumentary Integumentary Symptoms Reported: No symptoms reported    Musculoskeletal Musculoskelatal Symptoms Reviewed: Limited mobility Musculoskeletal Management Strategies: Routine screening Musculoskeletal Comment: Unsure if HHPT is coming to the home. The family doesn't always answer the phone and the patient does not have a phone      Psychosocial Psychosocial Symptoms Reported: Other Other Psychosocial Conditions: Dementia Additional Psychological Details: Talked with patient's daughter today, Augustin. She now has control over the patient's Debit Card and his fianances. She is able to control when money is needed for bills and groceries and help the patient get out of debt         There were no vitals filed for this visit.  Medications Reviewed Today     Reviewed by Moises Reusing, RN (Case Manager) on 02/24/24 at 1222  Med List Status: <None>   Medication Order Taking? Sig Documenting Provider Last Dose Status Informant  amiodarone  (PACERONE ) 200 MG tablet [id number]  Take 1 tablet (200 mg total) by mouth 2 (two) times daily for 10 days, THEN 1 tablet (200 mg total) daily. Wouk, Devaughn Sayres, MD  Active   apixaban  (ELIQUIS ) 5 MG  TABS tablet [id number]  Take 1 tablet (5 mg total) by mouth 2 (two) times daily. Simmons-Robinson, Makiera, MD  Active   ARIPiprazole  (ABILIFY ) 10 MG tablet [id number]  TAKE 1 TABLET BY MOUTH EVERY NIGHT AT BEDTIME Simmons-Robinson, Makiera, MD  Active   colchicine  0.6 MG tablet [id number]  Take 1 tablet (0.6 mg total) by mouth 2 (two) times daily. Simmons-Robinson, Makiera, MD  Active   dapagliflozin  propanediol (FARXIGA ) 10 MG TABS tablet [id number]  Take 1 tablet (10 mg total) by mouth daily. Simmons-Robinson, Makiera, MD  Active   digoxin  (LANOXIN ) 0.125 MG tablet [id number]  Take 1 tablet (0.125 mg total) by mouth daily. Wouk, Devaughn Sayres, MD  Active   donepezil  (ARICEPT ) 5 MG  tablet [id number]  Take 1 tablet (5 mg total) by mouth at bedtime.  Patient not taking: Reported on 02/09/2024   Hope Merle, MD  Active Child, Pharmacy Records  famotidine  (PEPCID ) 20 MG tablet [id number]  Take 1 tablet (20 mg total) by mouth daily. Simmons-Robinson, Makiera, MD  Active   FLUoxetine  (PROZAC ) 10 MG capsule [id number]  Take 3 capsules (30 mg total) by mouth daily. Simmons-Robinson, Makiera, MD  Active   FLUoxetine  (PROZAC ) 20 MG capsule [id number]  TAKE 1 CAPSULE BY MOUTH DAILY  Patient taking differently: Take 20 mg by mouth daily. Take 20 mg capsule by mouth along with one 10 mg capsule for total 30 mg once daily   Hope Merle, MD  Active Child, Pharmacy Records  furosemide  (LASIX ) 20 MG tablet [id number]  Take 1 tablet (20 mg total) by mouth daily. Simmons-Robinson, Makiera, MD  Active   memantine  (NAMENDA ) 10 MG tablet [id number]  Take 1 tablet (10 mg total) by mouth 2 (two) times daily. Simmons-Robinson, Makiera, MD  Active   midodrine  (PROAMATINE ) 10 MG tablet [id number]  Take 1 tablet (10 mg total) by mouth 3 (three) times daily with meals. Simmons-Robinson, Makiera, MD  Active   mirabegron  ER (MYRBETRIQ ) 50 MG TB24 tablet [id number]  Take 1 tablet (50 mg total) by mouth daily. Simmons-Robinson, Makiera, MD   Active   pantoprazole  (PROTONIX ) 40 MG tablet [id number]  TAKE 1 TABLET BY MOUTH EVERY IN THE MORNING 30 MINUTES BEFORE BREAKFAST Simmons-Robinson, Makiera, MD  Active   rosuvastatin  (CRESTOR ) 10 MG tablet [id number]  Take 1 tablet (10 mg total) by mouth at bedtime. Simmons-Robinson, Makiera, MD  Active   sucralfate  (CARAFATE ) 1 g tablet [id number]  Take 1 tablet (1 g total) by mouth 4 (four) times daily. Simmons-Robinson, Makiera, MD  Active   tamsulosin  (FLOMAX ) 0.4 MG CAPS capsule [id number]  Take 1 capsule (0.4 mg total) by mouth daily. Simmons-Robinson, Makiera, MD  Active   traZODone  (DESYREL ) 100 MG tablet [id number]  Take 1 tablet (100 mg total) by mouth at bedtime as needed. for sleep Sharma Coyer, MD  Active             Recommendation:   Specialty provider follow-up Dr. Zenaida 02/24/24 Continue Current Plan of Care  Follow Up Plan:   Telephone follow-up in 1 week  Medford Balboa, BSN, RN [place]  VBCI - [facility] RN Care Manager [phone]

## 2024-02-26 NOTE — Progress Notes (Signed)
 ADVANCED HEART FAILURE FOLLOW UP CLINIC NOTE  Referring Physician: Sharma Orr*  Primary Care: Christopher Coyer, MD Primary Cardiologist:  HPI: Christopher Orr is a 78 y.o. male with a PMH of COPD, CAD s/p PCI with DES to LAD in 2006, HTN, HLD, chronic heart failure, pericarditis, bipolar, alcohol / tobacco use, CKD, dementia, AF/ RVR, lung & prostate cancer who presents for follow up of chronic systolic heart failure.      Longstanding CAD with prior PCI to LAD in 2006. Echo in 07/2014 with LVEF of 55%. Echo in 04/2018 with normal LVEF. Echo 11/2020 and 09/2021 with normal LVEF and grade I diastolic dysfunction.   Admitted 01/31/24 with chest pain and shortness of breath. On admission, BNP was not measured, HS-troponin was 4107 > 3623, ESR 32, CRP 12, and lactic acid was 0.9. Chest x-ray noted no acute abnormality. ECG with AF RVR and hypotension. R/LHC 02/01/24 and no culprit lesion was found. RHC showed RA of 3, mPAP of 19, PCWP of 12, CO of 5.45, CI of 2.49. Echo 02/02/24: LVEF of 25-30%, moderate LVH, mild MR. Patient was diagnosed with myopericarditis and started on colchicine  and ibuprofen .     SUBJECTIVE:  Patient has not been taking his cardiac meds over the past few days and reports feeling much better. He is no longer dizzy or fatigued and his energy level has been better. He has only been taking his original medications at this point. His daughter does his medications and is mildly overwhelmed given that she has a lot of other responsibilities including multiple small children, we discussed the potential for home health.   PMH, current medications, allergies, social history, and family history reviewed in epic.  PHYSICAL EXAM: Vitals:   02/24/24 1427  BP: 103/66  Pulse: 77  SpO2: 95%   GENERAL: NDA PULM:  Normal work of breathing, clear to auscultation bilaterally. Respirations are unlabored.  CARDIAC:  JVP: flat         Normal rate with regular  rhythm. No murmurs, rubs or gallops.  No edema. Warm and well perfused extremities. ABDOMEN: Soft, non-tender, non-distended.  - Echo in 07/2014 with LVEF of 55%.  - Echo in 04/2018 with normal LVEF. - Echo 11/2020 and 09/2021 with normal LVEF and grade I diastolic dysfunction. - Echo 02/02/24: LVEF of 25-30%, moderate LVH, mild MR  ASSESSMENT & PLAN:  1: NICM with reduced ejection fraction- -  Likely tachyarrhythmia related though echo suspicious for takotsubo - NYHA class II - euvolemic to dry - Improving off cardiac medications as noted above, given his dementia, as well as suspicion that this is stress related. Given low filling pressures would avoid diuretics - Stop lasix , farxiga , may be able to restart SGLT2 at follow up - Stop digoxin  - Stop lasix  - Continue midodrine  10mg  TID, consider BB in the future - Close follow up - Home health   2: HTN- - BP 103/66, improved - Continue midodrine    3: CAD- - s/p PCI with DES to LAD in 2006 - saw cardiology Christopher Orr) 03/24; bumped general cardiology visit as it is the same day - [name] 02/01/24 and no culprit lesion was found. RHC showed RA of 3, mPAP of 19, PCWP of 12, CO of 5.45, CI of 2.49.   4: Hyperlipidemia- - continue rosuvastatin  10mg  daily - LDL 02/01/24 was 32 - lipo (a) 02/01/24 was 36.8   5: AF- - In NSR by exam - Restart amiodarone  200mg  daily, would benefit from sinus - Continue apxiaban  5mg  BID, stop plavix    6: Tobacco use- - smoking 1/2 ppd of cigarettes - complete cessation discussed   7: Myopericarditis- - finishing colchicine  RX - no further chest pain   Follow up in 2 months with APP, will reach out to determine social work options  Christopher Brownie, MD Advanced Heart Failure Mechanical Circulatory Support 02/26/24

## 2024-03-02 ENCOUNTER — Other Ambulatory Visit: Payer: Self-pay

## 2024-03-02 NOTE — Transitions of Care (Post Inpatient/ED Visit) (Signed)
 Transition of Care week 4  Visit Note  03/02/2024  Name: Christopher Orr MRN: [id number]          DOB: 30-Oct-1945  Situation: Patient enrolled in [facility] 30-day program. Visit completed with Augustin and Carlin Pitman by telephone.   Background:   Past Medical History:  Diagnosis Date   Abnormal findings on diagnostic imaging of digestive system    Acute kidney injury (HCC) 07/28/2014   Alcohol  abuse    pt states it is marijuana   Anemia    Arthritis    Arthritis 08/15/2019   Aspiration pneumonia (HCC) 07/28/2014   Bilateral inguinal hernia, without obstruction or gangrene, recurrent    Bipolar disorder (HCC)    BPH (benign prostatic hypertrophy) with urinary obstruction    CAD (coronary artery disease)    a. 2006 PCI/DES to LAD, EF 60%.   Chest pain 07/28/2014   Chronic heart failure with preserved ejection fraction (HFpEF) (HCC)    a. 07/2014 Echo: EF 55%; b. 11/2020 Echo: EF 50-55%, mod LVH, GrI DD, mildly enlarged RV w/ nl fxn, mild BAE.   Chronic pancreatitis (HCC)    Chronic viral hepatitis C (HCC) 09/24/2014   Overview:   GT 1a.   started on Harvoni 11/03/2014 for planned 12 week course, elastography shows cirrhosis.   Cigarette smoker 08/05/2018   Cirrhosis (HCC)    Cirrhosis of liver (HCC) 08/15/2019   Noted in 2016 imaging with alcohol  abuse and h/o hep C tx s/p harvoni       COPD (chronic obstructive pulmonary disease) (HCC)    Coronary artery disease involving native heart with angina pectoris (HCC) 05/31/2018   COVID-19    08/22/20   Dementia (HCC)    early dementia   Depression    Depression, recurrent (HCC) 06/14/2018   Dilated ascending aorta and aortic root (HCC)    a. 11/2020 Echo: Ao root 44mm, Asc Ao 40mm. Ao arch 37mm.   Diverticulitis    12/06/20 [facility]    Diverticulosis    Drug use    Dyspnea    ED (erectile dysfunction)    Elevated troponin I level 07/28/2014   Gastritis    GERD (gastroesophageal reflux disease)    Headache    occasional migraines    Hepatitis C    treated   Hernia, inguinal, bilateral 08/15/2019   Recurrent s/p repair       History of lumbar fusion    History of pancreatitis 08/15/2019   X 2    History of prostate cancer 08/15/2019   2017 s/p brachytherapy with seeds   F/u Dr. Penne       Hyperlipidemia    Hypertension    patient denies having high blood pressure   Internal hemorrhoids 01/29/2021   Leg edema 01/03/2022   Lung cancer (HCC)    Malignant neoplasm of lower lobe of left lung (HCC) 05/15/2020   Memory loss 08/15/2019   Nodule of lower lobe of left lung 11/09/2019   Nodule of middle lobe of right lung 08/15/2019   Other chronic pain 01/29/2021   Pancreatitis    x 2    Pneumonia    07/06/18    Polyp of transverse colon    Poor nutrition 06/09/2022   Premature atrial contractions    a. 04/2021 Zio: Freq PACs w/ 8% burden.   Prostate cancer [facility])    with seeds   PSVT (paroxysmal supraventricular tachycardia) (HCC)    a. 04/2021 Zio: Avg HR 80 (60-210). 42  SVT runs - longest 5 beats @ 116, fastest 210 x 5 beats. Freq PACs (8%).   PTSD (post-traumatic stress disorder)    Rib fracture    s/p fall 03/15/19    Right ear pain 07/18/2021   S/P lumbar fusion 12/27/2020   Sinus bradycardia 01/29/2021   Sinus disease    SOB (shortness of breath) on exertion 05/15/2020   Squamous carcinoma of lung, left (HCC) 11/26/2019   Syncope 01/29/2021   Syncope due to orthostatic hypotension 01/23/2021   Umbilical hernia without obstruction and without gangrene    Urge incontinence    Viral upper respiratory tract infection 07/18/2021    Assessment: Patient Reported Symptoms: Cognitive Cognitive Status: Difficulties with attention and concentration, Requires Assistance Decision Making, Struggling with memory recall      Neurological Neurological Review of Symptoms: No symptoms reported    HEENT HEENT Symptoms Reported: No symptoms reported      Cardiovascular Cardiovascular Symptoms Reported:  Lightheadness Does patient have uncontrolled Hypertension?: No Cardiovascular Management Strategies: Medication therapy, Routine screening, Adequate rest, Weight management Do You Have a Working Readable Scale?: No Weight: 211 lb (95.7 kg) Cardiovascular Comment: The patient went to the Cardiologist and had his Lasix , Farxiga  and Plavix  stopped. He reports feeling better. His blood pressure was 103/66 and he is on Midodrine  10mg  three times a day  Respiratory Respiratory Symptoms Reported: No symptoms reported Additional Respiratory Details: The patient smokes a half a pack of cigarettes daily    Endocrine Endocrine Symptoms Reported: No symptoms reported Is patient diabetic?: No    Gastrointestinal Gastrointestinal Symptoms Reported: No symptoms reported      Genitourinary Genitourinary Symptoms Reported: Incontinence    Integumentary Integumentary Symptoms Reported: No symptoms reported    Musculoskeletal Musculoskelatal Symptoms Reviewed: Limited mobility Musculoskeletal Management Strategies: Routine screening Musculoskeletal Comment: HHPT has not been able to contact the patient to schedule an appointment. The patient now has a phone but he is forgetful. His daughter Augustin is the primary contact but she doesn't answer the phone unless she recognizes the number. CM to ask to re-refer for Home Health   Patient at Risk for Falls Due to: History of fall(s)  Psychosocial Psychosocial Symptoms Reported: Other Other Psychosocial Conditions: Dementia         There were no vitals filed for this visit.  Medications Reviewed Today     Reviewed by Moises Reusing, RN (Case Manager) on 03/02/24 at 1229  Med List Status: <None>   Medication Order Taking? Sig Documenting Provider Last Dose Status Informant  amiodarone  (PACERONE ) 200 MG tablet [id number]  Take 1 tablet (200 mg total) by mouth 2 (two) times daily for 10 days, THEN 1 tablet (200 mg total) daily. Wouk, Devaughn Sayres, MD   Active   apixaban  (ELIQUIS ) 5 MG TABS tablet [id number]  Take 1 tablet (5 mg total) by mouth 2 (two) times daily. Simmons-Robinson, Makiera, MD  Active   ARIPiprazole  (ABILIFY ) 10 MG tablet [id number]  TAKE 1 TABLET BY MOUTH EVERY NIGHT AT BEDTIME Simmons-Robinson, Makiera, MD  Active   colchicine  0.6 MG tablet [id number]  Take 1 tablet (0.6 mg total) by mouth 2 (two) times daily. Simmons-Robinson, Makiera, MD  Active   digoxin  (LANOXIN ) 0.125 MG tablet [id number]  Take 1 tablet (0.125 mg total) by mouth daily. Wouk, Devaughn Sayres, MD  Active   donepezil  (ARICEPT ) 5 MG tablet [id number]  Take 1 tablet (5 mg total) by mouth at bedtime. Hope Merle, MD  Active Child, Pharmacy Records  famotidine  (  PEPCID ) 20 MG tablet [id number]  Take 1 tablet (20 mg total) by mouth daily. Simmons-Robinson, Makiera, MD  Active   FLUoxetine  (PROZAC ) 10 MG capsule [id number]  Take 3 capsules (30 mg total) by mouth daily. Simmons-Robinson, Makiera, MD  Active   FLUoxetine  (PROZAC ) 20 MG capsule [id number]  TAKE 1 CAPSULE BY MOUTH DAILY  Patient taking differently: Take 20 mg by mouth daily. Take 20 mg capsule by mouth along with one 10 mg capsule for total 30 mg once daily   Hope Merle, MD  Active Child, Pharmacy Records  memantine  (NAMENDA ) 10 MG tablet [id number]  Take 1 tablet (10 mg total) by mouth 2 (two) times daily. Simmons-Robinson, Makiera, MD  Active   midodrine  (PROAMATINE ) 10 MG tablet [id number]  Take 1 tablet (10 mg total) by mouth 3 (three) times daily with meals. Simmons-Robinson, Makiera, MD  Active   mirabegron  ER (MYRBETRIQ ) 50 MG TB24 tablet [id number]  Take 1 tablet (50 mg total) by mouth daily. Simmons-Robinson, Makiera, MD  Active   pantoprazole  (PROTONIX ) 40 MG tablet [id number]  TAKE 1 TABLET BY MOUTH EVERY IN THE MORNING 30 MINUTES BEFORE BREAKFAST Simmons-Robinson, Makiera, MD  Active   rosuvastatin  (CRESTOR ) 10 MG tablet [id number]  Take 1 tablet (10 mg total) by mouth at bedtime. Simmons-Robinson, Makiera,  MD  Active   sucralfate  (CARAFATE ) 1 g tablet [id number]  Take 1 tablet (1 g total) by mouth 4 (four) times daily. Simmons-Robinson, Makiera, MD  Active   tamsulosin  (FLOMAX ) 0.4 MG CAPS capsule [id number]  Take 1 capsule (0.4 mg total) by mouth daily. Simmons-Robinson, Makiera, MD  Active   traZODone  (DESYREL ) 100 MG tablet [id number]  Take 1 tablet (100 mg total) by mouth at bedtime as needed. for sleep Simmons-Robinson, Rockie, MD  Active             Recommendation:   Continue Current Plan of Care  Follow Up Plan:   Telephone follow-up in 1 week  Medford Balboa, BSN, RN [place]  VBCI - [facility] RN Care Manager [phone]
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
## 2024-03-05 ENCOUNTER — Other Ambulatory Visit: Payer: Self-pay | Admitting: Family Medicine

## 2024-03-07 ENCOUNTER — Other Ambulatory Visit: Payer: Self-pay | Admitting: Family Medicine

## 2024-03-07 DIAGNOSIS — K219 Gastro-esophageal reflux disease without esophagitis: Secondary | ICD-10-CM

## 2024-03-07 NOTE — Telephone Encounter (Signed)
 Copied from CRM [id number]. Topic: Clinical - Medication Refill >> Mar 07, 2024  1:58 PM Aleatha C wrote: Medication: famotidine  (PEPCID ) 20 MG tablet, sucralfate  (CARAFATE ) 1 g tablet, pantoprazole  (PROTONIX ) 40 MG tablet   Has the patient contacted their pharmacy? Yes (Agent: If no, request that the patient contact the pharmacy for the refill. If patient does not wish to contact the pharmacy document the reason why and proceed with request.) (Agent: If yes, when and what did the pharmacy advise?)  This is the patient's preferred pharmacy:  [id number] - [place], [place] - [address] [place] [address] Phone: [phone] Fax: [phone]     Is this the correct pharmacy for this prescription? Yes If no, delete pharmacy and type the correct one.   Has the prescription been filled recently? No  Is the patient out of the medication? Yes  Has the patient been seen for an appointment in the last year OR does the patient have an upcoming appointment? Yes  Can we respond through MyChart? No  Agent: Please be advised that Rx refills may take up to 3 business days. We ask that you follow-up with your pharmacy.

## 2024-03-09 ENCOUNTER — Other Ambulatory Visit: Payer: Self-pay

## 2024-03-09 VITALS — Wt 211.0 lb

## 2024-03-09 DIAGNOSIS — I214 Non-ST elevation (NSTEMI) myocardial infarction: Secondary | ICD-10-CM
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
## 2024-03-09 NOTE — Transitions of Care (Post Inpatient/ED Visit) (Signed)
 Transition of Care week 5  Visit Note  03/09/2024  Name: Christopher Orr MRN: [id number]          DOB: [birth year]  Situation: Patient enrolled in [facility] 30-day program. Visit completed with Reeves Pitman by telephone.   Background:   Past Medical History:  Diagnosis Date   Abnormal findings on diagnostic imaging of digestive system    Acute kidney injury (HCC) 07/28/2014   Alcohol  abuse    pt states it is marijuana   Anemia    Arthritis    Arthritis 08/15/2019   Aspiration pneumonia (HCC) 07/28/2014   Bilateral inguinal hernia, without obstruction or gangrene, recurrent    Bipolar disorder (HCC)    BPH (benign prostatic hypertrophy) with urinary obstruction    CAD (coronary artery disease)    a. 2006 PCI/DES to LAD, EF 60%.   Chest pain 07/28/2014   Chronic heart failure with preserved ejection fraction (HFpEF) (HCC)    a. 07/2014 Echo: EF 55%; b. 11/2020 Echo: EF 50-55%, mod LVH, GrI DD, mildly enlarged RV w/ nl fxn, mild BAE.   Chronic pancreatitis (HCC)    Chronic viral hepatitis C (HCC) 09/24/2014   Overview:   GT 1a.   started on Harvoni 11/03/2014 for planned 12 week course, elastography shows cirrhosis.   Cigarette smoker 08/05/2018   Cirrhosis (HCC)    Cirrhosis of liver (HCC) 08/15/2019   Noted in 2016 imaging with alcohol  abuse and h/o hep C tx s/p harvoni       COPD (chronic obstructive pulmonary disease) (HCC)    Coronary artery disease involving native heart with angina pectoris (HCC) 05/31/2018   COVID-19    08/22/20   Dementia (HCC)    early dementia   Depression    Depression, recurrent (HCC) 06/14/2018   Dilated ascending aorta and aortic root (HCC)    a. 11/2020 Echo: Ao root 44mm, Asc Ao 40mm. Ao arch 37mm.   Diverticulitis    12/06/20 [facility]    Diverticulosis    Drug use    Dyspnea    ED (erectile dysfunction)    Elevated troponin I level 07/28/2014   Gastritis    GERD (gastroesophageal reflux disease)    Headache    occasional migraines   Hepatitis  C    treated   Hernia, inguinal, bilateral 08/15/2019   Recurrent s/p repair       History of lumbar fusion    History of pancreatitis 08/15/2019   X 2    History of prostate cancer 08/15/2019   2017 s/p brachytherapy with seeds   F/u Dr. Penne       Hyperlipidemia    Hypertension    patient denies having high blood pressure   Internal hemorrhoids 01/29/2021   Leg edema 01/03/2022   Lung cancer (HCC)    Malignant neoplasm of lower lobe of left lung (HCC) 05/15/2020   Memory loss 08/15/2019   Nodule of lower lobe of left lung 11/09/2019   Nodule of middle lobe of right lung 08/15/2019   Other chronic pain 01/29/2021   Pancreatitis    x 2    Pneumonia    07/06/18    Polyp of transverse colon    Poor nutrition 06/09/2022   Premature atrial contractions    a. 04/2021 Zio: Freq PACs w/ 8% burden.   Prostate cancer [facility])    with seeds   PSVT (paroxysmal supraventricular tachycardia) (HCC)    a. 04/2021 Zio: Avg HR 80 (60-210). 42 SVT runs -  longest 5 beats @ 116, fastest 210 x 5 beats. Freq PACs (8%).   PTSD (post-traumatic stress disorder)    Rib fracture    s/p fall 03/15/19    Right ear pain 07/18/2021   S/P lumbar fusion 12/27/2020   Sinus bradycardia 01/29/2021   Sinus disease    SOB (shortness of breath) on exertion 05/15/2020   Squamous carcinoma of lung, left (HCC) 11/26/2019   Syncope 01/29/2021   Syncope due to orthostatic hypotension 01/23/2021   Umbilical hernia without obstruction and without gangrene    Urge incontinence    Viral upper respiratory tract infection 07/18/2021    Assessment: Patient Reported Symptoms: Cognitive Cognitive Status: Difficulties with attention and concentration, Requires Assistance Decision Making, Poor judgment in daily scenarios, Struggling with memory recall      Neurological Neurological Review of Symptoms: No symptoms reported    HEENT HEENT Symptoms Reported: No symptoms reported      Cardiovascular Cardiovascular  Symptoms Reported: Fatigue Does patient have uncontrolled Hypertension?: No Cardiovascular Management Strategies: Medication therapy, Routine screening, Adequate rest, Weight management Do You Have a Working Readable Scale?: Yes Weight: 211 lb (95.7 kg) Cardiovascular Comment: The patient complains of fatigue. He is sleeping a lot. 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 He wears the same clothes over and over without washing them and they smell of urine  Integumentary Integumentary Symptoms Reported: No symptoms reported    Musculoskeletal Musculoskelatal Symptoms Reviewed: Difficulty walking, Limited mobility Musculoskeletal Comment: The patient does not have HHPT and he doesn't want people coming to his home. His dementia is significant and he does not remember to get up or how to answer a phone      Psychosocial Psychosocial Symptoms Reported: Other Other Psychosocial Conditions: Dementia. Unable to assess         There were no vitals filed for this visit.  Medications Reviewed Today     Reviewed by Moises Reusing, RN (Case Manager) on 03/09/24 at 1349  Med List Status: <None>   Medication Order Taking? Sig Documenting Provider Last Dose Status Informant  amiodarone  (PACERONE ) 200 MG tablet  [id number]  Take 1 tablet (200 mg total) by mouth 2 (two) times daily for 10 days, THEN 1 tablet (200 mg total) daily. Wouk, Devaughn Sayres, MD  Active   apixaban  (ELIQUIS ) 5 MG TABS tablet [id number]  Take 1 tablet (5 mg total) by mouth 2 (two) times daily. Simmons-Robinson, Makiera, MD  Active   ARIPiprazole  (ABILIFY ) 10 MG tablet [id number]  TAKE 1 TABLET BY MOUTH EVERY NIGHT AT BEDTIME Simmons-Robinson, Makiera, MD  Active   colchicine  0.6 MG tablet [id number]  TAKE 1 TABLET BY MOUTH 2 TIMES DAILY. Simmons-Robinson, Makiera, MD  Active   digoxin  (LANOXIN ) 0.125 MG tablet [id number]  Take 1 tablet (0.125 mg total) by mouth daily. Wouk, Devaughn Sayres, MD  Active   donepezil  (ARICEPT ) 5 MG tablet [id number]  Take 1  tablet (5 mg total) by mouth at bedtime. Hope Merle, MD  Active Child, Pharmacy Records  famotidine  (PEPCID ) 20 MG tablet [id number]  Take 1 tablet (20 mg total) by mouth daily. Simmons-Robinson, Makiera, MD  Active   FLUoxetine  (PROZAC ) 10 MG capsule [id number]  Take 3 capsules (30 mg total) by mouth daily. Simmons-Robinson, Makiera, MD  Active   FLUoxetine  (PROZAC ) 20 MG capsule [id number]  TAKE 1 CAPSULE BY MOUTH DAILY  Patient taking differently: Take 20 mg by mouth daily. Take 20 mg capsule by mouth along with one 10 mg capsule for total 30 mg once daily   Hope Merle, MD  Active Child, Pharmacy Records  memantine  (NAMENDA ) 10 MG tablet [id number]  Take 1 tablet (10 mg total) by mouth 2 (two) times daily. Simmons-Robinson, Makiera, MD  Active   midodrine  (PROAMATINE ) 10 MG tablet [id number]  Take 1 tablet (10 mg total) by mouth 3 (three) times daily with meals. Simmons-Robinson, Makiera, MD  Active   mirabegron  ER (MYRBETRIQ ) 50 MG TB24 tablet [id number]  Take 1 tablet (50 mg total) by mouth daily. Simmons-Robinson, Makiera, MD  Active   pantoprazole  (PROTONIX ) 40 MG tablet [id number]  TAKE 1 TABLET BY MOUTH EVERY IN THE MORNING 30 MINUTES BEFORE BREAKFAST Simmons-Robinson, Makiera, MD  Active    rosuvastatin  (CRESTOR ) 10 MG tablet [id number]  Take 1 tablet (10 mg total) by mouth at bedtime. Simmons-Robinson, Makiera, MD  Active   sucralfate  (CARAFATE ) 1 g tablet [id number]  Take 1 tablet (1 g total) by mouth 4 (four) times daily. Simmons-Robinson, Makiera, MD  Active   tamsulosin  (FLOMAX ) 0.4 MG CAPS capsule [id number]  Take 1 capsule (0.4 mg total) by mouth daily. Simmons-Robinson, Makiera, MD  Active   traZODone  (DESYREL ) 100 MG tablet [id number]  Take 1 tablet (100 mg total) by mouth at bedtime as needed. for sleep Simmons-Robinson, Rockie, MD  Active             Recommendation:   Referral to: Complex Care Management and Social Work  Follow Up Plan:   Closing From:  Transitions of Care Program   [employer], BSN, Charity fundraiser [place]  VBCI - [employer] [facility] RN Care Manager [phone]

## 2024-03-09 NOTE — Transitions of Care (Post Inpatient/ED Visit) (Signed)
 Transition of Care week 5  Visit Note  03/09/2024  Name: Christopher Orr MRN: [id number]          DOB: [birth year]  Situation: Patient enrolled in [facility] 30-day program. Visit completed with Reeves Pitman by telephone.   Background:   Past Medical History:  Diagnosis Date   Abnormal findings on diagnostic imaging of digestive system    Acute kidney injury (HCC) 07/28/2014   Alcohol  abuse    pt states it is marijuana   Anemia    Arthritis    Arthritis 08/15/2019   Aspiration pneumonia (HCC) 07/28/2014   Bilateral inguinal hernia, without obstruction or gangrene, recurrent    Bipolar disorder (HCC)    BPH (benign prostatic hypertrophy) with urinary obstruction    CAD (coronary artery disease)    a. 2006 PCI/DES to LAD, EF 60%.   Chest pain 07/28/2014   Chronic heart failure with preserved ejection fraction (HFpEF) (HCC)    a. 07/2014 Echo: EF 55%; b. 11/2020 Echo: EF 50-55%, mod LVH, GrI DD, mildly enlarged RV w/ nl fxn, mild BAE.   Chronic pancreatitis (HCC)    Chronic viral hepatitis C (HCC) 09/24/2014   Overview:   GT 1a.   started on Harvoni 11/03/2014 for planned 12 week course, elastography shows cirrhosis.   Cigarette smoker 08/05/2018   Cirrhosis (HCC)    Cirrhosis of liver (HCC) 08/15/2019   Noted in 2016 imaging with alcohol  abuse and h/o hep C tx s/p harvoni       COPD (chronic obstructive pulmonary disease) (HCC)    Coronary artery disease involving native heart with angina pectoris (HCC) 05/31/2018   COVID-19    08/22/20   Dementia (HCC)    early dementia   Depression    Depression, recurrent (HCC) 06/14/2018   Dilated ascending aorta and aortic root (HCC)    a. 11/2020 Echo: Ao root 44mm, Asc Ao 40mm. Ao arch 37mm.   Diverticulitis    12/06/20 [facility]    Diverticulosis    Drug use    Dyspnea    ED (erectile dysfunction)    Elevated troponin I level 07/28/2014   Gastritis    GERD (gastroesophageal reflux disease)    Headache    occasional migraines   Hepatitis  C    treated   Hernia, inguinal, bilateral 08/15/2019   Recurrent s/p repair       History of lumbar fusion    History of pancreatitis 08/15/2019   X 2    History of prostate cancer 08/15/2019   2017 s/p brachytherapy with seeds   F/u Dr. Penne       Hyperlipidemia    Hypertension    patient denies having high blood pressure   Internal hemorrhoids 01/29/2021   Leg edema 01/03/2022   Lung cancer (HCC)    Malignant neoplasm of lower lobe of left lung (HCC) 05/15/2020   Memory loss 08/15/2019   Nodule of lower lobe of left lung 11/09/2019   Nodule of middle lobe of right lung 08/15/2019   Other chronic pain 01/29/2021   Pancreatitis    x 2    Pneumonia    07/06/18    Polyp of transverse colon    Poor nutrition 06/09/2022   Premature atrial contractions    a. 04/2021 Zio: Freq PACs w/ 8% burden.   Prostate cancer [facility])    with seeds   PSVT (paroxysmal supraventricular tachycardia) (HCC)    a. 04/2021 Zio: Avg HR 80 (60-210). 42 SVT runs -  longest 5 beats @ 116, fastest 210 x 5 beats. Freq PACs (8%).   PTSD (post-traumatic stress disorder)    Rib fracture    s/p fall 03/15/19    Right ear pain 07/18/2021   S/P lumbar fusion 12/27/2020   Sinus bradycardia 01/29/2021   Sinus disease    SOB (shortness of breath) on exertion 05/15/2020   Squamous carcinoma of lung, left (HCC) 11/26/2019   Syncope 01/29/2021   Syncope due to orthostatic hypotension 01/23/2021   Umbilical hernia without obstruction and without gangrene    Urge incontinence    Viral upper respiratory tract infection 07/18/2021    Assessment: Patient Reported Symptoms: Cognitive Cognitive Status: Difficulties with attention and concentration, Requires Assistance Decision Making, Poor judgment in daily scenarios, Struggling with memory recall      Neurological Neurological Review of Symptoms: No symptoms reported    HEENT HEENT Symptoms Reported: No symptoms reported      Cardiovascular Cardiovascular  Symptoms Reported: Fatigue Does patient have uncontrolled Hypertension?: No Cardiovascular Management Strategies: Medication therapy, Routine screening, Adequate rest, Weight management Do You Have a Working Readable Scale?: Yes Weight: 211 lb (95.7 kg) Cardiovascular Comment: The patient complains of fatigue. He is sleeping a lot. 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 He wears the same clothes over and over without washing them and they smell of urine  Integumentary Integumentary Symptoms Reported: No symptoms reported    Musculoskeletal Musculoskelatal Symptoms Reviewed: Difficulty walking, Limited mobility Musculoskeletal Comment: The patient does not have HHPT and he doesn't want people coming to his home. His dementia is significant and he does not remember to get up or how to answer a phone      Psychosocial Psychosocial Symptoms Reported: Other Other Psychosocial Conditions: Dementia. Unable to assess         There were no vitals filed for this visit.  Medications Reviewed Today     Reviewed by Moises Reusing, RN (Case Manager) on 03/09/24 at 1349  Med List Status: <None>   Medication Order Taking? Sig Documenting Provider Last Dose Status Informant  amiodarone  (PACERONE ) 200 MG tablet  [id number]  Take 1 tablet (200 mg total) by mouth 2 (two) times daily for 10 days, THEN 1 tablet (200 mg total) daily. Wouk, Devaughn Sayres, MD  Active   apixaban  (ELIQUIS ) 5 MG TABS tablet [id number]  Take 1 tablet (5 mg total) by mouth 2 (two) times daily. Simmons-Robinson, Makiera, MD  Active   ARIPiprazole  (ABILIFY ) 10 MG tablet [id number]  TAKE 1 TABLET BY MOUTH EVERY NIGHT AT BEDTIME Simmons-Robinson, Makiera, MD  Active   colchicine  0.6 MG tablet [id number]  TAKE 1 TABLET BY MOUTH 2 TIMES DAILY. Simmons-Robinson, Makiera, MD  Active   digoxin  (LANOXIN ) 0.125 MG tablet [id number]  Take 1 tablet (0.125 mg total) by mouth daily. Wouk, Devaughn Sayres, MD  Active   donepezil  (ARICEPT ) 5 MG tablet [id number]  Take 1  tablet (5 mg total) by mouth at bedtime. Hope Merle, MD  Active Child, Pharmacy Records  famotidine  (PEPCID ) 20 MG tablet [id number]  Take 1 tablet (20 mg total) by mouth daily. Simmons-Robinson, Makiera, MD  Active   FLUoxetine  (PROZAC ) 10 MG capsule [id number]  Take 3 capsules (30 mg total) by mouth daily. Simmons-Robinson, Makiera, MD  Active   FLUoxetine  (PROZAC ) 20 MG capsule [id number]  TAKE 1 CAPSULE BY MOUTH DAILY  Patient taking differently: Take 20 mg by mouth daily. Take 20 mg capsule by mouth along with one 10 mg capsule for total 30 mg once daily   Hope Merle, MD  Active Child, Pharmacy Records  memantine  (NAMENDA ) 10 MG tablet [id number]  Take 1 tablet (10 mg total) by mouth 2 (two) times daily. Simmons-Robinson, Makiera, MD  Active   midodrine  (PROAMATINE ) 10 MG tablet [id number]  Take 1 tablet (10 mg total) by mouth 3 (three) times daily with meals. Simmons-Robinson, Makiera, MD  Active   mirabegron  ER (MYRBETRIQ ) 50 MG TB24 tablet [id number]  Take 1 tablet (50 mg total) by mouth daily. Simmons-Robinson, Makiera, MD  Active   pantoprazole  (PROTONIX ) 40 MG tablet [id number]  TAKE 1 TABLET BY MOUTH EVERY IN THE MORNING 30 MINUTES BEFORE BREAKFAST Simmons-Robinson, Makiera, MD  Active    rosuvastatin  (CRESTOR ) 10 MG tablet [id number]  Take 1 tablet (10 mg total) by mouth at bedtime. Simmons-Robinson, Makiera, MD  Active   sucralfate  (CARAFATE ) 1 g tablet [id number]  Take 1 tablet (1 g total) by mouth 4 (four) times daily. Simmons-Robinson, Makiera, MD  Active   tamsulosin  (FLOMAX ) 0.4 MG CAPS capsule [id number]  Take 1 capsule (0.4 mg total) by mouth daily. Simmons-Robinson, Makiera, MD  Active   traZODone  (DESYREL ) 100 MG tablet [id number]  Take 1 tablet (100 mg total) by mouth at bedtime as needed. for sleep Simmons-Robinson, Rockie, MD  Active             Recommendation:   Referral to: Complex Care Management and Social Work  Follow Up Plan:   Closing From:  Transitions of Care Program   [employer], BSN, Charity fundraiser [place]  VBCI - [employer] [facility] RN Care Manager [phone]

## 2024-03-10 MED ORDER — PANTOPRAZOLE SODIUM 40 MG PO TBEC: DELAYED_RELEASE_TABLET | ORAL | 1 refills | Status: AC

## 2024-03-10 MED ORDER — FAMOTIDINE 20 MG PO TABS: 20.0000 mg | ORAL_TABLET | Freq: Every day | ORAL | 1 refills | Status: AC

## 2024-03-10 MED ORDER — SUCRALFATE 1 G PO TABS: 1.0000 g | ORAL_TABLET | Freq: Four times a day (QID) | ORAL | 2 refills | Status: AC

## 2024-03-10 NOTE — Telephone Encounter (Unsigned)
 Copied from CRM [id number]. Topic: Clinical - Prescription Issue >> Mar 10, 2024  8:59 AM Pinkey ORN wrote: Reason for CRM: sucralfate  (CARAFATE ) 1 g tablet, pantoprazole  (PROTONIX ) 40 MG tablet, FLUoxetine  (PROZAC ) 20 MG capsule >> Mar 10, 2024  9:02 AM Pinkey ORN wrote: [facility] [phone] is calling on behalf of patient medications. States they've been faxing over refill requests for these medications for the last couple days now and hasn't gotten a response as of yet.

## 2024-03-14 ENCOUNTER — Telehealth: Payer: Self-pay

## 2024-03-14 ENCOUNTER — Encounter: Payer: Self-pay | Admitting: Family Medicine

## 2024-03-14 ENCOUNTER — Ambulatory Visit: Admitting: Family Medicine

## 2024-03-14 VITALS — BP 111/60 | HR 75 | Resp 16 | Ht 72.0 in | Wt 215.0 lb

## 2024-03-14 DIAGNOSIS — I428 Other cardiomyopathies: Secondary | ICD-10-CM | POA: Diagnosis not present

## 2024-03-14 DIAGNOSIS — I480 Paroxysmal atrial fibrillation: Secondary | ICD-10-CM | POA: Diagnosis not present

## 2024-03-14 DIAGNOSIS — R0602 Shortness of breath: Secondary | ICD-10-CM

## 2024-03-14 DIAGNOSIS — F0390 Unspecified dementia without behavioral disturbance: Secondary | ICD-10-CM

## 2024-03-14 DIAGNOSIS — G301 Alzheimer's disease with late onset: Secondary | ICD-10-CM | POA: Diagnosis not present

## 2024-03-14 DIAGNOSIS — F0280 Dementia in other diseases classified elsewhere without behavioral disturbance: Secondary | ICD-10-CM

## 2024-03-14 DIAGNOSIS — N1831 Chronic kidney disease, stage 3a: Secondary | ICD-10-CM

## 2024-03-14 DIAGNOSIS — J449 Chronic obstructive pulmonary disease, unspecified: Secondary | ICD-10-CM

## 2024-03-14 DIAGNOSIS — I5032 Chronic diastolic (congestive) heart failure: Secondary | ICD-10-CM | POA: Diagnosis not present

## 2024-03-14 NOTE — Patient Instructions (Addendum)
 VISIT SUMMARY: Today, you were seen for shortness of breath, primarily with exertion. We discussed your chronic conditions, including chronic obstructive pulmonary disease (COPD), heart failure, and atrial fibrillation. Your current medications and their management were reviewed, and we addressed concerns about your appetite and weight. We also talked about your smoking habits and the importance of quitting smoking.  YOUR PLAN: -CHRONIC DIASTOLIC HEART FAILURE (NYHA CLASS II): Chronic diastolic heart failure means your heart has difficulty relaxing and filling with blood, which can cause symptoms like shortness of breath. We will continue to hold your diuretics (Lasix  and Farxiga ) as recommended by your cardiologist, and you should follow up with cardiology in September.  -ATRIAL FIBRILLATION: Atrial fibrillation is an irregular and often rapid heart rate that can increase your risk of strokes. You should continue taking amiodarone  200 mg daily and Eliquis  5 mg twice daily as prescribed.  -CORONARY ARTERY DISEASE: Coronary artery disease involves the narrowing or blockage of the coronary arteries. You have a stent placed and should continue your current management under cardiology supervision.  -CHRONIC OBSTRUCTIVE PULMONARY DISEASE (COPD): COPD is a chronic lung disease that makes it hard to breathe. Your oxygen  levels are good, and there are no signs of an acute flare-up. Please continue your current management and work on quitting smoking.  -DEMENTIA WITHOUT BEHAVIORAL DISTURBANCE: Dementia affects your memory and thinking skills. You are oriented to date and location but not situation. We will refer you to neurology for further evaluation.  -MALNUTRITION: Malnutrition means your body is not getting enough nutrients. You have reported a poor appetite but have gained 4 pounds since early August. Please try to maintain an adequate nutritional intake.  -HYPERLIPIDEMIA: Hyperlipidemia means you have  high levels of fats in your blood. Your last LDL level was well-controlled at 32. Continue taking Crestor  10 mg daily.  -HISTORY OF MYOPERICARDITIS: Myopericarditis is inflammation of the heart muscle and the surrounding sac. You are completing your colchicine  treatment as recommended. Continue taking colchicine  0.6 mg twice daily.  -TOBACCO USE DISORDER: Tobacco use disorder means you are dependent on smoking. Quitting smoking is highly recommended to improve your overall health and manage your COPD. Please consider smoking cessation options.  INSTRUCTIONS: Please follow up with cardiology in September for your heart failure management. Additionally, we will refer you to neurology for further evaluation of your dementia. Continue taking your medications as prescribed and work on quitting smoking. Maintain a healthy diet to address your malnutrition.                      Contains text generated by Abridge.             VISIT SUMMARY: Today, you were seen for shortness of breath, primarily with exertion. We discussed your chronic conditions, including chronic obstructive pulmonary disease (COPD), heart failure, and atrial fibrillation. Your current medications and their management were reviewed, and we addressed concerns about your appetite and weight. We also talked about your smoking habits and the importance of quitting smoking.  YOUR PLAN: -CHRONIC DIASTOLIC HEART FAILURE (NYHA CLASS II): Chronic diastolic heart failure means your heart has difficulty relaxing and filling with blood, which can cause symptoms like shortness of breath. We will continue to hold your diuretics (Lasix  and Farxiga ) as recommended by your cardiologist, and you should follow up with cardiology in September.  -ATRIAL FIBRILLATION: Atrial fibrillation is an irregular and often rapid heart rate that can increase your risk of strokes. You should continue taking  amiodarone  200 mg daily and Eliquis   5 mg twice daily as prescribed.  -CORONARY ARTERY DISEASE: Coronary artery disease involves the narrowing or blockage of the coronary arteries. You have a stent placed and should continue your current management under cardiology supervision.  -CHRONIC OBSTRUCTIVE PULMONARY DISEASE (COPD): COPD is a chronic lung disease that makes it hard to breathe. Your oxygen  levels are good, and there are no signs of an acute flare-up. Please continue your current management and work on quitting smoking.  -DEMENTIA WITHOUT BEHAVIORAL DISTURBANCE: Dementia affects your memory and thinking skills. You are oriented to date and location but not situation. We will refer you to neurology for further evaluation.  -MALNUTRITION: Malnutrition means your body is not getting enough nutrients. You have reported a poor appetite but have gained 4 pounds since early August. Please try to maintain an adequate nutritional intake.  -HYPERLIPIDEMIA: Hyperlipidemia means you have high levels of fats in your blood. Your last LDL level was well-controlled at 32. Continue taking Crestor  10 mg daily.  -HISTORY OF MYOPERICARDITIS: Myopericarditis is inflammation of the heart muscle and the surrounding sac. You are completing your colchicine  treatment as recommended. Continue taking colchicine  0.6 mg twice daily.  -TOBACCO USE DISORDER: Tobacco use disorder means you are dependent on smoking. Quitting smoking is highly recommended to improve your overall health and manage your COPD. Please consider smoking cessation options.  INSTRUCTIONS: Please follow up with cardiology in September for your heart failure management. Additionally, we will refer you to neurology for further evaluation of your dementia. Continue taking your medications as prescribed and work on quitting smoking. Maintain a healthy diet to address your malnutrition.                      Contains text generated by Abridge.                                  Contains text generated by Abridge.                       Contains text generated by Abridge.

## 2024-03-14 NOTE — Progress Notes (Addendum)
 Established patient visit   Patient: Christopher Orr   DOB: 07-05-1946   78 y.o. Male  MRN: [id number] Visit Date: 03/14/2024  Today's healthcare provider: Rockie Agent, MD   Chief Complaint  Patient presents with   Follow-up    SOB family requesting Oxygen    Subjective     HPI     Follow-up    Additional comments: SOB family requesting Oxygen       Last edited by Marylen Odella CROME, CMA on 03/14/2024  1:04 PM.       Discussed the use of AI scribe software for clinical note transcription with the patient, who gave verbal consent to proceed.  History of Present Illness Christopher Orr is a 78 year old male with chronic obstructive pulmonary disease and heart failure who presents with shortness of breath. He is accompanied by Christopher Orr, his stepfather's daughter.  He experiences shortness of breath primarily with exertion, such as walking. His family is concerned about whether he needs supplemental oxygen . He denies dizziness and fatigue. He has a poor appetite and continues to experience shortness of breath with exertion.  He has a history of chronic obstructive pulmonary disease, chronic diastolic heart failure, and atrial fibrillation with rapid ventricular response. He was previously classified as NYHA class II heart failure. His cardiologist recommended holding diuretics, leading him to stop taking Lasix  and Farxiga . He has been prescribed midodrine  10 mg three times daily for blood pressure management, amiodarone  200 mg daily, and Eliquis  5 mg twice daily for atrial fibrillation, but there is uncertainty regarding his current adherence to these medications. He underwent a PCI with a drug-eluting stent in 2006 and a right heart catheterization on February 01, 2024.  He has a history of osteoarthritis, dementia without behavioral disturbance, degenerative disc disease, malnutrition, hyperlipidemia, history of alcohol  abuse, and history of bipolar disorder. He continues to  smoke half a pack of cigarettes per day. He has been completing colchicine  for myopericarditis. He has gained four pounds since early August 2025.  He lives alone but has some support from family members. His sister, Christopher Orr, has power of attorney and helps with medication management occasionally. He is able to perform activities of daily living independently, including dressing, eating, and using the bathroom.  Christopher Orr patient's step daughter     Past Medical History:  Diagnosis Date   Abnormal findings on diagnostic imaging of digestive system    Acute kidney injury (HCC) 07/28/2014   Alcohol  abuse    pt states it is marijuana   Anemia    Arthritis    Arthritis 08/15/2019   Aspiration pneumonia (HCC) 07/28/2014   Bilateral inguinal hernia, without obstruction or gangrene, recurrent    Bipolar disorder (HCC)    BPH (benign prostatic hypertrophy) with urinary obstruction    CAD (coronary artery disease)    a. 2006 PCI/DES to LAD, EF 60%.   Chest pain 07/28/2014   Chronic heart failure with preserved ejection fraction (HFpEF) (HCC)    a. 07/2014 Echo: EF 55%; b. 11/2020 Echo: EF 50-55%, mod LVH, GrI DD, mildly enlarged RV w/ nl fxn, mild BAE.   Chronic pancreatitis (HCC)    Chronic viral hepatitis C (HCC) 09/24/2014   Overview:   GT 1a.   started on Harvoni 11/03/2014 for planned 12 week course, elastography shows cirrhosis.   Cigarette smoker 08/05/2018   Cirrhosis (HCC)    Cirrhosis of liver (HCC) 08/15/2019   Noted in 2016 imaging with alcohol  abuse and  h/o hep C tx s/p harvoni       COPD (chronic obstructive pulmonary disease) (HCC)    Coronary artery disease involving native heart with angina pectoris (HCC) 05/31/2018   COVID-19    08/22/20   Dementia (HCC)    early dementia   Depression    Depression, recurrent (HCC) 06/14/2018   Dilated ascending aorta and aortic root (HCC)    a. 11/2020 Echo: Ao root 44mm, Asc Ao 40mm. Ao arch 37mm.   Diverticulitis    12/06/20 [facility]     Diverticulosis    Drug use    Dyspnea    ED (erectile dysfunction)    Elevated troponin I level 07/28/2014   Gastritis    GERD (gastroesophageal reflux disease)    Headache    occasional migraines   Hepatitis C    treated   Hernia, inguinal, bilateral 08/15/2019   Recurrent s/p repair       History of lumbar fusion    History of pancreatitis 08/15/2019   X 2    History of prostate cancer 08/15/2019   2017 s/p brachytherapy with seeds   F/u Dr. Penne       Hyperlipidemia    Hypertension    patient denies having high blood pressure   Internal hemorrhoids 01/29/2021   Leg edema 01/03/2022   Lung cancer (HCC)    Malignant neoplasm of lower lobe of left lung (HCC) 05/15/2020   Memory loss 08/15/2019   Nodule of lower lobe of left lung 11/09/2019   Nodule of middle lobe of right lung 08/15/2019   Other chronic pain 01/29/2021   Pancreatitis    x 2    Pneumonia    07/06/18    Polyp of transverse colon    Poor nutrition 06/09/2022   Premature atrial contractions    a. 04/2021 Zio: Freq PACs w/ 8% burden.   Prostate cancer [facility])    with seeds   PSVT (paroxysmal supraventricular tachycardia) (HCC)    a. 04/2021 Zio: Avg HR 80 (60-210). 42 SVT runs - longest 5 beats @ 116, fastest 210 x 5 beats. Freq PACs (8%).   PTSD (post-traumatic stress disorder)    Rib fracture    s/p fall 03/15/19    Right ear pain 07/18/2021   S/P lumbar fusion 12/27/2020   Sinus bradycardia 01/29/2021   Sinus disease    SOB (shortness of breath) on exertion 05/15/2020   Squamous carcinoma of lung, left (HCC) 11/26/2019   Syncope 01/29/2021   Syncope due to orthostatic hypotension 01/23/2021   Umbilical hernia without obstruction and without gangrene    Urge incontinence    Viral upper respiratory tract infection 07/18/2021    Medications: Outpatient Medications Prior to Visit  Medication Sig   amiodarone  (PACERONE ) 200 MG tablet Take 1 tablet (200 mg total) by mouth 2 (two) times daily  for 10 days, THEN 1 tablet (200 mg total) daily.   apixaban  (ELIQUIS ) 5 MG TABS tablet Take 1 tablet (5 mg total) by mouth 2 (two) times daily.   ARIPiprazole  (ABILIFY ) 10 MG tablet TAKE 1 TABLET BY MOUTH EVERY NIGHT AT BEDTIME   colchicine  0.6 MG tablet TAKE 1 TABLET BY MOUTH 2 TIMES DAILY.   famotidine  (PEPCID ) 20 MG tablet Take 1 tablet (20 mg total) by mouth daily.   FLUoxetine  (PROZAC ) 10 MG capsule Take 3 capsules (30 mg total) by mouth daily.   FLUoxetine  (PROZAC ) 20 MG capsule TAKE 1 CAPSULE BY MOUTH DAILY (Patient taking differently: Take  20 mg by mouth daily. Take 20 mg capsule by mouth along with one 10 mg capsule for total 30 mg once daily)   memantine  (NAMENDA ) 10 MG tablet Take 1 tablet (10 mg total) by mouth 2 (two) times daily.   midodrine  (PROAMATINE ) 10 MG tablet Take 1 tablet (10 mg total) by mouth 3 (three) times daily with meals.   mirabegron  ER (MYRBETRIQ ) 50 MG TB24 tablet Take 1 tablet (50 mg total) by mouth daily.   pantoprazole  (PROTONIX ) 40 MG tablet TAKE 1 TABLET BY MOUTH EVERY IN THE MORNING 30 MINUTES BEFORE BREAKFAST   rosuvastatin  (CRESTOR ) 10 MG tablet Take 1 tablet (10 mg total) by mouth at bedtime.   sucralfate  (CARAFATE ) 1 g tablet Take 1 tablet (1 g total) by mouth 4 (four) times daily.   tamsulosin  (FLOMAX ) 0.4 MG CAPS capsule Take 1 capsule (0.4 mg total) by mouth daily.   traZODone  (DESYREL ) 100 MG tablet Take 1 tablet (100 mg total) by mouth at bedtime as needed. for sleep   [DISCONTINUED] digoxin  (LANOXIN ) 0.125 MG tablet Take 1 tablet (0.125 mg total) by mouth daily. (Patient not taking: Reported on 03/09/2024)   [DISCONTINUED] donepezil  (ARICEPT ) 5 MG tablet Take 1 tablet (5 mg total) by mouth at bedtime.   No facility-administered medications prior to visit.    Review of Systems  Last metabolic panel Lab Results  Component Value Date   GLUCOSE 133 (H) 02/04/2024   NA 138 02/04/2024   K 3.8 02/04/2024   CL 109 02/04/2024   CO2 20 (L) 02/04/2024    BUN 17 02/04/2024   CREATININE 1.21 02/04/2024   GFRNONAA >60 02/04/2024   CALCIUM  8.3 (L) 02/04/2024   PHOS 2.5 02/04/2024   PROT 7.1 02/19/2023   ALBUMIN 3.6 02/19/2023   BILITOT 0.4 02/19/2023   ALKPHOS 77 02/19/2023   AST 15 02/19/2023   ALT 16 02/19/2023   ANIONGAP 9 02/04/2024        Objective    BP 111/60 (BP Location: Right Arm, Patient Position: Bed low/side rails up, Cuff Size: Normal)   Pulse 75   Resp 16   Ht 6' (1.829 m)   Wt 215 lb (97.5 kg)   SpO2 95%   BMI 29.16 kg/m  BP Readings from Last 3 Encounters:  03/14/24 111/60  02/24/24 103/66  02/09/24 (!) 93/58   Wt Readings from Last 3 Encounters:  03/14/24 215 lb (97.5 kg)  03/09/24 211 lb (95.7 kg)  03/02/24 211 lb (95.7 kg)         Physical Exam VITALS: P- 75 CHEST: Lungs clear to auscultation, no wheezing, no crackles. CARDIOVASCULAR: Heart regular rate and rhythm. NEUROLOGICAL: Patient oriented to date and location, not situation.    No results found for any visits on 03/14/24.  Assessment & Plan     Problem List Items Addressed This Visit     Chronic diastolic heart failure (HCC)   COPD (chronic obstructive pulmonary disease) (HCC) - Primary (Chronic)   Late onset Alzheimer's disease without behavioral disturbance (HCC)   Relevant Orders   Ambulatory referral to Neurology   Nonischemic cardiomyopathy (HCC)   Paroxysmal atrial fibrillation (HCC)   Stage 3a chronic kidney disease (HCC)   Other Visit Diagnoses       SOB (shortness of breath)            Assessment & Plan Chronic diastolic heart failure (NYHA class II) Chronic diastolic heart failure classified as NYHA class II. Cardiologist recommended holding diuretics due to  concerns of hypotension and dizziness. No signs of fluid overload or edema. Blood pressure is well-controlled. - Continue to hold Lasix  and Farxiga  - Follow up with cardiology in September  Atrial fibrillation, on anticoagulation and antiarrhythmic  therapy Chronic condition  Paroxysmal Atrial fibrillation managed with amiodarone  and Eliquis . Amiodarone  restarted and Eliquis  continued for stroke prevention. - Continue amiodarone  200 mg daily - Continue Eliquis  5 mg twice daily - continue f/u with cardiology as scheduled   Coronary artery disease, status post PCI with drug-eluting stent Chronic  Coronary artery disease with PCI and drug-eluting stent placement. No recent cardiac events. Managed by cardiology. - Continue current management under cardiology supervision  Chronic obstructive pulmonary disease (COPD) No acute exacerbation of COPD. Oxygen  saturation at 95%. No wheezing or crackles on lung exam. - Continue current management - Encourage smoking cessation - denies SOB, family members deny that patient appears to have SOB on exertion in the home - oxygen  sats >88%, do not recommend supplemental oxygen  therapy at this time   Late onset Alzheimer's Dementia  Dementia without behavioral disturbance. Oriented to date and location but not situation. Concern for patient's ability to manage his own medications.  - Refer to neurology for dementia evaluation - continue namenda  10mg  BID   CKD Stage 2 Last creatine was 1.2 with GFR >60, improved from prior  -no current medications for this problem  -continue to monitor creatinine and GFR -recommend oral hydration to prevent decreased renal perfusion    Decreased Appetite BMI 29 Reports decreased appetite and low food intake. Weight gain of 4 pounds since early August. No significant weight loss over the past year. - Encourage adequate nutritional intake  Hyperlipidemia Chronic condition  Hyperlipidemia managed with Crestor . Last LDL was 32, indicating good control. - Continue Crestor  10 mg daily  History of myopericarditis Completing colchicine  treatment as per cardiology recommendation. - Continue colchicine  0.6 mg twice daily  Tobacco use disorder Continues to smoke  half a pack per day. Smoking cessation recommended to improve overall health and manage COPD. - Encourage smoking cessation  Social concerns regarding living arrangements  Current concern for safe living arrangements for the patient given his hx of dementia, TOC  team has the patient enrolled in their 30 day program  Pt currently lives with his older son, previously assisted by his younger son, Reeves and his significant other, Heidi who have since moved out of the home  -concern that patient has multiple medications that he has been monitoring himself in setting of dementia - encouraged the patient and his caregiver (accompanied by his step daughter, Christopher who does not live in the home with the patient) to follow up as scheduled with [facility] team for calls  -family reports patient is unable to    Return in about 2 months (around 05/14/2024) for AWV.         Rockie Agent, MD  [facility] [phone] (phone) [phone] (fax)  [facility]

## 2024-03-14 NOTE — Progress Notes (Signed)
 Complex Care Management Note Care Guide Note  03/14/2024 Name: Christopher Orr MRN: [id number] DOB: 1946-04-17   Complex Care Management Outreach Attempts: An unsuccessful telephone outreach was attempted today to offer the patient information about available complex care management services.  Follow Up Plan:  Additional outreach attempts will be made to offer the patient complex care management information and services.   Encounter Outcome:  No Answer  Dreama [facility]  [facility], [facility] VBCI Assistant Direct Dial: [phone]  Fax: [phone]

## 2024-03-14 NOTE — Progress Notes (Signed)
 Complex Care Management Note  Care Guide Note 03/14/2024 Name: AYDON SWAMY MRN: [id number] DOB: Jun 15, 1946  WAVERLY CHAVARRIA is a 78 y.o. year old male who sees Simmons-Robinson, Rockie, MD for primary care. I reached out to [name] by phone today to offer complex care management services.  Mr. Furtick was given information about Complex Care Management services today including:   The Complex Care Management services include support from the care team which includes your Nurse Care Manager, Clinical Social Worker, or Pharmacist.  The Complex Care Management team is here to help remove barriers to the health concerns and goals most important to you. Complex Care Management services are voluntary, and the patient may decline or stop services at any time by request to their care team member.   Complex Care Management Consent Status: Patient agreed to services and verbal consent obtained.   Follow up plan:  Telephone appointment with complex care management team member scheduled for:  03/30/24 & 03/31/24.  Encounter Outcome:  Patient Scheduled  Dreama [facility]  [facility], [facility] VBCI Assistant Direct Dial: [phone]  Fax: [phone]

## 2024-03-30 ENCOUNTER — Telehealth: Payer: Self-pay

## 2024-03-30 NOTE — Patient Instructions (Signed)
 Christopher Orr - I am sorry I was unable to reach you today for our scheduled appointment. I work with Sharma Coyer, MD and am calling to support your healthcare needs. Please contact me at [phone] at your earliest convenience. I look forward to speaking with you soon.   Thank you,  Nestora Duos, MSN, RN [facility]  [facility], [facility] RN Care Manager Direct Dial: [phone] Fax: [phone]

## 2024-03-31 ENCOUNTER — Telehealth: Payer: Self-pay | Admitting: *Deleted

## 2024-03-31 ENCOUNTER — Encounter: Payer: Self-pay | Admitting: *Deleted

## 2024-03-31 DIAGNOSIS — I480 Paroxysmal atrial fibrillation: Secondary | ICD-10-CM | POA: Insufficient documentation

## 2024-04-04 ENCOUNTER — Telehealth: Payer: Self-pay

## 2024-04-04 NOTE — Progress Notes (Unsigned)
 Complex Care Management Care Guide Note  04/04/2024 Name: Christopher Orr MRN: [id number] DOB: 12-30-1945  Christopher Orr is a 78 y.o. year old male who is a primary care patient of Simmons-Robinson, Rockie, MD and is actively engaged with the care management team. I reached out to [name] by phone today to assist with re-scheduling  with the RN Case Manager.  Follow up plan: Unsuccessful telephone outreach attempt made. A HIPAA compliant phone message was left for the patient providing contact information and requesting a return call.  [name] [facility], [facility] Guide  Direct Dial: [phone]  Fax [phone]

## 2024-04-05 NOTE — Progress Notes (Signed)
 Complex Care Management Care Guide Note  04/05/2024 Name: LAVONTAY KIRK MRN: [id number] DOB: 1946/03/03  MANASSEH PITTSLEY is a 78 y.o. year old male who is a primary care patient of Simmons-Robinson, Rockie, MD and is actively engaged with the care management team. I reached out to [name] by phone today to assist with re-scheduling  with the RN Case Manager.  Follow up plan: Telephone appointment with complex care management team member scheduled for:  04/20/24 @ 3 PM  [name] [facility], [facility] Guide  Direct Dial: [phone]  Fax [phone]

## 2024-04-11 ENCOUNTER — Ambulatory Visit: Attending: Medical | Admitting: Medical

## 2024-04-11 NOTE — Progress Notes (Deleted)
  Cardiology Office Note   Date:  04/11/2024  ID:  Christopher Orr, DOB Mar 10, 1946, MRN [id number] PCP: Christopher Coyer, MD  [facility] Providers Cardiologist:  Deatrice Cage, MD Electrophysiologist:  OLE ONEIDA HOLTS, MD { Click to update primary MD,subspecialty MD or APP then REFRESH:1}    History of Present Illness Christopher Orr is a 78 y.o. male with a h/o COPD, CAD s/p PCI with DES to LAD in 2006, HTN, HLD, chronic heart failure, pericarditis, alcohol /tobacco use, CKD, dementia, AF RVR, lung and prostate cancer who presents for chronic systolic heart failure.   Patient was admitted in July 2025 with new onset A-fib RVR, non-STEMI with troponin peak over 4000, acute pericarditis, HFrEF/nonischemic cardiomyopathy.  He presented on July 8th with new A-fib RVR and hypotension started on IV heparin  and amiodarone .  He was later transition to oral Amio and started on Eliquis .  He underwent right left heart cath showing normal coronaries and minimal pLAD disease and widely patent and mLAD stent.  It was felt presentation was more consistent with pericarditis and he was started on colchicine  and ibuprofen  x 3 months, however ibuprofen  was held due to DOAC.  Echo showed EF of 25 to 30%.  He was continued on losartan  and Farxiga .  Low blood pressure limited GDMT.  Patient saw advanced heart failure team 02/24/2024.  Due to low filling pressures diuretics were stopped.  Lasix , Farxiga , and digoxin  were all stopped.  He was continued on midodrine .  Today,  ROS: ***  Studies Reviewed      *** Risk Assessment/Calculations {Does this patient have ATRIAL FIBRILLATION?:[phone]} No BP recorded.  {Refresh Note OR Click here to enter BP  :1}***       Physical Exam VS:  There were no vitals taken for this visit.       Wt Readings from Last 3 Encounters:  03/14/24 215 lb (97.5 kg)  03/09/24 211 lb (95.7 kg)  03/02/24 211 lb (95.7 kg)    GEN: Well nourished, well developed  in no acute distress NECK: No JVD; No carotid bruits CARDIAC: ***RRR, no murmurs, rubs, gallops RESPIRATORY:  Clear to auscultation without rales, wheezing or rhonchi  ABDOMEN: Soft, non-tender, non-distended EXTREMITIES:  No edema; No deformity   ASSESSMENT AND PLAN ***    {Are you ordering a CV Procedure (e.g. stress test, cath, DCCV, TEE, etc)?   Press F2        :[id number]}  Dispo: ***  Signed, Ceriah Kohler VEAR Fishman, PA-C

## 2024-04-15 ENCOUNTER — Encounter: Payer: Self-pay | Admitting: *Deleted

## 2024-04-15 ENCOUNTER — Telehealth: Payer: Self-pay | Admitting: *Deleted

## 2024-04-15 NOTE — Patient Instructions (Signed)
 Christopher Orr - I am sorry I was unable to reach you today.  I work with Sharma Coyer, MD and am calling to support your healthcare needs. Please contact me at [phone] at your earliest convenience. I look forward to speaking with you soon.   Thank you,     Wandell Scullion, LCSW [place]  [facility], [facility] Licensed Clinical Social Worker  Direct Dial: [phone]

## 2024-04-20 ENCOUNTER — Telehealth: Payer: Self-pay

## 2024-04-20 NOTE — Patient Outreach (Signed)
 RNCM - second missed initial appointment with RNCM. Also missed appointment with LCSW. Left message to call to reschedule.

## 2024-04-25 ENCOUNTER — Telehealth: Payer: Self-pay | Admitting: Family

## 2024-04-25 NOTE — Telephone Encounter (Signed)
 Called to confirm/remind patient of their appointment at the Advanced [facility] Failure Clinic on 04/26/24.   Appointment:   [x] Confirmed  [] Left mess   [] No answer/No voice mail  [] VM Full/unable to leave message  [] Phone not in service  Patient reminded to bring all medications and/or complete list.  Confirmed patient has transportation. Gave directions, instructed to utilize valet parking.

## 2024-04-25 NOTE — Progress Notes (Deleted)
 ADVANCED HEART FAILURE FOLLOW UP CLINIC NOTE  Referring Physician: Sharma Bullocks*  Primary Care: Sharma Coyer, MD Primary Cardiologist:  HPI: Christopher Orr is a 78 y.o. male with a PMH of COPD, CAD s/p PCI with DES to LAD in 2006, HTN, HLD, chronic heart failure, pericarditis, bipolar, alcohol / tobacco use, CKD, dementia, AF/ RVR, lung & prostate cancer who presents for follow up of chronic systolic heart failure.      Longstanding CAD with prior PCI to LAD in 2006. Echo in 07/2014 with LVEF of 55%. Echo in 04/2018 with normal LVEF. Echo 11/2020 and 09/2021 with normal LVEF and grade I diastolic dysfunction.   Admitted 01/31/24 with chest pain and shortness of breath. On admission, BNP was not measured, HS-troponin was 4107 > 3623, ESR 32, CRP 12, and lactic acid was 0.9. Chest x-ray noted no acute abnormality. ECG with AF RVR and hypotension. R/LHC 02/01/24 and no culprit lesion was found. RHC showed RA of 3, mPAP of 19, PCWP of 12, CO of 5.45, CI of 2.49. Echo 02/02/24: LVEF of 25-30%, moderate LVH, mild MR. Patient was diagnosed with myopericarditis and started on colchicine  and ibuprofen .     SUBJECTIVE:  Patient has not been taking his cardiac meds over the past few days and reports feeling much better. He is no longer dizzy or fatigued and his energy level has been better. He has only been taking his original medications at this point. His daughter does his medications and is mildly overwhelmed given that she has a lot of other responsibilities including multiple small children, we discussed the potential for home health.   PMH, current medications, allergies, social history, and family history reviewed in epic.  PHYSICAL EXAM: There were no vitals filed for this visit.  GENERAL: NDA PULM:  Normal work of breathing, clear to auscultation bilaterally. Respirations are unlabored.  CARDIAC:  JVP: flat         Normal rate with regular rhythm. No murmurs, rubs  or gallops.  No edema. Warm and well perfused extremities. ABDOMEN: Soft, non-tender, non-distended.  - Echo in 07/2014 with LVEF of 55%.  - Echo in 04/2018 with normal LVEF. - Echo 11/2020 and 09/2021 with normal LVEF and grade I diastolic dysfunction. - Echo 02/02/24: LVEF of 25-30%, moderate LVH, mild MR  ASSESSMENT & PLAN:  1: NICM with reduced ejection fraction- -  Likely tachyarrhythmia related though echo suspicious for takotsubo - NYHA class II - euvolemic to dry - Improving off cardiac medications as noted above, given his dementia, as well as suspicion that this is stress related. Given low filling pressures would avoid diuretics - Stop lasix , farxiga , may be able to restart SGLT2 at follow up - Stop digoxin  - Stop lasix  - Continue midodrine  10mg  TID, consider BB in the future - Close follow up - Home health   2: HTN- - BP 103/66, improved - Continue midodrine    3: CAD- - s/p PCI with DES to LAD in 2006 - saw cardiology [name]; bumped general cardiology visit as it is the same day - [facility] 02/01/24 and no culprit lesion was found. RHC showed RA of 3, mPAP of 19, PCWP of 12, CO of 5.45, CI of 2.49.   4: Hyperlipidemia- - continue rosuvastatin  10mg  daily - LDL 02/01/24 was 32 - lipo (a) 02/01/24 was 36.8   5: AF- - In NSR by exam - Restart amiodarone  200mg  daily, would benefit from sinus - Continue apxiaban 5mg  BID, stop plavix    6:  Tobacco use- - smoking 1/2 ppd of cigarettes - complete cessation discussed   7: Myopericarditis- - finishing colchicine  RX - no further chest pain   Follow up in 2 months with APP, will reach out to determine social work options  Morene Brownie, MD Advanced Heart Failure Mechanical Circulatory Support 04/25/24

## 2024-04-26 ENCOUNTER — Encounter: Admitting: Family

## 2024-04-26 ENCOUNTER — Telehealth: Payer: Self-pay | Admitting: Family

## 2024-04-26 NOTE — Telephone Encounter (Signed)
 Patient did not show for his Heart Failure Clinic appointment on 04/26/24.

## 2024-05-02 ENCOUNTER — Encounter: Payer: Self-pay | Admitting: *Deleted

## 2024-05-02 ENCOUNTER — Telehealth: Payer: Self-pay | Admitting: *Deleted

## 2024-05-02 NOTE — Patient Instructions (Signed)
 Christopher Orr - I have attempted to call you three times but have been unsuccessful in reaching you. I work with Sharma Coyer, MD and am calling to support your healthcare needs. If I can be of assistance to you, please contact me at [phone].     Thank you,    Madilynne Mullan, LCSW [place]  [facility], [facility] Licensed Clinical Social Worker  Direct Dial: [phone]

## 2024-05-10 ENCOUNTER — Telehealth

## 2024-05-17 ENCOUNTER — Encounter: Admitting: Family Medicine

## 2024-06-03 ENCOUNTER — Other Ambulatory Visit: Payer: Self-pay | Admitting: Family Medicine

## 2024-06-03 DIAGNOSIS — E861 Hypovolemia: Secondary | ICD-10-CM

## 2024-06-09 ENCOUNTER — Telehealth: Payer: Self-pay | Admitting: Family

## 2024-06-09 NOTE — Telephone Encounter (Signed)
 Called to confirm/remind patient of their appointment at the [facility] on 06/10/24.   Appointment:   [x] Confirmed  [] Left mess   [] No answer/No voice mail  [] VM Full/unable to leave message  [] Phone not in service  Patient reminded to bring all medications and/or complete list.  Confirmed patient has transportation. Gave directions, instructed to utilize valet parking.

## 2024-06-09 NOTE — Progress Notes (Deleted)
 [facility] Note   Referring Physician: admission PCP: Sharma Coyer, MD Cardiologist: Deatrice Cage, MD   Chief Complaint: shortness of breath   HPI:  Mr Fowles is a 78 y/o male with a history of COPD, CAD s/p PCI with DES to LAD in 2006, HTN, HLD, chronic heart failure, pericarditis, bipolar, alcohol / tobacco use, CKD, dementia, AF/ RVR, lung & prostate cancer.   Pertinent cardiac history: Longstanding CAD with prior PCI to LAD in 2006. Echo in 07/2014 with LVEF of 55%. Echo in 04/2018 with normal LVEF. Echo 11/2020 and 09/2021 with normal LVEF and grade I diastolic dysfunction.  Admitted 01/31/24 with chest pain and shortness of breath. On admission, BNP was not measured, HS-troponin was 4107 > 3623, ESR 32, CRP 12, and lactic acid was 0.9. Chest x-ray noted no acute abnormality. ECG with AF RVR and hypotension. R/LHC 02/01/24 and no culprit lesion was found. RHC showed RA of 3, mPAP of 19, PCWP of 12, CO of 5.45, CI of 2.49. Echo 02/02/24: LVEF of 25-30%, moderate LVH, mild MR. Patient was diagnosed with myopericarditis and started on colchicine  and ibuprofen .   He presents today, with his son/ DIL, for his initial HF visit with a chief complaint of shortness of breath. Has associated fatigue, dizziness, decreased appetite. Sleeping well on 1 pillow. Denies chest pain, palpitations, abdominal distention, pedal edema or abdominal pain. DIL's biggest concern is of his decreased appetite and dizziness that he's having. Midodrine  was increased yesterday by PCP but he's only had 1 dose of midodrine  today. Dizziness and decreased appetite have only been present since recent discharge.   Smoking 1/2 ppd cigarettes daily. Denies alcohol  or drug use.   Review of Systems: [y] = yes, [ ]  = no   General: Weight gain [ ] ; Weight loss [ ] ; Anorexia [ ] ; Fatigue davis.dad ]; Fever [ ] ; Chills [ ] ; Weakness [ ]   Cardiac: Chest pain/pressure [ ] ; Resting SOB [ ] ; Exertional SOB davis.dad  ]; Orthopnea [ ] ; Pedal Edema [ ] ; Palpitations [ ] ; Syncope [ ] ; Presyncope [ ] ; Paroxysmal nocturnal dyspnea[ ]   Pulmonary: Cough [ ] ; Wheezing[ ] ; Hemoptysis[ ] ; Sputum [ ] ; Snoring [ ]   GI: Decreased appetite[y]; Dysphagia[ ] ; Melena[ ] ; Hematochezia [ ] ; Heartburn[ ] ; Abdominal pain [ ] ; Constipation [ ] ; Diarrhea [ ] ; BRBPR [ ]   GU: Hematuria[ ] ; Dysuria [ ] ; Nocturia[ ]   Vascular: Pain in legs with walking [ ] ; Pain in feet with lying flat [ ] ; Non-healing sores [ ] ; Stroke [ ] ; TIA [ ] ; Slurred speech [ ] ;  Neuro: Dizziness[y ]; Vertigo[ ] ; Seizures[ ] ; Paresthesias[ ] ;Blurred vision [ ] ; Diplopia [ ] ; Vision changes [ ]   Ortho/Skin: Arthritis [ ] ; Joint pain [ ] ; Muscle pain [ ] ; Joint swelling [ ] ; Back Pain [ ] ; Rash [ ]   Psych: Depression[ ] ; Anxiety[ ]   Heme: Bleeding problems [ ] ; Clotting disorders [ ] ; Anemia [ ]   Endocrine: Diabetes [ ] ; Thyroid  dysfunction[ ]    Past Medical History:  Diagnosis Date   Abnormal findings on diagnostic imaging of digestive system    Acute kidney injury 07/28/2014   Alcohol  abuse    pt states it is marijuana   Anemia    Arthritis    Arthritis 08/15/2019   Aspiration pneumonia (HCC) 07/28/2014   Bilateral inguinal hernia, without obstruction or gangrene, recurrent    Bipolar disorder (HCC)    BPH (benign prostatic hypertrophy) with urinary obstruction    CAD (coronary  artery disease)    a. 2006 PCI/DES to LAD, EF 60%.   Chest pain 07/28/2014   Chronic heart failure with preserved ejection fraction (HFpEF) (HCC)    a. 07/2014 Echo: EF 55%; b. 11/2020 Echo: EF 50-55%, mod LVH, GrI DD, mildly enlarged RV w/ nl fxn, mild BAE.   Chronic pancreatitis (HCC)    Chronic viral hepatitis C (HCC) 09/24/2014   Overview:   GT 1a.   started on Harvoni 11/03/2014 for planned 12 week course, elastography shows cirrhosis.   Cigarette smoker 08/05/2018   Cirrhosis (HCC)    Cirrhosis of liver (HCC) 08/15/2019   Noted in 2016 imaging with alcohol  abuse and h/o  hep C tx s/p harvoni       COPD (chronic obstructive pulmonary disease) (HCC)    Coronary artery disease involving native heart with angina pectoris 05/31/2018   COVID-19    08/22/20   Dementia (HCC)    early dementia   Depression    Depression, recurrent 06/14/2018   Dilated ascending aorta and aortic root (HCC)    a. 11/2020 Echo: Ao root 44mm, Asc Ao 40mm. Ao arch 37mm.   Diverticulitis    12/06/20 [facility]    Diverticulosis    Drug use    Dyspnea    ED (erectile dysfunction)    Elevated troponin I level 07/28/2014   Gastritis    GERD (gastroesophageal reflux disease)    Headache    occasional migraines   Hepatitis C    treated   Hernia, inguinal, bilateral 08/15/2019   Recurrent s/p repair       History of lumbar fusion    History of pancreatitis 08/15/2019   X 2    History of prostate cancer 08/15/2019   2017 s/p brachytherapy with seeds   F/u Dr. Penne       Hyperlipidemia    Hypertension    patient denies having high blood pressure   Internal hemorrhoids 01/29/2021   Leg edema 01/03/2022   Lung cancer (HCC)    Malignant neoplasm of lower lobe of left lung (HCC) 05/15/2020   Memory loss 08/15/2019   Nodule of lower lobe of left lung 11/09/2019   Nodule of middle lobe of right lung 08/15/2019   Other chronic pain 01/29/2021   Pancreatitis    x 2    Pneumonia    07/06/18    Polyp of transverse colon    Poor nutrition 06/09/2022   Premature atrial contractions    a. 04/2021 Zio: Freq PACs w/ 8% burden.   Prostate cancer (HCC)    with seeds   PSVT (paroxysmal supraventricular tachycardia)    a. 04/2021 Zio: Avg HR 80 (60-210). 42 SVT runs - longest 5 beats @ 116, fastest 210 x 5 beats. Freq PACs (8%).   PTSD (post-traumatic stress disorder)    Rib fracture    s/p fall 03/15/19    Right ear pain 07/18/2021   S/P lumbar fusion 12/27/2020   Sinus bradycardia 01/29/2021   Sinus disease    SOB (shortness of breath) on exertion 05/15/2020   Squamous carcinoma of  lung, left (HCC) 11/26/2019   Syncope 01/29/2021   Syncope due to orthostatic hypotension 01/23/2021   Umbilical hernia without obstruction and without gangrene    Urge incontinence    Viral upper respiratory tract infection 07/18/2021    Current Outpatient Medications  Medication Sig Dispense Refill   amiodarone  (PACERONE ) 200 MG tablet Take 1 tablet (200 mg total) by mouth 2 (two)  times daily for 10 days, THEN 1 tablet (200 mg total) daily. 60 tablet 1   apixaban  (ELIQUIS ) 5 MG TABS tablet Take 1 tablet (5 mg total) by mouth 2 (two) times daily. 180 tablet 1   ARIPiprazole  (ABILIFY ) 10 MG tablet TAKE 1 TABLET BY MOUTH EVERY NIGHT AT BEDTIME 90 tablet 2   colchicine  0.6 MG tablet TAKE 1 TABLET BY MOUTH 2 TIMES DAILY. 180 tablet 1   famotidine  (PEPCID ) 20 MG tablet Take 1 tablet (20 mg total) by mouth daily. 90 tablet 1   FLUoxetine  (PROZAC ) 10 MG capsule Take 3 capsules (30 mg total) by mouth daily. 270 capsule 1   FLUoxetine  (PROZAC ) 20 MG capsule TAKE 1 CAPSULE BY MOUTH DAILY (Patient taking differently: Take 20 mg by mouth daily. Take 20 mg capsule by mouth along with one 10 mg capsule for total 30 mg once daily) 28 capsule 12   memantine  (NAMENDA ) 10 MG tablet Take 1 tablet (10 mg total) by mouth 2 (two) times daily. 180 tablet 3   midodrine  (PROAMATINE ) 10 MG tablet TAKE 1 TABLET BY MOUTH 3 TIMES DAILY WITH MEALS. 270 tablet 0   mirabegron  ER (MYRBETRIQ ) 50 MG TB24 tablet Take 1 tablet (50 mg total) by mouth daily. 28 tablet 12   pantoprazole  (PROTONIX ) 40 MG tablet TAKE 1 TABLET BY MOUTH EVERY IN THE MORNING 30 MINUTES BEFORE BREAKFAST 90 tablet 1   rosuvastatin  (CRESTOR ) 10 MG tablet Take 1 tablet (10 mg total) by mouth at bedtime. 90 tablet 1   sucralfate  (CARAFATE ) 1 g tablet Take 1 tablet (1 g total) by mouth 4 (four) times daily. 120 tablet 2   tamsulosin  (FLOMAX ) 0.4 MG CAPS capsule Take 1 capsule (0.4 mg total) by mouth daily. 90 capsule 3   traZODone  (DESYREL ) 100 MG tablet  Take 1 tablet (100 mg total) by mouth at bedtime as needed. for sleep 90 tablet 1   No current facility-administered medications for this visit.    No Known Allergies    Social History   Socioeconomic History   Marital status: Divorced    Spouse name: Not on file   Number of children: 4   Years of education: Not on file   Highest education level: GED or equivalent  Occupational History   Occupation: curator    Comment: retired  Tobacco Use   Smoking status: Every Day    Current packs/day: 1.50    Average packs/day: 1.5 packs/day for 61.9 years (92.8 ttl pk-yrs)    Types: Cigarettes    Start date: 07/28/1962   Smokeless tobacco: Never   Tobacco comments:    1PPD 01/31/2024  Vaping Use   Vaping status: Never Used  Substance and Sexual Activity   Alcohol  use: Yes    Alcohol /week: 1.0 standard drink of alcohol     Types: 1 Cans of beer per week    Comment: occassional beer   Drug use: Yes    Frequency: 7.0 times per week    Types: Marijuana    Comment: Endorsed heroin 06/2014, UDS also + benzos, opiates, THC, neg for cocaine at that time.   Sexual activity: Not Currently  Other Topics Concern   Not on file  Social History Narrative   Lives at home    Has kids and grandkids   Smoker x 55 years 1 ppd as of 07/2019 he did quit x 2 years    Drinks 1 pt liq qd as of 07/2019    Social Drivers of  Health   Financial Resource Strain: High Risk (02/08/2024)   Overall Financial Resource Strain (CARDIA)    Difficulty of Paying Living Expenses: Very hard  Food Insecurity: Food Insecurity Present (02/05/2024)   Hunger Vital Sign    Worried About Running Out of Food in the Last Year: Sometimes true    Ran Out of Food in the Last Year: Sometimes true  Transportation Needs: No Transportation Needs (02/05/2024)   PRAPARE - Administrator, Civil Service (Medical): No    Lack of Transportation (Non-Medical): No  Physical Activity: Inactive (12/25/2017)   Exercise  Vital Sign    Days of Exercise per Week: 0 days    Minutes of Exercise per Session: 0 min  Stress: No Stress Concern Present (02/08/2024)   [employer] of Occupational Health - Occupational Stress Questionnaire    Feeling of Stress: Not at all  Social Connections: Socially Isolated (01/31/2024)   Social Connection and Isolation Panel    Frequency of Communication with Friends and Family: More than three times a week    Frequency of Social Gatherings with Friends and Family: Once a week    Attends Religious Services: Never    Database Administrator or Organizations: No    Attends Banker Meetings: Never    Marital Status: Divorced  Catering Manager Violence: Not At Risk (02/05/2024)   Humiliation, Afraid, Rape, and Kick questionnaire    Fear of Current or Ex-Partner: No    Emotionally Abused: No    Physically Abused: No    Sexually Abused: No      Family History  Problem Relation Age of Onset   Heart disease Father        Unclear details. Father passed in late 99's 2/2 cancer.   Colon cancer Father    Alcohol  abuse Sister    Drug abuse Sister    Anxiety disorder Sister    Depression Sister    COPD Brother    Obesity Brother    Kidney disease Neg Hx    Prostate cancer Neg Hx    There were no vitals filed for this visit.  Wt Readings from Last 3 Encounters:  03/14/24 215 lb (97.5 kg)  03/09/24 211 lb (95.7 kg)  03/02/24 211 lb (95.7 kg)   Lab Results  Component Value Date   CREATININE 1.21 02/04/2024   CREATININE 1.10 02/03/2024   CREATININE 0.97 02/02/2024    PHYSICAL EXAM:  General: Elderly male in wheelchair. No resp difficulty HEENT: normal Neck: supple, no JVD Cor: Regular rhythm, rate. No rubs, gallops or murmurs Lungs: clear Abdomen: soft, nontender, nondistended. Extremities: no cyanosis, clubbing, rash, edema Neuro: alert & oriented X 3. Moves all 4 extremities w/o difficulty. Affect pleasant   ECG: NSR, HR 73 (personally  reviewed)   ASSESSMENT & PLAN:  1: NICM with reduced ejection fraction- - suspect due to AF as LHC showed no culprit lesion - NYHA class II - euvolemic - scales given and instructed to weight daily and parameters to call about reviewed - Echo in 07/2014 with LVEF of 55%.  - Echo in 04/2018 with normal LVEF. - Echo 11/2020 and 09/2021 with normal LVEF and grade I diastolic dysfunction. - Echo 02/02/24: LVEF of 25-30%, moderate LVH, mild MR - continue farxiga  10mg  daily - continue digoxin  0.125mg  daily ; will get dig level - change furosemide  to 20mg  daily PRN for above weight gain/ edema. Hopefully this will also help raise BP - stop losartan  due to  BP - current BP will not tolerate GDMT titration  2: HTN- - BP 89/59, repeat 93/58 - stopping losartan  and changing furosemide  to PRN - emphasized making sure he's drinking enough fluids (60-64 oz) - saw PCP Simmons-Robinson (07/25) & midodrine  was increased to 10mg  daily due to hypotension (hasn't taken his 2nd dose yet today) - BMET 02/04/24 reviewed: sodium 138, potassium 3.8, creatinine 1.21 & GFR >60  3: CAD- - s/p PCI with DES to LAD in 2006 - saw cardiology [name]; returns 07/25 - R/LHC 02/01/24 and no culprit lesion was found. RHC showed RA of 3, mPAP of 19, PCWP of 12, CO of 5.45, CI of 2.49.  4: Hyperlipidemia- - continue rosuvastatin  10mg  daily - LDL 02/01/24 was 32 - lipo (a) 02/01/24 was 36.8  5: AF- - EKG today is NSR - continue amiodarone  200mg  BID till 02/14/24, then decrease down to 200mg  daily - continue apixaban  5mg  BID  6: Tobacco use- - smoking 1/2 ppd of cigarettes - complete cessation discussed  7: Myopericarditis- - finishing colchicine  RX - no further chest pain    Return in 2 weeks to see HF MD, sooner if needed.   Ellouise DELENA Class, FNP 06/09/24

## 2024-06-10 ENCOUNTER — Telehealth: Payer: Self-pay | Admitting: Family

## 2024-06-10 ENCOUNTER — Ambulatory Visit: Admitting: Family

## 2024-06-10 NOTE — Telephone Encounter (Signed)
 Patient did not show for his [facility] appointment on 06/10/24.

## 2024-07-19 ENCOUNTER — Ambulatory Visit

## 2024-07-19 DIAGNOSIS — Z0000 Encounter for general adult medical examination without abnormal findings: Secondary | ICD-10-CM | POA: Diagnosis not present

## 2024-07-19 NOTE — Patient Instructions (Addendum)
 Christopher Orr,  Thank you for taking the time for your Medicare Wellness Visit. I appreciate your continued commitment to your health goals. Please review the care plan we discussed, and feel free to reach out if I can assist you further.  Please note that Annual Wellness Visits do not include a physical exam. Some assessments may be limited, especially if the visit was conducted virtually. If needed, we may recommend an in-person follow-up with your provider.  Ongoing Care Seeing your primary care provider every 3 to 6 months helps us  monitor your health and provide consistent, personalized care.   Referrals If a referral was made during today's visit and you haven't received any updates within two weeks, please contact the referred provider directly to check on the status.  Recommended Screenings:  Health Maintenance  Topic Date Due   DTaP/Tdap/Td vaccine (1 - Tdap) Never done   Zoster (Shingles) Vaccine (1 of 2) Never done   Flu Shot  02/26/2024   COVID-19 Vaccine (4 - 2025-26 season) 03/28/2024   Medicare Annual Wellness Visit  07/19/2025   Pneumococcal Vaccine for age over 56  Completed   Hepatitis C Screening  Completed   Meningitis B Vaccine  Aged Out   Colon Cancer Screening  Discontinued     Vision: Annual vision screenings are recommended for early detection of glaucoma, cataracts, and diabetic retinopathy. These exams can also reveal signs of chronic conditions such as diabetes and high blood pressure.  Dental: Annual dental screenings help detect early signs of oral cancer, gum disease, and other conditions linked to overall health, including heart disease and diabetes.  Please see the attached documents for additional preventive care recommendations.   NEXT AWV 07/25/25 @ 10:50 AM IN PERSON

## 2024-07-19 NOTE — Progress Notes (Signed)
 "  Chief Complaint  Patient presents with   Medicare Wellness     Subjective:   TAJI BARRETTO is a 78 y.o. male who presents for a Medicare Annual Wellness Visit.  Visit info / Clinical Intake: Medicare Wellness Visit Type:: Subsequent Annual Wellness Visit Persons participating in visit and providing information:: patient Medicare Wellness Visit Mode:: Telephone If telephone:: video declined Since this visit was completed virtually, some vitals may be partially provided or unavailable. Missing vitals are due to the limitations of the virtual format.: Unable to obtain vitals - no equipment If Telephone or Video please confirm:: I connected with patient using audio/video enable telemedicine. I verified patient identity with two identifiers, discussed telehealth limitations, and patient agreed to proceed. Patient Location:: home Provider Location:: office Interpreter Needed?: No Pre-visit prep was completed: yes AWV questionnaire completed by patient prior to visit?: no Living arrangements:: (!) lives alone Patient's Overall Health Status Rating: good Typical amount of pain: none Does pain affect daily life?: no Are you currently prescribed opioids?: no  Dietary Habits and Nutritional Risks How many meals a day?: 2 Eats fruit and vegetables daily?: (!) no Most meals are obtained by: preparing own meals In the last 2 weeks, have you had any of the following?: none Diabetic:: no  Functional Status Activities of Daily Living (to include ambulation/medication): Independent Ambulation: Independent Medication Administration: Independent Home Management (perform basic housework or laundry): Independent Manage your own finances?: (!) no Primary transportation is: driving; family / friends Concerns about vision?: no *vision screening is required for WT[facility]* (READERS-) Concerns about hearing?: no  Fall Screening Falls in the past year?: 0 Number of falls in past year: 0 Was there an  injury with Fall?: 0 Fall Risk Category Calculator: 0 Patient Fall Risk Level: Low Fall Risk  Fall Risk Patient at Risk for Falls Due to: No Fall Risks Fall risk Follow up: Falls evaluation completed; Falls prevention discussed  Home and Transportation Safety: All rugs have non-skid backing?: yes All stairs or steps have railings?: (!) no Grab bars in the bathtub or shower?: (!) no Have non-skid surface in bathtub or shower?: yes Good home lighting?: yes Regular seat belt use?: yes Hospital stays in the last year:: no  Cognitive Assessment Difficulty concentrating, remembering, or making decisions? : yes (MEMORY) Will 6CIT or Mini Cog be Completed: yes What year is it?: 0 points What month is it?: 0 points Give patient an address phrase to remember (5 components): [address] [place], [place] what time is it?: 0 points Count backwards from 20 to 1: 0 points Say the months of the year in reverse: 0 points Repeat the address phrase from earlier: 4 points 6 CIT Score: 4 points  Advance Directives (For Healthcare) Does Patient Have a Medical Advance Directive?: No Would patient like information on creating a medical advance directive?: No - Patient declined  Reviewed/Updated  Reviewed/Updated: Reviewed All (Medical, Surgical, Family, Medications, Allergies, Care Teams, Patient Goals)    Allergies (verified) Patient has no known allergies.   Current Medications (verified) Outpatient Encounter Medications as of 07/19/2024  Medication Sig   amiodarone  (PACERONE ) 200 MG tablet Take 1 tablet (200 mg total) by mouth 2 (two) times daily for 10 days, THEN 1 tablet (200 mg total) daily. (Patient not taking: No sig reported)   apixaban  (ELIQUIS ) 5 MG TABS tablet Take 1 tablet (5 mg total) by mouth 2 (two) times daily. (Patient not taking: Reported on 07/19/2024)   ARIPiprazole  (ABILIFY ) 10  MG tablet TAKE 1 TABLET BY MOUTH EVERY NIGHT AT BEDTIME (Patient not taking: Reported on  07/19/2024)   colchicine  0.6 MG tablet TAKE 1 TABLET BY MOUTH 2 TIMES DAILY. (Patient not taking: Reported on 07/19/2024)   famotidine  (PEPCID ) 20 MG tablet Take 1 tablet (20 mg total) by mouth daily. (Patient not taking: Reported on 07/19/2024)   FLUoxetine  (PROZAC ) 10 MG capsule Take 3 capsules (30 mg total) by mouth daily.   FLUoxetine  (PROZAC ) 20 MG capsule TAKE 1 CAPSULE BY MOUTH DAILY (Patient not taking: Reported on 07/19/2024)   memantine  (NAMENDA ) 10 MG tablet Take 1 tablet (10 mg total) by mouth 2 (two) times daily. (Patient not taking: Reported on 07/19/2024)   midodrine  (PROAMATINE ) 10 MG tablet TAKE 1 TABLET BY MOUTH 3 TIMES DAILY WITH MEALS. (Patient not taking: Reported on 07/19/2024)   mirabegron  ER (MYRBETRIQ ) 50 MG TB24 tablet Take 1 tablet (50 mg total) by mouth daily. (Patient not taking: Reported on 07/19/2024)   pantoprazole  (PROTONIX ) 40 MG tablet TAKE 1 TABLET BY MOUTH EVERY IN THE MORNING 30 MINUTES BEFORE BREAKFAST (Patient not taking: Reported on 07/19/2024)   rosuvastatin  (CRESTOR ) 10 MG tablet Take 1 tablet (10 mg total) by mouth at bedtime. (Patient not taking: Reported on 07/19/2024)   sucralfate  (CARAFATE ) 1 g tablet Take 1 tablet (1 g total) by mouth 4 (four) times daily. (Patient not taking: Reported on 07/19/2024)   tamsulosin  (FLOMAX ) 0.4 MG CAPS capsule Take 1 capsule (0.4 mg total) by mouth daily. (Patient not taking: Reported on 07/19/2024)   traZODone  (DESYREL ) 100 MG tablet Take 1 tablet (100 mg total) by mouth at bedtime as needed. for sleep (Patient not taking: Reported on 07/19/2024)   No facility-administered encounter medications on file as of 07/19/2024.    History: Past Medical History:  Diagnosis Date   Abnormal findings on diagnostic imaging of digestive system    Acute kidney injury 07/28/2014   Alcohol  abuse    pt states it is marijuana   Anemia    Arthritis    Arthritis 08/15/2019   Aspiration pneumonia (HCC) 07/28/2014   Bilateral  inguinal hernia, without obstruction or gangrene, recurrent    Bipolar disorder (HCC)    BPH (benign prostatic hypertrophy) with urinary obstruction    CAD (coronary artery disease)    a. 2006 PCI/DES to LAD, EF 60%.   Chest pain 07/28/2014   Chronic heart failure with preserved ejection fraction (HFpEF) (HCC)    a. 07/2014 Echo: EF 55%; b. 11/2020 Echo: EF 50-55%, mod LVH, GrI DD, mildly enlarged RV w/ nl fxn, mild BAE.   Chronic pancreatitis (HCC)    Chronic viral hepatitis C (HCC) 09/24/2014   Overview:   GT 1a.   started on Harvoni 11/03/2014 for planned 12 week course, elastography shows cirrhosis.   Cigarette smoker 08/05/2018   Cirrhosis (HCC)    Cirrhosis of liver (HCC) 08/15/2019   Noted in 2016 imaging with alcohol  abuse and h/o hep C tx s/p harvoni       COPD (chronic obstructive pulmonary disease) (HCC)    Coronary artery disease involving native heart with angina pectoris 05/31/2018   COVID-19    08/22/20   Dementia (HCC)    early dementia   Depression    Depression, recurrent 06/14/2018   Dilated ascending aorta and aortic root (HCC)    a. 11/2020 Echo: Ao root 44mm, Asc Ao 40mm. Ao arch 37mm.   Diverticulitis    12/06/20 [facility]    Diverticulosis  Drug use    Dyspnea    ED (erectile dysfunction)    Elevated troponin I level 07/28/2014   Gastritis    GERD (gastroesophageal reflux disease)    Headache    occasional migraines   Hepatitis C    treated   Hernia, inguinal, bilateral 08/15/2019   Recurrent s/p repair       History of lumbar fusion    History of pancreatitis 08/15/2019   X 2    History of prostate cancer 08/15/2019   2017 s/p brachytherapy with seeds   F/u Dr. Penne       Hyperlipidemia    Hypertension    patient denies having high blood pressure   Internal hemorrhoids 01/29/2021   Leg edema 01/03/2022   Lung cancer (HCC)    Malignant neoplasm of lower lobe of left lung (HCC) 05/15/2020   Memory loss 08/15/2019   Nodule of lower lobe of left  lung 11/09/2019   Nodule of middle lobe of right lung 08/15/2019   Other chronic pain 01/29/2021   Pancreatitis    x 2    Pneumonia    07/06/18    Polyp of transverse colon    Poor nutrition 06/09/2022   Premature atrial contractions    a. 04/2021 [facility]: Freq PACs w/ 8% burden.   Prostate cancer (HCC)    with seeds   PSVT (paroxysmal supraventricular tachycardia)    a. 04/2021 [facility]: Avg HR 80 (60-210). 42 SVT runs - longest 5 beats @ 116, fastest 210 x 5 beats. Freq PACs (8%).   PTSD (post-traumatic stress disorder)    Rib fracture    s/p fall 03/15/19    Right ear pain 07/18/2021   S/P lumbar fusion 12/27/2020   Sinus bradycardia 01/29/2021   Sinus disease    SOB (shortness of breath) on exertion 05/15/2020   Squamous carcinoma of lung, left (HCC) 11/26/2019   Syncope 01/29/2021   Syncope due to orthostatic hypotension 01/23/2021   Umbilical hernia without obstruction and without gangrene    Urge incontinence    Viral upper respiratory tract infection 07/18/2021   Past Surgical History:  Procedure Laterality Date   CARDIAC CATHETERIZATION  2006   cateract  2013   COLONOSCOPY WITH PROPOFOL  N/A 10/04/2019   Procedure: COLONOSCOPY WITH PROPOFOL ;  Surgeon: Jinny Carmine, MD;  Location: [facility];  Service: Endoscopy;  Laterality: N/A;   CORONARY ANGIOPLASTY  2006   CORONARY STENT PLACEMENT  2006   x 1   CYSTOSCOPY W/ RETROGRADES N/A 08/08/2021   Procedure: CYSTOSCOPY WITH RETROGRADE PYELOGRAM;  Surgeon: Penne Knee, MD;  Location: [facility] OR[facility];  Service: Urology;  Laterality: N/A;   CYSTOSCOPY WITH URETHRAL DILATATION N/A 08/08/2021   Procedure: CYSTOSCOPY WITH URETHRAL DILATATION WITH UROLUME BALLOON;  Surgeon: Penne Knee, MD;  Location: [facility];  Service: Urology;  Laterality: N/A;   ESOPHAGOGASTRODUODENOSCOPY N/A 12/22/2014   Procedure: ESOPHAGOGASTRODUODENOSCOPY (EGD);  Surgeon: Donnice Vaughn Manes, MD;  Location: [facility] ENDOSCOPY;  Service: Endoscopy;  Laterality: N/A;    ESOPHAGOGASTRODUODENOSCOPY (EGD) WITH PROPOFOL  N/A 10/04/2019   Procedure: ESOPHAGOGASTRODUODENOSCOPY (EGD) WITH PROPOFOL ;  Surgeon: Jinny Carmine, MD;  Location: [facility] END[facility]COPY;  Service: Endoscopy;  Laterality: N/A;   HERNIA REPAIR  2015   left groin   INTERCOSTAL NERVE BLOCK Left 11/09/2019   Procedure: Intercostal Nerve Block;  Surgeon: Kerrin Elspeth BROCKS, MD;  Location: [facility] OR;  Service: Thoracic;  Laterality: Left;   LUMBAR FUSION     RADIOACTIVE SEED IMPLANT N/A 04/22/2016   Procedure: RADIOACTIVE  SEED IMPLANT/BRACHYTHERAPY IMPLANT;  Surgeon: Rosina Riis, MD;  Location: [facility] ORS;  Service: Urology;  Laterality: N/A;   RIGHT/LEFT HEART CATH AND CORONARY ANGIOGRAPHY N/A 02/01/2024   Procedure: RIGHT/LEFT HEART CATH AND CORONARY ANGIOGRAPHY;  Surgeon: Anner Alm ORN, MD;  Location: [facility];  Service: Cardiovascular;  Laterality: N/A;   ROBOT ASSISTED INGUINAL HERNIA REPAIR Bilateral 07/06/2018   Procedure: ROBOT ASSISTED INGUINAL HERNIA REPAIR;  Surgeon: Jordis Laneta FALCON, MD;  Location: [facility] ORS;  Service: General;  Laterality: Bilateral;   TONSILLECTOMY     UMBILICAL HERNIA REPAIR N/A 07/06/2018   Procedure: LAPAROSCOPIC ROBOT ASSISTED UMBILICAL HERNIA;  Surgeon: Jordis Laneta FALCON, MD;  Location: [facility];  Service: General;  Laterality: N/A;   XI ROBOTIC ASSISTED THORACOSCOPY- SEGMENTECTOMY Left 11/09/2019   Procedure: XI ROBOTIC ASSISTED THORACOSCOPY-WEDGE RESECTION LEFT LOWER LOBE;  Surgeon: Kerrin Elspeth BROCKS, MD;  Location: [facility] OR;  Service: Thoracic;  Laterality: Left;   Family History  Problem Relation Age of Onset   Heart disease Father        Unclear details. Father passed in late 87's 2/2 cancer.   Colon cancer Father    Alcohol  abuse Sister    Drug abuse Sister    Anxiety disorder Sister    Depression Sister    COPD Brother    Obesity Brother    Kidney disease Neg Hx    Prostate cancer Neg Hx    Social History   Occupational History   Occupation: animal nutritionist    Comment: retired  Tobacco Use   Smoking status: Every Day    Current packs/day: 1.50    Average packs/day: 1.5 packs/day for 62.0 years (93.0 ttl pk-yrs)    Types: Cigarettes    Start date: 07/28/1962   Smokeless tobacco: Never   Tobacco comments:    1PPD 01/31/2024  Vaping Use   Vaping status: Never Used  Substance and Sexual Activity   Alcohol  use: Yes    Alcohol /week: 1.0 standard drink of alcohol     Types: 1 Cans of beer per week    Comment: occassional beer   Drug use: Yes    Frequency: 7.0 times per week    Types: Marijuana    Comment: Endorsed heroin 06/2014, UDS also + benzos, opiates, THC, neg for cocaine at that time.   Sexual activity: Not Currently   Tobacco Counseling Ready to quit: Not Answered Counseling given: Not Answered Tobacco comments: 1PPD 01/31/2024  SDOH Screenings   Food Insecurity: No Food Insecurity (07/19/2024)  Housing: Unknown (07/19/2024)  Transportation Needs: No Transportation Needs (07/19/2024)  Utilities: Not At Risk (07/19/2024)  Alcohol  Screen: Low Risk (02/08/2024)  Depression (PHQ2-9): Low Risk (07/19/2024)  Financial Resource Strain: High Risk (02/08/2024)  Physical Activity: Inactive (07/19/2024)  Social Connections: Socially Isolated (07/19/2024)  Stress: No Stress Concern Present (07/19/2024)  Tobacco Use: High Risk (07/19/2024)  Health Literacy: Adequate Health Literacy (07/19/2024)   See flowsheets for full screening details  Depression Screen PHQ 2 & 9 Depression Scale- Over the past 2 weeks, how often have you been bothered by any of the following problems? Little interest or pleasure in doing things: 0 Feeling down, depressed, or hopeless (PHQ Adolescent also includes...irritable): 0 PHQ-2 Total Score: 0 Trouble falling or staying asleep, or sleeping too much: 0 Feeling tired or having little energy: 0 Poor appetite or overeating (PHQ Adolescent also includes...weight loss): 0 Feeling bad about yourself -  or that you are a failure or have let yourself or your  family down: 0 Trouble concentrating on things, such as reading the newspaper or watching television [facility] Adolescent also includes...like school work): 0 Moving or speaking so slowly that other people could have noticed. Or the opposite - being so fidgety or restless that you have been moving around a lot more than usual: 0 Thoughts that you would be better off dead, or of hurting yourself in some way: 0 PHQ-9 Total Score: 0 If you checked off any problems, how difficult have these problems made it for you to do your work, take care of things at home, or get along with other people?: Not difficult at all  Depression Treatment Depression Interventions/Treatment : EYV7-0 Score <4 Follow-up Not Indicated     Goals Addressed             This Visit's Progress    DIET - EAT MORE FRUITS AND VEGETABLES               Objective:    There were no vitals filed for this visit. There is no height or weight on file to calculate BMI.  Hearing/Vision screen Hearing Screening - Comments:: NO AIDS Vision Screening - Comments:: READERS- NO MD Immunizations and Health Maintenance Health Maintenance  Topic Date Due   DTaP/Tdap/Td (1 - Tdap) Never done   Zoster Vaccines- Shingrix (1 of 2) Never done   Influenza Vaccine  02/26/2024   COVID-19 Vaccine (4 - 2025-26 season) 03/28/2024   Medicare Annual Wellness (AWV)  07/19/2025   Pneumococcal Vaccine: 50+ Years  Completed   Hepatitis C Screening  Completed   Meningococcal B Vaccine  Aged Out   Colonoscopy  Discontinued        Assessment/Plan:  This is a routine wellness examination for Lyman.  Patient Care Team: Sharma Coyer, MD as PCP - General (Family Medicine) Darron Deatrice LABOR, MD as PCP - Cardiology (Cardiology) Cindie Ole DASEN, MD as PCP - Electrophysiology (Cardiology) Verdene Gills, RN as Oncology Nurse Navigator  I have personally reviewed and noted the  following in the patients chart:   Medical and social history Use of alcohol , tobacco or illicit drugs  Current medications and supplements including opioid prescriptions. Functional ability and status Nutritional status Physical activity Advanced directives List of other physicians Hospitalizations, surgeries, and ER visits in previous 12 months Vitals Screenings to include cognitive, depression, and falls Referrals and appointments  No orders of the defined types were placed in this encounter.  In addition, I have reviewed and discussed with patient certain preventive protocols, quality metrics, and best practice recommendations. A written personalized care plan for preventive services as well as general preventive health recommendations were provided to patient.   Jhonnie GORMAN Das, LPN   [id number]   Return in 1 year (on 07/19/2025).  After Visit Summary: (In Person-Declined) Patient declined AVS at this time.  Nurse Notes: NEED FLU SHOT, TDAP, SHINGRIX; AGED OUT OF COLONOSCOPY   "

## 2025-07-25 ENCOUNTER — Ambulatory Visit
